# Patient Record
Sex: Female | Born: 1945 | Race: White | Hispanic: No | Marital: Married | Smoking: Never smoker
Health system: Southern US, Community
[De-identification: ages and names within clinical notes are randomized; demographics above are authoritative.]

## PROBLEM LIST (undated history)

## (undated) DIAGNOSIS — I482 Chronic atrial fibrillation, unspecified: Secondary | ICD-10-CM

## (undated) DIAGNOSIS — J9601 Acute respiratory failure with hypoxia: Secondary | ICD-10-CM

## (undated) DIAGNOSIS — M1990 Unspecified osteoarthritis, unspecified site: Secondary | ICD-10-CM

## (undated) DIAGNOSIS — Z8781 Personal history of (healed) traumatic fracture: Secondary | ICD-10-CM

## (undated) DIAGNOSIS — M75102 Unspecified rotator cuff tear or rupture of left shoulder, not specified as traumatic: Secondary | ICD-10-CM

## (undated) DIAGNOSIS — N200 Calculus of kidney: Secondary | ICD-10-CM

## (undated) DIAGNOSIS — M961 Postlaminectomy syndrome, not elsewhere classified: Secondary | ICD-10-CM

## (undated) DIAGNOSIS — N390 Urinary tract infection, site not specified: Secondary | ICD-10-CM

## (undated) DIAGNOSIS — R319 Hematuria, unspecified: Secondary | ICD-10-CM

## (undated) DIAGNOSIS — M51379 Other intervertebral disc degeneration, lumbosacral region without mention of lumbar back pain or lower extremity pain: Secondary | ICD-10-CM

## (undated) DIAGNOSIS — M810 Age-related osteoporosis without current pathological fracture: Secondary | ICD-10-CM

## (undated) DIAGNOSIS — E119 Type 2 diabetes mellitus without complications: Secondary | ICD-10-CM

## (undated) DIAGNOSIS — M75101 Unspecified rotator cuff tear or rupture of right shoulder, not specified as traumatic: Secondary | ICD-10-CM

## (undated) DIAGNOSIS — M48061 Spinal stenosis, lumbar region without neurogenic claudication: Secondary | ICD-10-CM

## (undated) DIAGNOSIS — I440 Atrioventricular block, first degree: Secondary | ICD-10-CM

## (undated) DIAGNOSIS — E669 Obesity, unspecified: Secondary | ICD-10-CM

## (undated) DIAGNOSIS — R0600 Dyspnea, unspecified: Secondary | ICD-10-CM

## (undated) DIAGNOSIS — M5137 Other intervertebral disc degeneration, lumbosacral region: Secondary | ICD-10-CM

## (undated) DIAGNOSIS — G5603 Carpal tunnel syndrome, bilateral upper limbs: Secondary | ICD-10-CM

## (undated) DIAGNOSIS — I5032 Chronic diastolic (congestive) heart failure: Secondary | ICD-10-CM

## (undated) DIAGNOSIS — Z87442 Personal history of urinary calculi: Secondary | ICD-10-CM

## (undated) DIAGNOSIS — N201 Calculus of ureter: Secondary | ICD-10-CM

## (undated) DIAGNOSIS — R7303 Prediabetes: Secondary | ICD-10-CM

## (undated) HISTORY — PX: FINGER ARTHROSCOPY WITH CARPOMETACARPEL (CMC) ARTHROPLASTY: SHX5629

## (undated) HISTORY — PX: KNEE ARTHROSCOPY: SUR90

## (undated) HISTORY — DX: Type 2 diabetes mellitus without complications: E11.9

## (undated) HISTORY — PX: VAGINAL HYSTERECTOMY: SUR661

## (undated) HISTORY — PX: CHOLECYSTECTOMY OPEN: SUR202

## (undated) HISTORY — PX: NASAL SEPTUM SURGERY: SHX37

## (undated) HISTORY — PX: BILATERAL CARPAL TUNNEL RELEASE: SHX6508

## (undated) HISTORY — PX: APPENDECTOMY: SHX54

## (undated) HISTORY — DX: Age-related osteoporosis without current pathological fracture: M81.0

## (undated) HISTORY — PX: SHOULDER OPEN ROTATOR CUFF REPAIR: SHX2407

## (undated) HISTORY — PX: LUMBAR LAMINECTOMY: SHX95

---

## 1999-01-23 ENCOUNTER — Other Ambulatory Visit: Admission: RE | Admit: 1999-01-23 | Discharge: 1999-01-23 | Payer: Self-pay | Admitting: Family Medicine

## 1999-06-02 ENCOUNTER — Encounter: Admission: RE | Admit: 1999-06-02 | Discharge: 1999-06-20 | Payer: Self-pay | Admitting: Orthopedic Surgery

## 2000-11-01 ENCOUNTER — Encounter: Payer: Self-pay | Admitting: Emergency Medicine

## 2000-11-01 ENCOUNTER — Emergency Department (HOSPITAL_COMMUNITY): Admission: EM | Admit: 2000-11-01 | Discharge: 2000-11-01 | Payer: Self-pay | Admitting: Emergency Medicine

## 2001-01-11 ENCOUNTER — Observation Stay (HOSPITAL_COMMUNITY): Admission: RE | Admit: 2001-01-11 | Discharge: 2001-01-13 | Payer: Self-pay | Admitting: Orthopedic Surgery

## 2001-02-04 ENCOUNTER — Encounter: Admission: RE | Admit: 2001-02-04 | Discharge: 2001-03-16 | Payer: Self-pay | Admitting: Orthopedic Surgery

## 2002-07-04 ENCOUNTER — Emergency Department (HOSPITAL_COMMUNITY): Admission: EM | Admit: 2002-07-04 | Discharge: 2002-07-04 | Payer: Self-pay | Admitting: Emergency Medicine

## 2002-07-04 ENCOUNTER — Encounter: Payer: Self-pay | Admitting: Emergency Medicine

## 2002-07-22 ENCOUNTER — Inpatient Hospital Stay (HOSPITAL_COMMUNITY): Admission: RE | Admit: 2002-07-22 | Discharge: 2002-07-23 | Payer: Self-pay | Admitting: Orthopedic Surgery

## 2004-01-22 ENCOUNTER — Ambulatory Visit (HOSPITAL_COMMUNITY): Admission: RE | Admit: 2004-01-22 | Discharge: 2004-01-22 | Payer: Self-pay | Admitting: Gastroenterology

## 2004-01-22 ENCOUNTER — Encounter (INDEPENDENT_AMBULATORY_CARE_PROVIDER_SITE_OTHER): Payer: Self-pay | Admitting: *Deleted

## 2007-10-09 ENCOUNTER — Emergency Department (HOSPITAL_COMMUNITY): Admission: EM | Admit: 2007-10-09 | Discharge: 2007-10-09 | Payer: Self-pay | Admitting: Emergency Medicine

## 2010-03-19 ENCOUNTER — Encounter
Admission: RE | Admit: 2010-03-19 | Discharge: 2010-03-19 | Payer: Self-pay | Admitting: Physical Medicine and Rehabilitation

## 2010-04-12 ENCOUNTER — Encounter
Admission: RE | Admit: 2010-04-12 | Discharge: 2010-04-12 | Payer: Self-pay | Admitting: Physical Medicine and Rehabilitation

## 2010-11-28 NOTE — Op Note (Signed)
NAME:  [name], [name] [facility]                   ACCOUNT NO.:  [id number]   MEDICAL RECORD NO.:  [id number]                   PATIENT TYPE:  AMB   LOCATION:  ENDO                                 FACILITY:  [facility]   PHYSICIAN:  Petra Kuba, M.D.                 DATE OF BIRTH:  [birth year]   DATE OF PROCEDURE:  01/22/2004  DATE OF DISCHARGE:                                 OPERATIVE REPORT   PROCEDURE:  Colonoscopy.   INDICATION:  Patient with abnormal CAT scan, postprandial diarrhea,  abdominal and back pain, currently doing better.  Due for colonic screening.  Consent was signed after risks, benefits, methods, options thoroughly  discussed in the office.   MEDICINES USED:  1. Demerol 120.  2. Versed 12.   PROCEDURE:  Rectal inspection is pertinent for external hemorrhoids.  Digital exam was negative.  Video colonoscope was inserted, fairly easily  advanced around the colon to the cecum.  This did require some abdominal  pressure but no position changes.  Other than some left-sided diverticula,  no abnormalities were seen.  The cecum was identified by the appendiceal  orifice and the ileocecal valve.  The rectoscope was inserted a short ways  into the terminal ileum which was normal.  Photo documentation was obtained.  A few random TI biopsies were obtained and put in the first container.  The  scope was slowly withdrawn.  The prep was adequate.  There was some liquid  stool that required washing and suctioning.  The scope was slowly withdrawn.  Random colon biopsies were obtained and put in the second container.  On  slow withdrawal through the colon other than the left-sided diverticular,  which were primarily in the sigmoid, no abnormalities were seen as we slowly  withdrew back to the rectum.  Once back in the rectum anorectal pull through  and retroflexion confirmed some small hemorrhoids.  The scope was  straightened and readvanced a short ways around the left side of the  colon,  air was suctioned and scope removed.  The patient tolerated the procedure  well.  There was no obvious immediate complication.   ENDOSCOPIC DIAGNOSES:  1. Internal and external hemorrhoids.  2. Left-sided diverticula.  3. Otherwise within normal limits to the terminal ileum, status post random     biopsies throughout.   PLAN:  1. Await pathology, but if okay would try some antispasmodics.  May give her     some Robinul or Pamine samples or try a generic like __________,     otherwise might consider Carafate or Questran for her post     cholecystectomy diarrhea.  2. Followup p.r.n. or in 6-8 weeks to recheck symptoms, see how above     medicines are working and decide any other workup compliance with     possibly a one-time upper GI small bowel follow through __________     antibodies, etc.  Petra Kuba, M.D.    MEM/MEDQ  D:  01/22/2004  T:  01/22/2004  Job:  [id number]   cc:   Veverly Fells. Vernie Ammons, M.D.  [address]  [place]  [place] [address]  Fax: [phone]

## 2010-11-28 NOTE — Op Note (Signed)
NAME:  JESSE, HIRST [facility]                   ACCOUNT NO.:  [id number]   MEDICAL RECORD NO.:  [id number]                   PATIENT TYPE:  OBV   LOCATION:  0479                                 FACILITY:  [facility]   PHYSICIAN:  Georges Lynch. Gioffre, M.D.             DATE OF BIRTH:  [birth year]   DATE OF PROCEDURE:  07/21/2002  DATE OF DISCHARGE:                                 OPERATIVE REPORT   PREOPERATIVE DIAGNOSES:  1. Plantar fasciitis with heel spur, right heel.  2. Complete tear of the right rotator cuff tendon, right shoulder.   POSTOPERATIVE DIAGNOSES:  1. Plantar fasciitis with heel spur, right heel.  2. Complete tear of the right rotator cuff tendon, right shoulder.   PROCEDURES:  1. Injection of the right heel with 1 cubic centimeters of Depo-Medrol, 5     cubic centimeters 0.5% Marcaine.  2. Repair of a complete tear of the right rotator cuff tendon utilizing a     graft jacket tendon graft and a partial acromionectomy on the right     shoulder.   SURGEON:  Georges Lynch. Darrelyn Hillock, M.D.   ASSISTANT:  Ebbie Ridge. Paitsel, P.A.   DESCRIPTION OF PROCEDURE:  Under general anesthesia, prior to the general  anesthesia the patient had an interscalene block done in the holding area by  anesthesia.  A routine orthopedic prep and draping of the right shoulder was  carried out.  At this time incision was made over the anterior aspect of the  right shoulder, bleeders identified and cauterized.  The incision was taken  down to the subcutaneous area, bleeders identified and cauterized.  I then  inserted the self-retaining retractors, and I detached the deltoid tendon by  sharp dissection from the acromion.  I then split the proximal portion of  the deltoid muscle in the usual fashion.  I advanced the retractors deeper  into the wound and then excised the subdeltoid bursa that was chronically  inflamed.  Immediately upon doing this, I noted she had a complete  disruption of her rotator cuff  tendon.  She had a severe impingement  syndrome secondary to downsloping of her acromion.  I protected the rotator  cuff, utilized the oscillating saw, and then did a partial acromionectomy.  I then excised the remaining part of the subdeltoid bursa.  Following this I  went down and utilized the bur to bur down the lateral articular surface of  the humerus.  I then utilized the graft jacket.  A double thickness of the  graft jacket was used to repair the large defect in the rotator cuff.  This  was anchored down with two Multi-Tak anchors.  We had a nice, solid repair.  Thoroughly irrigated out the area.  I then reapproximated the deltoid tendon  and the muscle in the usual fashion.  The skin was closed with metal  staples.  A sterile Neosporin dressing was applied.  She was placed in a  shoulder immobilizer.  She had 1 g of IV Ancef preop.                                               Ronald A. Darrelyn Hillock, M.D.    RAG/MEDQ  D:  07/21/2002  T:  07/21/2002  Job:  [id number]

## 2010-11-28 NOTE — Op Note (Signed)
[facility]  Patient:    [name], [name]                 MRN: [id number] Proc. Date: 01/11/01 Attending:  Georges Lynch. Darrelyn Hillock, M.D.                           Operative Report  SURGEON:  Georges Lynch. Darrelyn Hillock, M.D.  ASSISTANTJill Side Mahar, P.A.-C.  PREOPERATIVE DIAGNOSIS:  Complete tear of the rotator cuff tendon on the left.  POSTOPERATIVE DIAGNOSIS:  Complete tear of the rotator cuff tendon on the left.  OPERATIONS: 1. Partial acromionectomy and acromioplasty on the left. 2. Repair of a complete rotator cuff tendon tear on the left, utilizing four    multi-tack sutures.  DESCRIPTION OF PROCEDURE:  Under general anesthesia, routine orthopedic prepping and draping of the left shoulder was carried out.  The patient had  1 gram of IV Ancef.  At this time, an incision was made over the anterior aspect of the left shoulder, bleeders identified and cauterized.  I then inserted a self-retaining retractor.  I detached the deltoid tendon by sharp dissection from the acromion.  I then did a partial acromionectomy with the oscillating saw and then acromioplasty utilizing the bur, and I utilized the bone wax to wax the undersurface of the acromion.  At this time, I excised the subdeltoid bursa which was chronically inflamed.  I then noted that she had a complete tear like an orange-peel appearance of the rotator cuff from the humerus.  I thoroughly irrigated out the area utilizing the bur and made a nice trough for good bleeding bone over the lateral aspect of the humerus.  I then inserted four multi-tack sutures into the proximal humerus and sutured the tendon down in place.  I had to utilize the biceps tendon for reinforcement more medially for the tendon repair as well.  Following this, we irrigated the shoulder out. I injected 20 cc of 0.5% Marcaine and epinephrine into the shoulder.  I then reattached the deltoid tendon in the usual fashion with #1  Tycron.  The muscle was closed with 0 Vicryl, subcutaneous was closed with 0 Vicryl, skin was closed with metal staples, and sterile Neosporin dressing was applied.  She was placed in a shoulder immobilizer.  She was given 1 gram of IV Ancef preop. DD:  01/11/01 TD:  01/11/01 Job: [id number]

## 2011-02-11 ENCOUNTER — Other Ambulatory Visit: Payer: Self-pay | Admitting: Neurosurgery

## 2011-02-11 DIAGNOSIS — M549 Dorsalgia, unspecified: Secondary | ICD-10-CM

## 2011-02-11 DIAGNOSIS — M5410 Radiculopathy, site unspecified: Secondary | ICD-10-CM

## 2011-02-11 DIAGNOSIS — M542 Cervicalgia: Secondary | ICD-10-CM

## 2011-02-11 MED ORDER — DIAZEPAM 2 MG PO TABS: 10.0000 mg | ORAL_TABLET | Freq: Once | ORAL | Status: DC

## 2011-02-12 ENCOUNTER — Ambulatory Visit
Admission: RE | Admit: 2011-02-12 | Discharge: 2011-02-12 | Disposition: A | Discharge status: Home / Self Care | Payer: Medicare Other | Source: Ambulatory Visit | Attending: Neurosurgery | Admitting: Neurosurgery

## 2011-02-12 DIAGNOSIS — M542 Cervicalgia: Secondary | ICD-10-CM

## 2011-02-12 DIAGNOSIS — M5410 Radiculopathy, site unspecified: Secondary | ICD-10-CM

## 2011-02-12 DIAGNOSIS — M549 Dorsalgia, unspecified: Secondary | ICD-10-CM

## 2011-02-12 MED ORDER — SODIUM CHLORIDE 0.9 % IV SOLN: 4.0000 mg | Freq: Four times a day (QID) | INTRAVENOUS | Status: DC | PRN

## 2011-02-12 MED ORDER — IOHEXOL 300 MG/ML  SOLN
10.0000 mL | Freq: Once | INTRAMUSCULAR | Status: AC | PRN
  Administered 2011-02-12: 10 mL via INTRATHECAL

## 2011-02-12 MED ORDER — DIAZEPAM 2 MG PO TABS
5.0000 mg | ORAL_TABLET | Freq: Once | ORAL | Status: AC
  Administered 2011-02-12: 5 mg via ORAL

## 2011-02-12 MED ORDER — DIAZEPAM 2 MG PO TABS: 10.0000 mg | ORAL_TABLET | Freq: Once | ORAL | Status: DC

## 2011-02-12 MED ORDER — MEPERIDINE HCL 100 MG/ML IJ SOLN
75.0000 mg | Freq: Once | INTRAMUSCULAR | Status: AC
  Administered 2011-02-12: 75 mg via INTRAMUSCULAR

## 2011-02-12 MED ORDER — ONDANSETRON HCL 4 MG/2ML IJ SOLN
4.0000 mg | Freq: Once | INTRAMUSCULAR | Status: AC
  Administered 2011-02-12: 4 mg via INTRAMUSCULAR

## 2011-03-12 ENCOUNTER — Encounter (HOSPITAL_COMMUNITY)
Admission: RE | Admit: 2011-03-12 | Discharge: 2011-03-12 | Disposition: A | Discharge status: Home / Self Care | Payer: Medicare Other | Source: Ambulatory Visit | Attending: Neurosurgery | Admitting: Neurosurgery

## 2011-03-12 LAB — BASIC METABOLIC PANEL
CO2: 29 mEq/L (ref 19–32)
Calcium: 9.8 mg/dL (ref 8.4–10.5)
Glucose, Bld: 116 mg/dL — ABNORMAL HIGH (ref 70–99)
Sodium: 145 mEq/L (ref 135–145)

## 2011-03-12 LAB — SURGICAL PCR SCREEN: Staphylococcus aureus: NEGATIVE

## 2011-03-12 LAB — CBC
Hemoglobin: 12.5 g/dL (ref 12.0–15.0)
MCH: 29.3 pg (ref 26.0–34.0)
RBC: 4.26 MIL/uL (ref 3.87–5.11)

## 2011-03-14 DIAGNOSIS — Z8781 Personal history of (healed) traumatic fracture: Secondary | ICD-10-CM

## 2011-03-14 HISTORY — DX: Personal history of (healed) traumatic fracture: Z87.81

## 2011-03-18 ENCOUNTER — Inpatient Hospital Stay (HOSPITAL_COMMUNITY)
Admission: RE | Admit: 2011-03-18 | Discharge: 2011-03-31 | DRG: 472 | Disposition: A | Discharge status: Skilled Nursing Facility | Payer: Medicare Other | Source: Ambulatory Visit | Attending: Neurosurgery | Admitting: Neurosurgery

## 2011-03-18 ENCOUNTER — Inpatient Hospital Stay (HOSPITAL_COMMUNITY): Payer: Medicare Other

## 2011-03-18 DIAGNOSIS — S2249XA Multiple fractures of ribs, unspecified side, initial encounter for closed fracture: Secondary | ICD-10-CM | POA: Diagnosis not present

## 2011-03-18 DIAGNOSIS — M47812 Spondylosis without myelopathy or radiculopathy, cervical region: Principal | ICD-10-CM | POA: Diagnosis present

## 2011-03-18 DIAGNOSIS — K5900 Constipation, unspecified: Secondary | ICD-10-CM | POA: Diagnosis not present

## 2011-03-18 DIAGNOSIS — Z801 Family history of malignant neoplasm of trachea, bronchus and lung: Secondary | ICD-10-CM

## 2011-03-18 DIAGNOSIS — M51379 Other intervertebral disc degeneration, lumbosacral region without mention of lumbar back pain or lower extremity pain: Secondary | ICD-10-CM | POA: Diagnosis present

## 2011-03-18 DIAGNOSIS — J811 Chronic pulmonary edema: Secondary | ICD-10-CM | POA: Diagnosis not present

## 2011-03-18 DIAGNOSIS — Z01810 Encounter for preprocedural cardiovascular examination: Secondary | ICD-10-CM

## 2011-03-18 DIAGNOSIS — Z833 Family history of diabetes mellitus: Secondary | ICD-10-CM

## 2011-03-18 DIAGNOSIS — F411 Generalized anxiety disorder: Secondary | ICD-10-CM | POA: Diagnosis present

## 2011-03-18 DIAGNOSIS — Z8249 Family history of ischemic heart disease and other diseases of the circulatory system: Secondary | ICD-10-CM

## 2011-03-18 DIAGNOSIS — Y9210 Unspecified residential institution as the place of occurrence of the external cause: Secondary | ICD-10-CM | POA: Diagnosis not present

## 2011-03-18 DIAGNOSIS — E669 Obesity, unspecified: Secondary | ICD-10-CM | POA: Diagnosis present

## 2011-03-18 DIAGNOSIS — I440 Atrioventricular block, first degree: Secondary | ICD-10-CM | POA: Diagnosis present

## 2011-03-18 DIAGNOSIS — W1809XA Striking against other object with subsequent fall, initial encounter: Secondary | ICD-10-CM | POA: Diagnosis not present

## 2011-03-18 DIAGNOSIS — G8929 Other chronic pain: Secondary | ICD-10-CM | POA: Diagnosis present

## 2011-03-18 DIAGNOSIS — M5137 Other intervertebral disc degeneration, lumbosacral region: Secondary | ICD-10-CM | POA: Diagnosis present

## 2011-03-18 DIAGNOSIS — M5030 Other cervical disc degeneration, unspecified cervical region: Secondary | ICD-10-CM | POA: Diagnosis present

## 2011-03-18 DIAGNOSIS — R0789 Other chest pain: Secondary | ICD-10-CM | POA: Diagnosis not present

## 2011-03-18 DIAGNOSIS — Z01812 Encounter for preprocedural laboratory examination: Secondary | ICD-10-CM

## 2011-03-18 HISTORY — PX: ANTERIOR CERVICAL DECOMP/DISCECTOMY FUSION: SHX1161

## 2011-03-24 DIAGNOSIS — M5412 Radiculopathy, cervical region: Secondary | ICD-10-CM

## 2011-03-24 DIAGNOSIS — M47812 Spondylosis without myelopathy or radiculopathy, cervical region: Secondary | ICD-10-CM

## 2011-03-25 NOTE — Op Note (Signed)
NAME:  [name], [name] NO.:  [id number]  MEDICAL RECORD NO.:  [id number]  LOCATION:  3104                         FACILITY:  [facility]  PHYSICIAN:  Hilda Lias, M.D.   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  PROCEDURE:  Anterior C3-4, C4-5, C5-6, C6-7 diskectomy, decompression of the spinal cord, foraminotomy, interbody fusion with cages, plate, microscope.  SURGEON:  Hilda Lias, MD  ASSISTANT:  Danae Orleans. Venetia Maxon, MD  CLINICAL HISTORY:  [name] is a 65 years old female complaining of neck pain worsened to the both upper extremity associated with weakness of the deltoid and biceps.  Also, she has a back pain radiation to both lower extremities.  X-rays show severe spondylosis from C3-4 down to C6- 7.  Surgery was advised in view of improvement.  This problem had been going on for many years.  PROCEDURE IN DETAIL:  The patient was taken to the OR and after intubation, the left side of the neck was cleaned with DuraPrep.  Drapes were applied.  A longitudinal incision through the skin, subcutaneous tissue, platysma was carried out all the way down to the cervical spine. X-rays showed that the needle was at the level of C2-3 and the retractor at the level of C3-4.  From then on, we opened anterior ligament at C3- 4.  The ligament was calcified.  We brought the microscope into the area and using the drill as well as the microcurette, total diskectomy was done.  We opened the posterior ligament and decompressing the  spinal cord as well as the C4 nerve root.  The same finding was essentially at the level of C4-5 and C5-6 with quite a bit of degenerative disk disease and spondylosis.  Decompression of the spinal cord as well as the C5 and C6 nerve root was achieved.  At the level of C6-7 was the worse one. The patient had almost no space, and we had to do all the way posteriorly posterior ligament.  The ligament was calcified.  We debrided and using the 1 and 2 mm Kerrison punch, we decompressed the spinal cord.  We went laterally and we had to decompress the C7 nerve root, which was quite patent and flat.  At the end, we had good decompression at those four levels.  Cages of 7 mm with autograft and DBX, lordotic were inserted at the level of C3-4, C4-5, and C5-6, and at the level of C6-7 at 8 mm.  This was followed by plate using 10 screws.  Lateral cervical spine showed good position of the plate and graft at the level of C3-4, C4-5.  Because the shoulder, we were unable to see the lower 2 levels.  The area was irrigated and although we accomplished good hemostasis, we left a  drain.  Then, the wound was closed with Vicryl and Steri-Strips.          ______________________________ Hilda Lias, M.D.     EB/MEDQ  D:  03/18/2011  T:  03/18/2011  Job:  [id number]  Electronically Signed by Hilda Lias M.D. on 03/25/2011 07:10:14 PM

## 2011-03-25 NOTE — H&P (Signed)
[name], [name] NO.:  [id number]  MEDICAL RECORD NO.:  [id number]  LOCATION:  2899                         FACILITY:  [facility]  PHYSICIAN:  Hilda Lias, M.D.   DATE OF BIRTH:  1946-04-02  DATE OF ADMISSION:  03/18/2011 DATE OF DISCHARGE:                             HISTORY & PHYSICAL   CLINICAL HISTORY:  [name] is a lady who was seen by me initially about 2 months ago in my office with her husband complaining of neck pain radiating to both upper extremity associated with numbness and pain down to both legs, left worse than right one with a burning sensation of the left big toe.  Also, she noticed that when she stands the back pain gets worse.  When she walks, she had to sit after 2-3 blocks because the pain gets worse and it gets better with resting.  The patient has had cervical and lumbar myelogram and because of the findings, she is being admitted for decompression of the cervical spine first and later on with lumbar spine.  PAST MEDICAL HISTORY:  She had a lumbar surgery twice in the 60s.  She has surgery on both knees.  She has had shoulder surgery as well as cholecystectomy.  ALLERGIES:  She is not allergic to any medication.  SOCIAL HISTORY:  Negative.  She is 5 feet 2 inches and 240 pounds.  FAMILY HISTORY:  Positive for heart disease, cancer of the lung, and diabetes.  REVIEW OF SYSTEMS:  Positive for anxiety, kidney stone, back pain, leg pain, neck pain, and swelling of the feet.  PHYSICAL EXAMINATION:  GENERAL:  The patient came to my office and immediately when she tried to stand up, it was quite painful for her. HEAD, EYES, EARS, NOSE, AND THROAT:  Normal. NECK:  She has decrease of flexibility of the cervical spine. LUNGS:  There is some mild rhonchi bilaterally. CARDIOVASCULAR:  Normal. ABDOMEN:  Normal. EXTREMITIES:  Normal pulses. NEUROLOGIC:  She has weakness of the deltoid, biceps, and wrist extensors.  She has  weakness of dorsiflexion of both feet.  Sensation, she complains of numbness in both hands.  Reflexes are 1+.  No Babinski.  IMAGING:  The patient had a cervical myelogram which showed facet arthropathy at the level of C3-C4, C4-C5, C5-C6, and C6-C7 being C6-C7 the worse.  In the lumbar spine, she has a 3-level degenerative disk disease from L3 down to L5-S1.  CLINICAL IMPRESSION:  Cervical spondylosis with chronic radiculopathy. Lumbar degenerative disk disease at the level of L3-L4, L4-L5, and L5- L6.  RECOMMENDATIONS:  The patient is being admitted for surgery.  We are going to proceed with decompression at the level of C3-C4, C4-C5, C5-C6, and C6-C7 using either allograft or cages.  This will be followed by a plate. The risks were fully explained to her and her husband including possibility of infection, CSF leak, and paralysis.  She is fully aware that this problem will not resolve the problem of the lumbar spine which might require surgery at a different date.          ______________________________ Hilda Lias, M.D. EB/MEDQ  D:  03/18/2011  T:  03/18/2011  Job:  [id number]  Electronically Signed by Hilda Lias M.D. on 03/25/2011 07:10:07 PM

## 2011-03-28 ENCOUNTER — Inpatient Hospital Stay (HOSPITAL_COMMUNITY): Payer: Medicare Other

## 2011-03-28 ENCOUNTER — Encounter (HOSPITAL_COMMUNITY): Payer: Self-pay | Admitting: Radiology

## 2011-03-28 LAB — CBC
HCT: 34.4 % — ABNORMAL LOW (ref 36.0–46.0)
Hemoglobin: 11.2 g/dL — ABNORMAL LOW (ref 12.0–15.0)
WBC: 13.7 10*3/uL — ABNORMAL HIGH (ref 4.0–10.5)

## 2011-03-28 LAB — BASIC METABOLIC PANEL
CO2: 32 mEq/L (ref 19–32)
Glucose, Bld: 158 mg/dL — ABNORMAL HIGH (ref 70–99)
Potassium: 3.9 mEq/L (ref 3.5–5.1)
Sodium: 139 mEq/L (ref 135–145)

## 2011-03-28 LAB — D-DIMER, QUANTITATIVE: D-Dimer, Quant: 3.89 ug/mL-FEU — ABNORMAL HIGH (ref 0.00–0.48)

## 2011-03-28 LAB — HEPATIC FUNCTION PANEL
Albumin: 3.3 g/dL — ABNORMAL LOW (ref 3.5–5.2)
Total Protein: 7.2 g/dL (ref 6.0–8.3)

## 2011-03-28 LAB — CARDIAC PANEL(CRET KIN+CKTOT+MB+TROPI)
CK, MB: 1.6 ng/mL (ref 0.3–4.0)
Relative Index: INVALID (ref 0.0–2.5)
Troponin I: <0.3 ng/mL (ref <0.30)

## 2011-03-28 MED ORDER — IOHEXOL 300 MG/ML  SOLN
100.0000 mL | Freq: Once | INTRAMUSCULAR | Status: AC | PRN
  Administered 2011-03-28: 100 mL via INTRAVENOUS

## 2011-03-29 HISTORY — PX: TRANSTHORACIC ECHOCARDIOGRAM: SHX275

## 2011-03-29 LAB — CARDIAC PANEL(CRET KIN+CKTOT+MB+TROPI)
CK, MB: 1.7 ng/mL (ref 0.3–4.0)
Troponin I: <0.3 ng/mL (ref <0.30)
Troponin I: <0.3 ng/mL (ref <0.30)

## 2011-03-30 ENCOUNTER — Inpatient Hospital Stay (HOSPITAL_COMMUNITY): Payer: Medicare Other

## 2011-03-30 LAB — URINE MICROSCOPIC-ADD ON

## 2011-03-30 LAB — URINALYSIS, ROUTINE W REFLEX MICROSCOPIC
Bilirubin Urine: NEGATIVE
Nitrite: NEGATIVE
Specific Gravity, Urine: 1.021 (ref 1.005–1.030)
pH: 5 (ref 5.0–8.0)

## 2011-03-30 LAB — BASIC METABOLIC PANEL
BUN: 11 mg/dL (ref 6–23)
Chloride: 96 mEq/L (ref 96–112)
GFR calc Af Amer: >60 mL/min (ref >60)
Potassium: 3.7 mEq/L (ref 3.5–5.1)
Sodium: 137 mEq/L (ref 135–145)

## 2011-03-31 LAB — BASIC METABOLIC PANEL
Calcium: 9 mg/dL (ref 8.4–10.5)
Creatinine, Ser: 0.66 mg/dL (ref 0.50–1.10)
GFR calc non Af Amer: >60 mL/min (ref >60)
Sodium: 139 mEq/L (ref 135–145)

## 2011-03-31 LAB — CBC
MCH: 30.1 pg (ref 26.0–34.0)
MCHC: 33.6 g/dL (ref 30.0–36.0)
MCV: 89.9 fL (ref 78.0–100.0)
Platelets: 288 10*3/uL (ref 150–400)
RDW: 13.2 % (ref 11.5–15.5)

## 2011-04-06 LAB — DIFFERENTIAL
Basophils Absolute: 0
Basophils Relative: 0
Eosinophils Absolute: 0
Monocytes Relative: 1 — ABNORMAL LOW
Neutro Abs: 13.2 — ABNORMAL HIGH
Neutrophils Relative %: 96 — ABNORMAL HIGH

## 2011-04-06 LAB — COMPREHENSIVE METABOLIC PANEL
ALT: 35
Alkaline Phosphatase: 109
BUN: 18
CO2: 24
GFR calc non Af Amer: >60
Glucose, Bld: 139 — ABNORMAL HIGH
Potassium: 3.5
Sodium: 140
Total Protein: 6.9

## 2011-04-06 LAB — URINALYSIS, ROUTINE W REFLEX MICROSCOPIC
Ketones, ur: >80 — AB
Leukocytes, UA: NEGATIVE
Protein, ur: NEGATIVE
Urobilinogen, UA: 0.2

## 2011-04-06 LAB — CBC
HCT: 36.6
Hemoglobin: 12.9
MCHC: 35.4
RBC: 4.09
RDW: 12.4

## 2011-04-06 LAB — URINE MICROSCOPIC-ADD ON

## 2011-04-10 NOTE — Consult Note (Signed)
[name], [name]:  [id number]  MEDICAL RECORD NO.:  [id number]  LOCATION:  3025                         FACILITY:  [facility]  PHYSICIAN:  Jake Bathe, MD      DATE OF BIRTH:  [birth year]  DATE OF CONSULTATION:  03/31/2011 DATE OF DISCHARGE:                                CONSULTATION   REQUESTING PHYSICIAN:  Danae Orleans. Venetia Maxon, MD of Neurosurgery.  REASON FOR CONSULTATION:  Evaluation of abnormal EKG and palpitations.  HISTORY OF PRESENT ILLNESS:  [name] is a 65 year old female who has no prior cardiovascular history, recently underwent cervical spine surgery on March 19, 2011, who was in a normal postoperative course when she stated that she had some chest wall discomfort earlier this morning.  She does not remember stating that she had chest discomfort, but it is notated in the [facility] note.  The EKG was obtained, which showed T-wave inversion, most prominent in V1, V2, V3, and subtly in V4, V5.  She also had subtle T-wave inversion in 1 and aVL.  When compared to prior EKG from March 11, 2007, these T-wave inversions were present, but this EKG shows more prominence especially in 1 and aVL and V5.  She has not had any prior cardiovascular evaluation, no prior stress test,  no prior echocardiogram.  She does state that for over the past year though she has had palpitations or she feels her heart rates at times and she takes her pulse and then they seem to improve.  This can occur while sitting.  She has no prior history of syncope or dizziness.  Currently, she is chest painfree without any complaints.  She does have a family history of coronary artery disease with her brother having bypass surgery in his 36s and her mother also having bypass surgery in her 81s as well.  Her father died of lung cancer.  PAST MEDICAL HISTORY: 1. Lumbar spine surgery twice in the 60s. 2. Surgery on both knees. 3. Shoulder surgery. 4.  Cholecystectomy.  ALLERGIES:  No known drug allergies.  SOCIAL HISTORY:  Denies any smoking, alcohol, or drug use.  She is married for the past 30 years.  FAMILY HISTORY:  As described above.  Positive for CAD.  MEDICATIONS:  No prior cardiac medications noted.  No prior aspirin.  REVIEW OF SYSTEMS:  Positive for neck pain, right-sided rib pain according to the nursing chart, constipation, leg swelling minor. Unless specified above, all other 12 review of systems negative.  PHYSICAL EXAMINATION:  GENERAL:  She is soft spoken in no acute distress, obese, sitting comfortably in her chair. EYES:  Well-perfused conjunctivae.  EOMI.  No scleral icterus. NECK:  Cervical surgery scar noted left side of the neck.  No bruits appreciated. LUNGS:  Clear to auscultation bilaterally.  Normal respiratory effort. No wheezes.  No rales. CARDIOVASCULAR:  Regular rate and rhythm without any appreciable rubs or murmurs detected. ABDOMEN:  Obese.  Positive bowel sounds.  No bruits. EXTREMITIES:  Trace edema bilateral lower extremities, palpable distal pulses. GU:  Deferred. RECTAL:  Deferred. NEUROLOGIC:  Cranial nerves II through XII grossly intact.  DATA:  Echocardiogram from March 29, 2011,  demonstrates moderate left ventricular hypertrophy, ejection fraction of 60-65%, mild mitral regurgitation and a small pericardial effusion, which appears to be circumferential with no signs of tamponade physiology.  EKG as described above, chronic ST-segment changes slightly accentuated.  Cardiac markers from March 29, 2011, are negative.  CT angiogram of the chest on March 28, 2011, showed no pulmonary emboli.  I personally reviewed the CT scan and she does not have any gross coronary artery calcifications noted.  Potassium this morning 3.2, creatinine 0.6.  BNP was 129, mildly elevated on March 28, 2011.  ASSESSMENT AND PLAN:  A 65 year old female with no prior cardiovascular history  with abnormal EKG with T-wave inversion most notably in the precordial leads, which seems to be chronic from prior EKG in 2008, with normal cardiac markers, mild pericardial effusion with atypical chest pain earlier, which she does not recall having.  Abnormal EKG/chest pain - currently chest painfree.  EKG appears to be somewhat chronic with precordial T-wave inversions noted.  New changes, however, are subtle ST-segment changes in 1 and aVL.  Given these changes and her family history of coronary artery disease as described above, I will go ahead and proceed with outpatient nuclear stress test, Lexiscan for further illustration of possible ischemia.  I think this will be helpful for further risk stratification especially with upcoming back surgery, which she told me about.  We will go ahead and get this arranged for her.  Once again, this can be done as an outpatient and I do feel comfortable with allowing her to be discharged from the hospital based on these findings.     Jake Bathe, MD     MCS/MEDQ  D:  03/31/2011  T:  03/31/2011  Job:  [id number]  cc:   Danae Orleans. Venetia Maxon, M.D.  Electronically Signed by Donato Schultz MD on 04/10/2011 06:48:21 AM

## 2011-04-17 NOTE — Discharge Summary (Signed)
  [name], [name] NO.:  [id number]  MEDICAL RECORD NO.:  [id number]  LOCATION:  3025                         FACILITY:  [facility]  PHYSICIAN:  Hilda Lias, M.D.   DATE OF BIRTH:  [birth year]  DATE OF ADMISSION:  03/18/2011 DATE OF DISCHARGE:                              DISCHARGE SUMMARY   ADMISSION DIAGNOSES: 1. Cervical degenerative disk disease at cervical 3-4, cervical 4-5,     cervical 5-6, cervical 6-7. 2. Degenerative disk disease, lumbar spine with radiculopathy status     post bilateral knee surgery, bilateral shoulder surgery. 3. Obesity.  FINAL DIAGNOSES: 1. Cervical degenerative disk disease at cervical 3-4, cervical 4-5,     cervical 5-6, cervical 6-7. 2. Degenerative disk disease, lumbar spine with radiculopathy status     post bilateral knee surgery, bilateral shoulder surgery. 3. Obesity.  CLINICAL HISTORY:  Ms. Mance was admitted because of neck pain worsened to both upper extremities, also she has a back pain worsened in both legs.  X-ray showed severe degenerative disk disease in the cervical spine from cervical 3 to 7.  The lumbar spine showed degenerative disk disease from lumbar 3 to S1.  The patient is being admitted to decompression of the cervical area and later on we will approach the lumbar area.  Laboratory within normal limits.  COURSE IN THE HOSPITAL:  The patient was taken to Surgery on March 18, 2011, and decompression of the cervical 3 to 7 was done.  After surgery, the patient had a drain which was removed 48 hours later.  She had been complaining of neck pain, shoulder pain and her main handicaps in the postop period had been lower back pain.  The patient has anxiety attack and she had been taking Xanax.  Today, she is feeling much better.  She lives at home with her husband and he is quite busy working almost 6 days a week.  Because of that she is going to be transferred tomorrow for a short-term admission to  one of the local nursing facility.  The husband went to see the nursing home this morning and he agreed with the selection.  CONDITION ON DISCHARGE:  Improvement in neck wise.  MEDICATIONS:  She is going to be taking Percocet, Flexeril and Xanax. She will be taking the previous medication.  DIET:  Regular although she was advised to lose weight.  FOLLOWUP:  She is going to be seen by me in my office in 3 or 4 weeks or any time as needed.          ______________________________ Hilda Lias, M.D.     EB/MEDQ  D:  03/27/2011  T:  03/28/2011  Job:  [id number]  Electronically Signed by Hilda Lias M.D. on 04/17/2011 06:17:15 PM

## 2011-04-21 NOTE — Consult Note (Signed)
[name], [name] NO.:  [id number]  MEDICAL RECORD NO.:  [id number]  LOCATION:                                 FACILITY:  PHYSICIAN:  Mauro Kaufmann, MD         DATE OF BIRTH:  [birth year]  DATE OF CONSULTATION: DATE OF DISCHARGE:                                CONSULTATION   REQUESTING PHYSICIAN:  Hilda Lias, MD  CHIEF COMPLAINT:  Chest pain.  HISTORY OF PRESENT ILLNESS:  This is a 65 year old female who has no significant medical problems who came to the hospital on March 18, 2011, for the C3-C4, C4-C5, C5-C6, C6-7 diskectomy and decompression of spinal cord.  The patient had surgery done on March 19, 2011, and the patient was receiving physical therapy in the hospital.  The plan was to discharge the patient to SNF as of today, but the patient fell last night and hurting her right side of the chest under her breast and since then the patient has been having severe chest pain.  Also, the patient has developed some wheezing.  The Neurosurgery has called Hospitalist Service to see the patient to assist with the further medical management.  The patient at this time complains of severe chest pain which is worse on deep breathing which is located mainly in the right side under her breast and it is also very tender to palpation.  The patient says that she has developed mild shortness of breath and wheezing.  She denies any previous history of COPD or emphysema.  The patient is nonsmoker.  There is no history of alcohol abuse, no history of illicit drug abuse in the past.  She does not have any substernal chest pain.  There is no history of nausea or vomiting, but she is constipated.  No abdominal pain.  The patient was on multiple sedating medications which have been discontinued by Dr. Jeral Fruit including the Valium and gabapentin.  The patient also has been on opiates.  The dose has been reduced by Dr. Jeral Fruit.  PAST MEDICAL HISTORY:  Significant  for: 1. Lumbar surgery twice. 2. She has a history of rotator cuff injury.  For that, she had     surgery. 3. Cholecystectomy.  ALLERGIES:  No allergies to medications.  SOCIAL HISTORY:  The patient denies any tobacco abuse.  No history of alcohol abuse.  No history of illicit drug abuse.  FAMILY HISTORY:  Significant for heart disease in both the patient's father and brother.  REVIEW OF SYSTEMS:  HEENT:  There is no headache, no blurred vision. NECK:  No history of thyroid problems.  CHEST:  As in the HPI.  HEART: As in the HPI.  GI:  No nausea or vomiting, but positive constipation. NEUROLOGICAL:  No history of stroke or TIA in the past.  PHYSICAL EXAMINATION:  VITAL SIGNS:  The patient's blood pressure is 132/64, respirations 18, pulse 100, temperature is 98.4, and O2 sat 96% on room air. HEENT:  Head is atraumatic and normocephalic.  Eyes, extraocular muscles are intact .  Oral mucosa is pink and moist. NECK:  Supple.  Chest is bilateral wheezing auscultated. HEART:  S1 and S2,  is regular in rate and rhythm. CHEST:  Chest wall, there is positive tenderness under the right breast, but no visible injury noted. ABDOMEN:  Soft.  Mildly distended, but nontender to palpation. EXTREMITIES:  Trace edema noted bilaterally. NEUROLOGICAL:  Cranial nerves II-XII grossly intact.  The patient is moving all the 4 extremities.  No other focal deficit noted at this time.  IMAGING STUDIES DONE IN THE HOSPITAL:  The patient had a chest x-ray done on March 28, 2011, which showed lung volume with bibasilar atelectasis, no acute chest findings seen.  ASSESSMENT: 1. Right-sided chest pain, status post fall. 2. Mild pulmonary edema versus bronchitis. 3. Questionable pulmonary embolism.  PLAN:  The patient will be started on Flexeril for muscle pain and she can continue taking the Percocet p.r.n.  I am going to check the rib x- ray to rule out any underlying hip fracture.  The patient  also began on a trial of Lasix of 40 mg p.o. x1 as the chest x-ray does show increased interstitial markings.  I am also going to check a BNP and a 2-D echo as the patient received IV fluids in the hospital which could have contributed to the patient's development of the wheezing.  If she does not have pulmonary edema, then we will have to look for infectious cause like bronchitis.  The chest x-ray done so far shows no pneumonia at this time.  Also, I am going to check a D-dimer to rule out any pulmonary embolism and we will also check the cardiac enzymes and EKG to rule out any underlying cardiac issues.  The patient also has been started on MiraLax and Senokot to help with constipation.  Thank you for the consult.  We will be happy to follow the patient along with you.  Total time spent on this consultation is 45 minutes.     Mauro Kaufmann, MD     GL/MEDQ  D:  03/28/2011  T:  03/28/2011  Job:  [id number]  Electronically Signed by Mauro Kaufmann  on 04/21/2011 10:10:31 AM

## 2011-05-13 ENCOUNTER — Inpatient Hospital Stay (HOSPITAL_COMMUNITY): Admission: RE | Admit: 2011-05-13 | Payer: Medicare Other | Source: Ambulatory Visit | Admitting: Neurosurgery

## 2012-01-21 ENCOUNTER — Other Ambulatory Visit: Payer: Self-pay | Admitting: Orthopedic Surgery

## 2012-01-21 MED ORDER — DEXAMETHASONE SODIUM PHOSPHATE 10 MG/ML IJ SOLN: 10.0000 mg | Freq: Once | INTRAMUSCULAR | Status: DC

## 2012-01-21 MED ORDER — BUPIVACAINE 0.25 % ON-Q PUMP SINGLE CATH 300ML: 300.0000 mL | INJECTION | Status: DC

## 2012-01-21 NOTE — Progress Notes (Signed)
Preoperative surgical orders have been place into the Epic hospital system for [name] on 01/21/2012, 2:00 PM  by Patrica Duel for surgery on 04/13/2012.  Preop Total Knee orders including Bupivacaine On-Q pump, IV Tylenol, and IV Decadron as long as there are no contraindications to the above medications. [name], PA-C

## 2012-03-31 ENCOUNTER — Encounter (HOSPITAL_COMMUNITY): Payer: Self-pay | Admitting: Pharmacy Technician

## 2012-04-06 ENCOUNTER — Encounter (HOSPITAL_COMMUNITY): Payer: Self-pay

## 2012-04-06 ENCOUNTER — Encounter (HOSPITAL_COMMUNITY)
Admission: RE | Admit: 2012-04-06 | Discharge: 2012-04-06 | Disposition: A | Discharge status: Home / Self Care | Payer: Medicare Other | Source: Ambulatory Visit | Attending: Orthopedic Surgery | Admitting: Orthopedic Surgery

## 2012-04-06 HISTORY — DX: Unspecified osteoarthritis, unspecified site: M19.90

## 2012-04-06 LAB — COMPREHENSIVE METABOLIC PANEL
ALT: 20 U/L (ref 0–35)
Alkaline Phosphatase: 115 U/L (ref 39–117)
BUN: 18 mg/dL (ref 6–23)
CO2: 28 mEq/L (ref 19–32)
Calcium: 9.3 mg/dL (ref 8.4–10.5)
GFR calc Af Amer: >90 mL/min (ref >90)
GFR calc non Af Amer: 84 mL/min — ABNORMAL LOW (ref >90)
Glucose, Bld: 97 mg/dL (ref 70–99)
Potassium: 4 mEq/L (ref 3.5–5.1)
Sodium: 141 mEq/L (ref 135–145)
Total Protein: 7.4 g/dL (ref 6.0–8.3)

## 2012-04-06 LAB — URINE MICROSCOPIC-ADD ON

## 2012-04-06 LAB — URINALYSIS, ROUTINE W REFLEX MICROSCOPIC
Glucose, UA: NEGATIVE mg/dL
Specific Gravity, Urine: 1.01 (ref 1.005–1.030)
pH: 6.5 (ref 5.0–8.0)

## 2012-04-06 LAB — PROTIME-INR: Prothrombin Time: 13.4 seconds (ref 11.6–15.2)

## 2012-04-06 LAB — CBC
HCT: 37.5 % (ref 36.0–46.0)
Hemoglobin: 12.7 g/dL (ref 12.0–15.0)
MCH: 29.7 pg (ref 26.0–34.0)
MCHC: 33.9 g/dL (ref 30.0–36.0)
RBC: 4.28 MIL/uL (ref 3.87–5.11)

## 2012-04-06 NOTE — Progress Notes (Signed)
EKG 02/04/2012 and CXR from 02/04/2012 from Dr. Jeannetta Nap office

## 2012-04-06 NOTE — Progress Notes (Deleted)
Please enter orders for patient in EPIC! Thank you!

## 2012-04-06 NOTE — Patient Instructions (Addendum)
20      Your procedure is scheduled on:  Wednesday 04/13/2012 at 1230 pm  Report to [facility] at  1000 AM.  Call this number if you have problems the morning of surgery: [phone]   Remember:   Do not eat food or drink liquids after midnight!  Take these medicines the morning of surgery with A SIP OF WATER: Hydrocodone-acetaminophen   Do not bring valuables to the hospital.  .  Leave suitcase in the car. After surgery it may be brought to your room.  For patients admitted to the hospital, checkout time is 11:00 AM the day of              Discharge.    Special Instructions: See [facility] Preparing  For Surgery Instruction Sheet. Do not wear jewelry, lotions powders, perfumes. Women do not shave legs or underarms for 12 hours before showers. Contacts, partial plates, or dentures may not be worn into surgery.                          Patients discharged the day of surgery will not be allowed to drive home.  If going home the same day of surgery, must have someone stay with you  first 24 hrs.at home and arrange for someone to drive you home from the              Hospital.   Please read over the following fact sheets that you were given: MRSA              INFORMATION, Sleep Apnea Sheet, Incentive Spirometry Sheet, Blood Transfusion Sheet               Telford Nab.Berthold Glace,RN,BSN [phone]

## 2012-04-11 ENCOUNTER — Other Ambulatory Visit: Payer: Self-pay | Admitting: Orthopedic Surgery

## 2012-04-11 NOTE — H&P (Signed)
[name]  DOB: 21-Jun-1946 Married / Language: English / Race: White Female  Date of Admission: 04/13/2012  Chief Complaint:  Left Knee Pain  History of Present Illness The patient is a 66 year old female who comes in for a preoperative History and Physical. The patient is scheduled for a left total knee arthroplasty to be performed by Dr. Gus Rankin. Aluisio, MD at [facility] on 04/13/2012. The patient is a 66 year old female who presents with knee complaints. The patient is seen today in referral from [facility]. The patient reports left knee and right knee symptoms including: pain (posterior pain that radiates into her ankles) which began 5 year(s) ago without any known injury (Patient states that she has pain in both knees. ). Symptoms are exacerbated by walking. She states the knee is getting progressively worse over time. She also has significant lower back problems. She has spinal stenosis and Dr. Jeral Fruit said she may end up needing surgery for that. The main complaint is severe pain in the left worse than right knee. She cannot walk more than a block or so without it getting worse. She is not getting the classic stenosis symptoms but is getting more symptoms consistent with pain from her knees. The right knee hurts also but not as bad as the left. She does get swelling. The knee gives out on her at times. This is definitely limiting what she can and cannot do. She wants to be more active and get back in shape and cannot do so because of the knees. She recently also had a cervical decompression and fusion from Dr. Jeral Fruit. She said that she is doing well with that. They have been treated conservatively in the past for the above stated problem and despite conservative measures, they continue to have progressive pain and severe functional limitations and dysfunction. They have failed non-operative management. It is felt that they would benefit from undergoing total  joint replacement. Risks and benefits of the procedure have been discussed with the patient and they elect to proceed with surgery. There are no active contraindications to surgery such as ongoing infection or rapidly progressive neurological disease.   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 11/16/1996 Enthesopathy, hip (726.5). 10/22/1997 Epicondylitis, lateral (726.32). 10/17/1998 Disorder, shoulder region NEC (726.2). 09/10/2000 Spur, calcaneal (726.73). 07/21/2002 Sprain/strain, rotator cuff (840.4). 11/02/2002 Sprain/strain, ankle NOS (845.00). 08/10/2005 Osteoarthrosis NOS, lower leg (715.96). 08/18/2005 Tear, lateral meniscus, knee, current (836.1). 08/18/2005 Derangement, medial meniscus other & unspc (717.3). 06/27/2009 Lumbago (724.2). 03/10/2010 Osteoarthrosis NOS, other spec site (715.98). 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 03/10/2010   Allergies Ultram *ANALGESICS - OPIOID*   Family History Cancer. mother and father Diabetes Mellitus. mother Heart Disease. mother Hypertension. mother Osteoarthritis. mother and grandmother mothers side   Social History Tobacco use. Never smoker. never smoker No alcohol use Alcohol use. never consumed alcohol Children. 2 Current work status. retired Financial planner (Currently). no Exercise. Exercises never Illicit drug use. no Living situation. live with spouse Marital status. married Number of flights of stairs before winded. less than 1 Pain Contract. no Previously in rehab. no Tobacco / smoke exposure. no Post-Surgical Plans. Plan is to go home. Advance Directives. Living Will   Pregnancy / Birth History Pregnant. no   Past Surgical History Arthroscopy of Knee.  bilateral Gallbladder Surgery. open Hysterectomy. partial (non-cancerous) Neck Disc Surgery Rotator Cuff Repair. bilateral Spinal Decompression.  neck and lower back Spinal Surgery Straighten Nasal Septum   Medical History Chronic Pain Kidney Stone Osteoarthritis Tinnitus Measles Obesity   Review of Systems General:Present- Fatigue. Not Present- Chills, Fever, Night Sweats, Appetite Loss, Feeling sick, Weight Gain and Weight Loss. Skin:Not Present- Itching, Rash, Skin Color Changes, Ulcer, Psoriasis and Change in Hair or Nails. HEENT:Not Present- Sensitivity to light, Nose Bleed, Visual Loss, Decreased Hearing and Ringing in the Ears. Neck:Not Present- Swollen Glands and Neck Mass. Respiratory:Not Present- Shortness of breath, Snoring, Chronic Cough and Bloody sputum. Cardiovascular:Not Present- Shortness of Breath, Chest Pain, Swelling of Extremities, Leg Cramps and Palpitations. Gastrointestinal:Not Present- Bloody Stool, Heartburn, Abdominal Pain, Vomiting, Nausea and Incontinence of Stool. Female Genitourinary:Not Present- Blood in Urine, Irregular/missing periods, Frequency, Incontinence and Nocturia. Musculoskeletal:Present- Muscle Weakness, Muscle Pain, Joint Stiffness, Joint Pain and Back Pain. Not Present- Joint Swelling. Neurological:Not Present- Tingling, Numbness, Burning, Tremor, Headaches and Dizziness. Psychiatric:Not Present- Anxiety, Depression and Memory Loss. Endocrine:Not Present- Cold Intolerance, Heat Intolerance, Excessive hunger and Excessive Thirst. Hematology:Not Present- Abnormal Bleeding, Abnormal bruising, Anemia and Blood Clots.   Vitals Weight: 225 lb Height: 61 in Body Surface Area: 2.1 m Body Mass Index: 42.51 kg/m Pulse: 64 (Regular) Resp.: 14 (Unlabored) BP: 146/88 (Sitting, Right Arm, Standard)   Physical Exam The physical exam findings are as follows:   General Mental Status - Alert, cooperative  and good historian. General Appearance- pleasant. Not in acute distress. Orientation- Oriented X3. Build & Nutrition- Well nourished and Well developed.   Head and Neck Head- normocephalic, atraumatic . Neck Global Assessment- supple. no bruit auscultated on the right and no bruit auscultated on the left.   Eye Pupil- Bilateral- Regular and Round. Motion- Bilateral- EOMI.   Chest and Lung Exam Auscultation: Breath sounds:- clear at anterior chest wall and - clear at posterior chest wall. Adventitious sounds:- No Adventitious sounds.   Cardiovascular Auscultation:Rhythm- Regular rate and rhythm. Heart Sounds- S1 WNL and S2 WNL. Murmurs & Other Heart Sounds:Auscultation of the heart reveals - No Murmurs.   Abdomen Inspection:Contour- Generalized moderate distention. Palpation/Percussion:Tenderness- Abdomen is non-tender to palpation. Rigidity (guarding)- Abdomen is soft. Auscultation:Auscultation of the abdomen reveals - Bowel sounds normal.   Female Genitourinary Not done, not pertinent to present illness  Musculoskeletal On exam, she is a well developed female alert and oriented in no apparent distress. Both hips show normal range of motion with no discomfort. The left knee shows a slight valgus deformity. Range is 5-125. There is marked crepitus on range of motion. She is tender lateral greater than medial. There is no instability. Right knee no effusion. Range 5-130. Slight crepitus on range of motion. Tender medial greater than lateral. Slight varus. No instability. Pulse, sensation, and motor are intact both lower extremities.  RADIOGRAPHS: AP both knees and lateral show advanced end stage arthritis of both knees, left worse than right. Slight valgus on the left with bone on bone lateral and patellofemoral and tibial subluxation. Right knee shows bone on bone medial and patellofemoral but the arthritis overall is worse on the left  than right.  Assessment & Plan Osteoarthrosis NOS, lower leg (715.96) Impression: Left Knee  Patient is for a left total knee replacement by Dr. Lequita Halt.  Plan is to go home.  Patinet states during her H&P that she has had some anxiety prior to her surgery due to complications that occurred after her neck surgery postoperatively.  PCP - Dr. Jeannetta Nap - Patient has been seen preoperatively and felt to be stable for surgery.  Signed electronically by Roberts Gaudy, PA-C

## 2012-04-13 ENCOUNTER — Inpatient Hospital Stay (HOSPITAL_COMMUNITY)
Admission: RE | Admit: 2012-04-13 | Discharge: 2012-04-16 | DRG: 470 | Disposition: A | Discharge status: Home Health | Payer: Medicare Other | Source: Ambulatory Visit | Attending: Orthopedic Surgery | Admitting: Orthopedic Surgery

## 2012-04-13 ENCOUNTER — Other Ambulatory Visit: Payer: Self-pay | Admitting: Orthopedic Surgery

## 2012-04-13 ENCOUNTER — Inpatient Hospital Stay (HOSPITAL_COMMUNITY): Payer: Medicare Other | Admitting: Anesthesiology

## 2012-04-13 ENCOUNTER — Encounter (HOSPITAL_COMMUNITY): Payer: Self-pay | Admitting: *Deleted

## 2012-04-13 ENCOUNTER — Encounter (HOSPITAL_COMMUNITY): Payer: Self-pay | Admitting: Anesthesiology

## 2012-04-13 ENCOUNTER — Encounter (HOSPITAL_COMMUNITY)
Admission: RE | Disposition: A | Discharge status: Home Health | Payer: Self-pay | Source: Ambulatory Visit | Attending: Orthopedic Surgery

## 2012-04-13 DIAGNOSIS — R319 Hematuria, unspecified: Secondary | ICD-10-CM

## 2012-04-13 DIAGNOSIS — Z6841 Body Mass Index (BMI) 40.0 and over, adult: Secondary | ICD-10-CM

## 2012-04-13 DIAGNOSIS — Z01812 Encounter for preprocedural laboratory examination: Secondary | ICD-10-CM

## 2012-04-13 DIAGNOSIS — G8929 Other chronic pain: Secondary | ICD-10-CM | POA: Diagnosis present

## 2012-04-13 DIAGNOSIS — Z90710 Acquired absence of both cervix and uterus: Secondary | ICD-10-CM

## 2012-04-13 DIAGNOSIS — M1710 Unilateral primary osteoarthritis, unspecified knee: Principal | ICD-10-CM | POA: Diagnosis present

## 2012-04-13 DIAGNOSIS — Z886 Allergy status to analgesic agent status: Secondary | ICD-10-CM

## 2012-04-13 DIAGNOSIS — M19079 Primary osteoarthritis, unspecified ankle and foot: Secondary | ICD-10-CM | POA: Diagnosis present

## 2012-04-13 DIAGNOSIS — N189 Chronic kidney disease, unspecified: Secondary | ICD-10-CM | POA: Diagnosis present

## 2012-04-13 DIAGNOSIS — Z96659 Presence of unspecified artificial knee joint: Secondary | ICD-10-CM

## 2012-04-13 DIAGNOSIS — Z9289 Personal history of other medical treatment: Secondary | ICD-10-CM

## 2012-04-13 DIAGNOSIS — D62 Acute posthemorrhagic anemia: Secondary | ICD-10-CM | POA: Diagnosis not present

## 2012-04-13 DIAGNOSIS — Z87442 Personal history of urinary calculi: Secondary | ICD-10-CM

## 2012-04-13 DIAGNOSIS — E876 Hypokalemia: Secondary | ICD-10-CM | POA: Diagnosis not present

## 2012-04-13 HISTORY — PX: TOTAL KNEE ARTHROPLASTY: SHX125

## 2012-04-13 SURGERY — ARTHROPLASTY, KNEE, TOTAL
Anesthesia: General | Site: Knee | Laterality: Left | Wound class: Clean

## 2012-04-13 MED ORDER — FENTANYL CITRATE 0.05 MG/ML IJ SOLN
INTRAMUSCULAR | Status: DC | PRN
  Administered 2012-04-13 (×2): 50 ug via INTRAVENOUS

## 2012-04-13 MED ORDER — ROCURONIUM BROMIDE 100 MG/10ML IV SOLN
INTRAVENOUS | Status: DC | PRN
  Administered 2012-04-13: 5 mg via INTRAVENOUS

## 2012-04-13 MED ORDER — ACETAMINOPHEN 325 MG PO TABS
650.0000 mg | ORAL_TABLET | Freq: Four times a day (QID) | ORAL | Status: DC | PRN
  Administered 2012-04-14: 650 mg via ORAL
  Filled 2012-04-13: qty 2

## 2012-04-13 MED ORDER — LIDOCAINE HCL (CARDIAC) 20 MG/ML IV SOLN
INTRAVENOUS | Status: DC | PRN
  Administered 2012-04-13: 25 mg via INTRAVENOUS

## 2012-04-13 MED ORDER — FLEET ENEMA 7-19 GM/118ML RE ENEM: 1.0000 | ENEMA | Freq: Once | RECTAL | Status: AC | PRN

## 2012-04-13 MED ORDER — ACETAMINOPHEN 10 MG/ML IV SOLN
1000.0000 mg | Freq: Four times a day (QID) | INTRAVENOUS | Status: AC
  Administered 2012-04-13 – 2012-04-14 (×4): 1000 mg via INTRAVENOUS
  Filled 2012-04-13 (×4): qty 100

## 2012-04-13 MED ORDER — KETOROLAC TROMETHAMINE 15 MG/ML IJ SOLN
7.5000 mg | Freq: Four times a day (QID) | INTRAMUSCULAR | Status: AC
  Administered 2012-04-13 – 2012-04-14 (×2): 7.5 mg via INTRAVENOUS
  Filled 2012-04-13 (×2): qty 1

## 2012-04-13 MED ORDER — BUPIVACAINE ON-Q PAIN PUMP (FOR ORDER SET NO CHG)
INJECTION | Status: DC
  Filled 2012-04-13: qty 1

## 2012-04-13 MED ORDER — BUPIVACAINE 0.25 % ON-Q PUMP SINGLE CATH 100 ML
INJECTION | Status: DC | PRN
  Administered 2012-04-13: 100 mL

## 2012-04-13 MED ORDER — OXYCODONE HCL 5 MG PO TABS
5.0000 mg | ORAL_TABLET | ORAL | Status: DC | PRN
  Administered 2012-04-13: 5 mg via ORAL
  Administered 2012-04-14 (×5): 10 mg via ORAL
  Filled 2012-04-13: qty 1
  Filled 2012-04-13 (×5): qty 2

## 2012-04-13 MED ORDER — MORPHINE SULFATE 2 MG/ML IJ SOLN
1.0000 mg | INTRAMUSCULAR | Status: DC | PRN
  Administered 2012-04-13 (×2): 1 mg via INTRAVENOUS
  Administered 2012-04-13 – 2012-04-15 (×2): 2 mg via INTRAVENOUS
  Filled 2012-04-13 (×4): qty 1

## 2012-04-13 MED ORDER — METHOCARBAMOL 500 MG PO TABS
500.0000 mg | ORAL_TABLET | Freq: Four times a day (QID) | ORAL | Status: DC | PRN
  Administered 2012-04-14 – 2012-04-16 (×5): 500 mg via ORAL
  Filled 2012-04-13 (×5): qty 1

## 2012-04-13 MED ORDER — POLYETHYLENE GLYCOL 3350 17 G PO PACK: 17.0000 g | PACK | Freq: Every day | ORAL | Status: DC | PRN

## 2012-04-13 MED ORDER — SUFENTANIL CITRATE 50 MCG/ML IV SOLN
INTRAVENOUS | Status: DC | PRN
  Administered 2012-04-13: 10 ug via INTRAVENOUS
  Administered 2012-04-13 (×2): 5 ug via INTRAVENOUS
  Administered 2012-04-13 (×3): 10 ug via INTRAVENOUS
  Administered 2012-04-13 (×2): 5 ug via INTRAVENOUS

## 2012-04-13 MED ORDER — LIDOCAINE HCL 4 % MT SOLN
OROMUCOSAL | Status: DC | PRN
  Administered 2012-04-13: 4 mL via TOPICAL

## 2012-04-13 MED ORDER — RIVAROXABAN 10 MG PO TABS
10.0000 mg | ORAL_TABLET | Freq: Every day | ORAL | Status: DC
  Administered 2012-04-14 – 2012-04-16 (×3): 10 mg via ORAL
  Filled 2012-04-13 (×5): qty 1

## 2012-04-13 MED ORDER — BISACODYL 10 MG RE SUPP: 10.0000 mg | Freq: Every day | RECTAL | Status: DC | PRN

## 2012-04-13 MED ORDER — PHENOL 1.4 % MT LIQD: 1.0000 | OROMUCOSAL | Status: DC | PRN

## 2012-04-13 MED ORDER — DEXTROSE-NACL 5-0.9 % IV SOLN
INTRAVENOUS | Status: DC
  Administered 2012-04-14: 01:00:00 via INTRAVENOUS

## 2012-04-13 MED ORDER — ONDANSETRON HCL 4 MG PO TABS
4.0000 mg | ORAL_TABLET | Freq: Four times a day (QID) | ORAL | Status: DC | PRN
  Administered 2012-04-15: 4 mg via ORAL
  Filled 2012-04-13: qty 1

## 2012-04-13 MED ORDER — ACETAMINOPHEN 650 MG RE SUPP: 650.0000 mg | Freq: Four times a day (QID) | RECTAL | Status: DC | PRN

## 2012-04-13 MED ORDER — METHOCARBAMOL 100 MG/ML IJ SOLN
500.0000 mg | Freq: Four times a day (QID) | INTRAVENOUS | Status: DC | PRN
  Administered 2012-04-13 – 2012-04-14 (×2): 500 mg via INTRAVENOUS
  Filled 2012-04-13 (×2): qty 5

## 2012-04-13 MED ORDER — SODIUM CHLORIDE 0.9 % IV SOLN: INTRAVENOUS | Status: DC

## 2012-04-13 MED ORDER — DIPHENHYDRAMINE HCL 12.5 MG/5ML PO ELIX
12.5000 mg | ORAL_SOLUTION | ORAL | Status: DC | PRN
  Administered 2012-04-15: 25 mg via ORAL
  Filled 2012-04-13: qty 10

## 2012-04-13 MED ORDER — MENTHOL 3 MG MT LOZG: 1.0000 | LOZENGE | OROMUCOSAL | Status: DC | PRN

## 2012-04-13 MED ORDER — METOCLOPRAMIDE HCL 10 MG PO TABS: 5.0000 mg | ORAL_TABLET | Freq: Three times a day (TID) | ORAL | Status: DC | PRN

## 2012-04-13 MED ORDER — SODIUM CHLORIDE 0.9 % IR SOLN
Status: DC | PRN
  Administered 2012-04-13: 1000 mL

## 2012-04-13 MED ORDER — CEFAZOLIN SODIUM-DEXTROSE 2-3 GM-% IV SOLR
2.0000 g | Freq: Four times a day (QID) | INTRAVENOUS | Status: AC
  Administered 2012-04-13 – 2012-04-14 (×2): 2 g via INTRAVENOUS
  Filled 2012-04-13 (×2): qty 50

## 2012-04-13 MED ORDER — HYDRALAZINE HCL 20 MG/ML IJ SOLN
INTRAMUSCULAR | Status: DC | PRN
  Administered 2012-04-13 (×2): 5 mg via INTRAVENOUS

## 2012-04-13 MED ORDER — ACETAMINOPHEN 10 MG/ML IV SOLN: 1000.0000 mg | Freq: Once | INTRAVENOUS | Status: DC

## 2012-04-13 MED ORDER — ONDANSETRON HCL 4 MG/2ML IJ SOLN
INTRAMUSCULAR | Status: DC | PRN
  Administered 2012-04-13: 4 mg via INTRAVENOUS

## 2012-04-13 MED ORDER — MIDAZOLAM HCL 5 MG/5ML IJ SOLN
INTRAMUSCULAR | Status: DC | PRN
  Administered 2012-04-13: 2 mg via INTRAVENOUS
  Administered 2012-04-13: 1 mg via INTRAVENOUS

## 2012-04-13 MED ORDER — CEFAZOLIN SODIUM 10 G IJ SOLR
3.0000 g | INTRAMUSCULAR | Status: AC
  Administered 2012-04-13: 2 g via INTRAVENOUS
  Filled 2012-04-13: qty 3000

## 2012-04-13 MED ORDER — TRAMADOL HCL 50 MG PO TABS: 50.0000 mg | ORAL_TABLET | Freq: Four times a day (QID) | ORAL | Status: DC | PRN

## 2012-04-13 MED ORDER — LABETALOL HCL 5 MG/ML IV SOLN
INTRAVENOUS | Status: DC | PRN
  Administered 2012-04-13: 2.5 mg via INTRAVENOUS
  Administered 2012-04-13 (×4): 5 mg via INTRAVENOUS

## 2012-04-13 MED ORDER — ACETAMINOPHEN 10 MG/ML IV SOLN
INTRAVENOUS | Status: DC | PRN
  Administered 2012-04-13: 1000 mg via INTRAVENOUS

## 2012-04-13 MED ORDER — PROPOFOL 10 MG/ML IV BOLUS
INTRAVENOUS | Status: DC | PRN
  Administered 2012-04-13: 150 mg via INTRAVENOUS
  Administered 2012-04-13: 30 mg via INTRAVENOUS
  Administered 2012-04-13: 50 mg via INTRAVENOUS

## 2012-04-13 MED ORDER — HYDROMORPHONE HCL PF 1 MG/ML IJ SOLN
0.2500 mg | INTRAMUSCULAR | Status: DC | PRN
  Administered 2012-04-13 (×2): 0.5 mg via INTRAVENOUS

## 2012-04-13 MED ORDER — METOCLOPRAMIDE HCL 5 MG/ML IJ SOLN: 5.0000 mg | Freq: Three times a day (TID) | INTRAMUSCULAR | Status: DC | PRN

## 2012-04-13 MED ORDER — SUCCINYLCHOLINE CHLORIDE 20 MG/ML IJ SOLN
INTRAMUSCULAR | Status: DC | PRN
  Administered 2012-04-13: 100 mg via INTRAVENOUS

## 2012-04-13 MED ORDER — PROMETHAZINE HCL 25 MG/ML IJ SOLN: 6.2500 mg | INTRAMUSCULAR | Status: DC | PRN

## 2012-04-13 MED ORDER — DOCUSATE SODIUM 100 MG PO CAPS
100.0000 mg | ORAL_CAPSULE | Freq: Two times a day (BID) | ORAL | Status: DC
  Administered 2012-04-13 – 2012-04-16 (×6): 100 mg via ORAL

## 2012-04-13 MED ORDER — LACTATED RINGERS IV SOLN
INTRAVENOUS | Status: DC | PRN
  Administered 2012-04-13 (×3): via INTRAVENOUS

## 2012-04-13 MED ORDER — ONDANSETRON HCL 4 MG/2ML IJ SOLN: 4.0000 mg | Freq: Four times a day (QID) | INTRAMUSCULAR | Status: DC | PRN

## 2012-04-13 MED ORDER — HYDROMORPHONE HCL PF 1 MG/ML IJ SOLN
INTRAMUSCULAR | Status: DC | PRN
  Administered 2012-04-13 (×2): 1 mg via INTRAVENOUS

## 2012-04-13 MED ORDER — DEXAMETHASONE SODIUM PHOSPHATE 10 MG/ML IJ SOLN
INTRAMUSCULAR | Status: DC | PRN
  Administered 2012-04-13: 10 mg via INTRAVENOUS

## 2012-04-13 SURGICAL SUPPLY — 54 items
ANCHOR SUPER QUICK (Anchor) ×2 IMPLANT
BAG ZIPLOCK 12X15 (MISCELLANEOUS) ×2 IMPLANT
BANDAGE ELASTIC 6 VELCRO ST LF (GAUZE/BANDAGES/DRESSINGS) ×2 IMPLANT
BANDAGE ESMARK 6X9 LF (GAUZE/BANDAGES/DRESSINGS) ×1 IMPLANT
BLADE SAG 18X100X1.27 (BLADE) ×2 IMPLANT
BLADE SAW SGTL 11.0X1.19X90.0M (BLADE) ×2 IMPLANT
BNDG ESMARK 6X9 LF (GAUZE/BANDAGES/DRESSINGS) ×2
BOWL SMART MIX CTS (DISPOSABLE) ×2 IMPLANT
CATH KIT ON-Q SILVERSOAK 5IN (CATHETERS) ×2 IMPLANT
CEMENT HV SMART SET (Cement) ×4 IMPLANT
CLOTH BEACON ORANGE TIMEOUT ST (SAFETY) ×2 IMPLANT
CUFF TOURN SGL QUICK 34 (TOURNIQUET CUFF) ×1
CUFF TRNQT CYL 34X4X40X1 (TOURNIQUET CUFF) ×1 IMPLANT
DRAPE EXTREMITY T 121X128X90 (DRAPE) ×2 IMPLANT
DRAPE POUCH INSTRU U-SHP 10X18 (DRAPES) ×2 IMPLANT
DRAPE U-SHAPE 47X51 STRL (DRAPES) ×2 IMPLANT
DRSG ADAPTIC 3X8 NADH LF (GAUZE/BANDAGES/DRESSINGS) ×2 IMPLANT
DRSG EMULSION OIL 3X16 NADH (GAUZE/BANDAGES/DRESSINGS) ×2 IMPLANT
DURAPREP 26ML APPLICATOR (WOUND CARE) ×2 IMPLANT
ELECT REM PT RETURN 9FT ADLT (ELECTROSURGICAL) ×2
ELECTRODE REM PT RTRN 9FT ADLT (ELECTROSURGICAL) ×1 IMPLANT
EVACUATOR 1/8 PVC DRAIN (DRAIN) ×2 IMPLANT
FACESHIELD LNG OPTICON STERILE (SAFETY) ×10 IMPLANT
GLOVE BIO SURGEON STRL SZ8 (GLOVE) ×2 IMPLANT
GLOVE BIOGEL PI IND STRL 8 (GLOVE) ×2 IMPLANT
GLOVE BIOGEL PI INDICATOR 8 (GLOVE) ×2
GLOVE ECLIPSE 8.0 STRL XLNG CF (GLOVE) ×2 IMPLANT
GLOVE SURG SS PI 6.5 STRL IVOR (GLOVE) ×4 IMPLANT
GOWN STRL NON-REIN LRG LVL3 (GOWN DISPOSABLE) ×4 IMPLANT
GOWN STRL REIN XL XLG (GOWN DISPOSABLE) ×2 IMPLANT
HANDPIECE INTERPULSE COAX TIP (DISPOSABLE) ×1
IMMOBILIZER KNEE 20 (SOFTGOODS) ×2
IMMOBILIZER KNEE 20 THIGH 36 (SOFTGOODS) ×1 IMPLANT
KIT BASIN OR (CUSTOM PROCEDURE TRAY) ×2 IMPLANT
MANIFOLD NEPTUNE II (INSTRUMENTS) ×2 IMPLANT
NS IRRIG 1000ML POUR BTL (IV SOLUTION) ×2 IMPLANT
PACK TOTAL JOINT (CUSTOM PROCEDURE TRAY) ×2 IMPLANT
PAD ABD 7.5X8 STRL (GAUZE/BANDAGES/DRESSINGS) ×2 IMPLANT
PADDING CAST COTTON 6X4 STRL (CAST SUPPLIES) ×2 IMPLANT
POSITIONER SURGICAL ARM (MISCELLANEOUS) ×2 IMPLANT
SET HNDPC FAN SPRY TIP SCT (DISPOSABLE) ×1 IMPLANT
SPONGE GAUZE 4X4 12PLY (GAUZE/BANDAGES/DRESSINGS) ×2 IMPLANT
STRIP CLOSURE SKIN 1/2X4 (GAUZE/BANDAGES/DRESSINGS) ×4 IMPLANT
SUCTION FRAZIER 12FR DISP (SUCTIONS) ×2 IMPLANT
SUT MNCRL AB 4-0 PS2 18 (SUTURE) ×2 IMPLANT
SUT V-LOC BARB 180 2/0GR9 GS23 (SUTURE) ×2
SUT VIC AB 2-0 CT1 27 (SUTURE) ×3
SUT VIC AB 2-0 CT1 TAPERPNT 27 (SUTURE) ×3 IMPLANT
SUT VLOC 180 0 24IN GS25 (SUTURE) ×2 IMPLANT
SUTURE V-LC BRB 180 2/0GR9GS23 (SUTURE) ×1 IMPLANT
TOWEL OR 17X26 10 PK STRL BLUE (TOWEL DISPOSABLE) ×4 IMPLANT
TRAY FOLEY CATH 14FRSI W/METER (CATHETERS) ×2 IMPLANT
WATER STERILE IRR 1500ML POUR (IV SOLUTION) ×2 IMPLANT
WRAP KNEE MAXI GEL POST OP (GAUZE/BANDAGES/DRESSINGS) ×4 IMPLANT

## 2012-04-13 NOTE — H&P (View-Only) (Signed)
[name]  DOB: 21-Jun-1946 Married / Language: English / Race: White Female  Date of Admission: 04/13/2012  Chief Complaint:  Left Knee Pain  History of Present Illness The patient is a 66 year old female who comes in for a preoperative History and Physical. The patient is scheduled for a left total knee arthroplasty to be performed by Dr. Gus Rankin. Aluisio, MD at [facility] on 04/13/2012. The patient is a 66 year old female who presents with knee complaints. The patient is seen today in referral from [facility]. The patient reports left knee and right knee symptoms including: pain (posterior pain that radiates into her ankles) which began 5 year(s) ago without any known injury (Patient states that she has pain in both knees. ). Symptoms are exacerbated by walking. She states the knee is getting progressively worse over time. She also has significant lower back problems. She has spinal stenosis and Dr. Jeral Fruit said she may end up needing surgery for that. The main complaint is severe pain in the left worse than right knee. She cannot walk more than a block or so without it getting worse. She is not getting the classic stenosis symptoms but is getting more symptoms consistent with pain from her knees. The right knee hurts also but not as bad as the left. She does get swelling. The knee gives out on her at times. This is definitely limiting what she can and cannot do. She wants to be more active and get back in shape and cannot do so because of the knees. She recently also had a cervical decompression and fusion from Dr. Jeral Fruit. She said that she is doing well with that. They have been treated conservatively in the past for the above stated problem and despite conservative measures, they continue to have progressive pain and severe functional limitations and dysfunction. They have failed non-operative management. It is felt that they would benefit from undergoing total  joint replacement. Risks and benefits of the procedure have been discussed with the patient and they elect to proceed with surgery. There are no active contraindications to surgery such as ongoing infection or rapidly progressive neurological disease.   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 11/16/1996 Enthesopathy, hip (726.5). 10/22/1997 Epicondylitis, lateral (726.32). 10/17/1998 Disorder, shoulder region NEC (726.2). 09/10/2000 Spur, calcaneal (726.73). 07/21/2002 Sprain/strain, rotator cuff (840.4). 11/02/2002 Sprain/strain, ankle NOS (845.00). 08/10/2005 Osteoarthrosis NOS, lower leg (715.96). 08/18/2005 Tear, lateral meniscus, knee, current (836.1). 08/18/2005 Derangement, medial meniscus other & unspc (717.3). 06/27/2009 Lumbago (724.2). 03/10/2010 Osteoarthrosis NOS, other spec site (715.98). 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 03/10/2010   Allergies Ultram *ANALGESICS - OPIOID*   Family History Cancer. mother and father Diabetes Mellitus. mother Heart Disease. mother Hypertension. mother Osteoarthritis. mother and grandmother mothers side   Social History Tobacco use. Never smoker. never smoker No alcohol use Alcohol use. never consumed alcohol Children. 2 Current work status. retired Financial planner (Currently). no Exercise. Exercises never Illicit drug use. no Living situation. live with spouse Marital status. married Number of flights of stairs before winded. less than 1 Pain Contract. no Previously in rehab. no Tobacco / smoke exposure. no Post-Surgical Plans. Plan is to go home. Advance Directives. Living Will   Pregnancy / Birth History Pregnant. no   Past Surgical History Arthroscopy of Knee.  bilateral Gallbladder Surgery. open Hysterectomy. partial (non-cancerous) Neck Disc Surgery Rotator Cuff Repair. bilateral Spinal Decompression.  neck and lower back Spinal Surgery Straighten Nasal Septum   Medical History Chronic Pain Kidney Stone Osteoarthritis Tinnitus Measles Obesity   Review of Systems General:Present- Fatigue. Not Present- Chills, Fever, Night Sweats, Appetite Loss, Feeling sick, Weight Gain and Weight Loss. Skin:Not Present- Itching, Rash, Skin Color Changes, Ulcer, Psoriasis and Change in Hair or Nails. HEENT:Not Present- Sensitivity to light, Nose Bleed, Visual Loss, Decreased Hearing and Ringing in the Ears. Neck:Not Present- Swollen Glands and Neck Mass. Respiratory:Not Present- Shortness of breath, Snoring, Chronic Cough and Bloody sputum. Cardiovascular:Not Present- Shortness of Breath, Chest Pain, Swelling of Extremities, Leg Cramps and Palpitations. Gastrointestinal:Not Present- Bloody Stool, Heartburn, Abdominal Pain, Vomiting, Nausea and Incontinence of Stool. Female Genitourinary:Not Present- Blood in Urine, Irregular/missing periods, Frequency, Incontinence and Nocturia. Musculoskeletal:Present- Muscle Weakness, Muscle Pain, Joint Stiffness, Joint Pain and Back Pain. Not Present- Joint Swelling. Neurological:Not Present- Tingling, Numbness, Burning, Tremor, Headaches and Dizziness. Psychiatric:Not Present- Anxiety, Depression and Memory Loss. Endocrine:Not Present- Cold Intolerance, Heat Intolerance, Excessive hunger and Excessive Thirst. Hematology:Not Present- Abnormal Bleeding, Abnormal bruising, Anemia and Blood Clots.   Vitals Weight: 225 lb Height: 61 in Body Surface Area: 2.1 m Body Mass Index: 42.51 kg/m Pulse: 64 (Regular) Resp.: 14 (Unlabored) BP: 146/88 (Sitting, Right Arm, Standard)   Physical Exam The physical exam findings are as follows:   General Mental Status - Alert, cooperative  and good historian. General Appearance- pleasant. Not in acute distress. Orientation- Oriented X3. Build & Nutrition- Well nourished and Well developed.   Head and Neck Head- normocephalic, atraumatic . Neck Global Assessment- supple. no bruit auscultated on the right and no bruit auscultated on the left.   Eye Pupil- Bilateral- Regular and Round. Motion- Bilateral- EOMI.   Chest and Lung Exam Auscultation: Breath sounds:- clear at anterior chest wall and - clear at posterior chest wall. Adventitious sounds:- No Adventitious sounds.   Cardiovascular Auscultation:Rhythm- Regular rate and rhythm. Heart Sounds- S1 WNL and S2 WNL. Murmurs & Other Heart Sounds:Auscultation of the heart reveals - No Murmurs.   Abdomen Inspection:Contour- Generalized moderate distention. Palpation/Percussion:Tenderness- Abdomen is non-tender to palpation. Rigidity (guarding)- Abdomen is soft. Auscultation:Auscultation of the abdomen reveals - Bowel sounds normal.   Female Genitourinary Not done, not pertinent to present illness  Musculoskeletal On exam, she is a well developed female alert and oriented in no apparent distress. Both hips show normal range of motion with no discomfort. The left knee shows a slight valgus deformity. Range is 5-125. There is marked crepitus on range of motion. She is tender lateral greater than medial. There is no instability. Right knee no effusion. Range 5-130. Slight crepitus on range of motion. Tender medial greater than lateral. Slight varus. No instability. Pulse, sensation, and motor are intact both lower extremities.  RADIOGRAPHS: AP both knees and lateral show advanced end stage arthritis of both knees, left worse than right. Slight valgus on the left with bone on bone lateral and patellofemoral and tibial subluxation. Right knee shows bone on bone medial and patellofemoral but the arthritis overall is worse on the left  than right.  Assessment & Plan Osteoarthrosis NOS, lower leg (715.96) Impression: Left Knee  Patient is for a left total knee replacement by Dr. Lequita Halt.  Plan is to go home.  Patinet states during her H&P that she has had some anxiety prior to her surgery due to complications that occurred after her neck surgery postoperatively.  PCP - Dr. Jeannetta Nap - Patient has been seen preoperatively and felt to be stable for surgery.  Signed electronically by Roberts Gaudy, PA-C

## 2012-04-13 NOTE — Progress Notes (Signed)
Pt with elevated bp of 181 82  Dr rose/anesthiologist notified  No new orders

## 2012-04-13 NOTE — Op Note (Signed)
Pre-operative diagnosis- Osteoarthritis  Left knee(s)  Post-operative diagnosis- Osteoarthritis Left knee(s)  Procedure-  Left  Total Knee Arthroplasty  Surgeon- Gus Rankin. Shakeisha Horine, MD  Assistant- Leilani Able, PA-C   Anesthesia-  General EBL-* No blood loss amount entered *  Drains Hemovac  Tourniquet time-  Total Tourniquet Time Documented: Thigh (Left) - 4 minutes   Complications- None  Condition-PACU - hemodynamically stable.   Brief Clinical Note  [name] is a 66 y.o. year old female with end stage OA of her left knee with progressively worsening pain and dysfunction. She has constant pain, with activity and at rest and significant functional deficits with difficulties even with ADLs. She has had extensive non-op management including analgesics, injections of cortisone  and home exercise program, but remains in significant pain with significant dysfunction. Radiographs show bone on bone arthritis lateral and patellofemoral. She presents now for left Total Knee Arthroplasty.    Procedure in detail---   The patient is brought into the operating room and positioned supine on the operating table. After successful administration of  General,   a tourniquet is placed high on the  Left thigh(s) and the lower extremity is prepped and draped in the usual sterile fashion. Time out is performed by the operating team and then the  Left lower extremity is wrapped in Esmarch, knee flexed and the tourniquet inflated to 300 mmHg.       A midline incision is made with a ten blade through the subcutaneous tissue to the level of the extensor mechanism. A fresh blade is used to make a medial parapatellar arthrotomy. Soft tissue over the proximal medial tibia is subperiosteally elevated to the joint line with a knife and into the semimembranosus bursa with a Cobb elevator. It is apparent that the tourniquet is not functioning and thus it is deflated and the remainder of the procedure was performed  without tourniquet. Soft tissue over the proximal lateral tibia is elevated with attention being paid to avoiding the patellar tendon on the tibial tubercle. The patella is everted, knee flexed 90 degrees and the ACL and PCL are removed. Findings are bone on bone all 3 compartments with large global osteophytes.        The drill is used to create a starting hole in the distal femur and the canal is thoroughly irrigated with sterile saline to remove the fatty contents. The 5 degree Left  valgus alignment guide is placed into the femoral canal and the distal femoral cutting block is pinned to remove 11 mm off the distal femur. Resection is made with an oscillating saw.      The tibia is subluxed forward and the menisci are removed. The extramedullary alignment guide is placed referencing proximally at the medial aspect of the tibial tubercle and distally along the second metatarsal axis and tibial crest. The block is pinned to remove 2mm off the more deficient lateral  side. Resection is made with an oscillating saw. Size 3is the most appropriate size for the tibia and the proximal tibia is prepared with the modular drill and keel punch for that size.      The femoral sizing guide is placed and size 4 narrow is most appropriate. Rotation is marked off the epicondylar axis and confirmed by creating a rectangular flexion gap at 90 degrees. The size 4 cutting block is pinned in this rotation and the anterior, posterior and chamfer cuts are made with the oscillating saw. The intercondylar block is then placed and  that cut is made.      Trial size 3 tibial component, trial size 4 narrow posterior stabilized femur and a 10  mm posterior stabilized rotating platform insert trial is placed. Full extension is achieved with excellent varus/valgus and anterior/posterior balance throughout full range of motion. The patella is everted and thickness measured to be 22  mm. Free hand resection is taken to 12 mm, a 35 template is  placed, lug holes are drilled, trial patella is placed, and it tracks normally. Osteophytes are removed off the posterior femur with the trial in place. All trials are removed and the cut bone surfaces prepared with pulsatile lavage. Cement is mixed and once ready for implantation, the size 3 tibial implant, size  4 narrow posterior stabilized femoral component, and the size 35 patella are cemented in place and the patella is held with the clamp. The trial insert is placed and the knee held in full extension. All extruded cement is removed and once the cement is hard the permanent 10 mm posterior stabilized rotating platform insert is placed into the tibial tray.      The wound is copiously irrigated with saline solution and the extensor mechanism closed over a hemovac drain with #1 V-loc suture.. Flexion against gravity is 140 degrees and the patella tracks normally. Subcutaneous tissue is closed with 2.0 vicryl and subcuticular with running 4.0 Monocryl. The catheter for the Marcaine pain pump is placed and the pump is initiated. The incision is cleaned and dried and steri-strips and a bulky sterile dressing are applied. The limb is placed into a knee immobilizer and the patient is awakened and transported to recovery in stable condition.      Please note that a surgical assistant was a medical necessity for this procedure in order to perform it in a safe and expeditious manner. Surgical assistant was necessary to retract the ligaments and vital neurovascular structures to prevent injury to them and also necessary for proper positioning of the limb to allow for anatomic placement of the prosthesis.   Gus Rankin Elham Fini, MD    04/13/2012, 2:12 PM

## 2012-04-13 NOTE — Anesthesia Preprocedure Evaluation (Addendum)
Anesthesia Evaluation  Patient identified by MRN, date of birth, ID band Patient awake    Reviewed: Allergy & Precautions, H&P , NPO status , Patient's Chart, lab work & pertinent test results  Airway Mallampati: II TM Distance: >3 FB Neck ROM: Full    Dental No notable dental hx.    Pulmonary neg pulmonary ROS,  breath sounds clear to auscultation  Pulmonary exam normal       Cardiovascular negative cardio ROS  Rhythm:Regular Rate:Normal     Neuro/Psych negative neurological ROS  negative psych ROS   GI/Hepatic negative GI ROS, Neg liver ROS,   Endo/Other  Morbid obesity  Renal/GU Renal InsufficiencyRenal disease  negative genitourinary   Musculoskeletal negative musculoskeletal ROS (+)   Abdominal (+) + obese,   Peds negative pediatric ROS (+)  Hematology negative hematology ROS (+)   Anesthesia Other Findings   Reproductive/Obstetrics negative OB ROS                           Anesthesia Physical Anesthesia Plan  ASA: III  Anesthesia Plan: General   Post-op Pain Management:    Induction: Intravenous  Airway Management Planned: Oral ETT  Additional Equipment:   Intra-op Plan:   Post-operative Plan: Extubation in OR  Informed Consent: I have reviewed the patients History and Physical, chart, labs and discussed the procedure including the risks, benefits and alternatives for the proposed anesthesia with the patient or authorized representative who has indicated his/her understanding and acceptance.   Dental advisory given  Plan Discussed with: CRNA  Anesthesia Plan Comments: (Discussed r/b/a general versus spinal. Patient prefers general.)       Anesthesia Quick Evaluation

## 2012-04-13 NOTE — Anesthesia Postprocedure Evaluation (Signed)
  Anesthesia Post-op Note  Patient: [name]  Procedure(s) Performed: Procedure(s) (LRB): TOTAL KNEE ARTHROPLASTY (Left)  Patient Location: PACU  Anesthesia Type: General  Level of Consciousness: awake and alert   Airway and Oxygen Therapy: Patient Spontanous Breathing  Post-op Pain: mild  Post-op Assessment: Post-op Vital signs reviewed, Patient's Cardiovascular Status Stable, Respiratory Function Stable, Patent Airway and No signs of Nausea or vomiting  Post-op Vital Signs: stable  Complications: No apparent anesthesia complications

## 2012-04-13 NOTE — Interval H&P Note (Signed)
History and Physical Interval Note:  04/13/2012 12:07 PM  [name]  has presented today for surgery, with the diagnosis of osteoarthritis left knee  The various methods of treatment have been discussed with the patient and family. After consideration of risks, benefits and other options for treatment, the patient has consented to  Procedure(s) (LRB) with comments: TOTAL KNEE ARTHROPLASTY (Left) as a surgical intervention .  The patient's history has been reviewed, patient examined, no change in status, stable for surgery.  I have reviewed the patient's chart and labs.  Questions were answered to the patient's satisfaction.     Loanne Drilling

## 2012-04-14 ENCOUNTER — Encounter (HOSPITAL_COMMUNITY): Payer: Self-pay | Admitting: Orthopedic Surgery

## 2012-04-14 DIAGNOSIS — D62 Acute posthemorrhagic anemia: Secondary | ICD-10-CM | POA: Diagnosis not present

## 2012-04-14 DIAGNOSIS — E876 Hypokalemia: Secondary | ICD-10-CM | POA: Diagnosis not present

## 2012-04-14 LAB — CBC
Hemoglobin: 9 g/dL — ABNORMAL LOW (ref 12.0–15.0)
MCHC: 33.7 g/dL (ref 30.0–36.0)
Platelets: 277 10*3/uL (ref 150–400)

## 2012-04-14 LAB — BASIC METABOLIC PANEL
GFR calc Af Amer: >90 mL/min (ref >90)
GFR calc non Af Amer: 86 mL/min — ABNORMAL LOW (ref >90)
Glucose, Bld: 221 mg/dL — ABNORMAL HIGH (ref 70–99)
Potassium: 3.3 mEq/L — ABNORMAL LOW (ref 3.5–5.1)
Sodium: 137 mEq/L (ref 135–145)

## 2012-04-14 MED ORDER — HYDROCODONE-ACETAMINOPHEN 5-325 MG PO TABS
2.0000 | ORAL_TABLET | ORAL | Status: DC | PRN
  Administered 2012-04-14 – 2012-04-15 (×4): 2 via ORAL
  Filled 2012-04-14 (×4): qty 2

## 2012-04-14 MED ORDER — POLYSACCHARIDE IRON COMPLEX 150 MG PO CAPS
150.0000 mg | ORAL_CAPSULE | Freq: Every day | ORAL | Status: DC
  Administered 2012-04-14: 150 mg via ORAL
  Filled 2012-04-14 (×2): qty 1

## 2012-04-14 NOTE — Transfer of Care (Deleted)
Immediate Anesthesia Transfer of Care Note  Patient: [name]  Procedure(s) Performed: Procedure(s) (LRB) with comments: TOTAL KNEE ARTHROPLASTY (Left)  Patient Location: PACU  Anesthesia Type: Spinal  Level of Consciousness: awake, alert , oriented and patient cooperative  Airway & Oxygen Therapy: Patient Spontanous Breathing and Patient connected to face mask oxygen  Post-op Assessment: Report given to PACU RN and Post -op Vital signs reviewed and stable  Post vital signs: stable  Complications: No apparent anesthesia complications

## 2012-04-14 NOTE — Clinical Documentation Improvement (Signed)
BMI DOCUMENTATION CLARIFICATION QUERY  THIS DOCUMENT IS NOT A PERMANENT PART OF THE MEDICAL RECORD  TO RESPOND TO THE THIS QUERY, FOLLOW THE INSTRUCTIONS BELOW:  1. If needed, update documentation for the patient's encounter via the notes activity.  2. Access this query again and click edit on the In [employer].  3. After updating, or not, click F2 to complete all highlighted (required) fields concerning your review. Select "additional documentation in the medical record" OR "no additional documentation provided".  4. Click Sign note button.  5. The deficiency will fall out of your In Basket *Please let us know if you are not able to complete this workflow by phone or e-mail (listed below).         04/14/12  Dear Dr. Lequita Halt or [name], F/Associates  In an effort to better capture your patient's severity of illness, reflect appropriate length of stay and utilization of resources, a review of the patient medical record has revealed the following indicators.    Based on your clinical judgment, please clarify and document in a progress note and/or discharge summary the clinical condition associated with the following supporting information:  In responding to this query please exercise your independent judgment.  The fact that a query is asked, does not imply that any particular answer is desired or expected.  Based on your clinical judgment can you provide a diagnosis that represents the below listed clinical indicators?  In this pt admitted for L TKA a review of the medical records reveals the following:   BMI= 43.2  HT=5'1"  WT=229 lbs  H/O obesity  Please document the diagnosis responsible for the above clinical indicators (if known) in the medical records.    Possible Clinical conditions  Morbid Obesity W/ BMI=   Underweight w/BMI=  Other condition___________________  Cannot Clinically determine _____________  Risk Factors: Sign & Symptoms:  BMI=  43.2  HT=5'1"  WT=229 lbs  H/O obesity    Treatment Regular diet monitoring     Reviewed:  no additional documentation provided ljh  Thank You,  Enis Slipper RN, BSN, MSN/Inf, CCDS Clinical Documentation Specialist [facility] HIM Dept Pager: [phone] / E-mail: [email or web address]  Health Information Management [place]

## 2012-04-14 NOTE — Progress Notes (Signed)
CARE MANAGEMENT NOTE 04/14/2012  Patient:  FALICITY, SHEETS   Account Number:  [id number]  Date Initiated:  04/14/2012  Documentation initiated by:  Colleen Can  Subjective/Objective Assessment:   dx osteoarthritis left knee; total knee replacemnt     Action/Plan:   CM spoke with patient and sister. Plans are for patient to return to her home in [place] where spouse and sister will be caregivers. Already has DME. Wants [facility] for [facility] services   Anticipated DC Date:  04/16/2012   Anticipated DC Plan:  HOME W HOME HEALTH SERVICES  In-house referral  Clinical Social Worker      DC Planning Services  CM consult      [facility] Choice  HOME HEALTH   Choice offered to / List presented to:  C-1 Patient   DME arranged  NA      DME agency  NA     HH arranged  HH-2 PT      [facility] agency  [facility]   Status of service:  In process, will continue to follow  Comments:  04/14/2012 Raynelle Bring BSN CCM [phone] Spoke  with Maralyn Sago at [employer]; they can service patient with start date of 48hrs within d/c date. Faxed orders, face sheet, op note and H& p to [phone]. CM will follow

## 2012-04-14 NOTE — Progress Notes (Signed)
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)  Precautions  Precautions Knee  Required Braces or Orthoses Knee Immobilizer - Left  Knee Immobilizer - Left Discontinue once straight leg raise with < 10 degree lag  Restrictions  LLE Weight Bearing WBAT  Cognition  Overall Cognitive Status Appears within functional limits for tasks assessed/performed  Bed Mobility  Bed Mobility Sit to Supine  Sit to Supine HOB elevated;4: Min assist  Details for Bed Mobility Assistance assist for L LE, verbal cues for technique  Transfers  Transfers Stand to Sit;Sit to Stand  Sit to Stand 4: Min guard;With upper extremity assist;From chair/3-in-1  Stand to Sit 4: Min guard;With upper extremity assist;With armrests;To bed  Details for Transfer Assistance verbal cues for safe technique  Ambulation/Gait  Ambulation/Gait Assistance 4: Min guard  Ambulation Distance (Feet) 80 Feet  Assistive device Rolling walker  Ambulation/Gait Assistance Details verbal cues for sequence, step length, RW distance  Gait Pattern Step-to pattern;Decreased stance time - left;Antalgic;Trunk flexed  Gait velocity decreased  Exercises  Exercises Total Joint  Total Joint Exercises  Ankle Circles/Pumps AROM;Both;20 reps  Quad Sets AROM;Both;20 reps  Short Arc [place];Left;15 reps  Heel Slides AAROM;Left;15 reps  Hip ABduction/ADduction AROM;Strengthening;Left;15 reps  Straight Leg Raises AAROM;Strengthening;Left;10 reps  Goniometric ROM L knee AAROM -6-35*, limited by guarding and pain  PT - End of Session  Equipment Utilized During Treatment Left knee immobilizer  Activity Tolerance Patient limited by fatigue  Patient left in bed;with call bell/phone within  reach;with family/visitor present  Nurse Communication Patient requests pain meds  PT - Assessment/Plan  Comments on Treatment Session Pt ambulated in hallway and performed exercises.  Pt doing better with mobility and plans to d/c home with spouse tomorrow.  PT Plan Discharge plan remains appropriate;Frequency remains appropriate  Follow Up Recommendations Home health PT  Equipment Recommended None recommended by PT  Acute Rehab PT Goals  PT Goal: Supine/Side to Sit - Progress Progressing toward goal  PT Goal: Sit to Supine/Side - Progress Progressing toward goal  PT Goal: Sit to Stand - Progress Progressing toward goal  PT Goal: Stand to Sit - Progress Progressing toward goal  PT Goal: Ambulate - Progress Progressing toward goal  PT Goal: Perform Home Exercise Program - Progress Progressing toward goal  PT General Charges  $$ ACUTE PT VISIT 1 Procedure  PT Treatments  $Gait Training 8-22 mins  $Therapeutic Exercise 8-22 mins    Zenovia Jarred, PT Pager: [phone]

## 2012-04-14 NOTE — Progress Notes (Signed)
   Subjective: 1 Day Post-Op Procedure(s) (LRB): TOTAL KNEE ARTHROPLASTY (Left) Patient reports pain as mild and moderate.   Patient seen in rounds with Dr. Lequita Halt.  Patient had a rough night with pain but better this morning. Patient is having problems with pain in the knee, requiring pain medications We will start therapy today.  Plan is to go Home after hospital stay.  Objective: Vital signs in last 24 hours: Temp:  [97 F (36.1 C)-98.7 F (37.1 C)] 97 F (36.1 C) (10/03 0445) Pulse Rate:  [68-80] 68  (10/03 0445) Resp:  [12-20] 18  (10/03 0445) BP: (90-207)/(55-97) 145/80 mmHg (10/03 0445) SpO2:  [96 %-100 %] 100 % (10/03 0445) FiO2 (%):  [96 %] 96 % (10/02 1615) Weight:  [103.874 kg (229 lb)] 103.874 kg (229 lb) (10/02 1615)  Intake/Output from previous day:  Intake/Output Summary (Last 24 hours) at 04/14/12 0806 Last data filed at 04/14/12 0607  Gross per 24 hour  Intake   3895 ml  Output   1460 ml  Net   2435 ml     Labs:  Basename 04/14/12 0350  HGB 9.0*    Basename 04/14/12 0350  WBC 13.0*  RBC 3.03*  HCT 26.7*  PLT 277    Basename 04/14/12 0350  NA 137  K 3.3*  CL 100  CO2 23  BUN 19  CREATININE 0.74  GLUCOSE 221*  CALCIUM 8.2*   No results found for this basename: LABPT:2,INR:2 in the last 72 hours  EXAM General - Patient is Alert, Appropriate and Oriented Extremity - Neurovascular intact Sensation intact distally Dorsiflexion/Plantar flexion intact Dressing - dressing C/D/I Motor Function - intact, moving foot and toes well on exam.  Hemovac pulled without difficulty.  Past Medical History  Diagnosis Date  . Chronic kidney disease     KIDNEY STONE  . Arthritis     both knees    Assessment/Plan: 1 Day Post-Op Procedure(s) (LRB): TOTAL KNEE ARTHROPLASTY (Left) Principal Problem:  *OA (osteoarthritis) of knee Active Problems:  Postop Acute blood loss anemia  Postop Hypokalemia   Advance diet Up with therapy Plan for  discharge tomorrow Discharge home with home health  DVT Prophylaxis - Xarelto Weight-Bearing as tolerated to left leg No vaccines. D/C O2 and Pulse OX and try on Room Air  Anjulie Dipierro 04/14/2012, 8:06 AM

## 2012-04-14 NOTE — Progress Notes (Signed)
Utilization review completed.  

## 2012-04-14 NOTE — Evaluation (Signed)
Physical Therapy Evaluation Patient Details Name: [name] MRN: [id number] DOB: 27-May-1946 Today's Date: 04/14/2012 Time: 4401-0272 PT Time Calculation (min): 36 min  PT Assessment / Plan / Recommendation Clinical Impression  Pt s/p L TKR.  Pt would benefit from acute PT services in order to improve independence with transfers, ambulation and stairs by increasing L LE ROM and strength to prepare for d/c home with spouse.    PT Assessment  Patient needs continued PT services    Follow Up Recommendations  Home health PT    Barriers to Discharge        Equipment Recommendations  None recommended by PT    Recommendations for Other Services     Frequency 7X/week    Precautions / Restrictions Precautions Precautions: Knee Required Braces or Orthoses: Knee Immobilizer - Left Knee Immobilizer - Left: Discontinue once straight leg raise with < 10 degree lag Restrictions LLE Weight Bearing: Weight bearing as tolerated   Pertinent Vitals/Pain 2/10 L knee pain , repositioned, ice applied      Mobility  Bed Mobility Bed Mobility: Supine to Sit Supine to Sit: HOB elevated;4: Min assist Details for Bed Mobility Assistance: assist for L LE, verbal cues for technique Transfers Transfers: Stand to Sit;Sit to Stand Sit to Stand: With upper extremity assist;From bed;4: Min guard Stand to Sit: With upper extremity assist;To chair/3-in-1;4: Min guard Details for Transfer Assistance: verbal cues for safe technique Ambulation/Gait Ambulation/Gait Assistance: 4: Min guard Ambulation Distance (Feet): 40 Feet Assistive device: Rolling walker Ambulation/Gait Assistance Details: limited by UE fatigue, verbal cues for sequence, posture, RW distance Gait Pattern: Step-to pattern;Decreased stance time - left;Antalgic;Trunk flexed Gait velocity: decreased    Shoulder Instructions     Exercises     PT Diagnosis: Difficulty walking  PT Problem List: Decreased strength;Decreased range of  motion;Decreased activity tolerance;Decreased mobility;Decreased knowledge of precautions;Pain;Decreased knowledge of use of DME PT Treatment Interventions: DME instruction;Gait training;Functional mobility training;Stair training;Therapeutic activities;Therapeutic exercise;Patient/family education   PT Goals Acute Rehab PT Goals PT Goal Formulation: With patient Time For Goal Achievement: 04/21/12 Potential to Achieve Goals: Good Pt will go Supine/Side to Sit: with modified independence PT Goal: Supine/Side to Sit - Progress: Goal set today Pt will go Sit to Supine/Side: with modified independence PT Goal: Sit to Supine/Side - Progress: Goal set today Pt will go Sit to Stand: with modified independence PT Goal: Sit to Stand - Progress: Goal set today Pt will go Stand to Sit: with modified independence PT Goal: Stand to Sit - Progress: Goal set today Pt will Ambulate: >150 feet;with modified independence;with least restrictive assistive device PT Goal: Ambulate - Progress: Goal set today Pt will Go Up / Down Stairs: 1-2 stairs;with supervision;with least restrictive assistive device PT Goal: Up/Down Stairs - Progress: Goal set today Pt will Perform Home Exercise Program: with supervision, verbal cues required/provided PT Goal: Perform Home Exercise Program - Progress: Goal set today  Visit Information  Last PT Received On: 04/14/12 Assistance Needed: +1    Subjective Data  Subjective: I need to order my friends lunch real quick.   Prior Functioning  Home Living Lives With: Spouse Type of Home: House Home Access: Stairs to enter Entrance Stairs-Number of Steps: 2 Entrance Stairs-Rails: None Home Layout: One level Home Adaptive Equipment: Straight cane;Walker - rolling;Other (comment) (raised toilet no rails) Prior Function Level of Independence: Independent Communication Communication: No difficulties    Cognition  Overall Cognitive Status: Appears within functional limits  for tasks assessed/performed Arousal/Alertness: Awake/alert Orientation  Level: Appears intact for tasks assessed Behavior During Session: [facility] for tasks performed    Extremity/Trunk Assessment Right Upper Extremity Assessment RUE ROM/Strength/Tone: [facility] for tasks assessed Left Upper Extremity Assessment LUE ROM/Strength/Tone: Deficits LUE ROM/Strength/Tone Deficits: unable to actively move greater than 90* upward motions due to hx of rotator cuff repair Right Lower Extremity Assessment RLE ROM/Strength/Tone: [facility] for tasks assessed Left Lower Extremity Assessment LLE ROM/Strength/Tone: Deficits LLE ROM/Strength/Tone Deficits: good quad contraction, maintained KI, ROM TBA   Balance    End of Session PT - End of Session Equipment Utilized During Treatment: Gait belt;Left knee immobilizer Activity Tolerance: Patient limited by fatigue Patient left: in chair;with call bell/phone within reach;with family/visitor present  GP     [place] Schorr,KATHrine E 04/14/2012, 12:18 PM Pager: [phone]

## 2012-04-14 NOTE — Evaluation (Signed)
Occupational Therapy Evaluation Patient Details Name: [name] MRN: [id number] DOB: September 21, 1945 Today's Date: 04/14/2012 Time: 4098-1191 OT Time Calculation (min): 43 min  OT Assessment / Plan / Recommendation Clinical Impression  Pt doing well POD 1 LTKR. All education completed. Pt will have necessary level of A from family upon d/c. Pt has all DME.    OT Assessment  Patient does not need any further OT services    Follow Up Recommendations  No OT follow up    Barriers to Discharge      Equipment Recommendations  None recommended by OT    Recommendations for Other Services    Frequency       Precautions / Restrictions Precautions Precautions: Knee Required Braces or Orthoses: Knee Immobilizer - Left Knee Immobilizer - Left: Discontinue once straight leg raise with < 10 degree lag Restrictions Weight Bearing Restrictions: No LLE Weight Bearing: Weight bearing as tolerated   Pertinent Vitals/Pain C/o minimal knee pain. Repositioned for comfort.    ADL  Grooming: Simulated;Min guard Where Assessed - Grooming: Supported standing Upper Body Bathing: Simulated;Set up Where Assessed - Upper Body Bathing: Unsupported sitting Lower Body Bathing: Simulated;Minimal assistance Where Assessed - Lower Body Bathing: Supported sit to stand Upper Body Dressing: Simulated;Set up Where Assessed - Upper Body Dressing: Unsupported sitting Lower Body Dressing: Simulated;Minimal assistance Where Assessed - Lower Body Dressing: Supported sit to stand Toilet Transfer: Performed;Min guard Statistician Method: Sit to Barista: Comfort height toilet;Grab bars Toileting - Architect and Hygiene: Performed;Min guard Where Assessed - Engineer, mining and Hygiene: Sit to stand from 3-in-1 or toilet Tub/Shower Transfer: Performed;Min guard Tub/Shower Transfer Method: Science writer: Walk in Scientist, research (physical sciences)  Used: Rolling walker Transfers/Ambulation Related to ADLs: Pt ambulated to the bathroom with minguard A and RW. ADL Comments: Educated pt how to use reacher for LB ADLs with good return demo.    OT Diagnosis:    OT Problem List:   OT Treatment Interventions:     OT Goals    Visit Information  Assistance Needed: +1    Subjective Data      Prior Functioning     Home Living Lives With: Spouse Available Help at Discharge: Family Type of Home: House Home Access: Stairs to enter Secretary/administrator of Steps: 2 Entrance Stairs-Rails: None Home Layout: One level Bathroom Shower/Tub: Health visitor: Handicapped height Home Adaptive Equipment: Environmental consultant - rolling;Straight cane;Built-in shower seat Prior Function Level of Independence: Independent Able to Take Stairs?: Yes Driving: Yes Vocation: Part time employment Communication Communication: No difficulties Dominant Hand: Right         Vision/Perception     Cognition  Overall Cognitive Status: Appears within functional limits for tasks assessed/performed Arousal/Alertness: Awake/alert Orientation Level: Appears intact for tasks assessed Behavior During Session: [facility] for tasks performed    Extremity/Trunk Assessment Right Upper Extremity Assessment RUE ROM/Strength/Tone: [facility] for tasks assessed Left Upper Extremity Assessment LUE ROM/Strength/Tone: Deficits LUE ROM/Strength/Tone Deficits: unable to actively move greater than 90* upward motions due to hx of rotator cuff repair. Educated pt in self ROM exercises she could do at home.     Mobility Bed Mobility Bed Mobility: Supine to Sit Supine to Sit: HOB elevated;4: Min assist Details for Bed Mobility Assistance: assist for L LE, verbal cues for technique Transfers Sit to Stand: 4: Min guard;With upper extremity assist;From chair/3-in-1;From toilet Stand to Sit: 4: Min guard;With upper extremity assist;To chair/3-in-1;With armrests Details for  Transfer Assistance: verbal  cues for safe technique     Shoulder Instructions     Exercise     Balance     End of Session OT - End of Session Equipment Utilized During Treatment: Gait belt Activity Tolerance: Patient tolerated treatment well Patient left: in chair;with call bell/phone within reach;with family/visitor present  GO     Kaspar Albornoz A OTR/L [phone] 04/14/2012, 3:30 PM

## 2012-04-14 NOTE — Transfer of Care (Signed)
Immediate Anesthesia Transfer of Care Note  Patient: [name]  Procedure(s) Performed: Procedure(s) (LRB) with comments: TOTAL KNEE ARTHROPLASTY (Left)  Patient Location: PACU  Anesthesia Type: General  Level of Consciousness: awake, alert , patient cooperative and responds to stimulation  Airway & Oxygen Therapy: Patient Spontanous Breathing and Patient connected to face mask oxygen  Post-op Assessment: Report given to PACU RN, Post -op Vital signs reviewed and stable and Patient moving all extremities X 4  Post vital signs: stable  Complications: No apparent anesthesia complications

## 2012-04-15 DIAGNOSIS — Z9289 Personal history of other medical treatment: Secondary | ICD-10-CM

## 2012-04-15 LAB — BASIC METABOLIC PANEL
CO2: 26 mEq/L (ref 19–32)
Chloride: 103 mEq/L (ref 96–112)
Glucose, Bld: 157 mg/dL — ABNORMAL HIGH (ref 70–99)
Potassium: 3.3 mEq/L — ABNORMAL LOW (ref 3.5–5.1)
Sodium: 138 mEq/L (ref 135–145)

## 2012-04-15 LAB — URINE MICROSCOPIC-ADD ON

## 2012-04-15 LAB — URINALYSIS, ROUTINE W REFLEX MICROSCOPIC
Glucose, UA: NEGATIVE mg/dL
Specific Gravity, Urine: 1.015 (ref 1.005–1.030)
pH: 5.5 (ref 5.0–8.0)

## 2012-04-15 LAB — PREPARE RBC (CROSSMATCH)

## 2012-04-15 LAB — CBC
Hemoglobin: 7.7 g/dL — ABNORMAL LOW (ref 12.0–15.0)
MCH: 29.5 pg (ref 26.0–34.0)
Platelets: 239 10*3/uL (ref 150–400)
RBC: 2.61 MIL/uL — ABNORMAL LOW (ref 3.87–5.11)
WBC: 14 10*3/uL — ABNORMAL HIGH (ref 4.0–10.5)

## 2012-04-15 MED ORDER — RIVAROXABAN 10 MG PO TABS: 10.0000 mg | ORAL_TABLET | Freq: Every day | ORAL | Status: DC

## 2012-04-15 MED ORDER — POTASSIUM CHLORIDE CRYS ER 20 MEQ PO TBCR
40.0000 meq | EXTENDED_RELEASE_TABLET | Freq: Two times a day (BID) | ORAL | Status: AC
  Administered 2012-04-15 (×2): 40 meq via ORAL
  Filled 2012-04-15 (×2): qty 2

## 2012-04-15 MED ORDER — HYDROMORPHONE HCL 2 MG PO TABS
2.0000 mg | ORAL_TABLET | ORAL | Status: DC | PRN
  Administered 2012-04-15 (×3): 2 mg via ORAL
  Administered 2012-04-16 (×3): 4 mg via ORAL
  Filled 2012-04-15 (×2): qty 2
  Filled 2012-04-15 (×2): qty 1
  Filled 2012-04-15 (×2): qty 2

## 2012-04-15 MED ORDER — POLYSACCHARIDE IRON COMPLEX 150 MG PO CAPS
150.0000 mg | ORAL_CAPSULE | Freq: Two times a day (BID) | ORAL | Status: DC
  Administered 2012-04-15 – 2012-04-16 (×2): 150 mg via ORAL
  Filled 2012-04-15 (×3): qty 1

## 2012-04-15 MED ORDER — HYDROCODONE-ACETAMINOPHEN 5-325 MG PO TABS: 2.0000 | ORAL_TABLET | ORAL | Status: DC | PRN

## 2012-04-15 MED ORDER — POLYSACCHARIDE IRON COMPLEX 150 MG PO CAPS: 150.0000 mg | ORAL_CAPSULE | Freq: Two times a day (BID) | ORAL | Status: DC

## 2012-04-15 MED ORDER — SODIUM CHLORIDE 0.9 % IV BOLUS (SEPSIS)
250.0000 mL | Freq: Once | INTRAVENOUS | Status: AC
  Administered 2012-04-15: 250 mL via INTRAVENOUS

## 2012-04-15 MED ORDER — ACETAMINOPHEN 325 MG PO TABS
650.0000 mg | ORAL_TABLET | Freq: Once | ORAL | Status: AC
  Administered 2012-04-15: 650 mg via ORAL
  Filled 2012-04-15: qty 1

## 2012-04-15 MED ORDER — METHOCARBAMOL 500 MG PO TABS: 500.0000 mg | ORAL_TABLET | Freq: Four times a day (QID) | ORAL | Status: DC | PRN

## 2012-04-15 NOTE — Progress Notes (Signed)
Physical Therapy Treatment Patient Details Name: SONJA MANSEAU MRN: [id number] DOB: 1945/08/08 Today's Date: 04/15/2012 Time: 4098-1191 PT Time Calculation (min): 18 min  PT Assessment / Plan / Recommendation Comments on Treatment Session  Pt just started on PRBCs (to receive 2 units today) so only performed exercises in bed this morning.    Follow Up Recommendations  Home health PT    Barriers to Discharge        Equipment Recommendations  None recommended by PT    Recommendations for Other Services    Frequency     Plan Discharge plan remains appropriate;Frequency remains appropriate    Precautions / Restrictions Precautions Precautions: Knee Required Braces or Orthoses: Knee Immobilizer - Left Knee Immobilizer - Left: Discontinue once straight leg raise with < 10 degree lag Restrictions Weight Bearing Restrictions: No LLE Weight Bearing: Weight bearing as tolerated   Pertinent Vitals/Pain 8/10 L knee pain, medicated during tx, reports pain decreasing soon after exercises with rest    Mobility       Exercises Total Joint Exercises Ankle Circles/Pumps: AROM;Both;20 reps Quad Sets: AROM;Both;20 reps Gluteal Sets: AROM;Both;20 reps Towel Squeeze: AROM;Both;20 reps Short Arc QuadBarbaraann Boys;Left;15 reps Heel Slides: AAROM;Left;15 reps Hip ABduction/ADduction: AROM;Strengthening;Left;15 reps Straight Leg Raises: AAROM;Strengthening;Left;10 reps   PT Diagnosis:    PT Problem List:   PT Treatment Interventions:     PT Goals Acute Rehab PT Goals PT Goal: Perform Home Exercise Program - Progress: Progressing toward goal  Visit Information  Last PT Received On: 04/15/12 Assistance Needed: +1    Subjective Data  Subjective: They want me to keep this arm still.  (pt getting blood today)   Cognition  Overall Cognitive Status: Appears within functional limits for tasks assessed/performed    Balance     End of Session PT - End of Session Activity Tolerance: Patient  limited by fatigue Patient left: in bed;with call bell/phone within reach CPM Left Knee CPM Left Knee: Off   GP     Adeja Sarratt,KATHrine E 04/15/2012, 11:11 AM Pager: [phone]

## 2012-04-15 NOTE — Progress Notes (Signed)
04/15/12 0030 Nursing Brad Dixon PA called reg: patients' urine a dark rust color. Order received for a ua

## 2012-04-15 NOTE — Progress Notes (Signed)
   Subjective: 2 Days Post-Op Procedure(s) (LRB): TOTAL KNEE ARTHROPLASTY (Left) Patient reports pain as mild and moderate.   Patient seen in rounds for Dr. Lequita Halt. Patient was complaining of a headache and also being tired and weak.  Her HGB was down to 7.7 today.  We discussed blood and she was agreeable.  Transfusing two units.  Also noted to have dark urine.  Ordered a UA.  She does have a history of kidney stones and tells me on rounds that she currently has one right now but has been asymptomatic with it.  Her urologist is Dr. Ronal Fear.  Will check the urine to R/O UTI.  Could also have been a slightly traumatic cath and she is also on blood thinner Xarelto.  Will watch today.  Call Urology if needed. Plan is to go Home after hospital stay.  Objective: Vital signs in last 24 hours: Temp:  [98 F (36.7 C)-98.8 F (37.1 C)] 98.7 F (37.1 C) (10/04 0524) Pulse Rate:  [63-102] 63  (10/04 0524) Resp:  [16-18] 16  (10/04 0524) BP: (135-179)/(47-77) 135/47 mmHg (10/04 0524) SpO2:  [96 %-100 %] 96 % (10/04 0524)  Intake/Output from previous day:  Intake/Output Summary (Last 24 hours) at 04/15/12 0849 Last data filed at 04/15/12 1610  Gross per 24 hour  Intake    360 ml  Output   1125 ml  Net   -765 ml    Intake/Output this shift: Total I/O In: 240 [P.O.:240] Out: -   Labs:  Basename 04/15/12 0400 04/14/12 0350  HGB 7.7* 9.0*    Basename 04/15/12 0400 04/14/12 0350  WBC 14.0* 13.0*  RBC 2.61* 3.03*  HCT 22.9* 26.7*  PLT 239 277    Basename 04/15/12 0400 04/14/12 0350  NA 138 137  K 3.3* 3.3*  CL 103 100  CO2 26 23  BUN 19 19  CREATININE 0.84 0.74  GLUCOSE 157* 221*  CALCIUM 8.4 8.2*   No results found for this basename: LABPT:2,INR:2 in the last 72 hours  EXAM General - Patient is Alert, Appropriate and Oriented Extremity - Neurovascular intact Sensation intact distally Dorsiflexion/Plantar flexion intact No cellulitis present Dressing/Incision - clean,  dry, no drainage, healing Motor Function - intact, moving foot and toes well on exam.   Past Medical History  Diagnosis Date  . Chronic kidney disease     KIDNEY STONE  . Arthritis     both knees    Assessment/Plan: 2 Days Post-Op Procedure(s) (LRB): TOTAL KNEE ARTHROPLASTY (Left) Principal Problem:  *OA (osteoarthritis) of knee Active Problems:  Postop Acute blood loss anemia  Postop Hypokalemia  Postop Transfusion  Blood today. Recheck labs in AM. Check UA. Fluids this AM and then blood.  DVT Prophylaxis - Xarelto Weight-Bearing as tolerated to left leg  Romar Woodrick 04/15/2012, 8:49 AM

## 2012-04-16 LAB — BASIC METABOLIC PANEL
CO2: 29 mEq/L (ref 19–32)
Calcium: 8.5 mg/dL (ref 8.4–10.5)
Glucose, Bld: 135 mg/dL — ABNORMAL HIGH (ref 70–99)
Potassium: 3.7 mEq/L (ref 3.5–5.1)
Sodium: 137 mEq/L (ref 135–145)

## 2012-04-16 LAB — CBC
HCT: 29.5 % — ABNORMAL LOW (ref 36.0–46.0)
Hemoglobin: 10 g/dL — ABNORMAL LOW (ref 12.0–15.0)
MCH: 29.2 pg (ref 26.0–34.0)
RBC: 3.42 MIL/uL — ABNORMAL LOW (ref 3.87–5.11)

## 2012-04-16 NOTE — Progress Notes (Signed)
Physical Therapy Treatment Patient Details Name: [name] MRN: [id number] DOB: 07/16/1945 Today's Date: 04/16/2012 Time: 0820-     PT Assessment / Plan / Recommendation Comments on Treatment Session  Progressing well. Stair training completed. Ready to DC home from PT standpoint.    Follow Up Recommendations  Home health PT     Does the patient have the potential to tolerate intense rehabilitation     Barriers to Discharge        Equipment Recommendations  3 in 1 bedside comode    Recommendations for Other Services    Frequency 7X/week   Plan Discharge plan remains appropriate;Frequency remains appropriate    Precautions / Restrictions Precautions Precautions: Knee Required Braces or Orthoses: Knee Immobilizer - Left Knee Immobilizer - Left: Discontinue once straight leg raise with < 10 degree lag Restrictions LLE Weight Bearing: Weight bearing as tolerated   Pertinent Vitals/Pain *5/10 L knee with walking Ice applied, pt premedicated**    Mobility  Bed Mobility Bed Mobility: Supine to Sit Supine to Sit: 4: Min assist Details for Bed Mobility Assistance: assist for L LE,  Transfers Transfers: Stand to Sit;Sit to Stand Sit to Stand: With upper extremity assist;From chair/3-in-1;From bed;6: Modified independent (Device/Increase time) Stand to Sit: With upper extremity assist;With armrests;To chair/3-in-1;6: Modified independent (Device/Increase time) Details for Transfer Assistance: verbal cues for safe technique Ambulation/Gait Ambulation/Gait Assistance: 4: Min guard Ambulation Distance (Feet): 95 Feet Assistive device: Rolling walker Gait Pattern: Step-to pattern;Decreased stance time - left;Antalgic;Trunk flexed Gait velocity: decreased Stairs: Yes Stairs Assistance: 5: Supervision Stairs Assistance Details (indicate cue type and reason): pt stated she has 1 STE in front of home, with 2 rails Stair Management Technique: Forwards;Two rails Number of  Stairs: 2     Exercises Total Joint Exercises Ankle Circles/Pumps: AROM;Both;15 reps Quad Sets: AROM;Both;15 reps Towel Squeeze: AROM;Both;15 reps Short Arc QuadBarbaraann Wang;Left;15 reps Heel Slides: AAROM;Left;15 reps Hip ABduction/ADduction: Strengthening;Left;15 reps;AAROM Straight Leg Raises: AAROM;Strengthening;Left;10 reps   PT Diagnosis:    PT Problem List:   PT Treatment Interventions:     PT Goals Acute Rehab PT Goals PT Goal Formulation: With patient Time For Goal Achievement: 04/21/12 Potential to Achieve Goals: Good Pt will go Supine/Side to Sit: with modified independence PT Goal: Supine/Side to Sit - Progress: Progressing toward goal Pt will go Sit to Supine/Side: with modified independence Pt will go Sit to Stand: with modified independence PT Goal: Sit to Stand - Progress: Met Pt will go Stand to Sit: with modified independence PT Goal: Stand to Sit - Progress: Met Pt will Ambulate: >150 feet;with modified independence;with least restrictive assistive device PT Goal: Ambulate - Progress: Progressing toward goal Pt will Go Up / Down Stairs: 1-2 stairs;with supervision;with least restrictive assistive device PT Goal: Up/Down Stairs - Progress: Met Pt will Perform Home Exercise Program: with supervision, verbal cues required/provided PT Goal: Perform Home Exercise Program - Progress: Progressing toward goal  Visit Information  Last PT Received On: 04/16/12 Assistance Needed: +1    Subjective Data  Subjective: I think I do want a bedside commode for home.   Cognition  Overall Cognitive Status: Appears within functional limits for tasks assessed/performed Arousal/Alertness: Awake/alert Orientation Level: Appears intact for tasks assessed Behavior During Session: [facility] for tasks performed    Balance     End of Session PT - End of Session Equipment Utilized During Treatment: Left knee immobilizer Activity Tolerance: Patient limited by fatigue Patient left: with  call bell/phone within reach;in chair   GP  [name] 04/16/2012, 9:00 AM [phone]

## 2012-04-16 NOTE — Care Management Note (Addendum)
    Page 1 of 2   04/16/2012     9:30:13 AM   CARE MANAGEMENT NOTE 04/16/2012  Patient:  [name], [name]   Account Number:  [id number]  Date Initiated:  04/14/2012  Documentation initiated by:  Colleen Can  Subjective/Objective Assessment:   dx osteoarthritis left knee; total knee replacemnt     Action/Plan:   CM spoke with patient and sister. Plans are for patient to return to her home in [place] where spouse and sister will be caregivers. Already has DME. Wants [facility] for [facility] services   Anticipated DC Date:  04/16/2012   Anticipated DC Plan:  HOME W HOME HEALTH SERVICES  In-house referral  Clinical Social Worker      DC Planning Services  CM consult      [facility] Choice  HOME HEALTH   Choice offered to / List presented to:  C-1 Patient   DME arranged  [facility]      DME agency  [facility]     HH arranged  HH-2 PT      [facility] agency  [facility]   Status of service:  Completed, signed off Medicare Important Message given?   (If response is "NO", the following Medicare IM given date fields will be blank) Date Medicare IM given:   Date Additional Medicare IM given:    Discharge Disposition:  HOME W HOME HEALTH SERVICES  Per UR Regulation:  Reviewed for med. necessity/level of care/duration of stay  If discussed at Long Length of Stay Meetings, dates discussed:    Comments:  04/16/2012  9:15am  Konrad Felix RN, case manager   [phone] Patient is discharged to home today. I have contacted [facility] to confirm her discharge date. I have contacted [facility] for delivery of a bedside commode to the hospital room this morning.  04/14/2012 Raynelle Bring BSN CCM [phone] Spoke  with Sarah at [employer]; they can service patient with start date of 48hrs within d/c date. Faxed orders, face sheet, op note and H& p to [phone]. CM will follow

## 2012-04-16 NOTE — Care Management Note (Signed)
    Page 1 of 1   04/16/2012     9:30:28 AM   CARE MANAGEMENT NOTE 04/16/2012  Patient:  [name], [name]   Account Number:  [id number]  Date Initiated:  04/16/2012  Documentation initiated by:  Konrad Felix  Subjective/Objective Assessment:     Action/Plan:   Anticipated DC Date:  04/16/2012   Anticipated DC Plan:  HOME W HOME HEALTH SERVICES      DC Planning Services  CM consult      Choice offered to / List presented to:     DME arranged  [facility]      DME agency  [facility]     [facility] arranged  HH-1 RN  HH-2 PT      [facility] agency  [facility]   Status of service:  Completed, signed off Medicare Important Message given?   (If response is "NO", the following Medicare IM given date fields will be blank) Date Medicare IM given:   Date Additional Medicare IM given:    Discharge Disposition:  HOME W HOME HEALTH SERVICES  Per UR Regulation:  Reviewed for med. necessity/level of care/duration of stay  If discussed at Long Length of Stay Meetings, dates discussed:    Comments:  04/16/2012  9:15am  Konrad Felix RN, case manager   [phone] Patient is discharged to home today. I have contacted [facility] to confirm her discharge date. I have contacted [facility] for delivery of a bedside commode to the hospital room this morning.

## 2012-04-16 NOTE — Progress Notes (Signed)
   Subjective: 3 Days Post-Op Procedure(s) (LRB): TOTAL KNEE ARTHROPLASTY (Left) Patient reports pain as mild.   Patient is well, and has had no acute complaints or problems Plan is to go Home after hospital stay. Feels much better post-transfusion yesterday. Main complaint is low back pain from being in bed. Eagerly anticipating discharge home today  Objective: Vital signs in last 24 hours: Temp:  [97.4 F (36.3 C)-100 F (37.8 C)] 99 F (37.2 C) (10/05 0621) Pulse Rate:  [82-128] 96  (10/05 0621) Resp:  [15-16] 16  (10/05 0621) BP: (108-164)/(61-90) 150/76 mmHg (10/05 0621) SpO2:  [94 %-96 %] 96 % (10/05 0621)  Intake/Output from previous day:  Intake/Output Summary (Last 24 hours) at 04/16/12 0736 Last data filed at 04/16/12 1610  Gross per 24 hour  Intake 1628.34 ml  Output   3475 ml  Net -1846.66 ml    Intake/Output this shift:    Labs:  Basename 04/16/12 0438 04/15/12 0400 04/14/12 0350  HGB 10.0* 7.7* 9.0*    Basename 04/16/12 0438 04/15/12 0400  WBC 11.8* 14.0*  RBC 3.42* 2.61*  HCT 29.5* 22.9*  PLT 251 239    Basename 04/16/12 0438 04/15/12 0400  NA 137 138  K 3.7 3.3*  CL 101 103  CO2 29 26  BUN 10 19  CREATININE 0.70 0.84  GLUCOSE 135* 157*  CALCIUM 8.5 8.4   No results found for this basename: LABPT:2,INR:2 in the last 72 hours  EXAM General - Patient is Alert, Appropriate and Oriented Extremity - Neurologically intact Neurovascular intact Incision: no drainage No cellulitis present Compartment soft Dressing/Incision - clean, dry, no drainage Motor Function - intact, moving foot and toes well on exam.   Past Medical History  Diagnosis Date  . Chronic kidney disease     KIDNEY STONE  . Arthritis     both knees    Assessment/Plan: 3 Days Post-Op Procedure(s) (LRB): TOTAL KNEE ARTHROPLASTY (Left) Principal Problem:  *OA (osteoarthritis) of knee Active Problems:  Postop Acute blood loss anemia  Postop Hypokalemia  Postop  Transfusion   Up with therapy Discharge home with home health  DVT Prophylaxis - Xarelto Weight-Bearing as tolerated to left leg  Januel Doolan V 04/16/2012, 7:36 AM

## 2012-04-16 NOTE — Progress Notes (Addendum)
Discharged to family auto via wheelchair. Assessment unchanged from am. 

## 2012-04-17 LAB — TYPE AND SCREEN
Antibody Screen: NEGATIVE
Unit division: 0

## 2012-04-19 DIAGNOSIS — R319 Hematuria, unspecified: Secondary | ICD-10-CM

## 2012-04-19 NOTE — Progress Notes (Signed)
Discharge summary sent to payer through [facility]  

## 2012-04-19 NOTE — Discharge Summary (Signed)
Physician Discharge Summary   Patient ID: [name] MRN: [id number] DOB/AGE: 1946/04/16 66 y.o.  Admit date: 04/13/2012 Discharge date: 04/19/2012  Primary Diagnosis: Osteoarthritis Left knee   Admission Diagnoses:  Past Medical History  Diagnosis Date  . Chronic kidney disease     KIDNEY STONE  . Arthritis     both knees   Discharge Diagnoses:   Principal Problem:  *OA (osteoarthritis) of knee Active Problems:  Postop Acute blood loss anemia  Postop Hypokalemia  Postop Transfusion  Hematuria  Estimated Body mass index is 43.27 kg/(m^2) as calculated from the following:   Height as of this encounter: 5\' 1" (1.549 m).   Weight as of this encounter: 229 lb(103.874 kg).  Classification of overweight in adults according to BMI (WHO, 1998)   Procedure:  Procedure(s) (LRB): TOTAL KNEE ARTHROPLASTY (Left)   Consults: None  HPI: [name] is a 66 y.o. year old female with end stage OA of her left knee with progressively worsening pain and dysfunction. She has constant pain, with activity and at rest and significant functional deficits with difficulties even with ADLs. She has had extensive non-op management including analgesics, injections of cortisone and home exercise program, but remains in significant pain with significant dysfunction. Radiographs show bone on bone arthritis lateral and patellofemoral. She presents now for left Total Knee Arthroplasty.   Laboratory Data: Hospital Outpatient Visit on 04/06/2012  Component Date Value Range Status  . MRSA, PCR 04/06/2012 NEGATIVE  NEGATIVE Final  . Staphylococcus aureus 04/06/2012 NEGATIVE  NEGATIVE Final   Comment:                                 The Xpert SA Assay (FDA                          approved for NASAL specimens                          in patients over 64 years of age),                          is one component of                          a comprehensive surveillance   program.  Test performance has                          been validated by [employer] for patients greater                          than or equal to 3 year old.                          It is not intended                          to diagnose infection nor to  guide or monitor treatment.  Marland Kitchen aPTT 04/06/2012 29  24 - 37 seconds Final  . WBC 04/06/2012 7.5  4.0 - 10.5 K/uL Final  . RBC 04/06/2012 4.28  3.87 - 5.11 MIL/uL Final  . Hemoglobin 04/06/2012 12.7  12.0 - 15.0 g/dL Final  . HCT 40/98/1191 37.5  36.0 - 46.0 % Final  . MCV 04/06/2012 87.6  78.0 - 100.0 fL Final  . MCH 04/06/2012 29.7  26.0 - 34.0 pg Final  . MCHC 04/06/2012 33.9  30.0 - 36.0 g/dL Final  . RDW 47/82/9562 12.3  11.5 - 15.5 % Final  . Platelets 04/06/2012 324  150 - 400 K/uL Final  . Sodium 04/06/2012 141  135 - 145 mEq/L Final  . Potassium 04/06/2012 4.0  3.5 - 5.1 mEq/L Final  . Chloride 04/06/2012 104  96 - 112 mEq/L Final  . CO2 04/06/2012 28  19 - 32 mEq/L Final  . Glucose, Bld 04/06/2012 97  70 - 99 mg/dL Final  . BUN 13/02/6577 18  6 - 23 mg/dL Final  . Creatinine, Ser 04/06/2012 0.79  0.50 - 1.10 mg/dL Final  . Calcium 46/96/2952 9.3  8.4 - 10.5 mg/dL Final  . Total Protein 04/06/2012 7.4  6.0 - 8.3 g/dL Final  . Albumin 84/13/2440 3.9  3.5 - 5.2 g/dL Final  . AST 05/09/2535 19  0 - 37 U/L Final  . ALT 04/06/2012 20  0 - 35 U/L Final  . Alkaline Phosphatase 04/06/2012 115  39 - 117 U/L Final  . Total Bilirubin 04/06/2012 0.8  0.3 - 1.2 mg/dL Final  . GFR calc non Af Amer 04/06/2012 84* >90 mL/min Final  . GFR calc Af Amer 04/06/2012 >90  >90 mL/min Final   Comment:                                 The eGFR has been calculated                          using the CKD EPI equation.                          This calculation has not been                          validated in all clinical                          situations.                          eGFR's  persistently                          <90 mL/min signify                          possible Chronic Kidney Disease.  Marland Kitchen Prothrombin Time 04/06/2012 13.4  11.6 - 15.2 seconds Final  . INR 04/06/2012 1.03  0.00 - 1.49 Final  . ABO/RH(D) 04/06/2012 AB POS   Final  . Antibody Screen 04/06/2012 NEG   Final  . Sample Expiration 04/06/2012 04/16/2012   Final  . Unit Number 04/06/2012 [id number]   Final  . Blood Component Type  04/06/2012 RED CELLS,LR   Final  . Unit division 04/06/2012 00   Final  . Status of Unit 04/06/2012 ISSUED,FINAL   Final  . Transfusion Status 04/06/2012 OK TO TRANSFUSE   Final  . Crossmatch Result 04/06/2012 Compatible   Final  . Unit Number 04/06/2012 [id number]   Final  . Blood Component Type 04/06/2012 RED CELLS,LR   Final  . Unit division 04/06/2012 00   Final  . Status of Unit 04/06/2012 ISSUED,FINAL   Final  . Transfusion Status 04/06/2012 OK TO TRANSFUSE   Final  . Crossmatch Result 04/06/2012 Compatible   Final  . Color, Urine 04/06/2012 YELLOW  YELLOW Final  . APPearance 04/06/2012 CLEAR  CLEAR Final  . Specific Gravity, Urine 04/06/2012 1.010  1.005 - 1.030 Final  . pH 04/06/2012 6.5  5.0 - 8.0 Final  . Glucose, UA 04/06/2012 NEGATIVE  NEGATIVE mg/dL Final  . Hgb urine dipstick 04/06/2012 MODERATE* NEGATIVE Final  . Bilirubin Urine 04/06/2012 NEGATIVE  NEGATIVE Final  . Ketones, ur 04/06/2012 NEGATIVE  NEGATIVE mg/dL Final  . Protein, ur 95/28/4132 NEGATIVE  NEGATIVE mg/dL Final  . Urobilinogen, UA 04/06/2012 0.2  0.0 - 1.0 mg/dL Final  . Nitrite 44/07/270 NEGATIVE  NEGATIVE Final  . Leukocytes, UA 04/06/2012 SMALL* NEGATIVE Final  . Squamous Epithelial / LPF 04/06/2012 FEW* RARE Final  . WBC, UA 04/06/2012 7-10  <3 WBC/hpf Final  . RBC / HPF 04/06/2012 3-6  <3 RBC/hpf Final  . Bacteria, UA 04/06/2012 FEW* RARE Final  . Daryll Drown 04/06/2012 MUCOUS PRESENT   Final  . ABO/RH(D) 04/06/2012 AB POS   Final   No results found for this basename: HGB:5  in the last 72 hours No results found for this basename: WBC:2,RBC:2,HCT:2,PLT:2 in the last 72 hours No results found for this basename: NA:2,K:2,CL:2,CO2:2,BUN:2,CREATININE:2,GLUCOSE:2,CALCIUM:2 in the last 72 hours No results found for this basename: LABPT:2,INR:2 in the last 72 hours  X-Rays:No results found.  EKG: Orders placed during the hospital encounter of 03/18/11  . EKG  . EKG  . EKG     Hospital Course:  INSIYA RUDIGER is a 66 y.o. who was admitted to [facility]. They were brought to the operating room on 04/13/2012 and underwent Procedure(s): TOTAL KNEE ARTHROPLASTY.  Patient tolerated the procedure well and was later transferred to the recovery room and then to the orthopaedic floor for postoperative care.  They were given PO and IV analgesics for pain control following their surgery.  They were given 24 hours of postoperative antibiotics of  Anti-infectives     Start     Dose/Rate Route Frequency Ordered Stop   04/13/12 1930   ceFAZolin (ANCEF) IVPB 2 g/50 mL premix        2 g 100 mL/hr over 30 Minutes Intravenous Every 6 hours 04/13/12 1642 04/14/12 0155   04/13/12 1039   ceFAZolin (ANCEF) 3 g in dextrose 5 % 50 mL IVPB        3 g 160 mL/hr over 30 Minutes Intravenous 60 min pre-op 04/13/12 1039 04/13/12 1300         and started on DVT prophylaxis in the form of Xarelto.   PT and OT were ordered for total joint protocol.  Discharge planning consulted to help with postop disposition and equipment needs.  Patient had a rough night on the evening of surgery due to pain but started to get up OOB with therapy on day one.  Hemovac drain was pulled without difficulty.   On POD  2, the patient was complaining of a headache and also being tired and weak. Her HGB was down to 7.7 today. Discussed blood and she was agreeable. Transfused two units. Also noted to have dark urine. Ordered a UA. She does have a history of kidney stones and told us on rounds that she currently  had one but has been asymptomatic with it. Her urologist is Dr. Ronal Fear. Check the urine to R/O UTI. Could also have been a slightly traumatic cath and she is also on blood thinner Xarelto. Decided to monitor. Call Urology if needed.  Continued to work with therapy into day two.  Dressing was changed on day two and the incision was healing well.  By day three, main complaint was low back pain from being in bed. Eagerly anticipating discharge home.  The patient had progressed with therapy and meeting their goals.  Incision was healing well.  Patient was seen in rounds and was ready to go home.   Discharge Medications: Prior to Admission medications   Medication Sig Start Date End Date Taking? Authorizing Provider  HYDROcodone-acetaminophen (NORCO/VICODIN) 5-325 MG per tablet Take 2 tablets by mouth every 4 (four) hours as needed. 04/15/12   Alexzandrew Julien Girt, PA  iron polysaccharides (NIFEREX) 150 MG capsule Take 1 capsule (150 mg total) by mouth 2 (two) times daily. 04/15/12   Alexzandrew Julien Girt, PA  methocarbamol (ROBAXIN) 500 MG tablet Take 1 tablet (500 mg total) by mouth every 6 (six) hours as needed. 04/15/12   Alexzandrew Julien Girt, PA  rivaroxaban (XARELTO) 10 MG TABS tablet Take 1 tablet (10 mg total) by mouth daily with breakfast. Take Xarelto for two and a half more weeks, then discontinue Xarelto. 04/15/12   Alexzandrew Perkins, PA    Diet: Regular diet Activity:WBAT Follow-up:in 2 weeks Disposition - Home Discharged Condition: good   Discharge Orders    Future Orders Please Complete By Expires   Diet general      Call MD / Call 911      Comments:   If you experience chest pain or shortness of breath, CALL 911 and be transported to the hospital emergency room.  If you develope a fever above 101 F, pus (white drainage) or increased drainage or redness at the wound, or calf pain, call your surgeon's office.   Discharge instructions      Comments:   Pick up stool softner and laxative  for home. Do not submerge incision under water. May shower. Continue to use ice for pain and swelling from surgery.  Take Xarelto for two and a half more weeks, then discontinue Xarelto.   Constipation Prevention      Comments:   Drink plenty of fluids.  Prune juice may be helpful.  You may use a stool softener, such as Colace (over the counter) 100 mg twice a day.  Use MiraLax (over the counter) for constipation as needed.   Increase activity slowly as tolerated      Patient may shower      Comments:   You may shower without a dressing once there is no drainage.  Do not wash over the wound.  If drainage remains, do not shower until drainage stops.   Driving restrictions      Comments:   No driving until released by the physician.   Lifting restrictions      Comments:   No lifting until released by the physician.   TED hose      Comments:   Use stockings (TED hose)  for 3 weeks on both leg(s).  You may remove them at night for sleeping.   Change dressing      Comments:   Change dressing daily with sterile 4 x 4 inch gauze dressing and apply TED hose. Do not submerge the incision under water.   Do not put a pillow under the knee. Place it under the heel.      Do not sit on low chairs, stoools or toilet seats, as it may be difficult to get up from low surfaces          Medication List     As of 04/19/2012  6:58 PM    TAKE these medications         HYDROcodone-acetaminophen 5-325 MG per tablet   Commonly known as: NORCO/VICODIN   Take 2 tablets by mouth every 4 (four) hours as needed.      iron polysaccharides 150 MG capsule   Commonly known as: NIFEREX   Take 1 capsule (150 mg total) by mouth 2 (two) times daily.      methocarbamol 500 MG tablet   Commonly known as: ROBAXIN   Take 1 tablet (500 mg total) by mouth every 6 (six) hours as needed.      rivaroxaban 10 MG Tabs tablet   Commonly known as: XARELTO   Take 1 tablet (10 mg total) by mouth daily with breakfast. Take  Xarelto for two and a half more weeks, then discontinue Xarelto.           Follow-up Information    Follow up with Loanne Drilling, MD. Schedule an appointment as soon as possible for a visit on 04/26/2012. (Call Monday to make the appointment)    Contact information:   [facility] [address] [place] [address] [phone]          Signed: Patrica Duel 04/19/2012, 6:58 PM

## 2012-04-22 ENCOUNTER — Ambulatory Visit (HOSPITAL_COMMUNITY)
Admission: RE | Admit: 2012-04-22 | Discharge: 2012-04-22 | Disposition: A | Discharge status: Home / Self Care | Payer: Medicare Other | Source: Ambulatory Visit | Attending: Orthopedic Surgery | Admitting: Orthopedic Surgery

## 2012-04-22 DIAGNOSIS — R52 Pain, unspecified: Secondary | ICD-10-CM

## 2012-04-22 DIAGNOSIS — M79609 Pain in unspecified limb: Secondary | ICD-10-CM

## 2012-04-22 DIAGNOSIS — R609 Edema, unspecified: Secondary | ICD-10-CM

## 2012-04-22 DIAGNOSIS — M179 Osteoarthritis of knee, unspecified: Secondary | ICD-10-CM

## 2012-04-22 DIAGNOSIS — M1710 Unilateral primary osteoarthritis, unspecified knee: Secondary | ICD-10-CM

## 2012-04-22 NOTE — Progress Notes (Signed)
Left:  No evidence of DVT, superficial thrombosis, or Baker's cyst.  Right:  Negative DVT in the common femoral vein.

## 2012-05-02 ENCOUNTER — Ambulatory Visit: Payer: Medicare Other | Attending: Orthopedic Surgery | Admitting: Physical Therapy

## 2012-05-02 DIAGNOSIS — M545 Low back pain, unspecified: Secondary | ICD-10-CM | POA: Insufficient documentation

## 2012-05-02 DIAGNOSIS — M25569 Pain in unspecified knee: Secondary | ICD-10-CM | POA: Insufficient documentation

## 2012-05-02 DIAGNOSIS — R262 Difficulty in walking, not elsewhere classified: Secondary | ICD-10-CM | POA: Insufficient documentation

## 2012-05-02 DIAGNOSIS — IMO0001 Reserved for inherently not codable concepts without codable children: Secondary | ICD-10-CM | POA: Insufficient documentation

## 2012-05-05 ENCOUNTER — Ambulatory Visit: Payer: Medicare Other | Admitting: Physical Therapy

## 2012-05-09 ENCOUNTER — Ambulatory Visit: Payer: Medicare Other | Admitting: Physical Therapy

## 2012-05-10 ENCOUNTER — Ambulatory Visit: Payer: Medicare Other | Admitting: Physical Therapy

## 2012-05-11 ENCOUNTER — Encounter (HOSPITAL_COMMUNITY): Payer: Self-pay | Admitting: Pharmacy Technician

## 2012-05-11 ENCOUNTER — Other Ambulatory Visit: Payer: Self-pay | Admitting: Urology

## 2012-05-12 ENCOUNTER — Ambulatory Visit: Payer: Medicare Other | Admitting: Physical Therapy

## 2012-05-12 ENCOUNTER — Encounter (HOSPITAL_COMMUNITY): Payer: Self-pay | Admitting: *Deleted

## 2012-05-12 NOTE — Progress Notes (Signed)
Pt to arrive 0800 on 05/16/12 to Short Stay for [facility]. To bring blue folder from [facility], insurance card, and responsible driver. Reviewed need for laxative on Sunday. No aspirin or ibuprofen products. Pt able to teach back. She will take laxative on Sat as she states her bowels work funny. Still on walker from knee replacement on 04/13/12.

## 2012-05-13 NOTE — H&P (Signed)
History of Present Illness                   She has a history of medullary sponge kidney and kidney stones and has undergone lithotripsy twice in the past for stones on the right-hand side. She has also required ureteroscopy for residual fragments after her first lithotripsy. On a CT scan done in 1/10 she had no right renal calculi and a 2.4 x 1.2 cm stone in the upper portion of her right kidney without evidence of obstruction. Hounsfield units were ~800-900. A KUB in 9/12 revealed the stone had increased in size to 3 x 3 cm. 24 hour urine: She was found to have no significant abnormality. Stone analysis: calcium oxalate and calcium phosphate.  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  Interval history: She continues to have a lot of back pain and is hoping to have back surgery but her neurosurgeon wanted her to have her knee fixed and so she has been continuing to have a lot of pain in the back.   Past Medical History Problems  1. History of  Anxiety (Symptom) 300.00 2. History of  Arthritis V13.4  Surgical History Problems  1. History of  Back Surgery 2. History of  Cholecystectomy 3. History of  Knee Replacement Left 4. History of  Lithotripsy Right 5. History of  Lithotripsy Right 6. History of  Rotator Cuff Repair 7. History of  Spine Repair  Current Meds 1. Hydrocodone-Acetaminophen 10-325 MG Oral Tablet; Therapy: 18Apr2013 to 2. Xarelto 10 MG Oral Tablet; Therapy: 04Oct2013 to  Allergies Medication  1. Sulfa Drugs  Family History Problems  1. Paternal history of  Death In The Family Father 58 - lung cancer 2. Paternal history of  Death In The Family Mother 61 lung cancer 3. Maternal history of  Diabetes Mellitus V18.0 4. Maternal history of  Heart Disease V17.49 5. Maternal history of  Lung Cancer  V16.1 6. Paternal history of  Lung Cancer V16.1 Denied  7. Paternal history of  Family Health Status Number Of Children 2 daughters  Social History Problems  1. Caffeine Use x3 2. Marital History - Currently Married 3. 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 History of  Alcohol Use 6. History of  Tobacco Use   Vitals Vital Signs Blood Pressure Blood Pressure:  128/88 Temperature Temperature: 97.14F Pulse Heart Rate: 91  Temperature Temperature: 97.14F  Physical Exam Constitutional: Well nourished and well developed. No acute distress.  ENT: The ears and nose are normal in appearance.  Neck: The appearance of the neck is normal and no neck mass is present.  Pulmonary: No respiratory distress and normal respiratory rhythm and effort.  Cardiovascular: Heart rate and rhythm are normal. No peripheral edema.  Abdomen: The abdomen is soft and nontender. No masses are palpated. No CVA tenderness. No hernias are palpable. No hepatosplenomegaly noted.  Lymphatics: The femoral and inguinal nodes are not enlarged or tender.  Skin: Normal skin turgor, no visible rash and no visible skin lesions.  Neuro/Psych: Mood and affect are appropriate.   Assessment Assessed  1. Nephrolithiasis Of The Left Kidney 592.0 2. Rule out  Acute Cystitis 595.0 3. Hydronephrosis On The Right 591 4. Proximal Ureteral Stone On The Right 592.1    I went over the results of her CT scan which has revealed a  left renal calculus in the upper pole that appears unchanged. What she also has is a 1 cm stone in the proximal right ureter with moderate to marked hydronephrosis including some blunting of the calyces and what appears to be some possible loss of parenchyma. The stone had Hounsfield units of approximately 700. I therefore have discussed the treatment options with her. It is obvious that the stone has been present for some time. It certainly will require therapy and we discussed the treatment  options including ureteroscopy versus lithotripsy. I discussed the benefits and limitations of each of these procedures as well as the risks and complications.   Plan    1. She's going to need to stop her aspirin prior to her lithotripsy. 2. I will schedule her for her lithotripsy of her right  proximal ureteral stone. She continues to have a lot of back pain but she supposed to have back surgery.  3. Her urine will be cultured today.

## 2012-05-16 ENCOUNTER — Ambulatory Visit (HOSPITAL_COMMUNITY): Payer: Medicare Other

## 2012-05-16 ENCOUNTER — Encounter (HOSPITAL_COMMUNITY)
Admission: RE | Disposition: A | Discharge status: Home / Self Care | Payer: Self-pay | Source: Ambulatory Visit | Attending: Urology

## 2012-05-16 ENCOUNTER — Ambulatory Visit (HOSPITAL_COMMUNITY)
Admission: RE | Admit: 2012-05-16 | Discharge: 2012-05-16 | Disposition: A | Discharge status: Home / Self Care | Payer: Medicare Other | Source: Ambulatory Visit | Attending: Urology | Admitting: Urology

## 2012-05-16 ENCOUNTER — Encounter (HOSPITAL_COMMUNITY): Payer: Self-pay | Admitting: *Deleted

## 2012-05-16 DIAGNOSIS — N200 Calculus of kidney: Secondary | ICD-10-CM | POA: Diagnosis present

## 2012-05-16 DIAGNOSIS — N201 Calculus of ureter: Secondary | ICD-10-CM | POA: Diagnosis present

## 2012-05-16 DIAGNOSIS — M549 Dorsalgia, unspecified: Secondary | ICD-10-CM | POA: Insufficient documentation

## 2012-05-16 DIAGNOSIS — Z79899 Other long term (current) drug therapy: Secondary | ICD-10-CM | POA: Insufficient documentation

## 2012-05-16 DIAGNOSIS — N1330 Unspecified hydronephrosis: Secondary | ICD-10-CM | POA: Diagnosis present

## 2012-05-16 DIAGNOSIS — Q615 Medullary cystic kidney: Secondary | ICD-10-CM | POA: Insufficient documentation

## 2012-05-16 DIAGNOSIS — N3946 Mixed incontinence: Secondary | ICD-10-CM | POA: Insufficient documentation

## 2012-05-16 HISTORY — PX: EXTRACORPOREAL SHOCK WAVE LITHOTRIPSY: SHX1557

## 2012-05-16 SURGERY — LITHOTRIPSY, ESWL
Anesthesia: LOCAL | Laterality: Right

## 2012-05-16 MED ORDER — DIPHENHYDRAMINE HCL 25 MG PO CAPS
25.0000 mg | ORAL_CAPSULE | ORAL | Status: AC
  Administered 2012-05-16: 25 mg via ORAL
  Filled 2012-05-16: qty 1

## 2012-05-16 MED ORDER — TAMSULOSIN HCL 0.4 MG PO CAPS: 0.4000 mg | ORAL_CAPSULE | ORAL | Status: DC

## 2012-05-16 MED ORDER — SODIUM CHLORIDE 0.9 % IV SOLN
INTRAVENOUS | Status: DC
  Administered 2012-05-16: 09:00:00 via INTRAVENOUS

## 2012-05-16 MED ORDER — CIPROFLOXACIN HCL 500 MG PO TABS
500.0000 mg | ORAL_TABLET | ORAL | Status: DC
  Filled 2012-05-16: qty 1

## 2012-05-16 MED ORDER — DIAZEPAM 5 MG PO TABS
10.0000 mg | ORAL_TABLET | ORAL | Status: AC
  Administered 2012-05-16: 10 mg via ORAL
  Filled 2012-05-16: qty 2

## 2012-05-16 MED ORDER — CIPROFLOXACIN IN D5W 200 MG/100ML IV SOLN
200.0000 mg | INTRAVENOUS | Status: AC
  Administered 2012-05-16: 200 mg via INTRAVENOUS
  Filled 2012-05-16 (×2): qty 100

## 2012-05-16 NOTE — Interval H&P Note (Signed)
History and Physical Interval Note:  05/16/2012 10:12 AM  [name]  has presented today for surgery, with the diagnosis of right upper ureteral stone   The various methods of treatment have been discussed with the patient and family. After consideration of risks, benefits and other options for treatment, the patient has consented to  Procedure(s) (LRB) with comments: EXTRACORPOREAL SHOCK WAVE LITHOTRIPSY (ESWL) (Right) as a surgical intervention .  The patient's history has been reviewed, patient examined, no change in status, stable for surgery.  I have reviewed the patient's chart and labs.  Questions were answered to the patient's satisfaction.     [place]

## 2012-05-16 NOTE — Op Note (Signed)
See [facility] OP note scanned into chart. 

## 2012-05-18 ENCOUNTER — Ambulatory Visit: Payer: Medicare Other | Attending: Orthopedic Surgery | Admitting: Physical Therapy

## 2012-05-18 DIAGNOSIS — R262 Difficulty in walking, not elsewhere classified: Secondary | ICD-10-CM | POA: Insufficient documentation

## 2012-05-18 DIAGNOSIS — IMO0001 Reserved for inherently not codable concepts without codable children: Secondary | ICD-10-CM | POA: Insufficient documentation

## 2012-05-18 DIAGNOSIS — M545 Low back pain, unspecified: Secondary | ICD-10-CM | POA: Insufficient documentation

## 2012-05-18 DIAGNOSIS — M25569 Pain in unspecified knee: Secondary | ICD-10-CM | POA: Insufficient documentation

## 2012-05-24 ENCOUNTER — Ambulatory Visit: Payer: Medicare Other | Admitting: Physical Therapy

## 2012-05-26 ENCOUNTER — Ambulatory Visit: Payer: Medicare Other | Admitting: Physical Therapy

## 2012-05-31 ENCOUNTER — Ambulatory Visit: Payer: Medicare Other | Admitting: Physical Therapy

## 2012-06-02 ENCOUNTER — Ambulatory Visit: Payer: Medicare Other | Admitting: Physical Therapy

## 2012-07-15 ENCOUNTER — Other Ambulatory Visit: Payer: Self-pay | Admitting: Orthopedic Surgery

## 2012-07-15 MED ORDER — DEXAMETHASONE SODIUM PHOSPHATE 10 MG/ML IJ SOLN: 10.0000 mg | Freq: Once | INTRAMUSCULAR | Status: DC

## 2012-07-15 MED ORDER — BUPIVACAINE LIPOSOME 1.3 % IJ SUSP: 20.0000 mL | Freq: Once | INTRAMUSCULAR | Status: DC

## 2012-07-15 NOTE — Progress Notes (Signed)
Preoperative surgical orders have been place into the Epic hospital system for [name] on 07/15/2012, 7:41 AM  by Patrica Duel for surgery on 08/08/2012.  Preop Total Knee orders including Experal, IV Tylenol, and IV Decadron as long as there are no contraindications to the above medications. [name], PA-C

## 2012-07-26 ENCOUNTER — Encounter (HOSPITAL_COMMUNITY): Payer: Self-pay | Admitting: Pharmacy Technician

## 2012-07-29 ENCOUNTER — Encounter (HOSPITAL_COMMUNITY)
Admission: RE | Admit: 2012-07-29 | Discharge: 2012-07-29 | Disposition: A | Discharge status: Home / Self Care | Payer: Medicare Other | Source: Ambulatory Visit | Attending: Orthopedic Surgery | Admitting: Orthopedic Surgery

## 2012-07-29 ENCOUNTER — Encounter (HOSPITAL_COMMUNITY): Payer: Self-pay

## 2012-07-29 HISTORY — DX: Carpal tunnel syndrome, bilateral upper limbs: G56.03

## 2012-07-29 HISTORY — DX: Personal history of urinary calculi: Z87.442

## 2012-07-29 LAB — URINALYSIS, ROUTINE W REFLEX MICROSCOPIC
Glucose, UA: NEGATIVE mg/dL
pH: 5.5 (ref 5.0–8.0)

## 2012-07-29 LAB — COMPREHENSIVE METABOLIC PANEL
ALT: 15 U/L (ref 0–35)
AST: 19 U/L (ref 0–37)
Albumin: 4 g/dL (ref 3.5–5.2)
Calcium: 9.3 mg/dL (ref 8.4–10.5)
Sodium: 140 mEq/L (ref 135–145)
Total Protein: 7.4 g/dL (ref 6.0–8.3)

## 2012-07-29 LAB — CBC
MCH: 29.2 pg (ref 26.0–34.0)
MCHC: 33.2 g/dL (ref 30.0–36.0)
Platelets: 323 10*3/uL (ref 150–400)
RBC: 4.32 MIL/uL (ref 3.87–5.11)

## 2012-07-29 LAB — SURGICAL PCR SCREEN: MRSA, PCR: NEGATIVE

## 2012-07-29 LAB — URINE MICROSCOPIC-ADD ON

## 2012-07-29 NOTE — Patient Instructions (Addendum)
[name]  07/29/2012                           YOUR PROCEDURE IS SCHEDULED ON:  08/08/12               PLEASE REPORT TO SHORT STAY CENTER AT :  110:30AM               CALL THIS NUMBER IF ANY PROBLEMS THE DAY OF SURGERY :               [phone]                      REMEMBER:   Do not eat food  AFTER MIDNIGHT  May have clear liquids UNTIL 6 HOURS BEFORE SURGERY (8:00 AM)  Clear liquids include soda, tea, black coffee, apple or grape juice, broth.  Take these medicines the morning of surgery with A SIP OF WATER: HYDROCODONE IF NEEDED   Do not wear jewelry, make-up   Do not wear lotions, powders, or perfumes.   Do not shave legs or underarms 12 hrs. before surgery (men may shave face)  Do not bring valuables to the hospital.  Contacts, dentures or bridgework may not be worn into surgery.  Leave suitcase in the car. After surgery it may be brought to your room.  For patients admitted to the hospital more than one night, checkout time is 11:00                          The day of discharge.   Patients discharged the day of surgery will not be allowed to drive home                             If going home same day of surgery, must have someone stay with you first                           24 hrs at home and arrange for some one to drive you home from hospital.    Special Instructions:   Please read over the following fact sheets that you were given:               1. MRSA  INFORMATION                      2. [place] PREPARING FOR SURGERY SHEET               3. INCENTIVE SPIROMETER                                                X_____________________________________________________________________        Failure to follow these instructions may result in cancellation of your surgery

## 2012-07-30 LAB — URINE CULTURE

## 2012-08-05 ENCOUNTER — Encounter (HOSPITAL_COMMUNITY): Payer: Self-pay | Admitting: *Deleted

## 2012-08-05 NOTE — Progress Notes (Signed)
Pt aware surgery time changed to 1235 pt aware npo after midnight arrive 1000 am [facility] short stay 08-08-12

## 2012-08-07 ENCOUNTER — Other Ambulatory Visit: Payer: Self-pay | Admitting: Orthopedic Surgery

## 2012-08-07 NOTE — H&P (Signed)
[name]  DOB: 07/26/1945 Married / Language: English / Race: White Female  Date of Admission:  08/08/2012  Chief Complaint:  Right Knee Pain  History of Present Illness The patient is a 66 year old female who comes in for a preoperative History and Physical. The patient is scheduled for a right total knee arthroplasty to be performed by Dr. Frank V. Aluisio, MD at [facility] on 08/08/2012. The patient is a 66 year old female presenting for a post-operative visit. The patient comes in postop from left total knee arthroplasty. The patient states that she is getting better at this time. The pain is under fair control at this time and describe their pain as mild to moderate. They are currently on Hydrocodone (10/325) for their pain. The patient is currently taking 5 pill(s) per day. The patient is currently doing home exercise program. The patient feels that they are progressing well at this time. She states the right knee is what is giving her more trouble than the left now. She still has some burning laterally on the left but also has a lot of back problems and gets burning all the way down to her foot. Her right knee is painful all the time. She is ready now to get the right knee fixed. They have been treated conservatively in the past for the above stated problem and despite conservative measures, they continue to have progressive pain and severe functional limitations and dysfunction. They have failed non-operative management including home exercise, medications. It is felt that they would benefit from undergoing total joint replacement. Risks and benefits of the procedure have been discussed with the patient and they elect to proceed with surgery. There are no active contraindications to surgery such as ongoing infection or rapidly progressive neurological disease.   Problem List S/P Left total knee arthroplasty (V43.65) Primary osteoarthritis of one knee  (715.16)   Allergies Ultram *ANALGESICS - OPIOID*. sick   Family History Diabetes Mellitus. mother Heart Disease. mother Hypertension. mother Osteoarthritis. mother and grandmother mothers side Cancer. mother and father   Social History Current work status. retired Alcohol use. never consumed alcohol No alcohol use Children. 2 Illicit drug use. no Tobacco use. Never smoker. never smoker Exercise. Exercises never Number of flights of stairs before winded. less than 1 Living situation. live with spouse Previously in rehab. no Pain Contract. no Tobacco / smoke exposure. no Marital status. married Drug/Alcohol Rehab (Currently). no Post-Surgical Plans. Plan is to go home. Advance Directives. Living Will   Medication History Hydrocodone-Acetaminophen ( Oral) Specific dose unknown - Active.   Past Surgical History Hysterectomy. partial (non-cancerous) Arthroscopy of Knee. bilateral Straighten Nasal Septum Neck Disc Surgery Gallbladder Surgery. open Spinal Decompression. neck and lower back Rotator Cuff Repair. bilateral Spinal Surgery Total Knee Replacement - Left. Date: 04/13/2012.   Medical History Osteoarthritis Kidney Stone. Currently has a large left renal calculus. Chronic Pain Tinnitus Measles Obesity Degenerative Disc Disease Lumbago (724.2). 03/10/2010 Transfusion history. October 2013 with previous total knee   Review of Systems General:Present- Fatigue. Not Present- Chills, Fever, Night Sweats, Appetite Loss, Feeling sick, Weight Gain and Weight Loss. Skin:Not Present- Itching, Rash, Skin Color Changes, Ulcer, Psoriasis and Change in Hair or Nails. HEENT:Not Present- Sensitivity to light, Nose Bleed, Visual Loss, Decreased Hearing and Ringing in the Ears. Neck:Not Present- Swollen Glands and Neck Mass. Respiratory:Not Present- Shortness of breath, Snoring, Chronic Cough and Bloody  sputum. Cardiovascular:Not Present- Shortness of Breath, Chest Pain, Swelling of Extremities, Leg Cramps   and Palpitations. Gastrointestinal:Not Present- Bloody Stool, Heartburn, Abdominal Pain, Vomiting, Nausea and Incontinence of Stool. Female Genitourinary:Not Present- Blood in Urine, Irregular/missing periods, Frequency, Incontinence and Nocturia. Musculoskeletal:Present- Muscle Weakness, Muscle Pain, Joint Stiffness, Joint Pain and Back Pain. Not Present- Joint Swelling. Neurological:Not Present- Tingling, Numbness, Burning, Tremor, Headaches and Dizziness. Psychiatric:Not Present- Anxiety, Depression and Memory Loss. Endocrine:Not Present- Cold Intolerance, Heat Intolerance, Excessive hunger and Excessive Thirst. Hematology:Not Present- Abnormal Bleeding, Abnormal bruising, Anemia and Blood Clots.   Vitals Weight: 221.7 lb Height: 61 in Weight was reported by patient. Height was reported by patient. Body Surface Area: 2.08 m Body Mass Index: 41.89 kg/m Pulse: 84 (Regular) Resp.: 16 (Unlabored) BP: 148/92 (Sitting, Left Arm, Standard)    Physical Exam The physical exam findings are as follows:   General Mental Status - Alert, cooperative and good historian. General Appearance- pleasant. Not in acute distress. Orientation- Oriented X3. Build & Nutrition- Well nourished and Well developed.   Head and Neck Head- normocephalic, atraumatic . Neck Global Assessment- supple. no bruit auscultated on the right and no bruit auscultated on the left.   Eye Pupil- Bilateral- Regular and Round. Motion- Bilateral- EOMI.   Chest and Lung Exam Auscultation: Breath sounds:- clear at anterior chest wall and - clear at posterior chest wall. Adventitious sounds:- No Adventitious sounds.   Cardiovascular Auscultation:Rhythm- Regular rate and rhythm. Heart Sounds- S1 WNL and S2 WNL. Murmurs & Other Heart Sounds:Auscultation of the heart  reveals - No Murmurs.   Abdomen Inspection:Contour- Generalized moderate distention. Palpation/Percussion:Tenderness- Abdomen is non-tender to palpation. Rigidity (guarding)- Abdomen is soft. Auscultation:Auscultation of the abdomen reveals - Bowel sounds normal.   Female Genitourinary Not done, not pertinent to present illness  Musculoskeletal On exam she is alert and oriented in no apparent distress. Her left knee looks fine. Range of motion is 5 to 120 degrees. There is no swelling, tenderness or instability. Right knee range about 10 to 120. Marked crepitus on range of motion. Tender medial greater than lateral. No instability.  RADIOGRAPHS: AP and lateral left knee show her prosthesis to be in excellent position with no periprosthetic abnormalities. AP and lateral shows Right knee bone on bone medial and patellofemoral.  Assessment & Plan Primary osteoarthritis of one knee (715.16) Impression: Right Knee  Note: Plan is for a Right Total Knee Replacement by Dr. Aluisio.  Plan is to go home.  Urologist - Dr. Mark Ottlein Cardiolofy - Dr. Tilley  Signed electronically by DREW L Raynie Steinhaus, PA-C  

## 2012-08-08 ENCOUNTER — Encounter (HOSPITAL_COMMUNITY): Payer: Self-pay | Admitting: *Deleted

## 2012-08-08 ENCOUNTER — Encounter (HOSPITAL_COMMUNITY)
Admission: RE | Disposition: A | Discharge status: Home Health | Payer: Self-pay | Source: Ambulatory Visit | Attending: Orthopedic Surgery

## 2012-08-08 ENCOUNTER — Encounter (HOSPITAL_COMMUNITY): Payer: Self-pay | Admitting: Anesthesiology

## 2012-08-08 ENCOUNTER — Inpatient Hospital Stay (HOSPITAL_COMMUNITY)
Admission: RE | Admit: 2012-08-08 | Discharge: 2012-08-10 | DRG: 470 | Disposition: A | Discharge status: Home Health | Payer: Medicare Other | Source: Ambulatory Visit | Attending: Orthopedic Surgery | Admitting: Orthopedic Surgery

## 2012-08-08 ENCOUNTER — Inpatient Hospital Stay (HOSPITAL_COMMUNITY): Payer: Medicare Other | Admitting: Anesthesiology

## 2012-08-08 DIAGNOSIS — Z96659 Presence of unspecified artificial knee joint: Secondary | ICD-10-CM

## 2012-08-08 DIAGNOSIS — M1710 Unilateral primary osteoarthritis, unspecified knee: Secondary | ICD-10-CM

## 2012-08-08 DIAGNOSIS — Z6841 Body Mass Index (BMI) 40.0 and over, adult: Secondary | ICD-10-CM

## 2012-08-08 DIAGNOSIS — D62 Acute posthemorrhagic anemia: Secondary | ICD-10-CM | POA: Diagnosis present

## 2012-08-08 DIAGNOSIS — Z01812 Encounter for preprocedural laboratory examination: Secondary | ICD-10-CM

## 2012-08-08 HISTORY — PX: TOTAL KNEE ARTHROPLASTY: SHX125

## 2012-08-08 LAB — TYPE AND SCREEN: ABO/RH(D): AB POS

## 2012-08-08 SURGERY — ARTHROPLASTY, KNEE, TOTAL
Anesthesia: General | Site: Knee | Laterality: Right | Wound class: Clean

## 2012-08-08 MED ORDER — FENTANYL CITRATE 0.05 MG/ML IJ SOLN
INTRAMUSCULAR | Status: DC | PRN
  Administered 2012-08-08: 50 ug via INTRAVENOUS
  Administered 2012-08-08 (×3): 100 ug via INTRAVENOUS

## 2012-08-08 MED ORDER — BUPIVACAINE ON-Q PAIN PUMP (FOR ORDER SET NO CHG)
INJECTION | Status: DC
  Filled 2012-08-08: qty 1

## 2012-08-08 MED ORDER — GLYCOPYRROLATE 0.2 MG/ML IJ SOLN
INTRAMUSCULAR | Status: DC | PRN
  Administered 2012-08-08: .7 mg via INTRAVENOUS

## 2012-08-08 MED ORDER — LACTATED RINGERS IV SOLN
INTRAVENOUS | Status: DC | PRN
  Administered 2012-08-08 (×2): via INTRAVENOUS

## 2012-08-08 MED ORDER — RIVAROXABAN 10 MG PO TABS
10.0000 mg | ORAL_TABLET | Freq: Every day | ORAL | Status: DC
  Administered 2012-08-09 – 2012-08-10 (×2): 10 mg via ORAL
  Filled 2012-08-08 (×3): qty 1

## 2012-08-08 MED ORDER — SODIUM CHLORIDE 0.9 % IV SOLN: INTRAVENOUS | Status: DC

## 2012-08-08 MED ORDER — SODIUM CHLORIDE 0.9 % IJ SOLN
INTRAMUSCULAR | Status: DC | PRN
  Administered 2012-08-08: 50 mL via INTRAVENOUS

## 2012-08-08 MED ORDER — DOCUSATE SODIUM 100 MG PO CAPS
100.0000 mg | ORAL_CAPSULE | Freq: Two times a day (BID) | ORAL | Status: DC
  Administered 2012-08-08 – 2012-08-10 (×4): 100 mg via ORAL

## 2012-08-08 MED ORDER — ONDANSETRON HCL 4 MG/2ML IJ SOLN: 4.0000 mg | Freq: Four times a day (QID) | INTRAMUSCULAR | Status: DC | PRN

## 2012-08-08 MED ORDER — CEFAZOLIN SODIUM-DEXTROSE 2-3 GM-% IV SOLR
2.0000 g | Freq: Four times a day (QID) | INTRAVENOUS | Status: AC
  Administered 2012-08-08 (×2): 2 g via INTRAVENOUS
  Filled 2012-08-08 (×2): qty 50

## 2012-08-08 MED ORDER — DEXAMETHASONE SODIUM PHOSPHATE 10 MG/ML IJ SOLN
INTRAMUSCULAR | Status: DC | PRN
  Administered 2012-08-08: 10 mg via INTRAVENOUS

## 2012-08-08 MED ORDER — ROCURONIUM BROMIDE 100 MG/10ML IV SOLN
INTRAVENOUS | Status: DC | PRN
  Administered 2012-08-08: 30 mg via INTRAVENOUS

## 2012-08-08 MED ORDER — LACTATED RINGERS IV SOLN: INTRAVENOUS | Status: DC

## 2012-08-08 MED ORDER — PHENOL 1.4 % MT LIQD
1.0000 | OROMUCOSAL | Status: DC | PRN
  Filled 2012-08-08: qty 177

## 2012-08-08 MED ORDER — ONDANSETRON HCL 4 MG/2ML IJ SOLN
INTRAMUSCULAR | Status: DC | PRN
  Administered 2012-08-08: 4 mg via INTRAVENOUS

## 2012-08-08 MED ORDER — FENTANYL CITRATE 0.05 MG/ML IJ SOLN
50.0000 ug | INTRAMUSCULAR | Status: DC | PRN
  Administered 2012-08-08 (×2): 50 ug via INTRAVENOUS

## 2012-08-08 MED ORDER — SUCCINYLCHOLINE CHLORIDE 20 MG/ML IJ SOLN
INTRAMUSCULAR | Status: DC | PRN
  Administered 2012-08-08: 100 mg via INTRAVENOUS

## 2012-08-08 MED ORDER — LACTATED RINGERS IV SOLN
INTRAVENOUS | Status: DC
  Administered 2012-08-08: 1000 mL via INTRAVENOUS

## 2012-08-08 MED ORDER — OXYCODONE HCL 5 MG PO TABS
5.0000 mg | ORAL_TABLET | ORAL | Status: DC | PRN
  Administered 2012-08-08 (×3): 10 mg via ORAL
  Filled 2012-08-08 (×3): qty 2

## 2012-08-08 MED ORDER — ACETAMINOPHEN 650 MG RE SUPP: 650.0000 mg | Freq: Four times a day (QID) | RECTAL | Status: DC | PRN

## 2012-08-08 MED ORDER — METHOCARBAMOL 100 MG/ML IJ SOLN
500.0000 mg | Freq: Four times a day (QID) | INTRAVENOUS | Status: DC | PRN
  Administered 2012-08-08: 500 mg via INTRAVENOUS
  Filled 2012-08-08: qty 5

## 2012-08-08 MED ORDER — NEOSTIGMINE METHYLSULFATE 1 MG/ML IJ SOLN
INTRAMUSCULAR | Status: DC | PRN
  Administered 2012-08-08: 4 mg via INTRAVENOUS

## 2012-08-08 MED ORDER — HYDROMORPHONE HCL PF 1 MG/ML IJ SOLN
0.2500 mg | INTRAMUSCULAR | Status: DC | PRN
  Administered 2012-08-08 (×4): 0.5 mg via INTRAVENOUS

## 2012-08-08 MED ORDER — BISACODYL 10 MG RE SUPP: 10.0000 mg | Freq: Every day | RECTAL | Status: DC | PRN

## 2012-08-08 MED ORDER — MENTHOL 3 MG MT LOZG
1.0000 | LOZENGE | OROMUCOSAL | Status: DC | PRN
  Filled 2012-08-08 (×2): qty 9

## 2012-08-08 MED ORDER — DEXAMETHASONE SODIUM PHOSPHATE 10 MG/ML IJ SOLN: 10.0000 mg | Freq: Once | INTRAMUSCULAR | Status: AC

## 2012-08-08 MED ORDER — MIDAZOLAM HCL 5 MG/5ML IJ SOLN
INTRAMUSCULAR | Status: DC | PRN
  Administered 2012-08-08: 2 mg via INTRAVENOUS

## 2012-08-08 MED ORDER — LABETALOL HCL 5 MG/ML IV SOLN
INTRAVENOUS | Status: DC | PRN
  Administered 2012-08-08 (×3): 5 mg via INTRAVENOUS

## 2012-08-08 MED ORDER — MORPHINE SULFATE 2 MG/ML IJ SOLN
1.0000 mg | INTRAMUSCULAR | Status: DC | PRN
  Administered 2012-08-08: 2 mg via INTRAVENOUS
  Administered 2012-08-08: 1 mg via INTRAVENOUS
  Administered 2012-08-09 (×3): 2 mg via INTRAVENOUS
  Filled 2012-08-08 (×5): qty 1

## 2012-08-08 MED ORDER — ACETAMINOPHEN 10 MG/ML IV SOLN
1000.0000 mg | Freq: Once | INTRAVENOUS | Status: AC
  Administered 2012-08-08: 1000 mg via INTRAVENOUS

## 2012-08-08 MED ORDER — FENTANYL CITRATE 0.05 MG/ML IJ SOLN
INTRAMUSCULAR | Status: AC
  Filled 2012-08-08: qty 2

## 2012-08-08 MED ORDER — TRAMADOL HCL 50 MG PO TABS
50.0000 mg | ORAL_TABLET | Freq: Four times a day (QID) | ORAL | Status: DC | PRN
  Administered 2012-08-09 (×2): 50 mg via ORAL
  Filled 2012-08-08 (×2): qty 1

## 2012-08-08 MED ORDER — BUPIVACAINE LIPOSOME 1.3 % IJ SUSP
20.0000 mL | Freq: Once | INTRAMUSCULAR | Status: DC
  Filled 2012-08-08: qty 20

## 2012-08-08 MED ORDER — CEFAZOLIN SODIUM-DEXTROSE 2-3 GM-% IV SOLR
2.0000 g | INTRAVENOUS | Status: AC
  Administered 2012-08-08: 2 g via INTRAVENOUS

## 2012-08-08 MED ORDER — PROPOFOL 10 MG/ML IV BOLUS
INTRAVENOUS | Status: DC | PRN
  Administered 2012-08-08: 180 mg via INTRAVENOUS

## 2012-08-08 MED ORDER — METHOCARBAMOL 500 MG PO TABS
500.0000 mg | ORAL_TABLET | Freq: Four times a day (QID) | ORAL | Status: DC | PRN
  Administered 2012-08-08 – 2012-08-09 (×2): 500 mg via ORAL
  Filled 2012-08-08 (×2): qty 1

## 2012-08-08 MED ORDER — HYDRALAZINE HCL 20 MG/ML IJ SOLN
INTRAMUSCULAR | Status: DC | PRN
  Administered 2012-08-08: 5 mg via INTRAVENOUS

## 2012-08-08 MED ORDER — HYDROMORPHONE HCL PF 1 MG/ML IJ SOLN
INTRAMUSCULAR | Status: DC | PRN
  Administered 2012-08-08 (×2): 0.5 mg via INTRAVENOUS
  Administered 2012-08-08: 1 mg via INTRAVENOUS

## 2012-08-08 MED ORDER — BUPIVACAINE LIPOSOME 1.3 % IJ SUSP
INTRAMUSCULAR | Status: DC | PRN
  Administered 2012-08-08: 20 mL

## 2012-08-08 MED ORDER — DEXTROSE-NACL 5-0.9 % IV SOLN
INTRAVENOUS | Status: DC
  Administered 2012-08-08 – 2012-08-09 (×2): via INTRAVENOUS

## 2012-08-08 MED ORDER — LIDOCAINE HCL (CARDIAC) 20 MG/ML IV SOLN
INTRAVENOUS | Status: DC | PRN
  Administered 2012-08-08: 50 mg via INTRAVENOUS

## 2012-08-08 MED ORDER — DEXAMETHASONE 6 MG PO TABS
10.0000 mg | ORAL_TABLET | Freq: Once | ORAL | Status: AC
  Administered 2012-08-09: 10 mg via ORAL
  Filled 2012-08-08 (×2): qty 1

## 2012-08-08 MED ORDER — CHLORHEXIDINE GLUCONATE 4 % EX LIQD
60.0000 mL | Freq: Once | CUTANEOUS | Status: DC
  Filled 2012-08-08: qty 60

## 2012-08-08 MED ORDER — DIPHENHYDRAMINE HCL 12.5 MG/5ML PO ELIX
12.5000 mg | ORAL_SOLUTION | ORAL | Status: DC | PRN
  Administered 2012-08-08: 25 mg via ORAL
  Filled 2012-08-08: qty 10

## 2012-08-08 MED ORDER — FLEET ENEMA 7-19 GM/118ML RE ENEM: 1.0000 | ENEMA | Freq: Once | RECTAL | Status: AC | PRN

## 2012-08-08 MED ORDER — ACETAMINOPHEN 325 MG PO TABS
650.0000 mg | ORAL_TABLET | Freq: Four times a day (QID) | ORAL | Status: DC | PRN
  Administered 2012-08-09: 650 mg via ORAL
  Filled 2012-08-08: qty 2

## 2012-08-08 MED ORDER — POLYETHYLENE GLYCOL 3350 17 G PO PACK
17.0000 g | PACK | Freq: Every day | ORAL | Status: DC | PRN
  Administered 2012-08-09: 17 g via ORAL

## 2012-08-08 MED ORDER — METOCLOPRAMIDE HCL 10 MG PO TABS: 5.0000 mg | ORAL_TABLET | Freq: Three times a day (TID) | ORAL | Status: DC | PRN

## 2012-08-08 MED ORDER — ACETAMINOPHEN 10 MG/ML IV SOLN
1000.0000 mg | Freq: Four times a day (QID) | INTRAVENOUS | Status: DC
  Administered 2012-08-08 (×2): 1000 mg via INTRAVENOUS
  Filled 2012-08-08 (×4): qty 100

## 2012-08-08 MED ORDER — METOCLOPRAMIDE HCL 5 MG/ML IJ SOLN: 5.0000 mg | Freq: Three times a day (TID) | INTRAMUSCULAR | Status: DC | PRN

## 2012-08-08 MED ORDER — ONDANSETRON HCL 4 MG PO TABS: 4.0000 mg | ORAL_TABLET | Freq: Four times a day (QID) | ORAL | Status: DC | PRN

## 2012-08-08 SURGICAL SUPPLY — 53 items
BAG ZIPLOCK 12X15 (MISCELLANEOUS) ×2 IMPLANT
BANDAGE ELASTIC 6 VELCRO ST LF (GAUZE/BANDAGES/DRESSINGS) ×2 IMPLANT
BANDAGE ESMARK 6X9 LF (GAUZE/BANDAGES/DRESSINGS) ×1 IMPLANT
BLADE SAG 18X100X1.27 (BLADE) ×2 IMPLANT
BLADE SAW SGTL 11.0X1.19X90.0M (BLADE) ×2 IMPLANT
BNDG ESMARK 6X9 LF (GAUZE/BANDAGES/DRESSINGS) ×2
BOWL SMART MIX CTS (DISPOSABLE) ×2 IMPLANT
CATH KIT ON-Q SILVERSOAK 5IN (CATHETERS) ×2 IMPLANT
CEMENT HV SMART SET (Cement) ×4 IMPLANT
CLOTH BEACON ORANGE TIMEOUT ST (SAFETY) ×2 IMPLANT
CUFF TOURN SGL QUICK 34 (TOURNIQUET CUFF) ×1
CUFF TRNQT CYL 34X4X40X1 (TOURNIQUET CUFF) ×1 IMPLANT
DRAPE EXTREMITY T 121X128X90 (DRAPE) ×2 IMPLANT
DRAPE POUCH INSTRU U-SHP 10X18 (DRAPES) ×2 IMPLANT
DRAPE U-SHAPE 47X51 STRL (DRAPES) ×2 IMPLANT
DRSG ADAPTIC 3X8 NADH LF (GAUZE/BANDAGES/DRESSINGS) ×2 IMPLANT
DRSG PAD ABDOMINAL 8X10 ST (GAUZE/BANDAGES/DRESSINGS) ×2 IMPLANT
DURAPREP 26ML APPLICATOR (WOUND CARE) ×2 IMPLANT
ELECT REM PT RETURN 9FT ADLT (ELECTROSURGICAL) ×2
ELECTRODE REM PT RTRN 9FT ADLT (ELECTROSURGICAL) ×1 IMPLANT
EVACUATOR 1/8 PVC DRAIN (DRAIN) ×2 IMPLANT
FACESHIELD LNG OPTICON STERILE (SAFETY) ×10 IMPLANT
GLOVE BIO SURGEON STRL SZ8 (GLOVE) ×2 IMPLANT
GLOVE BIOGEL PI IND STRL 8 (GLOVE) ×2 IMPLANT
GLOVE BIOGEL PI INDICATOR 8 (GLOVE) ×2
GLOVE ECLIPSE 8.0 STRL XLNG CF (GLOVE) ×2 IMPLANT
GLOVE SURG SS PI 6.5 STRL IVOR (GLOVE) ×4 IMPLANT
GOWN STRL NON-REIN LRG LVL3 (GOWN DISPOSABLE) ×4 IMPLANT
GOWN STRL REIN XL XLG (GOWN DISPOSABLE) ×2 IMPLANT
HANDPIECE INTERPULSE COAX TIP (DISPOSABLE) ×1
IMMOBILIZER KNEE 20 (SOFTGOODS) ×2
IMMOBILIZER KNEE 20 THIGH 36 (SOFTGOODS) ×1 IMPLANT
KIT BASIN OR (CUSTOM PROCEDURE TRAY) ×2 IMPLANT
MANIFOLD NEPTUNE II (INSTRUMENTS) ×2 IMPLANT
NS IRRIG 1000ML POUR BTL (IV SOLUTION) ×2 IMPLANT
PACK TOTAL JOINT (CUSTOM PROCEDURE TRAY) ×2 IMPLANT
PAD ABD 7.5X8 STRL (GAUZE/BANDAGES/DRESSINGS) ×2 IMPLANT
PADDING CAST COTTON 6X4 STRL (CAST SUPPLIES) ×6 IMPLANT
PADDING CAST SYN 6 (CAST SUPPLIES) ×1
PADDING CAST SYNTHETIC 6X4 NS (CAST SUPPLIES) ×1 IMPLANT
POSITIONER SURGICAL ARM (MISCELLANEOUS) ×2 IMPLANT
SET HNDPC FAN SPRY TIP SCT (DISPOSABLE) ×1 IMPLANT
SPONGE GAUZE 4X4 12PLY (GAUZE/BANDAGES/DRESSINGS) ×2 IMPLANT
STRIP CLOSURE SKIN 1/2X4 (GAUZE/BANDAGES/DRESSINGS) ×4 IMPLANT
SUCTION FRAZIER 12FR DISP (SUCTIONS) ×2 IMPLANT
SUT MNCRL AB 4-0 PS2 18 (SUTURE) ×2 IMPLANT
SUT VIC AB 2-0 CT1 27 (SUTURE) ×3
SUT VIC AB 2-0 CT1 TAPERPNT 27 (SUTURE) ×3 IMPLANT
SUT VLOC 180 0 24IN GS25 (SUTURE) ×2 IMPLANT
TOWEL OR 17X26 10 PK STRL BLUE (TOWEL DISPOSABLE) ×4 IMPLANT
TRAY FOLEY CATH 14FRSI W/METER (CATHETERS) ×2 IMPLANT
WATER STERILE IRR 1500ML POUR (IV SOLUTION) ×2 IMPLANT
WRAP KNEE MAXI GEL POST OP (GAUZE/BANDAGES/DRESSINGS) ×4 IMPLANT

## 2012-08-08 NOTE — Anesthesia Preprocedure Evaluation (Addendum)
Anesthesia Evaluation  Patient identified by MRN, date of birth, ID band Patient awake    Reviewed: Allergy & Precautions, H&P , NPO status , Patient's Chart, lab work & pertinent test results  Airway Mallampati: II TM Distance: >3 FB Neck ROM: full    Dental No notable dental hx. (+) Teeth Intact and Dental Advisory Given   Pulmonary neg pulmonary ROS,  breath sounds clear to auscultation  Pulmonary exam normal       Cardiovascular Exercise Tolerance: Good negative cardio ROS  Rhythm:regular Rate:Normal     Neuro/Psych negative neurological ROS  negative psych ROS   GI/Hepatic negative GI ROS, Neg liver ROS,   Endo/Other  negative endocrine ROSMorbid obesity  Renal/GU negative Renal ROS  negative genitourinary   Musculoskeletal   Abdominal (+) + obese,   Peds  Hematology negative hematology ROS (+)   Anesthesia Other Findings   Reproductive/Obstetrics negative OB ROS                         Anesthesia Physical Anesthesia Plan  ASA: II  Anesthesia Plan: General   Post-op Pain Management:    Induction: Intravenous  Airway Management Planned: Oral ETT  Additional Equipment:   Intra-op Plan:   Post-operative Plan: Extubation in OR  Informed Consent: I have reviewed the patients History and Physical, chart, labs and discussed the procedure including the risks, benefits and alternatives for the proposed anesthesia with the patient or authorized representative who has indicated his/her understanding and acceptance.   Dental Advisory Given  Plan Discussed with: CRNA and Surgeon  Anesthesia Plan Comments:        Anesthesia Quick Evaluation

## 2012-08-08 NOTE — Op Note (Signed)
Pre-operative diagnosis- Osteoarthritis  Right knee(s)  Post-operative diagnosis- Osteoarthritis Right knee(s)  Procedure-  Right  Total Knee Arthroplasty  Surgeon- Gus Rankin. Kavari Parrillo, MD  Assistant- Dimitri Ped, PA-C   Anesthesia-  General EBL-* No blood loss amount entered *  Drains Hemovac  Tourniquet time-  Total Tourniquet Time Documented: Thigh (Right) - 42 minutes   Complications- None  Condition-PACU - hemodynamically stable.   Brief Clinical Note  [name] is a 67 y.o. year old female with end stage OA of her right knee with progressively worsening pain and dysfunction. She has constant pain, with activity and at rest and significant functional deficits with difficulties even with ADLs. She has had extensive non-op management including analgesics, injections of cortisone and viscosupplements, and home exercise program, but remains in significant pain with significant dysfunction.Radiographs show bone on bone arthritis medial and patellofemoral. She presents now for right Total Knee Arthroplasty.    Procedure in detail---   The patient is brought into the operating room and positioned supine on the operating table. After successful administration of  General,   a tourniquet is placed high on the  Right thigh(s) and the lower extremity is prepped and draped in the usual sterile fashion. Time out is performed by the operating team and then the  Right lower extremity is wrapped in Esmarch, knee flexed and the tourniquet inflated to 300 mmHg.       A midline incision is made with a ten blade through the subcutaneous tissue to the level of the extensor mechanism. A fresh blade is used to make a medial parapatellar arthrotomy. Soft tissue over the proximal medial tibia is subperiosteally elevated to the joint line with a knife and into the semimembranosus bursa with a Cobb elevator. Soft tissue over the proximal lateral tibia is elevated with attention being paid to  avoiding the patellar tendon on the tibial tubercle. The patella is everted, knee flexed 90 degrees and the ACL and PCL are removed. Findings are bone on bone medial and patellofemoral with massive global osteophytes.        The drill is used to create a starting hole in the distal femur and the canal is thoroughly irrigated with sterile saline to remove the fatty contents. The 5 degree Right  valgus alignment guide is placed into the femoral canal and the distal femoral cutting block is pinned to remove 10 mm off the distal femur. Resection is made with an oscillating saw.      The tibia is subluxed forward and the menisci are removed. The extramedullary alignment guide is placed referencing proximally at the medial aspect of the tibial tubercle and distally along the second metatarsal axis and tibial crest. The block is pinned to remove 2mm off the more deficient medial  side. Resection is made with an oscillating saw. Size 3is the most appropriate size for the tibia and the proximal tibia is prepared with the modular drill and keel punch for that size.      The femoral sizing guide is placed and size 4 narrow is most appropriate. Rotation is marked off the epicondylar axis and confirmed by creating a rectangular flexion gap at 90 degrees. The size 4 cutting block is pinned in this rotation and the anterior, posterior and chamfer cuts are made with the oscillating saw. The intercondylar block is then placed and that cut is made.      Trial size 3 tibial component, trial size 4 narrow posterior stabilized femur and a 10  mm posterior stabilized rotating platform insert trial is placed. Full extension is achieved with excellent varus/valgus and anterior/posterior balance throughout full range of motion. The patella is everted and thickness measured to be 22  mm. Free hand resection is taken to 12 mm, a 35 template is placed, lug holes are drilled, trial patella is placed, and it tracks normally. Osteophytes are  removed off the posterior femur with the trial in place. All trials are removed and the cut bone surfaces prepared with pulsatile lavage. Cement is mixed and once ready for implantation, the size 3 tibial implant, size  4 narrow posterior stabilized femoral component, and the size 35 patella are cemented in place and the patella is held with the clamp. The trial insert is placed and the knee held in full extension. The Exparel (20 ml mixed with 50 ml saline) is injected into the extensor mechanism, posterior capsule, medial and lateral gutters and subcutaneous tissues.  All extruded cement is removed and once the cement is hard the permanent 10 mm posterior stabilized rotating platform insert is placed into the tibial tray.      The wound is copiously irrigated with saline solution and the extensor mechanism closed over a hemovac drain with #1 PDS suture. The tourniquet is released for a total tourniquet time of 42  minutes. Flexion against gravity is 140 degrees and the patella tracks normally. Subcutaneous tissue is closed with 2.0 vicryl and subcuticular with running 4.0 Monocryl. The incision is cleaned and dried and steri-strips and a bulky sterile dressing are applied. The limb is placed into a knee immobilizer and the patient is awakened and transported to recovery in stable condition.      Please note that a surgical assistant was a medical necessity for this procedure in order to perform it in a safe and expeditious manner. Surgical assistant was necessary to retract the ligaments and vital neurovascular structures to prevent injury to them and also necessary for proper positioning of the limb to allow for anatomic placement of the prosthesis.   Gus Rankin Ademola Vert, MD    08/08/2012, 12:25 PM

## 2012-08-08 NOTE — Interval H&P Note (Signed)
History and Physical Interval Note:  08/08/2012 10:26 AM  [name]  has presented today for surgery, with the diagnosis of Osteoarthritis OF RIGHT KNEE  The various methods of treatment have been discussed with the patient and family. After consideration of risks, benefits and other options for treatment, the patient has consented to  Procedure(s) (LRB) with comments: TOTAL KNEE ARTHROPLASTY (Right) as a surgical intervention .  The patient's history has been reviewed, patient examined, no change in status, stable for surgery.  I have reviewed the patient's chart and labs.  Questions were answered to the patient's satisfaction.     Loanne Drilling

## 2012-08-08 NOTE — Transfer of Care (Signed)
Immediate Anesthesia Transfer of Care Note  Patient: [name]  Procedure(s) Performed: Procedure(s) (LRB): TOTAL KNEE ARTHROPLASTY (Right)  Patient Location: PACU  Anesthesia Type: General  Level of Consciousness: sedated, patient cooperative and responds to stimulaton  Airway & Oxygen Therapy: Patient Spontanous Breathing and Patient connected to face mask oxgen  Post-op Assessment: Report given to PACU RN and Post -op Vital signs reviewed and stable  Post vital signs: Reviewed and stable  Complications: No apparent anesthesia complications

## 2012-08-08 NOTE — H&P (View-Only) (Signed)
[name]  DOB: 19-Dec-1945 Married / Language: English / Race: White Female  Date of Admission:  08/08/2012  Chief Complaint:  Right Knee Pain  History of Present Illness The patient is a 67 year old female who comes in for a preoperative History and Physical. The patient is scheduled for a right total knee arthroplasty to be performed by Dr. Gus Rankin. Aluisio, MD at [facility] on 08/08/2012. The patient is a 67 year old female presenting for a post-operative visit. The patient comes in postop from left total knee arthroplasty. The patient states that she is getting better at this time. The pain is under fair control at this time and describe their pain as mild to moderate. They are currently on Hydrocodone (10/325) for their pain. The patient is currently taking 5 pill(s) per day. The patient is currently doing home exercise program. The patient feels that they are progressing well at this time. She states the right knee is what is giving her more trouble than the left now. She still has some burning laterally on the left but also has a lot of back problems and gets burning all the way down to her foot. Her right knee is painful all the time. She is ready now to get the right knee fixed. They have been treated conservatively in the past for the above stated problem and despite conservative measures, they continue to have progressive pain and severe functional limitations and dysfunction. They have failed non-operative management including home exercise, medications. It is felt that they would benefit from undergoing total joint replacement. Risks and benefits of the procedure have been discussed with the patient and they elect to proceed with surgery. There are no active contraindications to surgery such as ongoing infection or rapidly progressive neurological disease.   Problem List S/P Left total knee arthroplasty (V43.65) Primary osteoarthritis of one knee  (715.16)   Allergies Ultram *ANALGESICS - OPIOID*. sick   Family History Diabetes Mellitus. mother Heart Disease. mother Hypertension. mother Osteoarthritis. mother and grandmother mothers side Cancer. mother and father   Social History Current work status. retired Alcohol use. never consumed alcohol No alcohol use Children. 2 Illicit drug use. no Tobacco use. Never smoker. never smoker Exercise. Exercises never Number of flights of stairs before winded. less than 1 Living situation. live with spouse Previously in rehab. no Pain Contract. no Tobacco / smoke exposure. no Marital status. married Drug/Alcohol Rehab (Currently). no Post-Surgical Plans. Plan is to go home. Advance Directives. Living Will   Medication History Hydrocodone-Acetaminophen ( Oral) Specific dose unknown - Active.   Past Surgical History Hysterectomy. partial (non-cancerous) Arthroscopy of Knee. bilateral Straighten Nasal Septum Neck Disc Surgery Gallbladder Surgery. open Spinal Decompression. neck and lower back Rotator Cuff Repair. bilateral Spinal Surgery Total Knee Replacement - Left. Date: 04/13/2012.   Medical History Osteoarthritis Kidney Stone. Currently has a large left renal calculus. Chronic Pain Tinnitus Measles Obesity Degenerative Disc Disease Lumbago (724.2). 03/10/2010 Transfusion history. October 2013 with previous total knee   Review of Systems General:Present- Fatigue. Not Present- Chills, Fever, Night Sweats, Appetite Loss, Feeling sick, Weight Gain and Weight Loss. Skin:Not Present- Itching, Rash, Skin Color Changes, Ulcer, Psoriasis and Change in Hair or Nails. HEENT:Not Present- Sensitivity to light, Nose Bleed, Visual Loss, Decreased Hearing and Ringing in the Ears. Neck:Not Present- Swollen Glands and Neck Mass. Respiratory:Not Present- Shortness of breath, Snoring, Chronic Cough and Bloody  sputum. Cardiovascular:Not Present- Shortness of Breath, Chest Pain, Swelling of Extremities, Leg Cramps  and Palpitations. Gastrointestinal:Not Present- Bloody Stool, Heartburn, Abdominal Pain, Vomiting, Nausea and Incontinence of Stool. Female Genitourinary:Not Present- Blood in Urine, Irregular/missing periods, Frequency, Incontinence and Nocturia. Musculoskeletal:Present- Muscle Weakness, Muscle Pain, Joint Stiffness, Joint Pain and Back Pain. Not Present- Joint Swelling. Neurological:Not Present- Tingling, Numbness, Burning, Tremor, Headaches and Dizziness. Psychiatric:Not Present- Anxiety, Depression and Memory Loss. Endocrine:Not Present- Cold Intolerance, Heat Intolerance, Excessive hunger and Excessive Thirst. Hematology:Not Present- Abnormal Bleeding, Abnormal bruising, Anemia and Blood Clots.   Vitals Weight: 221.7 lb Height: 61 in Weight was reported by patient. Height was reported by patient. Body Surface Area: 2.08 m Body Mass Index: 41.89 kg/m Pulse: 84 (Regular) Resp.: 16 (Unlabored) BP: 148/92 (Sitting, Left Arm, Standard)    Physical Exam The physical exam findings are as follows:   General Mental Status - Alert, cooperative and good historian. General Appearance- pleasant. Not in acute distress. Orientation- Oriented X3. Build & Nutrition- Well nourished and Well developed.   Head and Neck Head- normocephalic, atraumatic . Neck Global Assessment- supple. no bruit auscultated on the right and no bruit auscultated on the left.   Eye Pupil- Bilateral- Regular and Round. Motion- Bilateral- EOMI.   Chest and Lung Exam Auscultation: Breath sounds:- clear at anterior chest wall and - clear at posterior chest wall. Adventitious sounds:- No Adventitious sounds.   Cardiovascular Auscultation:Rhythm- Regular rate and rhythm. Heart Sounds- S1 WNL and S2 WNL. Murmurs & Other Heart Sounds:Auscultation of the heart  reveals - No Murmurs.   Abdomen Inspection:Contour- Generalized moderate distention. Palpation/Percussion:Tenderness- Abdomen is non-tender to palpation. Rigidity (guarding)- Abdomen is soft. Auscultation:Auscultation of the abdomen reveals - Bowel sounds normal.   Female Genitourinary Not done, not pertinent to present illness  Musculoskeletal On exam she is alert and oriented in no apparent distress. Her left knee looks fine. Range of motion is 5 to 120 degrees. There is no swelling, tenderness or instability. Right knee range about 10 to 120. Marked crepitus on range of motion. Tender medial greater than lateral. No instability.  RADIOGRAPHS: AP and lateral left knee show her prosthesis to be in excellent position with no periprosthetic abnormalities. AP and lateral shows Right knee bone on bone medial and patellofemoral.  Assessment & Plan Primary osteoarthritis of one knee (715.16) Impression: Right Knee  Note: Plan is for a Right Total Knee Replacement by Dr. Lequita Halt.  Plan is to go home.  Urologist - Dr. Wylene Simmer Cardiolofy - Dr. Donnie Aho  Signed electronically by Roberts Gaudy, PA-C

## 2012-08-08 NOTE — Progress Notes (Signed)
UR COMPLETED  

## 2012-08-08 NOTE — Anesthesia Postprocedure Evaluation (Signed)
  Anesthesia Post-op Note  Patient: [name]  Procedure(s) Performed: Procedure(s) (LRB): TOTAL KNEE ARTHROPLASTY (Right)  Patient Location: PACU  Anesthesia Type: General  Level of Consciousness: awake and alert   Airway and Oxygen Therapy: Patient Spontanous Breathing  Post-op Pain: mild  Post-op Assessment: Post-op Vital signs reviewed, Patient's Cardiovascular Status Stable, Respiratory Function Stable, Patent Airway and No signs of Nausea or vomiting  Last Vitals:  Filed Vitals:   08/08/12 1330  BP: 177/94  Pulse: 83  Temp:   Resp: 12    Post-op Vital Signs: stable   Complications: No apparent anesthesia complications

## 2012-08-09 ENCOUNTER — Encounter (HOSPITAL_COMMUNITY): Payer: Self-pay | Admitting: Orthopedic Surgery

## 2012-08-09 LAB — BASIC METABOLIC PANEL
CO2: 24 mEq/L (ref 19–32)
Calcium: 8.5 mg/dL (ref 8.4–10.5)
Chloride: 102 mEq/L (ref 96–112)
Potassium: 3.8 mEq/L (ref 3.5–5.1)
Sodium: 135 mEq/L (ref 135–145)

## 2012-08-09 LAB — CBC
Hemoglobin: 10.2 g/dL — ABNORMAL LOW (ref 12.0–15.0)
Platelets: 274 10*3/uL (ref 150–400)
RBC: 3.51 MIL/uL — ABNORMAL LOW (ref 3.87–5.11)
WBC: 13.6 10*3/uL — ABNORMAL HIGH (ref 4.0–10.5)

## 2012-08-09 MED ORDER — HYDROCODONE-ACETAMINOPHEN 10-325 MG PO TABS: 1.0000 | ORAL_TABLET | ORAL | Status: DC | PRN

## 2012-08-09 MED ORDER — METHOCARBAMOL 500 MG PO TABS: 500.0000 mg | ORAL_TABLET | Freq: Four times a day (QID) | ORAL | Status: DC | PRN

## 2012-08-09 MED ORDER — RIVAROXABAN 10 MG PO TABS: 10.0000 mg | ORAL_TABLET | Freq: Every day | ORAL | Status: DC

## 2012-08-09 MED ORDER — HYDROCODONE-ACETAMINOPHEN 10-325 MG PO TABS
1.0000 | ORAL_TABLET | ORAL | Status: DC | PRN
  Administered 2012-08-09 (×2): 2 via ORAL
  Administered 2012-08-09: 1 via ORAL
  Administered 2012-08-09: 2 via ORAL
  Administered 2012-08-10 (×2): 1 via ORAL
  Filled 2012-08-09: qty 1
  Filled 2012-08-09 (×4): qty 2
  Filled 2012-08-09: qty 1

## 2012-08-09 NOTE — Evaluation (Signed)
Physical Therapy Evaluation Patient Details Name: [name] MRN: [id number] DOB: [birth year] Today's Date: 08/09/2012 Time: 1125-1140 PT Time Calculation (min): 15 min  PT Assessment / Plan / Recommendation Clinical Impression  67 yo female s/p R TKA. Mobilizing fairly well. Anticipate pt will progress well during stay. Recommend HHPT for continued rehab to improve, strength, ROM, gait and independence with functional mobility. Pt will need RW.     PT Assessment  Patient needs continued PT services    Follow Up Recommendations  Home health PT;Supervision - Intermittent    Does the patient have the potential to tolerate intense rehabilitation      Barriers to Discharge        Equipment Recommendations  Rolling walker with 5" wheels    Recommendations for Other Services  (pt declined OT)   Frequency 7X/week    Precautions / Restrictions Precautions Precautions: Knee Required Braces or Orthoses: Knee Immobilizer - Right Knee Immobilizer - Right: Discontinue once straight leg raise with < 10 degree lag Restrictions Weight Bearing Restrictions: No RLE Weight Bearing: Weight bearing as tolerated   Pertinent Vitals/Pain 7/10 R knee      Mobility  Bed Mobility Details for Bed Mobility Assistance: pt sitting in window seat. stated she did not use walker to get over there! Transfers Transfers: Sit to Stand;Stand to Sit Sit to Stand: 4: Min assist;From chair/3-in-1;With upper extremity assist Stand to Sit: 4: Min assist;To chair/3-in-1;With armrests Details for Transfer Assistance: VCs safety, technique, hand placement. Assist to rise, stabilize control descent Ambulation/Gait Ambulation/Gait Assistance: 4: Min guard Ambulation Distance (Feet): 50 Feet Assistive device: Rolling walker Ambulation/Gait Assistance Details: VCS safety, sequence, posture, distance from RW.  Gait Pattern: Step-to pattern;Step-through pattern;Antalgic;Trunk flexed;Decreased stance time -  right;Decreased stride length    Shoulder Instructions     Exercises     PT Diagnosis: Difficulty walking;Abnormality of gait;Acute pain  PT Problem List: Decreased strength;Decreased range of motion;Decreased mobility;Decreased knowledge of use of DME;Pain;Decreased safety awareness;Decreased knowledge of precautions PT Treatment Interventions: DME instruction;Gait training;Stair training;Functional mobility training;Therapeutic activities;Therapeutic exercise;Patient/family education   PT Goals Acute Rehab PT Goals PT Goal Formulation: With patient Time For Goal Achievement: 08/23/12 Potential to Achieve Goals: Good Pt will go Supine/Side to Sit: with supervision PT Goal: Supine/Side to Sit - Progress: Goal set today Pt will go Sit to Supine/Side: with supervision PT Goal: Sit to Supine/Side - Progress: Goal set today Pt will go Sit to Stand: with supervision PT Goal: Sit to Stand - Progress: Goal set today Pt will Ambulate: 51 - 150 feet;with supervision;with rolling walker PT Goal: Ambulate - Progress: Goal set today Pt will Go Up / Down Stairs: 1-2 stairs;with min assist;with rolling walker PT Goal: Up/Down Stairs - Progress: Goal set today Pt will Perform Home Exercise Program: with supervision, verbal cues required/provided PT Goal: Perform Home Exercise Program - Progress: Goal set today  Visit Information  Last PT Received On: 08/09/12 Assistance Needed: +1    Subjective Data  Subjective: "Can you believe this?" Patient Stated Goal: home   Prior Functioning  Home Living Lives With: Spouse Available Help at Discharge: Family Type of Home: House Home Access: Stairs to enter Secretary/administrator of Steps: 1+1 Home Layout: One level Bathroom Shower/Tub: Health visitor: Standard Home Adaptive Equipment: Bedside commode/3-in-1;Straight cane;Reacher Prior Function Level of Independence: Independent Able to Take Stairs?:  Yes Communication Communication: No difficulties    Cognition  Overall Cognitive Status: Appears within functional limits for tasks assessed/performed Arousal/Alertness: Awake/alert  Orientation Level: Appears intact for tasks assessed Behavior During Session: [facility] for tasks performed    Extremity/Trunk Assessment Right Lower Extremity Assessment RLE ROM/Strength/Tone: Deficits RLE ROM/Strength/Tone Deficits: hip flex 3/5, fair quad set, knee ext 3/5 RLE Sensation: WFL - Light Touch Left Lower Extremity Assessment LLE ROM/Strength/Tone: WFL for tasks assessed   Balance    End of Session PT - End of Session Activity Tolerance: Patient tolerated treatment well Patient left: in chair;with call bell/phone within reach;with family/visitor present  GP     Rebeca Alert [facility] 08/09/2012, 3:33 PM [phone]

## 2012-08-09 NOTE — Care Management Note (Addendum)
    Page 1 of 2   08/10/2012     12:44:22 PM   CARE MANAGEMENT NOTE 08/10/2012  Patient:  [name], [name]   Account Number:  [id number]  Date Initiated:  08/09/2012  Documentation initiated by:  Colleen Can  Subjective/Objective Assessment:   dx total knee reploacemnt-right     Action/Plan:   CM spoke with patient. Plans are for patient to return to her home in [place] where spouse will be caregiver. States she has commode seat but will need RW. She is requesting [facility] for [facility] services   Anticipated DC Date:  08/09/2012   Anticipated DC Plan:  HOME W HOME HEALTH SERVICES  In-house referral  NA      DC Planning Services  CM consult  Other      [facility] Choice  HOME HEALTH  DURABLE MEDICAL EQUIPMENT   Choice offered to / List presented to:  C-1 Patient   DME arranged  Levan Hurst      DME agency  [facility]     HH arranged  HH-2 PT      [facility] agency  [facility]   Status of service:  Completed, signed off Medicare Important Message given?  NA - LOS <3 / Initial given by admissions (If response is "NO", the following Medicare IM given date fields will be blank) Date Medicare IM given:   Date Additional Medicare IM given:    Discharge Disposition:  HOME W HOME HEALTH SERVICES  Per UR Regulation:    If discussed at Long Length of Stay Meetings, dates discussed:    Comments:  08/09/2012 Colleen Can BSN RN CCM [phone] [facility] can provide HHpt for patient upon discharge. Faxed face sheet, HH orders, op note, H&P, face to face to [phone] with confirmation. RW has been delivered to patient's room. [facility] will start [facility] services within 48hrs of discharge. Pt was given discount card for xarelto at request.

## 2012-08-09 NOTE — Progress Notes (Signed)
OT Cancellation Note  Patient Details Name: [name] MRN: [id number] DOB: [birth year]   Cancelled Treatment:    Reason Eval/Treat Not Completed: Other (comment) (screen)  Kelani Robart 08/09/2012, 10:48 Nestor Lewandowsky, OTR/L [phone] 08/09/2012

## 2012-08-09 NOTE — Clinical Documentation Improvement (Signed)
BMI DOCUMENTATION CLARIFICATION QUERY  THIS DOCUMENT IS NOT A PERMANENT PART OF THE MEDICAL RECORD  TO RESPOND TO THE THIS QUERY, FOLLOW THE INSTRUCTIONS BELOW:  1. If needed, update documentation for the patient's encounter via the notes activity.  2. Access this query again and click edit on the In [employer].  3. After updating, or not, click F2 to complete all highlighted (required) fields concerning your review. Select "additional documentation in the medical record" OR "no additional documentation provided".  4. Click Sign note button.  5. The deficiency will fall out of your In Basket *Please let us know if you are not able to complete this workflow by phone or e-mail (listed below).         08/09/12  Dear Patrica Duel, PA/Associates  In an effort to better capture your patient's severity of illness, reflect appropriate length of stay and utilization of resources, a review of the patient medical record has revealed the following indicators.    Based on your clinical judgment, please clarify and document in a progress note and/or discharge summary the clinical condition associated with the following supporting information:  In responding to this query please exercise your independent judgment.  The fact that a query is asked, does not imply that any particular answer is desired or expected.  Clarification Needed  Pt's BMI=   41.89                       Please clarify whether or not BMI can be linked to one of the diagnoses listed below and document in pn  and d/c. Thank You!  BEST PRACTICE: When linking BMI to a diagnosis please document both BMI and diagnosis together in pn for accuracy of severity of illness (SOI) and risk of mortality (ROM).      ossible Clinical conditions  Morbid Obesity W/ BMI=   Underweight w/BMI=  Other condition___________________  Cannot Clinically determine _____________  Risk Factors: Obesity, OA, H/O DDD, R TKA.  Sign &  Symptoms: Weight: 221.7 lb  Height: 61 in Weight was reported by patient. Height was reported by patient.  Body Surface Area: 2.08 m  Body Mass Index: 41.89 kg/m   Diagnostics: Lab:  Treatment Regular Diet   Reviewed: additional documentation in the medical record ljh  Thank You,  Enis Slipper  RN, BSN, MSN/Inf, CCDS Clinical Documentation Specialist [facility] Dept Pager: [phone] / E-mail: [email or web address]  Health Information Management [place]

## 2012-08-09 NOTE — Progress Notes (Signed)
Physical Therapy Treatment Patient Details Name: [name] MRN: [id number] DOB: 10-08-1945 Today's Date: 08/09/2012 Time: 4098-1191 PT Time Calculation (min): 34 min  PT Assessment / Plan / Recommendation Comments on Treatment Session  Progressing well with mobility. Will plan to practice steps on tomorrow. Recommend HHPT.     Follow Up Recommendations  Home health PT;Supervision - Intermittent     Does the patient have the potential to tolerate intense rehabilitation     Barriers to Discharge        Equipment Recommendations  Rolling walker with 5" wheels    Recommendations for Other Services  (pt declined OT)  Frequency 7X/week   Plan Discharge plan remains appropriate    Precautions / Restrictions Precautions Precautions: Knee Required Braces or Orthoses: Knee Immobilizer - Right (Dc'd KI 08/09/12-able to SLR) Knee Immobilizer - Right: Discontinue once straight leg raise with < 10 degree lag Restrictions Weight Bearing Restrictions: No RLE Weight Bearing: Weight bearing as tolerated   Pertinent Vitals/Pain 6/10 R knee    Mobility  Bed Mobility Bed Mobility: Supine to Sit;Sit to Supine Supine to Sit: 4: Min guard Sit to Supine: 4: Min assist Details for Bed Mobility Assistance: Pt able to get R LE off bed. Very minimal assist for LE back onto bed.  Transfers Transfers: Sit to Stand;Stand to Sit Sit to Stand: 4: Min guard;From chair/3-in-1;From toilet Stand to Sit: 4: Min guard;To bed;To toilet Details for Transfer Assistance: VCs safety, technique, hand placement Ambulation/Gait Ambulation/Gait Assistance: 4: Min guard Ambulation Distance (Feet): 90 Feet Assistive device: Rolling walker Ambulation/Gait Assistance Details: VCS safety, sequence, posture, distance from RW. Decreased awareness for safety at times during ambulation Gait Pattern: Step-through pattern;Trunk flexed;Decreased stance time - right;Decreased stride length    Exercises Total Joint  Exercises Ankle Circles/Pumps: AROM;Both;10 reps;Supine Quad Sets: AROM;Both;10 reps;Supine Short Arc Quad: AROM;Right;10 reps;Supine Heel Slides: AAROM;Right;10 reps;Supine Hip ABduction/ADduction: AROM;Right;10 reps;Supine Straight Leg Raises: AROM;Right;10 reps;Supine   PT Diagnosis: Difficulty walking;Abnormality of gait;Acute pain  PT Problem List: Decreased strength;Decreased range of motion;Decreased mobility;Decreased knowledge of use of DME;Pain;Decreased safety awareness;Decreased knowledge of precautions PT Treatment Interventions: DME instruction;Gait training;Stair training;Functional mobility training;Therapeutic activities;Therapeutic exercise;Patient/family education   PT Goals Acute Rehab PT Goals PT Goal Formulation: With patient Time For Goal Achievement: 08/23/12 Potential to Achieve Goals: Good Pt will go Supine/Side to Sit: with supervision PT Goal: Supine/Side to Sit - Progress: Progressing toward goal Pt will go Sit to Supine/Side: with supervision PT Goal: Sit to Supine/Side - Progress: Progressing toward goal Pt will go Sit to Stand: with supervision PT Goal: Sit to Stand - Progress: Progressing toward goal Pt will Ambulate: 51 - 150 feet;with supervision;with rolling walker PT Goal: Ambulate - Progress: Progressing toward goal Pt will Go Up / Down Stairs: 1-2 stairs;with min assist;with rolling walker PT Goal: Up/Down Stairs - Progress: Goal set today Pt will Perform Home Exercise Program: with supervision, verbal cues required/provided PT Goal: Perform Home Exercise Program - Progress: Progressing toward goal  Visit Information  Last PT Received On: 08/09/12 Assistance Needed: +1    Subjective Data  Subjective: "I still can't believe how I'm doing!" Patient Stated Goal: home   Cognition  Overall Cognitive Status: Appears within functional limits for tasks assessed/performed Arousal/Alertness: Awake/alert Orientation Level: Appears intact for tasks  assessed Behavior During Session: [facility] for tasks performed    Balance     End of Session PT - End of Session Equipment Utilized During Treatment: Gait belt Activity Tolerance: Patient tolerated treatment  well Patient left: in bed;with call bell/phone within reach   GP     Rebeca Alert [facility] 08/09/2012, 3:38 PM [phone]

## 2012-08-09 NOTE — Progress Notes (Signed)
   Subjective: 1 Day Post-Op Procedure(s) (LRB): TOTAL KNEE ARTHROPLASTY (Right) Patient reports pain as mild and moderate.   Patient seen in rounds with Dr. Lequita Halt. Patient is well, but has had some minor complaints of pain in the knee, requiring pain medications We will start therapy today.  Plan is to go Home after hospital stay.  Objective: Vital signs in last 24 hours: Temp:  [97.6 F (36.4 C)-98.8 F (37.1 C)] 98.6 F (37 C) (01/28 0531) Pulse Rate:  [66-98] 73  (01/28 0531) Resp:  [12-21] 16  (01/28 0531) BP: (103-181)/(63-94) 130/64 mmHg (01/28 0531) SpO2:  [96 %-100 %] 98 % (01/28 0531) Weight:  [101.152 kg (223 lb)] 101.152 kg (223 lb) (01/27 1411)  Intake/Output from previous day:  Intake/Output Summary (Last 24 hours) at 08/09/12 0730 Last data filed at 08/09/12 0531  Gross per 24 hour  Intake 4307.5 ml  Output   2085 ml  Net 2222.5 ml    Intake/Output this shift:    Labs:  Basename 08/09/12 0413  HGB 10.2*    Basename 08/09/12 0413  WBC 13.6*  RBC 3.51*  HCT 30.5*  PLT 274    Basename 08/09/12 0413  NA 135  K 3.8  CL 102  CO2 24  BUN 14  CREATININE 0.59  GLUCOSE 233*  CALCIUM 8.5   No results found for this basename: LABPT:2,INR:2 in the last 72 hours  EXAM General - Patient is Alert, Appropriate and Oriented Extremity - Neurovascular intact Sensation intact distally Dorsiflexion/Plantar flexion intact Dressing - dressing C/D/I Motor Function - intact, moving foot and toes well on exam.  Hemovac pulled without difficulty.  Past Medical History  Diagnosis Date  . Arthritis     both knees  . History of transfusion     POST AL TOTAL KNEE  . History of kidney stones   . Carpal tunnel syndrome, bilateral     both hands go to sleep at times from elbows down    Assessment/Plan: 1 Day Post-Op Procedure(s) (LRB): TOTAL KNEE ARTHROPLASTY (Right) Active Problems:  * No active hospital problems. *   Estimated Body mass index is  42.14 kg/(m^2) as calculated from the following:   Height as of this encounter: 5\' 1" (1.549 m).   Weight as of this encounter: 223 lb(101.152 kg). Advance diet Up with therapy Discharge home with home health when improved  DVT Prophylaxis - Xarelto Weight-Bearing as tolerated to right leg No vaccines. D/C O2 and Pulse OX and try on Room Air  PERKINS, ALEXZANDREW 08/09/2012, 7:30 AM

## 2012-08-10 LAB — CBC
MCHC: 33.7 g/dL (ref 30.0–36.0)
Platelets: 262 10*3/uL (ref 150–400)
RDW: 13.5 % (ref 11.5–15.5)
WBC: 17.5 10*3/uL — ABNORMAL HIGH (ref 4.0–10.5)

## 2012-08-10 LAB — BASIC METABOLIC PANEL
BUN: 17 mg/dL (ref 6–23)
Chloride: 102 mEq/L (ref 96–112)
GFR calc Af Amer: >90 mL/min (ref >90)
GFR calc non Af Amer: >90 mL/min (ref >90)
Potassium: 3.9 mEq/L (ref 3.5–5.1)
Sodium: 137 mEq/L (ref 135–145)

## 2012-08-10 MED ORDER — POLYSACCHARIDE IRON COMPLEX 150 MG PO CAPS: 150.0000 mg | ORAL_CAPSULE | Freq: Every day | ORAL | Status: DC

## 2012-08-10 MED ORDER — POLYSACCHARIDE IRON COMPLEX 150 MG PO CAPS
150.0000 mg | ORAL_CAPSULE | Freq: Every day | ORAL | Status: DC
  Administered 2012-08-10: 150 mg via ORAL
  Filled 2012-08-10: qty 1

## 2012-08-10 MED ORDER — METHOCARBAMOL 500 MG PO TABS: 500.0000 mg | ORAL_TABLET | Freq: Four times a day (QID) | ORAL | Status: DC | PRN

## 2012-08-10 MED ORDER — OXYCODONE HCL 5 MG PO TABS
10.0000 mg | ORAL_TABLET | ORAL | Status: DC | PRN
  Administered 2012-08-10 (×2): 10 mg via ORAL
  Filled 2012-08-10 (×2): qty 2

## 2012-08-10 MED ORDER — OXYCODONE HCL 10 MG PO TABS: 10.0000 mg | ORAL_TABLET | ORAL | Status: DC | PRN

## 2012-08-10 MED ORDER — RIVAROXABAN 10 MG PO TABS: 10.0000 mg | ORAL_TABLET | Freq: Every day | ORAL | Status: DC

## 2012-08-10 NOTE — Progress Notes (Signed)
Physical Therapy Treatment Patient Details Name: [name] MRN: [id number] DOB: 01-Jun-1946 Today's Date: 08/10/2012 Time: 4034-7425 PT Time Calculation (min): 29 min  PT Assessment / Plan / Recommendation Comments on Treatment Session  continuing to progress well. plan is for d/c home later today. will see for a 2nd session to practice negotiating steps. Recommend HHPT.     Follow Up Recommendations  Home health PT;Supervision - Intermittent     Does the patient have the potential to tolerate intense rehabilitation     Barriers to Discharge        Equipment Recommendations  Rolling walker with 5" wheels    Recommendations for Other Services    Frequency 7X/week   Plan Discharge plan remains appropriate    Precautions / Restrictions Precautions Precautions: Knee Restrictions Weight Bearing Restrictions: No RLE Weight Bearing: Weight bearing as tolerated   Pertinent Vitals/Pain 8/10 R knee    Mobility  Bed Mobility Bed Mobility: Supine to Sit Supine to Sit: 5: Supervision;HOB elevated Transfers Transfers: Sit to Stand;Stand to Sit Sit to Stand: 5: Supervision;From bed Stand to Sit: 5: Supervision;To chair/3-in-1 Details for Transfer Assistance: VCs safety, technique, hand placement  Ambulation/Gait Ambulation/Gait Assistance: 5: Supervision Ambulation Distance (Feet): 130 Feet Assistive device: Rolling walker Gait Pattern: Step-through pattern;Antalgic;Decreased stride length    Exercises Total Joint Exercises Ankle Circles/Pumps: AROM;Both;15 reps;Supine Quad Sets: AROM;Right;10 reps;Supine Short Arc Quad: AROM;Right;10 reps;Supine Heel Slides: AAROM;Right;10 reps;Supine Hip ABduction/ADduction: AROM;Right;10 reps;Supine Straight Leg Raises: AROM;Right;10 reps;Supine   PT Diagnosis:    PT Problem List:   PT Treatment Interventions:     PT Goals Acute Rehab PT Goals Pt will go Supine/Side to Sit: with supervision PT Goal: Supine/Side to Sit -  Progress: Met Pt will go Sit to Stand: with supervision PT Goal: Sit to Stand - Progress: Met Pt will Ambulate: 51 - 150 feet;with supervision;with rolling walker PT Goal: Ambulate - Progress: Met Pt will Perform Home Exercise Program: with supervision, verbal cues required/provided PT Goal: Perform Home Exercise Program - Progress: Progressing toward goal  Visit Information  Last PT Received On: 08/10/12 Assistance Needed: +1    Subjective Data  Subjective: "Its hurting more today" Patient Stated Goal: home   Cognition  Overall Cognitive Status: Appears within functional limits for tasks assessed/performed Arousal/Alertness: Awake/alert Orientation Level: Appears intact for tasks assessed Behavior During Session: [facility] for tasks performed    Balance     End of Session PT - End of Session Equipment Utilized During Treatment: Gait belt Activity Tolerance: Patient tolerated treatment well Patient left: in chair;with call bell/phone within reach CPM Right Knee CPM Right Knee: Off   GP     Rebeca Alert [facility] 08/10/2012, 11:09 AM [phone]

## 2012-08-10 NOTE — Progress Notes (Signed)
   Subjective: 2 Days Post-Op Procedure(s) (LRB): TOTAL KNEE ARTHROPLASTY (Right) Patient reports pain as mild.   Patient seen in rounds with Dr. Lequita Halt. Will increase the Oxy to 10 mg. Patient is well, and has had no acute complaints or problems Patient is ready to go home later after two sessions.  Objective: Vital signs in last 24 hours: Temp:  [97.4 F (36.3 C)-98.7 F (37.1 C)] 98.3 F (36.8 C) (01/29 0610) Pulse Rate:  [69-98] 69  (01/29 0610) Resp:  [16-18] 16  (01/29 0610) BP: (122-153)/(66-74) 138/66 mmHg (01/29 0610) SpO2:  [94 %-97 %] 96 % (01/29 0610)  Intake/Output from previous day:  Intake/Output Summary (Last 24 hours) at 08/10/12 0656 Last data filed at 08/10/12 1914  Gross per 24 hour  Intake   1080 ml  Output   2160 ml  Net  -1080 ml    Intake/Output this shift: Total I/O In: -  Out: 1200 [Urine:1200]  Labs:  [facility] 08/10/12 0415 08/09/12 0413  HGB 9.0* 10.2*    Basename 08/10/12 0415 08/09/12 0413  WBC 17.5* 13.6*  RBC 3.07* 3.51*  HCT 26.7* 30.5*  PLT 262 274    Basename 08/10/12 0415 08/09/12 0413  NA 137 135  K 3.9 3.8  CL 102 102  CO2 26 24  BUN 17 14  CREATININE 0.62 0.59  GLUCOSE 149* 233*  CALCIUM 8.7 8.5   No results found for this basename: LABPT:2,INR:2 in the last 72 hours  EXAM: General - Patient is Alert, Appropriate and Oriented Extremity - Neurovascular intact Sensation intact distally Dorsiflexion/Plantar flexion intact No cellulitis present Incision - clean, dry, no drainage, healing Motor Function - intact, moving foot and toes well on exam.   Assessment/Plan: 2 Days Post-Op Procedure(s) (LRB): TOTAL KNEE ARTHROPLASTY (Right) Procedure(s) (LRB): TOTAL KNEE ARTHROPLASTY (Right) Past Medical History  Diagnosis Date  . Arthritis     both knees  . History of transfusion     POST AL TOTAL KNEE  . History of kidney stones   . Carpal tunnel syndrome, bilateral     both hands go to sleep at times from  elbows down   Active Problems:  Postop Acute blood loss anemia  Estimated Body mass index is 42.14 kg/(m^2) as calculated from the following:   Height as of this encounter: 5\' 1" (1.549 m).   Weight as of this encounter: 223 lb(101.152 kg). Discharge home with home health Diet - Regular diet Follow up - in 2 weeks Activity - WBAT Disposition - Home Condition Upon Discharge - Good D/C Meds - See DC Summary DVT Prophylaxis - Xarelto  PERKINS, ALEXZANDREW 08/10/2012, 6:56 AM

## 2012-08-10 NOTE — Discharge Summary (Signed)
Physician Discharge Summary   Patient ID: [name] MRN: [id number] DOB/AGE: [birth year] 67 y.o.  Admit date: 08/08/2012 Discharge date: 08/10/2012  Primary Diagnosis:  Osteoarthritis Right knee  Admission Diagnoses:  Past Medical History  Diagnosis Date  . Arthritis     both knees  . History of transfusion     POST AL TOTAL KNEE  . History of kidney stones   . Carpal tunnel syndrome, bilateral     both hands go to sleep at times from elbows down   Discharge Diagnoses:   Active Problems:  Postop Acute blood loss anemia  Estimated Body mass index is 42.14 kg/(m^2) as calculated from the following:   Height as of this encounter: 5\' 1" (1.549 m).   Weight as of this encounter: 223 lb(101.152 kg).  Classification of overweight in adults according to BMI (WHO, 1998)   Procedure:  Procedure(s) (LRB): TOTAL KNEE ARTHROPLASTY (Right)   Consults: None  HPI: [name] is a 67 y.o. year old female with end stage OA of her right knee with progressively worsening pain and dysfunction. She has constant pain, with activity and at rest and significant functional deficits with difficulties even with ADLs. She has had extensive non-op management including analgesics, injections of cortisone and viscosupplements, and home exercise program, but remains in significant pain with significant dysfunction.Radiographs show bone on bone arthritis medial and patellofemoral. She presents now for right Total Knee Arthroplasty.   Laboratory Data: Admission on 08/08/2012  Component Date Value Range Status  . ABO/RH(D) 08/08/2012 AB POS   Final  . Antibody Screen 08/08/2012 NEG   Final  . Sample Expiration 08/08/2012 08/11/2012   Final  . WBC 08/09/2012 13.6* 4.0 - 10.5 K/uL Final  . RBC 08/09/2012 3.51* 3.87 - 5.11 MIL/uL Final  . Hemoglobin 08/09/2012 10.2* 12.0 - 15.0 g/dL Final  . HCT 14/78/2956 30.5* 36.0 - 46.0 % Final  . MCV 08/09/2012 86.9  78.0 - 100.0 fL Final  . MCH  08/09/2012 29.1  26.0 - 34.0 pg Final  . MCHC 08/09/2012 33.4  30.0 - 36.0 g/dL Final  . RDW 21/30/8657 13.1  11.5 - 15.5 % Final  . Platelets 08/09/2012 274  150 - 400 K/uL Final  . Sodium 08/09/2012 135  135 - 145 mEq/L Final  . Potassium 08/09/2012 3.8  3.5 - 5.1 mEq/L Final  . Chloride 08/09/2012 102  96 - 112 mEq/L Final  . CO2 08/09/2012 24  19 - 32 mEq/L Final  . Glucose, Bld 08/09/2012 233* 70 - 99 mg/dL Final  . BUN 84/69/6295 14  6 - 23 mg/dL Final  . Creatinine, Ser 08/09/2012 0.59  0.50 - 1.10 mg/dL Final  . Calcium 28/41/3244 8.5  8.4 - 10.5 mg/dL Final  . GFR calc non Af Amer 08/09/2012 >90  >90 mL/min Final  . GFR calc Af Amer 08/09/2012 >90  >90 mL/min Final   Comment:                                 The eGFR has been calculated                          using the CKD EPI equation.                          This calculation has not been  validated in all clinical                          situations.                          eGFR's persistently                          <90 mL/min signify                          possible Chronic Kidney Disease.  . WBC 08/10/2012 17.5* 4.0 - 10.5 K/uL Final  . RBC 08/10/2012 3.07* 3.87 - 5.11 MIL/uL Final  . Hemoglobin 08/10/2012 9.0* 12.0 - 15.0 g/dL Final  . HCT 16/04/9603 26.7* 36.0 - 46.0 % Final  . MCV 08/10/2012 87.0  78.0 - 100.0 fL Final  . MCH 08/10/2012 29.3  26.0 - 34.0 pg Final  . MCHC 08/10/2012 33.7  30.0 - 36.0 g/dL Final  . RDW 54/03/8118 13.5  11.5 - 15.5 % Final  . Platelets 08/10/2012 262  150 - 400 K/uL Final  . Sodium 08/10/2012 137  135 - 145 mEq/L Final  . Potassium 08/10/2012 3.9  3.5 - 5.1 mEq/L Final  . Chloride 08/10/2012 102  96 - 112 mEq/L Final  . CO2 08/10/2012 26  19 - 32 mEq/L Final  . Glucose, Bld 08/10/2012 149* 70 - 99 mg/dL Final  . BUN 14/78/2956 17  6 - 23 mg/dL Final  . Creatinine, Ser 08/10/2012 0.62  0.50 - 1.10 mg/dL Final  . Calcium 21/30/8657 8.7  8.4 - 10.5 mg/dL  Final  . GFR calc non Af Amer 08/10/2012 >90  >90 mL/min Final  . GFR calc Af Amer 08/10/2012 >90  >90 mL/min Final   Comment:                                 The eGFR has been calculated                          using the CKD EPI equation.                          This calculation has not been                          validated in all clinical                          situations.                          eGFR's persistently                          <90 mL/min signify                          possible Chronic Kidney Disease.  Hospital Outpatient Visit on 07/29/2012  Component Date Value Range Status  . aPTT 07/29/2012 25  24 - 37 seconds Final  . WBC 07/29/2012 7.3  4.0 - 10.5 K/uL Final  . RBC  07/29/2012 4.32  3.87 - 5.11 MIL/uL Final  . Hemoglobin 07/29/2012 12.6  12.0 - 15.0 g/dL Final  . HCT 16/04/9603 38.0  36.0 - 46.0 % Final  . MCV 07/29/2012 88.0  78.0 - 100.0 fL Final  . MCH 07/29/2012 29.2  26.0 - 34.0 pg Final  . MCHC 07/29/2012 33.2  30.0 - 36.0 g/dL Final  . RDW 54/03/8118 12.6  11.5 - 15.5 % Final  . Platelets 07/29/2012 323  150 - 400 K/uL Final  . Sodium 07/29/2012 140  135 - 145 mEq/L Final  . Potassium 07/29/2012 4.3  3.5 - 5.1 mEq/L Final  . Chloride 07/29/2012 105  96 - 112 mEq/L Final  . CO2 07/29/2012 26  19 - 32 mEq/L Final  . Glucose, Bld 07/29/2012 98  70 - 99 mg/dL Final  . BUN 14/78/2956 19  6 - 23 mg/dL Final  . Creatinine, Ser 07/29/2012 0.83  0.50 - 1.10 mg/dL Final  . Calcium 21/30/8657 9.3  8.4 - 10.5 mg/dL Final  . Total Protein 07/29/2012 7.4  6.0 - 8.3 g/dL Final  . Albumin 84/69/6295 4.0  3.5 - 5.2 g/dL Final  . AST 28/41/3244 19  0 - 37 U/L Final  . ALT 07/29/2012 15  0 - 35 U/L Final  . Alkaline Phosphatase 07/29/2012 100  39 - 117 U/L Final  . Total Bilirubin 07/29/2012 0.7  0.3 - 1.2 mg/dL Final  . GFR calc non Af Amer 07/29/2012 71* >90 mL/min Final  . GFR calc Af Amer 07/29/2012 83* >90 mL/min Final   Comment:                                  The eGFR has been calculated                          using the CKD EPI equation.                          This calculation has not been                          validated in all clinical                          situations.                          eGFR's persistently                          <90 mL/min signify                          possible Chronic Kidney Disease.  Marland Kitchen Prothrombin Time 07/29/2012 13.1  11.6 - 15.2 seconds Final  . INR 07/29/2012 1.00  0.00 - 1.49 Final  . Color, Urine 07/29/2012 YELLOW  YELLOW Final  . APPearance 07/29/2012 CLEAR  CLEAR Final  . Specific Gravity, Urine 07/29/2012 1.022  1.005 - 1.030 Final  . pH 07/29/2012 5.5  5.0 - 8.0 Final  . Glucose, UA 07/29/2012 NEGATIVE  NEGATIVE mg/dL Final  . Hgb urine dipstick 07/29/2012 SMALL* NEGATIVE Final  . Bilirubin Urine 07/29/2012 NEGATIVE  NEGATIVE Final  . Ketones, ur  07/29/2012 NEGATIVE  NEGATIVE mg/dL Final  . Protein, ur 16/04/9603 NEGATIVE  NEGATIVE mg/dL Final  . Urobilinogen, UA 07/29/2012 0.2  0.0 - 1.0 mg/dL Final  . Nitrite 54/03/8118 NEGATIVE  NEGATIVE Final  . Leukocytes, UA 07/29/2012 SMALL* NEGATIVE Final  . MRSA, PCR 07/29/2012 NEGATIVE  NEGATIVE Final  . Staphylococcus aureus 07/29/2012 NEGATIVE  NEGATIVE Final   Comment:                                 The Xpert SA Assay (FDA                          approved for NASAL specimens                          in patients over 20 years of age),                          is one component of                          a comprehensive surveillance                          program.  Test performance has                          been validated by [employer] for patients greater                          than or equal to 20 year old.                          It is not intended                          to diagnose infection nor to                          guide or monitor treatment.  Marland Kitchen Specimen Description 07/29/2012 URINE,  RANDOM   Final  . Special Requests 07/29/2012 NONE   Final  . Culture  Setup Time 07/29/2012 07/29/2012 19:29   Final  . Colony Count 07/29/2012 25,000 COLONIES/ML   Final  . Culture 07/29/2012 Multiple bacterial morphotypes present, none predominant. Suggest appropriate recollection if clinically indicated.   Final  . Report Status 07/29/2012 07/30/2012 FINAL   Final  . Squamous Epithelial / LPF 07/29/2012 RARE  RARE Final  . WBC, UA 07/29/2012 3-6  <3 WBC/hpf Final  . RBC / HPF 07/29/2012 0-2  <3 RBC/hpf Final  . Bacteria, UA 07/29/2012 FEW* RARE Final     X-Rays:No results found.  EKG: Orders placed during the hospital encounter of 03/18/11  . EKG  . EKG  . EKG     Hospital Course: Jaanvi Fizer is a 67 y.o. who was admitted to [facility]. They were brought to the operating room on 08/08/2012 and underwent Procedure(s): TOTAL KNEE ARTHROPLASTY.  Patient tolerated the procedure well and was later transferred to the recovery room and then to the orthopaedic floor for postoperative care.  They were given PO and IV analgesics for pain control following their surgery.  They were given 24 hours of postoperative antibiotics of  Anti-infectives     Start     Dose/Rate Route Frequency Ordered Stop   08/08/12 1700   ceFAZolin (ANCEF) IVPB 2 g/50 mL premix        2 g 100 mL/hr over 30 Minutes Intravenous Every 6 hours 08/08/12 1416 08/08/12 2322   08/08/12 0923   ceFAZolin (ANCEF) IVPB 2 g/50 mL premix        2 g 100 mL/hr over 30 Minutes Intravenous 60 min pre-op 08/08/12 4401 08/08/12 1122         and started on DVT prophylaxis in the form of Xarelto.   PT and OT were ordered for total joint protocol.  Discharge planning consulted to help with postop disposition and equipment needs.  Patient had a tough night on the evening of surgery due to pain but started to get up OOB with therapy on day one walking 50 and then 90 feet. Hemovac drain was pulled without difficulty.   Continued to work with therapy into day two walking about 130 feet.  She also had her oral pain medication increased.  Dressing was changed on day two and the incision was healing well.  Patient was seen in rounds and was ready to go home later on POD 2 following the therapy sessions.   Discharge Medications: Prior to Admission medications   Medication Sig Start Date End Date Taking? Authorizing Provider  iron polysaccharides (NIFEREX) 150 MG capsule Take 1 capsule (150 mg total) by mouth daily. 08/10/12   Kerrilyn Azbill Julien Girt, PA  methocarbamol (ROBAXIN) 500 MG tablet Take 1 tablet (500 mg total) by mouth every 6 (six) hours as needed. 08/10/12   Garnet Chatmon, PA  oxyCODONE 10 MG TABS Take 1-2 tablets (10-20 mg total) by mouth every 3 (three) hours as needed. 08/10/12   Jojo Pehl Julien Girt, PA  rivaroxaban (XARELTO) 10 MG TABS tablet Take 1 tablet (10 mg total) by mouth daily with breakfast. 08/10/12   Olia Hinderliter Julien Girt, PA    Diet: Regular diet Activity:WBAT Follow-up:in 2 weeks Disposition - Home Discharged Condition: good   Discharge Orders    Future Orders Please Complete By Expires   Diet general      Call MD / Call 911      Comments:   If you experience chest pain or shortness of breath, CALL 911 and be transported to the hospital emergency room.  If you develope a fever above 101 F, pus (white drainage) or increased drainage or redness at the wound, or calf pain, call your surgeon's office.   Discharge instructions      Comments:   Pick up stool softner and laxative for home. Do not submerge incision under water. May shower. Continue to use ice for pain and swelling from surgery.  Take Xarelto for two and a half more weeks, then discontinue Xarelto.   Constipation Prevention      Comments:   Drink plenty of fluids.  Prune juice may be helpful.  You may use a stool softener, such as Colace (over the counter) 100 mg twice a day.  Use MiraLax (over the counter) for  constipation as needed.   Increase activity slowly as tolerated      Patient may shower  Comments:   You may shower without a dressing once there is no drainage.  Do not wash over the wound.  If drainage remains, do not shower until drainage stops.   Weight bearing as tolerated      Driving restrictions      Comments:   No driving until released by the physician.   Lifting restrictions      Comments:   No lifting until released by the physician.   TED hose      Comments:   Use stockings (TED hose) for 3 weeks on both leg(s).  You may remove them at night for sleeping.   Change dressing      Comments:   Change dressing daily with sterile 4 x 4 inch gauze dressing and apply TED hose. Do not submerge the incision under water.   Do not put a pillow under the knee. Place it under the heel.      Do not sit on low chairs, stoools or toilet seats, as it may be difficult to get up from low surfaces          Medication List     As of 08/10/2012  7:00 AM    STOP taking these medications         HYDROcodone-acetaminophen 5-325 MG per tablet   Commonly known as: NORCO/VICODIN      TAKE these medications         iron polysaccharides 150 MG capsule   Commonly known as: NIFEREX   Take 1 capsule (150 mg total) by mouth daily.      methocarbamol 500 MG tablet   Commonly known as: ROBAXIN   Take 1 tablet (500 mg total) by mouth every 6 (six) hours as needed.      Oxycodone HCl 10 MG Tabs   Take 1-2 tablets (10-20 mg total) by mouth every 3 (three) hours as needed.      rivaroxaban 10 MG Tabs tablet   Commonly known as: XARELTO   Take 1 tablet (10 mg total) by mouth daily with breakfast.           Follow-up Information    Follow up with Loanne Drilling, MD. Schedule an appointment as soon as possible for a visit in 2 weeks.   Contact information:   [address], [address], [place] [address] [place] [address] [phone]          Signed: Patrica Duel 08/10/2012, 7:00 AM

## 2012-08-10 NOTE — Progress Notes (Signed)
Patient is concerned about her tylenol intake with the hydrocodone and additional tylenol as needed that she has received this admission and request a note be attached to ensure that physicians allow for her to take oxycodone for pain management at home. Patient will follow up with attending ortho doctor regarding pain management concerns  during AM rounds.

## 2012-08-10 NOTE — Progress Notes (Signed)
Physical Therapy Treatment Patient Details Name: [name] MRN: [id number] DOB: [birth year] Today's Date: 08/10/2012 Time: 4098-1191 PT Time Calculation (min): 10 min  PT Assessment / Plan / Recommendation Comments on Treatment Session  2nd session. Practiced 1 step. Completed all education. No further questions/concerns from pt/husband. Recommend HHPT. Ready for d/c.    Follow Up Recommendations  Home health PT;Supervision - Intermittent     Does the patient have the potential to tolerate intense rehabilitation     Barriers to Discharge        Equipment Recommendations  Rolling walker with 5" wheels    Recommendations for Other Services    Frequency 7X/week   Plan Discharge plan remains appropriate    Precautions / Restrictions Precautions Precautions: Knee Restrictions Weight Bearing Restrictions: No RLE Weight Bearing: Weight bearing as tolerated   Pertinent Vitals/Pain 7/10 R knee    Mobility  Transfers Transfers: Sit to Stand;Stand to Sit Sit to Stand: 5: Supervision;From bed Stand to Sit: 5: Supervision;To bed Details for Transfer Assistance: VCs safety Ambulation/Gait Ambulation/Gait Assistance: 5: Supervision Ambulation Distance (Feet): 75 Feet Assistive device: Rolling walker Gait Pattern: Step-through pattern;Antalgic;Decreased stride length Stairs: Yes Stairs Assistance: 4: Min guard Stairs Assistance Details (indicate cue type and reason): VCs safety, technique, sequence. Pt familiar with technique.  Stair Management Technique: With walker;Forwards;Step to pattern Number of Stairs: 1     Exercises    PT Diagnosis:    PT Problem List:   PT Treatment Interventions:     PT Goals Acute Rehab PT Goals Pt will go Supine/Side to Sit: with supervision PT Goal: Supine/Side to Sit - Progress: Met Pt will go Sit to Stand: with supervision PT Goal: Sit to Stand - Progress: Met Pt will Ambulate: 51 - 150 feet;with supervision;with rolling  walker PT Goal: Ambulate - Progress: Met Pt will Go Up / Down Stairs: 1-2 stairs;with min assist;with rolling walker PT Goal: Up/Down Stairs - Progress: Met Pt will Perform Home Exercise Program: with supervision, verbal cues required/provided PT Goal: Perform Home Exercise Program - Progress: Progressing toward goal  Visit Information  Last PT Received On: 08/10/12 Assistance Needed: +1    Subjective Data  Subjective: "Im ready to go after you're finished with me" Patient Stated Goal: home   Cognition  Overall Cognitive Status: Appears within functional limits for tasks assessed/performed Arousal/Alertness: Awake/alert Orientation Level: Appears intact for tasks assessed Behavior During Session: [facility] for tasks performed    Balance     End of Session PT - End of Session Equipment Utilized During Treatment: Gait belt Activity Tolerance: Patient tolerated treatment well Patient left: in bed;with call bell/phone within reach;with family/visitor present   GP     Rebeca Alert [facility] 08/10/2012, 3:03 PM [phone]

## 2012-08-29 ENCOUNTER — Ambulatory Visit: Payer: Medicare Other | Attending: Orthopedic Surgery | Admitting: Physical Therapy

## 2012-08-29 DIAGNOSIS — R262 Difficulty in walking, not elsewhere classified: Secondary | ICD-10-CM | POA: Insufficient documentation

## 2012-08-29 DIAGNOSIS — IMO0001 Reserved for inherently not codable concepts without codable children: Secondary | ICD-10-CM | POA: Insufficient documentation

## 2012-08-29 DIAGNOSIS — M545 Low back pain, unspecified: Secondary | ICD-10-CM | POA: Insufficient documentation

## 2012-08-29 DIAGNOSIS — M25569 Pain in unspecified knee: Secondary | ICD-10-CM | POA: Insufficient documentation

## 2012-09-01 ENCOUNTER — Ambulatory Visit: Payer: Medicare Other | Admitting: Physical Therapy

## 2012-09-02 ENCOUNTER — Ambulatory Visit: Payer: Medicare Other | Admitting: Physical Therapy

## 2012-09-05 ENCOUNTER — Ambulatory Visit: Payer: Medicare Other | Admitting: Physical Therapy

## 2012-09-08 ENCOUNTER — Ambulatory Visit: Payer: Medicare Other | Admitting: Physical Therapy

## 2012-09-09 ENCOUNTER — Ambulatory Visit: Payer: Medicare Other | Admitting: Physical Therapy

## 2012-09-12 ENCOUNTER — Ambulatory Visit: Payer: Medicare Other | Admitting: Physical Therapy

## 2012-09-15 ENCOUNTER — Ambulatory Visit: Payer: Medicare Other | Attending: Orthopedic Surgery | Admitting: Physical Therapy

## 2012-09-15 DIAGNOSIS — R262 Difficulty in walking, not elsewhere classified: Secondary | ICD-10-CM | POA: Insufficient documentation

## 2012-09-15 DIAGNOSIS — M25569 Pain in unspecified knee: Secondary | ICD-10-CM | POA: Insufficient documentation

## 2012-09-15 DIAGNOSIS — IMO0001 Reserved for inherently not codable concepts without codable children: Secondary | ICD-10-CM | POA: Insufficient documentation

## 2012-09-15 DIAGNOSIS — M545 Low back pain, unspecified: Secondary | ICD-10-CM | POA: Insufficient documentation

## 2012-09-20 ENCOUNTER — Ambulatory Visit: Payer: Medicare Other | Admitting: Physical Therapy

## 2012-09-22 ENCOUNTER — Ambulatory Visit: Payer: Medicare Other | Admitting: Physical Therapy

## 2012-10-05 ENCOUNTER — Ambulatory Visit: Payer: Medicare Other | Admitting: Physical Therapy

## 2012-10-11 ENCOUNTER — Ambulatory Visit: Payer: Medicare Other | Attending: Orthopedic Surgery | Admitting: Physical Therapy

## 2012-10-11 DIAGNOSIS — M25569 Pain in unspecified knee: Secondary | ICD-10-CM | POA: Insufficient documentation

## 2012-10-11 DIAGNOSIS — R262 Difficulty in walking, not elsewhere classified: Secondary | ICD-10-CM | POA: Insufficient documentation

## 2012-10-11 DIAGNOSIS — IMO0001 Reserved for inherently not codable concepts without codable children: Secondary | ICD-10-CM | POA: Insufficient documentation

## 2012-10-11 DIAGNOSIS — M545 Low back pain, unspecified: Secondary | ICD-10-CM | POA: Insufficient documentation

## 2012-10-13 ENCOUNTER — Ambulatory Visit: Payer: Medicare Other | Admitting: Physical Therapy

## 2012-10-17 ENCOUNTER — Ambulatory Visit: Payer: Medicare Other | Admitting: Physical Therapy

## 2012-10-20 ENCOUNTER — Ambulatory Visit: Payer: Medicare Other | Admitting: Physical Therapy

## 2012-10-24 ENCOUNTER — Ambulatory Visit: Payer: Medicare Other | Admitting: Physical Therapy

## 2012-11-02 ENCOUNTER — Ambulatory Visit: Payer: Medicare Other | Admitting: Physical Therapy

## 2013-02-09 ENCOUNTER — Other Ambulatory Visit: Payer: Self-pay | Admitting: Urology

## 2013-02-10 ENCOUNTER — Encounter (HOSPITAL_BASED_OUTPATIENT_CLINIC_OR_DEPARTMENT_OTHER): Payer: Self-pay | Admitting: *Deleted

## 2013-02-10 NOTE — H&P (Signed)
Reason For Visit       [name] is a 67 year old female with nephrolithiasis seen in followup for a right ureteral stone.   History of Present Illness         She has a history of medullary sponge kidney and kidney stones and has undergone lithotripsy twice in the past for stones on the right-hand side. She has also required ureteroscopy for residual fragments after her first lithotripsy. On a CT scan done in 1/10 she had no right renal calculi and a 2.4 x 1.2 cm stone in the upper portion of her right kidney without evidence of obstruction. Hounsfield units were ~800-900. A KUB in 9/12 revealed the stone had increased in size to 3 x 3 cm. 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 Right UPJ stone treated with ESL in 11/13 24 hour urine: She was found to have no significant abnormality. Stone analysis: calcium oxalate and calcium phosphate.  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  Interval history: She is doing well. She has intermittent discomfort but it's not severe. She has not seen her stone pass. She is beginning to have increased frequency and urge to urinate. She has not required any pain medication I gave her.   Past Medical History Problems  1. History of  Anxiety (Symptom) 300.00 2. History of  Arthritis V13.4 3. History of  Proximal Ureteral Stone On The Right 592.1  Surgical History Problems  1. History of  Back Surgery 2. History of  Cholecystectomy 3. History of  Knee Replacement Left 4. History of  Lithotripsy Right 5. History of  Lithotripsy Right 6. History of  Lithotripsy 7. History of  Rotator Cuff Repair 8. History of  Spine Repair  Current Meds 1. OxyCODONE HCl 5 MG Oral Capsule; 1-2 pills every  4-6 hours as needed for pain; Therapy:  15Jul2014 to (Last Rx:15Jul2014) 2. OxyCODONE HCl 5 MG Oral Tablet; Therapy: (Recorded:15Jul2014) to  Allergies Medication  1. Sulfa Drugs  Family History Problems  1. Paternal history of  Death In The Family Father 43 - lung cancer 2. Paternal history of  Death In The Family Mother 59 lung cancer 3. Maternal history of  Diabetes Mellitus V18.0 4. Maternal history of  Heart Disease V17.49 5. Maternal history of  Lung Cancer V16.1 6. Paternal history of  Lung Cancer V16.1 Denied  7. Paternal history of  Family Health Status Number Of Children 2 daughters  Social History Problems  1. Caffeine Use x3 2. Marital History - Currently Married 3. 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 History of  Alcohol Use 6. History of  Tobacco Use  Review of Systems Genitourinary, constitutional, skin, eye, otolaryngeal, hematologic/lymphatic, cardiovascular, pulmonary, endocrine, musculoskeletal, gastrointestinal, neurological and psychiatric system(s) were reviewed and pertinent findings if present are noted.  Genitourinary: urinary urgency, dysuria, nocturia and incontinence.  Gastrointestinal: nausea and vomiting.  Musculoskeletal: back pain and joint pain.  Neurological: dizziness and headache.    Vitals Vital Signs Blood Pressure:  128/88 Temperature Temperature: 97.26F Pulse Heart Rate: 91 11Jan2010 11:08AM  Temperature Temperature: 97.26F  Physical Exam Constitutional: Well nourished and well developed. No acute distress.  ENT: The ears and nose are normal in appearance.  Neck: The appearance of the neck  is normal and no neck mass is present.  Pulmonary: No respiratory distress and normal respiratory rhythm and effort.  Cardiovascular: Heart rate and rhythm are normal. No peripheral edema.  Abdomen: The abdomen is soft and nontender. No masses are palpated. No CVA tenderness. No hernias are palpable. No hepatosplenomegaly  noted.  Lymphatics: The femoral and inguinal nodes are not enlarged or tender.  Skin: Normal skin turgor, no visible rash and no visible skin lesions.  Neuro/Psych: Mood and affect are appropriate.   Assessment    I reviewed her KUB images with her today. The calcification in the pelvis on the right-hand side appears to be unchanged. It is not present on a previous CT scan nor on her initial KUB in 5/14 leading me to believe that it is very possibly a stone although it seems to be a little bit lateral than I would expect. Because she has a large stone in her left kidney I told her that we had 2 options the first would be to obtain a CT scan which would allow me to confirm whether this was a stone in her distal ureter or not and also rule out obstruction or proceed with retrograde pyelogram and management of the stone if it was determined to be in her distal ureter. We have decided to proceed with ureteroscopic evaluation and I discussed the procedure with her in detail including its risks and palpitations, alternatives, anticipated postoperative course as well as probability of success. She understands and has elected to proceed.   Plan    She will be scheduled for outpatient right retrograde pyelogram with possible right ureteroscopy and laser lithotripsy.

## 2013-02-10 NOTE — Progress Notes (Signed)
NPO AFTER MN. ARRIVES AT 0945. NEEDS KUB AND HG. MAY TAKE OXYCODONE AM OF SURG W/ SIP OF WATER.

## 2013-02-13 ENCOUNTER — Ambulatory Visit (HOSPITAL_COMMUNITY): Payer: Medicare Other

## 2013-02-13 ENCOUNTER — Encounter (HOSPITAL_BASED_OUTPATIENT_CLINIC_OR_DEPARTMENT_OTHER)
Admission: RE | Disposition: A | Discharge status: Home / Self Care | Payer: Self-pay | Source: Ambulatory Visit | Attending: Urology

## 2013-02-13 ENCOUNTER — Ambulatory Visit (HOSPITAL_BASED_OUTPATIENT_CLINIC_OR_DEPARTMENT_OTHER)
Admission: RE | Admit: 2013-02-13 | Discharge: 2013-02-13 | Disposition: A | Discharge status: Home / Self Care | Payer: Medicare Other | Source: Ambulatory Visit | Attending: Urology | Admitting: Urology

## 2013-02-13 ENCOUNTER — Encounter (HOSPITAL_BASED_OUTPATIENT_CLINIC_OR_DEPARTMENT_OTHER): Payer: Self-pay | Admitting: *Deleted

## 2013-02-13 ENCOUNTER — Encounter (HOSPITAL_BASED_OUTPATIENT_CLINIC_OR_DEPARTMENT_OTHER): Payer: Self-pay | Admitting: Anesthesiology

## 2013-02-13 ENCOUNTER — Ambulatory Visit (HOSPITAL_BASED_OUTPATIENT_CLINIC_OR_DEPARTMENT_OTHER): Payer: Medicare Other | Admitting: Anesthesiology

## 2013-02-13 DIAGNOSIS — Z6841 Body Mass Index (BMI) 40.0 and over, adult: Secondary | ICD-10-CM | POA: Insufficient documentation

## 2013-02-13 DIAGNOSIS — I499 Cardiac arrhythmia, unspecified: Secondary | ICD-10-CM | POA: Insufficient documentation

## 2013-02-13 DIAGNOSIS — N3946 Mixed incontinence: Secondary | ICD-10-CM | POA: Insufficient documentation

## 2013-02-13 DIAGNOSIS — Z882 Allergy status to sulfonamides status: Secondary | ICD-10-CM | POA: Insufficient documentation

## 2013-02-13 DIAGNOSIS — R351 Nocturia: Secondary | ICD-10-CM | POA: Insufficient documentation

## 2013-02-13 DIAGNOSIS — N200 Calculus of kidney: Secondary | ICD-10-CM | POA: Insufficient documentation

## 2013-02-13 DIAGNOSIS — N201 Calculus of ureter: Secondary | ICD-10-CM | POA: Insufficient documentation

## 2013-02-13 DIAGNOSIS — Q615 Medullary cystic kidney: Secondary | ICD-10-CM | POA: Insufficient documentation

## 2013-02-13 DIAGNOSIS — F411 Generalized anxiety disorder: Secondary | ICD-10-CM | POA: Insufficient documentation

## 2013-02-13 DIAGNOSIS — M129 Arthropathy, unspecified: Secondary | ICD-10-CM | POA: Insufficient documentation

## 2013-02-13 HISTORY — DX: Other intervertebral disc degeneration, lumbosacral region: M51.37

## 2013-02-13 HISTORY — PX: CYSTOSCOPY WITH RETROGRADE PYELOGRAM, URETEROSCOPY AND STENT PLACEMENT: SHX5789

## 2013-02-13 HISTORY — DX: Atrioventricular block, first degree: I44.0

## 2013-02-13 HISTORY — DX: Postlaminectomy syndrome, not elsewhere classified: M96.1

## 2013-02-13 HISTORY — PX: HOLMIUM LASER APPLICATION: SHX5852

## 2013-02-13 HISTORY — DX: Calculus of kidney: N20.0

## 2013-02-13 HISTORY — DX: Other intervertebral disc degeneration, lumbosacral region without mention of lumbar back pain or lower extremity pain: M51.379

## 2013-02-13 HISTORY — DX: Calculus of ureter: N20.1

## 2013-02-13 LAB — POCT HEMOGLOBIN-HEMACUE: Hemoglobin: 12.6 g/dL (ref 12.0–15.0)

## 2013-02-13 SURGERY — CYSTOURETEROSCOPY, WITH RETROGRADE PYELOGRAM AND STENT INSERTION
Anesthesia: General | Site: Ureter | Laterality: Right | Wound class: Clean Contaminated

## 2013-02-13 MED ORDER — LACTATED RINGERS IV SOLN
INTRAVENOUS | Status: DC | PRN
  Administered 2013-02-13 (×2): via INTRAVENOUS

## 2013-02-13 MED ORDER — IOHEXOL 350 MG/ML SOLN
INTRAVENOUS | Status: DC | PRN
  Administered 2013-02-13: 6 mL

## 2013-02-13 MED ORDER — MEPERIDINE HCL 25 MG/ML IJ SOLN
6.2500 mg | INTRAMUSCULAR | Status: DC | PRN
  Filled 2013-02-13: qty 1

## 2013-02-13 MED ORDER — DEXAMETHASONE SODIUM PHOSPHATE 4 MG/ML IJ SOLN
INTRAMUSCULAR | Status: DC | PRN
  Administered 2013-02-13: 10 mg via INTRAVENOUS

## 2013-02-13 MED ORDER — ONDANSETRON HCL 4 MG/2ML IJ SOLN
INTRAMUSCULAR | Status: DC | PRN
  Administered 2013-02-13: 4 mg via INTRAVENOUS

## 2013-02-13 MED ORDER — OXYCODONE HCL 5 MG PO TABS
5.0000 mg | ORAL_TABLET | Freq: Once | ORAL | Status: AC | PRN
  Administered 2013-02-13: 5 mg via ORAL
  Filled 2013-02-13: qty 1

## 2013-02-13 MED ORDER — MIDAZOLAM HCL 5 MG/5ML IJ SOLN
INTRAMUSCULAR | Status: DC | PRN
  Administered 2013-02-13 (×2): 1 mg via INTRAVENOUS

## 2013-02-13 MED ORDER — LACTATED RINGERS IV SOLN
INTRAVENOUS | Status: DC
  Administered 2013-02-13: 10:00:00 via INTRAVENOUS
  Filled 2013-02-13: qty 1000

## 2013-02-13 MED ORDER — PROMETHAZINE HCL 25 MG/ML IJ SOLN
6.2500 mg | INTRAMUSCULAR | Status: DC | PRN
  Filled 2013-02-13: qty 1

## 2013-02-13 MED ORDER — HYDROMORPHONE HCL PF 1 MG/ML IJ SOLN
0.2500 mg | INTRAMUSCULAR | Status: DC | PRN
  Filled 2013-02-13: qty 1

## 2013-02-13 MED ORDER — PROPOFOL 10 MG/ML IV BOLUS
INTRAVENOUS | Status: DC | PRN
  Administered 2013-02-13: 200 mg via INTRAVENOUS

## 2013-02-13 MED ORDER — SODIUM CHLORIDE 0.9 % IR SOLN
Status: DC | PRN
  Administered 2013-02-13: 6000 mL via INTRAVESICAL

## 2013-02-13 MED ORDER — CIPROFLOXACIN IN D5W 200 MG/100ML IV SOLN
200.0000 mg | INTRAVENOUS | Status: AC
  Administered 2013-02-13: 200 mg via INTRAVENOUS
  Filled 2013-02-13: qty 100

## 2013-02-13 MED ORDER — FENTANYL CITRATE 0.05 MG/ML IJ SOLN
INTRAMUSCULAR | Status: DC | PRN
  Administered 2013-02-13: 50 ug via INTRAVENOUS
  Administered 2013-02-13 (×2): 25 ug via INTRAVENOUS

## 2013-02-13 MED ORDER — PHENAZOPYRIDINE HCL 200 MG PO TABS
200.0000 mg | ORAL_TABLET | Freq: Once | ORAL | Status: AC
  Administered 2013-02-13: 200 mg via ORAL
  Filled 2013-02-13: qty 1

## 2013-02-13 MED ORDER — PHENAZOPYRIDINE HCL 200 MG PO TABS: 200.0000 mg | ORAL_TABLET | Freq: Three times a day (TID) | ORAL | Status: DC | PRN

## 2013-02-13 MED ORDER — OXYCODONE HCL 5 MG/5ML PO SOLN
5.0000 mg | Freq: Once | ORAL | Status: AC | PRN
  Filled 2013-02-13: qty 5

## 2013-02-13 MED ORDER — HYDROCODONE-ACETAMINOPHEN 10-325 MG PO TABS: 1.0000 | ORAL_TABLET | Freq: Four times a day (QID) | ORAL | Status: DC | PRN

## 2013-02-13 MED ORDER — KETOROLAC TROMETHAMINE 30 MG/ML IJ SOLN
INTRAMUSCULAR | Status: DC | PRN
  Administered 2013-02-13: 30 mg via INTRAVENOUS

## 2013-02-13 MED ORDER — LIDOCAINE HCL (CARDIAC) 20 MG/ML IV SOLN
INTRAVENOUS | Status: DC | PRN
  Administered 2013-02-13: 100 mg via INTRAVENOUS

## 2013-02-13 SURGICAL SUPPLY — 35 items
ADAPTER CATH URET PLST 4-6FR (CATHETERS) IMPLANT
BAG DRAIN URO-CYSTO SKYTR STRL (DRAIN) ×3 IMPLANT
BASKET LASER NITINOL 1.9FR (BASKET) IMPLANT
BASKET STNLS GEMINI 4WIRE 3FR (BASKET) IMPLANT
BASKET ZERO TIP NITINOL 2.4FR (BASKET) ×3 IMPLANT
BRUSH URET BIOPSY 3F (UROLOGICAL SUPPLIES) IMPLANT
CANISTER SUCT LVC 12 LTR MEDI- (MISCELLANEOUS) ×3 IMPLANT
CATH CLEAR GEL 3F BACKSTOP (CATHETERS) IMPLANT
CATH INTERMIT  6FR 70CM (CATHETERS) IMPLANT
CATH URET 5FR 28IN CONE TIP (BALLOONS)
CATH URET 5FR 70CM CONE TIP (BALLOONS) IMPLANT
CLOTH BEACON ORANGE TIMEOUT ST (SAFETY) ×3 IMPLANT
DRAPE CAMERA CLOSED 9X96 (DRAPES) ×3 IMPLANT
ELECT REM PT RETURN 9FT ADLT (ELECTROSURGICAL)
ELECTRODE REM PT RTRN 9FT ADLT (ELECTROSURGICAL) IMPLANT
GLOVE BIO SURGEON STRL SZ8 (GLOVE) ×3 IMPLANT
GLOVE ECLIPSE 7.0 STRL STRAW (GLOVE) ×3 IMPLANT
GLOVE INDICATOR 8.0 STRL GRN (GLOVE) ×3 IMPLANT
GOWN PREVENTION PLUS LG XLONG (DISPOSABLE) ×3 IMPLANT
GOWN STRL REIN XL XLG (GOWN DISPOSABLE) ×3 IMPLANT
GUIDEWIRE 0.038 PTFE COATED (WIRE) IMPLANT
GUIDEWIRE ANG ZIPWIRE 038X150 (WIRE) IMPLANT
GUIDEWIRE STR DUAL SENSOR (WIRE) ×3 IMPLANT
IV NS IRRIG 3000ML ARTHROMATIC (IV SOLUTION) ×6 IMPLANT
KIT BALLIN UROMAX 15FX10 (LABEL) IMPLANT
KIT BALLN UROMAX 15FX4 (MISCELLANEOUS) IMPLANT
KIT BALLN UROMAX 26 75X4 (MISCELLANEOUS)
LASER FIBER DISP (UROLOGICAL SUPPLIES) ×3 IMPLANT
PACK CYSTOSCOPY (CUSTOM PROCEDURE TRAY) ×3 IMPLANT
SET HIGH PRES BAL DIL (LABEL)
SHEATH ACCESS URETERAL 38CM (SHEATH) ×3 IMPLANT
SHEATH ACCESS URETERAL 54CM (SHEATH) IMPLANT
SHEATH URET ACCESS 12FR/35CM (UROLOGICAL SUPPLIES) IMPLANT
SHEATH URET ACCESS 12FR/55CM (UROLOGICAL SUPPLIES) IMPLANT
WATER STERILE IRR 3000ML UROMA (IV SOLUTION) IMPLANT

## 2013-02-13 NOTE — Anesthesia Postprocedure Evaluation (Signed)
Anesthesia Post Note  Patient: [name]  Procedure(s) Performed: Procedure(s) (LRB): CYSTOSCOPY WITH RETROGRADE PYELOGRAM, URETEROSCOPY AND LITHOTRIPSY  (N/A) HOLMIUM LASER APPLICATION (Right)  Anesthesia type: General  Patient location: PACU  Post pain: Pain level controlled  Post assessment: Post-op Vital signs reviewed  Last Vitals: BP 169/80  Pulse 60  Temp(Src) 36.1 C (Oral)  Resp 18  Ht 5\' 1"  (1.549 m)  Wt 225 lb (102.059 kg)  BMI 42.54 kg/m2  SpO2 99%  Post vital signs: Reviewed  Level of consciousness: sedated  Complications: No apparent anesthesia complications

## 2013-02-13 NOTE — Anesthesia Preprocedure Evaluation (Addendum)
Anesthesia Evaluation  Patient identified by MRN, date of birth, ID band Patient awake    Reviewed: Allergy & Precautions, H&P , NPO status , Patient's Chart, lab work & pertinent test results  History of Anesthesia Complications (+) PONV  Airway Mallampati: II TM Distance: >3 FB Neck ROM: full    Dental no notable dental hx. (+) Teeth Intact and Dental Advisory Given   Pulmonary neg pulmonary ROS,  breath sounds clear to auscultation  Pulmonary exam normal       Cardiovascular Exercise Tolerance: Good + dysrhythmias Rhythm:regular Rate:Normal     Neuro/Psych negative neurological ROS  negative psych ROS   GI/Hepatic negative GI ROS, Neg liver ROS,   Endo/Other  Morbid obesity  Renal/GU Renal disease     Musculoskeletal   Abdominal (+) + obese,   Peds  Hematology negative hematology ROS (+)   Anesthesia Other Findings   Reproductive/Obstetrics negative OB ROS                           Anesthesia Physical  Anesthesia Plan  ASA: III  Anesthesia Plan: General   Post-op Pain Management:    Induction: Intravenous  Airway Management Planned: Oral ETT and LMA  Additional Equipment:   Intra-op Plan:   Post-operative Plan: Extubation in OR  Informed Consent: I have reviewed the patients History and Physical, chart, labs and discussed the procedure including the risks, benefits and alternatives for the proposed anesthesia with the patient or authorized representative who has indicated his/her understanding and acceptance.   Dental Advisory Given  Plan Discussed with: CRNA  Anesthesia Plan Comments:       Anesthesia Quick Evaluation

## 2013-02-13 NOTE — Transfer of Care (Signed)
  Immediate Anesthesia Transfer of Care Note  Patient: [name]  Procedure(s) Performed: Procedure(s) (LRB): CYSTOSCOPY WITH RETROGRADE PYELOGRAM, URETEROSCOPY AND LITHOTRIPSY  (N/A) HOLMIUM LASER APPLICATION (Right)  Patient Location: PACU  Anesthesia Type: General  Level of Consciousness: awake, sedated, patient cooperative and responds to stimulation  Airway & Oxygen Therapy: Patient Spontanous Breathing and Patient connected to face mask oxygen  Post-op Assessment: Report given to PACU RN, Post -op Vital signs reviewed and stable and Patient moving all extremities  Post vital signs: Reviewed and stable  Complications: No apparent anesthesia complications

## 2013-02-13 NOTE — Anesthesia Procedure Notes (Signed)
Procedure Name: LMA Insertion Date/Time: 02/13/2013 11:30 AM Performed by: Jessica Priest Pre-anesthesia Checklist: Patient identified, Emergency Drugs available, Suction available and Patient being monitored Patient Re-evaluated:Patient Re-evaluated prior to inductionOxygen Delivery Method: Circle System Utilized Preoxygenation: Pre-oxygenation with 100% oxygen Intubation Type: IV induction Ventilation: Mask ventilation without difficulty LMA: LMA inserted LMA Size: 4.0 Number of attempts: 1 Airway Equipment and Method: bite block Placement Confirmation: positive ETCO2 Tube secured with: Tape Dental Injury: Teeth and Oropharynx as per pre-operative assessment

## 2013-02-13 NOTE — Op Note (Signed)
PATIENT:  [name]  PRE-OPERATIVE DIAGNOSIS:  right Ureteral calculus  POST-OPERATIVE DIAGNOSIS: Same  PROCEDURE:  1. Cystoscopy with right retrograde pyelogram including interpretation. 2. Right ureteroscopy and laser lithotripsy. 3. Right ureteral stone extraction.  SURGEON: Garnett Farm, MD  INDICATION: [name] is a 67 year old female patient with a history of calculus disease. She's had a right ureteral stone that has progressed down the ureter but appears to have held up in the distal ureter. Due to failure of progression we discussed treating this with ureteroscopy and laser lithotripsy if the calcification, seen on her x-rays, is in fact her distal ureteral stone.  ANESTHESIA:  General  EBL:  Minimal  DRAINS: None  SPECIMEN:  Stone given to patient  DESCRIPTION OF PROCEDURE: The patient was taken to the major OR and placed on the table. General anesthesia was administered and then the patient was moved to the dorsal lithotomy position. The genitalia was sterilely prepped and draped. An official timeout was performed.  Initially the 22 French cystoscope with 12 lens was passed under direct vision into the bladder. The bladder was then entered and fully inspected. It was noted be free of any tumors stones or inflammatory lesions. Ureteral orifices were of normal configuration and position. A 6 French open-ended ureteral catheter was then passed through the cystoscope into the ureteral orifice in order to perform a right retrograde pyelogram.  A retrograde pyelogram was performed by injecting full-strength contrast up the right ureter under direct fluoroscopic control. It revealed a filling defect in the distal lureter consistent with the stone seen on the preoperative KUB. The remainder of the ureter was noted to be normal as was the intrarenal collecting system. I then passed a 0.038 inch floppy-tipped guidewire through the open ended catheter and into the area of the  renal pelvis and this was left in place. The inner portion of a ureteral access sheath was then passed over the guidewire to gently dilate the intramural ureter. I then proceeded with ureteroscopy.  A 6 French rigid ureteroscope was then passed under direct into the bladder and into the right orifice and up the ureter. The stone was identified and I felt it was too large to extract and therefore elected to proceed with laser lithotripsy. The 200  holmium laser fiber was used to fragment the stone. I then used the nitinol basket to extract all of the stone fragments and reinspection of the ureter ureteroscopically revealed no further stone fragments and no injury to the ureter. Due to the ease of the stone extraction I elected to not leave a stent. The bladder was drained and the cystoscope was then removed. The patient tolerated the procedure well no intraoperative complications.  PLAN OF CARE: Discharge to home after PACU  PATIENT DISPOSITION:  PACU - hemodynamically stable.

## 2013-02-13 NOTE — Transfer of Care (Signed)
Immediate Anesthesia Transfer of Care Note  Patient: [name]  Procedure(s) Performed: Procedure(s) (LRB): CYSTOSCOPY WITH RETROGRADE PYELOGRAM, URETEROSCOPY AND LITHOTRIPSY  (N/A) HOLMIUM LASER APPLICATION (Right)  Patient Location: PACU  Anesthesia Type: General  Level of Consciousness: awake, sedated, patient cooperative and responds to stimulation  Airway & Oxygen Therapy: Patient Spontanous Breathing and Patient connected to face mask oxygen  Post-op Assessment: Report given to PACU RN, Post -op Vital signs reviewed and stable and Patient moving all extremities  Post vital signs: Reviewed and stable  Complications: No apparent anesthesia complications

## 2013-02-13 NOTE — Interval H&P Note (Signed)
History and Physical Interval Note:  02/13/2013 11:12 AM  [name]  has presented today for surgery, with the diagnosis of RIGHT URETERAL STONE  The various methods of treatment have been discussed with the patient and family. After consideration of risks, benefits and other options for treatment, the patient has consented to  Procedure(s): CYSTOSCOPY WITH RETROGRADE PYELOGRAM, URETEROSCOPY AND LITHOTRIPSY  (N/A) HOLMIUM LASER APPLICATION (Right) as a surgical intervention .  The patient's history has been reviewed, patient examined, no change in status, stable for surgery.  I have reviewed the patient's chart and labs.  Questions were answered to the patient's satisfaction.     [name]

## 2013-02-14 ENCOUNTER — Encounter (HOSPITAL_BASED_OUTPATIENT_CLINIC_OR_DEPARTMENT_OTHER): Payer: Self-pay | Admitting: Urology

## 2013-09-04 ENCOUNTER — Other Ambulatory Visit: Payer: Self-pay | Admitting: Family Medicine

## 2014-05-02 ENCOUNTER — Encounter (HOSPITAL_COMMUNITY): Payer: Self-pay | Admitting: Emergency Medicine

## 2014-05-02 ENCOUNTER — Inpatient Hospital Stay (HOSPITAL_COMMUNITY)
Admission: EM | Admit: 2014-05-02 | Discharge: 2014-05-04 | DRG: 152 | Disposition: A | Discharge status: Home / Self Care | Payer: Medicare HMO | Attending: Internal Medicine | Admitting: Internal Medicine

## 2014-05-02 ENCOUNTER — Emergency Department (HOSPITAL_COMMUNITY): Payer: Medicare HMO

## 2014-05-02 DIAGNOSIS — J9601 Acute respiratory failure with hypoxia: Secondary | ICD-10-CM | POA: Diagnosis present

## 2014-05-02 DIAGNOSIS — R0602 Shortness of breath: Secondary | ICD-10-CM | POA: Diagnosis not present

## 2014-05-02 DIAGNOSIS — I5189 Other ill-defined heart diseases: Secondary | ICD-10-CM | POA: Diagnosis present

## 2014-05-02 DIAGNOSIS — J9600 Acute respiratory failure, unspecified whether with hypoxia or hypercapnia: Secondary | ICD-10-CM | POA: Insufficient documentation

## 2014-05-02 DIAGNOSIS — Z96653 Presence of artificial knee joint, bilateral: Secondary | ICD-10-CM | POA: Diagnosis present

## 2014-05-02 DIAGNOSIS — I480 Paroxysmal atrial fibrillation: Secondary | ICD-10-CM | POA: Diagnosis present

## 2014-05-02 DIAGNOSIS — I445 Left posterior fascicular block: Secondary | ICD-10-CM | POA: Diagnosis present

## 2014-05-02 DIAGNOSIS — R6511 Systemic inflammatory response syndrome (SIRS) of non-infectious origin with acute organ dysfunction: Secondary | ICD-10-CM | POA: Diagnosis present

## 2014-05-02 DIAGNOSIS — J209 Acute bronchitis, unspecified: Secondary | ICD-10-CM | POA: Diagnosis present

## 2014-05-02 DIAGNOSIS — R000 Tachycardia, unspecified: Secondary | ICD-10-CM | POA: Diagnosis present

## 2014-05-02 DIAGNOSIS — E669 Obesity, unspecified: Secondary | ICD-10-CM | POA: Diagnosis present

## 2014-05-02 DIAGNOSIS — R7303 Prediabetes: Secondary | ICD-10-CM | POA: Diagnosis present

## 2014-05-02 DIAGNOSIS — J069 Acute upper respiratory infection, unspecified: Secondary | ICD-10-CM | POA: Diagnosis present

## 2014-05-02 DIAGNOSIS — R062 Wheezing: Secondary | ICD-10-CM

## 2014-05-02 DIAGNOSIS — I5032 Chronic diastolic (congestive) heart failure: Secondary | ICD-10-CM | POA: Diagnosis present

## 2014-05-02 DIAGNOSIS — I4891 Unspecified atrial fibrillation: Secondary | ICD-10-CM | POA: Diagnosis present

## 2014-05-02 DIAGNOSIS — R6510 Systemic inflammatory response syndrome (SIRS) of non-infectious origin without acute organ dysfunction: Secondary | ICD-10-CM | POA: Diagnosis present

## 2014-05-02 DIAGNOSIS — R0902 Hypoxemia: Secondary | ICD-10-CM

## 2014-05-02 LAB — CBC
HCT: 37.7 % (ref 36.0–46.0)
HEMATOCRIT: 40.5 % (ref 36.0–46.0)
Hemoglobin: 13.8 g/dL (ref 12.0–15.0)
Hemoglobin: 12.1 g/dL (ref 12.0–15.0)
MCH: 30 pg (ref 26.0–34.0)
MCH: 28.8 pg (ref 26.0–34.0)
MCHC: 34.1 g/dL (ref 30.0–36.0)
MCHC: 32.1 g/dL (ref 30.0–36.0)
MCV: 88 fL (ref 78.0–100.0)
MCV: 89.8 fL (ref 78.0–100.0)
Platelets: 423 10*3/uL — ABNORMAL HIGH (ref 150–400)
Platelets: 313 10*3/uL (ref 150–400)
RBC: 4.6 MIL/uL (ref 3.87–5.11)
RBC: 4.2 MIL/uL (ref 3.87–5.11)
RDW: 12.4 % (ref 11.5–15.5)
RDW: 12.6 % (ref 11.5–15.5)
WBC: 14.4 10*3/uL — ABNORMAL HIGH (ref 4.0–10.5)
WBC: 15.2 10*3/uL — ABNORMAL HIGH (ref 4.0–10.5)

## 2014-05-02 LAB — BASIC METABOLIC PANEL
Anion gap: 19 — ABNORMAL HIGH (ref 5–15)
BUN: 21 mg/dL (ref 6–23)
CHLORIDE: 102 meq/L (ref 96–112)
CO2: 21 mEq/L (ref 19–32)
CREATININE: 0.85 mg/dL (ref 0.50–1.10)
Calcium: 9.4 mg/dL (ref 8.4–10.5)
GFR, EST AFRICAN AMERICAN: 79 mL/min — AB (ref >90)
GFR, EST NON AFRICAN AMERICAN: 68 mL/min — AB (ref >90)
Glucose, Bld: 118 mg/dL — ABNORMAL HIGH (ref 70–99)
Potassium: 3.8 mEq/L (ref 3.7–5.3)
Sodium: 142 mEq/L (ref 137–147)

## 2014-05-02 LAB — BLOOD GAS, ARTERIAL
ACID-BASE DEFICIT: 3.4 mmol/L — AB (ref 0.0–2.0)
Bicarbonate: 20.3 mEq/L (ref 20.0–24.0)
DRAWN BY: 331471
O2 CONTENT: 2 L/min
O2 Saturation: 98.3 %
PCO2 ART: 34 mmHg — AB (ref 35.0–45.0)
PH ART: 7.394 (ref 7.350–7.450)
PO2 ART: 125 mmHg — AB (ref 80.0–100.0)
Patient temperature: 98.6
TCO2: 18.2 mmol/L (ref 0–100)

## 2014-05-02 LAB — TROPONIN I: Troponin I: <0.3 ng/mL (ref <0.30)

## 2014-05-02 LAB — CREATININE, SERUM
Creatinine, Ser: 0.86 mg/dL (ref 0.50–1.10)
GFR calc Af Amer: 78 mL/min — ABNORMAL LOW (ref >90)
GFR calc non Af Amer: 67 mL/min — ABNORMAL LOW (ref >90)

## 2014-05-02 LAB — I-STAT TROPONIN, ED: Troponin i, poc: 0 ng/mL (ref 0.00–0.08)

## 2014-05-02 MED ORDER — SENNA 8.6 MG PO TABS
1.0000 | ORAL_TABLET | Freq: Two times a day (BID) | ORAL | Status: DC
  Administered 2014-05-02 – 2014-05-04 (×4): 8.6 mg via ORAL
  Filled 2014-05-02 (×4): qty 1

## 2014-05-02 MED ORDER — METHYLPREDNISOLONE SODIUM SUCC 125 MG IJ SOLR
60.0000 mg | Freq: Three times a day (TID) | INTRAMUSCULAR | Status: DC
  Administered 2014-05-02 – 2014-05-04 (×5): 60 mg via INTRAVENOUS
  Filled 2014-05-02 (×2): qty 0.96
  Filled 2014-05-02: qty 2
  Filled 2014-05-02 (×4): qty 0.96
  Filled 2014-05-02: qty 2
  Filled 2014-05-02: qty 0.96

## 2014-05-02 MED ORDER — IPRATROPIUM-ALBUTEROL 0.5-2.5 (3) MG/3ML IN SOLN
3.0000 mL | Freq: Four times a day (QID) | RESPIRATORY_TRACT | Status: DC
  Administered 2014-05-02: 3 mL via RESPIRATORY_TRACT
  Filled 2014-05-02: qty 3

## 2014-05-02 MED ORDER — HEPARIN SODIUM (PORCINE) 5000 UNIT/ML IJ SOLN
5000.0000 [IU] | Freq: Three times a day (TID) | INTRAMUSCULAR | Status: DC
  Administered 2014-05-02 – 2014-05-04 (×5): 5000 [IU] via SUBCUTANEOUS
  Filled 2014-05-02 (×7): qty 1

## 2014-05-02 MED ORDER — IOHEXOL 350 MG/ML SOLN
100.0000 mL | Freq: Once | INTRAVENOUS | Status: AC | PRN
  Administered 2014-05-02: 100 mL via INTRAVENOUS

## 2014-05-02 MED ORDER — IPRATROPIUM BROMIDE 0.02 % IN SOLN
1.0000 mg | Freq: Once | RESPIRATORY_TRACT | Status: AC
  Administered 2014-05-02: 1 mg via RESPIRATORY_TRACT
  Filled 2014-05-02: qty 5

## 2014-05-02 MED ORDER — VITAMIN B-1 100 MG PO TABS
100.0000 mg | ORAL_TABLET | Freq: Every day | ORAL | Status: DC
  Administered 2014-05-02 – 2014-05-04 (×3): 100 mg via ORAL
  Filled 2014-05-02 (×3): qty 1

## 2014-05-02 MED ORDER — DEXTROSE 5 % IV SOLN
1.0000 g | INTRAVENOUS | Status: DC
  Administered 2014-05-02 – 2014-05-03 (×2): 1 g via INTRAVENOUS
  Filled 2014-05-02 (×3): qty 10

## 2014-05-02 MED ORDER — FAMOTIDINE IN NACL 20-0.9 MG/50ML-% IV SOLN
20.0000 mg | Freq: Once | INTRAVENOUS | Status: AC
  Administered 2014-05-02: 20 mg via INTRAVENOUS
  Filled 2014-05-02: qty 50

## 2014-05-02 MED ORDER — SODIUM CHLORIDE 0.9 % IJ SOLN
3.0000 mL | Freq: Two times a day (BID) | INTRAMUSCULAR | Status: DC
  Administered 2014-05-02 – 2014-05-03 (×3): 3 mL via INTRAVENOUS

## 2014-05-02 MED ORDER — SODIUM CHLORIDE 0.9 % IV BOLUS (SEPSIS)
1000.0000 mL | Freq: Once | INTRAVENOUS | Status: AC
  Administered 2014-05-02: 1000 mL via INTRAVENOUS

## 2014-05-02 MED ORDER — LORAZEPAM 2 MG/ML IJ SOLN
0.5000 mg | Freq: Once | INTRAMUSCULAR | Status: AC
  Administered 2014-05-02: 0.5 mg via INTRAVENOUS
  Filled 2014-05-02: qty 1

## 2014-05-02 MED ORDER — DIPHENHYDRAMINE HCL 50 MG/ML IJ SOLN
25.0000 mg | Freq: Once | INTRAMUSCULAR | Status: AC
  Administered 2014-05-02: 25 mg via INTRAVENOUS
  Filled 2014-05-02: qty 1

## 2014-05-02 MED ORDER — ALBUTEROL (5 MG/ML) CONTINUOUS INHALATION SOLN
10.0000 mg/h | INHALATION_SOLUTION | RESPIRATORY_TRACT | Status: DC
  Administered 2014-05-02: 10 mg/h via RESPIRATORY_TRACT
  Filled 2014-05-02: qty 20

## 2014-05-02 MED ORDER — ONDANSETRON HCL 4 MG PO TABS: 4.0000 mg | ORAL_TABLET | Freq: Four times a day (QID) | ORAL | Status: DC | PRN

## 2014-05-02 MED ORDER — ACETAMINOPHEN 325 MG PO TABS: 650.0000 mg | ORAL_TABLET | Freq: Four times a day (QID) | ORAL | Status: DC | PRN

## 2014-05-02 MED ORDER — FLUTICASONE FUROATE-VILANTEROL 100-25 MCG/INH IN AEPB: 1.0000 | INHALATION_SPRAY | Freq: Every day | RESPIRATORY_TRACT | Status: DC

## 2014-05-02 MED ORDER — ONDANSETRON HCL 4 MG/2ML IJ SOLN
4.0000 mg | Freq: Four times a day (QID) | INTRAMUSCULAR | Status: DC | PRN
  Administered 2014-05-03: 4 mg via INTRAVENOUS
  Filled 2014-05-02: qty 2

## 2014-05-02 MED ORDER — LORAZEPAM 2 MG/ML IJ SOLN: 1.0000 mg | Freq: Once | INTRAMUSCULAR | Status: DC

## 2014-05-02 MED ORDER — ASPIRIN EC 81 MG PO TBEC
81.0000 mg | DELAYED_RELEASE_TABLET | Freq: Every day | ORAL | Status: DC
  Administered 2014-05-02 – 2014-05-04 (×3): 81 mg via ORAL
  Filled 2014-05-02 (×3): qty 1

## 2014-05-02 MED ORDER — ACETAMINOPHEN 650 MG RE SUPP: 650.0000 mg | Freq: Four times a day (QID) | RECTAL | Status: DC | PRN

## 2014-05-02 MED ORDER — HYDROCOD POLST-CHLORPHEN POLST 10-8 MG/5ML PO LQCR
5.0000 mL | Freq: Two times a day (BID) | ORAL | Status: DC
  Administered 2014-05-02 – 2014-05-04 (×4): 5 mL via ORAL
  Filled 2014-05-02 (×4): qty 5

## 2014-05-02 MED ORDER — CETYLPYRIDINIUM CHLORIDE 0.05 % MT LIQD
7.0000 mL | Freq: Two times a day (BID) | OROMUCOSAL | Status: DC
  Administered 2014-05-03 – 2014-05-04 (×3): 7 mL via OROMUCOSAL

## 2014-05-02 MED ORDER — METHYLPREDNISOLONE SODIUM SUCC 125 MG IJ SOLR
125.0000 mg | Freq: Once | INTRAMUSCULAR | Status: AC
  Administered 2014-05-02: 125 mg via INTRAVENOUS
  Filled 2014-05-02: qty 2

## 2014-05-02 MED ORDER — DEXTROSE 5 % IV SOLN
500.0000 mg | INTRAVENOUS | Status: DC
  Administered 2014-05-03 (×2): 500 mg via INTRAVENOUS
  Filled 2014-05-02 (×3): qty 500

## 2014-05-02 MED ORDER — FOLIC ACID 1 MG PO TABS
1.0000 mg | ORAL_TABLET | Freq: Every day | ORAL | Status: DC
  Administered 2014-05-02 – 2014-05-04 (×3): 1 mg via ORAL
  Filled 2014-05-02 (×3): qty 1

## 2014-05-02 MED ORDER — IPRATROPIUM-ALBUTEROL 0.5-2.5 (3) MG/3ML IN SOLN
3.0000 mL | Freq: Four times a day (QID) | RESPIRATORY_TRACT | Status: DC
  Administered 2014-05-03 – 2014-05-04 (×6): 3 mL via RESPIRATORY_TRACT
  Filled 2014-05-02 (×6): qty 3

## 2014-05-02 MED ORDER — OXYCODONE HCL 5 MG PO TABS
5.0000 mg | ORAL_TABLET | ORAL | Status: DC | PRN
  Administered 2014-05-03 – 2014-05-04 (×2): 5 mg via ORAL
  Filled 2014-05-02 (×2): qty 1

## 2014-05-02 MED ORDER — ALBUTEROL SULFATE (2.5 MG/3ML) 0.083% IN NEBU: 2.5000 mg | INHALATION_SOLUTION | Freq: Four times a day (QID) | RESPIRATORY_TRACT | Status: DC | PRN

## 2014-05-02 MED ORDER — ADULT MULTIVITAMIN W/MINERALS CH
1.0000 | ORAL_TABLET | Freq: Every day | ORAL | Status: DC
  Administered 2014-05-02 – 2014-05-04 (×3): 1 via ORAL
  Filled 2014-05-02 (×3): qty 1

## 2014-05-02 MED ORDER — SORBITOL 70 % SOLN
30.0000 mL | Freq: Every day | Status: DC | PRN
  Filled 2014-05-02: qty 30

## 2014-05-02 MED ORDER — SODIUM CHLORIDE 0.9 % IV SOLN
INTRAVENOUS | Status: DC
  Administered 2014-05-02 – 2014-05-03 (×2): via INTRAVENOUS

## 2014-05-02 NOTE — ED Notes (Signed)
Pt went to doctor told she had inflamed bronchioles. Pt came to ED c/o increasing SOB, oxygen saturation of 85.

## 2014-05-02 NOTE — ED Provider Notes (Signed)
CSN: [id number]     Arrival date & time 05/02/14  1639 History   First MD Initiated Contact with Patient 05/02/14 1642     Chief Complaint  Patient presents with  . Shortness of Breath     (Consider location/radiation/quality/duration/timing/severity/associated sxs/prior Treatment) The history is provided by the patient and medical records. No language interpreter was used.    [name] is a 68 y.o. female  with a hx of arthritis, kidney stones, presents to the Emergency Department complaining of gradual, persistent, progressively worsening SOB onset 2 weeks with bad cough and acutely worsened today.  Pt was seen by her PCP today at 1pm, given albuterol treatment and IM rocephin and solumedrol 40mg .  Pt reports that she felt slightly better, but after getting home things got progressively worse.  Pt reports significant chest pressure and tightness, with audible wheezing.  Associated symptoms include tachycardia.  Nothing makes it better and nothing makes it worse.  Pt denies fever, chills, abd pain, N/V/D, weakness, dizziness, syncope, dysuria, hematuria.  Pt denies smoking, hx of COPD/Asthma or lung cancer.        Past Medical History  Diagnosis Date  . Arthritis     both knees  . History of kidney stones   . Carpal tunnel syndrome, bilateral     both hands go to sleep at times from elbows down  . Right ureteral stone   . PONV (postoperative nausea and vomiting)   . Postlaminectomy syndrome   . DDD (degenerative disc disease), lumbosacral   . First degree AV block   . Renal calculi     BILATERAL   Past Surgical History  Procedure Laterality Date  . Knee arthroscopy Bilateral   . Appendectomy    . Total knee arthroplasty  04/13/2012    Procedure: TOTAL KNEE ARTHROPLASTY;  Surgeon: Gearlean Alf, MD;  Location: [facility];  Service: Orthopedics;  Laterality: Left;  . Total knee arthroplasty  08/08/2012    Procedure: TOTAL KNEE ARTHROPLASTY;  Surgeon: Gearlean Alf, MD;   Location: [facility];  Service: Orthopedics;  Laterality: Right;  . Anterior cervical decomp/discectomy fusion  03-18-2011    C3 -- C7  . Lumbar laminectomy  X2  1960'S  . Extracorporeal shock wave lithotripsy Right 05-16-2012  . Shoulder open rotator cuff repair Bilateral LEFT 01-11-2001/   RIGHT 07-21-2002  . Transthoracic echocardiogram  03-29-2011    MODERATE LVH/ EF 60-65%/ MILD MR  . Nasal septum surgery  1970'S  . Vaginal hysterectomy  1960'S  . Cholecystectomy open  1970'S  . Cystoscopy with retrograde pyelogram, ureteroscopy and stent placement N/A 02/13/2013    Procedure: CYSTOSCOPY WITH RETROGRADE PYELOGRAM, URETEROSCOPY AND LITHOTRIPSY ;  Surgeon: Claybon Jabs, MD;  Location: [facility];  Service: Urology;  Laterality: N/A;  . Holmium laser application Right 07/18/9676    Procedure: HOLMIUM LASER APPLICATION;  Surgeon: Claybon Jabs, MD;  Location: [facility];  Service: Urology;  Laterality: Right;   Family History  Problem Relation Age of Onset  . Diabetes type II Mother    History  Substance Use Topics  . Smoking status: Never Smoker   . Smokeless tobacco: Never Used  . Alcohol Use: No   OB History   Grav Para Term Preterm Abortions TAB SAB Ect Mult Living                 Review of Systems  Constitutional: Negative for fever, diaphoresis, appetite change, fatigue and unexpected  weight change.  HENT: Negative for mouth sores.   Eyes: Negative for visual disturbance.  Respiratory: Positive for cough, chest tightness, shortness of breath and wheezing.   Cardiovascular: Negative for chest pain.  Gastrointestinal: Negative for nausea, vomiting, abdominal pain, diarrhea and constipation.  Endocrine: Negative for polydipsia, polyphagia and polyuria.  Genitourinary: Negative for dysuria, urgency, frequency and hematuria.  Musculoskeletal: Negative for back pain and neck stiffness.  Skin: Negative for rash.  Allergic/Immunologic: Negative for  immunocompromised state.  Neurological: Negative for syncope, light-headedness and headaches.  Hematological: Does not bruise/bleed easily.  Psychiatric/Behavioral: Negative for sleep disturbance. The patient is not nervous/anxious.       Allergies  Review of patient's allergies indicates no known allergies.  Home Medications   Prior to Admission medications   Medication Sig Start Date End Date Taking? Authorizing Provider  albuterol (PROVENTIL HFA;VENTOLIN HFA) 108 (90 BASE) MCG/ACT inhaler Inhale 1 puff into the lungs every 6 (six) hours as needed for wheezing or shortness of breath.   Yes Historical Provider, MD  chlorpheniramine-HYDROcodone (TUSSIONEX) 10-8 MG/5ML LQCR Take 5 mLs by mouth 2 (two) times daily.   Yes Historical Provider, MD  Fluticasone Furoate-Vilanterol (BREO ELLIPTA) 100-25 MCG/INH AEPB Inhale 1 puff into the lungs daily.   Yes Historical Provider, MD  sulfamethoxazole-trimethoprim (BACTRIM DS) 800-160 MG per tablet Take 1 tablet by mouth 2 (two) times daily. 14 days    Historical Provider, MD   BP 178/105  Pulse 115  Resp 35  SpO2 99% Physical Exam  Nursing note and vitals reviewed. Constitutional: She is oriented to person, place, and time. She appears well-developed and well-nourished. She appears distressed.  Awake, moderate respiratory distress  HENT:  Head: Normocephalic and atraumatic.  Right Ear: Tympanic membrane, external ear and ear canal normal.  Left Ear: Tympanic membrane, external ear and ear canal normal.  Nose: Nose normal. No epistaxis. Right sinus exhibits no maxillary sinus tenderness and no frontal sinus tenderness. Left sinus exhibits no maxillary sinus tenderness and no frontal sinus tenderness.  Mouth/Throat: Uvula is midline, oropharynx is clear and moist and mucous membranes are normal. Mucous membranes are not pale and not cyanotic. No oropharyngeal exudate, posterior oropharyngeal edema, posterior oropharyngeal erythema or tonsillar  abscesses.  Eyes: Conjunctivae are normal. Pupils are equal, round, and reactive to light. No scleral icterus.  Neck: Normal range of motion and full passive range of motion without pain. Neck supple.  Cardiovascular: Regular rhythm, normal heart sounds and intact distal pulses.   No murmur heard. Tachycardic  Pulmonary/Chest: Accessory muscle usage present. No stridor. Tachypnea noted. She is in respiratory distress ( moderate). She has decreased breath sounds. She has wheezes (throughout, worse on the right).  Equal chest expansion  Abdominal: Soft. Bowel sounds are normal. She exhibits no mass. There is no tenderness. There is no rebound and no guarding.  Musculoskeletal: Normal range of motion. She exhibits no edema.  Lymphadenopathy:    She has no cervical adenopathy.  Neurological: She is alert and oriented to person, place, and time.  Speech is clear and goal oriented Moves extremities without ataxia  Skin: Skin is warm and dry. No rash noted. She is not diaphoretic. No erythema.  No rash, hives or urticaria  Psychiatric: Her mood appears anxious.  Pt very anxious about her inability to breathe    ED Course  Procedures (including critical care time) Labs Review Labs Reviewed  CBC - Abnormal; Notable for the following:    WBC 14.4 (*)  Platelets 423 (*)    All other components within normal limits  BASIC METABOLIC PANEL - Abnormal; Notable for the following:    Glucose, Bld 118 (*)    GFR calc non Af Amer 68 (*)    GFR calc Af Amer 79 (*)    Anion gap 19 (*)    All other components within normal limits  PRO B NATRIURETIC PEPTIDE  BLOOD GAS, ARTERIAL  I-STAT TROPOININ, ED    Imaging Review Dg Chest Port 1 View  05/02/2014   CLINICAL DATA:  Increasing shortness of breath with hypoxia; recent visit to primary physician with diagnosis of bronchitis  EXAM: PORTABLE CHEST - 1 VIEW  COMPARISON:  Portable chest x-ray of March 28, 2011  FINDINGS: The lungs are adequately  inflated. There is no focal infiltrate. The interstitial markings are coarse but stable. The heart and pulmonary vascularity are normal. There is no pleural effusion or pneumothorax. The observed bony thorax exhibits no acute abnormalities.  IMPRESSION: There is no evidence of pneumonia nor CHF. One cannot exclude acute bronchitis in the appropriate clinical setting.   Electronically Signed   By: David  Martinique   On: 05/02/2014 17:17     EKG Interpretation   Date/Time:  Wednesday May 02 2014 16:51:37 EDT Ventricular Rate:  106 PR Interval:  188 QRS Duration: 107 QT Interval:  348 QTC Calculation: 462 R Axis:   91 Text Interpretation:  Sinus tachycardia Left posterior fascicular block  Borderline low voltage, extremity leads Minimal ST depression, lateral  leads Baseline wander in lead(s) I II aVR aVF Since last tracing rate  faster Confirmed by YAO  MD, DAVID ([id number]) on 05/02/2014 5:13:33 PM      MDM   Final diagnoses:  SOB (shortness of breath)  Hypoxia  Wheezing   KALEIA LONGHI presents with significant wheezing, tachypnea and tachycardia.  Pt found to be hypoxic to 84% on room air upon arrival.  Pt without Hx of asthma or COPD.  Will give hour long nebulizer and solumedrol.  Pt reports being given Rocephin IM at her PCP office approx 2 hours prior to arrival here in the ED.  Pt without known hx of medication allergies, no hives or swelling but will also give benadryl and Pepcid to treat for possible allergic reaction as her breathing worsened rapidly and significantly after the administration of the medication.     6:34 PM Pt becomes SOB and tachypenic when taken off the oxygen, dropping her oxygen saturations into the low 90s.  She remains tachycardic with persistent wheezing.    The patient was discussed with and seen by Dr. Darl Householder who agrees with the treatment plan.  6:55 PM Discussed with Dr. Chancy Milroy who will admit to tele.    BP 178/105  Pulse 115  Resp 35  SpO2  99%   Abigail Butts, PA-C 05/02/14 1855

## 2014-05-02 NOTE — ED Notes (Signed)
PA, Jarrett Soho made aware of patients elevated heart rate 141.RN,Megan at bedside.

## 2014-05-02 NOTE — H&P (Signed)
[facility] History and Physical  [name] UXL:[id number] DOB: [birth year] DOA: 05/02/2014  Referring physician: Theodoro Grist PA PCP: Leonard Downing, MD   Chief Complaint: Shortness of Breath  HPI: [name] is a 68 y.o. female presents with increased Shortness of breath cough and wheeze. 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 She states that she has been noting increased cough and congestion. She went to her PCP and was given a shot of steroids as well as given an antibiotic. She has not improved and so therefore came in today. She is visibly in distress and was noted to be tachycardic. She has no chest pain noted. She states that she has not travelled anywhere recently.   Review of Systems:  Constitutional:  No weight loss, night sweats, ++Fevers, chills, ++fatigue.  HEENT:  No headaches, Difficulty swallowing,Tooth/dental problems  Cardio-vascular:  No chest pain, Orthopnea, PND, swelling in lower extremities GI:  No heartburn, indigestion, abdominal pain, ++nausea, vomiting, no diarrhea  Resp:  ++shortness of breath at rest. ++cough, No coughing up of blood.++wheezing  Skin:  no rash or lesions.  GU:   No flank pain. ++stress incontinence Musculoskeletal:  No joint pain or swelling. No back pain.  Psych:  No change in mood or affect. No memory loss.   Past Medical History  Diagnosis Date  . Arthritis     both knees  . History of kidney stones   . Carpal tunnel syndrome, bilateral     both hands go to sleep at times from elbows down  . Right ureteral stone   . PONV (postoperative nausea and vomiting)   . Postlaminectomy syndrome   . DDD (degenerative disc disease), lumbosacral   . First degree AV block   . Renal calculi     BILATERAL   Past Surgical History  Procedure Laterality Date  . Knee arthroscopy Bilateral   . Appendectomy    . Total knee  arthroplasty  04/13/2012    Procedure: TOTAL KNEE ARTHROPLASTY;  Surgeon: Gearlean Alf, MD;  Location: [facility];  Service: Orthopedics;  Laterality: Left;  . Total knee arthroplasty  08/08/2012    Procedure: TOTAL KNEE ARTHROPLASTY;  Surgeon: Gearlean Alf, MD;  Location: [facility];  Service: Orthopedics;  Laterality: Right;  . Anterior cervical decomp/discectomy fusion  03-18-2011    C3 -- C7  . Lumbar laminectomy  X2  1960'S  . Extracorporeal shock wave lithotripsy Right 05-16-2012  . Shoulder open rotator cuff repair Bilateral LEFT 01-11-2001/   RIGHT 07-21-2002  . Transthoracic echocardiogram  03-29-2011    MODERATE LVH/ EF 60-65%/ MILD MR  . Nasal septum surgery  1970'S  . Vaginal hysterectomy  1960'S  . Cholecystectomy open  1970'S  . Cystoscopy with retrograde pyelogram, ureteroscopy and stent placement N/A 02/13/2013    Procedure: CYSTOSCOPY WITH RETROGRADE PYELOGRAM, URETEROSCOPY AND LITHOTRIPSY ;  Surgeon: Claybon Jabs, MD;  Location: [facility];  Service: Urology;  Laterality: N/A;  . Holmium laser application Right 11/13/6642    Procedure: HOLMIUM LASER APPLICATION;  Surgeon: Claybon Jabs, MD;  Location: [facility];  Service: Urology;  Laterality: Right;   Social History:  reports that she has never smoked. She has never used smokeless tobacco. She reports that she does not drink alcohol or use illicit drugs.  No Known Allergies  Family History  Problem Relation Age of Onset  . Diabetes type II  Mother      Prior to Admission medications   Medication Sig Start Date End Date Taking? Authorizing Provider  albuterol (PROVENTIL HFA;VENTOLIN HFA) 108 (90 BASE) MCG/ACT inhaler Inhale 1 puff into the lungs every 6 (six) hours as needed for wheezing or shortness of breath.   Yes Historical Provider, MD  chlorpheniramine-HYDROcodone (TUSSIONEX) 10-8 MG/5ML LQCR Take 5 mLs by mouth 2 (two) times daily.   Yes Historical Provider, MD  Fluticasone  Furoate-Vilanterol (BREO ELLIPTA) 100-25 MCG/INH AEPB Inhale 1 puff into the lungs daily.   Yes Historical Provider, MD  sulfamethoxazole-trimethoprim (BACTRIM DS) 800-160 MG per tablet Take 1 tablet by mouth 2 (two) times daily. 14 days    Historical Provider, MD   Physical Exam: Filed Vitals:   05/02/14 1642 05/02/14 1643 05/02/14 1700  BP: 178/105    Pulse: 115    TempSrc: Oral    Resp: 35    SpO2: 85% 98% 99%    Wt Readings from Last 3 Encounters:  02/13/13 102.059 kg (225 lb)  02/13/13 102.059 kg (225 lb)  08/08/12 101.152 kg (223 lb)    General:  Appears anxious and comfortable Eyes: PERRL, normal lids, irises & conjunctiva ENT: grossly normal hearing, lips & tongue Neck: no LAD, masses or thyromegaly Cardiovascular: RRR, no m/r/g. No LE edema. Telemetry: Sinus Tachycardia Respiratory: ++ronchi bilaterally but good air entry Abdomen: soft, ntnd Skin: no rash or induration seen on limited exam Musculoskeletal: grossly normal tone BUE/BLE Psychiatric: grossly normal mood and affect, speech fluent and appropriate Neurologic: grossly non-focal.          Labs on Admission:  Basic Metabolic Panel:  Recent Labs Lab 05/02/14 1657  NA 142  K 3.8  CL 102  CO2 21  GLUCOSE 118*  BUN 21  CREATININE 0.85  CALCIUM 9.4   Liver Function Tests: No results found for this basename: AST, ALT, ALKPHOS, BILITOT, PROT, ALBUMIN,  in the last 168 hours No results found for this basename: LIPASE, AMYLASE,  in the last 168 hours No results found for this basename: AMMONIA,  in the last 168 hours CBC:  Recent Labs Lab 05/02/14 1657  WBC 14.4*  HGB 13.8  HCT 40.5  MCV 88.0  PLT 423*   Cardiac Enzymes: No results found for this basename: CKTOTAL, CKMB, CKMBINDEX, TROPONINI,  in the last 168 hours  BNP (last 3 results) No results found for this basename: PROBNP,  in the last 8760 hours CBG: No results found for this basename: GLUCAP,  in the last 168 hours  Radiological  Exams on Admission: Dg Chest Port 1 View  05/02/2014   CLINICAL DATA:  Increasing shortness of breath with hypoxia; recent visit to primary physician with diagnosis of bronchitis  EXAM: PORTABLE CHEST - 1 VIEW  COMPARISON:  Portable chest x-ray of March 28, 2011  FINDINGS: The lungs are adequately inflated. There is no focal infiltrate. The interstitial markings are coarse but stable. The heart and pulmonary vascularity are normal. There is no pleural effusion or pneumothorax. The observed bony thorax exhibits no acute abnormalities.  IMPRESSION: There is no evidence of pneumonia nor CHF. One cannot exclude acute bronchitis in the appropriate clinical setting.   Electronically Signed   By: David  Martinique   On: 05/02/2014 17:17    EKG: Independently reviewed. Sinus Tachy  Assessment/Plan Principal Problem:   Acute respiratory failure Active Problems:   Shortness of breath   1. Acute respiratory failure/Shortness of Breath -she has no prior history of asthma  or COPD -she will be admitted to telemetry -will get an ABG now -will start on Rocephin and zithromax -will also get a CT scan of the chest due to tachycardia and SOB -Have started on Solumedrol Q8H -Aggressive bronchodilators    Code Status: Full Code (must indicate code status--if unknown or must be presumed, indicate so) DVT Prophylaxis:Heparin Family Communication: Husband (indicate person spoken with, if applicable, with phone number if by telephone) Disposition Plan: Home (indicate anticipated LOS)  Time spent: 42min  Jonus Coble A [facility] Pager [phone]

## 2014-05-03 DIAGNOSIS — J9601 Acute respiratory failure with hypoxia: Secondary | ICD-10-CM

## 2014-05-03 DIAGNOSIS — J209 Acute bronchitis, unspecified: Secondary | ICD-10-CM | POA: Diagnosis present

## 2014-05-03 DIAGNOSIS — I480 Paroxysmal atrial fibrillation: Secondary | ICD-10-CM | POA: Diagnosis present

## 2014-05-03 DIAGNOSIS — I5032 Chronic diastolic (congestive) heart failure: Secondary | ICD-10-CM | POA: Diagnosis present

## 2014-05-03 DIAGNOSIS — I4891 Unspecified atrial fibrillation: Secondary | ICD-10-CM

## 2014-05-03 LAB — MRSA PCR SCREENING: MRSA BY PCR: NEGATIVE

## 2014-05-03 LAB — CBC
HCT: 36.2 % (ref 36.0–46.0)
Hemoglobin: 11.4 g/dL — ABNORMAL LOW (ref 12.0–15.0)
MCH: 28 pg (ref 26.0–34.0)
MCHC: 31.5 g/dL (ref 30.0–36.0)
MCV: 88.9 fL (ref 78.0–100.0)
Platelets: 301 10*3/uL (ref 150–400)
RBC: 4.07 MIL/uL (ref 3.87–5.11)
RDW: 12.6 % (ref 11.5–15.5)
WBC: 12.9 10*3/uL — AB (ref 4.0–10.5)

## 2014-05-03 LAB — COMPREHENSIVE METABOLIC PANEL
ALBUMIN: 3.5 g/dL (ref 3.5–5.2)
ALT: 15 U/L (ref 0–35)
AST: 12 U/L (ref 0–37)
Alkaline Phosphatase: 81 U/L (ref 39–117)
Anion gap: 16 — ABNORMAL HIGH (ref 5–15)
BILIRUBIN TOTAL: 0.3 mg/dL (ref 0.3–1.2)
BUN: 18 mg/dL (ref 6–23)
CHLORIDE: 105 meq/L (ref 96–112)
CO2: 19 mEq/L (ref 19–32)
Calcium: 8.6 mg/dL (ref 8.4–10.5)
Creatinine, Ser: 0.79 mg/dL (ref 0.50–1.10)
GFR calc Af Amer: >90 mL/min (ref >90)
GFR calc non Af Amer: 83 mL/min — ABNORMAL LOW (ref >90)
Glucose, Bld: 312 mg/dL — ABNORMAL HIGH (ref 70–99)
POTASSIUM: 4.2 meq/L (ref 3.7–5.3)
Sodium: 140 mEq/L (ref 137–147)
TOTAL PROTEIN: 7 g/dL (ref 6.0–8.3)

## 2014-05-03 LAB — GLUCOSE, CAPILLARY
Glucose-Capillary: 217 mg/dL — ABNORMAL HIGH (ref 70–99)
Glucose-Capillary: 246 mg/dL — ABNORMAL HIGH (ref 70–99)
Glucose-Capillary: 231 mg/dL — ABNORMAL HIGH (ref 70–99)
Glucose-Capillary: 280 mg/dL — ABNORMAL HIGH (ref 70–99)

## 2014-05-03 LAB — LEGIONELLA ANTIGEN, URINE

## 2014-05-03 LAB — INFLUENZA PANEL BY PCR (TYPE A & B)
H1N1FLUPCR: NOT DETECTED
INFLAPCR: NEGATIVE
Influenza B By PCR: NEGATIVE

## 2014-05-03 LAB — HEMOGLOBIN A1C
Hgb A1c MFr Bld: 6.1 % — ABNORMAL HIGH (ref <5.7)
MEAN PLASMA GLUCOSE: 128 mg/dL — AB (ref <117)

## 2014-05-03 LAB — TROPONIN I: Troponin I: <0.3 ng/mL (ref <0.30)

## 2014-05-03 LAB — TSH: TSH: 0.71 u[IU]/mL (ref 0.350–4.500)

## 2014-05-03 LAB — HIV ANTIBODY (ROUTINE TESTING W REFLEX): HIV 1&2 Ab, 4th Generation: NONREACTIVE

## 2014-05-03 LAB — STREP PNEUMONIAE URINARY ANTIGEN: Strep Pneumo Urinary Antigen: NEGATIVE

## 2014-05-03 MED ORDER — DILTIAZEM HCL 100 MG IV SOLR
5.0000 mg/h | INTRAVENOUS | Status: DC
  Administered 2014-05-03: 5 mg/h via INTRAVENOUS
  Filled 2014-05-03: qty 100

## 2014-05-03 MED ORDER — DILTIAZEM HCL 30 MG PO TABS
30.0000 mg | ORAL_TABLET | Freq: Four times a day (QID) | ORAL | Status: DC
  Administered 2014-05-03 – 2014-05-04 (×5): 30 mg via ORAL
  Filled 2014-05-03 (×8): qty 1

## 2014-05-03 MED ORDER — INSULIN ASPART 100 UNIT/ML ~~LOC~~ SOLN
0.0000 [IU] | Freq: Three times a day (TID) | SUBCUTANEOUS | Status: DC
  Administered 2014-05-03: 5 [IU] via SUBCUTANEOUS
  Administered 2014-05-03: 18:00:00 via SUBCUTANEOUS
  Administered 2014-05-04: 2 [IU] via SUBCUTANEOUS

## 2014-05-03 MED ORDER — INFLUENZA VAC SPLIT QUAD 0.5 ML IM SUSY
0.5000 mL | PREFILLED_SYRINGE | INTRAMUSCULAR | Status: AC
  Administered 2014-05-04: 0.5 mL via INTRAMUSCULAR
  Filled 2014-05-03 (×2): qty 0.5

## 2014-05-03 MED ORDER — DILTIAZEM LOAD VIA INFUSION
15.0000 mg | Freq: Once | INTRAVENOUS | Status: DC
  Filled 2014-05-03: qty 15

## 2014-05-03 NOTE — Progress Notes (Signed)
Inpatient Diabetes Program Recommendations  AACE/ADA: New Consensus Statement on Inpatient Glycemic Control (2013)  Target Ranges:  Prepandial:   less than 140 mg/dL      Peak postprandial:   less than 180 mg/dL (1-2 hours)      Critically ill patients:  140 - 180 mg/dL     Results for UNIQUA, KIHN (MRN [id number]) as of 05/03/2014 08:14  Ref. Range 05/02/2014 16:57 05/03/2014 03:28  Glucose Latest Range: 70-99 mg/dL 118 (H) 312 (H)    Results for ZANARIA, MORELL (MRN [id number]) as of 05/03/2014 08:14  Ref. Range 05/02/2014 22:05  Hemoglobin A1C Latest Range: <5.7 % 6.1 (H)     Patient admitted with Acute Respiratory Failure.  No History of DM.  A1c that was drawn yesterday shows WNL.  Patient started on IV steroids yesterday around 5pm.  Note AM lab glucose 312 mg/dl this AM.     MD- Please place order for CBG checks and Start Novolog Moderate SSI tid ac + HS while patient is getting IV steroids     Will follow Wyn Quaker RN, MSN, CDE Diabetes Coordinator Inpatient Diabetes Program Team Pager: [phone] (8a-10p)

## 2014-05-03 NOTE — Progress Notes (Signed)
pts HR went up into 130s-140s, tele ringing out Afib. EKG performed results paged to Dr. Humphrey Rolls. Pt said "I feel like my heart is racing a little bit." Dr. Humphrey Rolls came to see pt and new orders given to transfer pt and start Cardizem drip. Orders acknowledged, paged Dr. Humphrey Rolls for clarification of orders. New orders placed and followed.  Pt VS taken, Cardizem drip started, report called to stepdown, VS checked again, and pt transferred to stepdown.

## 2014-05-03 NOTE — ED Provider Notes (Signed)
Medical screening examination/treatment/procedure(s) were conducted as a shared visit with non-physician practitioner(s) and myself.  I personally evaluated the patient during the encounter.   EKG Interpretation   Date/Time:  Wednesday May 02 2014 16:51:37 EDT Ventricular Rate:  106 PR Interval:  188 QRS Duration: 107 QT Interval:  348 QTC Calculation: 462 R Axis:   91 Text Interpretation:  Sinus tachycardia Left posterior fascicular block  Borderline low voltage, extremity leads Minimal ST depression, lateral  leads Baseline wander in lead(s) I II aVR aVF Since last tracing rate  faster Confirmed by Lucine Bilski  MD, Shiro Ellerman ([id number]) on 05/02/2014 5:13:33 PM      [name] is a 68 y.o. female here with wheezing, tachypnea. Seen in PMD office today and was given solumedrol 40 mg IM and ceftriaxone. D/c home with bactrim then had worsening shortness of breath. On arrival, tachy in 150s, hypoxic to 84% on RA. Appears in mod distress with diffuse wheezing. Abdomen soft. After continuous nebs, solumedrol, still tachypneic. Not requiring bipap. I doubt PE. Labs unremarkable. Will admit.    Wandra Arthurs, MD 05/03/14 [phone]

## 2014-05-03 NOTE — Progress Notes (Signed)
[facility] PROGRESS NOTE  [name] XLK:[id number] DOB: [birth year] DOA: 05/02/2014 PCP: Leonard Downing, MD  Brief narrative 68 year old female with no significant past medical history except for multiple surgeries who presented with acute respiratory failure. Patient was having URI symptoms 2 weeks back with few days of improvement but had worsening of symptoms or with progressive dyspnea with cough and wheeze for the past 2 days. Patient admitted to telemetry but went into rapid A. fib and was transferred to step down on Cardizem drip.  Assessment/Plan:  SIRS with acute hypoxic respiratory failure Possibly in the setting of viral URI versus acute bronchitis. Flu PCR negative. Chest x-ray negative for infiltrate. CT angio negative for PE. Continue IV Solu Medrol and albuterol neb. Empiric Rocephin and azithromycin. Patient maintaining sats on room air. Supportive care with Tylenol and antitussives   New onset A. fib with RVR Unclear what triggered her symptoms. Check TSH and 2-D echo. Patient has been off Cardizem drip this morning and causes him to oral Cardizem 30 mg every 6 hours. Transferred to to telemetry  Prediabetes A1c of 6.1. It was elevated with steroid. Maintain on sliding scale insulin  Diet: Regular   Code Status: Full code Family Communication: None at bedside Disposition Plan: Home once improved   Consultants:  None  Procedures:  None  Antibiotics:  IV Rocephin and azithromycin  HPI/Subjective: Patient seen and examined this morning. It admission H&P reviewed. Reports her breathing to be better today  Objective: Filed Vitals:   05/03/14 0800  BP:   Pulse:   Temp: 97.6 F (36.4 C)  Resp:     Intake/Output Summary (Last 24 hours) at 05/03/14 1105 Last data filed at 05/03/14 1000  Gross per 24 hour  Intake    948 ml  Output    400 ml  Net    548 ml   Filed Weights   05/03/14 0156  Weight: 112.3 kg (247 lb 9.2 oz)     Exam:   General:  Elderly female in no acute distress  HEENT: No pallor, moist oral mucosa  Chest: Scattered expiratory wheeze, no crackles  Cardiovascular: S1 and S2 irregular, no murmurs rubs or gallop  Abdomen: Soft, nondistended, nontender, bowel sounds present  Musculoskeletal: Warm, no edema  Data Reviewed: Basic Metabolic Panel:  Recent Labs Lab 05/02/14 1657 05/02/14 2205 05/03/14 0328  NA 142  --  140  K 3.8  --  4.2  CL 102  --  105  CO2 21  --  19  GLUCOSE 118*  --  312*  BUN 21  --  18  CREATININE 0.85 0.86 0.79  CALCIUM 9.4  --  8.6   Liver Function Tests:  Recent Labs Lab 05/03/14 0328  AST 12  ALT 15  ALKPHOS 81  BILITOT 0.3  PROT 7.0  ALBUMIN 3.5   No results found for this basename: LIPASE, AMYLASE,  in the last 168 hours No results found for this basename: AMMONIA,  in the last 168 hours CBC:  Recent Labs Lab 05/02/14 1657 05/02/14 2205 05/03/14 0328  WBC 14.4* 15.2* 12.9*  HGB 13.8 12.1 11.4*  HCT 40.5 37.7 36.2  MCV 88.0 89.8 88.9  PLT 423* 313 301   Cardiac Enzymes:  Recent Labs Lab 05/02/14 2205 05/03/14 0328 05/03/14 0925  TROPONINI <0.30 <0.30 <0.30   BNP (last 3 results) No results found for this basename: PROBNP,  in the last 8760 hours CBG: No results found for this basename: GLUCAP,  in  the last 168 hours  Recent Results (from the past 240 hour(s))  MRSA PCR SCREENING     Status: None   Collection Time    05/03/14  1:56 AM      Result Value Ref Range Status   MRSA by PCR NEGATIVE  NEGATIVE Final   Comment:            The GeneXpert MRSA Assay (FDA     approved for NASAL specimens     only), is one component of a     comprehensive MRSA colonization     surveillance program. It is not     intended to diagnose MRSA     infection nor to guide or     monitor treatment for     MRSA infections.     Performed at [facility]     Studies: Ct Angio Chest Pe W/cm &/or Wo Cm  05/02/2014    CLINICAL DATA:  68 year old female with shortness of breath for 2 weeks, increasing today. Chest tightness. Initial encounter.  EXAM: CT ANGIOGRAPHY CHEST WITH CONTRAST  TECHNIQUE: Multidetector CT imaging of the chest was performed using the standard protocol during bolus administration of intravenous contrast. Multiplanar CT image reconstructions and MIPs were obtained to evaluate the vascular anatomy.  CONTRAST:  1108mL OMNIPAQUE IOHEXOL 350 MG/ML SOLN  COMPARISON:  Chest CTA 03/28/2011.  FINDINGS: Adequate contrast bolus timing in the pulmonary arterial tree. Mild lower lobe respiratory motion artifact. No focal filling defect identified in the pulmonary arterial tree to suggest the presence of acute pulmonary embolism.  Improved lung volumes. Major airways are patent. Minimal dependent atelectasis. 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 Right lung otherwise clear. Left lung otherwise clear. No pleural effusion.  No pericardial effusion. Negative visualized aorta. No mediastinal or hilar lymphadenopathy.  Negative thoracic inlet. Lower cervical ACDF hardware. Negative visualized liver, spleen, adrenal glands, and bowel in the upper abdomen.  No acute osseous abnormality identified. T3 vertebral body and right posterior element benign hemangioma. Postoperative changes also to the left proximal humerus.  Review of the MIP images confirms the above findings.  IMPRESSION: 1.  No evidence of acute pulmonary embolus. 2. Mild pulmonary atelectasis.  No acute findings in the chest.   Electronically Signed   By: Lars Pinks M.D.   On: 05/02/2014 19:24   Dg Chest Port 1 View  05/02/2014   CLINICAL DATA:  Increasing shortness of breath with hypoxia; recent visit to primary physician with diagnosis of bronchitis  EXAM: PORTABLE CHEST - 1 VIEW  COMPARISON:  Portable chest x-ray of March 28, 2011  FINDINGS: The lungs are adequately inflated. There is no focal infiltrate. The interstitial markings are coarse  but stable. The heart and pulmonary vascularity are normal. There is no pleural effusion or pneumothorax. The observed bony thorax exhibits no acute abnormalities.  IMPRESSION: There is no evidence of pneumonia nor CHF. One cannot exclude acute bronchitis in the appropriate clinical setting.   Electronically Signed   By: David  Martinique   On: 05/02/2014 17:17    Scheduled Meds: . antiseptic oral rinse  7 mL Mouth Rinse BID  . aspirin EC  81 mg Oral Daily  . azithromycin  500 mg Intravenous Q24H  . cefTRIAXone (ROCEPHIN)  IV  1 g Intravenous Q24H  . chlorpheniramine-HYDROcodone  5 mL Oral BID  . Fluticasone Furoate-Vilanterol  1 puff Inhalation Daily  . folic acid  1 mg Oral Daily  . heparin  5,000 Units Subcutaneous  3 times per day  . [START ON 05/04/2014] Influenza vac split quadrivalent PF  0.5 mL Intramuscular Tomorrow-1000  . ipratropium-albuterol  3 mL Nebulization Q6H WA  . methylPREDNISolone (SOLU-MEDROL) injection  60 mg Intravenous 3 times per day  . multivitamin with minerals  1 tablet Oral Daily  . senna  1 tablet Oral BID  . sodium chloride  3 mL Intravenous Q12H  . thiamine  100 mg Oral Daily   Continuous Infusions: . sodium chloride 50 mL/hr at 05/02/14 2303  . diltiazem (CARDIZEM) infusion 5 mg/hr (05/03/14 0320)       Time spent: 25 minutes    Vermon Grays, [place]  [facility] Pager [phone]. If 7PM-7AM, please contact night-coverage at [email or web address], password [facility] 05/03/2014, 11:15 AM

## 2014-05-03 NOTE — Progress Notes (Signed)
  Echocardiogram 2D Echocardiogram has been performed.  [name] 05/03/2014, 1:00 PM

## 2014-05-03 NOTE — Progress Notes (Signed)
CARE MANAGEMENT NOTE 05/03/2014  Patient:  [name], [name]   Account Number:  [id number]  Date Initiated:  05/03/2014  Documentation initiated by:  DAVIS,RHONDA  Subjective/Objective Assessment:   recent visit with treatment for bronchitis to her pcp, increased sob and hypoxia and presented to the ed /patient in a.fib and transferred to sdu for iv cardizem drip. has hx of 1st degree avb     Action/Plan:   home when stable is normally ind. lives with spouse at home   Anticipated DC Date:  05/06/2014   Anticipated DC Plan:  HOME/SELF CARE  In-house referral  NA      DC Planning Services  CM consult      PAC Choice  NA   Choice offered to / List presented to:  NA   DME arranged  NA      DME agency  NA     [place] arranged  NA      [place] agency  NA   Status of service:  In process, will continue to follow Medicare Important Message given?   (If response is "NO", the following Medicare IM given date fields will be blank) Date Medicare IM given:   Medicare IM given by:   Date Additional Medicare IM given:   Additional Medicare IM given by:    Discharge Disposition:    Per UR Regulation:  Reviewed for med. necessity/level of care/duration of stay  If discussed at [place] of Stay Meetings, dates discussed:    Comments:  [id number]/Rhonda Rosana Hoes, RN, BSN, CCM Chart reviewed. Discharge needs and patient's stay to be reviewed and followed by case manager.

## 2014-05-04 DIAGNOSIS — R6510 Systemic inflammatory response syndrome (SIRS) of non-infectious origin without acute organ dysfunction: Secondary | ICD-10-CM | POA: Diagnosis present

## 2014-05-04 DIAGNOSIS — I5189 Other ill-defined heart diseases: Secondary | ICD-10-CM | POA: Diagnosis present

## 2014-05-04 DIAGNOSIS — R7303 Prediabetes: Secondary | ICD-10-CM | POA: Diagnosis present

## 2014-05-04 DIAGNOSIS — J069 Acute upper respiratory infection, unspecified: Secondary | ICD-10-CM | POA: Diagnosis not present

## 2014-05-04 DIAGNOSIS — A419 Sepsis, unspecified organism: Secondary | ICD-10-CM

## 2014-05-04 DIAGNOSIS — E669 Obesity, unspecified: Secondary | ICD-10-CM | POA: Diagnosis present

## 2014-05-04 LAB — GLUCOSE, CAPILLARY: Glucose-Capillary: 181 mg/dL — ABNORMAL HIGH (ref 70–99)

## 2014-05-04 MED ORDER — DILTIAZEM HCL ER COATED BEADS 120 MG PO CP24: 120.0000 mg | ORAL_CAPSULE | Freq: Every day | ORAL | Status: DC

## 2014-05-04 MED ORDER — HYDROCOD POLST-CHLORPHEN POLST 10-8 MG/5ML PO LQCR: 5.0000 mL | Freq: Two times a day (BID) | ORAL | Status: DC

## 2014-05-04 MED ORDER — ASPIRIN 81 MG PO TBEC: 81.0000 mg | DELAYED_RELEASE_TABLET | Freq: Every day | ORAL | Status: DC

## 2014-05-04 MED ORDER — LEVOFLOXACIN 750 MG PO TABS: 750.0000 mg | ORAL_TABLET | Freq: Every day | ORAL | Status: DC

## 2014-05-04 MED ORDER — PREDNISONE 50 MG PO TABS: ORAL_TABLET | ORAL | Status: DC

## 2014-05-04 NOTE — Discharge Instructions (Addendum)
Atrial Fibrillation Atrial fibrillation is a condition that causes your heart to beat irregularly. It may also cause your heart to beat faster than normal. Atrial fibrillation can prevent your heart from pumping blood normally. It increases your risk of stroke and heart problems. HOME CARE  Take medications as told by your doctor.  Only take medications that your doctor says are safe. Some medications can make the condition worse or happen again.  If blood thinners were prescribed by your doctor, take them exactly as told. Too much can cause bleeding. Too little and you will not have the needed protection against stroke and other problems.  Perform blood tests at home if told by your doctor.  Perform blood tests exactly as told by your doctor.  Do not drink alcohol.  Do not drink beverages with caffeine such as coffee, soda, and some teas.  Maintain a healthy weight.  Do not use diet pills unless your doctor says they are safe. They may make heart problems worse.  Follow diet instructions as told by your doctor.  Exercise regularly as told by your doctor.  Keep all follow-up appointments. GET HELP IF:  You notice a change in the speed, rhythm, or strength of your heartbeat.  You suddenly begin peeing (urinating) more often.  You get tired more easily when moving or exercising. GET HELP RIGHT AWAY IF:   You have chest or belly (abdominal) pain.  You feel sick to your stomach (nauseous).  You are short of breath.  You suddenly have swollen feet and ankles.  You feel dizzy.  You face, arms, or legs feel numb or weak.  There is a change in your vision or speech. MAKE SURE YOU:   Understand these instructions.  Will watch your condition.  Will get help right away if you are not doing well or get worse. Document Released: 04/07/2008 Document Revised: 11/13/2013 Document Reviewed: 08/09/2012 [facility] Patient Information 2015 [place], [place]. This information is not  intended to replace advice given to you by your health care provider. Make sure you discuss any questions you have with your health care provider.  Diabetes Mellitus and Food It is important for you to manage your blood sugar (glucose) level. Your blood glucose level can be greatly affected by what you eat. Eating healthier foods in the appropriate amounts throughout the day at about the same time each day will help you control your blood glucose level. It can also help slow or prevent worsening of your diabetes mellitus. Healthy eating may even help you improve the level of your blood pressure and reach or maintain a healthy weight.  HOW CAN FOOD AFFECT ME? Carbohydrates Carbohydrates affect your blood glucose level more than any other type of food. Your dietitian will help you determine how many carbohydrates to eat at each meal and teach you how to count carbohydrates. Counting carbohydrates is important to keep your blood glucose at a healthy level, especially if you are using insulin or taking certain medicines for diabetes mellitus. Alcohol Alcohol can cause sudden decreases in blood glucose (hypoglycemia), especially if you use insulin or take certain medicines for diabetes mellitus. Hypoglycemia can be a life-threatening condition. Symptoms of hypoglycemia (sleepiness, dizziness, and disorientation) are similar to symptoms of having too much alcohol.  If your health care provider has given you approval to drink alcohol, do so in moderation and use the following guidelines:  Women should not have more than one drink per day, and men should not have more than two  drinks per day. One drink is equal to:  12 oz of beer.  5 oz of wine.  1 oz of hard liquor.  Do not drink on an empty stomach.  Keep yourself hydrated. Have water, diet soda, or unsweetened iced tea.  Regular soda, juice, and other mixers might contain a lot of carbohydrates and should be counted. WHAT FOODS ARE NOT  RECOMMENDED? As you make food choices, it is important to remember that all foods are not the same. Some foods have fewer nutrients per serving than other foods, even though they might have the same number of calories or carbohydrates. It is difficult to get your body what it needs when you eat foods with fewer nutrients. Examples of foods that you should avoid that are high in calories and carbohydrates but low in nutrients include:  Trans fats (most processed foods list trans fats on the Nutrition Facts label).  Regular soda.  Juice.  Candy.  Sweets, such as cake, pie, doughnuts, and cookies.  Fried foods. WHAT FOODS CAN I EAT? Have nutrient-rich foods, which will nourish your body and keep you healthy. The food you should eat also will depend on several factors, including:  The calories you need.  The medicines you take.  Your weight.  Your blood glucose level.  Your blood pressure level.  Your cholesterol level. You also should eat a variety of foods, including:  Protein, such as meat, poultry, fish, tofu, nuts, and seeds (lean animal proteins are best).  Fruits.  Vegetables.  Dairy products, such as milk, cheese, and yogurt (low fat is best).  Breads, grains, pasta, cereal, rice, and beans.  Fats such as olive oil, trans fat-free margarine, canola oil, avocado, and olives. DOES EVERYONE WITH DIABETES MELLITUS HAVE THE SAME MEAL PLAN? Because every person with diabetes mellitus is different, there is not one meal plan that works for everyone. It is very important that you meet with a dietitian who will help you create a meal plan that is just right for you. Document Released: 03/26/2005 Document Revised: 07/04/2013 Document Reviewed: 05/26/2013 [facility] Patient Information 2015 [place], [place]. This information is not intended to replace advice given to you by your health care provider. Make sure you discuss any questions you have with your health care provider.

## 2014-05-04 NOTE — Clinical Documentation Improvement (Signed)
Presents with Acute Respiratory Failure; placed on 2L oxygen supplementation; SIRS is documented.  After study, please clarify the likely etiology of the patient's Acute Respiratory Failure and document your findings in progress note and discharge summary.   Respiratory Failure secondary to:  Acute Bronchitis            AF                       Other Condition  Thank You, Zoila Shutter ,RN Clinical Documentation Specialist:  [phone]  [facility]

## 2014-05-04 NOTE — Plan of Care (Signed)
Problem: Food- and Nutrition-Related Knowledge Deficit (NB-1.1) Goal: Nutrition education Formal process to instruct or train a patient/client in a skill or to impart knowledge to help patients/clients voluntarily manage or modify food choices and eating behavior to maintain or improve health. Outcome: Completed/Met Date Met:  05/04/14  RD consulted for nutrition education regarding prediabetes/heart healthy diet and weight management    Lab Results  Component Value Date    HGBA1C 6.1* 05/02/2014    RD provided "Carbohydrate Counting for People with Diabetes" handout from the Academy of Nutrition and Dietetics. Discussed different food groups and their effects on blood sugar, emphasizing carbohydrate-containing foods. Provided list of carbohydrates and recommended serving sizes of common foods.  Discussed importance of controlled and consistent carbohydrate intake throughout the day. Provided examples of ways to balance meals/snacks and encouraged intake of high-fiber, whole grain complex carbohydrates. Teach back method used. Provided pt with "MyPlate Method" Handout for assistance in portion control, and meal planning ideas  RD provided "Heart Healthy Nutrition Therapy" handout from the Academy of Nutrition and Dietetics. Reviewed patient's dietary recall. Provided examples on ways to decrease sodium and fat intake in diet. Discouraged intake of processed foods and use of salt shaker. Encouraged fresh fruits and vegetables as well as whole grain sources of carbohydrates to maximize fiber intake. Teach back method used.  RD provided "Weight Loss Tips" handout from the Academy of Nutrition and Dietetics. Emphasized the importance of serving sizes and provided examples of correct portions of common foods. Discussed importance of controlled and consistent intake throughout the day. Provided examples of ways to balance meals/snacks and encouraged intake of high-fiber, whole grain complex  carbohydrates. Emphasized the importance of hydration with calorie-free beverages and limiting sugar-sweetened beverages. Encouraged pt to discuss physical activity options with physician. Teach back method used.  Expect good compliance. Diet recall indicates pt consuming breakfast, and tends to eat out at restaurants for lunch and dinner. Typically consume fried foods, mashed potatoes and many bread products. Encouraged pt to minimize consumption of sweets and fried foods, and to consume them only sparingly. Promoted weekly goals to assist with changes. Pt's sister has DM2, and will act as good support system for assistance with diet compliance.   Body mass index is 45.27 kg/(m^2). Pt meets criteria for Morbid Obesity/Obesity III based on current BMI.  Current diet order is Carb Mod/HH, patient is consuming approximately 75% of meals at this time. Labs and medications reviewed.   No further nutrition interventions warranted at this time. RD contact information provided. If additional nutrition issues arise, please re-consult RD.  Atlee Abide MS RD LDN Clinical Dietitian [phone]

## 2014-05-04 NOTE — Discharge Summary (Addendum)
Physician Discharge Summary  Emily HAUGHN NAT:[id number] DOB: 12-20-1945 DOA: 05/02/2014  PCP: [name], MD  Admit date: 05/02/2014 Discharge date: 05/04/2014  Time spent: 35 minutes  Recommendations for Outpatient Follow-up:  1. Discharged home with outpatient PCP followup in one week  Discharge Diagnoses:  Principal Problem:   SIRS with Acute respiratory failure with hypoxia  Active Problems:   Atrial fibrillation with RVR   Shortness of breath   Acute bronchitis   Obesity   Prediabetes   Diastolic dysfunction   Discharge Condition: Fair  Diet recommendation: Part of the stress  Filed Weights   05/03/14 0156  Weight: 112.3 kg (247 lb 9.2 oz)    History of present illness:  68 year old female with no significant past medical history except for multiple surgeries who presented with acute respiratory failure. Patient was having URI symptoms 2 weeks back with few days of improvement but had worsening of symptoms or with progressive dyspnea with cough and wheeze for the past 2 days. Patient admitted to telemetry but went into rapid A. fib and was transferred to step down on Cardizem drip.   Hospital Course:  SIRS with acute hypoxic respiratory failure  Possibly in the setting of viral URI versus acute bronchitis. Flu PCR negative. Chest x-ray negative for infiltrate. CT angio negative for PE.  Given  IV Solu Medrol and albuterol neb. Empiric Rocephin and azithromycin. Blood cx negative Patient maintaining sats on room air and symptoms now resolved.  Will discharge on oral prednisone for 5 days, levaquin to complete  a 5 day course of abx and antitussives. Patient stable for discharge and will follow up with her PCP in 1 week  New onset A. fib with RVR  Possibly triggered by underlying respiratory illness . Check TSH normal . Patient placed on Cardizem drip and transferred to stepdown. Now off drip and on oral Cardizem . In sinus rhythm. EKG on admission  showed TWI in inferior leads, but  Given lack of chest pain symptoms and initial negative troponin i think she doesnot have any ischemic symptoms. 2 d echo showed normal EF with no wall motion abnormalities and grade 2 diastolic dysfn. No clinical signs of failure. Stable on telemetry  her CHADSvasc 2 score is 1 and i will place her on baby aspirin. Will discharge her on oral cardizem 120 mg daily.  Prediabetes  A1c of 6.1. fsg  elevated with steroid. Counseled on weight reduction and exercise.   Diet: diabetic  Code Status: Full code  Family Communication: friend  at bedside  Disposition Plan: Home   Consultants:  None   Procedures:  2D echo  Antibiotics:  IV Rocephin and azithromycin     Discharge Exam: Filed Vitals:   05/04/14 0556  BP: 122/56  Pulse: 59  Temp: 97.7 F (36.5 C)  Resp: 18    General: Elderly female in no acute distress  HEENT: No pallor, moist oral mucosa  Chest: clear b/l  Cardiovascular: S1 and S2 normal no murmurs rubs or gallop  Abdomen: Soft, nondistended, nontender, bowel sounds present  Musculoskeletal: Warm, no edema  Discharge Instructions You were cared for by a hospitalist during your hospital stay. If you have any questions about your discharge medications or the care you received while you were in the hospital after you are discharged, you can call the unit and asked to speak with the hospitalist on call if the hospitalist that took care of you is not available. Once you are discharged, your primary care  physician will handle any further medical issues. Please note that NO REFILLS for any discharge medications will be authorized once you are discharged, as it is imperative that you return to your primary care physician (or establish a relationship with a primary care physician if you do not have one) for your aftercare needs so that they can reassess your need for medications and monitor your lab values.   Current Discharge Medication  List    START taking these medications   Details  aspirin EC 81 MG EC tablet Take 1 tablet (81 mg total) by mouth daily. Qty: 30 tablet, Refills: 0    diltiazem (CARDIZEM CD) 120 MG 24 hr capsule Take 1 capsule (120 mg total) by mouth daily. Qty: 30 capsule, Refills: 0    levofloxacin (LEVAQUIN) 750 MG tablet Take 1 tablet (750 mg total) by mouth daily. Qty: 3 tablet, Refills: 0    predniSONE (DELTASONE) 50 MG tablet 1 TABLET DAILY FOR 5 DAYS Qty: 5 tablet, Refills: 0      CONTINUE these medications which have CHANGED   Details  chlorpheniramine-HYDROcodone (TUSSIONEX) 10-8 MG/5ML LQCR Take 5 mLs by mouth 2 (two) times daily. Qty: 120 mL, Refills: 0      CONTINUE these medications which have NOT CHANGED   Details  albuterol (PROVENTIL HFA;VENTOLIN HFA) 108 (90 BASE) MCG/ACT inhaler Inhale 1 puff into the lungs every 6 (six) hours as needed for wheezing or shortness of breath.    Fluticasone Furoate-Vilanterol (BREO ELLIPTA) 100-25 MCG/INH AEPB Inhale 1 puff into the lungs daily.      STOP taking these medications     sulfamethoxazole-trimethoprim (BACTRIM DS) 800-160 MG per tablet        No Known Allergies Follow-up Information   Follow up with [name], MD. Schedule an appointment as soon as possible for a visit in 1 week.   Specialty:  Family Medicine   Contact information:   [place] [address] [phone]        The results of significant diagnostics from this hospitalization (including imaging, microbiology, ancillary and laboratory) are listed below for reference.    Significant Diagnostic Studies: Ct Angio Chest Pe W/cm &/or Wo Cm  05/02/2014   CLINICAL DATA:  68 year old female with shortness of breath for 2 weeks, increasing today. Chest tightness. Initial encounter.  EXAM: CT ANGIOGRAPHY CHEST WITH CONTRAST  TECHNIQUE: Multidetector CT imaging of the chest was performed using the standard protocol during bolus administration of  intravenous contrast. Multiplanar CT image reconstructions and MIPs were obtained to evaluate the vascular anatomy.  CONTRAST:  185mL OMNIPAQUE IOHEXOL 350 MG/ML SOLN  COMPARISON:  Chest CTA 03/28/2011.  FINDINGS: Adequate contrast bolus timing in the pulmonary arterial tree. Mild lower lobe respiratory motion artifact. No focal filling defect identified in the pulmonary arterial tree to suggest the presence of acute pulmonary embolism.  Improved lung volumes. Major airways are patent. Minimal dependent atelectasis. 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 Right lung otherwise clear. Left lung otherwise clear. No pleural effusion.  No pericardial effusion. Negative visualized aorta. No mediastinal or hilar lymphadenopathy.  Negative thoracic inlet. Lower cervical ACDF hardware. Negative visualized liver, spleen, adrenal glands, and bowel in the upper abdomen.  No acute osseous abnormality identified. T3 vertebral body and right posterior element benign hemangioma. Postoperative changes also to the left proximal humerus.  Review of the MIP images confirms the above findings.  IMPRESSION: 1.  No evidence of acute pulmonary embolus. 2. Mild pulmonary atelectasis.  No acute findings in the chest.   Electronically Signed   By: Lars Pinks M.D.   On: 05/02/2014 19:24   Dg Chest Port 1 View  05/02/2014   CLINICAL DATA:  Increasing shortness of breath with hypoxia; recent visit to primary physician with diagnosis of bronchitis  EXAM: PORTABLE CHEST - 1 VIEW  COMPARISON:  Portable chest x-ray of March 28, 2011  FINDINGS: The lungs are adequately inflated. There is no focal infiltrate. The interstitial markings are coarse but stable. The heart and pulmonary vascularity are normal. There is no pleural effusion or pneumothorax. The observed bony thorax exhibits no acute abnormalities.  IMPRESSION: There is no evidence of pneumonia nor CHF. One cannot exclude acute bronchitis in the appropriate clinical setting.    Electronically Signed   By: David  Martinique   On: 05/02/2014 17:17    Microbiology: Recent Results (from the past 240 hour(s))  CULTURE, BLOOD (ROUTINE X 2)     Status: None   Collection Time    05/02/14  9:43 PM      Result Value Ref Range Status   Specimen Description BLOOD LEFT WRIST   Final   Special Requests BOTTLES DRAWN AEROBIC AND ANAEROBIC 5CC   Final   Culture  Setup Time     Final   Value: 05/03/2014 03:44     Performed at [facility]   Culture     Final   Value:        BLOOD CULTURE RECEIVED NO GROWTH TO DATE CULTURE WILL BE HELD FOR 5 DAYS BEFORE ISSUING A FINAL NEGATIVE REPORT     Performed at [facility]   Report Status PENDING   Incomplete  CULTURE, BLOOD (ROUTINE X 2)     Status: None   Collection Time    05/02/14 10:15 PM      Result Value Ref Range Status   Specimen Description BLOOD RIGHT HAND   Final   Special Requests BOTTLES DRAWN AEROBIC AND ANAEROBIC 5CC   Final   Culture  Setup Time     Final   Value: 05/03/2014 03:44     Performed at [facility]   Culture     Final   Value:        BLOOD CULTURE RECEIVED NO GROWTH TO DATE CULTURE WILL BE HELD FOR 5 DAYS BEFORE ISSUING A FINAL NEGATIVE REPORT     Performed at [facility]   Report Status PENDING   Incomplete  MRSA PCR SCREENING     Status: None   Collection Time    05/03/14  1:56 AM      Result Value Ref Range Status   MRSA by PCR NEGATIVE  NEGATIVE Final   Comment:            The GeneXpert MRSA Assay (FDA     approved for NASAL specimens     only), is one component of a     comprehensive MRSA colonization     surveillance program. It is not     intended to diagnose MRSA     infection nor to guide or     monitor treatment for     MRSA infections.     Performed at [place]: Basic Metabolic Panel:  Recent Labs Lab 05/02/14 1657 05/02/14 2205 05/03/14 0328  NA 142  --  140  K 3.8  --  4.2  CL 102  --  105  CO2  21  --  19  GLUCOSE  118*  --  312*  BUN 21  --  18  CREATININE 0.85 0.86 0.79  CALCIUM 9.4  --  8.6   Liver Function Tests:  Recent Labs Lab 05/03/14 0328  AST 12  ALT 15  ALKPHOS 81  BILITOT 0.3  PROT 7.0  ALBUMIN 3.5   No results found for this basename: LIPASE, AMYLASE,  in the last 168 hours No results found for this basename: AMMONIA,  in the last 168 hours CBC:  Recent Labs Lab 05/02/14 1657 05/02/14 2205 05/03/14 0328  WBC 14.4* 15.2* 12.9*  HGB 13.8 12.1 11.4*  HCT 40.5 37.7 36.2  MCV 88.0 89.8 88.9  PLT 423* 313 301   Cardiac Enzymes:  Recent Labs Lab 05/02/14 2205 05/03/14 0328 05/03/14 0925  TROPONINI <0.30 <0.30 <0.30   BNP: BNP (last 3 results) No results found for this basename: PROBNP,  in the last 8760 hours CBG:  Recent Labs Lab 05/03/14 0751 05/03/14 1222 05/03/14 1743 05/03/14 2056 05/04/14 0738  GLUCAP 217* 246* 231* 280* 181*       Signed:  Kaeden Depaz  [facility] 05/04/2014, 12:53 PM

## 2014-05-07 LAB — GLUCOSE, CAPILLARY: Glucose-Capillary: 277 mg/dL — ABNORMAL HIGH (ref 70–99)

## 2014-05-08 ENCOUNTER — Telehealth: Payer: Self-pay | Admitting: Interventional Cardiology

## 2014-05-08 NOTE — Telephone Encounter (Signed)
New problem   Pt's spouse stated when pt was discharged from hospital Dr Tamala Julian stated to call and get an appt with him. Please call pt b/c there are no new pt appt available.

## 2014-05-09 LAB — CULTURE, BLOOD (ROUTINE X 2)
CULTURE: NO GROWTH
Culture: NO GROWTH

## 2014-05-11 NOTE — Telephone Encounter (Signed)
Pt aware of appt with Dr.Smith on 11/6 @ 1:15pm

## 2014-05-11 NOTE — Telephone Encounter (Signed)
Pt aware appt scheduked wuled th

## 2014-05-21 ENCOUNTER — Ambulatory Visit (INDEPENDENT_AMBULATORY_CARE_PROVIDER_SITE_OTHER): Payer: Medicare HMO | Admitting: Interventional Cardiology

## 2014-05-21 ENCOUNTER — Encounter: Payer: Self-pay | Admitting: Interventional Cardiology

## 2014-05-21 ENCOUNTER — Encounter: Payer: Medicare Other | Admitting: Interventional Cardiology

## 2014-05-21 VITALS — BP 152/82 | HR 73 | Ht 62.0 in | Wt 243.0 lb

## 2014-05-21 DIAGNOSIS — I480 Paroxysmal atrial fibrillation: Secondary | ICD-10-CM

## 2014-05-21 DIAGNOSIS — R0683 Snoring: Secondary | ICD-10-CM

## 2014-05-21 DIAGNOSIS — R0600 Dyspnea, unspecified: Secondary | ICD-10-CM

## 2014-05-21 DIAGNOSIS — I4891 Unspecified atrial fibrillation: Secondary | ICD-10-CM

## 2014-05-21 NOTE — Progress Notes (Signed)
Patient ID: [name], female   DOB: 12/01/1945, 68 y.o.   MRN: [id number]   Date: 05/21/2014 ID: [name], DOB 11-Dec-1945, MRN [id number] PCP: [name], MD  Reason: Atrial fibrillation  ASSESSMENT;  1. Paroxysmal atrial fibrillation, acute while hospitalized, currently resolved. 2. Acute diastolic HF, resolved, resolved after resolution of atrial fib. 3. Dyspnea on exertion   PLAN:  1. BNP 2. 30 day continuous monitor 3. Sleep study 4. Clinic follow-up in 4-6 weeks 5. Discontinue diltiazem   SUBJECTIVE: [name] is a 68 y.o. female who is here in follow-up after recent hospitalization during which she was documented to have atrial fibrillation/atrial flutter. She was discharged home on diltiazem. The history in retrospect is at of tachycardia occurring intermittently even prior to her respiratory illness. They are also concerned that an echo performed in the hospital suggested diastolic heart failure. Dyspnea has continued to be an issue although is markedly improved compared to pre-hospitalization. Mild dyspnea on exertion continues. She does not know if she's having atrial fibrillation or not. In the hospital when there was concern about atrial fibrillation, she was not aware that anything out of the ordinary was going on. She has no history of neurological complaints. She denies coronary disease. No history of hypertension.   No Known Allergies  Current Outpatient Prescriptions on File Prior to Visit  Medication Sig Dispense Refill  . albuterol (PROVENTIL HFA;VENTOLIN HFA) 108 (90 BASE) MCG/ACT inhaler Inhale 1 puff into the lungs every 6 (six) hours as needed for wheezing or shortness of breath.    Marland Kitchen aspirin EC 81 MG EC tablet Take 1 tablet (81 mg total) by mouth daily. 30 tablet 0  . diltiazem (CARDIZEM CD) 120 MG 24 hr capsule Take 1 capsule (120 mg total) by mouth daily. 30 capsule 0   No current facility-administered medications on file prior to  visit.    Past Medical History  Diagnosis Date  . Arthritis     both knees  . History of kidney stones   . Carpal tunnel syndrome, bilateral     both hands go to sleep at times from elbows down  . Right ureteral stone   . PONV (postoperative nausea and vomiting)   . Postlaminectomy syndrome   . DDD (degenerative disc disease), lumbosacral   . First degree AV block   . Renal calculi     BILATERAL    Past Surgical History  Procedure Laterality Date  . Knee arthroscopy Bilateral   . Appendectomy    . Total knee arthroplasty  04/13/2012    Procedure: TOTAL KNEE ARTHROPLASTY;  Surgeon: Gearlean Alf, MD;  Location: [facility];  Service: Orthopedics;  Laterality: Left;  . Total knee arthroplasty  08/08/2012    Procedure: TOTAL KNEE ARTHROPLASTY;  Surgeon: Gearlean Alf, MD;  Location: [facility];  Service: Orthopedics;  Laterality: Right;  . Anterior cervical decomp/discectomy fusion  03-18-2011    C3 -- C7  . Lumbar laminectomy  X2  1960'S  . Extracorporeal shock wave lithotripsy Right 05-16-2012  . Shoulder open rotator cuff repair Bilateral LEFT 01-11-2001/   RIGHT 07-21-2002  . Transthoracic echocardiogram  03-29-2011    MODERATE LVH/ EF 60-65%/ MILD MR  . Nasal septum surgery  1970'S  . Vaginal hysterectomy  1960'S  . Cholecystectomy open  1970'S  . Cystoscopy with retrograde pyelogram, ureteroscopy and stent placement N/A 02/13/2013    Procedure: CYSTOSCOPY WITH RETROGRADE PYELOGRAM, URETEROSCOPY AND LITHOTRIPSY ;  Surgeon: Claybon Jabs,  MD;  Location: [facility];  Service: Urology;  Laterality: N/A;  . Holmium laser application Right 09/15/6292    Procedure: HOLMIUM LASER APPLICATION;  Surgeon: Claybon Jabs, MD;  Location: [facility];  Service: Urology;  Laterality: Right;    History   Social History  . Marital Status: Married    Spouse Name: N/A    Number of Children: N/A  . Years of Education: N/A   Occupational History  . Not on file.     Social History Main Topics  . Smoking status: Never Smoker   . Smokeless tobacco: Never Used  . Alcohol Use: No  . Drug Use: No  . Sexual Activity: Not on file   Other Topics Concern  . Not on file   Social History Narrative    Family History  Problem Relation Age of Onset  . Diabetes type II Mother     ROS: has not fainted. Denies high blood pressure, thyroid disease, alcohol, liver disease, asthma, over-the-counter sympathomimetic therapy, claudication, and other chronic illness.. Other systems negative for complaints.  OBJECTIVE: BP 152/82 mmHg  Pulse 73  Ht 5\' 2"  (1.575 m)  Wt 243 lb (110.224 kg)  BMI 44.43 kg/m2,  General: No acute distress, obese HEENT: normal without jaundice or pallor Neck: JVD flat. Carotids no bruits Chest: clear Cardiac: Murmur: absent. Gallop: S4. Rhythm: normal. Other: normal Abdomen: Bruit: absent. Pulsation: none Extremities: Edema: absent. Pulses: 2+ and symmetric Neuro: normal Psych: normal  ECG: normal sinus rhythm, first-degree AV block, and nonspecific T-wave flattening

## 2014-05-21 NOTE — Patient Instructions (Addendum)
Your physician has recommended you make the following change in your medication:  1)STOP Diltiazem Take all other medications as prescribed  Lab Today:Bnp  Your physician has recommended that you wear an event monitor. Event monitors are medical devices that record the heart's electrical activity. Doctors most often [place] these monitors to diagnose arrhythmias. Arrhythmias are problems with the speed or rhythm of the heartbeat. The monitor is a small, portable device. You can wear one while you do your normal daily activities. This is usually used to diagnose what is causing palpitations/syncope (passing out).  Your physician has recommended that you have a sleep study. This test records several body functions during sleep, including: brain activity, eye movement, oxygen and carbon dioxide blood levels, heart rate and rhythm, breathing rate and rhythm, the flow of air through your mouth and nose, snoring, body muscle movements, and chest and belly movement.   Your physician recommends that you schedule a follow-up appointment in: 6 weeks

## 2014-05-22 ENCOUNTER — Other Ambulatory Visit (INDEPENDENT_AMBULATORY_CARE_PROVIDER_SITE_OTHER): Payer: Medicare HMO

## 2014-05-22 ENCOUNTER — Encounter (INDEPENDENT_AMBULATORY_CARE_PROVIDER_SITE_OTHER): Payer: Medicare HMO

## 2014-05-22 ENCOUNTER — Encounter: Payer: Self-pay | Admitting: Radiology

## 2014-05-22 DIAGNOSIS — I480 Paroxysmal atrial fibrillation: Secondary | ICD-10-CM

## 2014-05-22 DIAGNOSIS — R0600 Dyspnea, unspecified: Secondary | ICD-10-CM

## 2014-05-22 DIAGNOSIS — I4891 Unspecified atrial fibrillation: Secondary | ICD-10-CM

## 2014-05-22 LAB — BRAIN NATRIURETIC PEPTIDE: PRO B NATRI PEPTIDE: 35 pg/mL (ref 0.0–100.0)

## 2014-05-22 NOTE — Progress Notes (Signed)
Patient ID: [name], female   DOB: Sep 20, 1945, 68 y.o.   MRN: [id number] [employer] 30 day monitor applied. EOS 06-21-14

## 2014-05-24 ENCOUNTER — Telehealth: Payer: Self-pay | Admitting: Interventional Cardiology

## 2014-05-24 ENCOUNTER — Telehealth: Payer: Self-pay

## 2014-05-24 NOTE — Telephone Encounter (Signed)
-----   Message from [name], MD sent at 05/23/2014  6:39 PM EST ----- Blood test is normal.

## 2014-05-24 NOTE — Telephone Encounter (Signed)
New Message  Pt called to cancel the sleep study for 12/6 please cancel

## 2014-05-24 NOTE — Telephone Encounter (Signed)
Pt aware of lab results.  Blood test is normal.  Pt verbalized understanding.

## 2014-06-17 ENCOUNTER — Encounter (HOSPITAL_BASED_OUTPATIENT_CLINIC_OR_DEPARTMENT_OTHER): Payer: Medicare HMO

## 2014-06-28 ENCOUNTER — Telehealth: Payer: Self-pay

## 2014-06-28 DIAGNOSIS — I480 Paroxysmal atrial fibrillation: Secondary | ICD-10-CM

## 2014-06-28 MED ORDER — APIXABAN 5 MG PO TABS: 5.0000 mg | ORAL_TABLET | Freq: Two times a day (BID) | ORAL | Status: DC

## 2014-06-28 NOTE — Telephone Encounter (Signed)
Pt aware of cardiac monitor results -Afib with heartrate up to 135bpm and as long as 4 hours  Dr.Smith recommends anti-coag therapy  Pt [place] score is 2 Pt will start Eliquis 5mg  bid. Rx sent to py pharmacy. Pt will keep f/u appt with Dr.Smith on 07/20/14 Pt is to call the office if symptoms develop/ or with concerns Pt agreeable with plan and verbalized understanding.

## 2014-06-29 ENCOUNTER — Other Ambulatory Visit: Payer: Self-pay

## 2014-06-29 ENCOUNTER — Telehealth: Payer: Self-pay

## 2014-06-29 DIAGNOSIS — I480 Paroxysmal atrial fibrillation: Secondary | ICD-10-CM

## 2014-06-29 MED ORDER — APIXABAN 5 MG PO TABS: 5.0000 mg | ORAL_TABLET | Freq: Two times a day (BID) | ORAL | Status: DC

## 2014-06-29 NOTE — Telephone Encounter (Signed)
Patient called requesting a free 30 card and I also gave her samples of eliquis. She went to get her refill and it was 400.00 dollars

## 2014-07-16 ENCOUNTER — Telehealth: Payer: Self-pay

## 2014-07-16 NOTE — Telephone Encounter (Signed)
Patient came to husband's appointment with Dr.Jordan this afternoon.While checking in her husband she was complaining of a dull headache,pain behind back of neck and back of head.Also blurred vision for the past 2 days.B/P was taken 198/100 pulse 64.Stated she has appointment with Dr.Smith 07/20/14.Spoke to [facility] Dr.Smith's CMA.She spoke to [facility], he advised patient needs to go to [facility] ER to be evaluated.  Rechecked B/P 160/96 pulse 66.Stated headache is better.Blurred vision better.Dr.Jordan advised to restart cardizem 120 mg daily.Advised to take cardizem 120 mg when gets back home this afternoon.Advised to monitor B/P.Take B/P readings to appointment with Dr.Smith 07/20/14.

## 2014-07-20 ENCOUNTER — Ambulatory Visit (INDEPENDENT_AMBULATORY_CARE_PROVIDER_SITE_OTHER): Payer: PPO | Admitting: Interventional Cardiology

## 2014-07-20 ENCOUNTER — Encounter: Payer: Self-pay | Admitting: Interventional Cardiology

## 2014-07-20 VITALS — BP 127/70 | HR 70 | Ht 62.0 in | Wt 243.0 lb

## 2014-07-20 DIAGNOSIS — I5031 Acute diastolic (congestive) heart failure: Secondary | ICD-10-CM

## 2014-07-20 DIAGNOSIS — I480 Paroxysmal atrial fibrillation: Secondary | ICD-10-CM

## 2014-07-20 DIAGNOSIS — I10 Essential (primary) hypertension: Secondary | ICD-10-CM

## 2014-07-20 MED ORDER — DILTIAZEM HCL ER COATED BEADS 240 MG PO CP24
240.0000 mg | ORAL_CAPSULE | Freq: Every day | ORAL | Status: DC
Start: 2014-07-20 — End: 2014-07-25

## 2014-07-20 NOTE — Progress Notes (Signed)
Patient ID: [name], female   DOB: [birth year], 69 y.o.   MRN: [id number]    [address], [address] [place],   [address] Phone: [phone] Fax:  [phone]  Date:  07/20/2014   ID:  [name], DOB May 24, 1946, MRN [id number]  PCP:  [name], MD   ASSESSMENT:  1. Paroxysmal atrial fibrillation 2. Chronic anticoagulation therapy, Eliquis 3. Essential hypertension, controlled   PLAN:  1. Continue diltiazem CD 240 mg daily 2. Clinical follow-up in 4 months 3. May switch to Pradaxa or Xarelto if more economical for the patient   SUBJECTIVE: [name] is a 69 y.o. female who feels well. No dyspnea. No bleeding on anticoagulation therapy. When a long discussion concerning the natural history of atrial fibrillation and the intended purpose for long-term anticoagulation and diltiazem therapy. All questions were answered.     Wt Readings from Last 3 Encounters:  07/20/14 243 lb (110.224 kg)  05/21/14 243 lb (110.224 kg)  05/03/14 247 lb 9.2 oz (112.3 kg)     Past Medical History  Diagnosis Date  . Arthritis     both knees  . History of kidney stones   . Carpal tunnel syndrome, bilateral     both hands go to sleep at times from elbows down  . Right ureteral stone   . PONV (postoperative nausea and vomiting)   . Postlaminectomy syndrome   . DDD (degenerative disc disease), lumbosacral   . First degree AV block   . Renal calculi     BILATERAL    Current Outpatient Prescriptions  Medication Sig Dispense Refill  . apixaban (ELIQUIS) 5 MG TABS tablet Take 1 tablet (5 mg total) by mouth 2 (two) times daily. 60 tablet 0  . diltiazem (CARDIZEM CD) 120 MG 24 hr capsule Take 1 capsule (120 mg total) by mouth daily. 90 capsule 3   No current facility-administered medications for this visit.    Allergies:   No Known Allergies  Social History:  The patient  reports that she has never smoked. She has never used smokeless tobacco. She  reports that she does not drink alcohol or use illicit drugs.   ROS:  Please see the history of present illness.   No bleeding. No headache or other complaints.   All other systems reviewed and negative.   OBJECTIVE: VS:  BP 127/70 mmHg  Pulse 70  Ht 5\' 2"  (1.575 m)  Wt 243 lb (110.224 kg)  BMI 44.43 kg/m2 Well nourished, well developed, in no acute distress, obese HEENT: normal Neck: JVD flat. Carotid bruit absent  Cardiac:  normal S1, S2; RRR; no murmur Lungs:  clear to auscultation bilaterally, no wheezing, rhonchi or rales Abd: soft, nontender, no hepatomegaly Ext: Edema absent. Pulses 2+ and symmetric Skin: warm and dry Neuro:  CNs 2-12 intact, no focal abnormalities noted  EKG:  Normal sinus rhythm with poor R-wave progression       Signed, Illene Labrador III, MD 07/20/2014 8:58 AM

## 2014-07-20 NOTE — Patient Instructions (Signed)
Your physician has recommended you make the following change in your medication:  1) INCREASE Diltiazem to 240mg  daily. An Rx has been sent to your pharmacy  The names of the other anti-coagulants on the market are : Pradaxa, Xarelto  Your physician wants you to follow-up in: 4 months with Dr.Smith You will receive a reminder letter in the mail two months in advance. If you don't receive a letter, please call our office to schedule the follow-up appointment.

## 2014-07-25 ENCOUNTER — Telehealth: Payer: Self-pay

## 2014-07-25 MED ORDER — DILTIAZEM HCL ER COATED BEADS 120 MG PO CP24: 120.0000 mg | ORAL_CAPSULE | Freq: Every day | ORAL | Status: DC

## 2014-07-25 NOTE — Telephone Encounter (Signed)
She should stay on 120 mg per day of diltiazem we will deal with this at the next office visit.

## 2014-07-25 NOTE — Telephone Encounter (Signed)
Pt aware Dr.Smith has spoken with pt directly

## 2014-07-25 NOTE — Telephone Encounter (Signed)
Pt Diltiazem was increase by Dr.Smith to 240mg  daily on 1/8.pt sts that she was not able to tolerate the higher dose, she was having a lot of dizzy spells. She has went back to her original dose of 120mg  qd and feels a lot better. Adv her I will fwd an update to Dr.Smith and call back with his recommendation. She verbalized understanding.

## 2014-08-10 ENCOUNTER — Telehealth: Payer: Self-pay | Admitting: *Deleted

## 2014-08-10 NOTE — Telephone Encounter (Signed)
Eliquis samples placed at the front desk for patient. 

## 2014-08-17 ENCOUNTER — Telehealth: Payer: Self-pay

## 2014-08-17 DIAGNOSIS — I480 Paroxysmal atrial fibrillation: Secondary | ICD-10-CM

## 2014-08-17 MED ORDER — APIXABAN 5 MG PO TABS: 5.0000 mg | ORAL_TABLET | Freq: Two times a day (BID) | ORAL | Status: DC

## 2014-08-17 NOTE — Telephone Encounter (Signed)
Pt aware assistance form for [employer] patient assistance for Eliquis has been completed and signed by Dr.Smith Pt rqst we mail it to the address listed on the form. Done  She reports that yesterday she had 2 episodes of sob. Pt denies any other symptoms. Each episode lasted about 1 hour and resolved on its own. She was not able to ck her bp or heartrate. She has an old albuterol inhaler that she used, but there was no relief. Adv her that if she is in and out of afib that albuterol could aggravate her sob. She is currently on anti-coag and taking diltiazem 120mg  qd. She was unable to tolerate the higher dose of 240mg  (complained of dizziness). Adv her I will fwd an update to Dr.Smith and call back with his recommendation. She agreed with plan

## 2014-09-07 ENCOUNTER — Other Ambulatory Visit: Payer: Self-pay

## 2014-09-07 ENCOUNTER — Telehealth: Payer: Self-pay

## 2014-09-07 NOTE — Telephone Encounter (Signed)
Patient called for samples of eliquis placed samples up front

## 2014-09-13 ENCOUNTER — Telehealth: Payer: Self-pay | Admitting: Interventional Cardiology

## 2014-09-13 DIAGNOSIS — I480 Paroxysmal atrial fibrillation: Secondary | ICD-10-CM

## 2014-09-13 MED ORDER — APIXABAN 5 MG PO TABS: 5.0000 mg | ORAL_TABLET | Freq: Two times a day (BID) | ORAL | Status: DC

## 2014-09-13 NOTE — Telephone Encounter (Signed)
Returned call to [employer]. They have received the form for pt assistance for Eliquis. They need a 90 day supply Rx faxed.

## 2014-09-13 NOTE — Telephone Encounter (Signed)
New Msg       Pt c/o medication issue:  1. Name of Medication: Eliqius   2. How are you currently taking this medication (dosage and times per day)? 5 mg  3. Are you having a reaction (difficulty breathing--STAT)? no  4. What is your medication issue? Charles from [facility] calling for 90 day supply of Eliquis.   Pt prescription is for 60 day supply.   Please call [phone] fax [phone]. Mon-Fri 8am-8pm EST.

## 2014-09-13 NOTE — Telephone Encounter (Signed)
Rx for 90 day supply faxed to [phone]

## 2014-10-03 ENCOUNTER — Telehealth: Payer: Self-pay | Admitting: Interventional Cardiology

## 2014-10-03 NOTE — Telephone Encounter (Signed)
New message        Pt needs receipts from last year to be approved through [facility] for medical assistance   please give pt a call   fax number [phone]

## 2014-10-03 NOTE — Telephone Encounter (Signed)
returned pt call.adv her that we do not have access to her billing info here in the office. provided the tel # to [facility].

## 2014-12-19 ENCOUNTER — Encounter: Payer: Self-pay | Admitting: *Deleted

## 2014-12-20 ENCOUNTER — Other Ambulatory Visit: Payer: Self-pay | Admitting: Neurosurgery

## 2014-12-20 DIAGNOSIS — M5136 Other intervertebral disc degeneration, lumbar region: Secondary | ICD-10-CM

## 2014-12-20 DIAGNOSIS — Z7901 Long term (current) use of anticoagulants: Secondary | ICD-10-CM | POA: Insufficient documentation

## 2014-12-20 DIAGNOSIS — I480 Paroxysmal atrial fibrillation: Secondary | ICD-10-CM | POA: Insufficient documentation

## 2014-12-21 ENCOUNTER — Encounter: Payer: Self-pay | Admitting: Interventional Cardiology

## 2014-12-21 ENCOUNTER — Ambulatory Visit (INDEPENDENT_AMBULATORY_CARE_PROVIDER_SITE_OTHER): Payer: PPO | Admitting: Interventional Cardiology

## 2014-12-21 VITALS — BP 142/76 | HR 76 | Ht 62.0 in | Wt 245.2 lb

## 2014-12-21 DIAGNOSIS — Z7901 Long term (current) use of anticoagulants: Secondary | ICD-10-CM | POA: Diagnosis not present

## 2014-12-21 DIAGNOSIS — I480 Paroxysmal atrial fibrillation: Secondary | ICD-10-CM | POA: Diagnosis not present

## 2014-12-21 DIAGNOSIS — R072 Precordial pain: Secondary | ICD-10-CM

## 2014-12-21 DIAGNOSIS — I10 Essential (primary) hypertension: Secondary | ICD-10-CM

## 2014-12-21 DIAGNOSIS — I5032 Chronic diastolic (congestive) heart failure: Secondary | ICD-10-CM | POA: Diagnosis not present

## 2014-12-21 DIAGNOSIS — R079 Chest pain, unspecified: Secondary | ICD-10-CM

## 2014-12-21 NOTE — Progress Notes (Signed)
Cardiology Office Note   Date:  12/21/2014   ID:  [name], DOB 08-24-1945, MRN [id number]  PCP:  Emily Barman, MD  Cardiologist:  Emily Grooms, MD   Chief Complaint  Patient presents with  . Chest Pain      History of Present Illness: [name] is a 69 y.o. female who presents for paroxysmal atrial fibrillation, hypertension, chronic anticoagulation therapy, and obesity.  Two day history of right chest and shoulder discomfort. Some sensation of burning substernally. Symptoms are nonexertional. She is concerned that this may be cardiac related. She denies arrhythmia.    Past Medical History  Diagnosis Date  . Arthritis     both knees  . History of kidney stones   . Carpal tunnel syndrome, bilateral     both hands go to sleep at times from elbows down  . Right ureteral stone   . PONV (postoperative nausea and vomiting)   . Postlaminectomy syndrome   . DDD (degenerative disc disease), lumbosacral   . First degree AV block   . Renal calculi     BILATERAL    Past Surgical History  Procedure Laterality Date  . Knee arthroscopy Bilateral   . Appendectomy    . Total knee arthroplasty  04/13/2012    Procedure: TOTAL KNEE ARTHROPLASTY;  Surgeon: Emily Alf, MD;  Location: [facility];  Service: Orthopedics;  Laterality: Left;  . Total knee arthroplasty  08/08/2012    Procedure: TOTAL KNEE ARTHROPLASTY;  Surgeon: Emily Alf, MD;  Location: [facility];  Service: Orthopedics;  Laterality: Right;  . Anterior cervical decomp/discectomy fusion  03-18-2011    C3 -- C7  . Lumbar laminectomy  X2  1960'S  . Extracorporeal shock wave lithotripsy Right 05-16-2012  . Shoulder open rotator cuff repair Bilateral LEFT 01-11-2001/   RIGHT 07-21-2002  . Transthoracic echocardiogram  03-29-2011    MODERATE LVH/ EF 60-65%/ MILD MR  . Nasal septum surgery  1970'S  . Vaginal hysterectomy  1960'S  . Cholecystectomy open  1970'S  . Cystoscopy with retrograde  pyelogram, ureteroscopy and stent placement N/A 02/13/2013    Procedure: CYSTOSCOPY WITH RETROGRADE PYELOGRAM, URETEROSCOPY AND LITHOTRIPSY ;  Surgeon: Emily Jabs, MD;  Location: [facility];  Service: Urology;  Laterality: N/A;  . Holmium laser application Right 0/02/6760    Procedure: HOLMIUM LASER APPLICATION;  Surgeon: Emily Jabs, MD;  Location: [facility];  Service: Urology;  Laterality: Right;     Current Outpatient Prescriptions  Medication Sig Dispense Refill  . apixaban (ELIQUIS) 5 MG TABS tablet Take 1 tablet (5 mg total) by mouth 2 (two) times daily. 180 tablet 3  . diltiazem (CARDIZEM CD) 120 MG 24 hr capsule Take 1 capsule (120 mg total) by mouth daily.     No current facility-administered medications for this visit.    Allergies:   Review of patient's allergies indicates no known allergies.    Social History:  The patient  reports that she has never smoked. She has never used smokeless tobacco. She reports that she does not drink alcohol or use illicit drugs.   Family History:  The patient's family history includes Diabetes in her brother, brother, and sister; Diabetes type II in her mother; Heart disease in her brother and brother; Hypertension in her brother, brother, and sister; Lung cancer in her father.    ROS:  Please see the history of present illness.   Otherwise, review of systems are  positive for none.   All other systems are reviewed and negative.    PHYSICAL EXAM: VS:  BP 142/76 mmHg  Pulse 76  Ht 5\' 2"  (1.575 m)  Wt 111.222 kg (245 lb 3.2 oz)  BMI 44.84 kg/m2  SpO2 96% , BMI Body mass index is 44.84 kg/(m^2). GEN: Well nourished, well developed, in no acute distress HEENT: normal Neck: no JVD, carotid bruits, or masses Cardiac: RRR; no murmurs, rubs, or gallops,no edema  Respiratory:  clear to auscultation bilaterally, normal work of breathing GI: soft, nontender, nondistended, + BS MS: no deformity or  atrophy Skin: warm and dry, no rash Neuro:  Strength and sensation are intact Psych: euthymic mood, full affect   EKG:  EKG is not ordered today.   Recent Labs: 05/02/2014: TSH 0.710 05/03/2014: ALT 15; BUN 18; Creatinine, Ser 0.79; Hemoglobin 11.4*; Platelets 301; Potassium 4.2; Sodium 140 05/22/2014: Pro B Natriuretic peptide (BNP) 35.0    Lipid Panel No results found for: CHOL, TRIG, HDL, CHOLHDL, VLDL, LDLCALC, LDLDIRECT    Wt Readings from Last 3 Encounters:  12/21/14 111.222 kg (245 lb 3.2 oz)  07/20/14 110.224 kg (243 lb)  05/21/14 110.224 kg (243 lb)      Other studies Reviewed: Additional studies/ records that were reviewed today include:   ASSESSMENT AND PLAN:  Essential hypertension- controlled  Chronic diastolic heart failure- no evidence of volume overload  Chronic anticoagulation- controlled without bleeding  Paroxysmal atrial fibrillation - clinically in sinus rhythm  Chest pain, unspecified chest pain type- possibly GI related. Rule out CAD     Current medicines are reviewed at length with the patient today.  The patient has concerns regarding medicines.  The following changes have been made:  no change.  To evaluate chest discomfort she needs a myocardial perfusion study to exclude coronary disease.  Labs/ tests ordered today include:  No orders of the defined types were placed in this encounter.     Disposition:   FU with HS in 1 year or earlier if abnormality is found on nuclear testing  Signed, Emily Grooms, MD  12/21/2014 10:25 AM    [name] [place], [place]  [address] Phone: [phone]; Fax: [phone]

## 2014-12-21 NOTE — Patient Instructions (Signed)
Medication Instructions:  Your physician recommends that you continue on your current medications as directed. Please refer to the Current Medication list given to you today.   Labwork: None   Testing/Procedures: Your physician has requested that you have a lexiscan myoview. For further information please visit [email or web address]. Please follow instruction sheet, as given.   Follow-Up: Your physician wants you to follow-up in: 1 year with Dr.Smith You will receive a reminder letter in the mail two months in advance. If you don't receive a letter, please call our office to schedule the follow-up appointment.   Any Other Special Instructions Will Be Listed Below (If Applicable).

## 2014-12-27 ENCOUNTER — Telehealth: Payer: Self-pay | Admitting: Interventional Cardiology

## 2014-12-27 NOTE — Telephone Encounter (Signed)
Okay to hold Eliquis for 5 days prior to spinal injection. Resume when safe.

## 2014-12-27 NOTE — Telephone Encounter (Signed)
Routed to Dr.Smith to advise 

## 2014-12-27 NOTE — Telephone Encounter (Signed)
Request for surgical clearance:  1. What type of surgery is being performed? Lumbar Facet Injection    2. When is this surgery scheduled? Not yet scheduled   3. Are there any medications that need to be held prior to surgery and how long?Requesing pt stop Eliquis 5 days prior   4. Name of physician performing surgery? Dr. Suella Broad   5. What is your office phone and fax number? [phone] (phone) [phone] (fax)

## 2014-12-28 NOTE — Telephone Encounter (Signed)
Follow UP   [name] with [facility] called to follow up on surgical clearance.   Please call back [phone] ext [phone].

## 2014-12-28 NOTE — Telephone Encounter (Signed)
Clearance faxed [facility] attn: Dianna. Dianna aware and appreciative of the assiatance

## 2015-01-03 ENCOUNTER — Telehealth (HOSPITAL_COMMUNITY): Payer: Self-pay | Admitting: *Deleted

## 2015-01-03 NOTE — Telephone Encounter (Signed)
Patient given detailed instructions per Myocardial Perfusion Study Information Sheet for test on 01/08/15 at 12;00. Patient Notified to arrive 15 minutes early, and that it is imperative to arrive on time for appointment to keep from having the test rescheduled. Patient verbalized understanding. Hubbard Robinson, RN

## 2015-01-04 ENCOUNTER — Inpatient Hospital Stay
Admission: RE | Admit: 2015-01-04 | Discharge: 2015-01-04 | Disposition: A | Discharge status: Home / Self Care | Payer: PPO | Source: Ambulatory Visit | Attending: Neurosurgery | Admitting: Neurosurgery

## 2015-01-04 ENCOUNTER — Other Ambulatory Visit: Payer: PPO

## 2015-01-04 NOTE — Discharge Instructions (Signed)

## 2015-01-08 ENCOUNTER — Ambulatory Visit (HOSPITAL_COMMUNITY): Payer: PPO | Attending: Interventional Cardiology

## 2015-01-08 DIAGNOSIS — R072 Precordial pain: Secondary | ICD-10-CM | POA: Insufficient documentation

## 2015-01-08 DIAGNOSIS — I10 Essential (primary) hypertension: Secondary | ICD-10-CM | POA: Insufficient documentation

## 2015-01-08 DIAGNOSIS — R9439 Abnormal result of other cardiovascular function study: Secondary | ICD-10-CM | POA: Insufficient documentation

## 2015-01-08 MED ORDER — TECHNETIUM TC 99M SESTAMIBI GENERIC - CARDIOLITE
32.7000 | Freq: Once | INTRAVENOUS | Status: AC | PRN
  Administered 2015-01-08: 32.7 via INTRAVENOUS

## 2015-01-09 ENCOUNTER — Ambulatory Visit (HOSPITAL_COMMUNITY): Payer: PPO | Attending: Interventional Cardiology

## 2015-01-09 DIAGNOSIS — R072 Precordial pain: Secondary | ICD-10-CM | POA: Insufficient documentation

## 2015-01-09 LAB — MYOCARDIAL PERFUSION IMAGING
CHL CUP NUCLEAR SDS: 11
CHL CUP NUCLEAR SSS: 12
LV dias vol: 101 mL
LV sys vol: 25 mL
NUC STRESS TID: 0.93
Peak HR: 88 {beats}/min
RATE: 0.37
Rest HR: 69 {beats}/min
SRS: 1

## 2015-01-09 MED ORDER — REGADENOSON 0.4 MG/5ML IV SOLN
0.4000 mg | Freq: Once | INTRAVENOUS | Status: AC
  Administered 2015-01-09: 0.4 mg via INTRAVENOUS

## 2015-01-09 MED ORDER — TECHNETIUM TC 99M SESTAMIBI GENERIC - CARDIOLITE
30.0000 | Freq: Once | INTRAVENOUS | Status: AC | PRN
  Administered 2015-01-09: 30 via INTRAVENOUS

## 2015-01-10 ENCOUNTER — Telehealth: Payer: Self-pay

## 2015-01-10 ENCOUNTER — Other Ambulatory Visit: Payer: Self-pay | Admitting: Interventional Cardiology

## 2015-01-10 ENCOUNTER — Other Ambulatory Visit (INDEPENDENT_AMBULATORY_CARE_PROVIDER_SITE_OTHER): Payer: PPO | Admitting: *Deleted

## 2015-01-10 DIAGNOSIS — Z01812 Encounter for preprocedural laboratory examination: Secondary | ICD-10-CM

## 2015-01-10 LAB — CBC WITH DIFFERENTIAL/PLATELET
BASOS PCT: 0.4 % (ref 0.0–3.0)
Basophils Absolute: 0 10*3/uL (ref 0.0–0.1)
EOS PCT: 3.4 % (ref 0.0–5.0)
Eosinophils Absolute: 0.3 10*3/uL (ref 0.0–0.7)
HCT: 37.6 % (ref 36.0–46.0)
Hemoglobin: 12.7 g/dL (ref 12.0–15.0)
LYMPHS PCT: 27.5 % (ref 12.0–46.0)
Lymphs Abs: 2.4 10*3/uL (ref 0.7–4.0)
MCHC: 33.8 g/dL (ref 30.0–36.0)
MCV: 87.3 fl (ref 78.0–100.0)
Monocytes Absolute: 0.6 10*3/uL (ref 0.1–1.0)
Monocytes Relative: 6.9 % (ref 3.0–12.0)
Neutro Abs: 5.4 10*3/uL (ref 1.4–7.7)
Neutrophils Relative %: 61.8 % (ref 43.0–77.0)
Platelets: 342 10*3/uL (ref 150.0–400.0)
RBC: 4.3 Mil/uL (ref 3.87–5.11)
RDW: 13 % (ref 11.5–15.5)
WBC: 8.7 10*3/uL (ref 4.0–10.5)

## 2015-01-10 LAB — BASIC METABOLIC PANEL
BUN: 19 mg/dL (ref 6–23)
CHLORIDE: 105 meq/L (ref 96–112)
CO2: 31 mEq/L (ref 19–32)
Calcium: 9 mg/dL (ref 8.4–10.5)
Creatinine, Ser: 0.8 mg/dL (ref 0.40–1.20)
GFR: 75.31 mL/min (ref >60.00)
GLUCOSE: 117 mg/dL — AB (ref 70–99)
Potassium: 3.5 mEq/L (ref 3.5–5.1)
SODIUM: 142 meq/L (ref 135–145)

## 2015-01-10 LAB — PROTIME-INR
INR: 1.2 ratio — AB (ref 0.8–1.0)
PROTHROMBIN TIME: 12.8 s (ref 9.6–13.1)

## 2015-01-10 NOTE — Telephone Encounter (Signed)
Pt aware of myoview results and Dr.Smith's recommendations. The myocardial perfusion study is characterized as high risk. She needs coronary angiography with Dr.Smith at the earliest possible opportunity, we will try for , July 1.  Pt agreeable to proceed. Adv pt I will call [facility] and call her back with date, time, and instructions. Pt verbalized understanding.   Pt instructed to note take any more Eliquis until after her procedure. Pt aware her cardiac cath is scheduled for 7/1 @ 3pm with Dr.Smith. Pt will come to the office today for pre-procedure labs. Instruction letter will be left at the front desk for pick up. Pt verbalized understanding.

## 2015-01-10 NOTE — Telephone Encounter (Signed)
Pt aware cardiac cath rescheduled with Dr.Smith for 01/16/15 @ 12:30pm. Pt is report [facility] hospital @ 10:30am. Pt aware preprocedure labs are normal. Pt instructed to hold Eliquis for 4 dose prior to cath. Adv pt all other preprocedure cath info still applies. Pt verbalized understanding.

## 2015-01-10 NOTE — Telephone Encounter (Signed)
Lmtcb. Per Dr.Smith pt cath scheduled for 7/1 should be rescheduled for 7/4 in the afternoon

## 2015-01-10 NOTE — Telephone Encounter (Signed)
-----   Message from Belva Crome, MD sent at 01/09/2015  6:33 PM EDT ----- The myocardial perfusion study is characterized as high risk. She needs coronary angiography with me at the earliest possible opportunity, I hope Friday, July 1.

## 2015-01-11 ENCOUNTER — Ambulatory Visit (HOSPITAL_COMMUNITY): Admission: RE | Admit: 2015-01-11 | Payer: PPO | Source: Ambulatory Visit | Admitting: Interventional Cardiology

## 2015-01-11 ENCOUNTER — Encounter (HOSPITAL_COMMUNITY): Admission: RE | Payer: Self-pay | Source: Ambulatory Visit

## 2015-01-11 SURGERY — LEFT HEART CATH AND CORONARY ANGIOGRAPHY

## 2015-01-16 ENCOUNTER — Ambulatory Visit (HOSPITAL_COMMUNITY)
Admission: RE | Admit: 2015-01-16 | Discharge: 2015-01-16 | Disposition: A | Discharge status: Home / Self Care | Payer: PPO | Source: Ambulatory Visit | Attending: Interventional Cardiology | Admitting: Interventional Cardiology

## 2015-01-16 ENCOUNTER — Other Ambulatory Visit: Payer: Self-pay

## 2015-01-16 ENCOUNTER — Encounter (HOSPITAL_COMMUNITY)
Admission: RE | Disposition: A | Discharge status: Home / Self Care | Payer: PPO | Source: Ambulatory Visit | Attending: Interventional Cardiology

## 2015-01-16 DIAGNOSIS — R079 Chest pain, unspecified: Secondary | ICD-10-CM | POA: Diagnosis not present

## 2015-01-16 DIAGNOSIS — Z87442 Personal history of urinary calculi: Secondary | ICD-10-CM | POA: Diagnosis not present

## 2015-01-16 DIAGNOSIS — E785 Hyperlipidemia, unspecified: Secondary | ICD-10-CM | POA: Diagnosis not present

## 2015-01-16 DIAGNOSIS — R9439 Abnormal result of other cardiovascular function study: Secondary | ICD-10-CM | POA: Diagnosis not present

## 2015-01-16 DIAGNOSIS — I480 Paroxysmal atrial fibrillation: Secondary | ICD-10-CM | POA: Diagnosis not present

## 2015-01-16 DIAGNOSIS — I10 Essential (primary) hypertension: Secondary | ICD-10-CM | POA: Insufficient documentation

## 2015-01-16 DIAGNOSIS — M961 Postlaminectomy syndrome, not elsewhere classified: Secondary | ICD-10-CM | POA: Insufficient documentation

## 2015-01-16 DIAGNOSIS — I440 Atrioventricular block, first degree: Secondary | ICD-10-CM | POA: Diagnosis not present

## 2015-01-16 DIAGNOSIS — R0789 Other chest pain: Secondary | ICD-10-CM | POA: Insufficient documentation

## 2015-01-16 DIAGNOSIS — I5032 Chronic diastolic (congestive) heart failure: Secondary | ICD-10-CM | POA: Diagnosis present

## 2015-01-16 DIAGNOSIS — Z7901 Long term (current) use of anticoagulants: Secondary | ICD-10-CM | POA: Insufficient documentation

## 2015-01-16 DIAGNOSIS — M5137 Other intervertebral disc degeneration, lumbosacral region: Secondary | ICD-10-CM | POA: Insufficient documentation

## 2015-01-16 HISTORY — PX: CARDIAC CATHETERIZATION: SHX172

## 2015-01-16 SURGERY — LEFT HEART CATH AND CORONARY ANGIOGRAPHY
Anesthesia: LOCAL

## 2015-01-16 MED ORDER — SODIUM CHLORIDE 0.9 % IJ SOLN: 3.0000 mL | INTRAMUSCULAR | Status: DC | PRN

## 2015-01-16 MED ORDER — ASPIRIN 81 MG PO CHEW
CHEWABLE_TABLET | ORAL | Status: AC
  Filled 2015-01-16: qty 1

## 2015-01-16 MED ORDER — SODIUM CHLORIDE 0.9 % IV SOLN: 250.0000 mL | INTRAVENOUS | Status: DC | PRN

## 2015-01-16 MED ORDER — FENTANYL CITRATE (PF) 100 MCG/2ML IJ SOLN
INTRAMUSCULAR | Status: AC
  Filled 2015-01-16: qty 2

## 2015-01-16 MED ORDER — ONDANSETRON HCL 4 MG/2ML IJ SOLN: 4.0000 mg | Freq: Four times a day (QID) | INTRAMUSCULAR | Status: DC | PRN

## 2015-01-16 MED ORDER — SODIUM CHLORIDE 0.9 % WEIGHT BASED INFUSION
3.0000 mL/kg/h | INTRAVENOUS | Status: DC
  Administered 2015-01-16: 3 mL/kg/h via INTRAVENOUS

## 2015-01-16 MED ORDER — SODIUM CHLORIDE 0.9 % IJ SOLN: 3.0000 mL | Freq: Two times a day (BID) | INTRAMUSCULAR | Status: DC

## 2015-01-16 MED ORDER — HEPARIN (PORCINE) IN NACL 2-0.9 UNIT/ML-% IJ SOLN
INTRAMUSCULAR | Status: AC
  Filled 2015-01-16: qty 1000

## 2015-01-16 MED ORDER — OXYCODONE-ACETAMINOPHEN 5-325 MG PO TABS: 1.0000 | ORAL_TABLET | ORAL | Status: DC | PRN

## 2015-01-16 MED ORDER — HEPARIN SODIUM (PORCINE) 1000 UNIT/ML IJ SOLN
INTRAMUSCULAR | Status: AC
  Filled 2015-01-16: qty 1

## 2015-01-16 MED ORDER — MIDAZOLAM HCL 2 MG/2ML IJ SOLN
INTRAMUSCULAR | Status: DC | PRN
  Administered 2015-01-16 (×3): 1 mg via INTRAVENOUS

## 2015-01-16 MED ORDER — SODIUM CHLORIDE 0.9 % WEIGHT BASED INFUSION: 1.0000 mL/kg/h | INTRAVENOUS | Status: DC

## 2015-01-16 MED ORDER — VERAPAMIL HCL 2.5 MG/ML IV SOLN
INTRAVENOUS | Status: AC
  Filled 2015-01-16: qty 2

## 2015-01-16 MED ORDER — IOHEXOL 350 MG/ML SOLN
INTRAVENOUS | Status: DC | PRN
  Administered 2015-01-16: 70 mL via INTRA_ARTERIAL

## 2015-01-16 MED ORDER — LIDOCAINE HCL (PF) 1 % IJ SOLN
INTRAMUSCULAR | Status: AC
  Filled 2015-01-16: qty 30

## 2015-01-16 MED ORDER — DIAZEPAM 5 MG PO TABS
5.0000 mg | ORAL_TABLET | Freq: Once | ORAL | Status: AC
Start: 2015-01-16 — End: 2015-01-16
  Administered 2015-01-16: 5 mg via ORAL
  Filled 2015-01-16: qty 1

## 2015-01-16 MED ORDER — ASPIRIN 81 MG PO CHEW
81.0000 mg | CHEWABLE_TABLET | ORAL | Status: AC
  Administered 2015-01-16: 81 mg via ORAL

## 2015-01-16 MED ORDER — DIAZEPAM 5 MG PO TABS
ORAL_TABLET | ORAL | Status: AC
  Administered 2015-01-16: 5 mg via ORAL
  Filled 2015-01-16: qty 1

## 2015-01-16 MED ORDER — MIDAZOLAM HCL 2 MG/2ML IJ SOLN
INTRAMUSCULAR | Status: AC
  Filled 2015-01-16: qty 2

## 2015-01-16 MED ORDER — VERAPAMIL HCL 2.5 MG/ML IV SOLN
INTRAVENOUS | Status: DC | PRN
  Administered 2015-01-16: 14:00:00 via INTRA_ARTERIAL

## 2015-01-16 MED ORDER — SODIUM CHLORIDE 0.9 % WEIGHT BASED INFUSION: 3.0000 mL/kg/h | INTRAVENOUS | Status: DC

## 2015-01-16 MED ORDER — HEPARIN SODIUM (PORCINE) 1000 UNIT/ML IJ SOLN
INTRAMUSCULAR | Status: DC | PRN
  Administered 2015-01-16: 5500 [IU] via INTRAVENOUS

## 2015-01-16 MED ORDER — FENTANYL CITRATE (PF) 100 MCG/2ML IJ SOLN
INTRAMUSCULAR | Status: DC | PRN
  Administered 2015-01-16: 50 ug via INTRAVENOUS

## 2015-01-16 MED ORDER — ACETAMINOPHEN 325 MG PO TABS: 650.0000 mg | ORAL_TABLET | ORAL | Status: DC | PRN

## 2015-01-16 SURGICAL SUPPLY — 11 items
CATH INFINITI 5 FR JL3.5 (CATHETERS) ×2 IMPLANT
CATH INFINITI JR4 5F (CATHETERS) ×2 IMPLANT
DEVICE RAD COMP TR BAND LRG (VASCULAR PRODUCTS) ×2 IMPLANT
GLIDESHEATH SLEND A-KIT 6F 22G (SHEATH) ×2 IMPLANT
HOVERMATT SINGLE USE (MISCELLANEOUS) ×2 IMPLANT
KIT HEART LEFT (KITS) ×2 IMPLANT
PACK CARDIAC CATHETERIZATION (CUSTOM PROCEDURE TRAY) ×2 IMPLANT
TRANSDUCER W/STOPCOCK (MISCELLANEOUS) ×2 IMPLANT
TUBING CIL FLEX 10 FLL-RA (TUBING) ×2 IMPLANT
WIRE HI TORQ VERSACORE-J 145CM (WIRE) ×2 IMPLANT
WIRE SAFE-T 1.5MM-J .035X260CM (WIRE) ×2 IMPLANT

## 2015-01-16 NOTE — H&P (Signed)
[name] is a 69 y.o. female  Admit Date: 01/16/2015 Referring Physician: Adline Mango, M.D. Primary Cardiologist:: [facility] Blenda Bridegroom, M.D. Chief complaint / reason for admission: Chest pain and high-risk myocardial perfusion study  HPI: The patient is 69 years of age. When seen due in a routine office follow-up for paroxysmal atrial fibrillation she complained of left arm and chest discomfort. The symptoms were nonexertional. She also described a sensation of burning in her chest. Because of her age and concern that she may have coronary disease, a myocardial perfusion study was performed on 01/09/2015. The test was interpreted by Dr. Meda Coffee as high risk.  In an room the patient's chest discomfort has improved. She has not had Eliquis for 48 hours.    PMH:    Past Medical History  Diagnosis Date  . Arthritis     both knees  . History of kidney stones   . Carpal tunnel syndrome, bilateral     both hands go to sleep at times from elbows down  . Right ureteral stone   . PONV (postoperative nausea and vomiting)   . Postlaminectomy syndrome   . DDD (degenerative disc disease), lumbosacral   . First degree AV block   . Renal calculi     BILATERAL    PSH:    Past Surgical History  Procedure Laterality Date  . Knee arthroscopy Bilateral   . Appendectomy    . Total knee arthroplasty  04/13/2012    Procedure: TOTAL KNEE ARTHROPLASTY;  Surgeon: Gearlean Alf, MD;  Location: [facility];  Service: Orthopedics;  Laterality: Left;  . Total knee arthroplasty  08/08/2012    Procedure: TOTAL KNEE ARTHROPLASTY;  Surgeon: Gearlean Alf, MD;  Location: [facility];  Service: Orthopedics;  Laterality: Right;  . Anterior cervical decomp/discectomy fusion  03-18-2011    C3 -- C7  . Lumbar laminectomy  X2  1960'S  . Extracorporeal shock wave lithotripsy Right 05-16-2012  . Shoulder open rotator cuff repair Bilateral LEFT 01-11-2001/   RIGHT 07-21-2002  . Transthoracic echocardiogram  03-29-2011   MODERATE LVH/ EF 60-65%/ MILD MR  . Nasal septum surgery  1970'S  . Vaginal hysterectomy  1960'S  . Cholecystectomy open  1970'S  . Cystoscopy with retrograde pyelogram, ureteroscopy and stent placement N/A 02/13/2013    Procedure: CYSTOSCOPY WITH RETROGRADE PYELOGRAM, URETEROSCOPY AND LITHOTRIPSY ;  Surgeon: Claybon Jabs, MD;  Location: [facility];  Service: Urology;  Laterality: N/A;  . Holmium laser application Right 0/07/6008    Procedure: HOLMIUM LASER APPLICATION;  Surgeon: Claybon Jabs, MD;  Location: [facility];  Service: Urology;  Laterality: Right;   ALLERGIES:   Review of patient's allergies indicates no known allergies. Prior to Admit Meds:   Prescriptions prior to admission  Medication Sig Dispense Refill Last Dose  . apixaban (ELIQUIS) 5 MG TABS tablet Take 1 tablet (5 mg total) by mouth 2 (two) times daily. 180 tablet 3 01/13/2015  . diltiazem (CARDIZEM CD) 120 MG 24 hr capsule Take 1 capsule (120 mg total) by mouth daily.   01/16/2015 at 0800  . oxyCODONE (OXY IR/ROXICODONE) 5 MG immediate release tablet take 1 tablet by mouth four times a day if needed  0 01/13/2015   Family HX:    Family History  Problem Relation Age of Onset  . Diabetes type II Mother   . Diabetes Sister   . Hypertension Sister   . Lung cancer Father   . Hypertension  Brother   . Diabetes Brother   . Heart disease Brother   . Diabetes Brother   . Hypertension Brother   . Heart disease Brother    Social HX:    History   Social History  . Marital Status: Married    Spouse Name: N/A  . Number of Children: N/A  . Years of Education: N/A   Occupational History  . Not on file.   Social History Main Topics  . Smoking status: Never Smoker   . Smokeless tobacco: Never Used  . Alcohol Use: No  . Drug Use: No  . Sexual Activity: Not on file   Other Topics Concern  . Not on file   Social History Narrative     ROS  Headaches, occasional nausea, palpitations,  dyspnea on exertion, and lower extremity swelling or additional complaints.  Physical Exam: Blood pressure 159/83, pulse 75, temperature 97.6 F (36.4 C), temperature source Oral, resp. rate 18, height 5\' 2"  (1.575 m), weight 110.224 kg (243 lb), SpO2 97 %.    No acute distress but H disappearing. HEENT exam is unremarkable without evidence of jaundice or pallor. Neck exam reveals no JVD or carotid bruits.  Chest discomfort auscultation and percussion. Abdomen is soft. No tenderness is present. No bruits. Extremities reveal no edema. Neurological exam is normal.   Labs: Lab Results  Component Value Date   WBC 8.7 01/10/2015   HGB 12.7 01/10/2015   HCT 37.6 01/10/2015   MCV 87.3 01/10/2015   PLT 342.0 01/10/2015    Recent Labs Lab 01/10/15 1425  NA 142  K 3.5  CL 105  CO2 31  BUN 19  CREATININE 0.80  CALCIUM 9.0  GLUCOSE 117*   Lab Results  Component Value Date   CKTOTAL 48 03/29/2011   CKMB 1.3 03/29/2011   TROPONINI <0.30 05/03/2014     Radiology:   Myocardial Perfusion study: 01/09/2015   Nuclear stress EF: 76%.  There was no ST segment deviation noted during stress.  Defect 1: There is a defect present in the basal anterior, basal anteroseptal, basal inferolateral, basal anterolateral, mid anterior, mid anteroseptal, mid inferoseptal, mid inferolateral, mid anterolateral, apical anterior, apical septal, apical lateral and apex location.  Findings consistent with ischemia.  This is a high risk study.  The left ventricular ejection fraction is hyperdynamic (>65%).  This is a high risk study. There is a large size, moderate severity reversible perfusion defect in the entire anterior, anteroseptal, anterolateral, inferolateral and mid inferoseptal walls suspicious for left main disease (SDS 12).  A cardiac cath is recommended.   EKG:  Normal sinus rhythm with nonspecific anterior T-wave abnormality.   ASSESSMENT: 1. Atypical chest pain, high-risk  myocardial perfusion study, and other risk factors for arterial sclerosis. 2. Paroxysmal atrial fibrillation 3. Hypertension 4. Hyperlipidemia 5. Chronic anticoagulation therapy with Eliquis and no dose in the past 48 hours.  Plan:  1. Coronary angiography to define anatomy and help guide therapy. The procedure and risks have been discussed with the patient in detail including possibility of stroke, death, myocardial infarction, bleeding, kidney injury, and limb ischemia.   [name] 01/16/2015 11:54 AM

## 2015-01-16 NOTE — Interval H&P Note (Signed)
Cath Lab Visit (complete for each Cath Lab visit)  Clinical Evaluation Leading to the Procedure:   ACS: No.  Non-ACS:    Anginal Classification: CCS III  Anti-ischemic medical therapy: Minimal Therapy (1 class of medications)  Non-Invasive Test Results: High-risk stress test findings: cardiac mortality >3%/year  Prior CABG: No previous CABG      History and Physical Interval Note:  01/16/2015 1:32 PM  [name]  has presented today for surgery, with the diagnosis of high risk nuclear study  The various methods of treatment have been discussed with the patient and family. After consideration of risks, benefits and other options for treatment, the patient has consented to  Procedure(s): Left Heart Cath and Coronary Angiography (N/A) as a surgical intervention .  The patient's history has been reviewed, patient examined, no change in status, stable for surgery.  I have reviewed the patient's chart and labs.  Questions were answered to the patient's satisfaction.     [name]

## 2015-01-16 NOTE — Discharge Instructions (Signed)
Radial Site Care °Refer to this sheet in the next few weeks. These instructions provide you with information on caring for yourself after your procedure. Your caregiver may also give you more specific instructions. Your treatment has been planned according to current medical practices, but problems sometimes occur. Call your caregiver if you have any problems or questions after your procedure. °HOME CARE INSTRUCTIONS °· You may shower the day after the procedure. Remove the bandage (dressing) and gently wash the site with plain soap and water. Gently pat the site dry. °· Do not apply powder or lotion to the site. °· Do not submerge the affected site in water for 3 to 5 days. °· Inspect the site at least twice daily. °· Do not flex or bend the affected arm for 24 hours. °· No lifting over 5 pounds (2.3 kg) for 5 days after your procedure. °· Do not drive home if you are discharged the same day of the procedure. Have someone else drive you. °· You may drive 24 hours after the procedure unless otherwise instructed by your caregiver. °· Do not operate machinery or power tools for 24 hours. °· A responsible adult should be with you for the first 24 hours after you arrive home. °What to expect: °· Any bruising will usually fade within 1 to 2 weeks. °· Blood that collects in the tissue (hematoma) may be painful to the touch. It should usually decrease in size and tenderness within 1 to 2 weeks. °SEEK IMMEDIATE MEDICAL CARE IF: °· You have unusual pain at the radial site. °· You have redness, warmth, swelling, or pain at the radial site. °· You have drainage (other than a small amount of blood on the dressing). °· You have chills. °· You have a fever or persistent symptoms for more than 72 hours. °· You have a fever and your symptoms suddenly get worse. °· Your arm becomes pale, cool, tingly, or numb. °· You have heavy bleeding from the site. Hold pressure on the site. CALL 911 °Document Released: 08/01/2010 Document  Revised: 09/21/2011 Document Reviewed: 08/01/2010 °ExitCare® Patient Information ©2015 [employer]. This information is not intended to replace advice given to you by your health care provider. Make sure you discuss any questions you have with your health care provider. ° °

## 2015-01-17 ENCOUNTER — Encounter (HOSPITAL_COMMUNITY): Payer: Self-pay | Admitting: Interventional Cardiology

## 2015-01-18 MED FILL — Lidocaine HCl Local Preservative Free (PF) Inj 1%: INTRAMUSCULAR | Qty: 30 | Status: AC

## 2015-01-18 MED FILL — Heparin Sodium (Porcine) 2 Unit/ML in Sodium Chloride 0.9%: INTRAMUSCULAR | Qty: 1000 | Status: AC

## 2015-02-05 ENCOUNTER — Other Ambulatory Visit: Payer: Self-pay | Admitting: Neurosurgery

## 2015-02-05 DIAGNOSIS — M5136 Other intervertebral disc degeneration, lumbar region: Secondary | ICD-10-CM

## 2015-02-18 ENCOUNTER — Telehealth: Payer: Self-pay | Admitting: Interventional Cardiology

## 2015-02-18 NOTE — Telephone Encounter (Signed)
Will forward to Dr. Tamala Julian and Elberta Leatherwood, [facility] for review and advisement on holding Eliquis.

## 2015-02-18 NOTE — Telephone Encounter (Signed)
CHADS VASC is 2 and okay to hold Eliquis 48 hrs before surgery and resume when safe.

## 2015-02-18 NOTE — Telephone Encounter (Signed)
Pt on Eliquis for Afib.  Has a CHADS Score~1.  Ok to hold Eliquis x 48 hours prior to procedure.  Will fax information to Dr. Nelva Bush' office.

## 2015-02-18 NOTE — Telephone Encounter (Signed)
New message     Request for surgical clearance:  What type of surgery is being performed?  Pain management injection 1. When is this surgery scheduled? 03-13-15  2. Are there any medications that need to be held prior to surgery and how long? Hold eliquis?   3. Name of physician performing surgery? Dr Nelva Bush  4. What is your office phone and fax number? Fax [phone]

## 2015-02-20 NOTE — Telephone Encounter (Signed)
Checking to be sure this has been delt with.

## 2015-02-21 NOTE — Telephone Encounter (Signed)
Spoke with Estill Bamberg at [employer] and she states that clearance was received.

## 2015-07-01 ENCOUNTER — Telehealth: Payer: Self-pay | Admitting: Interventional Cardiology

## 2015-07-01 NOTE — Telephone Encounter (Signed)
Returned pt call. Pt sts that for the last 3-5 days she has been having "indigestion" or a burning in her chest. Pt denies any associated symptoms, pt denies chest pain, sob, palpitations, or feeling like her heart is racing.  Pt has a normal cardiac cath in July 2016.  Pt aware of Dr.Smith's recommendation. Pt should f/u with her pcp, pt should call the office if her pcp thinks she needs f/u with cardiology. Pt agreeable and verbalized understanding.

## 2015-07-01 NOTE — Telephone Encounter (Signed)
New Message  Pt husband  Calling to speak w/ rN- stated that pt c/o of bad indigestion for the past week. Please call back and discuss.

## 2015-07-16 ENCOUNTER — Other Ambulatory Visit: Payer: Self-pay | Admitting: Interventional Cardiology

## 2015-07-22 DIAGNOSIS — M180 Bilateral primary osteoarthritis of first carpometacarpal joints: Secondary | ICD-10-CM | POA: Diagnosis not present

## 2015-07-22 DIAGNOSIS — R200 Anesthesia of skin: Secondary | ICD-10-CM | POA: Diagnosis not present

## 2015-07-22 DIAGNOSIS — M79642 Pain in left hand: Secondary | ICD-10-CM | POA: Diagnosis not present

## 2015-07-22 DIAGNOSIS — G5603 Carpal tunnel syndrome, bilateral upper limbs: Secondary | ICD-10-CM | POA: Diagnosis not present

## 2015-07-22 DIAGNOSIS — M79641 Pain in right hand: Secondary | ICD-10-CM | POA: Diagnosis not present

## 2015-07-26 DIAGNOSIS — Z1211 Encounter for screening for malignant neoplasm of colon: Secondary | ICD-10-CM | POA: Diagnosis not present

## 2015-07-26 DIAGNOSIS — R195 Other fecal abnormalities: Secondary | ICD-10-CM | POA: Diagnosis not present

## 2015-08-01 ENCOUNTER — Telehealth: Payer: Self-pay | Admitting: Interventional Cardiology

## 2015-08-01 DIAGNOSIS — D225 Melanocytic nevi of trunk: Secondary | ICD-10-CM | POA: Diagnosis not present

## 2015-08-01 DIAGNOSIS — L905 Scar conditions and fibrosis of skin: Secondary | ICD-10-CM | POA: Diagnosis not present

## 2015-08-01 DIAGNOSIS — D485 Neoplasm of uncertain behavior of skin: Secondary | ICD-10-CM | POA: Diagnosis not present

## 2015-08-01 NOTE — Telephone Encounter (Signed)
New message      Calling to check status on clearance from Dr Amedeo Plenty for hand surgery.  They faxed the clearance form last week.  You do not have to call pt back---unless you have not received the clearance.  Pt is aware Dr Tamala Julian is out of office this week

## 2015-08-05 NOTE — Telephone Encounter (Signed)
Clearance received pending Dr.Smith's approval

## 2015-08-08 ENCOUNTER — Telehealth: Payer: Self-pay

## 2015-08-08 DIAGNOSIS — D2230 Melanocytic nevi of unspecified part of face: Secondary | ICD-10-CM | POA: Diagnosis not present

## 2015-08-08 DIAGNOSIS — L820 Inflamed seborrheic keratosis: Secondary | ICD-10-CM | POA: Diagnosis not present

## 2015-08-08 DIAGNOSIS — D1801 Hemangioma of skin and subcutaneous tissue: Secondary | ICD-10-CM | POA: Diagnosis not present

## 2015-08-08 DIAGNOSIS — L304 Erythema intertrigo: Secondary | ICD-10-CM | POA: Diagnosis not present

## 2015-08-08 NOTE — Telephone Encounter (Signed)
Signed cardiac clearance place in MR nurse fax box to be faxed to [facility] fax# [phone]

## 2015-08-23 DIAGNOSIS — G894 Chronic pain syndrome: Secondary | ICD-10-CM | POA: Diagnosis not present

## 2015-08-23 DIAGNOSIS — M47816 Spondylosis without myelopathy or radiculopathy, lumbar region: Secondary | ICD-10-CM | POA: Diagnosis not present

## 2015-08-23 DIAGNOSIS — Z79891 Long term (current) use of opiate analgesic: Secondary | ICD-10-CM | POA: Diagnosis not present

## 2015-09-05 ENCOUNTER — Other Ambulatory Visit: Payer: Self-pay

## 2015-09-05 ENCOUNTER — Telehealth: Payer: Self-pay | Admitting: Interventional Cardiology

## 2015-09-05 DIAGNOSIS — I480 Paroxysmal atrial fibrillation: Secondary | ICD-10-CM

## 2015-09-05 MED ORDER — APIXABAN 5 MG PO TABS: 5.0000 mg | ORAL_TABLET | Freq: Two times a day (BID) | ORAL | Status: DC

## 2015-09-05 NOTE — Telephone Encounter (Signed)
Completed pt assistance form mailed to [employer] for assistance for Elquis. Copy of completed form also mailed to pt at pt rqst

## 2015-09-16 DIAGNOSIS — I10 Essential (primary) hypertension: Secondary | ICD-10-CM | POA: Diagnosis not present

## 2015-09-16 DIAGNOSIS — R739 Hyperglycemia, unspecified: Secondary | ICD-10-CM | POA: Diagnosis not present

## 2015-09-17 ENCOUNTER — Telehealth: Payer: Self-pay

## 2015-09-17 NOTE — Telephone Encounter (Signed)
Patient has been appoved for Eliquis through [employer].

## 2015-09-18 DIAGNOSIS — I4891 Unspecified atrial fibrillation: Secondary | ICD-10-CM | POA: Diagnosis not present

## 2015-09-18 DIAGNOSIS — I10 Essential (primary) hypertension: Secondary | ICD-10-CM | POA: Diagnosis not present

## 2015-09-18 DIAGNOSIS — R739 Hyperglycemia, unspecified: Secondary | ICD-10-CM | POA: Diagnosis not present

## 2015-09-19 DIAGNOSIS — G5601 Carpal tunnel syndrome, right upper limb: Secondary | ICD-10-CM | POA: Diagnosis not present

## 2015-09-19 DIAGNOSIS — M1812 Unilateral primary osteoarthritis of first carpometacarpal joint, left hand: Secondary | ICD-10-CM | POA: Diagnosis not present

## 2015-09-19 DIAGNOSIS — G5602 Carpal tunnel syndrome, left upper limb: Secondary | ICD-10-CM | POA: Diagnosis not present

## 2015-09-19 DIAGNOSIS — M189 Osteoarthritis of first carpometacarpal joint, unspecified: Secondary | ICD-10-CM | POA: Diagnosis not present

## 2015-09-19 DIAGNOSIS — G8918 Other acute postprocedural pain: Secondary | ICD-10-CM | POA: Diagnosis not present

## 2015-09-19 DIAGNOSIS — D481 Neoplasm of uncertain behavior of connective and other soft tissue: Secondary | ICD-10-CM | POA: Diagnosis not present

## 2015-09-20 DIAGNOSIS — G5602 Carpal tunnel syndrome, left upper limb: Secondary | ICD-10-CM | POA: Diagnosis not present

## 2015-09-20 DIAGNOSIS — M1812 Unilateral primary osteoarthritis of first carpometacarpal joint, left hand: Secondary | ICD-10-CM | POA: Diagnosis not present

## 2015-10-04 DIAGNOSIS — Z4789 Encounter for other orthopedic aftercare: Secondary | ICD-10-CM | POA: Diagnosis not present

## 2015-10-15 DIAGNOSIS — N200 Calculus of kidney: Secondary | ICD-10-CM | POA: Diagnosis not present

## 2015-10-15 DIAGNOSIS — Z0000 Encounter for general adult medical examination without abnormal findings: Secondary | ICD-10-CM | POA: Diagnosis not present

## 2015-10-17 DIAGNOSIS — Z4789 Encounter for other orthopedic aftercare: Secondary | ICD-10-CM | POA: Diagnosis not present

## 2015-10-31 DIAGNOSIS — M79641 Pain in right hand: Secondary | ICD-10-CM | POA: Diagnosis not present

## 2015-10-31 DIAGNOSIS — Z4789 Encounter for other orthopedic aftercare: Secondary | ICD-10-CM | POA: Diagnosis not present

## 2015-11-06 DIAGNOSIS — M79641 Pain in right hand: Secondary | ICD-10-CM | POA: Diagnosis not present

## 2015-11-15 DIAGNOSIS — M79642 Pain in left hand: Secondary | ICD-10-CM | POA: Diagnosis not present

## 2015-12-02 DIAGNOSIS — Z4789 Encounter for other orthopedic aftercare: Secondary | ICD-10-CM | POA: Diagnosis not present

## 2015-12-18 NOTE — Progress Notes (Signed)
Cardiology Office Note    Date:  12/19/2015   ID:  [name], DOB 1945/07/19, MRN [id number]  PCP:  Jani Gravel, MD  Cardiologist: [name], MD   Chief Complaint  Patient presents with  . Atrial Fibrillation    History of Present Illness:  [name] is a 70 y.o. female follow-up paroxysmal atrial fibrillation and chronic anticoagulation therapy. There is also history of hypertension.  The patient is accompanied by husband. She has no cardiopulmonary complaints. Her complaints are more diffuse and varied. She has had no transient neurological symptoms. She denies palpitations and has not had syncope.    Past Medical History  Diagnosis Date  . Arthritis     both knees  . History of kidney stones   . Carpal tunnel syndrome, bilateral     both hands go to sleep at times from elbows down  . Right ureteral stone   . PONV (postoperative nausea and vomiting)   . Postlaminectomy syndrome   . DDD (degenerative disc disease), lumbosacral   . First degree AV block   . Renal calculi     BILATERAL    Past Surgical History  Procedure Laterality Date  . Knee arthroscopy Bilateral   . Appendectomy    . Total knee arthroplasty  04/13/2012    Procedure: TOTAL KNEE ARTHROPLASTY;  Surgeon: Gearlean Alf, MD;  Location: [facility];  Service: Orthopedics;  Laterality: Left;  . Total knee arthroplasty  08/08/2012    Procedure: TOTAL KNEE ARTHROPLASTY;  Surgeon: Gearlean Alf, MD;  Location: [facility];  Service: Orthopedics;  Laterality: Right;  . Anterior cervical decomp/discectomy fusion  03-18-2011    C3 -- C7  . Lumbar laminectomy  X2  1960'S  . Extracorporeal shock wave lithotripsy Right 05-16-2012  . Shoulder open rotator cuff repair Bilateral LEFT 01-11-2001/   RIGHT 07-21-2002  . Transthoracic echocardiogram  03-29-2011    MODERATE LVH/ EF 60-65%/ MILD MR  . Nasal septum surgery  1970'S  . Vaginal hysterectomy  1960'S  . Cholecystectomy open  1970'S  . Cystoscopy  with retrograde pyelogram, ureteroscopy and stent placement N/A 02/13/2013    Procedure: CYSTOSCOPY WITH RETROGRADE PYELOGRAM, URETEROSCOPY AND LITHOTRIPSY ;  Surgeon: Claybon Jabs, MD;  Location: [facility];  Service: Urology;  Laterality: N/A;  . Holmium laser application Right [id number]    Procedure: HOLMIUM LASER APPLICATION;  Surgeon: Claybon Jabs, MD;  Location: [facility];  Service: Urology;  Laterality: Right;  . Cardiac catheterization N/A 01/16/2015    Procedure: Left Heart Cath and Coronary Angiography;  Surgeon: Belva Crome, MD;  Location: [facility];  Service: Cardiovascular;  Laterality: N/A;    Current Medications: Outpatient Prescriptions Prior to Visit  Medication Sig Dispense Refill  . apixaban (ELIQUIS) 5 MG TABS tablet Take 1 tablet (5 mg total) by mouth 2 (two) times daily. 180 tablet 3  . diltiazem (CARTIA XT) 120 MG 24 hr capsule Take 1 capsule (120 mg total) by mouth daily. 30 capsule 5  . oxyCODONE (OXY IR/ROXICODONE) 5 MG immediate release tablet Reported on 12/19/2015  0   No facility-administered medications prior to visit.     Allergies:   Review of patient's allergies indicates no known allergies.   Social History   Social History  . Marital Status: Married    Spouse Name: N/A  . Number of Children: N/A  . Years of Education: N/A   Social History Main  Topics  . Smoking status: Never Smoker   . Smokeless tobacco: Never Used  . Alcohol Use: No  . Drug Use: No  . Sexual Activity: Not Asked   Other Topics Concern  . None   Social History Narrative     Family History:  The patient's family history includes Diabetes in her brother, brother, and sister; Diabetes type II in her mother; Heart disease in her brother and brother; Hypertension in her brother, brother, and sister; Lung cancer in her father.   ROS:   Please see the history of present illness.    Concerned about whether she will have to continue her  current medical regimen long-term. Voices no particular side effects to the noted medications. Some shortness of breath with exertion. Not exercising.  All other systems reviewed and are negative.   PHYSICAL EXAM:   VS:  BP 144/82 mmHg  Pulse 66  Ht 5\' 2"  (1.575 m)  Wt 236 lb 6.4 oz (107.23 kg)  BMI 43.23 kg/m2   GEN: Well nourished, well developed, in no acute distress HEENT: normal Neck: no JVD, carotid bruits, or masses Cardiac: RRR; no murmurs, rubs, or gallops,no edema  Respiratory:  clear to auscultation bilaterally, normal work of breathing GI: soft, nontender, nondistended, + BS MS: no deformity or atrophy Skin: warm and dry, no rash Neuro:  Alert and Oriented x 3, Strength and sensation are intact Psych: euthymic mood, full affect  Wt Readings from Last 3 Encounters:  12/19/15 236 lb 6.4 oz (107.23 kg)  01/16/15 243 lb (110.224 kg)  01/08/15 245 lb (111.131 kg)      Studies/Labs Reviewed:   EKG:  EKG  Sinus rhythm at 66 bpm with nonspecific T-wave flattening. No acute ST-T wave changes are noted.  Recent Labs: 01/10/2015: BUN 19; Creatinine, Ser 0.80; Hemoglobin 12.7; Platelets 342.0; Potassium 3.5; Sodium 142   Lipid Panel No results found for: CHOL, TRIG, HDL, CHOLHDL, VLDL, LDLCALC, LDLDIRECT  Additional studies/ records that were reviewed today include:  No new data is available    ASSESSMENT:    1. Paroxysmal atrial fibrillation (HCC)   2. Chronic diastolic heart failure ([place])   3. Essential hypertension   4. Chronic anticoagulation      PLAN:  In order of problems listed above:  1. The patient is doing well with reference to paroxysmal atrial fibrillation. No prolonged or recurrent episodes. We discussed the need for chronic long-term anticoagulation therapy. 2. There is no evidence of volume overload. Low salt diet distress. 3. Aerobic exercise, low-salt diet, and weight reduction or stress. 4. We have recommended that she closely observe her  urine and stools for any evidence of bleeding. We also request that she notify us if any head trauma.     Medication Adjustments/Labs and Tests Ordered: Current medicines are reviewed at length with the patient today.  Concerns regarding medicines are outlined above.  Medication changes, Labs and Tests ordered today are listed in the Patient Instructions below. Patient Instructions  Medication Instructions:  Your physician recommends that you continue on your current medications as directed. Please refer to the Current Medication list given to you today.   Labwork: None ordered   Testing/Procedures: None ordered   Follow-Up: Your physician wants you to follow-up in: 12 months with Dr Gaspar Bidding will receive a reminder letter in the mail two months in advance. If you don't receive a letter, please call our office to schedule the follow-up appointment.   Any Other Special Instructions Will  Be Listed Below (If Applicable).    Be active try and walk daily    If you need a refill on your cardiac medications before your next appointment, please call your pharmacy.       Signed, [name], MD  12/19/2015 2:06 PM    [facility] [address], [place], [place]  [address] Phone: [phone]; Fax: [phone]

## 2015-12-19 ENCOUNTER — Encounter: Payer: Self-pay | Admitting: Interventional Cardiology

## 2015-12-19 ENCOUNTER — Ambulatory Visit (INDEPENDENT_AMBULATORY_CARE_PROVIDER_SITE_OTHER): Payer: PPO | Admitting: Interventional Cardiology

## 2015-12-19 VITALS — BP 144/82 | HR 66 | Ht 62.0 in | Wt 236.4 lb

## 2015-12-19 DIAGNOSIS — I10 Essential (primary) hypertension: Secondary | ICD-10-CM

## 2015-12-19 DIAGNOSIS — I5032 Chronic diastolic (congestive) heart failure: Secondary | ICD-10-CM

## 2015-12-19 DIAGNOSIS — I480 Paroxysmal atrial fibrillation: Secondary | ICD-10-CM | POA: Diagnosis not present

## 2015-12-19 DIAGNOSIS — Z7901 Long term (current) use of anticoagulants: Secondary | ICD-10-CM | POA: Diagnosis not present

## 2015-12-19 NOTE — Patient Instructions (Addendum)
Medication Instructions:  Your physician recommends that you continue on your current medications as directed. Please refer to the Current Medication list given to you today.   Labwork: None ordered   Testing/Procedures: None ordered   Follow-Up: Your physician wants you to follow-up in: 12 months with Dr Gaspar Bidding will receive a reminder letter in the mail two months in advance. If you don't receive a letter, please call our office to schedule the follow-up appointment.   Any Other Special Instructions Will Be Listed Below (If Applicable).    Be active try and walk daily    If you need a refill on your cardiac medications before your next appointment, please call your pharmacy.

## 2016-01-08 DIAGNOSIS — L821 Other seborrheic keratosis: Secondary | ICD-10-CM | POA: Diagnosis not present

## 2016-01-08 DIAGNOSIS — D1801 Hemangioma of skin and subcutaneous tissue: Secondary | ICD-10-CM | POA: Diagnosis not present

## 2016-01-08 DIAGNOSIS — Z872 Personal history of diseases of the skin and subcutaneous tissue: Secondary | ICD-10-CM | POA: Diagnosis not present

## 2016-01-08 DIAGNOSIS — D225 Melanocytic nevi of trunk: Secondary | ICD-10-CM | POA: Diagnosis not present

## 2016-01-08 DIAGNOSIS — D2239 Melanocytic nevi of other parts of face: Secondary | ICD-10-CM | POA: Diagnosis not present

## 2016-01-08 DIAGNOSIS — D485 Neoplasm of uncertain behavior of skin: Secondary | ICD-10-CM | POA: Diagnosis not present

## 2016-01-08 DIAGNOSIS — L304 Erythema intertrigo: Secondary | ICD-10-CM | POA: Diagnosis not present

## 2016-01-13 DIAGNOSIS — Z4789 Encounter for other orthopedic aftercare: Secondary | ICD-10-CM | POA: Diagnosis not present

## 2016-01-13 DIAGNOSIS — R739 Hyperglycemia, unspecified: Secondary | ICD-10-CM | POA: Diagnosis not present

## 2016-01-13 DIAGNOSIS — I10 Essential (primary) hypertension: Secondary | ICD-10-CM | POA: Diagnosis not present

## 2016-01-15 DIAGNOSIS — M47816 Spondylosis without myelopathy or radiculopathy, lumbar region: Secondary | ICD-10-CM | POA: Diagnosis not present

## 2016-01-17 DIAGNOSIS — I4891 Unspecified atrial fibrillation: Secondary | ICD-10-CM | POA: Diagnosis not present

## 2016-01-17 DIAGNOSIS — M1990 Unspecified osteoarthritis, unspecified site: Secondary | ICD-10-CM | POA: Diagnosis not present

## 2016-01-17 DIAGNOSIS — Z0000 Encounter for general adult medical examination without abnormal findings: Secondary | ICD-10-CM | POA: Diagnosis not present

## 2016-01-17 DIAGNOSIS — R739 Hyperglycemia, unspecified: Secondary | ICD-10-CM | POA: Diagnosis not present

## 2016-01-24 ENCOUNTER — Other Ambulatory Visit: Payer: Self-pay | Admitting: Gastroenterology

## 2016-01-24 DIAGNOSIS — D123 Benign neoplasm of transverse colon: Secondary | ICD-10-CM | POA: Diagnosis not present

## 2016-01-24 DIAGNOSIS — D124 Benign neoplasm of descending colon: Secondary | ICD-10-CM | POA: Diagnosis not present

## 2016-01-24 DIAGNOSIS — D120 Benign neoplasm of cecum: Secondary | ICD-10-CM | POA: Diagnosis not present

## 2016-01-24 DIAGNOSIS — D126 Benign neoplasm of colon, unspecified: Secondary | ICD-10-CM | POA: Diagnosis not present

## 2016-01-24 DIAGNOSIS — K5730 Diverticulosis of large intestine without perforation or abscess without bleeding: Secondary | ICD-10-CM | POA: Diagnosis not present

## 2016-01-24 DIAGNOSIS — D125 Benign neoplasm of sigmoid colon: Secondary | ICD-10-CM | POA: Diagnosis not present

## 2016-01-24 DIAGNOSIS — Z1211 Encounter for screening for malignant neoplasm of colon: Secondary | ICD-10-CM | POA: Diagnosis not present

## 2016-01-24 DIAGNOSIS — K635 Polyp of colon: Secondary | ICD-10-CM | POA: Diagnosis not present

## 2016-01-31 ENCOUNTER — Other Ambulatory Visit: Payer: Self-pay | Admitting: Interventional Cardiology

## 2016-03-04 DIAGNOSIS — M25551 Pain in right hip: Secondary | ICD-10-CM | POA: Diagnosis not present

## 2016-03-26 DIAGNOSIS — G894 Chronic pain syndrome: Secondary | ICD-10-CM | POA: Diagnosis not present

## 2016-03-26 DIAGNOSIS — Z79891 Long term (current) use of opiate analgesic: Secondary | ICD-10-CM | POA: Diagnosis not present

## 2016-03-26 DIAGNOSIS — M5136 Other intervertebral disc degeneration, lumbar region: Secondary | ICD-10-CM | POA: Diagnosis not present

## 2016-04-17 DIAGNOSIS — Z23 Encounter for immunization: Secondary | ICD-10-CM | POA: Diagnosis not present

## 2016-04-20 DIAGNOSIS — H524 Presbyopia: Secondary | ICD-10-CM | POA: Diagnosis not present

## 2016-06-02 DIAGNOSIS — E875 Hyperkalemia: Secondary | ICD-10-CM | POA: Diagnosis not present

## 2016-06-02 DIAGNOSIS — E119 Type 2 diabetes mellitus without complications: Secondary | ICD-10-CM | POA: Diagnosis not present

## 2016-06-02 DIAGNOSIS — Z23 Encounter for immunization: Secondary | ICD-10-CM | POA: Diagnosis not present

## 2016-06-02 DIAGNOSIS — I10 Essential (primary) hypertension: Secondary | ICD-10-CM | POA: Diagnosis not present

## 2016-06-02 DIAGNOSIS — E038 Other specified hypothyroidism: Secondary | ICD-10-CM | POA: Diagnosis not present

## 2016-06-08 DIAGNOSIS — M5136 Other intervertebral disc degeneration, lumbar region: Secondary | ICD-10-CM | POA: Diagnosis not present

## 2016-06-08 DIAGNOSIS — Z6841 Body Mass Index (BMI) 40.0 and over, adult: Secondary | ICD-10-CM | POA: Diagnosis not present

## 2016-06-08 DIAGNOSIS — Z0000 Encounter for general adult medical examination without abnormal findings: Secondary | ICD-10-CM | POA: Diagnosis not present

## 2016-06-08 DIAGNOSIS — N189 Chronic kidney disease, unspecified: Secondary | ICD-10-CM | POA: Diagnosis not present

## 2016-06-08 DIAGNOSIS — K219 Gastro-esophageal reflux disease without esophagitis: Secondary | ICD-10-CM | POA: Diagnosis not present

## 2016-06-08 DIAGNOSIS — E1165 Type 2 diabetes mellitus with hyperglycemia: Secondary | ICD-10-CM | POA: Diagnosis not present

## 2016-06-08 DIAGNOSIS — M810 Age-related osteoporosis without current pathological fracture: Secondary | ICD-10-CM | POA: Diagnosis not present

## 2016-06-15 DIAGNOSIS — M5021 Other cervical disc displacement,  high cervical region: Secondary | ICD-10-CM | POA: Diagnosis not present

## 2016-06-15 DIAGNOSIS — M542 Cervicalgia: Secondary | ICD-10-CM | POA: Diagnosis not present

## 2016-06-15 DIAGNOSIS — Z6841 Body Mass Index (BMI) 40.0 and over, adult: Secondary | ICD-10-CM | POA: Diagnosis not present

## 2016-06-15 DIAGNOSIS — R030 Elevated blood-pressure reading, without diagnosis of hypertension: Secondary | ICD-10-CM | POA: Diagnosis not present

## 2016-06-24 DIAGNOSIS — M47816 Spondylosis without myelopathy or radiculopathy, lumbar region: Secondary | ICD-10-CM | POA: Diagnosis not present

## 2016-06-29 DIAGNOSIS — N200 Calculus of kidney: Secondary | ICD-10-CM | POA: Diagnosis not present

## 2016-07-13 HISTORY — PX: COLONOSCOPY: SHX174

## 2016-07-15 DIAGNOSIS — N390 Urinary tract infection, site not specified: Secondary | ICD-10-CM | POA: Diagnosis not present

## 2016-07-15 DIAGNOSIS — R739 Hyperglycemia, unspecified: Secondary | ICD-10-CM | POA: Diagnosis not present

## 2016-07-15 DIAGNOSIS — Z0000 Encounter for general adult medical examination without abnormal findings: Secondary | ICD-10-CM | POA: Diagnosis not present

## 2016-07-15 DIAGNOSIS — I4891 Unspecified atrial fibrillation: Secondary | ICD-10-CM | POA: Diagnosis not present

## 2016-07-17 DIAGNOSIS — I4891 Unspecified atrial fibrillation: Secondary | ICD-10-CM | POA: Diagnosis not present

## 2016-07-17 DIAGNOSIS — E7439 Other disorders of intestinal carbohydrate absorption: Secondary | ICD-10-CM | POA: Diagnosis not present

## 2016-07-17 DIAGNOSIS — Z0000 Encounter for general adult medical examination without abnormal findings: Secondary | ICD-10-CM | POA: Diagnosis not present

## 2016-07-17 DIAGNOSIS — L989 Disorder of the skin and subcutaneous tissue, unspecified: Secondary | ICD-10-CM | POA: Diagnosis not present

## 2016-08-26 DIAGNOSIS — D3709 Neoplasm of uncertain behavior of other specified sites of the oral cavity: Secondary | ICD-10-CM | POA: Diagnosis not present

## 2016-09-02 ENCOUNTER — Telehealth: Payer: Self-pay | Admitting: *Deleted

## 2016-09-02 ENCOUNTER — Telehealth: Payer: Self-pay | Admitting: Interventional Cardiology

## 2016-09-02 NOTE — Telephone Encounter (Signed)
Pt requesting a refill through [facility] that requires authorization. Anticoagulation Clinic is unfamiliar with the Assistance Program and does not complete prior authorizations; therefore will have to to defer back to the Nurse/Refill Dept.  Also, the pt has not had labs done since 01/10/2015, therefore, the pt needs labs drawn to ensure dosing criteria accuracy.  Left a msg to inform pt that we need to obtain labs on her next  available date this week or early next week to ensure dosing accuracy. Left the Anticoagulation Clinic number to call back to schedule a lab appt.

## 2016-09-02 NOTE — Telephone Encounter (Signed)
PT CALLED BACK , WILL BRING IN PA APP AND PICK UP A MONTH OF SAMPLES (ELIQUIS 5MG )

## 2016-09-02 NOTE — Telephone Encounter (Signed)
called to LVM that pt has PA form and to please return it so we can get her help with her Eliquis and that we will provide samples until approved.

## 2016-09-02 NOTE — Telephone Encounter (Signed)
Pt returning call to Jennifer.

## 2016-09-02 NOTE — Telephone Encounter (Signed)
°*  STAT* If patient is at the pharmacy, call can be transferred to refill team.   1. Which medications need to be refilled? (please list name of each medication and dose if known) eliquis 5mg  2. Which pharmacy/location (including street and city if local pharmacy) is medication to be sent to? [facility]  3. Do they need a 30 day or 90 day supply? 90 for a free supply

## 2016-09-03 ENCOUNTER — Other Ambulatory Visit: Payer: Self-pay | Admitting: Interventional Cardiology

## 2016-09-03 ENCOUNTER — Other Ambulatory Visit: Payer: PPO | Admitting: *Deleted

## 2016-09-03 DIAGNOSIS — I4891 Unspecified atrial fibrillation: Secondary | ICD-10-CM

## 2016-09-03 DIAGNOSIS — Z7901 Long term (current) use of anticoagulants: Secondary | ICD-10-CM

## 2016-09-03 DIAGNOSIS — I480 Paroxysmal atrial fibrillation: Secondary | ICD-10-CM

## 2016-09-03 LAB — BASIC METABOLIC PANEL
BUN / CREAT RATIO: 22 (ref 12–28)
BUN: 17 mg/dL (ref 8–27)
CO2: 24 mmol/L (ref 18–29)
CREATININE: 0.77 mg/dL (ref 0.57–1.00)
Calcium: 9.2 mg/dL (ref 8.7–10.3)
Chloride: 100 mmol/L (ref 96–106)
GFR calc Af Amer: 90 (ref >59)
GFR calc non Af Amer: 78 (ref >59)
GLUCOSE: 152 mg/dL — AB (ref 65–99)
Potassium: 4.4 mmol/L (ref 3.5–5.2)
Sodium: 140 mmol/L (ref 134–144)

## 2016-09-03 LAB — CBC
Hematocrit: 37.6 % (ref 34.0–46.6)
Hemoglobin: 12.5 g/dL (ref 11.1–15.9)
MCH: 29.3 pg (ref 26.6–33.0)
MCHC: 33.2 g/dL (ref 31.5–35.7)
MCV: 88 fL (ref 79–97)
PLATELETS: 350 10*3/uL (ref 150–379)
RBC: 4.26 x10E6/uL (ref 3.77–5.28)
RDW: 13.5 % (ref 12.3–15.4)
WBC: 8.6 10*3/uL (ref 3.4–10.8)

## 2016-09-03 NOTE — Telephone Encounter (Signed)
Pt came in this morning & inquired about samples & stated someone had told her to pick up samples. After researching chart it was noted that [place] had called pt & left samples for her in the refill dept. Also, while pt was here I put labs in for the pt & she was taken to the lab after being registered. Also, she received samples from the refill dept at that time.  Will follow the labs once resulted to ensure correct doseage.

## 2016-09-04 DIAGNOSIS — Z79891 Long term (current) use of opiate analgesic: Secondary | ICD-10-CM | POA: Diagnosis not present

## 2016-09-04 DIAGNOSIS — M47816 Spondylosis without myelopathy or radiculopathy, lumbar region: Secondary | ICD-10-CM | POA: Diagnosis not present

## 2016-09-04 DIAGNOSIS — G894 Chronic pain syndrome: Secondary | ICD-10-CM | POA: Diagnosis not present

## 2016-09-04 DIAGNOSIS — M5136 Other intervertebral disc degeneration, lumbar region: Secondary | ICD-10-CM | POA: Diagnosis not present

## 2016-09-04 NOTE — Telephone Encounter (Signed)
Labs reviewed, Eliquis 5mg s BID is appropriate dose by criteria recommendations. Rx sent in by L. Good[place], CMA today per Epic.

## 2016-09-07 ENCOUNTER — Telehealth: Payer: Self-pay | Admitting: *Deleted

## 2016-09-07 NOTE — Telephone Encounter (Signed)
Lmtcb to go over results 

## 2016-09-09 ENCOUNTER — Telehealth: Payer: Self-pay | Admitting: Interventional Cardiology

## 2016-09-09 NOTE — Telephone Encounter (Signed)
Left message to call back  

## 2016-09-09 NOTE — Telephone Encounter (Signed)
New message ° ° ° °Pt is returning call about lab results. °

## 2016-09-10 ENCOUNTER — Telehealth: Payer: Self-pay

## 2016-09-10 NOTE — Telephone Encounter (Signed)
Received a fax from [employer], stating they have received her application and will respond within 2 business days.

## 2016-09-11 ENCOUNTER — Other Ambulatory Visit: Payer: Self-pay | Admitting: *Deleted

## 2016-09-11 ENCOUNTER — Encounter: Payer: Self-pay | Admitting: Interventional Cardiology

## 2016-09-11 DIAGNOSIS — I480 Paroxysmal atrial fibrillation: Secondary | ICD-10-CM

## 2016-09-11 NOTE — Telephone Encounter (Signed)
Message sent through [facility]. Initiated by pt.

## 2016-09-14 ENCOUNTER — Other Ambulatory Visit: Payer: Self-pay | Admitting: *Deleted

## 2016-09-14 DIAGNOSIS — R0602 Shortness of breath: Secondary | ICD-10-CM | POA: Diagnosis not present

## 2016-09-14 DIAGNOSIS — I480 Paroxysmal atrial fibrillation: Secondary | ICD-10-CM

## 2016-09-14 DIAGNOSIS — R05 Cough: Secondary | ICD-10-CM | POA: Diagnosis not present

## 2016-09-14 MED ORDER — APIXABAN 5 MG PO TABS: 5.0000 mg | ORAL_TABLET | Freq: Two times a day (BID) | ORAL | 3 refills | Status: DC

## 2016-09-21 DIAGNOSIS — R05 Cough: Secondary | ICD-10-CM | POA: Diagnosis not present

## 2016-09-24 NOTE — Telephone Encounter (Signed)
LMTCB. Received a fax from [facility] stating that the pt is not eligible for pt assistance. She will need to spend 3% of her yearly income in order to receive assistance through [facility] pt assistance program.

## 2016-09-26 ENCOUNTER — Emergency Department (HOSPITAL_COMMUNITY): Payer: PPO

## 2016-09-26 ENCOUNTER — Observation Stay (HOSPITAL_COMMUNITY): Payer: PPO

## 2016-09-26 ENCOUNTER — Inpatient Hospital Stay (HOSPITAL_COMMUNITY)
Admission: EM | Admit: 2016-09-26 | Discharge: 2016-09-29 | DRG: 189 | Disposition: A | Discharge status: Home Health | Payer: PPO | Attending: Internal Medicine | Admitting: Internal Medicine

## 2016-09-26 ENCOUNTER — Encounter (HOSPITAL_COMMUNITY): Payer: Self-pay

## 2016-09-26 DIAGNOSIS — F064 Anxiety disorder due to known physiological condition: Secondary | ICD-10-CM | POA: Diagnosis present

## 2016-09-26 DIAGNOSIS — R0603 Acute respiratory distress: Secondary | ICD-10-CM | POA: Diagnosis present

## 2016-09-26 DIAGNOSIS — J9601 Acute respiratory failure with hypoxia: Principal | ICD-10-CM | POA: Diagnosis present

## 2016-09-26 DIAGNOSIS — Z7951 Long term (current) use of inhaled steroids: Secondary | ICD-10-CM | POA: Diagnosis not present

## 2016-09-26 DIAGNOSIS — D72829 Elevated white blood cell count, unspecified: Secondary | ICD-10-CM | POA: Diagnosis present

## 2016-09-26 DIAGNOSIS — E872 Acidosis: Secondary | ICD-10-CM | POA: Diagnosis present

## 2016-09-26 DIAGNOSIS — R7989 Other specified abnormal findings of blood chemistry: Secondary | ICD-10-CM | POA: Diagnosis present

## 2016-09-26 DIAGNOSIS — Z79899 Other long term (current) drug therapy: Secondary | ICD-10-CM | POA: Diagnosis not present

## 2016-09-26 DIAGNOSIS — T380X5A Adverse effect of glucocorticoids and synthetic analogues, initial encounter: Secondary | ICD-10-CM | POA: Diagnosis not present

## 2016-09-26 DIAGNOSIS — R0602 Shortness of breath: Secondary | ICD-10-CM

## 2016-09-26 DIAGNOSIS — I110 Hypertensive heart disease with heart failure: Secondary | ICD-10-CM | POA: Diagnosis present

## 2016-09-26 DIAGNOSIS — R739 Hyperglycemia, unspecified: Secondary | ICD-10-CM | POA: Diagnosis present

## 2016-09-26 DIAGNOSIS — I480 Paroxysmal atrial fibrillation: Secondary | ICD-10-CM | POA: Diagnosis not present

## 2016-09-26 DIAGNOSIS — E669 Obesity, unspecified: Secondary | ICD-10-CM | POA: Diagnosis not present

## 2016-09-26 DIAGNOSIS — F419 Anxiety disorder, unspecified: Secondary | ICD-10-CM | POA: Diagnosis present

## 2016-09-26 DIAGNOSIS — J45901 Unspecified asthma with (acute) exacerbation: Secondary | ICD-10-CM | POA: Diagnosis not present

## 2016-09-26 DIAGNOSIS — I5032 Chronic diastolic (congestive) heart failure: Secondary | ICD-10-CM | POA: Diagnosis present

## 2016-09-26 DIAGNOSIS — I10 Essential (primary) hypertension: Secondary | ICD-10-CM | POA: Diagnosis present

## 2016-09-26 DIAGNOSIS — J209 Acute bronchitis, unspecified: Secondary | ICD-10-CM | POA: Diagnosis not present

## 2016-09-26 DIAGNOSIS — Z7901 Long term (current) use of anticoagulants: Secondary | ICD-10-CM | POA: Diagnosis not present

## 2016-09-26 DIAGNOSIS — I482 Chronic atrial fibrillation: Secondary | ICD-10-CM | POA: Diagnosis not present

## 2016-09-26 DIAGNOSIS — Z96653 Presence of artificial knee joint, bilateral: Secondary | ICD-10-CM | POA: Diagnosis not present

## 2016-09-26 DIAGNOSIS — Z6841 Body Mass Index (BMI) 40.0 and over, adult: Secondary | ICD-10-CM

## 2016-09-26 DIAGNOSIS — R05 Cough: Secondary | ICD-10-CM | POA: Diagnosis not present

## 2016-09-26 DIAGNOSIS — J189 Pneumonia, unspecified organism: Secondary | ICD-10-CM

## 2016-09-26 LAB — I-STAT ARTERIAL BLOOD GAS, ED
ACID-BASE DEFICIT: 1 mmol/L (ref 0.0–2.0)
BICARBONATE: 24.4 mmol/L (ref 20.0–28.0)
O2 SAT: 95 %
PO2 ART: 77 mmHg — AB (ref 83.0–108.0)
TCO2: 26 mmol/L (ref 0–100)
pCO2 arterial: 43.2 mmHg (ref 32.0–48.0)
pH, Arterial: 7.36 (ref 7.350–7.450)

## 2016-09-26 LAB — RESPIRATORY PANEL BY PCR
Adenovirus: NOT DETECTED
BORDETELLA PERTUSSIS-RVPCR: NOT DETECTED
CHLAMYDOPHILA PNEUMONIAE-RVPPCR: NOT DETECTED
Coronavirus 229E: NOT DETECTED
Coronavirus HKU1: NOT DETECTED
Coronavirus NL63: NOT DETECTED
Coronavirus OC43: NOT DETECTED
INFLUENZA A-RVPPCR: NOT DETECTED
INFLUENZA B-RVPPCR: NOT DETECTED
Metapneumovirus: NOT DETECTED
Mycoplasma pneumoniae: NOT DETECTED
PARAINFLUENZA VIRUS 3-RVPPCR: NOT DETECTED
PARAINFLUENZA VIRUS 4-RVPPCR: NOT DETECTED
Parainfluenza Virus 1: NOT DETECTED
Parainfluenza Virus 2: NOT DETECTED
RHINOVIRUS / ENTEROVIRUS - RVPPCR: NOT DETECTED
Respiratory Syncytial Virus: NOT DETECTED

## 2016-09-26 LAB — CBC WITH DIFFERENTIAL/PLATELET
BASOS PCT: 1 %
Basophils Absolute: 0.1 10*3/uL (ref 0.0–0.1)
Eosinophils Absolute: 0.5 10*3/uL (ref 0.0–0.7)
Eosinophils Relative: 5 %
HEMATOCRIT: 39.9 % (ref 36.0–46.0)
HEMOGLOBIN: 13.1 g/dL (ref 12.0–15.0)
Lymphocytes Relative: 26 %
Lymphs Abs: 2.8 10*3/uL (ref 0.7–4.0)
MCH: 29.4 pg (ref 26.0–34.0)
MCHC: 32.8 g/dL (ref 30.0–36.0)
MCV: 89.5 fL (ref 78.0–100.0)
MONOS PCT: 4 %
Monocytes Absolute: 0.5 10*3/uL (ref 0.1–1.0)
NEUTROS PCT: 64 %
Neutro Abs: 6.9 10*3/uL (ref 1.7–7.7)
Platelets: 303 10*3/uL (ref 150–400)
RBC: 4.46 MIL/uL (ref 3.87–5.11)
RDW: 13.2 % (ref 11.5–15.5)
WBC: 10.8 10*3/uL — AB (ref 4.0–10.5)

## 2016-09-26 LAB — LACTIC ACID, PLASMA
LACTIC ACID, VENOUS: 7.6 mmol/L — AB (ref 0.5–1.9)
Lactic Acid, Venous: 5.9 mmol/L (ref 0.5–1.9)

## 2016-09-26 LAB — COMPREHENSIVE METABOLIC PANEL
ALT: 18 U/L (ref 14–54)
AST: 22 U/L (ref 15–41)
Albumin: 4 g/dL (ref 3.5–5.0)
Alkaline Phosphatase: 86 U/L (ref 38–126)
Anion gap: 10 (ref 5–15)
BILIRUBIN TOTAL: 0.8 mg/dL (ref 0.3–1.2)
BUN: 13 mg/dL (ref 6–20)
CO2: 24 mmol/L (ref 22–32)
CREATININE: 0.86 mg/dL (ref 0.44–1.00)
Calcium: 9.1 mg/dL (ref 8.9–10.3)
Chloride: 105 mmol/L (ref 101–111)
Glucose, Bld: 140 mg/dL — ABNORMAL HIGH (ref 65–99)
POTASSIUM: 3.4 mmol/L — AB (ref 3.5–5.1)
Sodium: 139 mmol/L (ref 135–145)
TOTAL PROTEIN: 6.9 g/dL (ref 6.5–8.1)

## 2016-09-26 LAB — I-STAT CG4 LACTIC ACID, ED
LACTIC ACID, VENOUS: 3.29 mmol/L — AB (ref 0.5–1.9)
LACTIC ACID, VENOUS: 4.68 mmol/L — AB (ref 0.5–1.9)

## 2016-09-26 LAB — STREP PNEUMONIAE URINARY ANTIGEN: STREP PNEUMO URINARY ANTIGEN: NEGATIVE

## 2016-09-26 LAB — EXPECTORATED SPUTUM ASSESSMENT W GRAM STAIN, RFLX TO RESP C

## 2016-09-26 LAB — GLUCOSE, CAPILLARY: Glucose-Capillary: 259 mg/dL — ABNORMAL HIGH (ref 65–99)

## 2016-09-26 LAB — TROPONIN I

## 2016-09-26 LAB — I-STAT TROPONIN, ED: Troponin i, poc: 0 ng/mL (ref 0.00–0.08)

## 2016-09-26 LAB — MRSA PCR SCREENING: MRSA by PCR: NEGATIVE

## 2016-09-26 LAB — BRAIN NATRIURETIC PEPTIDE: B NATRIURETIC PEPTIDE 5: 47.4 pg/mL (ref 0.0–100.0)

## 2016-09-26 MED ORDER — IPRATROPIUM-ALBUTEROL 0.5-2.5 (3) MG/3ML IN SOLN
3.0000 mL | Freq: Once | RESPIRATORY_TRACT | Status: AC
  Administered 2016-09-26: 3 mL via RESPIRATORY_TRACT

## 2016-09-26 MED ORDER — SODIUM CHLORIDE 0.9 % IV BOLUS (SEPSIS)
500.0000 mL | Freq: Once | INTRAVENOUS | Status: AC
  Administered 2016-09-26: 500 mL via INTRAVENOUS

## 2016-09-26 MED ORDER — HYDROCODONE-ACETAMINOPHEN 5-325 MG PO TABS
1.0000 | ORAL_TABLET | ORAL | Status: DC | PRN
  Administered 2016-09-26 – 2016-09-28 (×6): 1 via ORAL
  Filled 2016-09-26 (×6): qty 1

## 2016-09-26 MED ORDER — SODIUM CHLORIDE 0.9 % IV SOLN
INTRAVENOUS | Status: AC
  Administered 2016-09-26: 17:00:00 via INTRAVENOUS

## 2016-09-26 MED ORDER — IPRATROPIUM BROMIDE 0.02 % IN SOLN
1.0000 mg | Freq: Once | RESPIRATORY_TRACT | Status: AC
  Administered 2016-09-26: 0.5 mg via RESPIRATORY_TRACT
  Filled 2016-09-26: qty 5

## 2016-09-26 MED ORDER — GUAIFENESIN-CODEINE 100-10 MG/5ML PO SOLN
10.0000 mL | Freq: Once | ORAL | Status: AC
  Administered 2016-09-26: 10 mL via ORAL
  Filled 2016-09-26: qty 10

## 2016-09-26 MED ORDER — ALBUTEROL SULFATE (2.5 MG/3ML) 0.083% IN NEBU
INHALATION_SOLUTION | RESPIRATORY_TRACT | Status: AC
  Filled 2016-09-26: qty 6

## 2016-09-26 MED ORDER — ALPRAZOLAM 0.25 MG PO TABS
0.2500 mg | ORAL_TABLET | Freq: Two times a day (BID) | ORAL | Status: DC | PRN
  Administered 2016-09-26 – 2016-09-29 (×6): 0.25 mg via ORAL
  Filled 2016-09-26 (×6): qty 1

## 2016-09-26 MED ORDER — AZITHROMYCIN 500 MG PO TABS
500.0000 mg | ORAL_TABLET | ORAL | Status: DC
  Administered 2016-09-27 – 2016-09-28 (×2): 500 mg via ORAL
  Filled 2016-09-26 (×3): qty 1

## 2016-09-26 MED ORDER — DILTIAZEM HCL ER COATED BEADS 120 MG PO CP24: 120.0000 mg | ORAL_CAPSULE | Freq: Every day | ORAL | Status: DC

## 2016-09-26 MED ORDER — SODIUM CHLORIDE 0.9 % IV BOLUS (SEPSIS)
1000.0000 mL | Freq: Once | INTRAVENOUS | Status: AC
  Administered 2016-09-26: 1000 mL via INTRAVENOUS

## 2016-09-26 MED ORDER — APIXABAN 5 MG PO TABS
5.0000 mg | ORAL_TABLET | Freq: Two times a day (BID) | ORAL | Status: DC
  Administered 2016-09-26 – 2016-09-29 (×6): 5 mg via ORAL
  Filled 2016-09-26 (×6): qty 1

## 2016-09-26 MED ORDER — ONDANSETRON HCL 4 MG/2ML IJ SOLN: 4.0000 mg | Freq: Four times a day (QID) | INTRAMUSCULAR | Status: DC | PRN

## 2016-09-26 MED ORDER — DILTIAZEM HCL ER COATED BEADS 120 MG PO CP24
120.0000 mg | ORAL_CAPSULE | Freq: Every day | ORAL | Status: DC
  Administered 2016-09-27 – 2016-09-28 (×2): 120 mg via ORAL
  Filled 2016-09-26 (×3): qty 1

## 2016-09-26 MED ORDER — CEFTRIAXONE SODIUM 1 G IJ SOLR
1.0000 g | Freq: Once | INTRAMUSCULAR | Status: AC
  Administered 2016-09-26: 1 g via INTRAVENOUS
  Filled 2016-09-26: qty 10

## 2016-09-26 MED ORDER — DEXTROSE 5 % IV SOLN
500.0000 mg | Freq: Once | INTRAVENOUS | Status: AC
  Administered 2016-09-26: 500 mg via INTRAVENOUS
  Filled 2016-09-26: qty 500

## 2016-09-26 MED ORDER — METHYLPREDNISOLONE SODIUM SUCC 125 MG IJ SOLR
125.0000 mg | Freq: Once | INTRAMUSCULAR | Status: AC
  Administered 2016-09-26: 125 mg via INTRAVENOUS
  Filled 2016-09-26: qty 2

## 2016-09-26 MED ORDER — TRAZODONE HCL 50 MG PO TABS
25.0000 mg | ORAL_TABLET | Freq: Every evening | ORAL | Status: DC | PRN
Start: 2016-09-26 — End: 2016-09-29

## 2016-09-26 MED ORDER — METHYLPREDNISOLONE SODIUM SUCC 125 MG IJ SOLR
60.0000 mg | Freq: Four times a day (QID) | INTRAMUSCULAR | Status: DC
  Administered 2016-09-26 – 2016-09-27 (×3): 60 mg via INTRAVENOUS
  Filled 2016-09-26 (×3): qty 2

## 2016-09-26 MED ORDER — LORAZEPAM 2 MG/ML IJ SOLN
0.5000 mg | Freq: Once | INTRAMUSCULAR | Status: AC
  Administered 2016-09-26: 0.5 mg via INTRAVENOUS
  Filled 2016-09-26: qty 1

## 2016-09-26 MED ORDER — ONDANSETRON HCL 4 MG PO TABS: 4.0000 mg | ORAL_TABLET | Freq: Four times a day (QID) | ORAL | Status: DC | PRN

## 2016-09-26 MED ORDER — ALBUTEROL SULFATE (2.5 MG/3ML) 0.083% IN NEBU
5.0000 mg | INHALATION_SOLUTION | Freq: Once | RESPIRATORY_TRACT | Status: AC
  Administered 2016-09-26: 5 mg via RESPIRATORY_TRACT

## 2016-09-26 MED ORDER — ACETAMINOPHEN 325 MG PO TABS: 650.0000 mg | ORAL_TABLET | Freq: Four times a day (QID) | ORAL | Status: DC | PRN

## 2016-09-26 MED ORDER — ACETAMINOPHEN 650 MG RE SUPP: 650.0000 mg | Freq: Four times a day (QID) | RECTAL | Status: DC | PRN

## 2016-09-26 MED ORDER — IPRATROPIUM-ALBUTEROL 0.5-2.5 (3) MG/3ML IN SOLN
3.0000 mL | RESPIRATORY_TRACT | Status: DC
  Administered 2016-09-26 – 2016-09-27 (×4): 3 mL via RESPIRATORY_TRACT
  Filled 2016-09-26 (×4): qty 3

## 2016-09-26 MED ORDER — IPRATROPIUM-ALBUTEROL 0.5-2.5 (3) MG/3ML IN SOLN
RESPIRATORY_TRACT | Status: AC
  Administered 2016-09-26: 3 mL via RESPIRATORY_TRACT
  Filled 2016-09-26: qty 3

## 2016-09-26 MED ORDER — SODIUM CHLORIDE 0.9 % IV SOLN
INTRAVENOUS | Status: DC
  Administered 2016-09-26: 23:00:00 via INTRAVENOUS

## 2016-09-26 MED ORDER — DEXTROSE 5 % IV SOLN
1.0000 g | INTRAVENOUS | Status: DC
  Administered 2016-09-27 – 2016-09-28 (×2): 1 g via INTRAVENOUS
  Filled 2016-09-26 (×3): qty 10

## 2016-09-26 MED ORDER — SODIUM CHLORIDE 0.9% FLUSH
3.0000 mL | Freq: Two times a day (BID) | INTRAVENOUS | Status: DC
  Administered 2016-09-27 – 2016-09-29 (×5): 3 mL via INTRAVENOUS

## 2016-09-26 MED ORDER — GUAIFENESIN-DM 100-10 MG/5ML PO SYRP
5.0000 mL | ORAL_SOLUTION | ORAL | Status: DC | PRN
  Administered 2016-09-26 – 2016-09-29 (×5): 5 mL via ORAL
  Filled 2016-09-26 (×5): qty 5

## 2016-09-26 MED ORDER — ALBUTEROL (5 MG/ML) CONTINUOUS INHALATION SOLN
10.0000 mg/h | INHALATION_SOLUTION | RESPIRATORY_TRACT | Status: AC
  Administered 2016-09-26: 10 mg/h via RESPIRATORY_TRACT
  Filled 2016-09-26: qty 20

## 2016-09-26 MED ORDER — METOPROLOL TARTRATE 5 MG/5ML IV SOLN
5.0000 mg | Freq: Four times a day (QID) | INTRAVENOUS | Status: DC
  Administered 2016-09-26 – 2016-09-27 (×3): 5 mg via INTRAVENOUS
  Filled 2016-09-26 (×3): qty 5

## 2016-09-26 NOTE — H&P (Signed)
History and Physical    [name] QJJ:[id number] DOB: [birth year] DOA: 09/26/2016  PCP: Jani Gravel, MD Patient coming from: home  Chief Complaint: sob/cough  HPI: [name] is a pleasant 71 y.o. female with medical history significant A. fib on chronic anticoagulation, chronic diastolic heart failure, hypertension, obesity, since emergency Department chief complaint worsening shortness of breath. Initial evaluation reveals acute respiratory distress with a chest x-ray concerning for pneumonia, elevated lactic acid mild leukocytosis  Information is obtained from the patient with as few words as possible and the chart. She reports 10 days ago she was diagnosed by primary care provider with influenza. She was given antibiotics but was not given Tamiflu. She states in the several days after that she began to feel better. She reports that during that visit he prescribed albuterol inhaler as well. She states over the last 2 days her cough and gradual worsening shortness of breath came back. Around 1:30 she reports developing severe shortness of breath with wheezing. She used her new inhalers with little relief. She denies chest pain palpitations lower extremity edema headache dizziness syncope or near-syncope. She denies any diaphoresis nausea vomiting abdominal pain dysuria hematuria frequency or urgency. She denies diarrhea constipation melena bright red blood per rectum. She also reports no history of asthma COPD or smoking. She has never had pulmonary function test.. She does report 2 years ago she had similar symptoms and was diagnosed with A. fib with RVR and heart failure   ED Course: In the emergency department she is afebrile hemodynamically stable with tachypnea oxygen saturation levels 91% on 3 L nasal cannula. He is provided with 3 L normal saline nebulizer 225 mg of Solu-Medrol, azithromycin and Rocephin  Review of Systems: As per HPI otherwise 10 point review of systems negative.     Ambulatory Status: Ambulates independently his independent with ADLs  Past Medical History:  Diagnosis Date  . Arthritis    both knees  . Carpal tunnel syndrome, bilateral    both hands go to sleep at times from elbows down  . DDD (degenerative disc disease), lumbosacral   . First degree AV block   . History of kidney stones   . PONV (postoperative nausea and vomiting)   . Postlaminectomy syndrome   . Renal calculi    BILATERAL  . Right ureteral stone     Past Surgical History:  Procedure Laterality Date  . ANTERIOR CERVICAL DECOMP/DISCECTOMY FUSION  03-18-2011   C3 -- C7  . APPENDECTOMY    . CARDIAC CATHETERIZATION N/A 01/16/2015   Procedure: Left Heart Cath and Coronary Angiography;  Surgeon: Belva Crome, MD;  Location: [facility];  Service: Cardiovascular;  Laterality: N/A;  . CHOLECYSTECTOMY OPEN  1970'S  . CYSTOSCOPY WITH RETROGRADE PYELOGRAM, URETEROSCOPY AND STENT PLACEMENT N/A 02/13/2013   Procedure: CYSTOSCOPY WITH RETROGRADE PYELOGRAM, URETEROSCOPY AND LITHOTRIPSY ;  Surgeon: Claybon Jabs, MD;  Location: [facility];  Service: Urology;  Laterality: N/A;  . EXTRACORPOREAL SHOCK WAVE LITHOTRIPSY Right 05-16-2012  . HOLMIUM LASER APPLICATION Right 10/19/1854   Procedure: HOLMIUM LASER APPLICATION;  Surgeon: Claybon Jabs, MD;  Location: [facility];  Service: Urology;  Laterality: Right;  . KNEE ARTHROSCOPY Bilateral   . LUMBAR LAMINECTOMY  X2  1960'S  . NASAL SEPTUM SURGERY  1970'S  . SHOULDER OPEN ROTATOR CUFF REPAIR Bilateral LEFT 01-11-2001/   RIGHT 07-21-2002  . TOTAL KNEE ARTHROPLASTY  04/13/2012   Procedure: TOTAL KNEE ARTHROPLASTY;  Surgeon:  Gearlean Alf, MD;  Location: [facility];  Service: Orthopedics;  Laterality: Left;  . TOTAL KNEE ARTHROPLASTY  08/08/2012   Procedure: TOTAL KNEE ARTHROPLASTY;  Surgeon: Gearlean Alf, MD;  Location: [facility];  Service: Orthopedics;  Laterality: Right;  . TRANSTHORACIC ECHOCARDIOGRAM   03-29-2011   MODERATE LVH/ EF 60-65%/ MILD MR  . VAGINAL HYSTERECTOMY  1960'S    Social History   Social History  . Marital status: Married    Spouse name: N/A  . Number of children: N/A  . Years of education: N/A   Occupational History  . Not on file.   Social History Main Topics  . Smoking status: Never Smoker  . Smokeless tobacco: Never Used  . Alcohol use No  . Drug use: No  . Sexual activity: Not on file   Other Topics Concern  . Not on file   Social History Narrative  . No narrative on file    No Known Allergies  Family History  Problem Relation Age of Onset  . Diabetes type II Mother   . Diabetes Sister   . Hypertension Sister   . Lung cancer Father   . Hypertension Brother   . Diabetes Brother   . Heart disease Brother   . Diabetes Brother   . Hypertension Brother   . Heart disease Brother     Prior to Admission medications   Medication Sig Start Date End Date Taking? Authorizing Provider  acetaminophen (TYLENOL) 325 MG tablet Take 650 mg by mouth every 6 (six) hours as needed for mild pain.   Yes Historical Provider, MD  albuterol (PROVENTIL HFA;VENTOLIN HFA) 108 (90 Base) MCG/ACT inhaler Inhale 1 puff into the lungs every 6 (six) hours as needed for wheezing or shortness of breath.   Yes Historical Provider, MD  apixaban (ELIQUIS) 5 MG TABS tablet Take 1 tablet (5 mg total) by mouth 2 (two) times daily. 09/14/16  Yes Belva Crome, MD  chlorpheniramine-HYDROcodone [facility] PENNKINETIC ER) 10-8 MG/5ML SUER Take 5 mLs by mouth every 12 (twelve) hours as needed for cough.   Yes Historical Provider, MD  diltiazem (CARTIA XT) 120 MG 24 hr capsule Take 1 capsule (120 mg total) by mouth daily. 01/31/16  Yes Belva Crome, MD    Physical Exam: Vitals:   09/26/16 1030 09/26/16 1045 09/26/16 1115 09/26/16 1222  BP: (!) 148/94 (!) 152/69 132/66   Pulse: (!) 57 91 87   Resp: (!) 24 (!) 22 (!) 23   Temp:      TempSrc:      SpO2: 95% 91% 90% 95%      General:  Appears Right anxious obese sitting up in bed moderate distress Eyes:  PERRL, EOMI, normal lids, iris ENT:  grossly normal hearing, lips & tongue, his membranes of her mouth are pink and slightly dry Neck:  no LAD, masses or thyromegaly Cardiovascular:  RRR, no m/r/g. No LE edema.  Respiratory:  Moderate to severe increased work of breathing. Using abdominal accessory muscles. Unable to complete sentences. Frequent nonproductive cough during exam. Breath sounds are quite diminished diffuse wheezes bilaterally hear no crackles Abdomen:  soft, ntnd, obese positive bowel sounds throughout no guarding or rebounding Skin:  no rash or induration seen on limited exam Musculoskeletal:  grossly normal tone BUE/BLE, good ROM, no bony abnormality Psychiatric:  grossly normal mood and affect, speech fluent and appropriate, AOx3 Neurologic:  CN 2-12 grossly intact, moves all extremities in coordinated fashion, sensation intact speech clear facial symmetry  Labs on Admission: I have personally reviewed following labs and imaging studies  CBC:  Recent Labs Lab 09/26/16 1003  WBC 10.8*  NEUTROABS 6.9  HGB 13.1  HCT 39.9  MCV 89.5  PLT 540   Basic Metabolic Panel:  Recent Labs Lab 09/26/16 1003  NA 139  K 3.4*  CL 105  CO2 24  GLUCOSE 140*  BUN 13  CREATININE 0.86  CALCIUM 9.1   GFR: CrCl cannot be calculated (Unknown ideal weight.). Liver Function Tests:  Recent Labs Lab 09/26/16 1003  AST 22  ALT 18  ALKPHOS 86  BILITOT 0.8  PROT 6.9  ALBUMIN 4.0   No results for input(s): LIPASE, AMYLASE in the last 168 hours. No results for input(s): AMMONIA in the last 168 hours. Coagulation Profile: No results for input(s): INR, PROTIME in the last 168 hours. Cardiac Enzymes: No results for input(s): CKTOTAL, CKMB, CKMBINDEX, TROPONINI in the last 168 hours. BNP (last 3 results) No results for input(s): PROBNP in the last 8760 hours. HbA1C: No results for  input(s): HGBA1C in the last 72 hours. CBG: No results for input(s): GLUCAP in the last 168 hours. Lipid Profile: No results for input(s): CHOL, HDL, LDLCALC, TRIG, CHOLHDL, LDLDIRECT in the last 72 hours. Thyroid Function Tests: No results for input(s): TSH, T4TOTAL, FREET4, T3FREE, THYROIDAB in the last 72 hours. Anemia Panel: No results for input(s): VITAMINB12, FOLATE, FERRITIN, TIBC, IRON, RETICCTPCT in the last 72 hours. Urine analysis:    Component Value Date/Time   COLORURINE YELLOW 07/29/2012 1128   APPEARANCEUR CLEAR 07/29/2012 1128   LABSPEC 1.022 07/29/2012 1128   PHURINE 5.5 07/29/2012 1128   GLUCOSEU NEGATIVE 07/29/2012 1128   HGBUR SMALL (A) 07/29/2012 1128   BILIRUBINUR NEGATIVE 07/29/2012 1128   KETONESUR NEGATIVE 07/29/2012 1128   PROTEINUR NEGATIVE 07/29/2012 1128   UROBILINOGEN 0.2 07/29/2012 1128   NITRITE NEGATIVE 07/29/2012 1128   LEUKOCYTESUR SMALL (A) 07/29/2012 1128    Creatinine Clearance: CrCl cannot be calculated (Unknown ideal weight.).  Sepsis Labs: @LABRCNTIP (procalcitonin:4,lacticidven:4) )No results found for this or any previous visit (from the past 240 hour(s)).   Radiological Exams on Admission: Dg Chest Portable 1 View  Result Date: 09/26/2016 CLINICAL DATA:  Shortness of breath and cough over the last 10 days. EXAM: PORTABLE CHEST 1 VIEW COMPARISON:  05/02/2014 FINDINGS: Heart size is normal. Mediastinal shadows are normal. There is central bronchial thickening but no infiltrate, collapse or effusion. No acute bone finding. Postoperative changes of the cervical spine and both shoulders. IMPRESSION: Possible bronchitis.  No infiltrate, collapse or effusion. Electronically Signed   By: Nelson Chimes M.D.   On: 09/26/2016 10:26    EKG: Independently reviewed. Sinus rhythm Nonspecific intraventricular conduction delay Borderline T abnormalities, anterior leads  Assessment/Plan Principal Problem:   Acute respiratory distress Active  Problems:   Chronic diastolic heart failure (HCC)   Obesity   Essential hypertension   Paroxysmal atrial fibrillation (HCC)   Chronic anticoagulation   CAP (community acquired pneumonia)   Elevated lactic acid level   Anxiety   #1. Acute respiratory distress likely related to acute bronchitis in the setting of possible pneumonia with reactive airway disease and anxiety. ABG is reassuring but patient's respiratory effort remains increased at point of admission. Chest x-ray with possible bronchitis no infiltrate collapse or effusion. She does have a mild leukocytosis and elevated lactic acid with tachypnea. Initial troponin negative. She is provided with vigorous IV fluids antibiotics for community-acquired pneumonia and nebulizers as well as Solu-Medrol in  the emergency department. She also received Ativan. Only slight improvement at the point of admission -Admit to step down -Scheduled nebulizers -Solu-Medrol -Flutter valve -anti-tussive -Oxygen supplementation -Continue antibiotics initiated in the emergency department -continue anti-anxiety agents as needed -cycle troponin -Would likely benefit from outpatient pulmonary function tests  #2. Community-acquired pneumonia with reactive airway disease. Chest x-ray as noted above. See #1 -Follow blood cultures -Obtain sputum culture as able -Strep pneumo urine antigen -Rocephin and azithromycin  #3. A. fib.Mali score 4. Home medications include eliquis. EKG as noted above. -Continue Cardizem -continue eliquis -Monitor  #4. Chronic diastolic heart failure. Appears compensated.  Cath in 2016 with an EF of 65%. Normal left ventricular systolic function -Daily weights -Intake and output  #5. Hypertension. Fair control in the emergency department. Meds include Cardizem -Monitor closely -Continue home meds  #6. Elevated lactic acid. Mild leukocytosis. Patient is afebrile hemodynamically stable. Non-toxic appearing. She received 3L NS  in ED. -Continue IV fluids -follow lactic acid -antibiotics as noted above  #7. Anxiety. No hx of same. Related to #1. Complicating #1.  -offer reassurance frequently -keep updated on condition -encourage slow breathing - low dose xanax prn -monitor    DVT prophylaxis: eliquis Code Status: full  Family Communication: husband at bedside  Disposition Plan: home when ready  Consults called: none  Admission status: obs   Radene Gunning MD [facility]  If 7PM-7AM, please contact night-coverage [email or web address] Password [email or web address]  09/26/2016, 1:18 PM

## 2016-09-26 NOTE — ED Notes (Signed)
Lanny Hurst NT states he was unable to get a BP due to pt continuing to cough and move around in the wheelchair.

## 2016-09-26 NOTE — ED Notes (Signed)
Care handoff to Phillis Haggis, RN

## 2016-09-26 NOTE — Progress Notes (Signed)
CRITICAL VALUE ALERT  Critical value received: Lactic acid 7.6  Date of notification: 09/26/2016  Time of notification:  1930  Critical value read back:yes  Nurse who received alert:  Stefanie Libel  MD notified (1st page):  Raliegh Ip Schorr  Time of first page:  1934  Responding MD:  Lamar Blinks  Time MD responded:  [phone]

## 2016-09-26 NOTE — ED Triage Notes (Signed)
Pt reports she has had the flu for 10 days and states SOB that worsened this morning at 1am. Audible wheezing at triage with a cough. Pt speaking short sentences. Denies CP

## 2016-09-26 NOTE — ED Notes (Signed)
Pt placed on 2L O2 via nasal cannula, pt reports SOB and sats were 90-91% rm air.

## 2016-09-26 NOTE — ED Provider Notes (Signed)
[place] DEPT Provider Note   CSN: [id number] Arrival date & time: 09/26/16  0932     History   Chief Complaint Chief Complaint  Patient presents with  . Shortness of Breath    HPI [name] is a 71 y.o. female.  HPI   71 year old female with a history of atrial fibrillation on eliquis, prior hospitalization with atrial fibrillation and CHF, hypertension presents with concern for cough and shortness of breath. Patient was seen by her doctor and diagnosed with the flu 10 days ago, with dyspnea increasing at 1:30 AM.  Reports that she saw her primary care physician and was prescribed an albuterol inhaler, as well as an antibiotic for the flu. Reports that over the last 10 days, her cough had initially began to improve, however significantly worsened in the last day. Last night around 1:30 AM, she developed severe dyspnea and wheezing. Reports that her home albuterol inhalers were not helping. Denies any leg pain or swelling. Takes eliquis, and denies missing any doses. Otherwise has had no recent surgeries, or recent long distance travel or immobilization.  Denies a history of asthma, COPD or smoking, however reports that her doctor has given her albuterol inhalers for bronchitis in the past.  Reports that she had similar symptoms a year or 2 ago, at which point she was in atrial fibrillation with RVR and congestive heart failure secondary to atrial fibrillation. Denies other episodes of congestive heart failure. Reports she was in the ICU for this prior episode. Denies any chest pain.   Past Medical History:  Diagnosis Date  . Arthritis    both knees  . Carpal tunnel syndrome, bilateral    both hands go to sleep at times from elbows down  . DDD (degenerative disc disease), lumbosacral   . First degree AV block   . History of kidney stones   . PONV (postoperative nausea and vomiting)   . Postlaminectomy syndrome   . Renal calculi    BILATERAL  . Right ureteral stone       Patient Active Problem List   Diagnosis Date Noted  . Chest pain 01/16/2015  . Abnormal myocardial perfusion study 01/16/2015  . Paroxysmal atrial fibrillation ([employer]) 12/20/2014  . Chronic anticoagulation 12/20/2014  . Essential hypertension 07/20/2014  . Obesity 05/04/2014  . Prediabetes 05/04/2014  . SIRS (systemic inflammatory response syndrome) ([place]) 05/04/2014  . Chronic diastolic heart failure ([place]) 05/03/2014  . Shortness of breath 05/02/2014  . Ureteral calculus, right 05/16/2012  . Hydronephrosis of right kidney 05/16/2012  . Renal calculus, left 05/16/2012  . Hematuria 04/19/2012  . Postop Transfusion 04/15/2012  . Postop Acute blood loss anemia 04/14/2012  . Postop Hypokalemia 04/14/2012  . OA (osteoarthritis) of knee 04/13/2012    Past Surgical History:  Procedure Laterality Date  . ANTERIOR CERVICAL DECOMP/DISCECTOMY FUSION  03-18-2011   C3 -- C7  . APPENDECTOMY    . CARDIAC CATHETERIZATION N/A 01/16/2015   Procedure: Left Heart Cath and Coronary Angiography;  Surgeon: Belva Crome, MD;  Location: [facility];  Service: Cardiovascular;  Laterality: N/A;  . CHOLECYSTECTOMY OPEN  1970'S  . CYSTOSCOPY WITH RETROGRADE PYELOGRAM, URETEROSCOPY AND STENT PLACEMENT N/A 02/13/2013   Procedure: CYSTOSCOPY WITH RETROGRADE PYELOGRAM, URETEROSCOPY AND LITHOTRIPSY ;  Surgeon: Claybon Jabs, MD;  [facility];  Service: Urology;  Laterality: N/A;  . EXTRACORPOREAL SHOCK WAVE LITHOTRIPSY Right 05-16-2012  . HOLMIUM LASER APPLICATION Right 09/14/3612   Procedure: HOLMIUM LASER APPLICATION;  Surgeon:  Claybon Jabs, MD;  Location: [facility];  Service: Urology;  Laterality: Right;  . KNEE ARTHROSCOPY Bilateral   . LUMBAR LAMINECTOMY  X2  1960'S  . NASAL SEPTUM SURGERY  1970'S  . SHOULDER OPEN ROTATOR CUFF REPAIR Bilateral LEFT 01-11-2001/   RIGHT 07-21-2002  . TOTAL KNEE ARTHROPLASTY  04/13/2012   Procedure: TOTAL KNEE ARTHROPLASTY;   Surgeon: Gearlean Alf, MD;  Location: [facility];  Service: Orthopedics;  Laterality: Left;  . TOTAL KNEE ARTHROPLASTY  08/08/2012   Procedure: TOTAL KNEE ARTHROPLASTY;  Surgeon: Gearlean Alf, MD;  Location: [facility];  Service: Orthopedics;  Laterality: Right;  . TRANSTHORACIC ECHOCARDIOGRAM  03-29-2011   MODERATE LVH/ EF 60-65%/ MILD MR  . VAGINAL HYSTERECTOMY  1960'S    OB History    No data available       Home Medications    Prior to Admission medications   Medication Sig Start Date End Date Taking? Authorizing Provider  acetaminophen (TYLENOL) 325 MG tablet Take 650 mg by mouth every 6 (six) hours as needed for mild pain.   Yes Historical Provider, MD  albuterol (PROVENTIL HFA;VENTOLIN HFA) 108 (90 Base) MCG/ACT inhaler Inhale 1 puff into the lungs every 6 (six) hours as needed for wheezing or shortness of breath.   Yes Historical Provider, MD  apixaban (ELIQUIS) 5 MG TABS tablet Take 1 tablet (5 mg total) by mouth 2 (two) times daily. 09/14/16  Yes Belva Crome, MD  chlorpheniramine-HYDROcodone [facility] PENNKINETIC ER) 10-8 MG/5ML SUER Take 5 mLs by mouth every 12 (twelve) hours as needed for cough.   Yes Historical Provider, MD  diltiazem (CARTIA XT) 120 MG 24 hr capsule Take 1 capsule (120 mg total) by mouth daily. 01/31/16  Yes Belva Crome, MD    Family History Family History  Problem Relation Age of Onset  . Diabetes type II Mother   . Diabetes Sister   . Hypertension Sister   . Lung cancer Father   . Hypertension Brother   . Diabetes Brother   . Heart disease Brother   . Diabetes Brother   . Hypertension Brother   . Heart disease Brother     Social History Social History  Substance Use Topics  . Smoking status: Never Smoker  . Smokeless tobacco: Never Used  . Alcohol use No     Allergies   Patient has no known allergies.   Review of Systems Review of Systems  Constitutional: Positive for fatigue. Negative for fever.  HENT: Negative for sore throat.    Eyes: Negative for visual disturbance.  Respiratory: Positive for cough and shortness of breath.   Cardiovascular: Negative for chest pain and leg swelling.  Gastrointestinal: Negative for abdominal pain, nausea and vomiting.  Genitourinary: Negative for difficulty urinating.  Musculoskeletal: Negative for back pain and neck pain.  Skin: Negative for rash.  Neurological: Negative for syncope and headaches.     Physical Exam Updated Vital Signs BP 132/66   Pulse 87   Temp 98.3 F (36.8 C) (Oral)   Resp (!) 23   SpO2 90%   Physical Exam  Constitutional: She is oriented to person, place, and time. She appears well-developed and well-nourished. No distress.  HENT:  Head: Normocephalic and atraumatic.  Eyes: Conjunctivae and EOM are normal.  Neck: Normal range of motion.  Cardiovascular: Normal rate, regular rhythm, normal heart sounds and intact distal pulses.  Exam reveals no gallop and no friction rub.   No murmur heard. Pulmonary/Chest: Tachypnea  noted. She has wheezes (diffuse). She has no rales.  Abdominal: Soft. She exhibits no distension. There is no tenderness. There is no guarding.  Musculoskeletal: She exhibits no edema or tenderness.  Neurological: She is alert and oriented to person, place, and time.  Skin: Skin is warm and dry. No rash noted. She is not diaphoretic. No erythema.  Nursing note and vitals reviewed.    ED Treatments / Results  Labs (all labs ordered are listed, but only abnormal results are displayed) Labs Reviewed  CBC WITH DIFFERENTIAL/PLATELET - Abnormal; Notable for the following:       Result Value   WBC 10.8 (*)    All other components within normal limits  COMPREHENSIVE METABOLIC PANEL - Abnormal; Notable for the following:    Potassium 3.4 (*)    Glucose, Bld 140 (*)    All other components within normal limits  I-STAT CG4 LACTIC ACID, ED - Abnormal; Notable for the following:    Lactic Acid, Venous 3.29 (*)    All other components  within normal limits  CULTURE, BLOOD (ROUTINE X 2)  CULTURE, BLOOD (ROUTINE X 2)  BRAIN NATRIURETIC PEPTIDE  I-STAT TROPOININ, ED    EKG  EKG Interpretation  Date/Time:  Saturday September 26 2016 09:39:31 EDT Ventricular Rate:  110 PR Interval:    QRS Duration: 78 QT Interval:  442 QTC Calculation: 598 R Axis:   90 Text Interpretation:  Interpretation limited secondary to artifact Suspect sinus tachycardia, however difficult to rule out atrial fibrillation given quality of ECG Abnormal ECG Confirmed by [facility] MD, Junie Panning ([id number]) on 09/26/2016 10:57:35 AM       Radiology Dg Chest Portable 1 View  Result Date: 09/26/2016 CLINICAL DATA:  Shortness of breath and cough over the last 10 days. EXAM: PORTABLE CHEST 1 VIEW COMPARISON:  05/02/2014 FINDINGS: Heart size is normal. Mediastinal shadows are normal. There is central bronchial thickening but no infiltrate, collapse or effusion. No acute bone finding. Postoperative changes of the cervical spine and both shoulders. IMPRESSION: Possible bronchitis.  No infiltrate, collapse or effusion. Electronically Signed   By: Nelson Chimes M.D.   On: 09/26/2016 10:26    Procedures Procedures (including critical care time)  Medications Ordered in ED Medications  azithromycin (ZITHROMAX) 500 mg in dextrose 5 % 250 mL IVPB (500 mg Intravenous New Bag/Given 09/26/16 1130)  albuterol (PROVENTIL,VENTOLIN) solution continuous neb (not administered)  ipratropium (ATROVENT) nebulizer solution 1 mg (not administered)  albuterol (PROVENTIL) (2.5 MG/3ML) 0.083% nebulizer solution 5 mg ( Nebulization Canceled Entry 09/26/16 0955)  sodium chloride 0.9 % bolus 1,000 mL (1,000 mLs Intravenous New Bag/Given 09/26/16 1151)  cefTRIAXone (ROCEPHIN) 1 g in dextrose 5 % 50 mL IVPB (0 g Intravenous Stopped 09/26/16 1127)  sodium chloride 0.9 % bolus 1,000 mL (0 mLs Intravenous Stopped 09/26/16 1127)  ipratropium-albuterol (DUONEB) 0.5-2.5 (3) MG/3ML nebulizer solution 3 mL  (3 mLs Nebulization Given 09/26/16 1040)  sodium chloride 0.9 % bolus 1,000 mL (1,000 mLs Intravenous New Bag/Given 09/26/16 1132)  methylPREDNISolone sodium succinate (SOLU-MEDROL) 125 mg/2 mL injection 125 mg (125 mg Intravenous Given 09/26/16 1131)  guaiFENesin-codeine 100-10 MG/5ML solution 10 mL (10 mLs Oral Given 09/26/16 1150)  LORazepam (ATIVAN) injection 0.5 mg (0.5 mg Intravenous Given 09/26/16 1211)   CRITICAL CARE: reactive airway disease requiring continuous albuterol Performed by: Alvino Chapel   Total critical care time: 30 minutes  Critical care time was exclusive of separately billable procedures and treating other patients.  Critical care was necessary to  treat or prevent imminent or life-threatening deterioration.  Critical care was time spent personally by me on the following activities: development of treatment plan with patient and/or surrogate as well as nursing, discussions with consultants, evaluation of patient's response to treatment, examination of patient, obtaining history from patient or surrogate, ordering and performing treatments and interventions, ordering and review of laboratory studies, ordering and review of radiographic studies, pulse oximetry and re-evaluation of patient's condition.   Initial Impression / Assessment and Plan / ED Course  I have reviewed the triage vital signs and the nursing notes.  Pertinent labs & imaging results that were available during my care of the patient were reviewed by me and considered in my medical decision making (see chart for details).    71 year old female with a history of atrial fibrillation on eliquis, prior hospitalization with atrial fibrillation and CHF, hypertension presents with concern for cough and shortness of breath. Patient was seen by her doctor and diagnosed with the flu 10 days ago, with dyspnea increasing at 1:30 AM.  Differential diagnosis includes CHF, pneumonia, reactive airway disease  and viral illness. Patient with wheezing on exam, and had been prescribed an inhaler by her PCP, however has no history of known COPD, asthma or smoking. However, for possible reactive airway disease/undiagnosed asthma, she is continued on albuterol nebulizer. Have low suspicion for pulmonary embolus given patient on eliquis, no other known risk factors.  Chest XR shows no clear sign of pneumonia, more consistent with bronchitis, however given haziness on right, no other signs of CHF on labs/XR will treat as pneumonia with reactive airway disease.  Lactic acid elevated, initially unclear if from CHF related hypoperfusion or from pneumonia. XR not clearly showing pneumonia and did not call code sepsis, but given no other signs of CHF at this time, will treat patient for sepsis/pneumonia with 3L NS, rocephin/azithromycin.   Given continued wheezing, concern for additional reactive airway disease and gave 125mg  solumedrol, placed on continuous albuterol, given additional atrovent.  Also given ativan for component of anxiety.  Will admit to hospitalist for continued care of RAD, possible pneumonia.      Final Clinical Impressions(s) / ED Diagnoses   Final diagnoses:  Reactive airway disease with acute exacerbation, unspecified asthma severity, unspecified whether persistent  Community acquired pneumonia, unspecified laterality    New Prescriptions New Prescriptions   No medications on file     Gareth Morgan, MD 09/26/16 1216

## 2016-09-26 NOTE — ED Notes (Signed)
Lactic acid result given to Dr. Billy Fischer

## 2016-09-27 DIAGNOSIS — J189 Pneumonia, unspecified organism: Secondary | ICD-10-CM

## 2016-09-27 DIAGNOSIS — I5032 Chronic diastolic (congestive) heart failure: Secondary | ICD-10-CM

## 2016-09-27 DIAGNOSIS — J9601 Acute respiratory failure with hypoxia: Secondary | ICD-10-CM | POA: Diagnosis not present

## 2016-09-27 DIAGNOSIS — R739 Hyperglycemia, unspecified: Secondary | ICD-10-CM | POA: Diagnosis not present

## 2016-09-27 DIAGNOSIS — Z7901 Long term (current) use of anticoagulants: Secondary | ICD-10-CM | POA: Diagnosis not present

## 2016-09-27 DIAGNOSIS — D72829 Elevated white blood cell count, unspecified: Secondary | ICD-10-CM | POA: Diagnosis not present

## 2016-09-27 DIAGNOSIS — F419 Anxiety disorder, unspecified: Secondary | ICD-10-CM

## 2016-09-27 DIAGNOSIS — I480 Paroxysmal atrial fibrillation: Secondary | ICD-10-CM | POA: Diagnosis not present

## 2016-09-27 DIAGNOSIS — Z6841 Body Mass Index (BMI) 40.0 and over, adult: Secondary | ICD-10-CM | POA: Diagnosis not present

## 2016-09-27 DIAGNOSIS — F064 Anxiety disorder due to known physiological condition: Secondary | ICD-10-CM | POA: Diagnosis not present

## 2016-09-27 DIAGNOSIS — J209 Acute bronchitis, unspecified: Secondary | ICD-10-CM | POA: Diagnosis not present

## 2016-09-27 DIAGNOSIS — Z79899 Other long term (current) drug therapy: Secondary | ICD-10-CM | POA: Diagnosis not present

## 2016-09-27 DIAGNOSIS — E669 Obesity, unspecified: Secondary | ICD-10-CM | POA: Diagnosis not present

## 2016-09-27 DIAGNOSIS — R7989 Other specified abnormal findings of blood chemistry: Secondary | ICD-10-CM

## 2016-09-27 DIAGNOSIS — T380X5A Adverse effect of glucocorticoids and synthetic analogues, initial encounter: Secondary | ICD-10-CM | POA: Diagnosis not present

## 2016-09-27 DIAGNOSIS — R0603 Acute respiratory distress: Secondary | ICD-10-CM | POA: Diagnosis not present

## 2016-09-27 DIAGNOSIS — Z96653 Presence of artificial knee joint, bilateral: Secondary | ICD-10-CM | POA: Diagnosis not present

## 2016-09-27 DIAGNOSIS — R0602 Shortness of breath: Secondary | ICD-10-CM | POA: Diagnosis not present

## 2016-09-27 DIAGNOSIS — E872 Acidosis: Secondary | ICD-10-CM | POA: Diagnosis not present

## 2016-09-27 DIAGNOSIS — I482 Chronic atrial fibrillation: Secondary | ICD-10-CM | POA: Diagnosis not present

## 2016-09-27 DIAGNOSIS — Z7951 Long term (current) use of inhaled steroids: Secondary | ICD-10-CM | POA: Diagnosis not present

## 2016-09-27 DIAGNOSIS — I110 Hypertensive heart disease with heart failure: Secondary | ICD-10-CM | POA: Diagnosis not present

## 2016-09-27 LAB — GLUCOSE, CAPILLARY
GLUCOSE-CAPILLARY: 260 mg/dL — AB (ref 65–99)
GLUCOSE-CAPILLARY: 233 mg/dL — AB (ref 65–99)
Glucose-Capillary: 205 mg/dL — ABNORMAL HIGH (ref 65–99)
Glucose-Capillary: 245 mg/dL — ABNORMAL HIGH (ref 65–99)

## 2016-09-27 LAB — BASIC METABOLIC PANEL
ANION GAP: 10 (ref 5–15)
BUN: 14 mg/dL (ref 6–20)
CHLORIDE: 111 mmol/L (ref 101–111)
CO2: 16 mmol/L — AB (ref 22–32)
Calcium: 8.7 mg/dL — ABNORMAL LOW (ref 8.9–10.3)
Creatinine, Ser: 0.71 mg/dL (ref 0.44–1.00)
GFR calc non Af Amer: >60 mL/min (ref >60)
GLUCOSE: 221 mg/dL — AB (ref 65–99)
Potassium: 3.7 mmol/L (ref 3.5–5.1)
Sodium: 137 mmol/L (ref 135–145)

## 2016-09-27 LAB — CBC
HEMATOCRIT: 34.9 % — AB (ref 36.0–46.0)
HEMOGLOBIN: 11.2 g/dL — AB (ref 12.0–15.0)
MCH: 28.9 pg (ref 26.0–34.0)
MCHC: 32.1 g/dL (ref 30.0–36.0)
MCV: 90.2 fL (ref 78.0–100.0)
Platelets: 247 10*3/uL (ref 150–400)
RBC: 3.87 MIL/uL (ref 3.87–5.11)
RDW: 13.6 % (ref 11.5–15.5)
WBC: 16.2 10*3/uL — ABNORMAL HIGH (ref 4.0–10.5)

## 2016-09-27 LAB — LACTIC ACID, PLASMA: Lactic Acid, Venous: 2.2 mmol/L (ref 0.5–1.9)

## 2016-09-27 MED ORDER — ALBUTEROL SULFATE (2.5 MG/3ML) 0.083% IN NEBU: 2.5000 mg | INHALATION_SOLUTION | RESPIRATORY_TRACT | Status: DC | PRN

## 2016-09-27 MED ORDER — METHYLPREDNISOLONE SODIUM SUCC 125 MG IJ SOLR
60.0000 mg | Freq: Two times a day (BID) | INTRAMUSCULAR | Status: AC
  Administered 2016-09-27 – 2016-09-28 (×3): 60 mg via INTRAVENOUS
  Filled 2016-09-27 (×3): qty 2

## 2016-09-27 MED ORDER — IPRATROPIUM-ALBUTEROL 0.5-2.5 (3) MG/3ML IN SOLN
3.0000 mL | Freq: Three times a day (TID) | RESPIRATORY_TRACT | Status: DC
  Administered 2016-09-27 – 2016-09-28 (×4): 3 mL via RESPIRATORY_TRACT
  Filled 2016-09-27 (×4): qty 3

## 2016-09-27 MED ORDER — ALBUTEROL SULFATE (2.5 MG/3ML) 0.083% IN NEBU
2.5000 mg | INHALATION_SOLUTION | RESPIRATORY_TRACT | Status: DC | PRN
Start: 2016-09-27 — End: 2016-09-27

## 2016-09-27 NOTE — Progress Notes (Signed)
[place] TEAM 1 - Stepdown/ICU TEAM  ZIERRA LAROQUE  QQI:[id number] DOB: September 23, 1945 DOA: 09/26/2016 PCP: Jani Gravel, MD    Brief Narrative:  71 y.o. female with a hx of A. Fib on chronic anticoagulation, HTN, obesity, and diastolic heart failure who presented with shortness breath felt to be due to acute bacterial bronchitis that failed outpatient treatment with Levaquin.  Subjective: The patient reports that she feels better overall but is not yet back to her baseline.  She denies chest pain nausea vomiting or abdominal pain.  She admits to significant anxiety and understands that this is worsening her respiratory symptoms as well as her A. fib.  Assessment & Plan:  Acute hypoxic respiratory failure - acute bronchitis v/s PNA  Respiratory virus panel negative - no focal infiltrate noted on initial CXR (will repeat after hydrated) - cont current care and follow   Lactic acidosis Lactate continues to trend down - keep hydrated - follow to normalization   Chronic Afib CHA2DS2 - VASc is 4 - cont eliquis and cardizem - rate controlled w/ volume expansion and control of anxiety   Chronic diastolic CHF Grade 2 per TTE Oct 2015 w/ normal EF on cardiac cath July 2016 - baseline weight approximately 110 kg - follow Is/Os and weights - no signif overload on exam today  Filed Weights   09/26/16 1614 09/27/16 0343  Weight: 104.3 kg (230 lb) 111 kg (244 lb 11.4 oz)    HTN BP trending up - adjust medical tx and follow  Obesity - Body mass index is 44.76 kg/m.   DVT prophylaxis: eliquis Code Status: FULL CODE Family Communication: Spoke with husband at bedside Disposition Plan: Anticipate transfer to telemetry bed 3/19  Consultants:  none  Procedures: none  Antimicrobials:  Ceftriaxone 3/17 > Azithromycin 3/17 >  Objective: Blood pressure (!) 134/56, pulse 81, temperature 98.3 F (36.8 C), temperature source Oral, resp. rate (!) 22, height 5\' 2"  (1.575 m), weight 111 kg (244 lb  11.4 oz), SpO2 100 %.  Intake/Output Summary (Last 24 hours) at 09/27/16 1151 Last data filed at 09/27/16 0925  Gross per 24 hour  Intake             2045 ml  Output             1200 ml  Net              845 ml   Filed Weights   09/26/16 1614 09/27/16 0343  Weight: 104.3 kg (230 lb) 111 kg (244 lb 11.4 oz)    Examination: General: No acute respiratory distress at rest  Lungs: Mild bibasilar crackles with expiratory wheezing Cardiovascular: Regular rate and rhythm without murmur  Abdomen: Nontender, nondistended, soft, bowel sounds positive, no rebound, no ascites, no appreciable mass Extremities: No significant cyanosis, clubbing, or edema bilateral lower extremities  CBC:  Recent Labs Lab 09/26/16 1003 09/27/16 0413  WBC 10.8* 16.2*  NEUTROABS 6.9  --   HGB 13.1 11.2*  HCT 39.9 34.9*  MCV 89.5 90.2  PLT 303 941   Basic Metabolic Panel:  Recent Labs Lab 09/26/16 1003 09/27/16 0413  NA 139 137  K 3.4* 3.7  CL 105 111  CO2 24 16*  GLUCOSE 140* 221*  BUN 13 14  CREATININE 0.86 0.71  CALCIUM 9.1 8.7*   GFR: Estimated Creatinine Clearance: 75.9 mL/min (by C-G formula based on SCr of 0.71 mg/dL).  Liver Function Tests:  Recent Labs Lab 09/26/16 1003  AST 22  ALT 18  ALKPHOS 86  BILITOT 0.8  PROT 6.9  ALBUMIN 4.0    Cardiac Enzymes:  Recent Labs Lab 09/26/16 1836  TROPONINI <0.03    HbA1C: Hgb A1c MFr Bld  Date/Time Value Ref Range Status  05/02/2014 10:05 PM 6.1 (H) <5.7 % Final    Comment:    (NOTE)                                                                       According to the ADA Clinical Practice Recommendations for 2011, when HbA1c is used as a screening test:  >=6.5%   Diagnostic of Diabetes Mellitus           (if abnormal result is confirmed) 5.7-6.4%   Increased risk of developing Diabetes Mellitus References:Diagnosis and Classification of Diabetes Mellitus,Diabetes PPIR,5188,41(YSAYT 1):S62-S69 and Standards of Medical  Care in         Diabetes - 2011,Diabetes KZSW,1093,23 (Suppl 1):S11-S61.    CBG:  Recent Labs Lab 09/26/16 1611 09/26/16 2334 09/27/16 0848  GLUCAP 259* 245* 205*    Recent Results (from the past 240 hour(s))  Culture, sputum-assessment     Status: None   Collection Time: 09/26/16  3:25 PM  Result Value Ref Range Status   Specimen Description EXPECTORATED SPUTUM  Final   Special Requests SPUEVA  Final   Sputum evaluation THIS SPECIMEN IS ACCEPTABLE FOR SPUTUM CULTURE  Final   Report Status 09/26/2016 FINAL  Final  Culture, respiratory (NON-Expectorated)     Status: None (Preliminary result)   Collection Time: 09/26/16  3:25 PM  Result Value Ref Range Status   Specimen Description EXPECTORATED SPUTUM  Final   Special Requests SPUEVA Reflexed from [id number]  Final   Gram Stain   Final    MODERATE WBC PRESENT, PREDOMINANTLY PMN RARE SQUAMOUS EPITHELIAL CELLS PRESENT RARE GRAM POSITIVE COCCI IN PAIRS    Culture TOO YOUNG TO READ  Final   Report Status PENDING  Incomplete  MRSA PCR Screening     Status: None   Collection Time: 09/26/16  3:58 PM  Result Value Ref Range Status   MRSA by PCR NEGATIVE NEGATIVE Final    Comment:        The GeneXpert MRSA Assay (FDA approved for NASAL specimens only), is one component of a comprehensive MRSA colonization surveillance program. It is not intended to diagnose MRSA infection nor to guide or monitor treatment for MRSA infections.   Respiratory Panel by PCR     Status: None   Collection Time: 09/26/16  4:10 PM  Result Value Ref Range Status   Adenovirus NOT DETECTED NOT DETECTED Final   Coronavirus 229E NOT DETECTED NOT DETECTED Final   Coronavirus HKU1 NOT DETECTED NOT DETECTED Final   Coronavirus NL63 NOT DETECTED NOT DETECTED Final   Coronavirus OC43 NOT DETECTED NOT DETECTED Final   Metapneumovirus NOT DETECTED NOT DETECTED Final   Rhinovirus / Enterovirus NOT DETECTED NOT DETECTED Final   Influenza A NOT DETECTED NOT  DETECTED Final   Influenza B NOT DETECTED NOT DETECTED Final   Parainfluenza Virus 1 NOT DETECTED NOT DETECTED Final   Parainfluenza Virus 2 NOT DETECTED NOT DETECTED Final   Parainfluenza Virus 3 NOT DETECTED NOT DETECTED Final  Parainfluenza Virus 4 NOT DETECTED NOT DETECTED Final   Respiratory Syncytial Virus NOT DETECTED NOT DETECTED Final   Bordetella pertussis NOT DETECTED NOT DETECTED Final   Chlamydophila pneumoniae NOT DETECTED NOT DETECTED Final   Mycoplasma pneumoniae NOT DETECTED NOT DETECTED Final     Scheduled Meds: . apixaban  5 mg Oral BID  . azithromycin  500 mg Oral Q24H  . cefTRIAXone (ROCEPHIN)  IV  1 g Intravenous Q24H  . diltiazem  120 mg Oral Daily  . ipratropium-albuterol  3 mL Nebulization TID  . methylPREDNISolone (SOLU-MEDROL) injection  60 mg Intravenous Q6H  . metoprolol  5 mg Intravenous Q6H  . sodium chloride flush  3 mL Intravenous Q12H     LOS: 0 days   Cherene Altes, MD [facility] Office  [phone] Pager - Text Page per Shea Evans as per below:  On-Call/Text Page:      [email or web address]      password [email or web address]  If 7PM-7AM, please contact night-coverage [email or web address] Password [facility] 09/27/2016, 11:51 AM

## 2016-09-28 ENCOUNTER — Inpatient Hospital Stay (HOSPITAL_COMMUNITY): Payer: PPO

## 2016-09-28 LAB — CBC
HCT: 33.2 % — ABNORMAL LOW (ref 36.0–46.0)
Hemoglobin: 10.8 g/dL — ABNORMAL LOW (ref 12.0–15.0)
MCH: 29.3 pg (ref 26.0–34.0)
MCHC: 32.5 g/dL (ref 30.0–36.0)
MCV: 90 fL (ref 78.0–100.0)
PLATELETS: 248 10*3/uL (ref 150–400)
RBC: 3.69 MIL/uL — AB (ref 3.87–5.11)
RDW: 14.1 % (ref 11.5–15.5)
WBC: 23 10*3/uL — AB (ref 4.0–10.5)

## 2016-09-28 LAB — COMPREHENSIVE METABOLIC PANEL
ALT: 17 U/L (ref 14–54)
AST: 19 U/L (ref 15–41)
Albumin: 3.5 g/dL (ref 3.5–5.0)
Alkaline Phosphatase: 69 U/L (ref 38–126)
Anion gap: 9 (ref 5–15)
BILIRUBIN TOTAL: 0.4 mg/dL (ref 0.3–1.2)
BUN: 18 mg/dL (ref 6–20)
CHLORIDE: 108 mmol/L (ref 101–111)
CO2: 22 mmol/L (ref 22–32)
CREATININE: 0.75 mg/dL (ref 0.44–1.00)
Calcium: 9.1 mg/dL (ref 8.9–10.3)
Glucose, Bld: 254 mg/dL — ABNORMAL HIGH (ref 65–99)
Potassium: 4 mmol/L (ref 3.5–5.1)
Sodium: 139 mmol/L (ref 135–145)
TOTAL PROTEIN: 6 g/dL — AB (ref 6.5–8.1)

## 2016-09-28 LAB — GLUCOSE, CAPILLARY
GLUCOSE-CAPILLARY: 183 mg/dL — AB (ref 65–99)
Glucose-Capillary: 253 mg/dL — ABNORMAL HIGH (ref 65–99)
Glucose-Capillary: 211 mg/dL — ABNORMAL HIGH (ref 65–99)

## 2016-09-28 LAB — LACTIC ACID, PLASMA
LACTIC ACID, VENOUS: 2.8 mmol/L — AB (ref 0.5–1.9)
LACTIC ACID, VENOUS: 3.7 mmol/L — AB (ref 0.5–1.9)
Lactic Acid, Venous: 3.1 mmol/L (ref 0.5–1.9)

## 2016-09-28 MED ORDER — HYDROCODONE-ACETAMINOPHEN 5-325 MG PO TABS
1.0000 | ORAL_TABLET | ORAL | Status: DC | PRN
  Administered 2016-09-28 (×2): 1 via ORAL
  Administered 2016-09-29: 2 via ORAL
  Administered 2016-09-29: 1 via ORAL
  Filled 2016-09-28: qty 2
  Filled 2016-09-28 (×3): qty 1

## 2016-09-28 MED ORDER — SODIUM CHLORIDE 0.9 % IV BOLUS (SEPSIS)
250.0000 mL | Freq: Once | INTRAVENOUS | Status: AC
  Administered 2016-09-28: 250 mL via INTRAVENOUS

## 2016-09-28 MED ORDER — DILTIAZEM HCL ER COATED BEADS 180 MG PO CP24
180.0000 mg | ORAL_CAPSULE | Freq: Every day | ORAL | Status: DC
  Administered 2016-09-29: 180 mg via ORAL
  Filled 2016-09-28: qty 1

## 2016-09-28 MED ORDER — INSULIN ASPART 100 UNIT/ML ~~LOC~~ SOLN
0.0000 [IU] | Freq: Every day | SUBCUTANEOUS | Status: DC
  Administered 2016-09-28: 2 [IU] via SUBCUTANEOUS

## 2016-09-28 MED ORDER — TRAMADOL HCL 50 MG PO TABS: 50.0000 mg | ORAL_TABLET | Freq: Four times a day (QID) | ORAL | Status: DC | PRN

## 2016-09-28 MED ORDER — IPRATROPIUM-ALBUTEROL 0.5-2.5 (3) MG/3ML IN SOLN
3.0000 mL | Freq: Two times a day (BID) | RESPIRATORY_TRACT | Status: DC
  Administered 2016-09-28 – 2016-09-29 (×2): 3 mL via RESPIRATORY_TRACT
  Filled 2016-09-28 (×2): qty 3

## 2016-09-28 MED ORDER — INSULIN ASPART 100 UNIT/ML ~~LOC~~ SOLN
0.0000 [IU] | Freq: Three times a day (TID) | SUBCUTANEOUS | Status: DC
  Administered 2016-09-28: 2 [IU] via SUBCUTANEOUS
  Administered 2016-09-28: 3 [IU] via SUBCUTANEOUS
  Administered 2016-09-29: 2 [IU] via SUBCUTANEOUS

## 2016-09-28 MED ORDER — SODIUM CHLORIDE 0.9 % IV BOLUS (SEPSIS)
500.0000 mL | Freq: Once | INTRAVENOUS | Status: AC
  Administered 2016-09-28: 500 mL via INTRAVENOUS

## 2016-09-28 MED ORDER — SODIUM CHLORIDE 0.9 % IV SOLN
INTRAVENOUS | Status: DC
  Administered 2016-09-28 – 2016-09-29 (×3): via INTRAVENOUS

## 2016-09-28 NOTE — Progress Notes (Signed)
CRITICAL VALUE ALERT  Critical value received:  Lactic Acid 3.1  Date of notification:  09/28/16  Time of notification:  8:30  Critical value read back:Yes.    Nurse who received alert:  Sandria Senter  MD notified (1st page):  Thereasa Solo, J  Time of first page:  [phone]  MD notified (2nd page):  Time of second page:  Responding MD:  NA  Time MD responded:  NA

## 2016-09-28 NOTE — Progress Notes (Addendum)
TEAM 1 - Stepdown/ICU TEAM  [name]  EGB:[id number] DOB: [birth year] DOA: 09/26/2016 PCP: Jani Gravel, MD    Brief Narrative:  71 y.o. female with a hx of A. Fib on chronic anticoagulation, HTN, obesity, and diastolic heart failure who presented with shortness breath felt to be due to acute bacterial bronchitis that failed outpatient treatment with Levaquin.  Subjective: The patient admits that she continues to feel quite anxious but overall is improving.  She feels less short of breath in general.  She does have coughing paroxysmal lungs that are productive of very thick dark green sputum and that exacerbate her anxiety.  She denies chest pain nausea vomiting or abdominal pain.  Assessment & Plan:  Acute hypoxic respiratory failure - acute bacterial bronchitis  Respiratory virus panel negative - no focal infiltrate noted on initial CXR at admit or after hydration - d/c abx after 7 full days of tx - wean steroids asap - mobilize pt - follow O2 sats  Lactic acidosis Lactate improved, then began upward trend again - return to hydration efforts and follow   Severe anxiety Appears to be better controlled but is certainly contributing to respiratory symptoms - ? component of vocal cord dysfunction - I have counseled the patient that Xanax is only for very short-term use in the hospital and that we will not plan to discharge her home on this medication - she agrees that she is not normally anxious but that respiratory difficulties seem to bring on her anxiety severely  Chronic Afib CHA2DS2-VASc is 4 - cont eliquis and cardizem - rate controlled   Chronic diastolic CHF Grade 2 per TTE Oct 2015 w/ normal EF on cardiac cath July 2016 - baseline weight approximately 110 kg - follow Is/Os and weights - no signif overload on exam today - care w/ volume expansion ongoing  Filed Weights   09/26/16 1614 09/27/16 0343 09/28/16 0421  Weight: 104.3 kg (230 lb) 111 kg (244 lb 11.4 oz) 111 kg  (244 lb 11.4 oz)    HTN BP trending up - adjust medical tx again today and follow  Obesity - Body mass index is 44.76 kg/m.   Hyperglycemia  No known hx of MD - steroid clearly playing a role - wean steroid asap - check A1c   DVT prophylaxis: eliquis Code Status: FULL CODE Family Communication: Spoke with husband at bedside Disposition Plan: Anticipate transfer to telemetry bed 3/19  Consultants:  none  Procedures: none  Antimicrobials:  Ceftriaxone 3/17 > Azithromycin 3/17 >  Objective: Blood pressure (!) 150/62, pulse 79, temperature 97.8 F (36.6 C), temperature source Oral, resp. rate (!) 29, height 5\' 2"  (1.575 m), weight 111 kg (244 lb 11.4 oz), SpO2 96 %.  Intake/Output Summary (Last 24 hours) at 09/28/16 1027 Last data filed at 09/27/16 1600  Gross per 24 hour  Intake               50 ml  Output              250 ml  Net             -200 ml   Filed Weights   09/26/16 1614 09/27/16 0343 09/28/16 0421  Weight: 104.3 kg (230 lb) 111 kg (244 lb 11.4 oz) 111 kg (244 lb 11.4 oz)    Examination: General: No acute respiratory distress at rest in bed Lungs: Clear to auscultation throughout with no wheezing or focal crackles Cardiovascular: Regular rate and rhythm without  murmur  Abdomen: Nontender, nondistended, soft, bowel sounds positive, no rebound, no appreciable mass Extremities: No significant edema bilateral lower extremities  CBC:  Recent Labs Lab 09/26/16 1003 09/27/16 0413 09/28/16 0315  WBC 10.8* 16.2* 23.0*  NEUTROABS 6.9  --   --   HGB 13.1 11.2* 10.8*  HCT 39.9 34.9* 33.2*  MCV 89.5 90.2 90.0  PLT 303 247 867   Basic Metabolic Panel:  Recent Labs Lab 09/26/16 1003 09/27/16 0413 09/28/16 0315  NA 139 137 139  K 3.4* 3.7 4.0  CL 105 111 108  CO2 24 16* 22  GLUCOSE 140* 221* 254*  BUN 13 14 18   CREATININE 0.86 0.71 0.75  CALCIUM 9.1 8.7* 9.1   GFR: Estimated Creatinine Clearance: 75.9 mL/min (by C-G formula based on SCr of  0.75 mg/dL).  Liver Function Tests:  Recent Labs Lab 09/26/16 1003 09/28/16 0315  AST 22 19  ALT 18 17  ALKPHOS 86 69  BILITOT 0.8 0.4  PROT 6.9 6.0*  ALBUMIN 4.0 3.5    Cardiac Enzymes:  Recent Labs Lab 09/26/16 1836  TROPONINI <0.03    HbA1C: Hgb A1c MFr Bld  Date/Time Value Ref Range Status  05/02/2014 10:05 PM 6.1 (H) <5.7 % Final    Comment:    (NOTE)                                                                       According to the ADA Clinical Practice Recommendations for 2011, when HbA1c is used as a screening test:  >=6.5%   Diagnostic of Diabetes Mellitus           (if abnormal result is confirmed) 5.7-6.4%   Increased risk of developing Diabetes Mellitus References:Diagnosis and Classification of Diabetes Mellitus,Diabetes [facility],9470,96(GEZMO 1):S62-S69 and Standards of Medical Care in         Diabetes - 2011,Diabetes QHUT,6546,50 (Suppl 1):S11-S61.    CBG:  Recent Labs Lab 09/26/16 1611 09/26/16 2334 09/27/16 0848 09/27/16 1307 09/27/16 1643  GLUCAP 259* 245* 205* 260* 233*    Recent Results (from the past 240 hour(s))  Blood culture (routine x 2)     Status: None (Preliminary result)   Collection Time: 09/26/16 10:03 AM  Result Value Ref Range Status   Specimen Description RIGHT ANTECUBITAL  Final   Special Requests AEROBIC BOTTLE ONLY 10CC  Final   Culture NO GROWTH 1 DAY  Final   Report Status PENDING  Incomplete  Blood culture (routine x 2)     Status: None (Preliminary result)   Collection Time: 09/26/16 10:40 AM  Result Value Ref Range Status   Specimen Description BLOOD LEFT HAND  Final   Special Requests AEROBIC BOTTLE ONLY 10CC  Final   Culture NO GROWTH 1 DAY  Final   Report Status PENDING  Incomplete  Culture, sputum-assessment     Status: None   Collection Time: 09/26/16  3:25 PM  Result Value Ref Range Status   Specimen Description EXPECTORATED SPUTUM  Final   Special Requests SPUEVA  Final   Sputum evaluation THIS  SPECIMEN IS ACCEPTABLE FOR SPUTUM CULTURE  Final   Report Status 09/26/2016 FINAL  Final  Culture, respiratory (NON-Expectorated)     Status: None (Preliminary result)  Collection Time: 09/26/16  3:25 PM  Result Value Ref Range Status   Specimen Description EXPECTORATED SPUTUM  Final   Special Requests SPUEVA Reflexed from [id number]  Final   Gram Stain   Final    MODERATE WBC PRESENT, PREDOMINANTLY PMN RARE SQUAMOUS EPITHELIAL CELLS PRESENT RARE GRAM POSITIVE COCCI IN PAIRS    Culture TOO YOUNG TO READ  Final   Report Status PENDING  Incomplete  MRSA PCR Screening     Status: None   Collection Time: 09/26/16  3:58 PM  Result Value Ref Range Status   MRSA by PCR NEGATIVE NEGATIVE Final    Comment:        The GeneXpert MRSA Assay (FDA approved for NASAL specimens only), is one component of a comprehensive MRSA colonization surveillance program. It is not intended to diagnose MRSA infection nor to guide or monitor treatment for MRSA infections.   Respiratory Panel by PCR     Status: None   Collection Time: 09/26/16  4:10 PM  Result Value Ref Range Status   Adenovirus NOT DETECTED NOT DETECTED Final   Coronavirus 229E NOT DETECTED NOT DETECTED Final   Coronavirus HKU1 NOT DETECTED NOT DETECTED Final   Coronavirus NL63 NOT DETECTED NOT DETECTED Final   Coronavirus OC43 NOT DETECTED NOT DETECTED Final   Metapneumovirus NOT DETECTED NOT DETECTED Final   Rhinovirus / Enterovirus NOT DETECTED NOT DETECTED Final   Influenza A NOT DETECTED NOT DETECTED Final   Influenza B NOT DETECTED NOT DETECTED Final   Parainfluenza Virus 1 NOT DETECTED NOT DETECTED Final   Parainfluenza Virus 2 NOT DETECTED NOT DETECTED Final   Parainfluenza Virus 3 NOT DETECTED NOT DETECTED Final   Parainfluenza Virus 4 NOT DETECTED NOT DETECTED Final   Respiratory Syncytial Virus NOT DETECTED NOT DETECTED Final   Bordetella pertussis NOT DETECTED NOT DETECTED Final   Chlamydophila pneumoniae NOT DETECTED NOT  DETECTED Final   Mycoplasma pneumoniae NOT DETECTED NOT DETECTED Final     Scheduled Meds: . apixaban  5 mg Oral BID  . azithromycin  500 mg Oral Q24H  . cefTRIAXone (ROCEPHIN)  IV  1 g Intravenous Q24H  . diltiazem  120 mg Oral Daily  . ipratropium-albuterol  3 mL Nebulization BID  . methylPREDNISolone (SOLU-MEDROL) injection  60 mg Intravenous Q12H  . sodium chloride  250 mL Intravenous Once  . sodium chloride flush  3 mL Intravenous Q12H     LOS: 1 day   Cherene Altes, MD [facility] Office  [phone] Pager - Text Page per Shea Evans as per below:  On-Call/Text Page:      [email or web address]      password [email or web address]  If 7PM-7AM, please contact night-coverage [email or web address] Password [email or web address] 09/28/2016, 10:27 AM

## 2016-09-28 NOTE — Progress Notes (Signed)
Inpatient Diabetes Program Recommendations  AACE/ADA: New Consensus Statement on Inpatient Glycemic Control (2015)  Target Ranges:  Prepandial:   less than 140 mg/dL      Peak postprandial:   less than 180 mg/dL (1-2 hours)      Critically ill patients:  140 - 180 mg/dL   Results for [name], [name] (MRN [id number]) as of 09/28/2016 10:03  Ref. Range 09/26/2016 10:03 09/27/2016 04:13 09/28/2016 03:15  Glucose Latest Ref Range: 65 - 99 mg/dL 140 (H) 221 (H) 254 (H)  Results for [name], [name] (MRN [id number]) as of 09/28/2016 10:03  Ref. Range 05/02/2014 22:05  Hemoglobin A1C Latest Ref Range: <5.7 % 6.1 (H)   Review of Glycemic Control  Diabetes history: No Outpatient Diabetes medications:NA Current orders for Inpatient glycemic control: None  Inpatient Diabetes Program Recommendations: Correction (SSI): While inpatient and ordered steroids, please consider ordering CBGs with Novolog correction scale ACHS. Insulin - Meal Coverage: If steroids are continued as ordered (Solumedrol 60 mg Q12H), please consider ordering Novolog 3 units TID with meals for meal coverage if patient eats at least 50% of meals. HgbA1C: Please order an A1C to evaluate glycemic control over the past 2-3 months.  Thanks, Barnie Alderman, RN, MSN, CDE Diabetes Coordinator Inpatient Diabetes Program [phone] (Team Pager from 8am to 5pm)

## 2016-09-29 LAB — CBC
HCT: 33.3 % — ABNORMAL LOW (ref 36.0–46.0)
Hemoglobin: 10.7 g/dL — ABNORMAL LOW (ref 12.0–15.0)
MCH: 28.9 pg (ref 26.0–34.0)
MCHC: 32.1 g/dL (ref 30.0–36.0)
MCV: 90 fL (ref 78.0–100.0)
PLATELETS: 263 10*3/uL (ref 150–400)
RBC: 3.7 MIL/uL — ABNORMAL LOW (ref 3.87–5.11)
RDW: 13.8 % (ref 11.5–15.5)
WBC: 17.5 10*3/uL — ABNORMAL HIGH (ref 4.0–10.5)

## 2016-09-29 LAB — CULTURE, RESPIRATORY W GRAM STAIN: Culture: NORMAL

## 2016-09-29 LAB — BASIC METABOLIC PANEL
ANION GAP: 12 (ref 5–15)
BUN: 24 mg/dL — AB (ref 6–20)
CALCIUM: 8.8 mg/dL — AB (ref 8.9–10.3)
CHLORIDE: 106 mmol/L (ref 101–111)
CO2: 21 mmol/L — ABNORMAL LOW (ref 22–32)
Creatinine, Ser: 0.8 mg/dL (ref 0.44–1.00)
GFR calc Af Amer: >60 mL/min (ref >60)
Glucose, Bld: 206 mg/dL — ABNORMAL HIGH (ref 65–99)
Potassium: 4.2 mmol/L (ref 3.5–5.1)
SODIUM: 139 mmol/L (ref 135–145)

## 2016-09-29 LAB — GLUCOSE, CAPILLARY
GLUCOSE-CAPILLARY: 160 mg/dL — AB (ref 65–99)
Glucose-Capillary: 194 mg/dL — ABNORMAL HIGH (ref 65–99)

## 2016-09-29 LAB — CULTURE, RESPIRATORY

## 2016-09-29 LAB — LACTIC ACID, PLASMA: LACTIC ACID, VENOUS: 2.2 mmol/L — AB (ref 0.5–1.9)

## 2016-09-29 LAB — HEMOGLOBIN A1C
Hgb A1c MFr Bld: 5.8 % — ABNORMAL HIGH (ref 4.8–5.6)
MEAN PLASMA GLUCOSE: 120 mg/dL

## 2016-09-29 MED ORDER — AZITHROMYCIN 500 MG PO TABS: 500.0000 mg | ORAL_TABLET | ORAL | 0 refills | Status: DC

## 2016-09-29 MED ORDER — ALBUTEROL SULFATE (2.5 MG/3ML) 0.083% IN NEBU: 2.5000 mg | INHALATION_SOLUTION | Freq: Four times a day (QID) | RESPIRATORY_TRACT | Status: DC | PRN

## 2016-09-29 MED ORDER — AMOXICILLIN-POT CLAVULANATE 875-125 MG PO TABS: 1.0000 | ORAL_TABLET | Freq: Two times a day (BID) | ORAL | 0 refills | Status: AC

## 2016-09-29 MED ORDER — PREDNISONE 10 MG PO TABS: ORAL_TABLET | ORAL | 0 refills | Status: DC

## 2016-09-29 NOTE — Discharge Instructions (Signed)

## 2016-09-29 NOTE — Care Management Note (Signed)
Case Management Note Marvetta Gibbons RN, BSN Unit 2W-Case Manager [phone]  Patient Details  Name: [name] MRN: [id number] Date of Birth: July 25, 1945  Subjective/Objective:  Pt admitted with acute resp. distress                   Action/Plan: PTA pt lived at home- independent- plan to return home- to f/u with PCP - no CM needs noted for discharge at this time    Expected Discharge Date:  09/29/16               Expected Discharge Plan:  [place]  In-House Referral:     Discharge planning Services  CM Consult  Post Acute Care Choice:  Home Health Choice offered to:  Patient  DME Arranged:    DME Agency:     HH Arranged:  PT [place]:  [place]  Status of Service:  Completed, signed off  If discussed at [place] of Stay Meetings, dates discussed:    Discharge Disposition: home with home health   Additional Comments:  09/29/16- 1114- Marvetta Gibbons RN, CM- per PT eval prior to discharge was informed that recommendation for HHPT was being made per PT- notified MD for HHPT order- spoke with pt at bedside who is agreeable to [facility] services "short term"- list of [place] agencies provided for choice- per pt she would like to use [facility] for HHPT- pt states that she has all needed DME- referral called to Santiago Glad with [facility] for [place], Romeo Rabon, RN 09/29/2016, 11:14 AM

## 2016-09-29 NOTE — Care Management Note (Signed)
Case Management Note Marvetta Gibbons RN, BSN Unit 2W-Case Manager [phone]  Patient Details  Name: [name] MRN: [id number] Date of Birth: March 11, 1946  Subjective/Objective:  Pt admitted with acute resp. distress                   Action/Plan: PTA pt lived at home- independent- plan to return home- to f/u with PCP - no CM needs noted for discharge.    Expected Discharge Date:  09/29/16               Expected Discharge Plan:  Home/Self Care  In-House Referral:     Discharge planning Services  CM Consult  Post Acute Care Choice:  NA Choice offered to:  NA  DME Arranged:    DME Agency:     HH Arranged:    HH Agency:     Status of Service:  Completed, signed off  If discussed at [employer] of Stay Meetings, dates discussed:    Additional Comments:  Dawayne Patricia, RN 09/29/2016, 10:35 AM

## 2016-09-29 NOTE — Progress Notes (Signed)
09/29/2016 1400 Discharge AVS meds taken today and those due this evening reviewed.  Follow-up appointments and when to call md reviewed.  D/C IV and TELE.  Questions and concerns addressed.   D/C home per orders. [place]

## 2016-09-29 NOTE — Discharge Summary (Addendum)
Physician Discharge Summary  [name] WUX:[id number] DOB: [birth year] DOA: 09/26/2016  PCP: Jani Gravel, MD  Admit date: 09/26/2016 Discharge date: 09/29/2016  Admitted From: Home Disposition:  Home  Recommendations for Outpatient Follow-up:  1. Follow up with PCP in 1 week 2. Please obtain BMP/CBC in 1 week  3. Please follow up on the following pending results: Final blood culture results, respiratory culture result 4. Consider outpatient PFT   Home Health: [name] Equipment/Devices: None   Discharge Condition: Stable CODE STATUS: Full code  Diet recommendation: The  Brief/Interim Summary: From H&P: [name] is a pleasant 71 y.o. female with medical history significant A. fib on chronic anticoagulation, chronic diastolic heart failure, hypertension, obesity, since emergency Department chief complaint worsening shortness of breath. Initial evaluation reveals acute respiratory distress with a chest x-ray concerning for pneumonia, elevated lactic acid mild leukocytosis  She reports 10 days ago she was diagnosed by primary care provider with influenza. She was given antibiotics but was not given Tamiflu. She states in the several days after that she began to feel better. She reports that during that visit he prescribed albuterol inhaler as well. She states over the last 2 days her cough and gradual worsening shortness of breath came back. Around 1:30 she reports developing severe shortness of breath with wheezing. She used her new inhalers with little relief. She denies chest pain palpitations lower extremity edema headache dizziness syncope or near-syncope. She denies any diaphoresis nausea vomiting abdominal pain dysuria hematuria frequency or urgency. She denies diarrhea constipation melena bright red blood per rectum. She also reports no history of asthma COPD or smoking. She has never had pulmonary function test.. She does report 2 years ago she had similar symptoms and was diagnosed  with A. fib with RVR and heart failure  ED Course: In the emergency department she is afebrile hemodynamically stable with tachypnea oxygen saturation levels 91% on 3 L nasal cannula. He is provided with 3 L normal saline nebulizer 225 mg of Solu-Medrol, azithromycin and Rocephin.   Interim: Respiratory viral panel was negative. There was no focal infiltrate noted on initial chest x-ray. She was treated for acute bronchitis with slow improvement in symptoms. Her anxiety due to dyspnea has been a complicating factor during her hospitalization.  Subjective on day of discharge: Feeling mildly better today. She states that when she gets short of breath, she becomes very anxious, hyperventilates, which worsens her breathing. We discussed importance of finishing her antibiotic course to completion as well as taking in deep breaths when she feels short of breath so that she does not exacerbate her dyspnea with hyperventilation.   Discharge Diagnoses:  Principal Problem:   Acute respiratory distress Active Problems:   Chronic diastolic heart failure (HCC)   Obesity   Essential hypertension   Paroxysmal atrial fibrillation (HCC)   Chronic anticoagulation   CAP (community acquired pneumonia)   Elevated lactic acid level   Anxiety  Acute hypoxic respiratory failure  -Secondary to acute bacterial bronchitis -Respiratory viral panel is negative -Continue antibiotics to completion, steroid taper   Leukocytosis -Due to above, steroid use -Monitor CBC as outpatient   Severe anxiety -Has been controlled with Xanax in the hospital. We will not discharge home on this medication, but she may follow up with her primary care physician. This may be contributing to her respiratory symptoms as well.  Chronic atrial fibrillation  -CHA2DS2-VASc is 4  -Continue eliquis, cardizem   Chronic diastolic CHF  -Grade 2 per TTE  Oct 2015 w/ normal EF on cardiac cath July 2016  -Not in exacerbation    Obesity -Body mass index is 44.76 kg/m  Discharge Instructions  Discharge Instructions    Diet - low sodium heart healthy    Complete by:  As directed    Increase activity slowly    Complete by:  As directed      Allergies as of 09/29/2016   No Known Allergies     Medication List    TAKE these medications   acetaminophen 325 MG tablet Commonly known as:  TYLENOL Take 650 mg by mouth every 6 (six) hours as needed for mild pain.   albuterol 108 (90 Base) MCG/ACT inhaler Commonly known as:  PROVENTIL HFA;VENTOLIN HFA Inhale 1 puff into the lungs every 6 (six) hours as needed for wheezing or shortness of breath.   amoxicillin-clavulanate 875-125 MG tablet Commonly known as:  AUGMENTIN Take 1 tablet by mouth 2 (two) times daily.   apixaban 5 MG Tabs tablet Commonly known as:  ELIQUIS Take 1 tablet (5 mg total) by mouth 2 (two) times daily.   azithromycin 500 MG tablet Commonly known as:  ZITHROMAX Take 1 tablet (500 mg total) by mouth daily.   diltiazem 120 MG 24 hr capsule Commonly known as:  CARTIA XT Take 1 capsule (120 mg total) by mouth daily.   predniSONE 10 MG tablet Commonly known as:  DELTASONE Take 4 tabs for 3 days, then 3 tabs for 3 days, then 2 tabs for 3 days, then 1 tab for 3 days, then 1/2 tab for 4 days.   TUSSIONEX PENNKINETIC ER 10-8 MG/5ML Suer Generic drug:  chlorpheniramine-HYDROcodone Take 5 mLs by mouth every 12 (twelve) hours as needed for cough.      Follow-up Information    Jani Gravel, MD. Schedule an appointment as soon as possible for a visit in 1 week(s).   Specialty:  Internal Medicine Contact information: [place] [id number] [phone]          No Known Allergies  Consultations:  None   Procedures/Studies: Dg Chest 2 View  Result Date: 09/28/2016 CLINICAL DATA:  Recent pneumonia EXAM: CHEST  2 VIEW COMPARISON:  September 26, 2016 chest radiograph and chest CT May 02, 2014  FINDINGS: There is no edema or consolidation. Heart is borderline enlarged with pulmonary vascularity within normal limits. No adenopathy. There is postoperative change in each shoulder and in the lower cervical spine. IMPRESSION: No edema or consolidation.  No evident adenopathy. Electronically Signed   By: Lowella Grip III M.D.   On: 09/28/2016 08:11   Dg Chest Port 1 View  Result Date: 09/26/2016 CLINICAL DATA:  Shortness of breath. EXAM: PORTABLE CHEST 1 VIEW COMPARISON:  09/26/2016 FINDINGS: Cardiomediastinal silhouette is normal. Mediastinal contours appear intact. Prominence of the left hilum. There is no evidence of focal airspace consolidation, pleural effusion or pneumothorax. Osseous structures are without acute abnormality. Cervical spine fusion seen. Soft tissues are grossly normal. IMPRESSION: Prominence of the left hilum which may represent overlapping vascular shadows or potentially central perihilar pulmonary mass. Repeated PA and lateral radiograph of the chest in full inspiration may be considered. Electronically Signed   By: Fidela Salisbury M.D.   On: 09/26/2016 20:11   Dg Chest Portable 1 View  Result Date: 09/26/2016 CLINICAL DATA:  Shortness of breath and cough over the last 10 days. EXAM: PORTABLE CHEST 1 VIEW COMPARISON:  05/02/2014 FINDINGS: Heart size is normal. Mediastinal shadows are  normal. There is central bronchial thickening but no infiltrate, collapse or effusion. No acute bone finding. Postoperative changes of the cervical spine and both shoulders. IMPRESSION: Possible bronchitis.  No infiltrate, collapse or effusion. Electronically Signed   By: Nelson Chimes M.D.   On: 09/26/2016 10:26        Discharge Exam: Vitals:   09/28/16 2056 09/29/16 0603  BP:  (!) 143/56  Pulse: 72 60  Resp: 18 20  Temp:  97.4 F (36.3 C)   Vitals:   09/28/16 2056 09/29/16 0300 09/29/16 0603 09/29/16 0921  BP:   (!) 143/56   Pulse: 72  60   Resp: 18  20   Temp:   97.4 F  (36.3 C)   TempSrc:   Oral   SpO2:   98% 98%  Weight:  113.7 kg (250 lb 11.2 oz)    Height:        General: [name] is alert, awake, not in acute distress Cardiovascular: RRR, S1/S2 +, no rubs, no gallops Respiratory: CTA bilaterally, no wheezing, no rhonchi Abdominal: Soft, NT, ND, bowel sounds + Extremities: no edema, no cyanosis    The results of significant diagnostics from this hospitalization (including imaging, microbiology, ancillary and laboratory) are listed below for reference.     Microbiology: Recent Results (from the past 240 hour(s))  Blood culture (routine x 2)     Status: None (Preliminary result)   Collection Time: 09/26/16 10:03 AM  Result Value Ref Range Status   Specimen Description RIGHT ANTECUBITAL  Final   Special Requests AEROBIC BOTTLE ONLY 10CC  Final   Culture NO GROWTH 2 DAYS  Final   Report Status PENDING  Incomplete  Blood culture (routine x 2)     Status: None (Preliminary result)   Collection Time: 09/26/16 10:40 AM  Result Value Ref Range Status   Specimen Description BLOOD LEFT HAND  Final   Special Requests AEROBIC BOTTLE ONLY 10CC  Final   Culture NO GROWTH 2 DAYS  Final   Report Status PENDING  Incomplete  Culture, sputum-assessment     Status: None   Collection Time: 09/26/16  3:25 PM  Result Value Ref Range Status   Specimen Description EXPECTORATED SPUTUM  Final   Special Requests SPUEVA  Final   Sputum evaluation THIS SPECIMEN IS ACCEPTABLE FOR SPUTUM CULTURE  Final   Report Status 09/26/2016 FINAL  Final  Culture, respiratory (NON-Expectorated)     Status: None (Preliminary result)   Collection Time: 09/26/16  3:25 PM  Result Value Ref Range Status   Specimen Description EXPECTORATED SPUTUM  Final   Special Requests SPUEVA Reflexed from [id number]  Final   Gram Stain   Final    MODERATE WBC PRESENT, PREDOMINANTLY PMN RARE SQUAMOUS EPITHELIAL CELLS PRESENT RARE GRAM POSITIVE COCCI IN PAIRS    Culture CULTURE REINCUBATED FOR BETTER  GROWTH  Final   Report Status PENDING  Incomplete  MRSA PCR Screening     Status: None   Collection Time: 09/26/16  3:58 PM  Result Value Ref Range Status   MRSA by PCR NEGATIVE NEGATIVE Final    Comment:        The GeneXpert MRSA Assay (FDA approved for NASAL specimens only), is one component of a comprehensive MRSA colonization surveillance program. It is not intended to diagnose MRSA infection nor to guide or monitor treatment for MRSA infections.   Respiratory Panel by PCR     Status: None   Collection Time: 09/26/16  4:10 PM  Result Value Ref Range Status   Adenovirus NOT DETECTED NOT DETECTED Final   Coronavirus 229E NOT DETECTED NOT DETECTED Final   Coronavirus HKU1 NOT DETECTED NOT DETECTED Final   Coronavirus NL63 NOT DETECTED NOT DETECTED Final   Coronavirus OC43 NOT DETECTED NOT DETECTED Final   Metapneumovirus NOT DETECTED NOT DETECTED Final   Rhinovirus / Enterovirus NOT DETECTED NOT DETECTED Final   Influenza A NOT DETECTED NOT DETECTED Final   Influenza B NOT DETECTED NOT DETECTED Final   Parainfluenza Virus 1 NOT DETECTED NOT DETECTED Final   Parainfluenza Virus 2 NOT DETECTED NOT DETECTED Final   Parainfluenza Virus 3 NOT DETECTED NOT DETECTED Final   Parainfluenza Virus 4 NOT DETECTED NOT DETECTED Final   Respiratory Syncytial Virus NOT DETECTED NOT DETECTED Final   Bordetella pertussis NOT DETECTED NOT DETECTED Final   Chlamydophila pneumoniae NOT DETECTED NOT DETECTED Final   Mycoplasma pneumoniae NOT DETECTED NOT DETECTED Final     Labs: BNP (last 3 results)  Recent Labs  09/26/16 1003  BNP 78.2   Basic Metabolic Panel:  Recent Labs Lab 09/26/16 1003 09/27/16 0413 09/28/16 0315 09/29/16 0316  NA 139 137 139 139  K 3.4* 3.7 4.0 4.2  CL 105 111 108 106  CO2 24 16* 22 21*  GLUCOSE 140* 221* 254* 206*  BUN 13 14 18  24*  CREATININE 0.86 0.71 0.75 0.80  CALCIUM 9.1 8.7* 9.1 8.8*   Liver Function Tests:  Recent Labs Lab 09/26/16 1003  09/28/16 0315  AST 22 19  ALT 18 17  ALKPHOS 86 69  BILITOT 0.8 0.4  PROT 6.9 6.0*  ALBUMIN 4.0 3.5   No results for input(s): LIPASE, AMYLASE in the last 168 hours. No results for input(s): AMMONIA in the last 168 hours. CBC:  Recent Labs Lab 09/26/16 1003 09/27/16 0413 09/28/16 0315 09/29/16 0316  WBC 10.8* 16.2* 23.0* 17.5*  NEUTROABS 6.9  --   --   --   HGB 13.1 11.2* 10.8* 10.7*  HCT 39.9 34.9* 33.2* 33.3*  MCV 89.5 90.2 90.0 90.0  PLT 303 247 248 263   Cardiac Enzymes:  Recent Labs Lab 09/26/16 1836  TROPONINI <0.03   BNP: Invalid input(s): POCBNP CBG:  Recent Labs Lab 09/27/16 1643 09/28/16 1131 09/28/16 1720 09/28/16 2135 09/29/16 0602  GLUCAP 233* 253* 183* 211* 194*   D-Dimer No results for input(s): DDIMER in the last 72 hours. Hgb A1c  Recent Labs  09/28/16 0315  HGBA1C 5.8*   Lipid Profile No results for input(s): CHOL, HDL, LDLCALC, TRIG, CHOLHDL, LDLDIRECT in the last 72 hours. Thyroid function studies No results for input(s): TSH, T4TOTAL, T3FREE, THYROIDAB in the last 72 hours.  Invalid input(s): FREET3 Anemia work up No results for input(s): VITAMINB12, FOLATE, FERRITIN, TIBC, IRON, RETICCTPCT in the last 72 hours. Urinalysis    Component Value Date/Time   COLORURINE YELLOW 07/29/2012 1128   APPEARANCEUR CLEAR 07/29/2012 1128   LABSPEC 1.022 07/29/2012 1128   PHURINE 5.5 07/29/2012 1128   GLUCOSEU NEGATIVE 07/29/2012 1128   HGBUR SMALL (A) 07/29/2012 1128   BILIRUBINUR NEGATIVE 07/29/2012 1128   KETONESUR NEGATIVE 07/29/2012 1128   PROTEINUR NEGATIVE 07/29/2012 1128   UROBILINOGEN 0.2 07/29/2012 1128   NITRITE NEGATIVE 07/29/2012 1128   LEUKOCYTESUR SMALL (A) 07/29/2012 1128   Sepsis Labs Invalid input(s): PROCALCITONIN,  WBC,  LACTICIDVEN Microbiology Recent Results (from the past 240 hour(s))  Blood culture (routine x 2)     Status: None (Preliminary result)   Collection  Time: 09/26/16 10:03 AM  Result Value Ref  Range Status   Specimen Description RIGHT ANTECUBITAL  Final   Special Requests AEROBIC BOTTLE ONLY 10CC  Final   Culture NO GROWTH 2 DAYS  Final   Report Status PENDING  Incomplete  Blood culture (routine x 2)     Status: None (Preliminary result)   Collection Time: 09/26/16 10:40 AM  Result Value Ref Range Status   Specimen Description BLOOD LEFT HAND  Final   Special Requests AEROBIC BOTTLE ONLY 10CC  Final   Culture NO GROWTH 2 DAYS  Final   Report Status PENDING  Incomplete  Culture, sputum-assessment     Status: None   Collection Time: 09/26/16  3:25 PM  Result Value Ref Range Status   Specimen Description EXPECTORATED SPUTUM  Final   Special Requests SPUEVA  Final   Sputum evaluation THIS SPECIMEN IS ACCEPTABLE FOR SPUTUM CULTURE  Final   Report Status 09/26/2016 FINAL  Final  Culture, respiratory (NON-Expectorated)     Status: None (Preliminary result)   Collection Time: 09/26/16  3:25 PM  Result Value Ref Range Status   Specimen Description EXPECTORATED SPUTUM  Final   Special Requests SPUEVA Reflexed from [id number]  Final   Gram Stain   Final    MODERATE WBC PRESENT, PREDOMINANTLY PMN RARE SQUAMOUS EPITHELIAL CELLS PRESENT RARE GRAM POSITIVE COCCI IN PAIRS    Culture CULTURE REINCUBATED FOR BETTER GROWTH  Final   Report Status PENDING  Incomplete  MRSA PCR Screening     Status: None   Collection Time: 09/26/16  3:58 PM  Result Value Ref Range Status   MRSA by PCR NEGATIVE NEGATIVE Final    Comment:        The GeneXpert MRSA Assay (FDA approved for NASAL specimens only), is one component of a comprehensive MRSA colonization surveillance program. It is not intended to diagnose MRSA infection nor to guide or monitor treatment for MRSA infections.   Respiratory Panel by PCR     Status: None   Collection Time: 09/26/16  4:10 PM  Result Value Ref Range Status   Adenovirus NOT DETECTED NOT DETECTED Final   Coronavirus 229E NOT DETECTED NOT DETECTED Final    Coronavirus HKU1 NOT DETECTED NOT DETECTED Final   Coronavirus NL63 NOT DETECTED NOT DETECTED Final   Coronavirus OC43 NOT DETECTED NOT DETECTED Final   Metapneumovirus NOT DETECTED NOT DETECTED Final   Rhinovirus / Enterovirus NOT DETECTED NOT DETECTED Final   Influenza A NOT DETECTED NOT DETECTED Final   Influenza B NOT DETECTED NOT DETECTED Final   Parainfluenza Virus 1 NOT DETECTED NOT DETECTED Final   Parainfluenza Virus 2 NOT DETECTED NOT DETECTED Final   Parainfluenza Virus 3 NOT DETECTED NOT DETECTED Final   Parainfluenza Virus 4 NOT DETECTED NOT DETECTED Final   Respiratory Syncytial Virus NOT DETECTED NOT DETECTED Final   Bordetella pertussis NOT DETECTED NOT DETECTED Final   Chlamydophila pneumoniae NOT DETECTED NOT DETECTED Final   Mycoplasma pneumoniae NOT DETECTED NOT DETECTED Final     Time coordinating discharge: 40 minutes  SIGNED:  Dessa Phi, DO [facility] Pager [phone]  If 7PM-7AM, please contact night-coverage [email or web address] Password [email or web address] 09/29/2016, 10:08 AM

## 2016-09-29 NOTE — Progress Notes (Signed)
CRITICAL VALUE ALERT  Critical value received:  Lactic acid 2.2  Date of notification:  09/29/2016  Time of notification:  03:55  Critical value read back: Yes  Nurse who received alert:  Jairo Ben, RN  MD notified (1st page):  Dr. Hilbert Bible  Time of first page:  04:08  MD notified (2nd page):  Time of second page:  Responding MD:  None  Time MD responded:  N/A

## 2016-09-29 NOTE — Evaluation (Signed)
Physical Therapy Evaluation Patient Details Name: [name] MRN: [id number] DOB: [birth year] Today's Date: 09/29/2016   History of Present Illness  71 yo admitted with respiratory distress due to bronchitis, pt with anxiety contributing to symptoms. PMHx: Afib, HTN, obesity, HF, bil TKA  Clinical Impression  Pt pleasant and very willing to walk since being limited in mobility since admission. Pt continues to have anxiety at times with cues for slow deep breathing with all activity as well as decreased gait speed at times. Pt with bil LE weakness with difficulty ascending stairs and fatigue after hall ambulation who will benefit from acute therapy to maximize mobility, function and gait. Encouraged daily ambulation acutely.   sats 98% on RA HR 81 with gait    Follow Up Recommendations Home health PT    Equipment Recommendations  None recommended by PT    Recommendations for Other Services OT consult     Precautions / Restrictions Precautions Precautions: Fall      Mobility  Bed Mobility               General bed mobility comments: EOB on arrival  Transfers Overall transfer level: Modified independent                  Ambulation/Gait Ambulation/Gait assistance: Min guard Ambulation Distance (Feet): 200 Feet Assistive device: None Gait Pattern/deviations: Step-through pattern;Decreased stride length;Wide base of support   Gait velocity interpretation: Below normal speed for age/gender General Gait Details: increased sway to her gait with 2 periods of stopping to hold rail for balance. Cues for breathing technique and safety to maintain sats  Stairs Stairs: Yes Stairs assistance: Min assist Stair Management: Forwards;No rails Number of Stairs: 2 General stair comments: Pt without rails at home with difficulty placing hand on wall and having one HHA to step up onto stair and significant assist to clear foot and maintain balance. Attempted backward but pt  unable. Pt educated for need to have spouse assist at home for entering/exiting as well as recommendation to have [place] installed. cues for sequence   Wheelchair Mobility    Modified Rankin (Stroke Patients Only)       Balance Overall balance assessment: Needs assistance   Sitting balance-Leahy Scale: Good       Standing balance-Leahy Scale: Good                               Pertinent Vitals/Pain Pain Assessment: No/denies pain    Home Living Family/patient expects to be discharged to:: Private residence   Available Help at Discharge: Family;Available PRN/intermittently Type of Home: House Home Access: Stairs to enter Entrance Stairs-Rails: None Entrance Stairs-Number of Steps: 3 Home Layout: One level Home Equipment: Walker - 2 wheels;Bedside commode;Cane - single point      Prior Function Level of Independence: Independent               Hand Dominance        Extremity/Trunk Assessment   Upper Extremity Assessment Upper Extremity Assessment: Overall WFL for tasks assessed    Lower Extremity Assessment Lower Extremity Assessment: Generalized weakness    Cervical / Trunk Assessment Cervical / Trunk Assessment: Lordotic (large sacral shelf)  Communication   Communication: No difficulties  Cognition Arousal/Alertness: Awake/alert Behavior During Therapy: Anxious Overall Cognitive Status: Within Functional Limits for tasks assessed  General Comments      Exercises     Assessment/Plan    PT Assessment Patient needs continued PT services  PT Problem List Decreased strength;Decreased mobility;Decreased safety awareness;Decreased activity tolerance;Decreased balance       PT Treatment Interventions Gait training;Therapeutic exercise;Patient/family education;DME instruction;Therapeutic activities;Stair training;Functional mobility training    PT Goals (Current goals can be found in the Care Plan  section)  Acute Rehab PT Goals Patient Stated Goal: return home and to work PT Goal Formulation: With patient Time For Goal Achievement: 10/06/16 Potential to Achieve Goals: Good    Frequency Min 3X/week   Barriers to discharge Decreased caregiver support spouse works and can pop in throughout the day    Co-evaluation               End of Session Equipment Utilized During Treatment: Gait belt Activity Tolerance: Patient tolerated treatment well Patient left: in bed;with call bell/phone within reach Nurse Communication: Mobility status PT Visit Diagnosis: Difficulty in walking, not elsewhere classified (R26.2);Muscle weakness (generalized) (M62.81)         Time: 0981-1914 PT Time Calculation (min) (ACUTE ONLY): 24 min   Charges:   PT Evaluation $PT Eval Moderate Complexity: 1 Procedure PT Treatments $Gait Training: 8-22 mins   PT G Codes:         Kelilah Hebard B Taneisha Fuson 12-Oct-2016, 11:31 AM Elwyn Reach, [place]

## 2016-09-29 NOTE — Telephone Encounter (Signed)
Received fax today stating that the pt has been approved for pt assistance through [employer] and will receive Eliquis free of charge from 09/24/16-07/12/17.

## 2016-09-30 LAB — HEMOGLOBIN A1C
HEMOGLOBIN A1C: 6 % — AB (ref 4.8–5.6)
MEAN PLASMA GLUCOSE: 126 mg/dL

## 2016-10-01 DIAGNOSIS — Z09 Encounter for follow-up examination after completed treatment for conditions other than malignant neoplasm: Secondary | ICD-10-CM | POA: Diagnosis not present

## 2016-10-01 LAB — CULTURE, BLOOD (ROUTINE X 2)
CULTURE: NO GROWTH
Culture: NO GROWTH

## 2016-10-06 DIAGNOSIS — I10 Essential (primary) hypertension: Secondary | ICD-10-CM | POA: Diagnosis not present

## 2016-10-06 DIAGNOSIS — Z09 Encounter for follow-up examination after completed treatment for conditions other than malignant neoplasm: Secondary | ICD-10-CM | POA: Diagnosis not present

## 2016-10-20 DIAGNOSIS — I4891 Unspecified atrial fibrillation: Secondary | ICD-10-CM | POA: Diagnosis not present

## 2016-10-20 DIAGNOSIS — E7439 Other disorders of intestinal carbohydrate absorption: Secondary | ICD-10-CM | POA: Diagnosis not present

## 2016-10-24 ENCOUNTER — Observation Stay (HOSPITAL_COMMUNITY): Payer: PPO

## 2016-10-24 ENCOUNTER — Inpatient Hospital Stay (HOSPITAL_COMMUNITY)
Admission: EM | Admit: 2016-10-24 | Discharge: 2016-10-27 | DRG: 291 | Disposition: A | Discharge status: Home Health | Payer: PPO | Attending: Internal Medicine | Admitting: Internal Medicine

## 2016-10-24 ENCOUNTER — Encounter (HOSPITAL_COMMUNITY): Payer: Self-pay | Admitting: Emergency Medicine

## 2016-10-24 ENCOUNTER — Emergency Department (HOSPITAL_COMMUNITY): Payer: PPO

## 2016-10-24 DIAGNOSIS — R7989 Other specified abnormal findings of blood chemistry: Secondary | ICD-10-CM | POA: Diagnosis present

## 2016-10-24 DIAGNOSIS — J9690 Respiratory failure, unspecified, unspecified whether with hypoxia or hypercapnia: Secondary | ICD-10-CM | POA: Diagnosis not present

## 2016-10-24 DIAGNOSIS — Z7901 Long term (current) use of anticoagulants: Secondary | ICD-10-CM

## 2016-10-24 DIAGNOSIS — E662 Morbid (severe) obesity with alveolar hypoventilation: Secondary | ICD-10-CM | POA: Diagnosis not present

## 2016-10-24 DIAGNOSIS — R109 Unspecified abdominal pain: Secondary | ICD-10-CM

## 2016-10-24 DIAGNOSIS — Z79899 Other long term (current) drug therapy: Secondary | ICD-10-CM

## 2016-10-24 DIAGNOSIS — M5137 Other intervertebral disc degeneration, lumbosacral region: Secondary | ICD-10-CM | POA: Diagnosis not present

## 2016-10-24 DIAGNOSIS — J9600 Acute respiratory failure, unspecified whether with hypoxia or hypercapnia: Secondary | ICD-10-CM

## 2016-10-24 DIAGNOSIS — I110 Hypertensive heart disease with heart failure: Secondary | ICD-10-CM | POA: Diagnosis not present

## 2016-10-24 DIAGNOSIS — Z6841 Body Mass Index (BMI) 40.0 and over, adult: Secondary | ICD-10-CM | POA: Diagnosis not present

## 2016-10-24 DIAGNOSIS — R062 Wheezing: Secondary | ICD-10-CM | POA: Diagnosis not present

## 2016-10-24 DIAGNOSIS — I5033 Acute on chronic diastolic (congestive) heart failure: Secondary | ICD-10-CM

## 2016-10-24 DIAGNOSIS — Z801 Family history of malignant neoplasm of trachea, bronchus and lung: Secondary | ICD-10-CM

## 2016-10-24 DIAGNOSIS — N200 Calculus of kidney: Secondary | ICD-10-CM | POA: Diagnosis present

## 2016-10-24 DIAGNOSIS — E669 Obesity, unspecified: Secondary | ICD-10-CM | POA: Diagnosis present

## 2016-10-24 DIAGNOSIS — R0602 Shortness of breath: Secondary | ICD-10-CM | POA: Diagnosis not present

## 2016-10-24 DIAGNOSIS — Z87442 Personal history of urinary calculi: Secondary | ICD-10-CM

## 2016-10-24 DIAGNOSIS — J9601 Acute respiratory failure with hypoxia: Secondary | ICD-10-CM | POA: Diagnosis not present

## 2016-10-24 DIAGNOSIS — I440 Atrioventricular block, first degree: Secondary | ICD-10-CM | POA: Diagnosis present

## 2016-10-24 DIAGNOSIS — Z8249 Family history of ischemic heart disease and other diseases of the circulatory system: Secondary | ICD-10-CM

## 2016-10-24 DIAGNOSIS — I5031 Acute diastolic (congestive) heart failure: Secondary | ICD-10-CM | POA: Diagnosis not present

## 2016-10-24 DIAGNOSIS — R0902 Hypoxemia: Secondary | ICD-10-CM | POA: Diagnosis not present

## 2016-10-24 DIAGNOSIS — Z90710 Acquired absence of both cervix and uterus: Secondary | ICD-10-CM

## 2016-10-24 DIAGNOSIS — I480 Paroxysmal atrial fibrillation: Secondary | ICD-10-CM | POA: Diagnosis not present

## 2016-10-24 DIAGNOSIS — I482 Chronic atrial fibrillation: Secondary | ICD-10-CM | POA: Diagnosis not present

## 2016-10-24 DIAGNOSIS — K5730 Diverticulosis of large intestine without perforation or abscess without bleeding: Secondary | ICD-10-CM | POA: Diagnosis not present

## 2016-10-24 DIAGNOSIS — I10 Essential (primary) hypertension: Secondary | ICD-10-CM | POA: Diagnosis not present

## 2016-10-24 DIAGNOSIS — Z96653 Presence of artificial knee joint, bilateral: Secondary | ICD-10-CM | POA: Diagnosis not present

## 2016-10-24 DIAGNOSIS — Z833 Family history of diabetes mellitus: Secondary | ICD-10-CM | POA: Diagnosis not present

## 2016-10-24 DIAGNOSIS — Z981 Arthrodesis status: Secondary | ICD-10-CM

## 2016-10-24 HISTORY — DX: Acute respiratory failure with hypoxia: J96.01

## 2016-10-24 LAB — CBC
HEMATOCRIT: 39.5 % (ref 36.0–46.0)
HEMOGLOBIN: 13 g/dL (ref 12.0–15.0)
MCH: 29.5 pg (ref 26.0–34.0)
MCHC: 32.9 g/dL (ref 30.0–36.0)
MCV: 89.8 fL (ref 78.0–100.0)
Platelets: 328 10*3/uL (ref 150–400)
RBC: 4.4 MIL/uL (ref 3.87–5.11)
RDW: 13 % (ref 11.5–15.5)
WBC: 9.7 10*3/uL (ref 4.0–10.5)

## 2016-10-24 LAB — BRAIN NATRIURETIC PEPTIDE: B NATRIURETIC PEPTIDE 5: 57.9 pg/mL (ref 0.0–100.0)

## 2016-10-24 LAB — BASIC METABOLIC PANEL
ANION GAP: 10 (ref 5–15)
BUN: 13 mg/dL (ref 6–20)
CALCIUM: 9.2 mg/dL (ref 8.9–10.3)
CO2: 26 mmol/L (ref 22–32)
Chloride: 104 mmol/L (ref 101–111)
Creatinine, Ser: 0.77 mg/dL (ref 0.44–1.00)
Glucose, Bld: 152 mg/dL — ABNORMAL HIGH (ref 65–99)
POTASSIUM: 3.5 mmol/L (ref 3.5–5.1)
SODIUM: 140 mmol/L (ref 135–145)

## 2016-10-24 LAB — I-STAT TROPONIN, ED: TROPONIN I, POC: 0 ng/mL (ref 0.00–0.08)

## 2016-10-24 LAB — GLUCOSE, CAPILLARY: GLUCOSE-CAPILLARY: 318 mg/dL — AB (ref 65–99)

## 2016-10-24 LAB — LACTIC ACID, PLASMA: Lactic Acid, Venous: 5.2 mmol/L (ref 0.5–1.9)

## 2016-10-24 LAB — MAGNESIUM: Magnesium: 2 mg/dL (ref 1.7–2.4)

## 2016-10-24 LAB — I-STAT CG4 LACTIC ACID, ED
Lactic Acid, Venous: 2.5 mmol/L (ref 0.5–1.9)
Lactic Acid, Venous: 3.84 mmol/L (ref 0.5–1.9)

## 2016-10-24 MED ORDER — DEXTROSE 5 % IV SOLN
1.0000 g | Freq: Three times a day (TID) | INTRAVENOUS | Status: DC
  Administered 2016-10-24 – 2016-10-26 (×5): 1 g via INTRAVENOUS
  Filled 2016-10-24 (×6): qty 1

## 2016-10-24 MED ORDER — ORAL CARE MOUTH RINSE
15.0000 mL | Freq: Two times a day (BID) | OROMUCOSAL | Status: DC
  Administered 2016-10-25 – 2016-10-27 (×5): 15 mL via OROMUCOSAL

## 2016-10-24 MED ORDER — SODIUM CHLORIDE 0.9 % IV SOLN: 250.0000 mL | INTRAVENOUS | Status: DC | PRN

## 2016-10-24 MED ORDER — SODIUM CHLORIDE 0.9% FLUSH
3.0000 mL | Freq: Two times a day (BID) | INTRAVENOUS | Status: DC
  Administered 2016-10-24 – 2016-10-27 (×6): 3 mL via INTRAVENOUS

## 2016-10-24 MED ORDER — FUROSEMIDE 10 MG/ML IJ SOLN
40.0000 mg | Freq: Once | INTRAMUSCULAR | Status: AC
  Administered 2016-10-24: 40 mg via INTRAVENOUS
  Filled 2016-10-24: qty 4

## 2016-10-24 MED ORDER — ALBUTEROL SULFATE (2.5 MG/3ML) 0.083% IN NEBU
2.5000 mg | INHALATION_SOLUTION | Freq: Four times a day (QID) | RESPIRATORY_TRACT | Status: DC | PRN
Start: 2016-10-24 — End: 2016-10-27

## 2016-10-24 MED ORDER — POTASSIUM CHLORIDE CRYS ER 20 MEQ PO TBCR
40.0000 meq | EXTENDED_RELEASE_TABLET | Freq: Two times a day (BID) | ORAL | Status: DC
Start: 2016-10-24 — End: 2016-10-25
  Administered 2016-10-24: 40 meq via ORAL
  Filled 2016-10-24 (×2): qty 2

## 2016-10-24 MED ORDER — LORAZEPAM 1 MG PO TABS
0.5000 mg | ORAL_TABLET | Freq: Once | ORAL | Status: AC
  Administered 2016-10-24: 0.5 mg via ORAL
  Filled 2016-10-24: qty 1

## 2016-10-24 MED ORDER — ACETAMINOPHEN 325 MG PO TABS
650.0000 mg | ORAL_TABLET | ORAL | Status: DC | PRN
  Administered 2016-10-25: 650 mg via ORAL
  Filled 2016-10-24: qty 2

## 2016-10-24 MED ORDER — DICLOFENAC SODIUM 1 % TD GEL
2.0000 g | Freq: Four times a day (QID) | TRANSDERMAL | Status: DC
  Administered 2016-10-24 – 2016-10-27 (×6): 2 g via TOPICAL
  Filled 2016-10-24 (×2): qty 100

## 2016-10-24 MED ORDER — NITROGLYCERIN 2 % TD OINT
1.0000 [in_us] | TOPICAL_OINTMENT | Freq: Four times a day (QID) | TRANSDERMAL | Status: DC
  Administered 2016-10-25 (×3): 1 [in_us] via TOPICAL
  Filled 2016-10-24: qty 30

## 2016-10-24 MED ORDER — METHYLPREDNISOLONE SODIUM SUCC 125 MG IJ SOLR
125.0000 mg | Freq: Once | INTRAMUSCULAR | Status: DC
  Filled 2016-10-24: qty 2

## 2016-10-24 MED ORDER — APIXABAN 5 MG PO TABS
5.0000 mg | ORAL_TABLET | Freq: Two times a day (BID) | ORAL | Status: DC
  Administered 2016-10-24 – 2016-10-27 (×6): 5 mg via ORAL
  Filled 2016-10-24 (×6): qty 1

## 2016-10-24 MED ORDER — DEXTROSE 5 % IV SOLN
1.0000 g | Freq: Once | INTRAVENOUS | Status: DC
  Filled 2016-10-24: qty 1

## 2016-10-24 MED ORDER — ALBUTEROL (5 MG/ML) CONTINUOUS INHALATION SOLN
10.0000 mg/h | INHALATION_SOLUTION | Freq: Once | RESPIRATORY_TRACT | Status: AC
  Administered 2016-10-24: 10 mg/h via RESPIRATORY_TRACT
  Filled 2016-10-24: qty 20

## 2016-10-24 MED ORDER — IPRATROPIUM-ALBUTEROL 0.5-2.5 (3) MG/3ML IN SOLN
3.0000 mL | Freq: Once | RESPIRATORY_TRACT | Status: AC
  Administered 2016-10-24: 3 mL via RESPIRATORY_TRACT
  Filled 2016-10-24: qty 3

## 2016-10-24 MED ORDER — ONDANSETRON HCL 4 MG/2ML IJ SOLN: 4.0000 mg | Freq: Four times a day (QID) | INTRAMUSCULAR | Status: DC | PRN

## 2016-10-24 MED ORDER — LORAZEPAM 1 MG PO TABS
1.0000 mg | ORAL_TABLET | Freq: Once | ORAL | Status: AC
  Administered 2016-10-24: 1 mg via ORAL
  Filled 2016-10-24: qty 1

## 2016-10-24 MED ORDER — FUROSEMIDE 10 MG/ML IJ SOLN
40.0000 mg | Freq: Two times a day (BID) | INTRAMUSCULAR | Status: AC
  Administered 2016-10-24 – 2016-10-25 (×3): 40 mg via INTRAVENOUS
  Filled 2016-10-24 (×3): qty 4

## 2016-10-24 MED ORDER — DILTIAZEM HCL ER COATED BEADS 120 MG PO CP24
120.0000 mg | ORAL_CAPSULE | Freq: Every day | ORAL | Status: DC
  Administered 2016-10-25 – 2016-10-27 (×3): 120 mg via ORAL
  Filled 2016-10-24 (×3): qty 1

## 2016-10-24 MED ORDER — SODIUM CHLORIDE 0.9% FLUSH: 3.0000 mL | INTRAVENOUS | Status: DC | PRN

## 2016-10-24 MED ORDER — IPRATROPIUM BROMIDE 0.02 % IN SOLN
0.5000 mg | Freq: Once | RESPIRATORY_TRACT | Status: AC
  Administered 2016-10-24: 0.5 mg via RESPIRATORY_TRACT
  Filled 2016-10-24: qty 2.5

## 2016-10-24 MED ORDER — PREDNISONE 20 MG PO TABS
60.0000 mg | ORAL_TABLET | Freq: Once | ORAL | Status: AC
  Administered 2016-10-24: 60 mg via ORAL
  Filled 2016-10-24: qty 3

## 2016-10-24 NOTE — ED Triage Notes (Signed)
Pt. Stated, I started having SOB with some wheezing and I might be in A-Fib.

## 2016-10-24 NOTE — ED Notes (Signed)
Lactic acid result given to Dr. Venora Maples

## 2016-10-24 NOTE — H&P (Signed)
History and Physical    [name] XKG:[id number] DOB: [birth year] DOA: 10/24/2016   PCP: Jani Gravel, MD   Patient coming from/Resides with: Private residence/husband  Admission status: Observation/telemetry -it may be medically necessary to stay a minimum 2 midnights to rule out impending and/or unexpected changes in physiologic status that may differ from initial evaluation performed in the ER and/or at time of admission therefore consider reevaluation of admission status in 24 hours.   Chief Complaint: Dyspnea on exertion and orthopnea  HPI: [name] is a 71 y.o. female with medical history significant for chronic atrial fibrillation on eliquis, chronic diastolic heart failure, obesity and hypertension. Patient was recently discharged on 3/20 after an admission for bronchitis. She was discharged on a prednisone taper. Since discharge patient has had progressive shortness of breath, increase in weight and subsequently has developed significant dyspnea on exertion and orthopnea that has worsened over the past 48 hours. In review of the weight graph, in mid-March her weight was 230 lbs and current weight is 249 lbs. She has had a nonproductive, dry cough. She has not had any fevers or chills.  ED Course:  Vital Signs: BP (!) 123/42   Pulse 91   Temp 98.5 F (36.9 C) (Oral)   Resp (!) 23   Ht 5\' 2"  (1.575 m)   Wt 112.9 kg (249 lb)   SpO2 97%   BMI 45.54 kg/m  2 view CXR: Neg  Lab data: Na 140, K 3.5, Cl 104, CO2 26, Gluc 152, BUN 13, Cr 0.77, BNP 58, poc TNI 0.00, Lactic acid 2.50, WBC 9700, Hgb 13, platelets 328,000  Medications and treatments: Albuterol tenuous neb 10 mg/hr, Atrovent neb 0.51, Ativan tablet 0.5 mg 1, prednisone 60 mg tablet 1, DuoNeb 1  Review of Systems:  In addition to the HPI above,  No Fever-chills, myalgias or other constitutional symptoms No Headache, changes with Vision or hearing, new weakness, tingling, numbness in any extremity, dizziness,  dysarthria or word finding difficulty, gait disturbance or imbalance, tremors or seizure activity No problems swallowing food or Liquids, indigestion/reflux, choking or coughing while eating, abdominal pain with or after eating No Chest pain, palpitations No Abdominal pain, N/V, melena,hematochezia, dark tarry stools, constipation No dysuria, malodorous urine, hematuria or flank pain No new skin rashes, lesions, masses or bruises, No new joint pains, aches, swelling or redness No recent unintentional weight gain or loss No polyuria, polydypsia or polyphagia   Past Medical History:  Diagnosis Date  . Arthritis    both knees  . Carpal tunnel syndrome, bilateral    both hands go to sleep at times from elbows down  . DDD (degenerative disc disease), lumbosacral   . First degree AV block   . History of kidney stones   . PONV (postoperative nausea and vomiting)   . Postlaminectomy syndrome   . Renal calculi    BILATERAL  . Right ureteral stone     Past Surgical History:  Procedure Laterality Date  . ANTERIOR CERVICAL DECOMP/DISCECTOMY FUSION  03-18-2011   C3 -- C7  . APPENDECTOMY    . CARDIAC CATHETERIZATION N/A 01/16/2015   Procedure: Left Heart Cath and Coronary Angiography;  Surgeon: Belva Crome, MD;  Location: [facility];  Service: Cardiovascular;  Laterality: N/A;  . CHOLECYSTECTOMY OPEN  1970'S  . CYSTOSCOPY WITH RETROGRADE PYELOGRAM, URETEROSCOPY AND STENT PLACEMENT N/A 02/13/2013   Procedure: CYSTOSCOPY WITH RETROGRADE PYELOGRAM, URETEROSCOPY AND LITHOTRIPSY ;  Surgeon: Claybon Jabs, MD;  Location: [facility];  Service: Urology;  Laterality: N/A;  . EXTRACORPOREAL SHOCK WAVE LITHOTRIPSY Right 05-16-2012  . HOLMIUM LASER APPLICATION Right 7/0/0174   Procedure: HOLMIUM LASER APPLICATION;  Surgeon: Claybon Jabs, MD;  Location: [facility];  Service: Urology;  Laterality: Right;  . KNEE ARTHROSCOPY Bilateral   . LUMBAR LAMINECTOMY  X2   1960'S  . NASAL SEPTUM SURGERY  1970'S  . SHOULDER OPEN ROTATOR CUFF REPAIR Bilateral LEFT 01-11-2001/   RIGHT 07-21-2002  . TOTAL KNEE ARTHROPLASTY  04/13/2012   Procedure: TOTAL KNEE ARTHROPLASTY;  Surgeon: Gearlean Alf, MD;  Location: [facility];  Service: Orthopedics;  Laterality: Left;  . TOTAL KNEE ARTHROPLASTY  08/08/2012   Procedure: TOTAL KNEE ARTHROPLASTY;  Surgeon: Gearlean Alf, MD;  Location: [facility];  Service: Orthopedics;  Laterality: Right;  . TRANSTHORACIC ECHOCARDIOGRAM  03-29-2011   MODERATE LVH/ EF 60-65%/ MILD MR  . VAGINAL HYSTERECTOMY  1960'S    Social History   Social History  . Marital status: Married    Spouse name: N/A  . Number of children: N/A  . Years of education: N/A   Occupational History  . Not on file.   Social History Main Topics  . Smoking status: Never Smoker  . Smokeless tobacco: Never Used  . Alcohol use No  . Drug use: No  . Sexual activity: Not on file   Other Topics Concern  . Not on file   Social History Narrative  . No narrative on file    Mobility: Mobilizes this independently without assistive devices Work history: Not obtained   No Known Allergies  Family History  Problem Relation Age of Onset  . Diabetes type II Mother   . Diabetes Sister   . Hypertension Sister   . Lung cancer Father   . Hypertension Brother   . Diabetes Brother   . Heart disease Brother   . Diabetes Brother   . Hypertension Brother   . Heart disease Brother      Prior to Admission medications   Medication Sig Start Date End Date Taking? Authorizing Provider  acetaminophen (TYLENOL) 325 MG tablet Take 650 mg by mouth every 6 (six) hours as needed for mild pain.   Yes Historical Provider, MD  albuterol (PROVENTIL HFA;VENTOLIN HFA) 108 (90 Base) MCG/ACT inhaler Inhale 1 puff into the lungs every 6 (six) hours as needed for wheezing or shortness of breath.   Yes Historical Provider, MD  apixaban (ELIQUIS) 5 MG TABS tablet Take 1 tablet (5 mg  total) by mouth 2 (two) times daily. 09/14/16  Yes Belva Crome, MD  chlorpheniramine-HYDROcodone [facility] PENNKINETIC ER) 10-8 MG/5ML SUER Take 5 mLs by mouth every 12 (twelve) hours as needed for cough.   Yes Historical Provider, MD  diclofenac sodium (VOLTAREN) 1 % GEL Apply 2 g topically daily as needed for pain. 10/14/16  Yes Historical Provider, MD  diltiazem (CARTIA XT) 120 MG 24 hr capsule Take 1 capsule (120 mg total) by mouth daily. 01/31/16  Yes Belva Crome, MD  furosemide (LASIX) 20 MG tablet Take 20 mg by mouth daily as needed for fluid. 10/19/16  Yes Historical Provider, MD  azithromycin (ZITHROMAX) 500 MG tablet Take 1 tablet (500 mg total) by mouth daily. Patient not taking: Reported on 10/24/2016 09/29/16   Shon Millet, DO  predniSONE (DELTASONE) 10 MG tablet Take 4 tabs for 3 days, then 3 tabs for 3 days, then 2 tabs for 3 days, then 1  tab for 3 days, then 1/2 tab for 4 days. Patient not taking: Reported on 10/24/2016 09/29/16   Shon Millet, DO    Physical Exam: Vitals:   10/24/16 1246 10/24/16 1250 10/24/16 1400 10/24/16 1530  BP: 137/78  (!) 147/92 (!) 123/42  Pulse: 82  81 91  Resp: 20  (!) 22 (!) 23  Temp: 98.5 F (36.9 C)     TempSrc: Oral     SpO2: 96%  100% 97%  Weight:  112.9 kg (249 lb)    Height:          Constitutional: NAD, calm, Mildly uncomfortable in setting of ongoing dyspnea Eyes: PERRL, lids and conjunctivae normal ENMT: Mucous membranes are moist. Posterior pharynx clear of any exudate or lesions.Normal dentition.  Neck: normal, supple, no masses, no thyromegaly Respiratory: Bilateral inspiratory and asked very wheezing with expiratory crackles, Normal respiratory effort but does have mild resting tachypnea without accessory muscle use. Room air saturations 89% with ambulation to bathroom improved and 64% after application 2 L oxygen Cardiovascular: Regular rate and rhythm, no murmurs / rubs / gallops. Puffy nonpitting bilateral  lower extremity edema most notable at ankles. 2+ pedal pulses. No carotid bruits.  Abdomen: no tenderness, no masses palpated. No hepatosplenomegaly. Bowel sounds positive.  Musculoskeletal: no clubbing / cyanosis. No joint deformity upper and lower extremities. Good ROM, no contractures. Normal muscle tone.  Skin: no rashes, lesions, ulcers. No induration Neurologic: CN 2-12 grossly intact. Sensation intact, DTR normal. Strength 5/5 x all 4 extremities.  Psychiatric: Normal judgment and insight. Alert and oriented x 3. Normal mood.    Labs on Admission: I have personally reviewed following labs and imaging studies  CBC:  Recent Labs Lab 10/24/16 1248  WBC 9.7  HGB 13.0  HCT 39.5  MCV 89.8  PLT 403   Basic Metabolic Panel:  Recent Labs Lab 10/24/16 1248  NA 140  K 3.5  CL 104  CO2 26  GLUCOSE 152*  BUN 13  CREATININE 0.77  CALCIUM 9.2   GFR: Estimated Creatinine Clearance: 75.5 mL/min (by C-G formula based on SCr of 0.77 mg/dL). Liver Function Tests: No results for input(s): AST, ALT, ALKPHOS, BILITOT, PROT, ALBUMIN in the last 168 hours. No results for input(s): LIPASE, AMYLASE in the last 168 hours. No results for input(s): AMMONIA in the last 168 hours. Coagulation Profile: No results for input(s): INR, PROTIME in the last 168 hours. Cardiac Enzymes: No results for input(s): CKTOTAL, CKMB, CKMBINDEX, TROPONINI in the last 168 hours. BNP (last 3 results) No results for input(s): PROBNP in the last 8760 hours. HbA1C: No results for input(s): HGBA1C in the last 72 hours. CBG: No results for input(s): GLUCAP in the last 168 hours. Lipid Profile: No results for input(s): CHOL, HDL, LDLCALC, TRIG, CHOLHDL, LDLDIRECT in the last 72 hours. Thyroid Function Tests: No results for input(s): TSH, T4TOTAL, FREET4, T3FREE, THYROIDAB in the last 72 hours. Anemia Panel: No results for input(s): VITAMINB12, FOLATE, FERRITIN, TIBC, IRON, RETICCTPCT in the last 72  hours. Urine analysis:    Component Value Date/Time   COLORURINE YELLOW 07/29/2012 1128   APPEARANCEUR CLEAR 07/29/2012 1128   LABSPEC 1.022 07/29/2012 1128   PHURINE 5.5 07/29/2012 1128   GLUCOSEU NEGATIVE 07/29/2012 1128   HGBUR SMALL (A) 07/29/2012 1128   BILIRUBINUR NEGATIVE 07/29/2012 1128   KETONESUR NEGATIVE 07/29/2012 1128   PROTEINUR NEGATIVE 07/29/2012 1128   UROBILINOGEN 0.2 07/29/2012 1128   NITRITE NEGATIVE 07/29/2012 1128   LEUKOCYTESUR SMALL (A)  07/29/2012 1128   Sepsis Labs: @LABRCNTIP (procalcitonin:4,lacticidven:4) )No results found for this or any previous visit (from the past 240 hour(s)).   Radiological Exams on Admission: Dg Chest 2 View  Result Date: 10/24/2016 CLINICAL DATA:  71 year old female with progressive shortness of breath and wheezing today. Initial encounter. EXAM: CHEST  2 VIEW COMPARISON:  09/28/2016 and earlier. FINDINGS: Upright AP and lateral views of the chest. Stable mild cardiomegaly and mediastinal contours. No pneumothorax, pulmonary edema, pleural effusion or confluent pulmonary opacity. Cervical ACDF hardware again noted. No acute osseous abnormality identified. Negative visible bowel gas pattern. IMPRESSION: No acute cardiopulmonary abnormality. Electronically Signed   By: Genevie Ann M.D.   On: 10/24/2016 14:10    EKG: (Independently reviewed) Sinus rhythm with first degree AV block ventricular rate 86 bpm, QTC 454 ms, no acute ischemic changes and unchanged from previous EKG  Assessment/Plan Principal Problem:   Acute respiratory failure with hypoxia  -Patient presents with progressive dyspnea on exertion and orthopnea with associated hypoxemia and weight gain consistent with likely heart failure exacerbation -Continue supportive care (O2)-wean oxygen as tolerated -Treat underlying causes  Active Problems:   Elevated lactic acid level -Likely secondary to hypoperfusion in setting of hypoxemic respiratory failure and heart  failure -Repeat in a.m. -No fever, leukocytosis or other signs of infection    Acute on chronic diastolic heart failure  -Patient presents with acute hypoxemic respiratory failure with cardiac wheezing and increased weight consistent with heart failure exacerbation -Weight mid March 2018 230 pounds with current weight 249 pounds -Patient reported yesterday took one of her 20 mg Lasix tablets and noted increased urinary frequency ie q 2 hrs UOP -Lasix 40 mg IV q 12 hrs x more doses supplemental potassium; magnesium -Last echocardiogram 2015: Preserved LV function w/ grade 2 diastolic dysfunction -Repeat echocardiogram this admission -Additional differential includes viral cardiomyopathy given recent admission for viral bronchitis -No ARB, ACE inhibitor or beta blocker at this juncture-of note patient has first-degree AV block -Daily weights and strict I/O    Essential hypertension -Continue preadmission CCB    Chronic atrial fibrillation  -Currently maintaining sinus rhythm  -Continue CCB for rate control-if echo demonstrates systolic dysfunction need to consider changing to BB -Continue eliquis -CHADVASc = 4    Obesity -Weight reduction strategies per primary care physician      DVT prophylaxis: Eliquis  Code Status: Full  Family Communication: Husband Disposition Plan: Home Consults called: None     Aalyiah Camberos L. ANP-BC [facility] Pager [phone]   If 7PM-7AM, please contact night-coverage [email or web address] Password [facility]  10/24/2016, 4:51 PM

## 2016-10-24 NOTE — ED Provider Notes (Signed)
[facility] DEPT Provider Note   CSN: [id number] Arrival date & time: 10/24/16  1236     History   Chief Complaint Chief Complaint  Patient presents with  . Shortness of Breath  . Bronchitis  . Atrial Fibrillation    HPI [name] is a 71 y.o. female.  HPI  [name] is a 71 y.o. female with history of CHF, paroxysmal A. fib on Eliquis and Cardizem, hypertension, presents to emergency department complaining of cough and shortness of breath. Patient recently admitted to the hospital for the same. Discharged home on 09/29/2016. Was diagnosed with acute hypoxic respiratory failure secondary to acute bacterial bronchitis. Discharged home with antibiotics and prednisone. States finished all of the medications and felt better. She reports cough came back several days ago and began starting to feel short of breath again last night. Today her symptoms worsened. Patient states she is unable to breathe, reports associated wheezing, nonproductive cough. Denies fever or chills. No chest pain. No other associated symptoms. She does admit to increased lower leg swelling for which she has had to take Lasix  Past Medical History:  Diagnosis Date  . Arthritis    both knees  . Carpal tunnel syndrome, bilateral    both hands go to sleep at times from elbows down  . DDD (degenerative disc disease), lumbosacral   . First degree AV block   . History of kidney stones   . PONV (postoperative nausea and vomiting)   . Postlaminectomy syndrome   . Renal calculi    BILATERAL  . Right ureteral stone     Patient Active Problem List   Diagnosis Date Noted  . Acute respiratory distress 09/26/2016  . CAP (community acquired pneumonia) 09/26/2016  . Elevated lactic acid level 09/26/2016  . Anxiety 09/26/2016  . Chest pain 01/16/2015  . Abnormal myocardial perfusion study 01/16/2015  . Paroxysmal atrial fibrillation ([place]) 12/20/2014  . Chronic anticoagulation 12/20/2014  . Essential  hypertension 07/20/2014  . Obesity 05/04/2014  . Prediabetes 05/04/2014  . SIRS (systemic inflammatory response syndrome) ([place]) 05/04/2014  . Chronic diastolic heart failure ([place]) 05/03/2014  . Shortness of breath 05/02/2014  . Ureteral calculus, right 05/16/2012  . Hydronephrosis of right kidney 05/16/2012  . Renal calculus, left 05/16/2012  . Hematuria 04/19/2012  . Postop Transfusion 04/15/2012  . Postop Acute blood loss anemia 04/14/2012  . Postop Hypokalemia 04/14/2012  . OA (osteoarthritis) of knee 04/13/2012    Past Surgical History:  Procedure Laterality Date  . ANTERIOR CERVICAL DECOMP/DISCECTOMY FUSION  03-18-2011   C3 -- C7  . APPENDECTOMY    . CARDIAC CATHETERIZATION N/A 01/16/2015   Procedure: Left Heart Cath and Coronary Angiography;  Surgeon: Belva Crome, MD;  Location: [place] [facility];  Service: Cardiovascular;  Laterality: N/A;  . CHOLECYSTECTOMY OPEN  1970'S  . CYSTOSCOPY WITH RETROGRADE PYELOGRAM, URETEROSCOPY AND STENT PLACEMENT N/A 02/13/2013   Procedure: CYSTOSCOPY WITH RETROGRADE PYELOGRAM, URETEROSCOPY AND LITHOTRIPSY ;  Surgeon: Claybon Jabs, MD;  Location: [facility];  Service: Urology;  Laterality: N/A;  . EXTRACORPOREAL SHOCK WAVE LITHOTRIPSY Right 05-16-2012  . HOLMIUM LASER APPLICATION Right 08/14/252   Procedure: HOLMIUM LASER APPLICATION;  Surgeon: Claybon Jabs, MD;  Location: [facility];  Service: Urology;  Laterality: Right;  . KNEE ARTHROSCOPY Bilateral   . LUMBAR LAMINECTOMY  X2  1960'S  . NASAL SEPTUM SURGERY  1970'S  . SHOULDER OPEN ROTATOR CUFF REPAIR Bilateral LEFT 01-11-2001/   RIGHT 07-21-2002  .  TOTAL KNEE ARTHROPLASTY  04/13/2012   Procedure: TOTAL KNEE ARTHROPLASTY;  Surgeon: Gearlean Alf, MD;  Location[facility];  Service: Orthopedics;  Laterality: Left;  . TOTAL KNEE ARTHROPLASTY  08/08/2012   Procedure: TOTAL KNEE ARTHROPLASTY;  Surgeon: Gearlean Alf, MD;  Location: [facility];  Service: Orthopedics;   Laterality: Right;  . TRANSTHORACIC ECHOCARDIOGRAM  03-29-2011   MODERATE LVH/ EF 60-65%/ MILD MR  . VAGINAL HYSTERECTOMY  1960'S    OB History    No data available       Home Medications    Prior to Admission medications   Medication Sig Start Date End Date Taking? Authorizing Provider  acetaminophen (TYLENOL) 325 MG tablet Take 650 mg by mouth every 6 (six) hours as needed for mild pain.    Historical Provider, MD  albuterol (PROVENTIL HFA;VENTOLIN HFA) 108 (90 Base) MCG/ACT inhaler Inhale 1 puff into the lungs every 6 (six) hours as needed for wheezing or shortness of breath.    Historical Provider, MD  apixaban (ELIQUIS) 5 MG TABS tablet Take 1 tablet (5 mg total) by mouth 2 (two) times daily. 09/14/16   Belva Crome, MD  azithromycin (ZITHROMAX) 500 MG tablet Take 1 tablet (500 mg total) by mouth daily. 09/29/16   Shon Millet, DO  chlorpheniramine-HYDROcodone [facility] ER) 10-8 MG/5ML SUER Take 5 mLs by mouth every 12 (twelve) hours as needed for cough.    Historical Provider, MD  diltiazem (CARTIA XT) 120 MG 24 hr capsule Take 1 capsule (120 mg total) by mouth daily. 01/31/16   Belva Crome, MD  predniSONE (DELTASONE) 10 MG tablet Take 4 tabs for 3 days, then 3 tabs for 3 days, then 2 tabs for 3 days, then 1 tab for 3 days, then 1/2 tab for 4 days. 09/29/16   Shon Millet, DO    Family History Family History  Problem Relation Age of Onset  . Diabetes type II Mother   . Diabetes Sister   . Hypertension Sister   . Lung cancer Father   . Hypertension Brother   . Diabetes Brother   . Heart disease Brother   . Diabetes Brother   . Hypertension Brother   . Heart disease Brother     Social History Social History  Substance Use Topics  . Smoking status: Never Smoker  . Smokeless tobacco: Never Used  . Alcohol use No     Allergies   Patient has no known allergies.   Review of Systems Review of Systems  Constitutional: Negative  for chills and fever.  Respiratory: Positive for cough, shortness of breath and wheezing. Negative for chest tightness.   Cardiovascular: Positive for leg swelling. Negative for chest pain and palpitations.  Gastrointestinal: Negative for abdominal pain, diarrhea, nausea and vomiting.  Genitourinary: Negative for dysuria, flank pain and pelvic pain.  Musculoskeletal: Negative for arthralgias, myalgias, neck pain and neck stiffness.  Skin: Negative for rash.  Neurological: Negative for dizziness, weakness and headaches.  All other systems reviewed and are negative.    Physical Exam Updated Vital Signs BP 137/78 (BP Location: Right Arm)   Pulse 82   Temp 98.5 F (36.9 C) (Oral)   Resp 20   Ht 5\' 2"  (1.575 m)   Wt 112.9 kg   SpO2 96%   BMI 45.54 kg/m   Physical Exam  Constitutional: She appears well-developed and well-nourished. No distress.  HENT:  Head: Normocephalic.  Eyes: Conjunctivae are normal.  Neck: Neck supple.  Cardiovascular:  Normal rate, regular rhythm and normal heart sounds.   Pulmonary/Chest: No respiratory distress. She has wheezes. She has no rales.  Speaking in 3-4 word sentences, audible inspiratory and expiratory wheezing in all lung fields  Abdominal: Soft. Bowel sounds are normal. She exhibits no distension. There is no tenderness. There is no rebound.  Musculoskeletal: She exhibits edema.  1+ peripheral edema bilaterally  Neurological: She is alert.  Skin: Skin is warm and dry.  Psychiatric: She has a normal mood and affect. Her behavior is normal.  Nursing note and vitals reviewed.    ED Treatments / Results  Labs (all labs ordered are listed, but only abnormal results are displayed) Labs Reviewed  BASIC METABOLIC PANEL - Abnormal; Notable for the following:       Result Value   Glucose, Bld 152 (*)    All other components within normal limits  I-STAT CG4 LACTIC ACID, ED - Abnormal; Notable for the following:    Lactic Acid, Venous 2.50 (*)      All other components within normal limits  CBC  BRAIN NATRIURETIC PEPTIDE  I-STAT TROPOININ, ED  I-STAT CG4 LACTIC ACID, ED    EKG  EKG Interpretation None       Radiology No results found.  Procedures Procedures (including critical care time)  Medications Ordered in ED Medications  albuterol (PROVENTIL,VENTOLIN) solution continuous neb (not administered)  ipratropium (ATROVENT) nebulizer solution 0.5 mg (not administered)  methylPREDNISolone sodium succinate (SOLU-MEDROL) 125 mg/2 mL injection 125 mg (not administered)  LORazepam (ATIVAN) tablet 0.5 mg (not administered)     Initial Impression / Assessment and Plan / ED Course  I have reviewed the triage vital signs and the nursing notes.  Pertinent labs & imaging results that were available during my care of the patient were reviewed by me and considered in my medical decision making (see chart for details).    Patient seen and examined. Patient with significant shortness of breath, vital signs are normal at this time. She does have audible inspiratory and expiratory wheezing bilaterally. We will try nebulizer treatments, steroids, lab work ordered. Will do repeat chest x-ray.  3:21 PM Pt finished her breathing treatment. She is feeling much better. Still some wheezing on exam. CXR negative. Lactic 2.5, improved from the admission lactic acid. Will monitor and ambulate  4:07 PM Pt still very short of breath, still wheezing. Will give another duoneb. Will admit  I ambulated pt myself, she dropped oxygen sat to high 80s.   Vitals:   10/24/16 1244 10/24/16 1246 10/24/16 1250  BP:  137/78   Pulse:  82   Resp:  20   Temp:  98.5 F (36.9 C)   TempSrc:  Oral   SpO2:  96%   Weight:   112.9 kg  Height: 5\' 2"  (1.575 m)       Final Clinical Impressions(s) / ED Diagnoses   Final diagnoses:  Wheezing  Hypoxia    New Prescriptions New Prescriptions   No medications on file     Jeannett Senior,  PA-C 10/24/16 [place], MD 10/25/16 1445

## 2016-10-24 NOTE — ED Notes (Signed)
Copy of lactic acid results given to Dr Venora Maples 3.84

## 2016-10-24 NOTE — ED Notes (Signed)
Pt to bathroom on RA. Upon return to bed, pt O2 sats = 89% on RA. Pt placed on 2L O2, sats improved to 97%.

## 2016-10-24 NOTE — ED Notes (Signed)
IV attempted x2. Spoke with PA and changing to PO steroids. Will reassess need for IV after breathing treatment.

## 2016-10-24 NOTE — Progress Notes (Signed)
Patient new admission to [facility] from Emergency Room.  Patient up to bathroom as soon as arrived on unit, patient noted to be dyspneic with exertion, O2 sats 98% on room air.  VSS, heart rate tachy up to 120 after walking but gradually came back down to 100 with rest.  Patient expressed that she is anxious and that the doctor was to order her something for anxiety.  Nothing ordered, text page sent to MD.  MD responded with order for Ativan.  Husband at bedside.  Did apply 2 liters O2 for comfort.

## 2016-10-24 NOTE — Progress Notes (Signed)
CRITICAL VALUE ALERT  Critical value received: lactic acid=5.2  Date of notification:  10/24/16  Time of notification:  2126  Critical value read back:Yes.    Nurse who received alert:  Eldridge Dace  MD notified (1st page):  Hal Hope  Time of first page:  2126  MD notified (2nd page):  Time of second page:  Responding MD:  Colin Rhein  Time MD responded:  2128

## 2016-10-24 NOTE — ED Notes (Signed)
ED Provider at bedside. 

## 2016-10-24 NOTE — Progress Notes (Addendum)
Pharmacy Antibiotic Note  [name] is a 71 y.o. female  with bronchitis and failed oupatient azithromycin. Pharmacy has been consulted for cefepime dosing. -WBC= 9.7, afeb, LA= 3.84  Plan: -Cefepime 1gm IV q8h -Will follow renal function, cultures and clinical progress  Height: 5\' 2"  (157.5 cm) Weight: 249 lb (112.9 kg) IBW/kg (Calculated) : 50.1  Temp (24hrs), Avg:98.5 F (36.9 C), Min:98.5 F (36.9 C), Max:98.5 F (36.9 C)   Recent Labs Lab 10/24/16 1248 10/24/16 1329 10/24/16 1714  WBC 9.7  --   --   CREATININE 0.77  --   --   LATICACIDVEN  --  2.50* 3.84*    Estimated Creatinine Clearance: 75.5 mL/min (by C-G formula based on SCr of 0.77 mg/dL).    No Known Allergies  Antimicrobials this admission: 4/14 cefepime  Dose adjustments this admission:  Microbiology results:  Thank you for allowing pharmacy to be a part of this patient's care.  Hildred Laser, Pharm D 10/24/2016 6:38 PM

## 2016-10-25 ENCOUNTER — Encounter (HOSPITAL_COMMUNITY): Payer: Self-pay | Admitting: Internal Medicine

## 2016-10-25 ENCOUNTER — Observation Stay (HOSPITAL_BASED_OUTPATIENT_CLINIC_OR_DEPARTMENT_OTHER): Payer: PPO

## 2016-10-25 DIAGNOSIS — R7989 Other specified abnormal findings of blood chemistry: Secondary | ICD-10-CM

## 2016-10-25 DIAGNOSIS — Z7901 Long term (current) use of anticoagulants: Secondary | ICD-10-CM | POA: Diagnosis not present

## 2016-10-25 DIAGNOSIS — R109 Unspecified abdominal pain: Secondary | ICD-10-CM | POA: Diagnosis not present

## 2016-10-25 DIAGNOSIS — Z79899 Other long term (current) drug therapy: Secondary | ICD-10-CM | POA: Diagnosis not present

## 2016-10-25 DIAGNOSIS — I5031 Acute diastolic (congestive) heart failure: Secondary | ICD-10-CM

## 2016-10-25 DIAGNOSIS — E662 Morbid (severe) obesity with alveolar hypoventilation: Secondary | ICD-10-CM | POA: Diagnosis not present

## 2016-10-25 DIAGNOSIS — Z96653 Presence of artificial knee joint, bilateral: Secondary | ICD-10-CM | POA: Diagnosis not present

## 2016-10-25 DIAGNOSIS — Z981 Arthrodesis status: Secondary | ICD-10-CM | POA: Diagnosis not present

## 2016-10-25 DIAGNOSIS — R0902 Hypoxemia: Secondary | ICD-10-CM

## 2016-10-25 DIAGNOSIS — I110 Hypertensive heart disease with heart failure: Secondary | ICD-10-CM | POA: Diagnosis not present

## 2016-10-25 DIAGNOSIS — I10 Essential (primary) hypertension: Secondary | ICD-10-CM | POA: Diagnosis not present

## 2016-10-25 DIAGNOSIS — Z801 Family history of malignant neoplasm of trachea, bronchus and lung: Secondary | ICD-10-CM | POA: Diagnosis not present

## 2016-10-25 DIAGNOSIS — M5137 Other intervertebral disc degeneration, lumbosacral region: Secondary | ICD-10-CM | POA: Diagnosis not present

## 2016-10-25 DIAGNOSIS — Z6841 Body Mass Index (BMI) 40.0 and over, adult: Secondary | ICD-10-CM | POA: Diagnosis not present

## 2016-10-25 DIAGNOSIS — I482 Chronic atrial fibrillation: Secondary | ICD-10-CM | POA: Diagnosis not present

## 2016-10-25 DIAGNOSIS — Z833 Family history of diabetes mellitus: Secondary | ICD-10-CM | POA: Diagnosis not present

## 2016-10-25 DIAGNOSIS — R062 Wheezing: Secondary | ICD-10-CM | POA: Diagnosis not present

## 2016-10-25 DIAGNOSIS — I480 Paroxysmal atrial fibrillation: Secondary | ICD-10-CM | POA: Diagnosis not present

## 2016-10-25 DIAGNOSIS — Z8249 Family history of ischemic heart disease and other diseases of the circulatory system: Secondary | ICD-10-CM | POA: Diagnosis not present

## 2016-10-25 DIAGNOSIS — Z90710 Acquired absence of both cervix and uterus: Secondary | ICD-10-CM | POA: Diagnosis not present

## 2016-10-25 DIAGNOSIS — Z87442 Personal history of urinary calculi: Secondary | ICD-10-CM | POA: Diagnosis not present

## 2016-10-25 DIAGNOSIS — I5033 Acute on chronic diastolic (congestive) heart failure: Secondary | ICD-10-CM | POA: Diagnosis not present

## 2016-10-25 DIAGNOSIS — I440 Atrioventricular block, first degree: Secondary | ICD-10-CM | POA: Diagnosis not present

## 2016-10-25 DIAGNOSIS — I509 Heart failure, unspecified: Secondary | ICD-10-CM

## 2016-10-25 DIAGNOSIS — N200 Calculus of kidney: Secondary | ICD-10-CM | POA: Diagnosis not present

## 2016-10-25 DIAGNOSIS — J9601 Acute respiratory failure with hypoxia: Secondary | ICD-10-CM | POA: Diagnosis not present

## 2016-10-25 LAB — ECHOCARDIOGRAM COMPLETE
AVLVOTPG: 6 mmHg
Area-P 1/2: 1.76 cm2
E/e' ratio: 7.06
EWDT: 462 ms
FS: 32 % (ref 28–44)
Height: 62 in
IVS/LV PW RATIO, ED: 1.09
LA ID, A-P, ES: 44 mm
LA diam index: 1.99 cm/m2
LA vol index: 16.8 mL/m2
LA vol: 37.1 mL
LAVOLA4C: 38.2 mL
LEFT ATRIUM END SYS DIAM: 44 mm
LV E/e' medial: 7.06
LV E/e'average: 7.06
LV PW d: 12.6 mm — AB (ref 0.6–1.1)
LV TDI E'LATERAL: 7.51
LV dias vol index: 23 mL/m2
LV e' LATERAL: 7.51 cm/s
LV sys vol index: 8 mL/m2
LV sys vol: 18 mL (ref 14–42)
LVDIAVOL: 50 mL (ref 46–106)
LVOT SV: 65 mL
LVOT VTI: 22.9 cm
LVOT area: 2.84 cm2
LVOT peak vel: 119 cm/s
LVOTD: 19 mm
MV Dec: 462
MV pk E vel: 53 m/s
MVPKAVEL: 85.9 m/s
MVSPHT: 136 ms
RV LATERAL S' VELOCITY: 14 cm/s
RV TAPSE: 13 mm
Simpson's disk: 64
Stroke v: 32 ml
TDI e' medial: 8.05
Weight: 3739.2 oz

## 2016-10-25 LAB — URINALYSIS, ROUTINE W REFLEX MICROSCOPIC
Bacteria, UA: NONE SEEN
Bilirubin Urine: NEGATIVE
Glucose, UA: NEGATIVE mg/dL
Ketones, ur: NEGATIVE mg/dL
Leukocytes, UA: NEGATIVE
Nitrite: NEGATIVE
Protein, ur: NEGATIVE mg/dL
Specific Gravity, Urine: 1.006 (ref 1.005–1.030)
pH: 5 (ref 5.0–8.0)

## 2016-10-25 LAB — BASIC METABOLIC PANEL
ANION GAP: 12 (ref 5–15)
BUN: 17 mg/dL (ref 6–20)
CHLORIDE: 102 mmol/L (ref 101–111)
CO2: 26 mmol/L (ref 22–32)
Calcium: 9.4 mg/dL (ref 8.9–10.3)
Creatinine, Ser: 0.72 mg/dL (ref 0.44–1.00)
GFR calc non Af Amer: >60 mL/min (ref >60)
Glucose, Bld: 221 mg/dL — ABNORMAL HIGH (ref 65–99)
POTASSIUM: 3.9 mmol/L (ref 3.5–5.1)
Sodium: 140 mmol/L (ref 135–145)

## 2016-10-25 LAB — LACTIC ACID, PLASMA
Lactic Acid, Venous: 1 mmol/L (ref 0.5–1.9)
Lactic Acid, Venous: 1.9 mmol/L (ref 0.5–1.9)

## 2016-10-25 MED ORDER — POTASSIUM CHLORIDE 20 MEQ PO PACK
40.0000 meq | PACK | Freq: Once | ORAL | Status: AC
  Administered 2016-10-25: 40 meq via ORAL
  Filled 2016-10-25: qty 2

## 2016-10-25 MED ORDER — GUAIFENESIN-DM 100-10 MG/5ML PO SYRP
5.0000 mL | ORAL_SOLUTION | ORAL | Status: DC | PRN
  Administered 2016-10-25: 5 mL via ORAL
  Filled 2016-10-25: qty 5

## 2016-10-25 NOTE — Progress Notes (Signed)
Lamar Blinks NP ordered urinalysis for this pt.

## 2016-10-25 NOTE — Progress Notes (Signed)
PROGRESS NOTE    [name]  QXI:[id number] DOB: 1946/02/24 DOA: 10/24/2016 PCP: Jani Gravel, MD   Chief Complaint  Patient presents with  . Shortness of Breath  . Bronchitis  . Atrial Fibrillation     Brief Narrative:  [name] is a 71 y.o. female with medical history significant for chronic atrial fibrillation on eliquis, chronic diastolic heart failure, obesity and hypertension. Patient was recently discharged on 3/20 after an admission for bronchitis. She was discharged on a prednisone taper. Since discharge patient has had progressive shortness of breath, increase in weight and subsequently has developed significant dyspnea on exertion and orthopnea that has worsened over the past 48 hours. In review of the weight graph, in mid-March her weight was 230 lbs and current weight is 249 lbs. She has had a nonproductive, dry cough. She has not had any fevers or chills. Assessment & Plan   Acute respiratory failure with hypoxia  -Patient presents with progressive dyspnea on exertion and orthopnea with associated hypoxemia and weight gain consistent with likely heart failure exacerbation -Continue supportive care (O2)-wean oxygen as tolerated -CT chest: No active cardiopulmonary disease.  Elevated lactic acid level -Likely secondary to hypoperfusion in setting of hypoxemic respiratory failure and heart failure -Lactic acid improved from 5.2 to 1.9 -No fever, leukocytosis or other signs of infection  Acute on chronic diastolic heart failure  -Presented with acute hypoxemic respiratory failure with wheezing and increased weight consistent with heart failure exacerbation -Weight mid March 2018 230 pounds with current weight 249 pounds -Patient reported yesterday took one of her 20 mg Lasix tablets and noted increased urinary frequency ie q 2 hrs UOP -Last echocardiogram 2015: Preserved LV function w/ grade 2 diastolic dysfunction -Pending echocardiogram -Additional differential  includes viral cardiomyopathy given recent admission for viral bronchitis -No ARB, ACE inhibitor or beta blocker at this juncture-of note patient has first-degree AV block -Daily weights and strict I/O -Continue IV lasix 40mg  BID  Essential hypertension -Continue preadmission CCB- cardizem -Continue lasix  Chronic atrial fibrillation  -Currently maintaining sinus rhythm  -Continue CCB for rate control-if echo demonstrates systolic dysfunction need to consider changing to BB -Continue eliquis -CHADVASc = 4  Obesity -Weight reduction strategies per primary care physician  Renal stone -CT renal study: 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 No obstructive uropathy  -Continue to monitor closely, likely needs follow up with urology at discharge  DVT Prophylaxis  Eliquis  Code Status: Full  Family Communication: Husband at bedside  Disposition Plan: Currently in observation. Pending improvement in respiratory status and echocardiogram. Continue to diurese.   Consultants None  Procedures  None  Antibiotics   Anti-infectives    Start     Dose/Rate Route Frequency Ordered Stop   10/24/16 1930  ceFEPIme (MAXIPIME) 1 g in dextrose 5 % 50 mL IVPB     1 g 100 mL/hr over 30 Minutes Intravenous Every 8 hours 10/24/16 1841     10/24/16 1915  ceFEPIme (MAXIPIME) 1 g in dextrose 5 % 50 mL IVPB  Status:  Discontinued     1 g 100 mL/hr over 30 Minutes Intravenous  Once 10/24/16 1817 10/24/16 1840      Subjective:   [name] seen and examined today. Patient feels breathing has improved, but not back to her baseline. Feels wheezing has improved. Not able to speak without breaking. Denies chest  pain, abdominal pain, N/V/D/C.   Objective:   Vitals:   10/24/16 1841 10/24/16 2053 10/25/16 0012 10/25/16 0610  BP: 139/76 (!) 146/99 (!) 149/78 (!) 163/88  Pulse:  (!) 110 100 91 (!) 105  Resp: (!) 24 (!) 24 20 20   Temp: 98.8 F (37.1 C) 98.5 F (36.9 C) 98 F (36.7 C) 98.3 F (36.8 C)  TempSrc:  Oral Oral Oral  SpO2: 98% 98% 96% 95%  Weight: 107.9 kg (237 lb 14.4 oz)   106 kg (233 lb 11.2 oz)  Height: 5\' 2"  (1.575 m)       Intake/Output Summary (Last 24 hours) at 10/25/16 1146 Last data filed at 10/25/16 0617  Gross per 24 hour  Intake              360 ml  Output             3000 ml  Net            -2640 ml   Filed Weights   10/24/16 1250 10/24/16 1841 10/25/16 0610  Weight: 112.9 kg (249 lb) 107.9 kg (237 lb 14.4 oz) 106 kg (233 lb 11.2 oz)    Exam  General: Well developed, well nourished, NAD, appears stated age  HEENT: NCAT, mucous membranes moist.   Neck: Supple  Cardiovascular: S1 S2 auscultated, RRR, no murmurs  Respiratory: Mild expiratory wheezing, mild increased WOB  Abdomen: Soft, nontender, nondistended, + bowel sounds  Extremities: warm dry without cyanosis clubbing, 1+LE edema.  Neuro: AAOx3, nonfocal  Psych: Normal affect and demeanor   Data Reviewed: I have personally reviewed following labs and imaging studies  CBC:  Recent Labs Lab 10/24/16 1248  WBC 9.7  HGB 13.0  HCT 39.5  MCV 89.8  PLT 716   Basic Metabolic Panel:  Recent Labs Lab 10/24/16 1248 10/25/16 0558  NA 140 140  K 3.5 3.9  CL 104 102  CO2 26 26  GLUCOSE 152* 221*  BUN 13 17  CREATININE 0.77 0.72  CALCIUM 9.2 9.4  MG 2.0  --    GFR: Estimated Creatinine Clearance: 72.8 mL/min (by C-G formula based on SCr of 0.72 mg/dL). Liver Function Tests: No results for input(s): AST, ALT, ALKPHOS, BILITOT, PROT, ALBUMIN in the last 168 hours. No results for input(s): LIPASE, AMYLASE in the last 168 hours. No results for input(s): AMMONIA in the last 168 hours. Coagulation Profile: No results for input(s): INR, PROTIME in the last 168 hours. Cardiac Enzymes: No results for input(s): CKTOTAL, CKMB, CKMBINDEX, TROPONINI in the last  168 hours. BNP (last 3 results) No results for input(s): PROBNP in the last 8760 hours. HbA1C: No results for input(s): HGBA1C in the last 72 hours. CBG:  Recent Labs Lab 10/24/16 2052  GLUCAP 318*   Lipid Profile: No results for input(s): CHOL, HDL, LDLCALC, TRIG, CHOLHDL, LDLDIRECT in the last 72 hours. Thyroid Function Tests: No results for input(s): TSH, T4TOTAL, FREET4, T3FREE, THYROIDAB in the last 72 hours. Anemia Panel: No results for input(s): VITAMINB12, FOLATE, FERRITIN, TIBC, IRON, RETICCTPCT in the last 72 hours. Urine analysis:    Component Value Date/Time   COLORURINE YELLOW 07/29/2012 1128   APPEARANCEUR CLEAR 07/29/2012 1128   LABSPEC 1.022 07/29/2012 1128   PHURINE 5.5 07/29/2012 1128   GLUCOSEU NEGATIVE 07/29/2012 1128   HGBUR SMALL (A) 07/29/2012 1128   BILIRUBINUR NEGATIVE 07/29/2012 1128   KETONESUR NEGATIVE 07/29/2012 1128   PROTEINUR NEGATIVE 07/29/2012 1128   UROBILINOGEN 0.2 07/29/2012 1128   NITRITE  NEGATIVE 07/29/2012 1128   LEUKOCYTESUR SMALL (A) 07/29/2012 1128   Sepsis Labs: @LABRCNTIP (procalcitonin:4,lacticidven:4)  )No results found for this or any previous visit (from the past 240 hour(s)).    Radiology Studies: Dg Chest 2 View  Result Date: 10/24/2016 CLINICAL DATA:  71 year old female with progressive shortness of breath and wheezing today. Initial encounter. EXAM: CHEST  2 VIEW COMPARISON:  09/28/2016 and earlier. FINDINGS: Upright AP and lateral views of the chest. Stable mild cardiomegaly and mediastinal contours. No pneumothorax, pulmonary edema, pleural effusion or confluent pulmonary opacity. Cervical ACDF hardware again noted. No acute osseous abnormality identified. Negative visible bowel gas pattern. IMPRESSION: No acute cardiopulmonary abnormality. Electronically Signed   By: Genevie Ann M.D.   On: 10/24/2016 14:10   Ct Chest Wo Contrast  Result Date: 10/24/2016 CLINICAL DATA:  Acute hypoxemic respiratory failure with cardiac  wheeze. Right-sided pain in the abdomen. EXAM: CT CHEST, ABDOMEN AND PELVIS WITHOUT CONTRAST TECHNIQUE: Multidetector CT imaging of the chest, abdomen and pelvis was performed following the standard protocol without IV contrast. COMPARISON:  None. FINDINGS: CT CHEST FINDINGS Cardiovascular: No significant vascular findings. Normal heart size. No pericardial effusion. Mediastinum/Nodes: No enlarged mediastinal, hilar, or axillary lymph nodes. Thyroid gland, trachea, and esophagus demonstrate no significant findings. Lungs/Pleura: Lungs are clear. Minimal subsegmental atelectasis in the lingula. No pleural effusion or pneumothorax. Musculoskeletal: ACDF of the visualized lower cervical spine. Striated appearance of the vertebral bodies from T1 through T3 may reflect vertebral body hemangiomas. No acute osseous abnormality. CT ABDOMEN PELVIS FINDINGS Hepatobiliary: Homogeneous appearance of the liver without biliary dilatation. The gallbladder is not well-visualized and could be surgically absent. Pancreas: Unremarkable. No pancreatic ductal dilatation or surrounding inflammatory changes. Spleen: No splenomegaly or mass. Adrenals/Urinary Tract: Large staghorn calculus within the left renal collecting system. A nonobstructing 11 x 8 mm calculus is seen in the mid to lower pole of the right kidney with smaller calculi also seen in the upper and lower pole. No hydroureter is noted. The bladder is unremarkable. No bladder calcifications are seen. Stomach/Bowel: Appendectomy. No bowel obstruction or inflammation. Scattered colonic diverticulosis along the sigmoid colon without acute diverticulitis. Normal small bowel rotation. Physiologic distention of the stomach. Vascular/Lymphatic: Aortic atherosclerosis. No enlarged abdominal or pelvic lymph nodes.Bold Reproductive: Status post hysterectomy. No adnexal masses. Other: No abdominal wall hernia or abnormality. No abdominopelvic ascites. Musculoskeletal: Degenerative disc  disease L1 through S1 with discogenic sclerosis of the endplates and mild associated facet arthropathy. No acute osseous abnormality. IMPRESSION: 1. No active cardiopulmonary disease. 2. 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 No obstructive uropathy is identified. 3. Status post hysterectomy and appendectomy. 4. Sigmoid diverticulosis without acute diverticulitis. 5. ACDF of the visualized lower cervical spine. T1 through T3 vertebral body hemangiomas are suggested given their striated appearance. Lumbar spondylosis. Electronically Signed   By: Ashley Royalty M.D.   On: 10/24/2016 19:02   Ct Renal Stone Study  Result Date: 10/24/2016 CLINICAL DATA:  Acute hypoxemic respiratory failure with cardiac wheeze. Right-sided pain in the abdomen. EXAM: CT CHEST, ABDOMEN AND PELVIS WITHOUT CONTRAST TECHNIQUE: Multidetector CT imaging of the chest, abdomen and pelvis was performed following the standard protocol without IV contrast. COMPARISON:  None. FINDINGS: CT CHEST FINDINGS Cardiovascular: No significant vascular findings. Normal heart size. No pericardial effusion. Mediastinum/Nodes: No enlarged mediastinal, hilar, or axillary lymph nodes. Thyroid gland, trachea, and esophagus demonstrate no significant findings.  Lungs/Pleura: Lungs are clear. Minimal subsegmental atelectasis in the lingula. No pleural effusion or pneumothorax. Musculoskeletal: ACDF of the visualized lower cervical spine. Striated appearance of the vertebral bodies from T1 through T3 may reflect vertebral body hemangiomas. No acute osseous abnormality. CT ABDOMEN PELVIS FINDINGS Hepatobiliary: Homogeneous appearance of the liver without biliary dilatation. The gallbladder is not well-visualized and could be surgically absent. Pancreas: Unremarkable. No pancreatic ductal dilatation or surrounding inflammatory changes. Spleen: No  splenomegaly or mass. Adrenals/Urinary Tract: Large staghorn calculus within the left renal collecting system. A nonobstructing 11 x 8 mm calculus is seen in the mid to lower pole of the right kidney with smaller calculi also seen in the upper and lower pole. No hydroureter is noted. The bladder is unremarkable. No bladder calcifications are seen. Stomach/Bowel: Appendectomy. No bowel obstruction or inflammation. Scattered colonic diverticulosis along the sigmoid colon without acute diverticulitis. Normal small bowel rotation. Physiologic distention of the stomach. Vascular/Lymphatic: Aortic atherosclerosis. No enlarged abdominal or pelvic lymph nodes.Bold Reproductive: Status post hysterectomy. No adnexal masses. Other: No abdominal wall hernia or abnormality. No abdominopelvic ascites. Musculoskeletal: Degenerative disc disease L1 through S1 with discogenic sclerosis of the endplates and mild associated facet arthropathy. No acute osseous abnormality. IMPRESSION: 1. No active cardiopulmonary disease. 2. 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 No obstructive uropathy is identified. 3. Status post hysterectomy and appendectomy. 4. Sigmoid diverticulosis without acute diverticulitis. 5. ACDF of the visualized lower cervical spine. T1 through T3 vertebral body hemangiomas are suggested given their striated appearance. Lumbar spondylosis. Electronically Signed   By: Ashley Royalty M.D.   On: 10/24/2016 19:02     Scheduled Meds: . apixaban  5 mg Oral BID  . ceFEPime (MAXIPIME) IV  1 g Intravenous Q8H  . diclofenac sodium  2 g Topical QID  . diltiazem  120 mg Oral Daily  . furosemide  40 mg Intravenous Q12H  . mouth rinse  15 mL Mouth Rinse BID  . nitroGLYCERIN  1 inch Topical Q6H  . sodium chloride flush  3 mL Intravenous Q12H   Continuous Infusions:   LOS: 0 days   Time Spent in minutes    30 minutes  Cason Dabney D.O. on 10/25/2016 at 11:46 AM  Between 7am to 7pm - Pager - [phone]  After 7pm go to [email or web address] - password [email or web address]  And look for the night coverage person covering for me after hours  [facility] Office  [phone]

## 2016-10-25 NOTE — Progress Notes (Signed)
*  PRELIMINARY RESULTS* Echocardiogram 2D Echocardiogram has been performed.  [name] 10/25/2016, 3:14 PM

## 2016-10-25 NOTE — Progress Notes (Signed)
Pt's urine is very dark with sediments, K. Schorr NP notified, awaiting call back at this time. Will continue to monitor.

## 2016-10-26 ENCOUNTER — Telehealth: Payer: Self-pay | Admitting: Interventional Cardiology

## 2016-10-26 ENCOUNTER — Inpatient Hospital Stay (HOSPITAL_COMMUNITY): Payer: PPO

## 2016-10-26 DIAGNOSIS — J9601 Acute respiratory failure with hypoxia: Secondary | ICD-10-CM

## 2016-10-26 LAB — CBC
HCT: 39.8 % (ref 36.0–46.0)
HEMOGLOBIN: 13 g/dL (ref 12.0–15.0)
MCH: 29.1 pg (ref 26.0–34.0)
MCHC: 32.7 g/dL (ref 30.0–36.0)
MCV: 89.2 fL (ref 78.0–100.0)
Platelets: 340 10*3/uL (ref 150–400)
RBC: 4.46 MIL/uL (ref 3.87–5.11)
RDW: 13.3 % (ref 11.5–15.5)
WBC: 13.2 10*3/uL — ABNORMAL HIGH (ref 4.0–10.5)

## 2016-10-26 LAB — BASIC METABOLIC PANEL
ANION GAP: 15 (ref 5–15)
BUN: 25 mg/dL — ABNORMAL HIGH (ref 6–20)
CALCIUM: 9.1 mg/dL (ref 8.9–10.3)
CO2: 26 mmol/L (ref 22–32)
Chloride: 98 mmol/L — ABNORMAL LOW (ref 101–111)
Creatinine, Ser: 0.94 mg/dL (ref 0.44–1.00)
GFR calc Af Amer: >60 mL/min (ref >60)
GFR calc non Af Amer: 59 mL/min — ABNORMAL LOW (ref >60)
GLUCOSE: 150 mg/dL — AB (ref 65–99)
POTASSIUM: 3.4 mmol/L — AB (ref 3.5–5.1)
Sodium: 139 mmol/L (ref 135–145)

## 2016-10-26 MED ORDER — TAMSULOSIN HCL 0.4 MG PO CAPS
0.4000 mg | ORAL_CAPSULE | Freq: Every day | ORAL | Status: DC
  Administered 2016-10-26 – 2016-10-27 (×2): 0.4 mg via ORAL
  Filled 2016-10-26 (×2): qty 1

## 2016-10-26 MED ORDER — FUROSEMIDE 20 MG PO TABS
20.0000 mg | ORAL_TABLET | Freq: Two times a day (BID) | ORAL | Status: DC
  Administered 2016-10-26 – 2016-10-27 (×3): 20 mg via ORAL
  Filled 2016-10-26 (×3): qty 1

## 2016-10-26 MED ORDER — POTASSIUM CHLORIDE CRYS ER 20 MEQ PO TBCR
40.0000 meq | EXTENDED_RELEASE_TABLET | Freq: Once | ORAL | Status: AC
  Administered 2016-10-26: 40 meq via ORAL
  Filled 2016-10-26: qty 2

## 2016-10-26 MED ORDER — HYDROCODONE-ACETAMINOPHEN 5-325 MG PO TABS
1.0000 | ORAL_TABLET | Freq: Four times a day (QID) | ORAL | Status: DC | PRN
  Administered 2016-10-26 – 2016-10-27 (×3): 1 via ORAL
  Filled 2016-10-26 (×3): qty 1

## 2016-10-26 NOTE — Consult Note (Signed)
   [facility] Inpatient Consult   10/26/2016  [name] July 06, 1946 [id number]   Patient is in the [place].  Patient states she was having some difficulty with breathing and they are trying to rule out Heart Failure.  She states she does not feel she needs home visits or follow up at this time.  A brochure with contact information was given and encouraged to call.  Patient declines needs at this time.  Of note, [facility] Care Management services does not replace or interfere with any services that are needed or arranged by inpatient case management or social work.  For additional questions or referrals please contact:  Natividad Brood, RN BSN [facility] Liaison  [phone] business mobile phone Toll free office [phone]

## 2016-10-26 NOTE — Progress Notes (Signed)
CM following for DCP; Patient is active with [place] as prior to admission for HHPT; Aneta Mins [phone]

## 2016-10-26 NOTE — Telephone Encounter (Signed)
I have reviewed and believe they are working on figuring things out.

## 2016-10-26 NOTE — Progress Notes (Signed)
PROGRESS NOTE    [name]  WVP:[id number] DOB: 1946-01-07 DOA: 10/24/2016 PCP: Jani Gravel, MD   Chief Complaint  Patient presents with  . Shortness of Breath  . Bronchitis  . Atrial Fibrillation     Brief Narrative:  [name] is a 71 y.o. female with medical history significant for chronic atrial fibrillation on eliquis, chronic diastolic heart failure, obesity and hypertension. Patient was recently discharged on 3/20 after an admission for bronchitis. She was discharged on a prednisone taper. Since discharge patient has had progressive shortness of breath, increase in weight and subsequently has developed significant dyspnea on exertion and orthopnea that has worsened over the past 48 hours. In review of the weight graph, in mid-March her weight was 230 lbs and current weight is 249 lbs. She has had a nonproductive, dry cough. She has not had any fevers or chills. Assessment & Plan   Acute respiratory failure with hypoxia  -Patient presents with progressive dyspnea on exertion and orthopnea with associated hypoxemia and weight gain consistent with likely heart failure exacerbation -Continue supportive care (O2)-wean oxygen as tolerated -CT chest: No active cardiopulmonary disease.Changing Lasix to by mouth.  Elevated lactic acid level -Likely secondary to hypoperfusion in setting of hypoxemic respiratory failure and heart failure -Lactic acid improved from 5.2 to 1.9 -No fever, leukocytosis or other signs of infection  Acute on chronic diastolic heart failure  -Presented with acute hypoxemic respiratory failure with wheezing and increased weight consistent with heart failure exacerbation -Weight mid March 2018 230 pounds with current weight 249 pounds -Patient reported yesterday took one of her 20 mg Lasix tablets and noted increased urinary frequency ie q 2 hrs UOP -Last echocardiogram 2015: Preserved LV function w/ grade 2 diastolic dysfunction -echocardiogram shows  preserved EF with diastolic dysfunction -Daily weights and strict I/O Changing Lasix to by mouth.  Essential hypertension -Continue preadmission CCB- cardizem -Continue lasix  Chronic atrial fibrillation  -Currently maintaining sinus rhythm  -Continue CCB for rate control-if echo demonstrates systolic dysfunction need to consider changing to BB -Continue eliquis -CHADVASc = 4  Obesity -Weight reduction strategies per primary care physician  Renal stone Right flank pain. 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 No obstructive uropathy  Started having right flank pain today. Will add Norco and Flomax to the regimen. Discussed with Dr. Jeffie Pollock with urology, recommended KUB, which is unremarkable for any ureteric or bladder stone. Also ultrasound renal which is currently pending. If any evidence of obstruction patient will need to be seen by urology in the hospital otherwise patient has outpatient follow-up on 10/29/2016.  DVT Prophylaxis  Eliquis  Code Status: Full  Family Communication: Husband at bedside  Disposition Plan: Follow-up on results of the ultrasound renal as well as further workup.   Consultants Phone consultation with urology  Procedures  None  Antibiotics   Anti-infectives    Start     Dose/Rate Route Frequency Ordered Stop   10/24/16 1930  ceFEPIme (MAXIPIME) 1 g in dextrose 5 % 50 mL IVPB  Status:  Discontinued     1 g 100 mL/hr over 30 Minutes Intravenous Every 8 hours 10/24/16 1841 10/26/16 0801   10/24/16 1915  ceFEPIme (MAXIPIME) 1 g in dextrose 5 % 50 mL IVPB  Status:  Discontinued     1 g 100 mL/hr over 30 Minutes Intravenous  Once 10/24/16  1817 10/24/16 1840      Subjective:   [name] seen and examined today. Breathing is better, complains about severe right-sided pain. No nausea no vomiting. No diarrhea.     Objective:   Vitals:   10/25/16 1955 10/26/16 0019 10/26/16 0537 10/26/16 1314  BP: (!) 126/54 128/64 122/66 130/75  Pulse: 87 82 83 94  Resp: 18 18 18 18   Temp: 97.6 F (36.4 C) 97.9 F (36.6 C) 98 F (36.7 C) 97.7 F (36.5 C)  TempSrc: Oral Oral Oral Oral  SpO2: 96% 96% 96% 97%  Weight:   105.9 kg (233 lb 7.5 oz)   Height:        Intake/Output Summary (Last 24 hours) at 10/26/16 1602 Last data filed at 10/26/16 1314  Gross per 24 hour  Intake              890 ml  Output             2100 ml  Net            -1210 ml   Filed Weights   10/24/16 1841 10/25/16 0610 10/26/16 0537  Weight: 107.9 kg (237 lb 14.4 oz) 106 kg (233 lb 11.2 oz) 105.9 kg (233 lb 7.5 oz)    Exam  General: Well developed, well nourished, NAD, appears stated age  HEENT: NCAT, mucous membranes moist.   Neck: Supple  Cardiovascular: S1 S2 auscultated, RRR, no murmurs  Respiratory: Mild expiratory wheezing, mild increased WOB  Abdomen: Soft, nontender, nondistended, + bowel sounds  Extremities: warm dry without cyanosis clubbing, 1+LE edema.  Neuro: AAOx3, nonfocal  Psych: Normal affect and demeanor   Data Reviewed: I have personally reviewed following labs and imaging studies  CBC:  Recent Labs Lab 10/24/16 1248 10/26/16 0258  WBC 9.7 13.2*  HGB 13.0 13.0  HCT 39.5 39.8  MCV 89.8 89.2  PLT 328 295   Basic Metabolic Panel:  Recent Labs Lab 10/24/16 1248 10/25/16 0558 10/26/16 0258  NA 140 140 139  K 3.5 3.9 3.4*  CL 104 102 98*  CO2 26 26 26   GLUCOSE 152* 221* 150*  BUN 13 17 25*  CREATININE 0.77 0.72 0.94  CALCIUM 9.2 9.4 9.1  MG 2.0  --   --    GFR: Estimated Creatinine Clearance: 61.8 mL/min (by C-G formula based on SCr of 0.94 mg/dL). Liver Function Tests: No results for input(s): AST, ALT, ALKPHOS, BILITOT, PROT, ALBUMIN in the last 168 hours. No results for input(s): LIPASE, AMYLASE in the last 168 hours. No results for input(s): AMMONIA in the last 168  hours. Coagulation Profile: No results for input(s): INR, PROTIME in the last 168 hours. Cardiac Enzymes: No results for input(s): CKTOTAL, CKMB, CKMBINDEX, TROPONINI in the last 168 hours. BNP (last 3 results) No results for input(s): PROBNP in the last 8760 hours. HbA1C: No results for input(s): HGBA1C in the last 72 hours. CBG:  Recent Labs Lab 10/24/16 2052  GLUCAP 318*   Lipid Profile: No results for input(s): CHOL, HDL, LDLCALC, TRIG, CHOLHDL, LDLDIRECT in the last 72 hours. Thyroid Function Tests: No results for input(s): TSH, T4TOTAL, FREET4, T3FREE, THYROIDAB in the last 72 hours. Anemia Panel: No results for input(s): VITAMINB12, FOLATE, FERRITIN, TIBC, IRON, RETICCTPCT in the last 72 hours. Urine analysis:    Component Value Date/Time   COLORURINE STRAW (A) 10/25/2016 2248   APPEARANCEUR CLEAR 10/25/2016 2248   LABSPEC 1.006 10/25/2016 2248   PHURINE 5.0 10/25/2016 2248   GLUCOSEU  NEGATIVE 10/25/2016 2248   HGBUR LARGE (A) 10/25/2016 2248   BILIRUBINUR NEGATIVE 10/25/2016 2248   KETONESUR NEGATIVE 10/25/2016 2248   PROTEINUR NEGATIVE 10/25/2016 2248   UROBILINOGEN 0.2 07/29/2012 1128   NITRITE NEGATIVE 10/25/2016 2248   LEUKOCYTESUR NEGATIVE 10/25/2016 2248   Sepsis Labs: @LABRCNTIP (procalcitonin:4,lacticidven:4)  )No results found for this or any previous visit (from the past 240 hour(s)).    Radiology Studies: Ct Chest Wo Contrast  Result Date: 10/24/2016 CLINICAL DATA:  Acute hypoxemic respiratory failure with cardiac wheeze. Right-sided pain in the abdomen. EXAM: CT CHEST, ABDOMEN AND PELVIS WITHOUT CONTRAST TECHNIQUE: Multidetector CT imaging of the chest, abdomen and pelvis was performed following the standard protocol without IV contrast. COMPARISON:  None. FINDINGS: CT CHEST FINDINGS Cardiovascular: No significant vascular findings. Normal heart size. No pericardial effusion. Mediastinum/Nodes: No enlarged mediastinal, hilar, or axillary lymph nodes.  Thyroid gland, trachea, and esophagus demonstrate no significant findings. Lungs/Pleura: Lungs are clear. Minimal subsegmental atelectasis in the lingula. No pleural effusion or pneumothorax. Musculoskeletal: ACDF of the visualized lower cervical spine. Striated appearance of the vertebral bodies from T1 through T3 may reflect vertebral body hemangiomas. No acute osseous abnormality. CT ABDOMEN PELVIS FINDINGS Hepatobiliary: Homogeneous appearance of the liver without biliary dilatation. The gallbladder is not well-visualized and could be surgically absent. Pancreas: Unremarkable. No pancreatic ductal dilatation or surrounding inflammatory changes. Spleen: No splenomegaly or mass. Adrenals/Urinary Tract: Large staghorn calculus within the left renal collecting system. A nonobstructing 11 x 8 mm calculus is seen in the mid to lower pole of the right kidney with smaller calculi also seen in the upper and lower pole. No hydroureter is noted. The bladder is unremarkable. No bladder calcifications are seen. Stomach/Bowel: Appendectomy. No bowel obstruction or inflammation. Scattered colonic diverticulosis along the sigmoid colon without acute diverticulitis. Normal small bowel rotation. Physiologic distention of the stomach. Vascular/Lymphatic: Aortic atherosclerosis. No enlarged abdominal or pelvic lymph nodes.Bold Reproductive: Status post hysterectomy. No adnexal masses. Other: No abdominal wall hernia or abnormality. No abdominopelvic ascites. Musculoskeletal: Degenerative disc disease L1 through S1 with discogenic sclerosis of the endplates and mild associated facet arthropathy. No acute osseous abnormality. IMPRESSION: 1. No active cardiopulmonary disease. 2. 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 No obstructive uropathy is identified. 3. Status post hysterectomy and appendectomy. 4. Sigmoid  diverticulosis without acute diverticulitis. 5. ACDF of the visualized lower cervical spine. T1 through T3 vertebral body hemangiomas are suggested given their striated appearance. Lumbar spondylosis. Electronically Signed   By: Ashley Royalty M.D.   On: 10/24/2016 19:02   Dg Abd 2 Views  Result Date: 10/26/2016 CLINICAL DATA:  History of kidney stones. Right flank pain since Saturday. EXAM: ABDOMEN - 2 VIEW COMPARISON:  Bilateral kidney stones unchanged since the CT scan of 2 days ago. No definite ureteral stone. FINDINGS: There is a coarse staghorn calculus print affecting over the midpole of the left kidney. On the light at an approximately 12 x 15 mm stone projects over a mid pole calyx with 2 smaller stones projecting over lower pole calices. No definite ureteral or bladder stones. There tiny phleboliths within the pelvis. The bowel gas pattern is unremarkable. There is moderate multilevel degenerative disc disease of the mid and lower lumbar spine. IMPRESSION: Stable bilateral kidney stones including a left-sided staghorn calculus. No ureteral or bladder stones are observed. Electronically Signed   By: David  Martinique M.D.  On: 10/26/2016 11:06   Ct Renal Stone Study  Result Date: 10/24/2016 CLINICAL DATA:  Acute hypoxemic respiratory failure with cardiac wheeze. Right-sided pain in the abdomen. EXAM: CT CHEST, ABDOMEN AND PELVIS WITHOUT CONTRAST TECHNIQUE: Multidetector CT imaging of the chest, abdomen and pelvis was performed following the standard protocol without IV contrast. COMPARISON:  None. FINDINGS: CT CHEST FINDINGS Cardiovascular: No significant vascular findings. Normal heart size. No pericardial effusion. Mediastinum/Nodes: No enlarged mediastinal, hilar, or axillary lymph nodes. Thyroid gland, trachea, and esophagus demonstrate no significant findings. Lungs/Pleura: Lungs are clear. Minimal subsegmental atelectasis in the lingula. No pleural effusion or pneumothorax. Musculoskeletal: ACDF of  the visualized lower cervical spine. Striated appearance of the vertebral bodies from T1 through T3 may reflect vertebral body hemangiomas. No acute osseous abnormality. CT ABDOMEN PELVIS FINDINGS Hepatobiliary: Homogeneous appearance of the liver without biliary dilatation. The gallbladder is not well-visualized and could be surgically absent. Pancreas: Unremarkable. No pancreatic ductal dilatation or surrounding inflammatory changes. Spleen: No splenomegaly or mass. Adrenals/Urinary Tract: Large staghorn calculus within the left renal collecting system. A nonobstructing 11 x 8 mm calculus is seen in the mid to lower pole of the right kidney with smaller calculi also seen in the upper and lower pole. No hydroureter is noted. The bladder is unremarkable. No bladder calcifications are seen. Stomach/Bowel: Appendectomy. No bowel obstruction or inflammation. Scattered colonic diverticulosis along the sigmoid colon without acute diverticulitis. Normal small bowel rotation. Physiologic distention of the stomach. Vascular/Lymphatic: Aortic atherosclerosis. No enlarged abdominal or pelvic lymph nodes.Bold Reproductive: Status post hysterectomy. No adnexal masses. Other: No abdominal wall hernia or abnormality. No abdominopelvic ascites. Musculoskeletal: Degenerative disc disease L1 through S1 with discogenic sclerosis of the endplates and mild associated facet arthropathy. No acute osseous abnormality. IMPRESSION: 1. No active cardiopulmonary disease. 2. 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 No obstructive uropathy is identified. 3. Status post hysterectomy and appendectomy. 4. Sigmoid diverticulosis without acute diverticulitis. 5. ACDF of the visualized lower cervical spine. T1 through T3 vertebral body hemangiomas are suggested given their striated appearance. Lumbar spondylosis. Electronically Signed    By: Ashley Royalty M.D.   On: 10/24/2016 19:02     Scheduled Meds: . apixaban  5 mg Oral BID  . diclofenac sodium  2 g Topical QID  . diltiazem  120 mg Oral Daily  . furosemide  20 mg Oral BID  . mouth rinse  15 mL Mouth Rinse BID  . sodium chloride flush  3 mL Intravenous Q12H  . tamsulosin  0.4 mg Oral QPC breakfast   Continuous Infusions:   LOS: 1 day   Time Spent in minutes   30 minutes  Author:  Berle Mull, MD [facility] Pager: [phone] 10/26/2016 4:06 PM     After 7pm go to [email or web address] - password [email or web address]  And look for the night coverage person covering for me after hours  [facility] Office  [phone]

## 2016-10-26 NOTE — Discharge Instructions (Signed)

## 2016-10-26 NOTE — Telephone Encounter (Signed)
Spoke with husband, DPR on file.  He just wanted to make Dr. Tamala Julian aware that pt is currently in the hospital.  He states they said she was in HF.  Husband wanted Dr. Tamala Julian to just look over everything.  Advised I would send message to Dr. Tamala Julian and make him aware pt currently admitted.  Husband appreciative for call.

## 2016-10-26 NOTE — Progress Notes (Signed)
Nutrition Education Note  RD consulted for nutrition education per pt rewuest regarding CHF.  RD provided "Low Sodium Nutrition Therapy" handout from the Academy of [employer]. Reviewed patient's dietary recall. She reports eating out a lot- fried fish, french fries, soup, and steak. Provided examples on ways to decrease sodium intake in diet. Encouraged cooking at home rather than eating out. Referred pt to [employer] for recipes ideas. Discouraged intake of processed foods and use of salt shaker. Encouraged fresh fruits and vegetables as well as whole grain sources of carbohydrates to maximize fiber intake.   RD discussed why it is important for patient to adhere to diet recommendations, and emphasized the role of fluids, foods to avoid, and importance of weighing self daily. Teach back method used.  Expect good compliance.  Body mass index is 42.7 kg/m. Pt meets criteria for Morbid Obesity based on current BMI.  Current diet order is Heart healthy, patient is consuming approximately 100% of meals at this time. Labs and medications reviewed. No further nutrition interventions warranted at this time.  If additional nutrition issues arise, please re-consult RD.   Scarlette Ar RD, LDN, CSP Inpatient Clinical Dietitian Pager: [phone] After Hours Pager: [phone]

## 2016-10-26 NOTE — Telephone Encounter (Signed)
Mr. Kronberg calling on behalf of wife (patient). He states that she was admitted into [facility] on Saturday and that "they think it's heart problems and I would like Dr. Tamala Julian to check her out." Thanks.

## 2016-10-27 LAB — CBC
HCT: 38.1 % (ref 36.0–46.0)
Hemoglobin: 12.3 g/dL (ref 12.0–15.0)
MCH: 28.6 pg (ref 26.0–34.0)
MCHC: 32.3 g/dL (ref 30.0–36.0)
MCV: 88.6 fL (ref 78.0–100.0)
Platelets: 326 10*3/uL (ref 150–400)
RBC: 4.3 MIL/uL (ref 3.87–5.11)
RDW: 13.1 % (ref 11.5–15.5)
WBC: 10.8 10*3/uL — ABNORMAL HIGH (ref 4.0–10.5)

## 2016-10-27 LAB — BASIC METABOLIC PANEL
ANION GAP: 11 (ref 5–15)
BUN: 30 mg/dL — AB (ref 6–20)
CALCIUM: 9.1 mg/dL (ref 8.9–10.3)
CO2: 26 mmol/L (ref 22–32)
Chloride: 101 mmol/L (ref 101–111)
Creatinine, Ser: 0.76 mg/dL (ref 0.44–1.00)
GFR calc Af Amer: >60 mL/min (ref >60)
GLUCOSE: 148 mg/dL — AB (ref 65–99)
Potassium: 3.6 mmol/L (ref 3.5–5.1)
SODIUM: 138 mmol/L (ref 135–145)

## 2016-10-27 MED ORDER — TAMSULOSIN HCL 0.4 MG PO CAPS: 0.4000 mg | ORAL_CAPSULE | Freq: Every day | ORAL | 0 refills | Status: DC

## 2016-10-27 MED ORDER — FUROSEMIDE 20 MG PO TABS: 20.0000 mg | ORAL_TABLET | Freq: Two times a day (BID) | ORAL | 0 refills | Status: DC

## 2016-10-27 MED ORDER — METHOCARBAMOL 500 MG PO TABS
500.0000 mg | ORAL_TABLET | Freq: Three times a day (TID) | ORAL | Status: DC | PRN
Start: 2016-10-27 — End: 2016-10-27

## 2016-10-27 MED ORDER — HYDROCODONE-ACETAMINOPHEN 5-325 MG PO TABS: 1.0000 | ORAL_TABLET | Freq: Four times a day (QID) | ORAL | 0 refills | Status: DC | PRN

## 2016-10-27 MED ORDER — METHOCARBAMOL 500 MG PO TABS: 500.0000 mg | ORAL_TABLET | Freq: Three times a day (TID) | ORAL | 0 refills | Status: DC | PRN

## 2016-10-27 NOTE — Progress Notes (Signed)
Patient given discharge instructions and all questions answered.  Patient discharged via wheelchair with all belongings.   

## 2016-10-27 NOTE — Progress Notes (Signed)
CM talked to patient about HHPT, patient refused and stated that she does not want any HHC at this time; [name] [phone]

## 2016-10-28 NOTE — Discharge Summary (Signed)
[facility] Discharge Summary   Patient: [name]:[id number]   PCP: Jani Gravel, MD DOB: Jul 30, 1945   Date of admission: 10/24/2016   Date of discharge: 10/27/2016    Discharge Diagnoses:  Principal Problem:   Acute respiratory failure with hypoxia [facility]) Active Problems:   Obesity   Essential hypertension   Chronic atrial fibrillation (HCC)   Elevated lactic acid level   Acute on chronic diastolic heart failure (HCC)   Acute diastolic heart failure ([place])   Admitted From: home Disposition:  Home with home health  Recommendations for Outpatient Follow-up:  1. Please follow up with PCP and urology   Follow-up Information    Jani Gravel, MD. Go on 11/24/2016.   Specialty:  Internal Medicine Why:  @16 :30pm Contact information: [place] [address] [phone]        Claybon Jabs, MD. Go on 10/29/2016.   Specialty:  Urology Contact information: [place] [address] [phone]          Diet recommendation: cardiac diet  Activity: The patient is advised to gradually reintroduce usual activities.  Discharge Condition: good  Code Status: full code  History of present illness: As per the H and P dictated on admission, "SHARIE AMORIN is a 71 y.o. female with medical history significant for chronic atrial fibrillation on eliquis, chronic diastolic heart failure, obesity and hypertension. Patient was recently discharged on 3/20 after an admission for bronchitis. She was discharged on a prednisone taper. Since discharge patient has had progressive shortness of breath, increase in weight and subsequently has developed significant dyspnea on exertion and orthopnea that has worsened over the past 48 hours. In review of the weight graph, in mid-March her weight was 230 lbs and current weight is 249 lbs. She has had a nonproductive, dry cough. She has not had any fevers or chills."  Hospital Course:  Summary of her  active problems in the hospital is as following. Acute respiratory failure with hypoxia  -Patient presents with progressive dyspnea on exertion and orthopnea with associated hypoxemia and weight gain consistent with likely heart failure exacerbation -Continue supportive care (O2)-wean oxygen as tolerated -CT chest: No active cardiopulmonary disease.Changing Lasix to by mouth.  Elevated lactic acid level -Likely secondary to hypoperfusion in setting of hypoxemic respiratory failure and heart failure -Lactic acid improved from 5.2 to 1.9 -No fever, leukocytosis or other signs of infection  Acute on chronic diastolic heart failure  -Presented with acute hypoxemic respiratory failure with wheezing and increased weight consistent with heart failure exacerbation -Weight mid March 2018 230 pounds with current weight 249 pounds -Last echocardiogram 2015: Preserved LV function w/grade 2 diastolic dysfunction -echocardiogram shows preserved EF with diastolic dysfunction -Daily weights and strict I/O Changing Lasix to by mouth.  Essential hypertension -Continue preadmission CCB- cardizem -Continue lasix  Chronic atrial fibrillation  -Currently maintaining sinus rhythm  -Continue CCBfor rate control-if echo demonstrates systolic dysfunction need to consider changing to BB -Continue eliquis -CHADVASc = 4  Morbid Obesity -Weight reduction strategies per primary care physician  Renal stone Right flank pain. 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 No obstructive uropathy  Started having right flank pain today. Will add Norco and Flomax to the regimen. Discussed with Dr. Jeffie Pollock with urology, recommended KUB, which is unremarkable for any ureteric or bladder stone. Also ultrasound renal  which is not showing hydronephrosis. patient has outpatient follow-up on  10/29/2016.  All other chronic medical condition were stable during the hospitalization.  Patient was seen by physical therapy, who recommended home health, which was arranged by Education officer, museum and case Freight forwarder. On the day of the discharge the patient's vitals were stable, and no other acute medical condition were reported by patient. the patient was felt safe to be discharge at home with home health.  Procedures and Results:  none   Consultations:  none  DISCHARGE MEDICATION: Discharge Medication List as of 10/27/2016 12:59 PM    START taking these medications   Details  HYDROcodone-acetaminophen (NORCO/VICODIN) 5-325 MG tablet Take 1 tablet by mouth every 6 (six) hours as needed for severe pain., Starting Tue 10/27/2016, Print    methocarbamol (ROBAXIN) 500 MG tablet Take 1 tablet (500 mg total) by mouth every 8 (eight) hours as needed for muscle spasms., Starting Tue 10/27/2016, Normal    tamsulosin (FLOMAX) 0.4 MG CAPS capsule Take 1 capsule (0.4 mg total) by mouth daily after breakfast., Starting Tue 10/27/2016, Normal      CONTINUE these medications which have CHANGED   Details  furosemide (LASIX) 20 MG tablet Take 1 tablet (20 mg total) by mouth 2 (two) times daily., Starting Tue 10/27/2016, Normal      CONTINUE these medications which have NOT CHANGED   Details  acetaminophen (TYLENOL) 325 MG tablet Take 650 mg by mouth every 6 (six) hours as needed for mild pain., Historical Med    albuterol (PROVENTIL HFA;VENTOLIN HFA) 108 (90 Base) MCG/ACT inhaler Inhale 1 puff into the lungs every 6 (six) hours as needed for wheezing or shortness of breath., Historical Med    apixaban (ELIQUIS) 5 MG TABS tablet Take 1 tablet (5 mg total) by mouth 2 (two) times daily., Starting Mon 09/14/2016, Print    chlorpheniramine-HYDROcodone (TUSSIONEX PENNKINETIC ER) 10-8 MG/5ML SUER Take 5 mLs by mouth every 12 (twelve) hours as needed for cough., Historical Med    diclofenac sodium (VOLTAREN) 1 %  GEL Apply 2 g topically daily as needed for pain., Starting Wed 10/14/2016, Historical Med    diltiazem (CARTIA XT) 120 MG 24 hr capsule Take 1 capsule (120 mg total) by mouth daily., Starting Fri 01/31/2016, Normal      STOP taking these medications     azithromycin (ZITHROMAX) 500 MG tablet      predniSONE (DELTASONE) 10 MG tablet        No Known Allergies Discharge Instructions    Diet - low sodium heart healthy    Complete by:  As directed    Discharge instructions    Complete by:  As directed    It is important that you read following instructions as well as go over your medication list with RN to help you understand your care after this hospitalization.  Discharge Instructions: Please follow-up with PCP in one week  Please request your primary care physician to go over all Hospital Tests and Procedure/Radiological results at the follow up,  Please get all Hospital records sent to your PCP by signing hospital release before you go home.   Do not drive, operating heavy machinery, perform activities at heights, swimming or participation in water activities or provide baby sitting services while you are on Pain, Sleep and Anxiety Medications; until you have been seen by Primary Care Physician or a Neurologist and advised to do so again. Do not take more than prescribed Pain, Sleep and Anxiety Medications. You were  cared for by a hospitalist during your hospital stay. If you have any questions about your discharge medications or the care you received while you were in the hospital after you are discharged, you can call the unit and ask to speak with the hospitalist on call if the hospitalist that took care of you is not available.  Once you are discharged, your primary care physician will handle any further medical issues. Please note that NO REFILLS for any discharge medications will be authorized once you are discharged, as it is imperative that you return to your primary care  physician (or establish a relationship with a primary care physician if you do not have one) for your aftercare needs so that they can reassess your need for medications and monitor your lab values. You Must read complete instructions/literature along with all the possible adverse reactions/side effects for all the Medicines you take and that have been prescribed to you. Take any new Medicines after you have completely understood and accept all the possible adverse reactions/side effects. Wear Seat belts while driving. If you have smoked or chewed Tobacco in the last 2 yrs please stop smoking and/or stop any Recreational drug use.   Increase activity slowly    Complete by:  As directed      Discharge Exam: Filed Weights   10/25/16 0610 10/26/16 0537 10/27/16 0500  Weight: 106 kg (233 lb 11.2 oz) 105.9 kg (233 lb 7.5 oz) 105.3 kg (232 lb 3.2 oz)   Vitals:   10/27/16 0500 10/27/16 1222  BP: 127/89 121/72  Pulse: 80 (!) 105  Resp: 18 18  Temp: 97.9 F (36.6 C) 98 F (36.7 C)   General: Appear in mild distress, no Rash; Oral Mucosa moist. Cardiovascular: S1 and S2 Present, no Murmur, no JVD Respiratory: Bilateral Air entry present and Clear to Auscultation, no Crackles, no wheezes Abdomen: Bowel Sound present, Soft and no tenderness Extremities: no Pedal edema, no calf tenderness Neurology: Grossly no focal neuro deficit.  The results of significant diagnostics from this hospitalization (including imaging, microbiology, ancillary and laboratory) are listed below for reference.    Significant Diagnostic Studies: Dg Chest 2 View  Result Date: 10/24/2016 CLINICAL DATA:  71 year old female with progressive shortness of breath and wheezing today. Initial encounter. EXAM: CHEST  2 VIEW COMPARISON:  09/28/2016 and earlier. FINDINGS: Upright AP and lateral views of the chest. Stable mild cardiomegaly and mediastinal contours. No pneumothorax, pulmonary edema, pleural effusion or confluent  pulmonary opacity. Cervical ACDF hardware again noted. No acute osseous abnormality identified. Negative visible bowel gas pattern. IMPRESSION: No acute cardiopulmonary abnormality. Electronically Signed   By: Genevie Ann M.D.   On: 10/24/2016 14:10   Ct Chest Wo Contrast  Result Date: 10/24/2016 CLINICAL DATA:  Acute hypoxemic respiratory failure with cardiac wheeze. Right-sided pain in the abdomen. EXAM: CT CHEST, ABDOMEN AND PELVIS WITHOUT CONTRAST TECHNIQUE: Multidetector CT imaging of the chest, abdomen and pelvis was performed following the standard protocol without IV contrast. COMPARISON:  None. FINDINGS: CT CHEST FINDINGS Cardiovascular: No significant vascular findings. Normal heart size. No pericardial effusion. Mediastinum/Nodes: No enlarged mediastinal, hilar, or axillary lymph nodes. Thyroid gland, trachea, and esophagus demonstrate no significant findings. Lungs/Pleura: Lungs are clear. Minimal subsegmental atelectasis in the lingula. No pleural effusion or pneumothorax. Musculoskeletal: ACDF of the visualized lower cervical spine. Striated appearance of the vertebral bodies from T1 through T3 may reflect vertebral body hemangiomas. No acute osseous abnormality. CT ABDOMEN PELVIS FINDINGS Hepatobiliary: Homogeneous appearance of the liver  without biliary dilatation. The gallbladder is not well-visualized and could be surgically absent. Pancreas: Unremarkable. No pancreatic ductal dilatation or surrounding inflammatory changes. Spleen: No splenomegaly or mass. Adrenals/Urinary Tract: Large staghorn calculus within the left renal collecting system. A nonobstructing 11 x 8 mm calculus is seen in the mid to lower pole of the right kidney with smaller calculi also seen in the upper and lower pole. No hydroureter is noted. The bladder is unremarkable. No bladder calcifications are seen. Stomach/Bowel: Appendectomy. No bowel obstruction or inflammation. Scattered colonic diverticulosis along the sigmoid  colon without acute diverticulitis. Normal small bowel rotation. Physiologic distention of the stomach. Vascular/Lymphatic: Aortic atherosclerosis. No enlarged abdominal or pelvic lymph nodes.Bold Reproductive: Status post hysterectomy. No adnexal masses. Other: No abdominal wall hernia or abnormality. No abdominopelvic ascites. Musculoskeletal: Degenerative disc disease L1 through S1 with discogenic sclerosis of the endplates and mild associated facet arthropathy. No acute osseous abnormality. IMPRESSION: 1. No active cardiopulmonary disease. 2. 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 No obstructive uropathy is identified. 3. Status post hysterectomy and appendectomy. 4. Sigmoid diverticulosis without acute diverticulitis. 5. ACDF of the visualized lower cervical spine. T1 through T3 vertebral body hemangiomas are suggested given their striated appearance. Lumbar spondylosis. Electronically Signed   By: Ashley Royalty M.D.   On: 10/24/2016 19:02   US Renal  Result Date: 10/26/2016 CLINICAL DATA:  Right flank pain EXAM: RENAL / URINARY TRACT ULTRASOUND COMPLETE COMPARISON:  CT abdomen pelvis 10/24/2016 FINDINGS: Right Kidney: Length: 12.1 cm. 24 mm nonobstructing stone right kidney unchanged from prior CT. Negative for hydronephrosis Left Kidney: Length: 12.7 cm. Partial staghorn calculus in the left renal pelvis with shadowing. This measures up to 5 cm in diameter. Negative for hydronephrosis. Bladder: Urinary bladder empty. IMPRESSION: Bilateral renal calculi with a partial staghorn calculus on the left. Negative for hydronephrosis. Electronically Signed   By: Franchot Gallo M.D.   On: 10/26/2016 16:29   Dg Abd 2 Views  Result Date: 10/26/2016 CLINICAL DATA:  History of kidney stones. Right flank pain since Saturday. EXAM: ABDOMEN - 2 VIEW COMPARISON:  Bilateral kidney stones unchanged since  the CT scan of 2 days ago. No definite ureteral stone. FINDINGS: There is a coarse staghorn calculus print affecting over the midpole of the left kidney. On the light at an approximately 12 x 15 mm stone projects over a mid pole calyx with 2 smaller stones projecting over lower pole calices. No definite ureteral or bladder stones. There tiny phleboliths within the pelvis. The bowel gas pattern is unremarkable. There is moderate multilevel degenerative disc disease of the mid and lower lumbar spine. IMPRESSION: Stable bilateral kidney stones including a left-sided staghorn calculus. No ureteral or bladder stones are observed. Electronically Signed   By: David  Martinique M.D.   On: 10/26/2016 11:06   Ct Renal Stone Study  Result Date: 10/24/2016 CLINICAL DATA:  Acute hypoxemic respiratory failure with cardiac wheeze. Right-sided pain in the abdomen. EXAM: CT CHEST, ABDOMEN AND PELVIS WITHOUT CONTRAST TECHNIQUE: Multidetector CT imaging of the chest, abdomen and pelvis was performed following the standard protocol without IV contrast. COMPARISON:  None. FINDINGS: CT CHEST FINDINGS Cardiovascular: No significant vascular findings. Normal heart size. No pericardial effusion. Mediastinum/Nodes: No enlarged mediastinal, hilar, or axillary lymph nodes. Thyroid gland, trachea, and esophagus demonstrate no significant findings. Lungs/Pleura: Lungs are clear. Minimal subsegmental atelectasis in the lingula. No pleural  effusion or pneumothorax. Musculoskeletal: ACDF of the visualized lower cervical spine. Striated appearance of the vertebral bodies from T1 through T3 may reflect vertebral body hemangiomas. No acute osseous abnormality. CT ABDOMEN PELVIS FINDINGS Hepatobiliary: Homogeneous appearance of the liver without biliary dilatation. The gallbladder is not well-visualized and could be surgically absent. Pancreas: Unremarkable. No pancreatic ductal dilatation or surrounding inflammatory changes. Spleen: No splenomegaly or  mass. Adrenals/Urinary Tract: Large staghorn calculus within the left renal collecting system. A nonobstructing 11 x 8 mm calculus is seen in the mid to lower pole of the right kidney with smaller calculi also seen in the upper and lower pole. No hydroureter is noted. The bladder is unremarkable. No bladder calcifications are seen. Stomach/Bowel: Appendectomy. No bowel obstruction or inflammation. Scattered colonic diverticulosis along the sigmoid colon without acute diverticulitis. Normal small bowel rotation. Physiologic distention of the stomach. Vascular/Lymphatic: Aortic atherosclerosis. No enlarged abdominal or pelvic lymph nodes.Bold Reproductive: Status post hysterectomy. No adnexal masses. Other: No abdominal wall hernia or abnormality. No abdominopelvic ascites. Musculoskeletal: Degenerative disc disease L1 through S1 with discogenic sclerosis of the endplates and mild associated facet arthropathy. No acute osseous abnormality. IMPRESSION: 1. No active cardiopulmonary disease. 2. 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 No obstructive uropathy is identified. 3. Status post hysterectomy and appendectomy. 4. Sigmoid diverticulosis without acute diverticulitis. 5. ACDF of the visualized lower cervical spine. T1 through T3 vertebral body hemangiomas are suggested given their striated appearance. Lumbar spondylosis. Electronically Signed   By: Ashley Royalty M.D.   On: 10/24/2016 19:02    Microbiology: No results found for this or any previous visit (from the past 240 hour(s)).   Labs: CBC:  Recent Labs Lab 10/24/16 1248 10/26/16 0258 10/27/16 0318  WBC 9.7 13.2* 10.8*  HGB 13.0 13.0 12.3  HCT 39.5 39.8 38.1  MCV 89.8 89.2 88.6  PLT 328 340 859   Basic Metabolic Panel:  Recent Labs Lab 10/24/16 1248 10/25/16 0558 10/26/16 0258 10/27/16 0318  NA 140 140 139 138  K 3.5 3.9  3.4* 3.6  CL 104 102 98* 101  CO2 26 26 26 26   GLUCOSE 152* 221* 150* 148*  BUN 13 17 25* 30*  CREATININE 0.77 0.72 0.94 0.76  CALCIUM 9.2 9.4 9.1 9.1  MG 2.0  --   --   --    Liver Function Tests: No results for input(s): AST, ALT, ALKPHOS, BILITOT, PROT, ALBUMIN in the last 168 hours. No results for input(s): LIPASE, AMYLASE in the last 168 hours. No results for input(s): AMMONIA in the last 168 hours. Cardiac Enzymes: No results for input(s): CKTOTAL, CKMB, CKMBINDEX, TROPONINI in the last 168 hours. BNP (last 3 results)  Recent Labs  09/26/16 1003 10/24/16 1248  BNP 47.4 57.9   CBG:  Recent Labs Lab 10/24/16 2052  GLUCAP 318*   Time spent: 35 minutes  Signed:  Emonii Wienke  [facility] 10/27/2016, 11:19 PM

## 2016-10-29 DIAGNOSIS — N200 Calculus of kidney: Secondary | ICD-10-CM | POA: Diagnosis not present

## 2016-11-13 ENCOUNTER — Telehealth: Payer: Self-pay | Admitting: Interventional Cardiology

## 2016-11-13 NOTE — Telephone Encounter (Signed)
Patient is not feeling well this morning.  She states she had some shortness of breath yesterday and last night.  She states she feels it may be the pollen.  Please call.Marland Kitchen

## 2016-11-13 NOTE — Telephone Encounter (Signed)
Spoke with pt and she states she has been SOB off and on for about a week.  Mainly occurs at night.  Feels it maybe allergy related.  Pt recently started using Claritin and Flonase to see if this helps improve sx.  Denies CP, dizziness or swelling,  Has used ProAir inhaler with improvement.  Pt has had 2 recent hospitalizations that showed sx are lung related.  Pt was seen by Nephrologist a few days ago and he told her she was in Afib with a controlled rate. No vitals available.   Pt has called PCP to see about getting a sooner appt but is awaiting a return call.  Advised pt I would send message to Dr. Tamala Julian for review and advisement.  Pt has appt with Dr. Tamala Julian on 11/27/16.

## 2016-11-13 NOTE — Telephone Encounter (Signed)
I will know what is going on. If she is in atrial fibrillation it could be causing shortness of breath. We have made the diagnoses of atrial fibrillation, should've done an EKG to be absolutely sure that the diagnosis is correct. The only way to figure this out's have a repeat EKG performed. This could be done at primary care or we can arrange for it did be done sometime next week Heart care

## 2016-11-13 NOTE — Telephone Encounter (Signed)
Spoke with pt and informed her of information provided by Dr. Tamala Julian.  Pt agreeable to come in and see an APP for eval next week.  Scheduled pt to see Cecilie Kicks, NP on 11/16/16.  Advised pt when appropriate to go to urgent care or ER.  Pt appreciative for assistance.

## 2016-11-16 ENCOUNTER — Ambulatory Visit: Payer: PPO | Admitting: Cardiology

## 2016-11-16 DIAGNOSIS — R0600 Dyspnea, unspecified: Secondary | ICD-10-CM | POA: Diagnosis not present

## 2016-11-16 DIAGNOSIS — J301 Allergic rhinitis due to pollen: Secondary | ICD-10-CM | POA: Diagnosis not present

## 2016-11-16 DIAGNOSIS — I4891 Unspecified atrial fibrillation: Secondary | ICD-10-CM | POA: Diagnosis not present

## 2016-11-24 DIAGNOSIS — R109 Unspecified abdominal pain: Secondary | ICD-10-CM | POA: Diagnosis not present

## 2016-11-24 DIAGNOSIS — R0600 Dyspnea, unspecified: Secondary | ICD-10-CM | POA: Diagnosis not present

## 2016-11-24 DIAGNOSIS — J45909 Unspecified asthma, uncomplicated: Secondary | ICD-10-CM | POA: Diagnosis not present

## 2016-11-26 NOTE — Progress Notes (Signed)
Cardiology Office Note    Date:  11/27/2016   ID:  [name], DOB Apr 19, 1946, MRN [id number]  PCP:  Emily Gravel, MD  Cardiologist: Emily Grooms, MD   Chief Complaint  Patient presents with  . Shortness of Breath    History of Present Illness:  [name] is a 71 y.o. female follow-up paroxysmal atrial fibrillation, chronic diastolic HF, and chronic anticoagulation therapy.   2 recent emergency room visits for dyspnea and wheezing. She continues to have difficulty with breathing. She has had 2 hospitalizations this spring, once in March and the second time in April. In March she had lactic acidosis. There is no evidence of fluid overload on chest x-ray. She did not have episodes of atrial fibrillation. BNP on each occasion was less than 100. Pulmonary CT angioma was not performed because she is on chronic anticoagulation and the assumption was that PE was not likely. A repeat echo demonstrated normal LV function and did not mention the right ventricle.  Past Medical History:  Diagnosis Date  . Arthritis    both knees  . Carpal tunnel syndrome, bilateral    both hands go to sleep at times from elbows down  . DDD (degenerative disc disease), lumbosacral   . First degree AV block   . History of kidney stones   . PONV (postoperative nausea and vomiting)   . Postlaminectomy syndrome   . Renal calculi    BILATERAL  . Right ureteral stone     Past Surgical History:  Procedure Laterality Date  . ANTERIOR CERVICAL DECOMP/DISCECTOMY FUSION  03-18-2011   C3 -- C7  . APPENDECTOMY    . CARDIAC CATHETERIZATION N/A 01/16/2015   Procedure: Left Heart Cath and Coronary Angiography;  Surgeon: Emily Crome, MD;  Location: [facility];  Service: Cardiovascular;  Laterality: N/A;  . CHOLECYSTECTOMY OPEN  1970'S  . CYSTOSCOPY WITH RETROGRADE PYELOGRAM, URETEROSCOPY AND STENT PLACEMENT N/A 02/13/2013   Procedure: CYSTOSCOPY WITH RETROGRADE PYELOGRAM, URETEROSCOPY AND  LITHOTRIPSY ;  Surgeon: Emily Jabs, MD;  Location: [facility];  Service: Urology;  Laterality: N/A;  . EXTRACORPOREAL SHOCK WAVE LITHOTRIPSY Right 05-16-2012  . HOLMIUM LASER APPLICATION Right 01/12/7105   Procedure: HOLMIUM LASER APPLICATION;  Surgeon: Emily Jabs, MD;  Location[facility];  Service: Urology;  Laterality: Right;  . KNEE ARTHROSCOPY Bilateral   . LUMBAR LAMINECTOMY  X2  1960'S  . NASAL SEPTUM SURGERY  1970'S  . SHOULDER OPEN ROTATOR CUFF REPAIR Bilateral LEFT 01-11-2001/   RIGHT 07-21-2002  . TOTAL KNEE ARTHROPLASTY  04/13/2012   Procedure: TOTAL KNEE ARTHROPLASTY;  Surgeon: Emily Alf, MD;  Location: [facility];  Service: Orthopedics;  Laterality: Left;  . TOTAL KNEE ARTHROPLASTY  08/08/2012   Procedure: TOTAL KNEE ARTHROPLASTY;  Surgeon: Emily Alf, MD;  Location: [facility];  Service: Orthopedics;  Laterality: Right;  . TRANSTHORACIC ECHOCARDIOGRAM  03-29-2011   MODERATE LVH/ EF 60-65%/ MILD MR  . VAGINAL HYSTERECTOMY  1960'S    Current Medications: Outpatient Medications Prior to Visit  Medication Sig Dispense Refill  . acetaminophen (TYLENOL) 325 MG tablet Take 650 mg by mouth every 6 (six) hours as needed for mild pain.    Marland Kitchen albuterol (PROVENTIL HFA;VENTOLIN HFA) 108 (90 Base) MCG/ACT inhaler Inhale 1 puff into the lungs every 6 (six) hours as needed for wheezing or shortness of breath.    Marland Kitchen apixaban (ELIQUIS) 5 MG TABS tablet Take 1 tablet (5  mg total) by mouth 2 (two) times daily. 180 tablet 3  . chlorpheniramine-HYDROcodone (TUSSIONEX PENNKINETIC ER) 10-8 MG/5ML SUER Take 5 mLs by mouth every 12 (twelve) hours as needed for cough.    . diclofenac sodium (VOLTAREN) 1 % GEL Apply 2 g topically daily as needed for pain.    Marland Kitchen diltiazem (CARTIA XT) 120 MG 24 hr capsule Take 1 capsule (120 mg total) by mouth daily. 30 capsule 11  . HYDROcodone-acetaminophen (NORCO/VICODIN) 5-325 MG tablet Take 1 tablet by mouth every 6 (six) hours as  needed for severe pain. 20 tablet 0  . methocarbamol (ROBAXIN) 500 MG tablet Take 1 tablet (500 mg total) by mouth every 8 (eight) hours as needed for muscle spasms. 10 tablet 0  . tamsulosin (FLOMAX) 0.4 MG CAPS capsule Take 1 capsule (0.4 mg total) by mouth daily after breakfast. 30 capsule 0  . furosemide (LASIX) 20 MG tablet Take 1 tablet (20 mg total) by mouth 2 (two) times daily. 30 tablet 0   No facility-administered medications prior to visit.      Allergies:   Patient has no known allergies.   Social History   Social History  . Marital status: Married    Spouse name: N/A  . Number of children: N/A  . Years of education: N/A   Social History Main Topics  . Smoking status: Never Smoker  . Smokeless tobacco: Never Used  . Alcohol use No  . Drug use: No  . Sexual activity: Not Asked   Other Topics Concern  . None   Social History Narrative  . None     Family History:  The patient's family history includes Diabetes in her brother, brother, and sister; Diabetes type II in her mother; Heart disease in her brother and brother; Hypertension in her brother, brother, and sister; Lung cancer in her father.   ROS:   Please see the history of present illness.    Wheezing, cough, orthopnea, PND, excessive fatigue, and ankle swelling.  All other systems reviewed and are negative.   PHYSICAL EXAM:   VS:  BP 140/70   Pulse 76   Ht 5\' 2"  (1.575 m)   Wt 240 lb 6.4 oz (109 kg)   SpO2 97%   BMI 43.97 kg/m    GEN: Well nourished, well developed, in no acute distress  HEENT: normal  Neck: no JVD, carotid bruits, or masses Cardiac: RRR; no murmurs, rubs, or gallops,no edema  Respiratory:  clear to auscultation bilaterally, normal work of breathing GI: soft, nontender, nondistended, + BS MS: no deformity or atrophy  Skin: warm and dry, no rash Neuro:  Alert and Oriented x 3, Strength and sensation are intact Psych: euthymic mood, full affect  Wt Readings from Last 3  Encounters:  11/27/16 240 lb 6.4 oz (109 kg)  10/27/16 232 lb 3.2 oz (105.3 kg)  09/29/16 250 lb 11.2 oz (113.7 kg)      Studies/Labs Reviewed:   EKG:  EKG  Sinus rhythm with nonspecific T-wave abnormality. First-degree AV block at 256 ms. No change compared to prior.  Recent Labs: 09/28/2016: ALT 17 10/24/2016: B Natriuretic Peptide 57.9; Magnesium 2.0 10/27/2016: BUN 30; Creatinine, Ser 0.76; Hemoglobin 12.3; Platelets 326; Potassium 3.6; Sodium 138   Lipid Panel No results found for: CHOL, TRIG, HDL, CHOLHDL, VLDL, LDLCALC, LDLDIRECT  Additional studies/ records that were reviewed today include:  Cardiac catheterization to July 2016: Conclusion    Normal coronary arteries  Normal left ventricular systolic function  False  positive myocardial perfusion study   RECOMMENDATIONS:  No further ischemic evaluation is necessary    ECHOCARDIOGRAM 10/25/16: ------------------------------------------------------------------- Study Conclusions  - Left ventricle: The cavity size was normal. There was mild   concentric hypertrophy. Systolic function was normal. The   estimated ejection fraction was in the range of 55% to 60%. Wall   motion was normal; there were no regional wall motion   abnormalities. Doppler parameters are consistent with abnormal   left ventricular relaxation (grade 1 diastolic dysfunction). - Mitral valve: Valve area by pressure half-time: 1.76 cm^2. - Left atrium: The atrium was mildly to moderately dilated.    ASSESSMENT:    1. Chronic diastolic heart failure (HCC)   2. Paroxysmal atrial fibrillation ([place])   3. Essential hypertension   4. Chronic anticoagulation      PLAN:  In order of problems listed above:  1. Dyspnea wheezing seem unlikely related to diastolic heart failure but for the next 4 doses of furosemide we will double the intensity to remove volume and see if this improves breathing. Furthermore we will change her daily dose to 40  mg once per day rather than 20 mg twice a day. She should have a basic metabolic panel at Dr. Julianne Rice office on Monday. She should notify us early next week if fluid removal helps breathing. 2. Sinus rhythm documented by EKG today. Reviewed each tracing from the 2 recent hospitalizations and no documented atrial fib. Therefore, it does not appear that atrial fibrillation is involved in her dyspnea complaints. 3. Adequate control and more aggressive diuresis will help to optimize control. Basic metabolic panel in 5 days. 4. Continue chronic therapy for stroke prophylaxis.  Overall, the respiratory complaints seem to be more reactive airways related. She has not grossly volume overloaded. To be aggressive and exclude any cardiac component to the dyspnea, we will slightly increase fluid removal. She is up 8 pounds since discharge from the hospital in April. A basic metabolic panel should be done Monday with Dr. Maudie Mercury. Plan three-month follow-up with APP in 6 months with me    Medication Adjustments/Labs and Tests Ordered: Current medicines are reviewed at length with the patient today.  Concerns regarding medicines are outlined above.  Medication changes, Labs and Tests ordered today are listed in the Patient Instructions below. Patient Instructions  Medication Instructions:  1) INCREASE Furosemide to 40mg  twice daily today and tomorrow.  On Sunday DECREASE to 40mg  once daily.  Labwork: BMET when you see your Primary Care Physician.  Please give them prescription provided and have them fax results to [phone].  Testing/Procedures: None  Follow-Up: Your physician recommends that you schedule a follow-up appointment in: 3 months with a PA or NP.  Your physician wants you to follow-up in: 6 months with Dr. Tamala Julian.  You will receive a reminder letter in the mail two months in advance. If you don't receive a letter, please call our office to schedule the follow-up appointment.    Any Other Special  Instructions Will Be Listed Below (If Applicable).  Please contact our office on Monday if your breathing has not improved.    If you need a refill on your cardiac medications before your next appointment, please call your pharmacy.      Signed, Emily Grooms, MD  11/27/2016 8:55 AM    [facility] [place], [place], [place]  [address] Phone: [phone]; Fax: [phone]

## 2016-11-27 ENCOUNTER — Ambulatory Visit (INDEPENDENT_AMBULATORY_CARE_PROVIDER_SITE_OTHER): Payer: PPO | Admitting: Interventional Cardiology

## 2016-11-27 ENCOUNTER — Encounter: Payer: Self-pay | Admitting: Interventional Cardiology

## 2016-11-27 VITALS — BP 140/70 | HR 76 | Ht 62.0 in | Wt 240.4 lb

## 2016-11-27 DIAGNOSIS — I5032 Chronic diastolic (congestive) heart failure: Secondary | ICD-10-CM | POA: Diagnosis not present

## 2016-11-27 DIAGNOSIS — Z7901 Long term (current) use of anticoagulants: Secondary | ICD-10-CM

## 2016-11-27 DIAGNOSIS — I480 Paroxysmal atrial fibrillation: Secondary | ICD-10-CM | POA: Diagnosis not present

## 2016-11-27 DIAGNOSIS — I10 Essential (primary) hypertension: Secondary | ICD-10-CM | POA: Diagnosis not present

## 2016-11-27 MED ORDER — POTASSIUM CHLORIDE CRYS ER 20 MEQ PO TBCR: 20.0000 meq | EXTENDED_RELEASE_TABLET | Freq: Every day | ORAL | 3 refills | Status: DC

## 2016-11-27 MED ORDER — FUROSEMIDE 40 MG PO TABS: 40.0000 mg | ORAL_TABLET | Freq: Every day | ORAL | 3 refills | Status: DC

## 2016-11-27 NOTE — Patient Instructions (Addendum)
Medication Instructions:  1) INCREASE Furosemide to 40mg  twice daily today and tomorrow.  On Sunday DECREASE to 40mg  once daily.  Labwork: BMET when you see your Primary Care Physician.  Please give them prescription provided and have them fax results to [phone]. 2) START Potassium 28mEq once daily.  Testing/Procedures: None  Follow-Up: Your physician recommends that you schedule a follow-up appointment in: 3 months with a PA or NP.  Your physician wants you to follow-up in: 6 months with Dr. Tamala Julian.  You will receive a reminder letter in the mail two months in advance. If you don't receive a letter, please call our office to schedule the follow-up appointment.    Any Other Special Instructions Will Be Listed Below (If Applicable).  Please contact our office on Monday if your breathing has not improved.    If you need a refill on your cardiac medications before your next appointment, please call your pharmacy.

## 2016-11-30 ENCOUNTER — Other Ambulatory Visit (HOSPITAL_COMMUNITY): Payer: Self-pay | Admitting: Respiratory Therapy

## 2016-11-30 ENCOUNTER — Telehealth: Payer: Self-pay | Admitting: Interventional Cardiology

## 2016-11-30 DIAGNOSIS — R609 Edema, unspecified: Secondary | ICD-10-CM | POA: Diagnosis not present

## 2016-11-30 DIAGNOSIS — R062 Wheezing: Secondary | ICD-10-CM | POA: Diagnosis not present

## 2016-11-30 DIAGNOSIS — I4891 Unspecified atrial fibrillation: Secondary | ICD-10-CM | POA: Diagnosis not present

## 2016-11-30 DIAGNOSIS — R0600 Dyspnea, unspecified: Secondary | ICD-10-CM

## 2016-11-30 NOTE — Addendum Note (Signed)
Addended by: Briant Cedar on: 11/30/2016 12:47 PM   Modules accepted: Orders

## 2016-11-30 NOTE — Telephone Encounter (Signed)
Follow Up:   Pt said Dr Tamala Julian told her to call today  and give his nurse an update on how she is doing.

## 2016-11-30 NOTE — Telephone Encounter (Signed)
Left message to call back  

## 2016-12-02 NOTE — Telephone Encounter (Addendum)
Spoke with pt and she states that her breathing is much better and she just generally feels better.  States weight was down to 238lbs when she seen Dr. Maudie Mercury on Monday.  Had labs drawn at Dr. Julianne Rice office on Monday.  Pt appreciative for call.  Left a message for medical records at Dr. Julianne Rice office to send over labs.  Made Dr. Tamala Julian aware that pt is feeling better and labs have been requested.

## 2016-12-03 ENCOUNTER — Encounter (HOSPITAL_COMMUNITY): Payer: PPO

## 2016-12-15 ENCOUNTER — Ambulatory Visit (HOSPITAL_COMMUNITY)
Admission: RE | Admit: 2016-12-15 | Discharge: 2016-12-15 | Disposition: A | Discharge status: Home / Self Care | Payer: PPO | Source: Ambulatory Visit | Attending: Internal Medicine | Admitting: Internal Medicine

## 2016-12-15 ENCOUNTER — Encounter (HOSPITAL_COMMUNITY): Payer: PPO

## 2016-12-15 DIAGNOSIS — R0600 Dyspnea, unspecified: Secondary | ICD-10-CM | POA: Insufficient documentation

## 2016-12-15 LAB — PULMONARY FUNCTION TEST
DL/VA % PRED: 88 %
DL/VA: 4.01 ml/min/mmHg/L
DLCO unc % pred: 72 %
DLCO unc: 15.65 ml/min/mmHg
FEF 25-75 PRE: 1.8 L/s
FEF2575-%PRED-PRE: 106 %
FEV1-%PRED-PRE: 96 %
FEV1-Pre: 1.95 L
FEV1FVC-%Pred-Pre: 105 %
FEV6-%Pred-Pre: 96 %
FEV6-PRE: 2.46 L
FEV6FVC-%PRED-PRE: 105 %
FVC-%PRED-PRE: 91 %
FVC-PRE: 2.46 L
Pre FEV1/FVC ratio: 79 %
Pre FEV6/FVC Ratio: 100 %
RV % pred: 115 %
RV: 2.47 L
TLC % pred: 106 %
TLC: 5.04 L

## 2016-12-24 DIAGNOSIS — R0602 Shortness of breath: Secondary | ICD-10-CM | POA: Diagnosis not present

## 2017-01-05 DIAGNOSIS — D485 Neoplasm of uncertain behavior of skin: Secondary | ICD-10-CM | POA: Diagnosis not present

## 2017-01-05 DIAGNOSIS — D225 Melanocytic nevi of trunk: Secondary | ICD-10-CM | POA: Diagnosis not present

## 2017-01-05 DIAGNOSIS — L720 Epidermal cyst: Secondary | ICD-10-CM | POA: Diagnosis not present

## 2017-01-05 DIAGNOSIS — Z872 Personal history of diseases of the skin and subcutaneous tissue: Secondary | ICD-10-CM | POA: Diagnosis not present

## 2017-01-05 DIAGNOSIS — D1801 Hemangioma of skin and subcutaneous tissue: Secondary | ICD-10-CM | POA: Diagnosis not present

## 2017-01-05 DIAGNOSIS — L814 Other melanin hyperpigmentation: Secondary | ICD-10-CM | POA: Diagnosis not present

## 2017-01-05 DIAGNOSIS — L821 Other seborrheic keratosis: Secondary | ICD-10-CM | POA: Diagnosis not present

## 2017-01-15 DIAGNOSIS — I4891 Unspecified atrial fibrillation: Secondary | ICD-10-CM | POA: Diagnosis not present

## 2017-01-15 DIAGNOSIS — E7439 Other disorders of intestinal carbohydrate absorption: Secondary | ICD-10-CM | POA: Diagnosis not present

## 2017-01-22 ENCOUNTER — Other Ambulatory Visit: Payer: Self-pay | Admitting: Interventional Cardiology

## 2017-01-22 DIAGNOSIS — I10 Essential (primary) hypertension: Secondary | ICD-10-CM | POA: Diagnosis not present

## 2017-01-22 DIAGNOSIS — I4891 Unspecified atrial fibrillation: Secondary | ICD-10-CM | POA: Diagnosis not present

## 2017-01-22 DIAGNOSIS — E118 Type 2 diabetes mellitus with unspecified complications: Secondary | ICD-10-CM | POA: Diagnosis not present

## 2017-03-02 ENCOUNTER — Ambulatory Visit: Payer: PPO | Admitting: Nurse Practitioner

## 2017-03-03 DIAGNOSIS — E118 Type 2 diabetes mellitus with unspecified complications: Secondary | ICD-10-CM | POA: Diagnosis not present

## 2017-03-12 DIAGNOSIS — I4891 Unspecified atrial fibrillation: Secondary | ICD-10-CM | POA: Diagnosis not present

## 2017-03-12 DIAGNOSIS — E1142 Type 2 diabetes mellitus with diabetic polyneuropathy: Secondary | ICD-10-CM | POA: Diagnosis not present

## 2017-03-12 DIAGNOSIS — E118 Type 2 diabetes mellitus with unspecified complications: Secondary | ICD-10-CM | POA: Diagnosis not present

## 2017-03-12 DIAGNOSIS — J301 Allergic rhinitis due to pollen: Secondary | ICD-10-CM | POA: Diagnosis not present

## 2017-04-09 DIAGNOSIS — Z1231 Encounter for screening mammogram for malignant neoplasm of breast: Secondary | ICD-10-CM | POA: Diagnosis not present

## 2017-04-16 DIAGNOSIS — Z23 Encounter for immunization: Secondary | ICD-10-CM | POA: Diagnosis not present

## 2017-04-28 ENCOUNTER — Other Ambulatory Visit: Payer: Self-pay | Admitting: Interventional Cardiology

## 2017-05-06 DIAGNOSIS — M47816 Spondylosis without myelopathy or radiculopathy, lumbar region: Secondary | ICD-10-CM | POA: Diagnosis not present

## 2017-05-31 ENCOUNTER — Other Ambulatory Visit: Payer: Self-pay | Admitting: Interventional Cardiology

## 2017-05-31 NOTE — Telephone Encounter (Signed)
Medication Detail    Disp Refills Start End   CARTIA XT 120 MG 24 hr capsule 90 capsule 1 04/28/2017    Sig: TAKE 1 CAPSULE(120 MG) BY MOUTH DAILY   Sent to pharmacy as: CARTIA XT 120 MG 24 hr capsule   Notes to Pharmacy: **Patient requests 90 days supply**   E-Prescribing Status: Receipt confirmed by pharmacy (04/28/2017 11:22 AM EDT)   Pharmacy   [id number] - [place], [address] [place]

## 2017-06-01 NOTE — Telephone Encounter (Signed)
Medication Detail    Disp Refills Start End   CARTIA XT 120 MG 24 hr capsule 90 capsule 1 04/28/2017    Sig: TAKE 1 CAPSULE(120 MG) BY MOUTH DAILY   Sent to pharmacy as: CARTIA XT 120 MG 24 hr capsule   Notes to Pharmacy: **Patient requests 90 days supply**   E-Prescribing Status: Receipt confirmed by pharmacy (04/28/2017 11:22 AM EDT)   Pharmacy   [id number] - [place], [address] [place]

## 2017-06-02 ENCOUNTER — Other Ambulatory Visit: Payer: Self-pay | Admitting: Interventional Cardiology

## 2017-06-02 NOTE — Telephone Encounter (Signed)
Call patient back to let her know verbal Rx was given and she call pick her medication up when she is ready. Patient appreciative of call.

## 2017-06-02 NOTE — Telephone Encounter (Signed)
Medication Detail    Disp Refills Start End   CARTIA XT 120 MG 24 hr capsule 90 capsule 1 04/28/2017    Sig: TAKE 1 CAPSULE(120 MG) BY MOUTH DAILY   Sent to pharmacy as: CARTIA XT 120 MG 24 hr capsule   Notes to Pharmacy: **Patient requests 90 days supply**   E-Prescribing Status: Receipt confirmed by pharmacy (04/28/2017 11:22 AM EDT)   Pharmacy   [id number] - [place], [address]

## 2017-06-11 DIAGNOSIS — E118 Type 2 diabetes mellitus with unspecified complications: Secondary | ICD-10-CM | POA: Diagnosis not present

## 2017-06-17 DIAGNOSIS — R1012 Left upper quadrant pain: Secondary | ICD-10-CM | POA: Diagnosis not present

## 2017-06-17 DIAGNOSIS — R1084 Generalized abdominal pain: Secondary | ICD-10-CM | POA: Diagnosis not present

## 2017-06-18 DIAGNOSIS — I4891 Unspecified atrial fibrillation: Secondary | ICD-10-CM | POA: Diagnosis not present

## 2017-06-18 DIAGNOSIS — E118 Type 2 diabetes mellitus with unspecified complications: Secondary | ICD-10-CM | POA: Diagnosis not present

## 2017-08-11 DIAGNOSIS — M1811 Unilateral primary osteoarthritis of first carpometacarpal joint, right hand: Secondary | ICD-10-CM | POA: Diagnosis not present

## 2017-08-11 DIAGNOSIS — G5601 Carpal tunnel syndrome, right upper limb: Secondary | ICD-10-CM | POA: Diagnosis not present

## 2017-08-11 DIAGNOSIS — M79641 Pain in right hand: Secondary | ICD-10-CM | POA: Diagnosis not present

## 2017-08-12 DIAGNOSIS — M47816 Spondylosis without myelopathy or radiculopathy, lumbar region: Secondary | ICD-10-CM | POA: Diagnosis not present

## 2017-08-13 DIAGNOSIS — R310 Gross hematuria: Secondary | ICD-10-CM | POA: Diagnosis not present

## 2017-08-13 DIAGNOSIS — N200 Calculus of kidney: Secondary | ICD-10-CM | POA: Diagnosis not present

## 2017-08-17 ENCOUNTER — Telehealth: Payer: Self-pay

## 2017-08-17 NOTE — Telephone Encounter (Signed)
   [facility] Pre-operative Risk Assessment    Request for surgical clearance:  1. What type of surgery is being performed? Right index finger:right thumb, index and middle finger DIP fushions, Rt CTR   2. When is this surgery scheduled? 09/07/17   3. Are there any medications that need to be held prior to surgery and how long?None listed   4. Practice name and name of physician performing surgery? [facility]  Dr. Amedeo Plenty   5. What is your office phone and fax number? Ph [phone] FAX: [place]   6. Anesthesia type (None, local, MAC, general) ? Not stated (have left message for [facility] requesting anesthesia type)   Tod Persia 08/17/2017, 12:08 PM  _________________________________________________________________   (provider comments below)

## 2017-08-17 NOTE — Telephone Encounter (Signed)
Spoke with [name] at [facility].   They need guidance regarding patients [name]  Patient will be under general anesthesia

## 2017-08-18 NOTE — Telephone Encounter (Signed)
Patient with diagnosis of Afib on Eliquis for anticoagulation.    Procedure: finger surgery Date of procedure: 09/07/17  CHADS2-VASc score of  4 (CHF, HTN, AGE, DM2, stroke/tia x 2, CAD, AGE, female)  CrCl 185ml/min  Per office protocol, patient can hold Eliquis for 24 hours prior to procedure.

## 2017-08-18 NOTE — Telephone Encounter (Signed)
   Primary Cardiologist: No primary care provider on file.  Chart reviewed as part of pre-operative protocol coverage. Patient was contacted 08/18/2017 in reference to pre-operative risk assessment for pending surgery as outlined below.  [name] was last seen on 11/27/16 by Dr. Tamala Julian.  Since that day, [name] has done well. She can perform at least 4.0 METS and does not have a history of CAD.  Therefore, based on ACC/AHA guidelines, the patient would be at acceptable risk for the planned procedure without further cardiovascular testing.   Surgeon's office has requested guidance for eliquis. I will forward this to pharmacy.  Please call with questions.  Tami Lin Duke, PA 08/18/2017, 1:21 PM

## 2017-08-29 IMAGING — DX DG CHEST 2V
2 series · 2 of 2 positions shown · non-contrast
Comparison: 09/28/2016 and earlier.

CLINICAL DATA: 71-year-old female with progressive shortness of
breath and wheezing today. Initial encounter.

EXAM:
CHEST  2 VIEW

[chest lat]
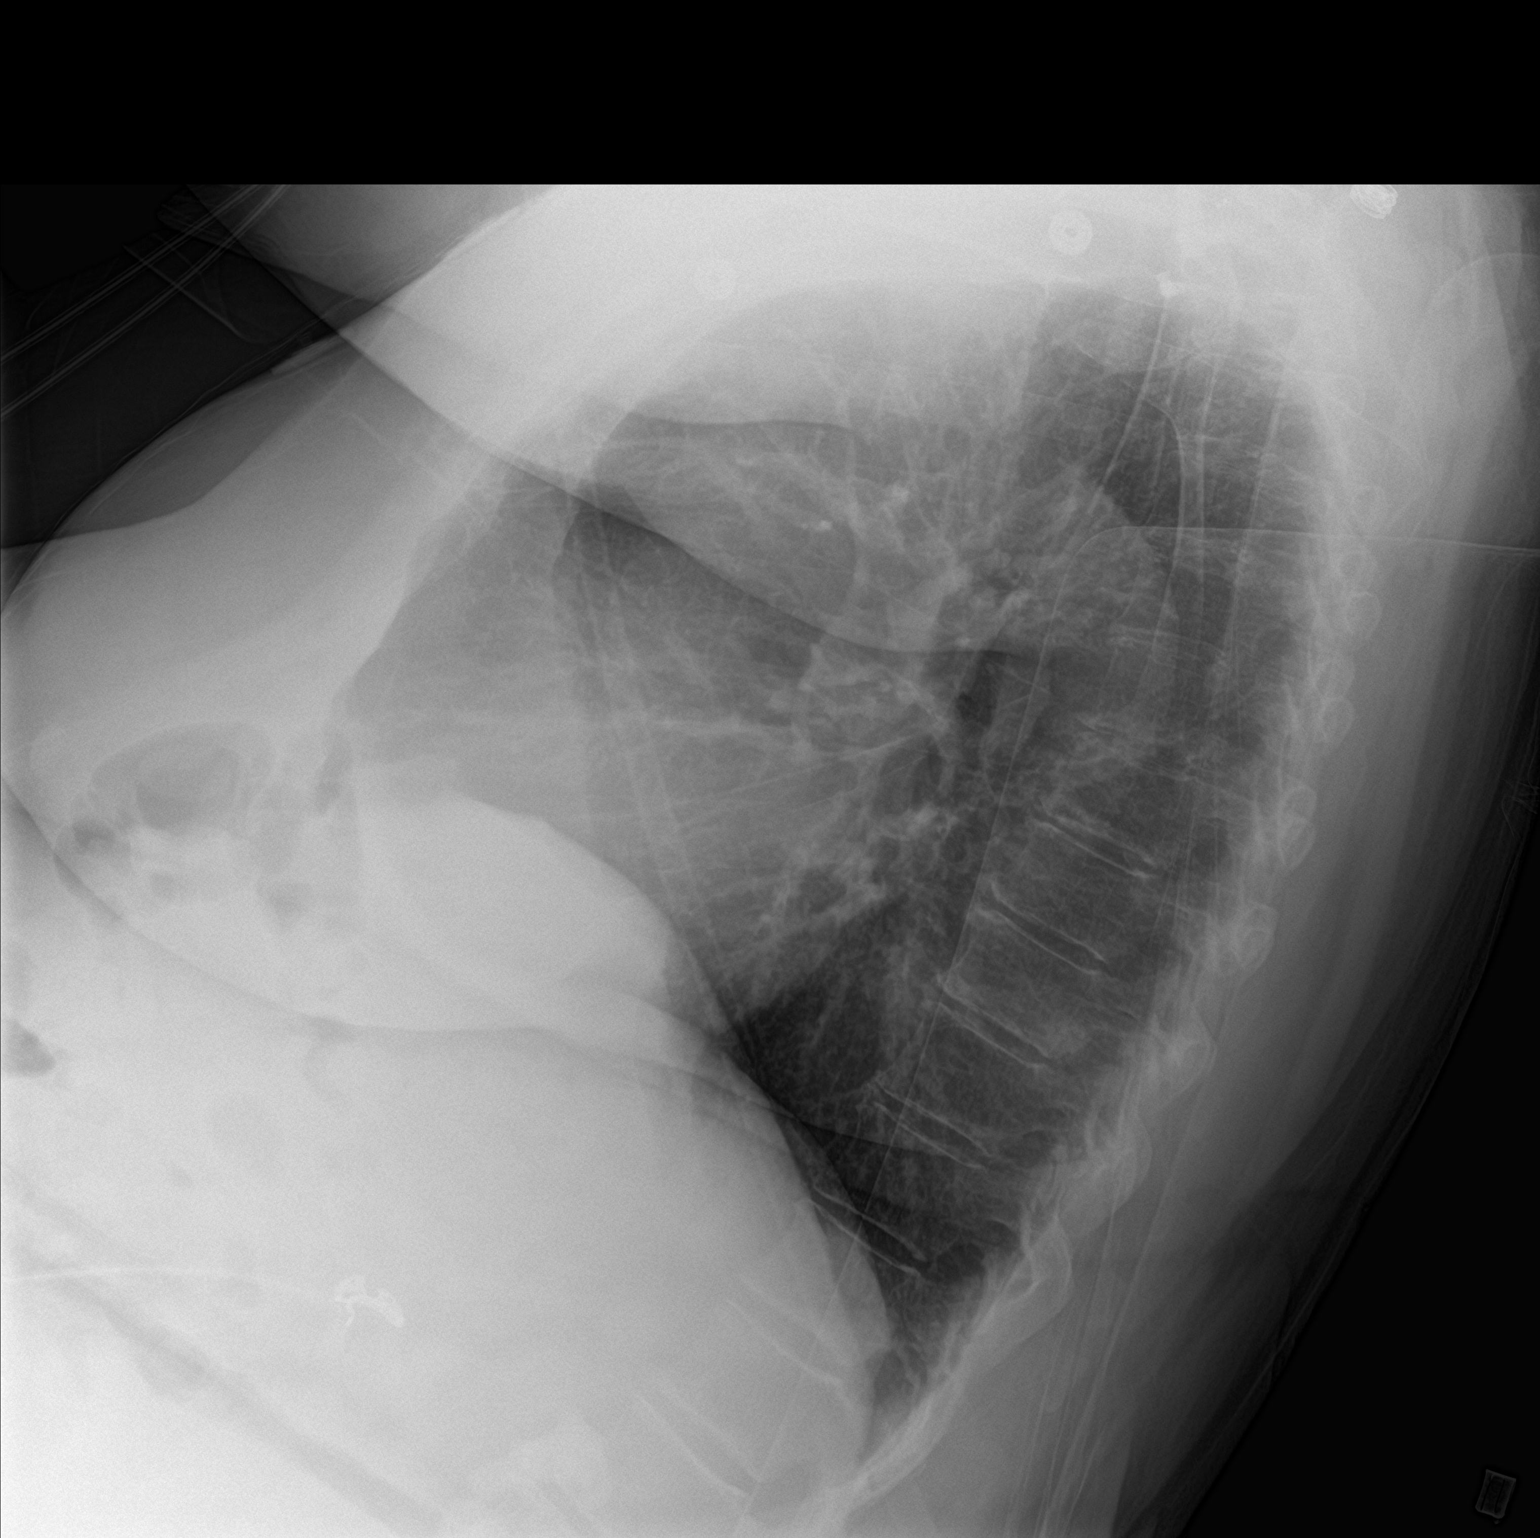

[chest ap]
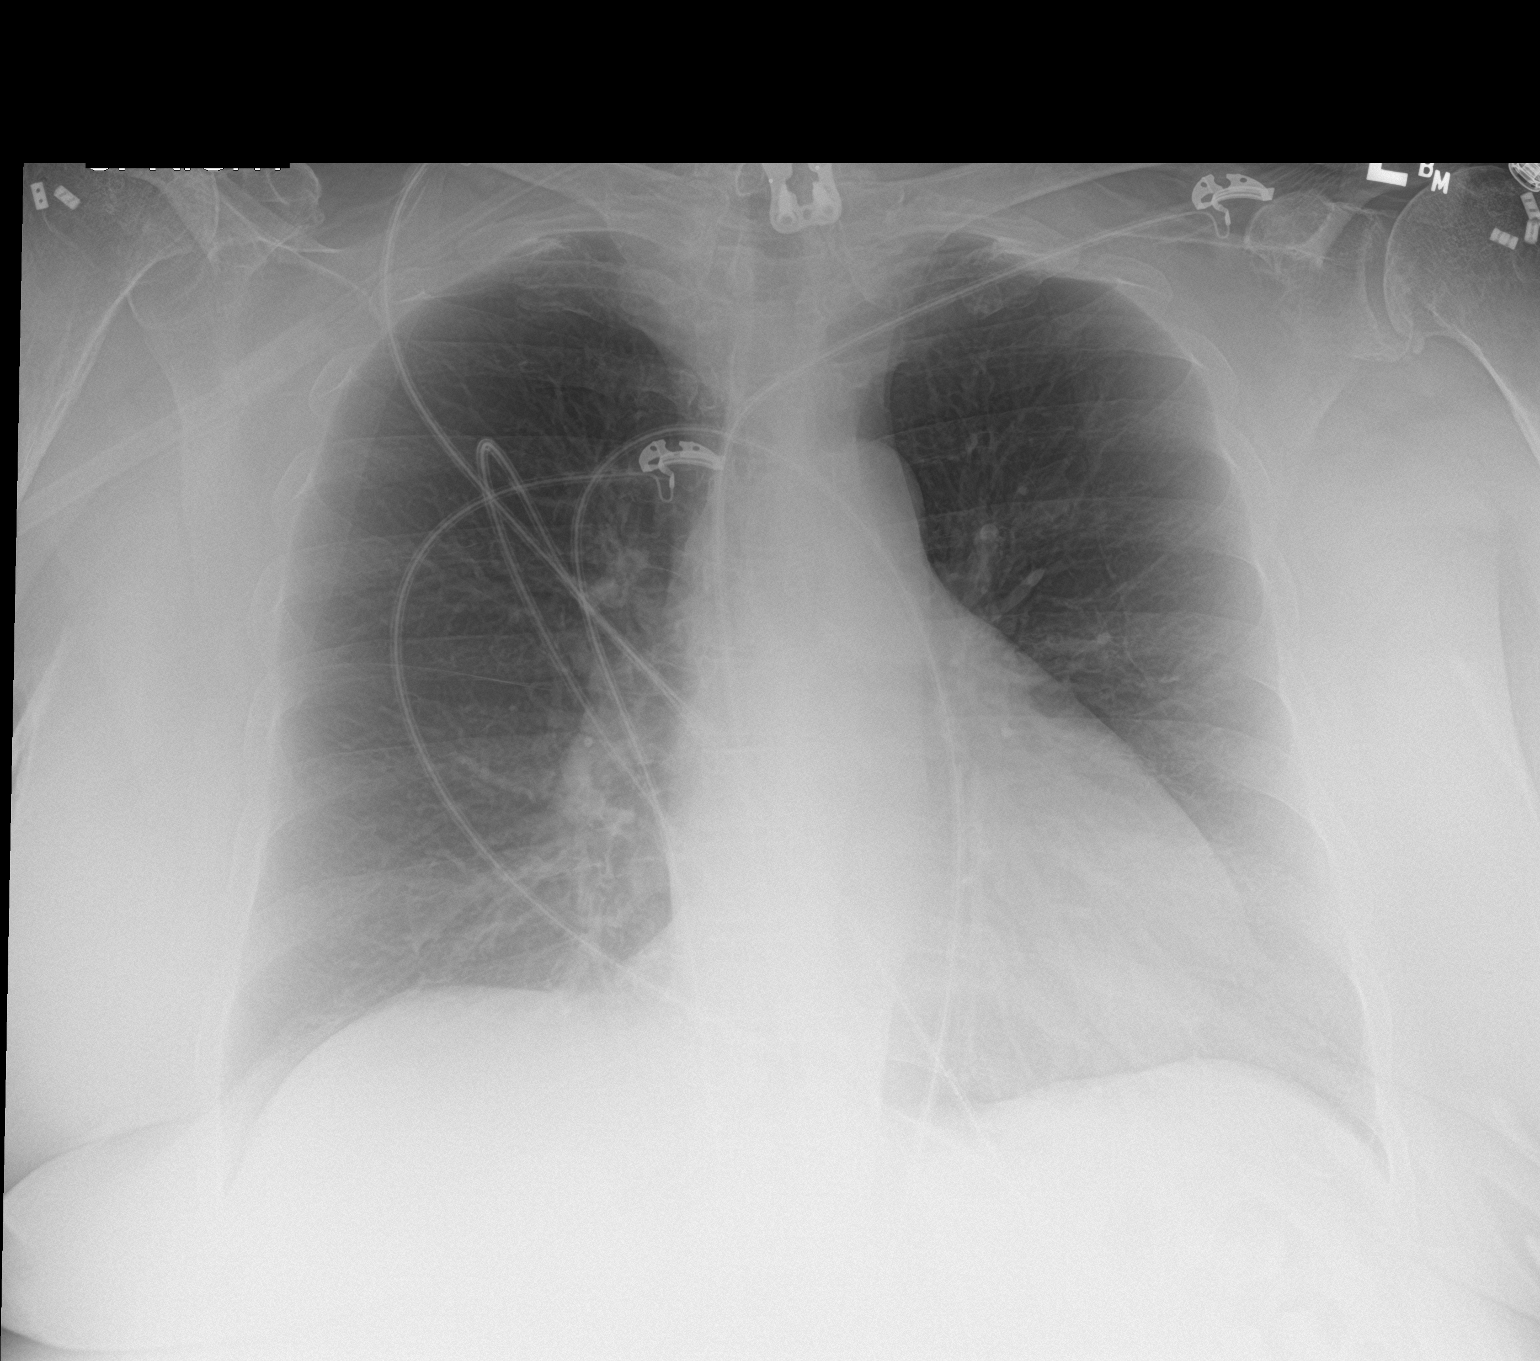

[2 of 2 positions shown; findings below may reference images not displayed]

FINDINGS: Upright AP and lateral views of the chest. Stable mild cardiomegaly
and mediastinal contours. No pneumothorax, pulmonary edema, pleural
effusion or confluent pulmonary opacity. Cervical ACDF hardware
again noted. No acute osseous abnormality identified. Negative
visible bowel gas pattern.
IMPRESSION: No acute cardiopulmonary abnormality.

## 2017-08-31 IMAGING — DX DG ABDOMEN 2V
2 series · 2 of 2 positions shown · non-contrast
Comparison: Bilateral kidney stones unchanged since the CT scan of
2 days ago. No definite ureteral stone.

CLINICAL DATA: History of kidney stones. Right flank pain since
[REDACTED].

EXAM:
ABDOMEN - 2 VIEW

[w abdomen upright]
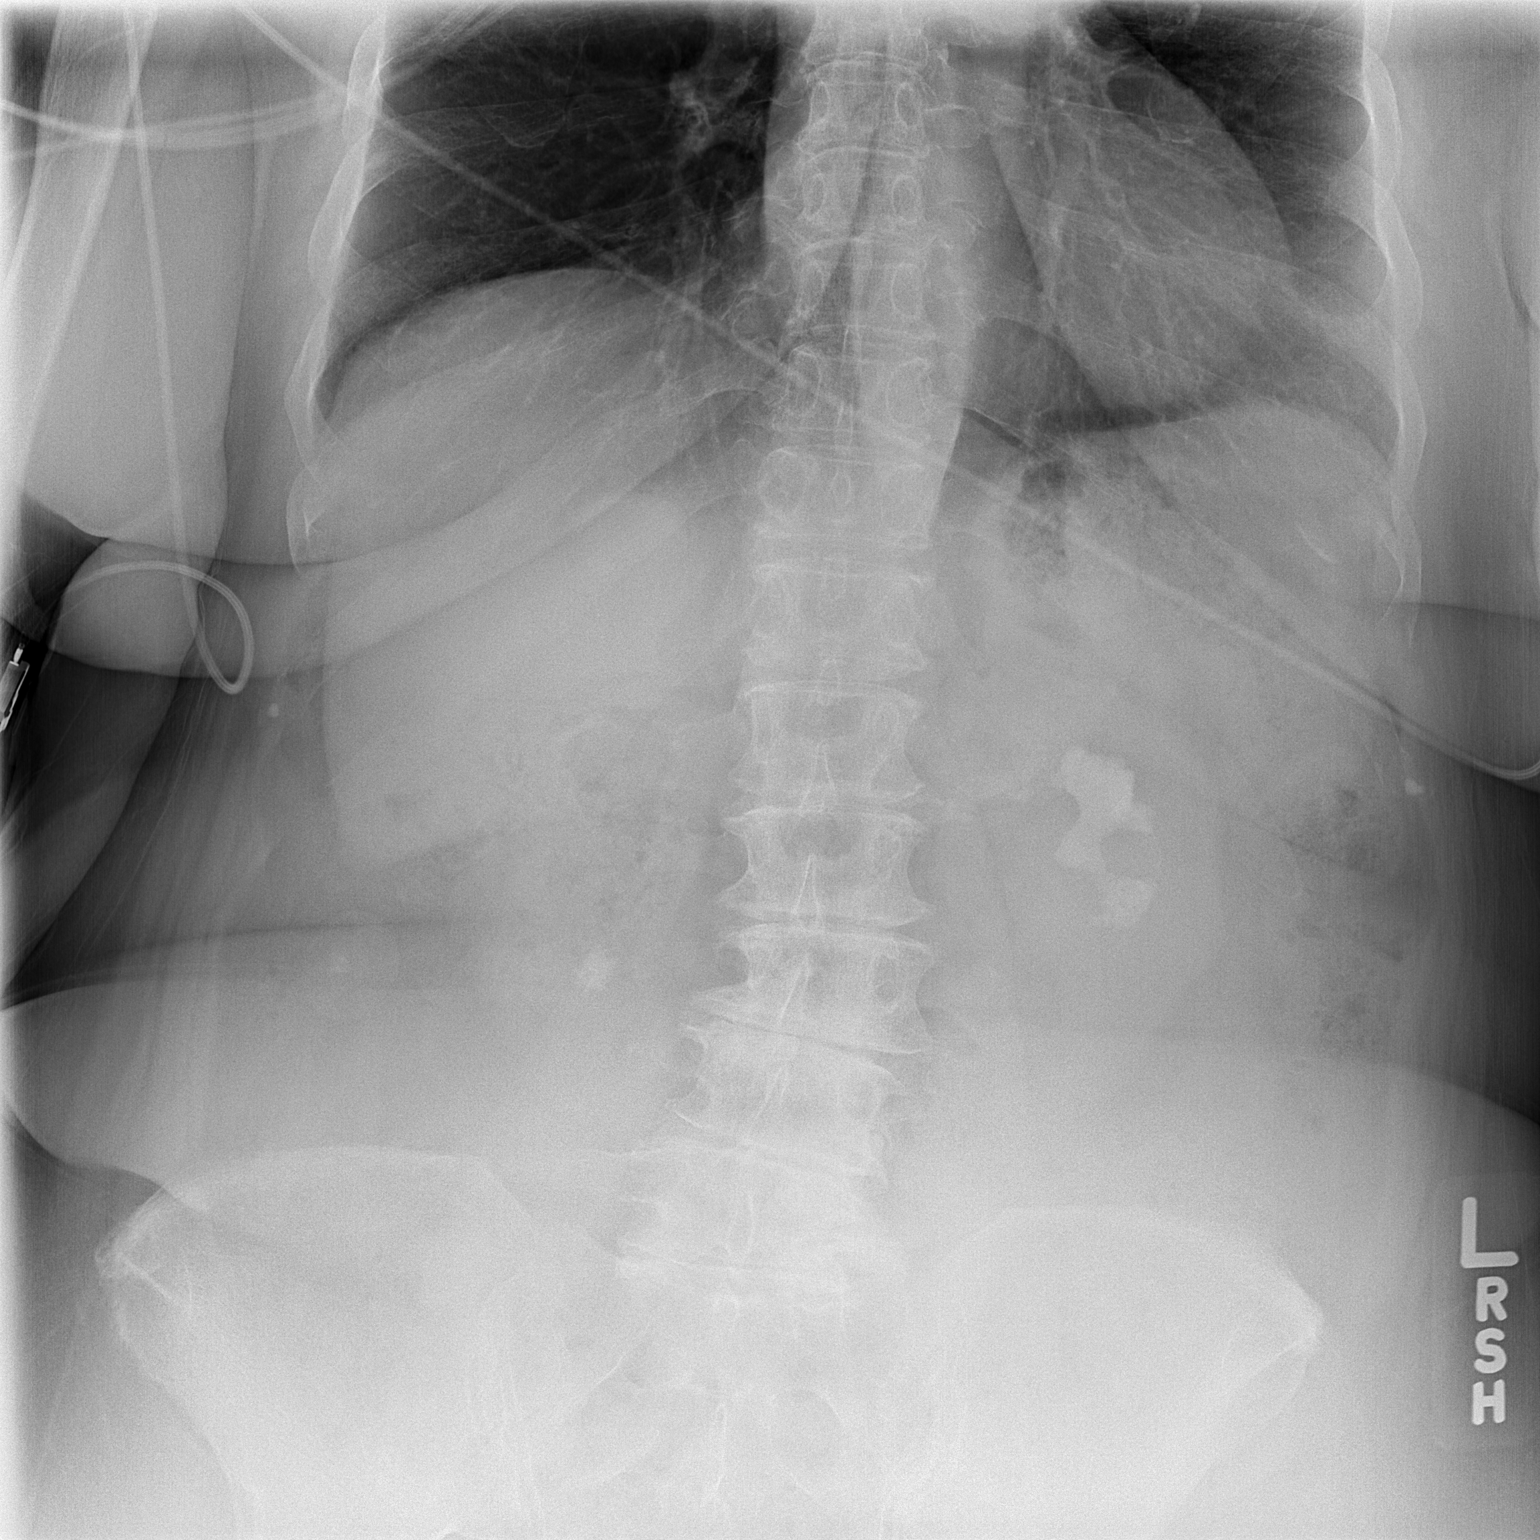

[t abdomen supine]
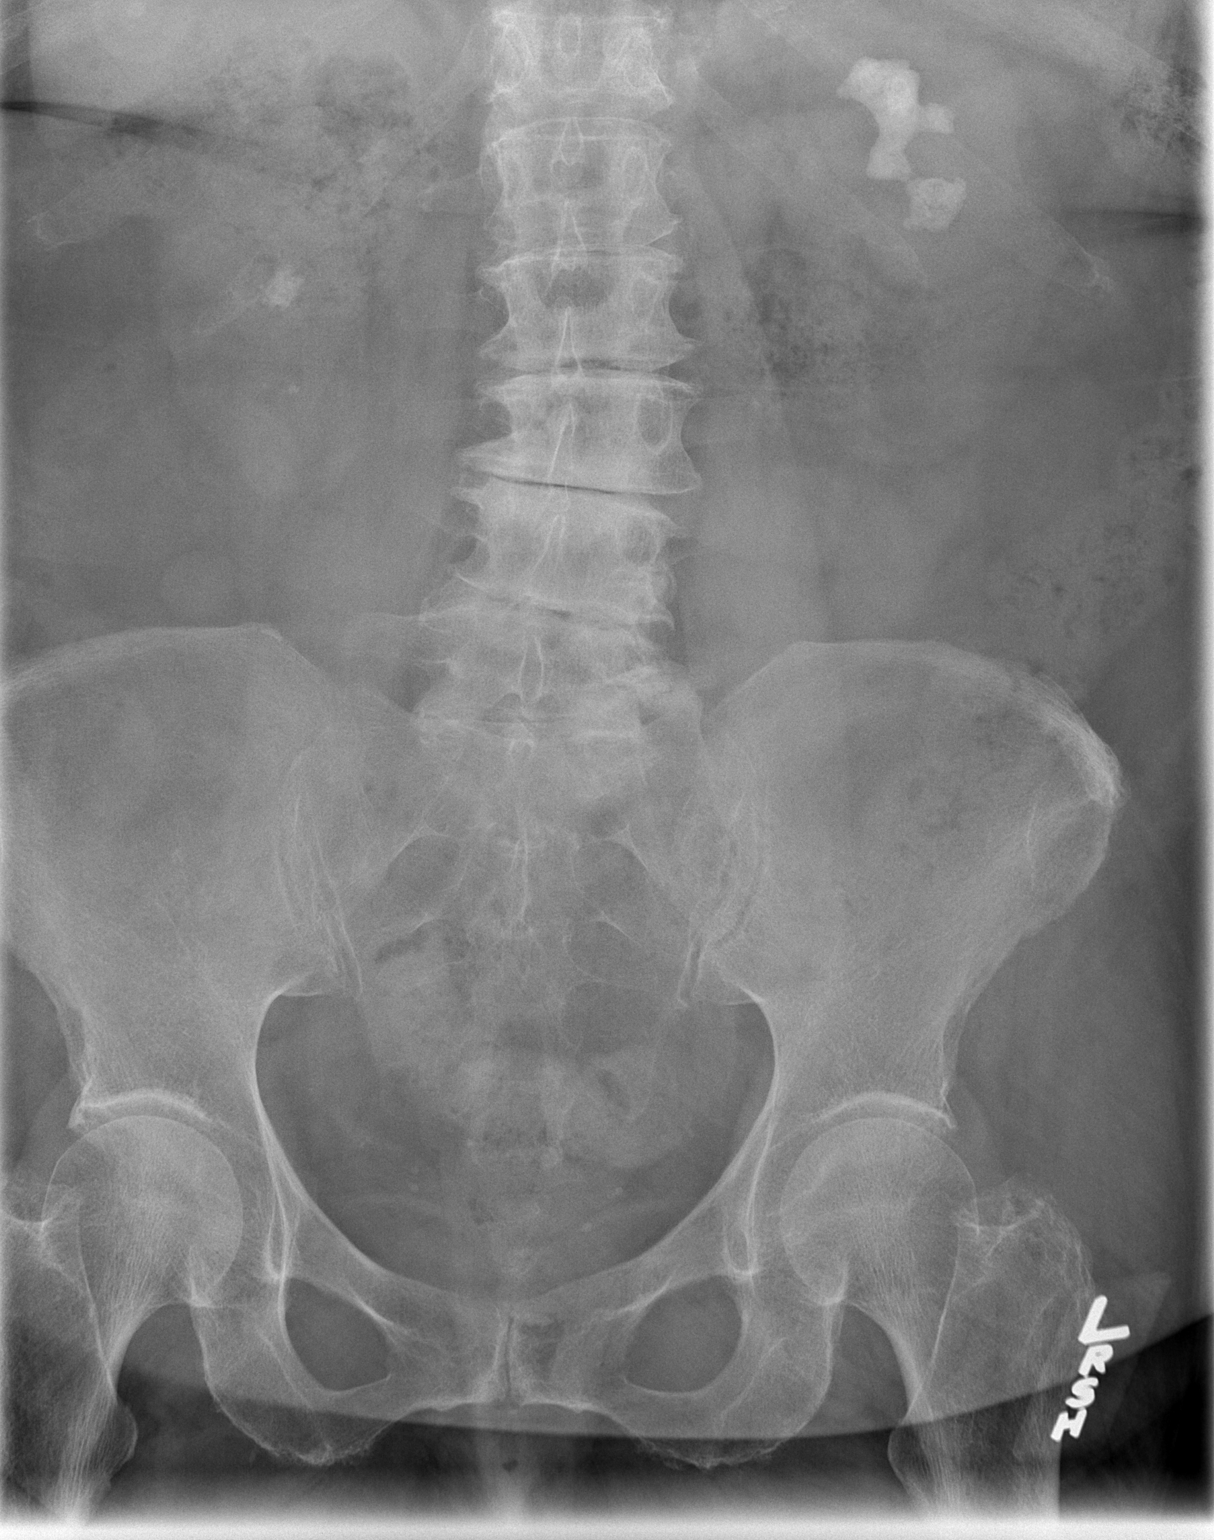

[2 of 2 positions shown; findings below may reference images not displayed]

FINDINGS: There is a coarse staghorn calculus print affecting over the midpole
of the left kidney. On the light at an approximately 12 x 15 mm
stone projects over a mid pole calyx with 2 smaller stones
projecting over lower pole calices. No definite ureteral or bladder
stones. There tiny phleboliths within the pelvis. The bowel gas
pattern is unremarkable. There is moderate multilevel degenerative
disc disease of the mid and lower lumbar spine.
IMPRESSION: Stable bilateral kidney stones including a left-sided staghorn
calculus. No ureteral or bladder stones are observed.

## 2017-09-02 ENCOUNTER — Telehealth: Payer: Self-pay | Admitting: Interventional Cardiology

## 2017-09-02 NOTE — Telephone Encounter (Signed)
New message  Pt verbalized that she is calling for the RN  For information on her surgical clearance  She said that the surgeons office have not heard anything from our office

## 2017-09-02 NOTE — Telephone Encounter (Signed)
Spoke with and let her know that I walked over her cardiac clearance letter next door to [facility]. She voiced understanding.

## 2017-09-07 DIAGNOSIS — M13141 Monoarthritis, not elsewhere classified, right hand: Secondary | ICD-10-CM | POA: Diagnosis not present

## 2017-09-07 DIAGNOSIS — M19041 Primary osteoarthritis, right hand: Secondary | ICD-10-CM | POA: Diagnosis not present

## 2017-09-07 DIAGNOSIS — G5601 Carpal tunnel syndrome, right upper limb: Secondary | ICD-10-CM | POA: Diagnosis not present

## 2017-09-07 DIAGNOSIS — G8918 Other acute postprocedural pain: Secondary | ICD-10-CM | POA: Diagnosis not present

## 2017-09-14 DIAGNOSIS — M79641 Pain in right hand: Secondary | ICD-10-CM | POA: Diagnosis not present

## 2017-09-14 DIAGNOSIS — G5601 Carpal tunnel syndrome, right upper limb: Secondary | ICD-10-CM | POA: Diagnosis not present

## 2017-09-14 DIAGNOSIS — Z4889 Encounter for other specified surgical aftercare: Secondary | ICD-10-CM | POA: Diagnosis not present

## 2017-09-15 DIAGNOSIS — Z6841 Body Mass Index (BMI) 40.0 and over, adult: Secondary | ICD-10-CM | POA: Diagnosis not present

## 2017-09-15 DIAGNOSIS — R030 Elevated blood-pressure reading, without diagnosis of hypertension: Secondary | ICD-10-CM | POA: Diagnosis not present

## 2017-09-15 DIAGNOSIS — M47816 Spondylosis without myelopathy or radiculopathy, lumbar region: Secondary | ICD-10-CM | POA: Diagnosis not present

## 2017-09-15 DIAGNOSIS — M419 Scoliosis, unspecified: Secondary | ICD-10-CM | POA: Diagnosis not present

## 2017-09-16 ENCOUNTER — Telehealth: Payer: Self-pay | Admitting: *Deleted

## 2017-09-16 NOTE — Telephone Encounter (Signed)
Received provider form from patient to complete for patient assistance through [employer] for assistance with [employer]. Dr Tamala Julian is in office today will get him to sign and await for patient to complete her portion.   Signed provider form placed in yellow folder in office.

## 2017-09-17 DIAGNOSIS — E118 Type 2 diabetes mellitus with unspecified complications: Secondary | ICD-10-CM | POA: Diagnosis not present

## 2017-09-17 DIAGNOSIS — I4891 Unspecified atrial fibrillation: Secondary | ICD-10-CM | POA: Diagnosis not present

## 2017-09-20 ENCOUNTER — Other Ambulatory Visit: Payer: Self-pay | Admitting: Neurological Surgery

## 2017-09-20 DIAGNOSIS — M4150 Other secondary scoliosis, site unspecified: Principal | ICD-10-CM

## 2017-09-22 DIAGNOSIS — M1811 Unilateral primary osteoarthritis of first carpometacarpal joint, right hand: Secondary | ICD-10-CM | POA: Diagnosis not present

## 2017-09-22 DIAGNOSIS — G5601 Carpal tunnel syndrome, right upper limb: Secondary | ICD-10-CM | POA: Diagnosis not present

## 2017-09-22 DIAGNOSIS — Z4789 Encounter for other orthopedic aftercare: Secondary | ICD-10-CM | POA: Diagnosis not present

## 2017-09-24 DIAGNOSIS — I4891 Unspecified atrial fibrillation: Secondary | ICD-10-CM | POA: Diagnosis not present

## 2017-09-24 DIAGNOSIS — I10 Essential (primary) hypertension: Secondary | ICD-10-CM | POA: Diagnosis not present

## 2017-09-24 DIAGNOSIS — R739 Hyperglycemia, unspecified: Secondary | ICD-10-CM | POA: Diagnosis not present

## 2017-09-24 DIAGNOSIS — E7800 Pure hypercholesterolemia, unspecified: Secondary | ICD-10-CM | POA: Diagnosis not present

## 2017-09-27 DIAGNOSIS — M79641 Pain in right hand: Secondary | ICD-10-CM | POA: Diagnosis not present

## 2017-10-01 ENCOUNTER — Ambulatory Visit
Admission: RE | Admit: 2017-10-01 | Discharge: 2017-10-01 | Disposition: A | Discharge status: Home / Self Care | Payer: PPO | Source: Ambulatory Visit | Attending: Neurological Surgery | Admitting: Neurological Surgery

## 2017-10-01 DIAGNOSIS — M5126 Other intervertebral disc displacement, lumbar region: Secondary | ICD-10-CM | POA: Diagnosis not present

## 2017-10-01 DIAGNOSIS — M4150 Other secondary scoliosis, site unspecified: Principal | ICD-10-CM

## 2017-10-01 MED ORDER — GADOBENATE DIMEGLUMINE 529 MG/ML IV SOLN
20.0000 mL | Freq: Once | INTRAVENOUS | Status: AC | PRN
  Administered 2017-10-01: 20 mL via INTRAVENOUS

## 2017-10-08 DIAGNOSIS — Z4789 Encounter for other orthopedic aftercare: Secondary | ICD-10-CM | POA: Diagnosis not present

## 2017-10-08 DIAGNOSIS — M1811 Unilateral primary osteoarthritis of first carpometacarpal joint, right hand: Secondary | ICD-10-CM | POA: Diagnosis not present

## 2017-10-08 DIAGNOSIS — G5601 Carpal tunnel syndrome, right upper limb: Secondary | ICD-10-CM | POA: Diagnosis not present

## 2017-10-11 DIAGNOSIS — M25511 Pain in right shoulder: Secondary | ICD-10-CM | POA: Diagnosis not present

## 2017-10-11 DIAGNOSIS — M19111 Post-traumatic osteoarthritis, right shoulder: Secondary | ICD-10-CM | POA: Diagnosis not present

## 2017-10-12 DIAGNOSIS — H5213 Myopia, bilateral: Secondary | ICD-10-CM | POA: Diagnosis not present

## 2017-10-13 DIAGNOSIS — R030 Elevated blood-pressure reading, without diagnosis of hypertension: Secondary | ICD-10-CM | POA: Diagnosis not present

## 2017-10-13 DIAGNOSIS — Z6841 Body Mass Index (BMI) 40.0 and over, adult: Secondary | ICD-10-CM | POA: Diagnosis not present

## 2017-10-13 DIAGNOSIS — Z780 Asymptomatic menopausal state: Secondary | ICD-10-CM | POA: Diagnosis not present

## 2017-10-13 NOTE — Telephone Encounter (Signed)
**Note De-Identified Masa Lubin Obfuscation** Fax received from [employer] stating that they have received the pts Pt assistance application that the pt may have mailed to them as there is no notes in the pts chart indicating that she returned it to the office.  I have faxed Dr Darliss Ridgel part to [facility] with a note on the cover letter asking them to add to the pts application.

## 2017-10-14 ENCOUNTER — Other Ambulatory Visit (HOSPITAL_COMMUNITY): Payer: Self-pay | Admitting: Neurological Surgery

## 2017-10-14 ENCOUNTER — Other Ambulatory Visit: Payer: Self-pay | Admitting: Neurological Surgery

## 2017-10-14 DIAGNOSIS — M4306 Spondylolysis, lumbar region: Secondary | ICD-10-CM

## 2017-10-14 DIAGNOSIS — Z780 Asymptomatic menopausal state: Secondary | ICD-10-CM

## 2017-10-19 DIAGNOSIS — N200 Calculus of kidney: Secondary | ICD-10-CM | POA: Diagnosis not present

## 2017-10-19 DIAGNOSIS — R310 Gross hematuria: Secondary | ICD-10-CM | POA: Diagnosis not present

## 2017-10-25 ENCOUNTER — Ambulatory Visit (HOSPITAL_COMMUNITY)
Admission: RE | Admit: 2017-10-25 | Discharge: 2017-10-25 | Disposition: A | Discharge status: Home / Self Care | Payer: PPO | Source: Ambulatory Visit | Attending: Neurological Surgery | Admitting: Neurological Surgery

## 2017-10-25 ENCOUNTER — Encounter (HOSPITAL_COMMUNITY): Payer: Self-pay

## 2017-10-25 DIAGNOSIS — R29890 Loss of height: Secondary | ICD-10-CM | POA: Insufficient documentation

## 2017-10-25 DIAGNOSIS — M8448XA Pathological fracture, other site, initial encounter for fracture: Secondary | ICD-10-CM | POA: Diagnosis not present

## 2017-10-25 DIAGNOSIS — M4306 Spondylolysis, lumbar region: Secondary | ICD-10-CM

## 2017-10-25 DIAGNOSIS — M47817 Spondylosis without myelopathy or radiculopathy, lumbosacral region: Secondary | ICD-10-CM | POA: Diagnosis not present

## 2017-10-25 DIAGNOSIS — M48061 Spinal stenosis, lumbar region without neurogenic claudication: Secondary | ICD-10-CM | POA: Diagnosis not present

## 2017-10-25 DIAGNOSIS — M5136 Other intervertebral disc degeneration, lumbar region: Secondary | ICD-10-CM | POA: Insufficient documentation

## 2017-10-25 DIAGNOSIS — M47816 Spondylosis without myelopathy or radiculopathy, lumbar region: Secondary | ICD-10-CM | POA: Diagnosis not present

## 2017-10-27 ENCOUNTER — Telehealth: Payer: Self-pay | Admitting: Interventional Cardiology

## 2017-10-27 ENCOUNTER — Other Ambulatory Visit: Payer: Self-pay | Admitting: Urology

## 2017-10-27 NOTE — Telephone Encounter (Signed)
New message       [facility] Pre-operative Risk Assessment    Request for surgical clearance:  1. What type of surgery is being performed? Laser lithotripsy, Stent placement, Urethrostomy  2. When is this surgery scheduled? 11/22/2017  3. What type of clearance is required (medical clearance vs. Pharmacy clearance to hold med vs. Both)? BOTH  4. Are there any medications that need to be held prior to surgery and how long? ELIQUIS  5. Practice name and name of physician performing surgery? DR MARK OTTELIN, [facility]  6. What is your office phone number [phone] EXT [phone]   7.   What is your office fax number [phone] ATTN: CONNIE  8.   Anesthesia type (None, local, MAC, general) ? [place] 10/27/2017, 3:21 PM  _________________________________________________________________   (provider comments below)

## 2017-10-27 NOTE — Telephone Encounter (Signed)
   Primary Cardiologist: Tamala Julian Chart reviewed as part of pre-operative protocol coverage. Because of Selia Wareing Jowett's past medical history and time since last visit, he/she will require a follow-up visit in order to better assess preoperative cardiovascular risk.  Patient has an appointment in May with Dr. Tamala Julian - after the date of her procedure. Last OV note with him having difficulty with shortness of breath/wheezing and weight gain - not seen back as recommended.   Pre-op covering staff: - Please schedule appointment and call patient to inform them. - Please contact requesting surgeon's office via preferred method (i.e, phone, fax) to inform them of need for appointment prior to surgery.  Will also route to pharmacy.   Truitt Merle, NP  10/27/2017, 3:29 PM

## 2017-10-27 NOTE — Telephone Encounter (Signed)
S/w pt stated did not think pt needed clearance stated Dr.Smith will clear her due to clearing pt for February surgery, "just having a little procedure done".  Pt does have appt till after procedure with Dr. Tamala Julian and pt stated to just ask Dr. Tamala Julian if this was ok.  Will send to Dr. Tamala Julian to advise.

## 2017-10-28 NOTE — Telephone Encounter (Signed)
Patient with diagnosis of atrial fibrillation on Eliquis for anticoagulation.    Procedure: laser lithotripsy, stent placement, urethrostomy Date of procedure: 11/22/17  CHADS2-VASc score of  4 (CHF, HTN, AGE,, female)  CrCl 128.7 Platelet count 271  Per office protocol, patient can hold Eliquis for 2 days prior to procedure.    Patient should restart Eliquis at discretion of procedure MD

## 2017-11-02 ENCOUNTER — Telehealth: Payer: Self-pay | Admitting: Interventional Cardiology

## 2017-11-02 NOTE — Telephone Encounter (Signed)
This thread was efaxed to [facility]

## 2017-11-02 NOTE — Telephone Encounter (Signed)
New Message:      Marlowe Kays calling from [facility] to  Inquire about pt's clearance for 11/22/17 to hold Eliquis 3 days prior to procedure.

## 2017-11-02 NOTE — Telephone Encounter (Signed)
This patient has not been seen in our office in over a year. She is now scheduled to see Dr. Tamala Julian on 11/26/2017. Pre-procedure clearance will be addressed at that time. She is currently not cleared.      In the mean time per our pharmacy protocol:   Patient with diagnosis of atrial fibrillation on Eliquis for anticoagulation.    Procedure: laser lithotripsy, stent placement, urethrostomy Date of procedure: 11/22/17  CHADS2-VASc score of  4 (CHF, HTN, AGE,, female)  CrCl 128.7 Platelet count 271  Per office protocol, patient can hold Eliquis for 2 days prior to procedure.    Patient should restart Eliquis at discretion of procedure MD

## 2017-11-03 NOTE — Telephone Encounter (Signed)
New Message:      [name] calling to get clarification from the fax we sent over on yesterday. States pt is cleared but then states pt has an appt on 5/17 for clearance. Pls specify if the pt can have surgery on the 13th and if pt is not cleared [name] would like to know if we can get the pt in for a sooner date so they can still keep their date of surgery for 5/13.

## 2017-11-03 NOTE — Telephone Encounter (Signed)
   Primary Cardiologist:Dr Tamala Julian  Chart reviewed as part of pre-operative protocol coverage. Because of [name]'s past medical history and time since last visit, he/she will require a follow-up visit in order to better assess preoperative cardiovascular risk. This is scheduled for 11/26/17. Her surgery should be postponed till she is seen and cleared by Dr Tamala Julian. This has been routed to [facility] at dr [facility] office   Kerin Ransom, PA-C  11/03/2017, 2:15 PM

## 2017-11-04 NOTE — Telephone Encounter (Signed)
Sent to requesting office

## 2017-11-04 NOTE — Telephone Encounter (Signed)
It is okay for the patient to hold Eliquis and proceed with lithotripsy.  She should hold Eliquis for 48 hours prior to the procedure.  Resume therapy as soon as possible after the procedure based upon instructions from the urologist

## 2017-11-04 NOTE — Telephone Encounter (Signed)
The patient may proceed with lithotripsy.  Hold Eliquis for 48 hours prior to the surgery.  Resume Eliquis as soon as possible after surgery at the instruction of her urologist.

## 2017-11-09 ENCOUNTER — Encounter: Payer: Self-pay | Admitting: Interventional Cardiology

## 2017-11-10 DIAGNOSIS — M79641 Pain in right hand: Secondary | ICD-10-CM | POA: Diagnosis not present

## 2017-11-10 DIAGNOSIS — M1811 Unilateral primary osteoarthritis of first carpometacarpal joint, right hand: Secondary | ICD-10-CM | POA: Diagnosis not present

## 2017-11-11 ENCOUNTER — Other Ambulatory Visit: Payer: Self-pay | Admitting: Neurological Surgery

## 2017-11-11 DIAGNOSIS — M8580 Other specified disorders of bone density and structure, unspecified site: Secondary | ICD-10-CM

## 2017-11-12 ENCOUNTER — Ambulatory Visit
Admission: RE | Admit: 2017-11-12 | Discharge: 2017-11-12 | Disposition: A | Discharge status: Home / Self Care | Payer: PPO | Source: Ambulatory Visit | Attending: Neurological Surgery | Admitting: Neurological Surgery

## 2017-11-12 DIAGNOSIS — Z780 Asymptomatic menopausal state: Secondary | ICD-10-CM | POA: Diagnosis not present

## 2017-11-12 DIAGNOSIS — M810 Age-related osteoporosis without current pathological fracture: Secondary | ICD-10-CM | POA: Diagnosis not present

## 2017-11-12 DIAGNOSIS — M8580 Other specified disorders of bone density and structure, unspecified site: Secondary | ICD-10-CM

## 2017-11-12 DIAGNOSIS — M85832 Other specified disorders of bone density and structure, left forearm: Secondary | ICD-10-CM | POA: Diagnosis not present

## 2017-11-15 ENCOUNTER — Other Ambulatory Visit: Payer: Self-pay

## 2017-11-15 ENCOUNTER — Encounter (HOSPITAL_BASED_OUTPATIENT_CLINIC_OR_DEPARTMENT_OTHER): Payer: Self-pay

## 2017-11-15 DIAGNOSIS — R319 Hematuria, unspecified: Secondary | ICD-10-CM

## 2017-11-15 HISTORY — DX: Hematuria, unspecified: R31.9

## 2017-11-15 NOTE — Progress Notes (Signed)
Spoke with:  "[name]" NPO:  After Midnight, no gum, candy, or mints   Arrival time:  0530AM Labs: (Difficult IV stick)  Istat4, EKG AM medications:  Cartia Pre op orders: Yes Ride home:  [name] (husband) [phone]

## 2017-11-17 DIAGNOSIS — M48061 Spinal stenosis, lumbar region without neurogenic claudication: Secondary | ICD-10-CM | POA: Diagnosis not present

## 2017-11-17 DIAGNOSIS — M47816 Spondylosis without myelopathy or radiculopathy, lumbar region: Secondary | ICD-10-CM | POA: Diagnosis not present

## 2017-11-17 DIAGNOSIS — Z6841 Body Mass Index (BMI) 40.0 and over, adult: Secondary | ICD-10-CM | POA: Diagnosis not present

## 2017-11-17 DIAGNOSIS — M542 Cervicalgia: Secondary | ICD-10-CM | POA: Diagnosis not present

## 2017-11-21 NOTE — Anesthesia Preprocedure Evaluation (Signed)
Anesthesia Evaluation  Patient identified by MRN, date of birth, ID band Patient awake    Reviewed: Allergy & Precautions, H&P , NPO status , Patient's Chart, lab work & pertinent test results  History of Anesthesia Complications (+) PONV and history of anesthetic complications  Airway Mallampati: II  TM Distance: >3 FB Neck ROM: full    Dental no notable dental hx. (+) Teeth Intact, Dental Advisory Given   Pulmonary neg pulmonary ROS, shortness of breath,    Pulmonary exam normal breath sounds clear to auscultation       Cardiovascular Exercise Tolerance: Good hypertension, Normal cardiovascular exam+ dysrhythmias Atrial Fibrillation  Rhythm:regular Rate:Normal     Neuro/Psych PSYCHIATRIC DISORDERS Anxiety negative neurological ROS     GI/Hepatic negative GI ROS, Neg liver ROS,   Endo/Other  Morbid obesity  Renal/GU Renal diseasenegative Renal ROS  negative genitourinary   Musculoskeletal  (+) Arthritis ,   Abdominal (+) + obese,   Peds  Hematology negative hematology ROS (+)   Anesthesia Other Findings   Reproductive/Obstetrics negative OB ROS                             Anesthesia Physical  Anesthesia Plan  ASA: III  Anesthesia Plan: General   Post-op Pain Management:    Induction: Intravenous  PONV Risk Score and Plan: 4 or greater and Dexamethasone, Ondansetron and Treatment may vary due to age or medical condition  Airway Management Planned: LMA  Additional Equipment:   Intra-op Plan:   Post-operative Plan: Extubation in OR  Informed Consent: I have reviewed the patients History and Physical, chart, labs and discussed the procedure including the risks, benefits and alternatives for the proposed anesthesia with the patient or authorized representative who has indicated his/her understanding and acceptance.   Dental Advisory Given  Plan Discussed with:  CRNA  Anesthesia Plan Comments:         Anesthesia Quick Evaluation

## 2017-11-22 ENCOUNTER — Other Ambulatory Visit: Payer: Self-pay

## 2017-11-22 ENCOUNTER — Ambulatory Visit (HOSPITAL_BASED_OUTPATIENT_CLINIC_OR_DEPARTMENT_OTHER): Payer: PPO | Admitting: Anesthesiology

## 2017-11-22 ENCOUNTER — Encounter (HOSPITAL_BASED_OUTPATIENT_CLINIC_OR_DEPARTMENT_OTHER): Payer: Self-pay | Admitting: *Deleted

## 2017-11-22 ENCOUNTER — Ambulatory Visit (HOSPITAL_BASED_OUTPATIENT_CLINIC_OR_DEPARTMENT_OTHER)
Admission: RE | Admit: 2017-11-22 | Discharge: 2017-11-22 | Disposition: A | Discharge status: Home / Self Care | Payer: PPO | Source: Ambulatory Visit | Attending: Urology | Admitting: Urology

## 2017-11-22 ENCOUNTER — Encounter (HOSPITAL_BASED_OUTPATIENT_CLINIC_OR_DEPARTMENT_OTHER)
Admission: RE | Disposition: A | Discharge status: Home / Self Care | Payer: Self-pay | Source: Ambulatory Visit | Attending: Urology

## 2017-11-22 DIAGNOSIS — I482 Chronic atrial fibrillation: Secondary | ICD-10-CM | POA: Diagnosis not present

## 2017-11-22 DIAGNOSIS — Z6841 Body Mass Index (BMI) 40.0 and over, adult: Secondary | ICD-10-CM | POA: Diagnosis not present

## 2017-11-22 DIAGNOSIS — I110 Hypertensive heart disease with heart failure: Secondary | ICD-10-CM | POA: Diagnosis not present

## 2017-11-22 DIAGNOSIS — N200 Calculus of kidney: Secondary | ICD-10-CM

## 2017-11-22 DIAGNOSIS — Z7901 Long term (current) use of anticoagulants: Secondary | ICD-10-CM | POA: Insufficient documentation

## 2017-11-22 DIAGNOSIS — Z79899 Other long term (current) drug therapy: Secondary | ICD-10-CM | POA: Diagnosis not present

## 2017-11-22 DIAGNOSIS — I5033 Acute on chronic diastolic (congestive) heart failure: Secondary | ICD-10-CM | POA: Diagnosis not present

## 2017-11-22 DIAGNOSIS — Z791 Long term (current) use of non-steroidal anti-inflammatories (NSAID): Secondary | ICD-10-CM | POA: Diagnosis not present

## 2017-11-22 DIAGNOSIS — I4891 Unspecified atrial fibrillation: Secondary | ICD-10-CM | POA: Diagnosis not present

## 2017-11-22 DIAGNOSIS — I10 Essential (primary) hypertension: Secondary | ICD-10-CM | POA: Diagnosis not present

## 2017-11-22 HISTORY — DX: Prediabetes: R73.03

## 2017-11-22 HISTORY — DX: Personal history of (healed) traumatic fracture: Z87.81

## 2017-11-22 HISTORY — DX: Unspecified rotator cuff tear or rupture of left shoulder, not specified as traumatic: M75.102

## 2017-11-22 HISTORY — DX: Chronic atrial fibrillation, unspecified: I48.20

## 2017-11-22 HISTORY — DX: Chronic diastolic (congestive) heart failure: I50.32

## 2017-11-22 HISTORY — DX: Obesity, unspecified: E66.9

## 2017-11-22 HISTORY — DX: Dyspnea, unspecified: R06.00

## 2017-11-22 HISTORY — DX: Unspecified rotator cuff tear or rupture of right shoulder, not specified as traumatic: M75.101

## 2017-11-22 HISTORY — DX: Acute respiratory failure with hypoxia: J96.01

## 2017-11-22 HISTORY — DX: Hematuria, unspecified: R31.9

## 2017-11-22 HISTORY — PX: CYSTOSCOPY/URETEROSCOPY/HOLMIUM LASER/STENT PLACEMENT: SHX6546

## 2017-11-22 LAB — POCT I-STAT 4, (NA,K, GLUC, HGB,HCT)
Glucose, Bld: 129 mg/dL — ABNORMAL HIGH (ref 65–99)
HCT: 33 % — ABNORMAL LOW (ref 36.0–46.0)
Hemoglobin: 11.2 g/dL — ABNORMAL LOW (ref 12.0–15.0)
Potassium: 3.7 mmol/L (ref 3.5–5.1)
Sodium: 143 mmol/L (ref 135–145)

## 2017-11-22 SURGERY — CYSTOSCOPY/URETEROSCOPY/HOLMIUM LASER/STENT PLACEMENT
Anesthesia: General | Laterality: Right

## 2017-11-22 MED ORDER — DEXAMETHASONE SODIUM PHOSPHATE 10 MG/ML IJ SOLN
INTRAMUSCULAR | Status: AC
Start: 2017-11-22 — End: ?
  Filled 2017-11-22: qty 1

## 2017-11-22 MED ORDER — ONDANSETRON HCL 4 MG/2ML IJ SOLN
INTRAMUSCULAR | Status: DC | PRN
  Administered 2017-11-22: 4 mg via INTRAVENOUS

## 2017-11-22 MED ORDER — IOHEXOL 300 MG/ML  SOLN
INTRAMUSCULAR | Status: DC | PRN
  Administered 2017-11-22: 21 mL via URETHRAL

## 2017-11-22 MED ORDER — FENTANYL CITRATE (PF) 100 MCG/2ML IJ SOLN
INTRAMUSCULAR | Status: DC | PRN
  Administered 2017-11-22: 12.5 ug via INTRAVENOUS
  Administered 2017-11-22: 25 ug via INTRAVENOUS
  Administered 2017-11-22: 12.5 ug via INTRAVENOUS
  Administered 2017-11-22: 25 ug via INTRAVENOUS
  Administered 2017-11-22: 12.5 ug via INTRAVENOUS
  Administered 2017-11-22: 25 ug via INTRAVENOUS
  Administered 2017-11-22: 12.5 ug via INTRAVENOUS
  Administered 2017-11-22 (×3): 25 ug via INTRAVENOUS

## 2017-11-22 MED ORDER — PROPOFOL 10 MG/ML IV BOLUS
INTRAVENOUS | Status: AC
  Filled 2017-11-22: qty 40

## 2017-11-22 MED ORDER — DEXAMETHASONE SODIUM PHOSPHATE 4 MG/ML IJ SOLN
INTRAMUSCULAR | Status: DC | PRN
  Administered 2017-11-22: 10 mg via INTRAVENOUS

## 2017-11-22 MED ORDER — PROPOFOL 10 MG/ML IV BOLUS
INTRAVENOUS | Status: DC | PRN
  Administered 2017-11-22: 150 mg via INTRAVENOUS

## 2017-11-22 MED ORDER — KETOROLAC TROMETHAMINE 30 MG/ML IJ SOLN
INTRAMUSCULAR | Status: DC | PRN
  Administered 2017-11-22: 30 mg via INTRAVENOUS

## 2017-11-22 MED ORDER — PHENAZOPYRIDINE HCL 100 MG PO TABS
ORAL_TABLET | ORAL | Status: AC
  Filled 2017-11-22: qty 2

## 2017-11-22 MED ORDER — SODIUM CHLORIDE 0.9 % IR SOLN
Status: DC | PRN
  Administered 2017-11-22: 6000 mL via INTRAVESICAL

## 2017-11-22 MED ORDER — FENTANYL CITRATE (PF) 100 MCG/2ML IJ SOLN
INTRAMUSCULAR | Status: AC
  Filled 2017-11-22: qty 2

## 2017-11-22 MED ORDER — CIPROFLOXACIN IN D5W 400 MG/200ML IV SOLN
400.0000 mg | Freq: Once | INTRAVENOUS | Status: AC
  Administered 2017-11-22: 400 mg via INTRAVENOUS
  Filled 2017-11-22: qty 200

## 2017-11-22 MED ORDER — LACTATED RINGERS IV SOLN
INTRAVENOUS | Status: DC
  Administered 2017-11-22: 06:00:00 via INTRAVENOUS
  Filled 2017-11-22: qty 1000

## 2017-11-22 MED ORDER — FENTANYL CITRATE (PF) 100 MCG/2ML IJ SOLN
25.0000 ug | INTRAMUSCULAR | Status: DC | PRN
  Filled 2017-11-22: qty 1

## 2017-11-22 MED ORDER — LIDOCAINE HCL (CARDIAC) PF 100 MG/5ML IV SOSY
PREFILLED_SYRINGE | INTRAVENOUS | Status: DC | PRN
  Administered 2017-11-22: 40 mg via INTRAVENOUS

## 2017-11-22 MED ORDER — PROMETHAZINE HCL 25 MG/ML IJ SOLN
6.2500 mg | INTRAMUSCULAR | Status: DC | PRN
  Filled 2017-11-22: qty 1

## 2017-11-22 MED ORDER — ONDANSETRON HCL 4 MG/2ML IJ SOLN
INTRAMUSCULAR | Status: AC
  Filled 2017-11-22: qty 2

## 2017-11-22 MED ORDER — PHENAZOPYRIDINE HCL 200 MG PO TABS: 200.0000 mg | ORAL_TABLET | Freq: Three times a day (TID) | ORAL | 0 refills | Status: DC | PRN

## 2017-11-22 MED ORDER — CIPROFLOXACIN IN D5W 400 MG/200ML IV SOLN
INTRAVENOUS | Status: AC
  Filled 2017-11-22: qty 200

## 2017-11-22 MED ORDER — HYDROCODONE-ACETAMINOPHEN 10-325 MG PO TABS
1.0000 | ORAL_TABLET | ORAL | 0 refills | Status: DC | PRN
Start: 2017-11-22 — End: 2018-04-21

## 2017-11-22 MED ORDER — MEPERIDINE HCL 25 MG/ML IJ SOLN
6.2500 mg | INTRAMUSCULAR | Status: DC | PRN
  Filled 2017-11-22: qty 1

## 2017-11-22 MED ORDER — LIDOCAINE 2% (20 MG/ML) 5 ML SYRINGE
INTRAMUSCULAR | Status: AC
  Filled 2017-11-22: qty 5

## 2017-11-22 MED ORDER — PHENAZOPYRIDINE HCL 200 MG PO TABS
200.0000 mg | ORAL_TABLET | Freq: Three times a day (TID) | ORAL | Status: DC
  Administered 2017-11-22: 200 mg via ORAL
  Filled 2017-11-22: qty 1

## 2017-11-22 MED ORDER — 0.9 % SODIUM CHLORIDE (POUR BTL) OPTIME
TOPICAL | Status: DC | PRN
  Administered 2017-11-22: 1000 mL

## 2017-11-22 SURGICAL SUPPLY — 26 items
BAG DRAIN URO-CYSTO SKYTR STRL (DRAIN) ×2 IMPLANT
BASKET ZERO TIP NITINOL 2.4FR (BASKET) ×2 IMPLANT
BLADE 10 SAFETY STRL DISP (BLADE) ×2 IMPLANT
CATH INTERMIT  6FR 70CM (CATHETERS) IMPLANT
CATH URET 5FR 28IN CONE TIP (BALLOONS)
CATH URET 5FR 70CM CONE TIP (BALLOONS) IMPLANT
CLOTH BEACON ORANGE TIMEOUT ST (SAFETY) ×2 IMPLANT
EXTRACTOR STONE 1.7FRX115CM (UROLOGICAL SUPPLIES) IMPLANT
FIBER LASER FLEXIVA 365 (UROLOGICAL SUPPLIES) IMPLANT
FIBER LASER TRAC TIP (UROLOGICAL SUPPLIES) ×2 IMPLANT
GLOVE BIO SURGEON STRL SZ8 (GLOVE) ×2 IMPLANT
GOWN STRL REUS W/TWL XL LVL3 (GOWN DISPOSABLE) ×2 IMPLANT
GUIDEWIRE 0.038 PTFE COATED (WIRE) IMPLANT
GUIDEWIRE ANG ZIPWIRE 038X150 (WIRE) IMPLANT
GUIDEWIRE STR DUAL SENSOR (WIRE) ×2 IMPLANT
INFUSOR MANOMETER BAG 3000ML (MISCELLANEOUS) IMPLANT
IV NS IRRIG 3000ML ARTHROMATIC (IV SOLUTION) IMPLANT
KIT TURNOVER CYSTO (KITS) ×2 IMPLANT
MANIFOLD NEPTUNE II (INSTRUMENTS) ×2 IMPLANT
NS IRRIG 500ML POUR BTL (IV SOLUTION) IMPLANT
PACK CYSTO (CUSTOM PROCEDURE TRAY) ×2 IMPLANT
SHEATH URETERAL 12FRX35CM (MISCELLANEOUS) ×2 IMPLANT
STENT URET 6FRX24 CONTOUR (STENTS) ×2 IMPLANT
TUBE CONNECTING 12X1/4 (SUCTIONS) ×2 IMPLANT
TUBING UROLOGY SET (TUBING) ×2 IMPLANT
WATER STERILE IRR 3000ML UROMA (IV SOLUTION) IMPLANT

## 2017-11-22 NOTE — H&P (Signed)
HPI: [name] is a 72 year-old female with right renal calculi.  The problem is on both sides. This is not her first kidney stone. She is currently having back pain and nausea. She denies having flank pain, groin pain, vomiting, fever, and chills. She has not caught a stone in her urine strainer since her symptoms began.   She has had eswl and ureteroscopy for treatment of her stones in the past.   08/13/17: Patient with prior history of bilateral renal calculi. She presents today with complaints of gross hematuria and lower back pain. She states that hematuria started on Sunday, but seems to have improved somewhat today. She denies passing any clots. She is on Elquis for A. Fib. She states that she has had right lower back pain, but she did have some radiate to her RLQ on Thursday evening. She does have some increased frequency. She denies dysuria or fevers. She did have some intermittent nausea, as well. No vomiting.   10/19/17: She has returned today. She continues to have intermittent gross hematuria that last for about 2 or 3 days at a time. It is associated at times with some mild right flank discomfort. She is currently not having any symptoms. She reports she is scheduled to possibly undergo a 10 hr surgery by her neurosurgeon sometime in the future.     ALLERGIES: Band Aid - Other Reaction, Skin blisters Sulfa Drugs - Other Reaction, UNKNOWN    MEDICATIONS: Cardizem  Eliquis  Montelukast Sodium  Oxycodone-Acetaminophen 5 mg-325 mg tablet 1 tablet PO Q 6 H PRN  Voltaren     GU PSH: Cysto Uretero Lithotripsy - 2014 ESWL - 2013, 2010, 2010    NON-GU PSH: Back Surgery (Unspecified) Cholecystectomy (open) - 2010 Hand/finger Surgery Revise Knee Joint - 2013 Rotator cuff surgery    GU PMH: Gross hematuria - 08/13/2017 Renal calculus - 08/13/2017, (Stable), I discussed with her the fact that she has known renal calculi that are not causing obstruction or pain. Her multiple imaging  studies in the hospital revealed no loss of renal parenchyma, no increase in size of the stones when compared with previous imaging studies, no evidence of obstruction and in addition she has normal renal function. We have been following these and will continue to do so as she would not be a good surgical candidate as we have discussed in the past., - 10/29/2016, Staghorn calculus, - 2017, Renal calculus, bilateral, - 2017 Flank Pain (Worsening, Chronic), Right, Culture urine. No ABX required at this time unless culture proven UTi. Ketorolac 30 mg IM today. No acute stone noted along right ureter. Stable bilateral renal stones. Will refill Oxycodone at this time and she understands if pain persists or worsens will need CT urogram. - 06/17/2017 Mixed incontinence, Urge and stress incontinence - 2014 Ureteral calculus, Proximal Ureteral Stone On The Right - 2014      PMH Notes: She has a history of medullary sponge kidney and kidney stones and has undergone lithotripsy twice in the past for stones on the right-hand side. She has also required ureteroscopy for residual fragments after her first lithotripsy. On a CT scan done in 1/10 she had no right renal calculi and a 2.4 x 1.2 cm stone in the upper portion of her right kidney without evidence of obstruction. Hounsfield units were ~800-900. A KUB in 9/12 revealed the stone had increased in size to 3 x 3 cm.  She has a partial staghorn calculus in the upper pole of the  left kidney that has remained unchanged and does not appear to be struvite.  Right UPJ stone treated with ESL in 11/13  24 hour urine: She was found to have no significant abnormality.  Stone analysis: calcium oxalate and calcium phosphate.    Mixed urinary incontinence: She has mild incontinence with coughing and sneezing but occasionally will also have significant urgency and what sounds like urge incontinence with associated nocturia 2-3 times     NON-GU PMH: Encounter for general adult  medical examination without abnormal findings, Encounter for preventive health examination - 2017 Anxiety, Anxiety - 2014 Medullary cystic kidney, Congenital medullary sponge kidney - 2014 Atrial Fibrillation    FAMILY HISTORY: Death In The Family Father - Father Death In The Family Mother - Father Diabetes - Mother Family Health Status Number - Father Heart Attack - Brother Heart Disease - Mother Lung Cancer - Mother, Father   SOCIAL HISTORY: Marital Status: Married Preferred Language: English; Ethnicity: Not Hispanic Or Latino; Race: White Current Smoking Status: Patient has never smoked.   Tobacco Use Assessment Completed: Used Tobacco in last 30 days? Does not use smokeless tobacco. Has never drank.  Does not use drugs. Drinks 2 caffeinated drinks per day.    REVIEW OF SYSTEMS:    GU Review Female:   Patient denies frequent urination, hard to postpone urination, burning /pain with urination, get up at night to urinate, leakage of urine, stream starts and stops, trouble starting your stream, have to strain to urinate, and being pregnant.  Gastrointestinal (Upper):   Patient denies nausea, vomiting, and indigestion/ heartburn.  Gastrointestinal (Lower):   Patient denies diarrhea and constipation.  Constitutional:   Patient denies fever, night sweats, weight loss, and fatigue.  Skin:   Patient denies skin rash/ lesion and itching.  Eyes:   Patient denies blurred vision and double vision.  Ears/ Nose/ Throat:   Patient denies sore throat and sinus problems.  Hematologic/Lymphatic:   Patient denies swollen glands and easy bruising.  Cardiovascular:   Patient denies leg swelling and chest pains.  Respiratory:   Patient denies cough and shortness of breath.  Endocrine:   Patient denies excessive thirst.  Musculoskeletal:   Patient denies back pain and joint pain.  Neurological:   Patient denies headaches and dizziness.  Psychologic:   Patient denies depression and anxiety.    VITAL SIGNS:    Weight 235 lb / 106.59 kg  Height 62 in / 157.48 cm  BP 140/68 mmHg  Pulse 72 /min  BMI 43.0 kg/m   MULTI-SYSTEM PHYSICAL EXAMINATION:    Constitutional: Well-nourished. No physical deformities. Normally developed. Good grooming.  Neck: Neck symmetrical, not swollen. Normal tracheal position.  Respiratory: No labored breathing, no use of accessory muscles. Normal breath sounds.  Cardiovascular: Normal temperature, normal extremity pulses, no swelling, no varicosities.   Lymphatic: No enlargement of neck, axillae, groin.  Skin: No paleness, no jaundice, no cyanosis. No lesion, no ulcer, no rash.  Neurologic / Psychiatric: Oriented to time, oriented to place, oriented to person. No depression, no anxiety, no agitation.  Gastrointestinal: No mass, no tenderness, no rigidity, non obese abdomen.  Eyes: Normal conjunctivae. Normal eyelids.  Ears, Nose, Mouth, and Throat: Left ear no scars, no lesions, no masses. Right ear no scars, no lesions, no masses. Nose no scars, no lesions, no masses. Normal hearing. Normal lips.  Musculoskeletal: Normal gait and station of head and neck.     PAST DATA REVIEWED:  Source Of History:  Patient  Lab Test  Review:   BUN/Creatinine  Records Review:   Previous Patient Records, POC Tool  Urine Test Review:   Urine Culture  X-Ray Review: KUB: Reviewed Films. Previous KUB images were reviewed and compared with today's study. MRI Lumbar Spine: Reviewed Films. Reviewed Report. She had an MRI scan of the lumbar spine on 10/01/17 which revealed no right or left hydronephrosis   Notes:                     Urine cultures done in 12/18 and 2/19 were negative.  She has excellent renal function with a creatinine of 0.67 on 09/17/17.   PROCEDURES:         KUB - [id number]  A single view of the abdomen is obtained.      Her left staghorn calculus remains unchanged. The right renal calculus in the mid to lower pole of her right kidney is unchanged and the  previously noted stone at the UPJ is now located in the lower pole. She has not developed any new calculi.         Urinalysis Dipstick Dipstick Cont'd Micro  Color: Amber Bilirubin: Neg WBC/hpf: 6 - 10/hpf  Appearance: Cloudy Ketones: Neg RBC/hpf: >60/hpf  Specific Gravity: 1.025 Blood: 3+ Bacteria: Rare (0-9/hpf)  pH: 6.0 Protein: 1+ Cystals: NS (Not Seen)  Glucose: Neg Urobilinogen: 0.2 Casts: NS (Not Seen)    Nitrites: Neg Trichomonas: Not Present    Leukocyte Esterase: Trace Mucous: Not Present      Epithelial Cells: 0 - 5/hpf      Yeast: NS (Not Seen)      Sperm: Not Present    ASSESSMENT/PLAN:     ICD-10 Details  1 GU:   Renal calculus - N20.0 Stable - She will be scheduled for right ureteroscopy and laser lithotripsy of her right renal calculi.  2   Gross hematuria - R31.0 Stable - Her stones are undoubtedly causing her intermittent gross hematuria so I am going to have her stop her Eliquis 3 days prior to the procedure and address the stones.          Notes:   She has a stable right mid pole calculus and a lower pole stone that has shown movement and is most likely causing her he intermittent hematuria. We have discussed the options for management. Because of the stones being in different locations I did not recommend lithotripsy. She has had this in the past. She also has undergone ureteroscopy which I have recommended and will address both of the stones within her right kidney.

## 2017-11-22 NOTE — Transfer of Care (Signed)
Immediate Anesthesia Transfer of Care Note  Patient: [name]  Procedure(s) Performed: Procedure(s) (LRB): CYSTOSCOPY/URETEROSCOPY/RETROGRADE PYELOGRAM/HOLMIUM LASER/STENT PLACEMENT (Right)  Patient Location: PACU  Anesthesia Type: General  Level of Consciousness: awake, sedated, patient cooperative and responds to stimulation  Airway & Oxygen Therapy: Patient Spontanous Breathing and Patient connected to NCO2  Post-op Assessment: Report given to PACU RN, Post -op Vital signs reviewed and stable and Patient moving all extremities  Post vital signs: Reviewed and stable  Complications: No apparent anesthesia complications

## 2017-11-22 NOTE — Op Note (Signed)
PATIENT:  [name]  PRE-OPERATIVE DIAGNOSIS: 1.  Right renal calculi 2.  Left flank pain  POST-OPERATIVE DIAGNOSIS: Same  PROCEDURE:  1. Cystoscopy with bilateral retrograde pyelograms including interpretation. 2. Right ureteroscopy, laser lithotripsy, stone extraction and stent placement. 3. Fluoroscopy time greater than 1 hour and less than 2 hours  SURGEON: Claybon Jabs, MD  INDICATION: Mrs. Boghosian is a 72 year old female who has had intermittent right flank pain and gross hematuria.  Imaging studies have revealed a stable stone in the middle pole of her right kidney as well as a smaller stone that has been seen in multiple different locations indicating possible intermittent obstruction and pain as well as the cause of her hematuria.  She is scheduled for ureteroscopic management of her right renal calculi.  When she was seen in the holding area preoperatively she reported last night experiencing severe left flank pain.  It was not associated with any hematuria.  She has not seen any stones pass.  We therefore discussed proceeding with evaluation of the left side as well under anesthesia.  ANESTHESIA:  General  EBL:  Minimal  DRAINS: 6 French, 24 cm double-J stent in the right ureter (with string, placed in vagina)  SPECIMEN: Stone given the patient  DESCRIPTION OF PROCEDURE: The patient was taken to the major OR and placed on the table. General anesthesia was administered and then the patient was moved to the dorsal lithotomy position. The genitalia was sterilely prepped and draped. An official timeout was performed.  Initially the 6 French cystoscope with 30 lens was passed under direct vision into the bladder. The bladder was then fully inspected. It was noted be free of any tumors, stones or inflammatory lesions. Ureteral orifices were of normal configuration and position. A 6 French open-ended ureteral catheter was then passed through the cystoscope into the ureteral  orifice in order to perform a left retrograde pyelogram.  Full-strength Omnipaque contrast was injected through the open-ended stent and up the left ureter under direct fluoroscopic control and this revealed an entirely normal ureter throughout its course with no hydronephrosis, mass-effect or filling defects.  The intrarenal collecting system filled completely and was noted to be completely normal as well.  I then observed the contrast flowing down the ureter unimpeded into the bladder indicating no obstruction.   I then performed a right retrograde pyelogram.  This was performed by injecting full-strength contrast up the right ureter under direct fluoroscopic control. It revealed a filling defect in the middle pole consistent with the I then stone seen on the preoperative imaging. The remainder of the ureter was noted to be normal as was the intrarenal collecting system. I then passed a 0.038 inch floppy-tipped guidewire through the open ended catheter and into the area of the renal pelvis and this was left in place. The inner portion of a ureteral access sheath was then passed over the guidewire to gently dilate the ureter up to the level of the renal pelvis.  I then assembled the access sheath and passed this over the guidewire without resistance up to the level of the renal pelvis after which I removed the inner portion of the access sheath as well as guidewire. I then proceeded with ureteroscopy.  A 6 French dual-lumen, digital, flexible ureteroscope was then passed through the access sheath and into the area of the renal pelvis.  I began by inspecting each calyx.  I started in the lower pole and found a stone that was small.  I used a 0 tip nitinol basket to engage the stone and remove this easily.  The stone in the middle pole was identified and I felt it was too large to extract and therefore elected to proceed with laser lithotripsy. The 200  holmium laser fiber was used to fragment the stone. I  then used the nitinol basket to extract the larger stone fragments and then "dusted" the smaller fragments.  Once the stone had been fully treated I continued inspecting the calyceal system and in the upper pole I noted another stone that was treated in an identical fashion.  I then removed the basket in order to achieve maximum flow and reinspected each calyx noting no significant stone fragments and multiple minute fragments all less than 1 mm in size.  I then passed the guidewire through the ureteroscope into the renal pelvis and left this in place and removed the ureteroscope.  I placed the inner portion of the access sheath over the guidewire, remove the guidewire and injected contrast again noting no evidence of extravasation.  The guidewire was then reinserted and left in place in the renal pelvis and the access sheath was completely removed.  I then backloaded the cystoscope over the guidewire and passed the stent over the guidewire into the area of the renal pelvis. As the guidewire was removed good curl was noted in the renal pelvis. The bladder was drained and the cystoscope was then removed. The patient tolerated the procedure well no intraoperative complications.  PLAN OF CARE: Discharge to home after PACU  PATIENT DISPOSITION:  PACU - hemodynamically stable.

## 2017-11-22 NOTE — Anesthesia Postprocedure Evaluation (Signed)
Anesthesia Post Note  Patient: [name]  Procedure(s) Performed: CYSTOSCOPY/URETEROSCOPY/RETROGRADE PYELOGRAM/HOLMIUM LASER/STENT PLACEMENT (Right )     Patient location during evaluation: PACU Anesthesia Type: General Level of consciousness: sedated and patient cooperative Pain management: pain level controlled Vital Signs Assessment: post-procedure vital signs reviewed and stable Respiratory status: spontaneous breathing Cardiovascular status: stable Anesthetic complications: no    Last Vitals:  Vitals:   11/22/17 1000 11/22/17 1055  BP: (!) 168/74 (!) 147/77  Pulse: 65 61  Resp: 18 16  Temp:  36.8 C  SpO2: 95% 97%    Last Pain:  Vitals:   11/22/17 1055  TempSrc:   PainSc: 0-No pain                 Nolon Nations

## 2017-11-22 NOTE — Anesthesia Procedure Notes (Signed)
Procedure Name: LMA Insertion Date/Time: 11/22/2017 7:35 AM Performed by: Justice Rocher, CRNA Pre-anesthesia Checklist: Patient identified, Emergency Drugs available, Suction available and Patient being monitored Patient Re-evaluated:Patient Re-evaluated prior to induction Oxygen Delivery Method: Circle system utilized Preoxygenation: Pre-oxygenation with 100% oxygen Induction Type: IV induction Ventilation: Mask ventilation without difficulty LMA: LMA inserted LMA Size: 4.0 Number of attempts: 1 Airway Equipment and Method: Bite block Placement Confirmation: positive ETCO2 and breath sounds checked- equal and bilateral Tube secured with: Tape Dental Injury: Teeth and Oropharynx as per pre-operative assessment

## 2017-11-22 NOTE — Discharge Instructions (Addendum)
Post stone removal/stent placement surgery instructions   Definitions:  Ureter: The duct that transports urine from the kidney to the bladder. Stent: A plastic hollow tube that is placed into the ureter, from the kidney to the bladder to prevent the ureter from swelling shut.  General instructions:  Despite the fact that no skin incisions were used, the area around the ureter and bladder is raw and irritated. The stent is a foreign body which will further irritate the bladder wall. This irritation is manifested by increased frequency of urination, both day and night, and by an increase in the urge to urinate. In some, the urge to urinate is present almost always. Sometimes the urge is strong enough that you may not be able to stop your self from urinating. The only real cure is to remove the stent and then give time for the bladder wall to heal which can't be done until the danger of the ureter swelling shut has passed. (This varies from 2-21 days).  You may see some blood in your urine while the stent is in place and a few days afterward. Do not be alarmed, even if the urine is clear for a while. Get off your feet and drink lots of fluids until clearing occurs. If you start to pass clots or don't improve, call us.  If you have a string coming from your urethra:  The stent string is attached to your ureteral stent.  Do not pull on thisIf you have a string coming from your urethra:  The stent string is attached to your ureteral stent.  Do not pull on this.  Diet:  You may return to your normal diet immediately. Because of the raw surface of your bladder, alcohol, spicy foods, foods high in acid and drinks with caffeine may cause irritation or frequency and should be used in moderation. To keep your urine flowing freely and avoid constipation, drink plenty of fluids during the day (8-10 glasses). Tip: Avoid cranberry juice because it is very acidic.  Activity:  Your physical activity doesn't need  to be restricted. However, if you are very active, you may see some blood in the urine. We suggest that you reduce your activity under the circumstances until the bleeding has stopped.  Bowels:  It is important to keep your bowels regular during the postoperative period. Straining with bowel movements can cause bleeding. A bowel movement every other day is reasonable. Use a mild laxative if needed, such as milk of magnesia 2-3 tablespoons, or 2 Dulcolax tablets. Call if you continue to have problems. If you had been taking narcotics for pain, before, during or after your surgery, you may be constipated. Take a laxative if necessary.   Medication:  You should resume your pre-surgery medications unless told not to. DO NOT RESUME YOUR ASPIRIN, or any other medicines like ibuprofen, motrin, excedrin, advil, aleve, vitamin E, fish oil as these can all cause bleeding x 7 days. In addition you may be given an antibiotic to prevent or treat infection. Antibiotics are not always necessary. All medication should be taken as prescribed until the bottles are finished unless you are having an unusual reaction to one of the drugs.  You may restart Eliquis in 48 hours if your urine is clear.  If it remains bloody please contact my office for instructions.  Problems you should report to [place]:  a. Fever greater than 101F. b. Heavy bleeding, or clots (see notes above about blood in urine). c. Inability to urinate.  d. Drug reactions (hives, rash, nausea, vomiting, diarrhea). e. Severe burning or pain with urination that is not improving.  Followup:  You will need a followup appointment to monitor your progress in most cases. Please call the office for this appointment when you get home if your appointment has not already been scheduled. Usually the first appointment will be about 5-14 days after your surgery and if you have a stent in place it will likely be removed at that time.   Post Anesthesia Home Care  Instructions  Activity: Get plenty of rest for the remainder of the day. A responsible individual must stay with you for 24 hours following the procedure.  For the next 24 hours, DO NOT: -Drive a car -Paediatric nurse -Drink alcoholic beverages -Take any medication unless instructed by your physician -Make any legal decisions or sign important papers.  Meals: Start with liquid foods such as gelatin or soup. Progress to regular foods as tolerated. Avoid greasy, spicy, heavy foods. If nausea and/or vomiting occur, drink only clear liquids until the nausea and/or vomiting subsides. Call your physician if vomiting continues.  Special Instructions/Symptoms: Your throat may feel dry or sore from the anesthesia or the breathing tube placed in your throat during surgery. If this causes discomfort, gargle with warm salt water. The discomfort should disappear within 24 hours.  If you had a scopolamine patch placed behind your ear for the management of post- operative nausea and/or vomiting:  1. The medication in the patch is effective for 72 hours, after which it should be removed.  Wrap patch in a tissue and discard in the trash. Wash hands thoroughly with soap and water. 2. You may remove the patch earlier than 72 hours if you experience unpleasant side effects which may include dry mouth, dizziness or visual disturbances. 3. Avoid touching the patch. Wash your hands with soap and water after contact with the patch.

## 2017-11-23 ENCOUNTER — Encounter (HOSPITAL_BASED_OUTPATIENT_CLINIC_OR_DEPARTMENT_OTHER): Payer: Self-pay | Admitting: Urology

## 2017-11-25 NOTE — Progress Notes (Signed)
Cardiology Office Note    Date:  11/26/2017   ID:  IYLAH DWORKIN, DOB Aug 20, 1945, MRN [id number]  PCP:  Jani Gravel, MD  Cardiologist: [name], MD   Chief Complaint  Patient presents with  . Atrial Fibrillation    History of Present Illness:  [name] is a 72 y.o. female follow-up paroxysmal atrial fibrillation, chronic diastolic HF, and chronic anticoagulation therapy.   [name] is doing okay.  She denies angina.  She has not had recurrent atrial fibrillation.  She has had lithotripsy and urinary stone retrieval by Dr. Karsten Ro.  She has stones with her as a conversational piece.  No cardiac issues during the procedure.  She is having terrible back discomfort.  Dr. Ellene Route is planning extensive lumbar surgical therapy that can last up to 10 hours.  She requests my opinion concerning surgical clearance from cardiac standpoint.   Past Medical History:  Diagnosis Date  . Acute hypoxemic respiratory failure ([place]) 10/24/2016  . Arthritis    both knees  . Atrial fibrillation, chronic ([place])   . Carpal tunnel syndrome, bilateral    both hands go to sleep at times from elbows down  . Chronic diastolic heart failure ([place])   . DDD (degenerative disc disease), lumbosacral   . Dyspnea   . First degree AV block   . Hematuria 11/15/2017  . History of kidney stones   . History of rib fracture 03/2011  . Obesity   . Postlaminectomy syndrome   . Pre-diabetes   . Renal calculi    BILATERAL  . Right ureteral stone   . Rotator cuff tear, left   . Rotator cuff tear, right     Past Surgical History:  Procedure Laterality Date  . ANTERIOR CERVICAL DECOMP/DISCECTOMY FUSION  03-18-2011   C3 -- C7  . APPENDECTOMY    . BILATERAL CARPAL TUNNEL RELEASE     3 screws in right hand, thumb, middle finger, and index finger,   . CARDIAC CATHETERIZATION N/A 01/16/2015   Procedure: Left Heart Cath and Coronary Angiography;  Surgeon: Belva Crome, MD;  Location: [facility];   Service: Cardiovascular;  Laterality: N/A;  . CHOLECYSTECTOMY OPEN  1970'S  . COLONOSCOPY  2018  . CYSTOSCOPY WITH RETROGRADE PYELOGRAM, URETEROSCOPY AND STENT PLACEMENT N/A 02/13/2013   Procedure: CYSTOSCOPY WITH RETROGRADE PYELOGRAM, URETEROSCOPY AND LITHOTRIPSY ;  Surgeon: Claybon Jabs, MD;  Location: [facility];  Service: Urology;  Laterality: N/A;  . CYSTOSCOPY/URETEROSCOPY/HOLMIUM LASER/STENT PLACEMENT Right 11/22/2017   Procedure: CYSTOSCOPY/URETEROSCOPY/RETROGRADE PYELOGRAM/HOLMIUM LASER/STENT PLACEMENT;  Surgeon: Kathie Rhodes, MD;  Location: [facility];  Service: Urology;  Laterality: Right;  . EXTRACORPOREAL SHOCK WAVE LITHOTRIPSY Right 05-16-2012  . FINGER ARTHROSCOPY WITH CARPOMETACARPEL (CMC) ARTHROPLASTY    . HOLMIUM LASER APPLICATION Right 10/14/100   Procedure: HOLMIUM LASER APPLICATION;  Surgeon: Claybon Jabs, MD;  Location: [facility];  Service: Urology;  Laterality: Right;  . KNEE ARTHROSCOPY Bilateral   . LUMBAR LAMINECTOMY  X2  1960'S  . NASAL SEPTUM SURGERY  1970'S  . SHOULDER OPEN ROTATOR CUFF REPAIR Bilateral LEFT 01-11-2001/   RIGHT 07-21-2002  . TOTAL KNEE ARTHROPLASTY  04/13/2012   Procedure: TOTAL KNEE ARTHROPLASTY;  Surgeon: Gearlean Alf, MD;  [facility];  Service: Orthopedics;  Laterality: Left;  . TOTAL KNEE ARTHROPLASTY  08/08/2012   Procedure: TOTAL KNEE ARTHROPLASTY;  Surgeon: Gearlean Alf, MD;  Location: [facility];  Service: Orthopedics;  Laterality: Right;  .  TRANSTHORACIC ECHOCARDIOGRAM  03-29-2011   MODERATE LVH/ EF 60-65%/ MILD MR  . VAGINAL HYSTERECTOMY  1960'S    Current Medications: Outpatient Medications Prior to Visit  Medication Sig Dispense Refill  . acetaminophen (TYLENOL) 325 MG tablet Take 650 mg by mouth every 6 (six) hours as needed for mild pain.    Marland Kitchen apixaban (ELIQUIS) 5 MG TABS tablet Take 1 tablet (5 mg total) by mouth 2 (two) times daily. 180 tablet 3  . CARTIA XT 120 MG 24 hr  capsule TAKE 1 CAPSULE(120 MG) BY MOUTH DAILY 90 capsule 1  . diclofenac sodium (VOLTAREN) 1 % GEL Apply 2 g topically daily as needed for pain.    Marland Kitchen HYDROcodone-acetaminophen (NORCO) 10-325 MG tablet Take 1-2 tablets by mouth every 4 (four) hours as needed for moderate pain. Maximum dose per 24 hours - 8 pills 20 tablet 0  . montelukast (SINGULAIR) 10 MG tablet Take 10 mg by mouth at bedtime.    . phenazopyridine (PYRIDIUM) 200 MG tablet Take 1 tablet (200 mg total) by mouth 3 (three) times daily as needed for pain. 20 tablet 0  . albuterol (PROVENTIL HFA;VENTOLIN HFA) 108 (90 Base) MCG/ACT inhaler Inhale 1 puff into the lungs every 6 (six) hours as needed for wheezing or shortness of breath.     No facility-administered medications prior to visit.      Allergies:   Tape   Social History   Socioeconomic History  . Marital status: Married    Spouse name: Not on file  . Number of children: Not on file  . Years of education: Not on file  . Highest education level: Not on file  Occupational History  . Not on file  Social Needs  . Financial resource strain: Not on file  . Food insecurity:    Worry: Not on file    Inability: Not on file  . Transportation needs:    Medical: Not on file    Non-medical: Not on file  Tobacco Use  . Smoking status: Never Smoker  . Smokeless tobacco: Never Used  Substance and Sexual Activity  . Alcohol use: No  . Drug use: No  . Sexual activity: Not on file  Lifestyle  . Physical activity:    Days per week: Not on file    Minutes per session: Not on file  . Stress: Not on file  Relationships  . Social connections:    Talks on phone: Not on file    Gets together: Not on file    Attends religious service: Not on file    Active member of club or organization: Not on file    Attends meetings of clubs or organizations: Not on file    Relationship status: Not on file  Other Topics Concern  . Not on file  Social History Narrative  . Not on file       Family History:  The patient's family history includes Diabetes in her brother, brother, and sister; Diabetes type II in her mother; Heart disease in her brother and brother; Hypertension in her brother, brother, and sister; Lung cancer in her father.   ROS:   Please see the history of present illness.    Npne  All other systems reviewed and are negative.   PHYSICAL EXAM:   VS:  BP (!) 160/78   Pulse 76   Ht 5\' 2"  (1.575 m)   Wt 227 lb (103 kg)   BMI 41.52 kg/m    GEN: Well nourished, well  developed, in no acute distress  HEENT: normal  Neck: no JVD, carotid bruits, or masses Cardiac: RRR; no murmurs, rubs, or gallops,no edema  Respiratory:  clear to auscultation bilaterally, normal work of breathing GI: soft, nontender, nondistended, + BS MS: no deformity or atrophy  Skin: warm and dry, no rash Neuro:  Alert and Oriented x 3, Strength and sensation are intact Psych: euthymic mood, full affect  Wt Readings from Last 3 Encounters:  11/26/17 227 lb (103 kg)  11/22/17 225 lb 12.8 oz (102.4 kg)  11/27/16 240 lb 6.4 oz (109 kg)      Studies/Labs Reviewed:   EKG:  EKG performed 11/22/2017 reveals sinus rhythm, poor R wave progression, precordial nonspecific ST abnormality.  No change from prior tracings.  Recent Labs: 11/22/2017: Hemoglobin 11.2; Potassium 3.7; Sodium 143   Lipid Panel No results found for: CHOL, TRIG, HDL, CHOLHDL, VLDL, LDLCALC, LDLDIRECT  Additional studies/ records that were reviewed today include:   Cardiac catheterization May 2019: Conclusion    Normal coronary arteries  Normal left ventricular systolic function  False positive myocardial perfusion study     RECOMMENDATIONS:  No further ischemic evaluation is necessary      ASSESSMENT:    1. Paroxysmal atrial fibrillation (HCC)   2. Essential hypertension   3. Preoperative cardiovascular examination   4. Chronic diastolic heart failure ([place])   5. Chronic anticoagulation       PLAN:  In order of problems listed above:  1. No recent clinical recurrences.  She is protected with chronic anticoagulation therapy using Eliquis.  2. Blood pressure once reevaluated after resting is 135/80 mmHg.  Change in therapy 3. She is cleared for upcoming prolonged neurosurgical procedure.  She will be at a slightly elevated risk for cardiac complications because of her history of paroxysmal atrial fibrillation which could occur.  This could be easily managed.  No further evaluation is necessary.  No evidence of heart failure.  No history of coronary disease.  Normal cath 2016. 4. No evidence of volume overload 5. Eliquis is currently on hold after a urologic procedure for stone removal within the past 24 hours.  Still having some mild hematuria.  Overall doing well.  Clinical follow-up in 1 year.  Resume Eliquis when okayed by urology.  Prior to upcoming neurosurgical procedure, the medication should be held at least 48 and preferably 72 hours before.  Resume the medication when safe per Dr. Ellene Route.  Medication Adjustments/Labs and Tests Ordered: Current medicines are reviewed at length with the patient today.  Concerns regarding medicines are outlined above.  Medication changes, Labs and Tests ordered today are listed in the Patient Instructions below. Patient Instructions  Medication Instructions:  Your physician recommends that you continue on your current medications as directed. Please refer to the Current Medication list given to you today.  Labwork: None  Testing/Procedures: None  Follow-Up: Your physician wants you to follow-up in: 1 year with Dr. Tamala Julian.  You will receive a reminder letter in the mail two months in advance. If you don't receive a letter, please call our office to schedule the follow-up appointment.   Any Other Special Instructions Will Be Listed Below (If Applicable).     If you need a refill on your cardiac medications before your next  appointment, please call your pharmacy.      Signed, [name], MD  11/26/2017 9:59 AM    [facility] [address], [place], [place]  [address]  Phone: [phone]; Fax: [phone]

## 2017-11-26 ENCOUNTER — Ambulatory Visit: Payer: PPO | Admitting: Interventional Cardiology

## 2017-11-26 ENCOUNTER — Encounter: Payer: Self-pay | Admitting: Interventional Cardiology

## 2017-11-26 VITALS — BP 160/78 | HR 76 | Ht 62.0 in | Wt 227.0 lb

## 2017-11-26 DIAGNOSIS — Z01810 Encounter for preprocedural cardiovascular examination: Secondary | ICD-10-CM

## 2017-11-26 DIAGNOSIS — I5032 Chronic diastolic (congestive) heart failure: Secondary | ICD-10-CM

## 2017-11-26 DIAGNOSIS — I480 Paroxysmal atrial fibrillation: Secondary | ICD-10-CM

## 2017-11-26 DIAGNOSIS — I10 Essential (primary) hypertension: Secondary | ICD-10-CM

## 2017-11-26 DIAGNOSIS — Z7901 Long term (current) use of anticoagulants: Secondary | ICD-10-CM | POA: Diagnosis not present

## 2017-11-26 NOTE — Patient Instructions (Signed)

## 2017-11-27 ENCOUNTER — Other Ambulatory Visit: Payer: Self-pay | Admitting: Interventional Cardiology

## 2017-11-29 ENCOUNTER — Other Ambulatory Visit: Payer: Self-pay | Admitting: Neurological Surgery

## 2017-11-30 DIAGNOSIS — Z87442 Personal history of urinary calculi: Secondary | ICD-10-CM | POA: Diagnosis not present

## 2017-11-30 DIAGNOSIS — R8279 Other abnormal findings on microbiological examination of urine: Secondary | ICD-10-CM | POA: Diagnosis not present

## 2017-12-08 DIAGNOSIS — M79641 Pain in right hand: Secondary | ICD-10-CM | POA: Diagnosis not present

## 2017-12-08 DIAGNOSIS — G5601 Carpal tunnel syndrome, right upper limb: Secondary | ICD-10-CM | POA: Diagnosis not present

## 2017-12-08 DIAGNOSIS — M1811 Unilateral primary osteoarthritis of first carpometacarpal joint, right hand: Secondary | ICD-10-CM | POA: Diagnosis not present

## 2017-12-16 NOTE — Pre-Procedure Instructions (Signed)
Symphony Demuro Counsell  12/16/2017      [facility] [place], [place] AT [facility] [address] [place] [address] Phone: [phone] Fax: [phone]  [facility] [phone] [place], [place] - [address] AT [address] [place] [address] Phone: [phone] Fax: [phone]  [facility] [place], [place] AT [address] [place] [address] Phone: [phone] Fax: [phone]    Your procedure is scheduled on Fri., December 24, 2017 from 7:30AM-5:30PM  Report to [facility] Admitting Entrance "A" at 5:30AM  Call this number if you have problems the morning of surgery:  [phone]   Remember:  No food or drinks after midnight on June 13th   Take these medicines the morning of surgery with A SIP OF WATER By 4:30AM: Diltiazem (CARTIA XT). If needed Acetaminophen (TYLENOL).  Follow your doctors instructions regarding your Eliquis.  If no instructions were given by your doctor, then you will need to call the prescribing office office to get instructions.  Last dose 12/20/17, per Dr. Tamala Julian  As of today, stop taking all Other Aspirin Products, Vitamins, Fish oils, and Herbal medications. Also stop all NSAIDS i.e. Advil, Ibuprofen, Motrin, Aleve, Anaprox, Naproxen, BC, Goody Powders, and all Supplements.    Do not wear jewelry, make-up or nail polish (fingers).  Do not wear lotions, powders, or perfumes, or deodorant.  Do not shave 48 hours prior to surgery.    Do not bring valuables to the hospital.  [facility] is not responsible for any belongings or valuables.  Contacts, dentures or bridgework may not be worn into surgery.  Leave your suitcase in the car.  After surgery it may be brought to your room.  For patients admitted to the hospital, discharge time will be determined by your treatment  team.  Patients discharged the day of surgery will not be allowed to drive home.   Special instructions:   [facility]- Preparing For Surgery  Before surgery, you can play an important role. Because skin is not sterile, your skin needs to be as free of germs as possible. You can reduce the number of germs on your skin by washing with CHG (chlorahexidine gluconate) Soap before surgery.  CHG is an antiseptic cleaner which kills germs and bonds with the skin to continue killing germs even after washing.    Oral Hygiene is also important to reduce your risk of infection.  Remember - BRUSH YOUR TEETH THE MORNING OF SURGERY WITH YOUR REGULAR TOOTHPASTE  Please do not use if you have an allergy to CHG or antibacterial soaps. If your skin becomes reddened/irritated stop using the CHG.  Do not shave (including legs and underarms) for at least 48 hours prior to first CHG shower. It is OK to shave your face.  Please follow these instructions carefully.   1. Shower the NIGHT BEFORE SURGERY and the MORNING OF SURGERY with CHG.   2. If you chose to wash your hair, wash your hair first as usual with your normal shampoo.  3. After you shampoo, rinse your hair and body thoroughly to remove the shampoo.  4. Use CHG as you would any other liquid soap. You can apply CHG directly to the skin and wash gently with a scrungie or a clean washcloth.   5. Apply the  CHG Soap to your body ONLY FROM THE NECK DOWN.  Do not use on open wounds or open sores. Avoid contact with your eyes, ears, mouth and genitals (private parts). Wash Face and genitals (private parts)  with your normal soap.  6. Wash thoroughly, paying special attention to the area where your surgery will be performed.  7. Thoroughly rinse your body with warm water from the neck down.  8. DO NOT shower/wash with your normal soap after using and rinsing off the CHG Soap.  9. Pat yourself dry with a CLEAN TOWEL.  10. Wear CLEAN PAJAMAS to bed the  night before surgery, wear comfortable clothes the morning of surgery  11. Place CLEAN SHEETS on your bed the night of your first shower and DO NOT SLEEP WITH PETS.  Day of Surgery:  Do not apply any deodorants/lotions.  Please wear clean clothes to the hospital/surgery center.   Remember to brush your teeth WITH YOUR REGULAR TOOTHPASTE.  Please read over the following fact sheets that you were given. Pain Booklet, Coughing and Deep Breathing, MRSA Information and Surgical Site Infection Prevention

## 2017-12-17 ENCOUNTER — Encounter (HOSPITAL_COMMUNITY): Payer: Self-pay

## 2017-12-17 ENCOUNTER — Other Ambulatory Visit: Payer: Self-pay

## 2017-12-17 ENCOUNTER — Encounter (HOSPITAL_COMMUNITY)
Admission: RE | Admit: 2017-12-17 | Discharge: 2017-12-17 | Disposition: A | Discharge status: Home / Self Care | Payer: PPO | Source: Ambulatory Visit | Attending: Neurological Surgery | Admitting: Neurological Surgery

## 2017-12-17 DIAGNOSIS — Z01812 Encounter for preprocedural laboratory examination: Secondary | ICD-10-CM | POA: Diagnosis not present

## 2017-12-17 HISTORY — DX: Spinal stenosis, lumbar region without neurogenic claudication: M48.061

## 2017-12-17 LAB — BASIC METABOLIC PANEL
Anion gap: 11 (ref 5–15)
BUN: 15 mg/dL (ref 6–20)
CALCIUM: 9.3 mg/dL (ref 8.9–10.3)
CO2: 26 mmol/L (ref 22–32)
CREATININE: 0.76 mg/dL (ref 0.44–1.00)
Chloride: 106 mmol/L (ref 101–111)
GFR calc Af Amer: >60 mL/min (ref >60)
GFR calc non Af Amer: >60 mL/min (ref >60)
Glucose, Bld: 126 mg/dL — ABNORMAL HIGH (ref 65–99)
Potassium: 3.8 mmol/L (ref 3.5–5.1)
Sodium: 143 mmol/L (ref 135–145)

## 2017-12-17 LAB — CBC
HCT: 38.1 % (ref 36.0–46.0)
Hemoglobin: 12.2 g/dL (ref 12.0–15.0)
MCH: 28.4 pg (ref 26.0–34.0)
MCHC: 32 g/dL (ref 30.0–36.0)
MCV: 88.6 fL (ref 78.0–100.0)
PLATELETS: 400 10*3/uL (ref 150–400)
RBC: 4.3 MIL/uL (ref 3.87–5.11)
RDW: 12.7 % (ref 11.5–15.5)
WBC: 8.4 10*3/uL (ref 4.0–10.5)

## 2017-12-17 LAB — SURGICAL PCR SCREEN
MRSA, PCR: NEGATIVE
Staphylococcus aureus: NEGATIVE

## 2017-12-17 NOTE — Progress Notes (Signed)
PCP - Dr. Jani Gravel  Cardiologist - Dr. Daneen Schick  Chest x-ray - Denies  EKG - 11/22/17 (E)  Stress Test - 01/09/15 (E)  ECHO - 10/25/16 (E)  Cardiac Cath - 01/16/15 (E)  Sleep Study - Denies CPAP - None  LABS- 12/17/17: CBC, BMP PT: 12/24/17  ASA- Denies   Anesthesia- Yes- Cardiac history  Pt denies having chest pain, sob, or fever at this time. All instructions explained to the pt, with a verbal understanding of the material. Pt agrees to go over the instructions while at home for a better understanding. The opportunity to ask questions was provided.

## 2017-12-20 ENCOUNTER — Other Ambulatory Visit: Payer: Self-pay

## 2017-12-20 ENCOUNTER — Encounter (HOSPITAL_COMMUNITY): Payer: Self-pay | Admitting: Emergency Medicine

## 2017-12-20 ENCOUNTER — Ambulatory Visit: Payer: PPO | Attending: Orthopedic Surgery | Admitting: Physical Therapy

## 2017-12-20 DIAGNOSIS — G8929 Other chronic pain: Secondary | ICD-10-CM | POA: Diagnosis not present

## 2017-12-20 DIAGNOSIS — M5441 Lumbago with sciatica, right side: Secondary | ICD-10-CM | POA: Insufficient documentation

## 2017-12-20 DIAGNOSIS — M5442 Lumbago with sciatica, left side: Secondary | ICD-10-CM | POA: Diagnosis not present

## 2017-12-20 DIAGNOSIS — M6281 Muscle weakness (generalized): Secondary | ICD-10-CM | POA: Diagnosis not present

## 2017-12-20 NOTE — Progress Notes (Signed)
Anesthesia Chart Review:   Case:  [id number] Date/Time:  12/24/17 0715   Procedures:      Anterolateral decompression  - L1-L2 - L2-L3 - L3-L4 - L4-L5 (N/A )     Posterior bilat laminectomies L4-5 L5-S1 (Bilateral Back)     LUMBAR PERCUTANEOUS PEDICLE SCREW 4 LEVEL (N/A )     APPLICATION OF ROBOTIC ASSISTANCE FOR SPINAL PROCEDURE (N/A )   Anesthesia type:  General   Pre-op diagnosis:  Stenosis   Location:  [facility] OR ROOM 21 / [facility]   Surgeon:  Kristeen Miss, MD      DISCUSSION: - Pt is a 72 year old female with hx PAF, chronic diastolic HF, HTN  - Last dose eliquis 12/20/17  - Pt has cardiac clearance for surgery   VS: BP (!) 146/65   Pulse 76   Temp 36.6 C (Oral)   Resp 20   Ht 5' 1.5" (1.562 m)   Wt 225 lb 4.8 oz (102.2 kg)   SpO2 96%   BMI 41.88 kg/m    PROVIDERS: - PCP is Jani Gravel, MD - Cardiologist is Daneen Schick, MD. Pt cleared for surgery at last office visit 11/26/17   LABS: Labs reviewed: Acceptable for surgery.  - PT/INR will be obtained day of surgery  (all labs ordered are listed, but only abnormal results are displayed)  Labs Reviewed  BASIC METABOLIC PANEL - Abnormal; Notable for the following components:      Result Value   Glucose, Bld 126 (*)    All other components within normal limits  SURGICAL PCR SCREEN  CBC    EKG 11/22/17: Sinus rhythm with 1st degree A-V block. ST & T wave abnormality, consider anterior ischemia   CV:  Echo 10/25/16:  - Left ventricle: The cavity size was normal. There was mild concentric hypertrophy. Systolic function was normal. The estimated ejection fraction was in the range of 55% to 60%. Wall motion was normal; there were no regional wall motion abnormalities. Doppler parameters are consistent with abnormal left ventricular relaxation (grade 1 diastolic dysfunction). - Mitral valve: Valve area by pressure half-time: 1.76 cm^2. - Left atrium: The atrium was mildly to moderately dilated.  Cardiac cath 01/16/15:    Normal coronary arteries  Normal left ventricular systolic function  False positive myocardial perfusion study   Past Medical History:  Diagnosis Date  . Acute hypoxemic respiratory failure ([place]) 10/24/2016  . Arthritis    both knees  . Atrial fibrillation, chronic ([place])   . Carpal tunnel syndrome, bilateral    both hands go to sleep at times from elbows down  . Chronic diastolic heart failure ([place])   . DDD (degenerative disc disease), lumbosacral   . Dyspnea   . First degree AV block   . Hematuria 11/15/2017  . History of kidney stones   . History of rib fracture 03/2011  . Lumbar stenosis   . Obesity   . Postlaminectomy syndrome   . Pre-diabetes   . Renal calculi    BILATERAL  . Right ureteral stone   . Rotator cuff tear, left   . Rotator cuff tear, right     Past Surgical History:  Procedure Laterality Date  . ANTERIOR CERVICAL DECOMP/DISCECTOMY FUSION  03-18-2011   C3 -- C7  . APPENDECTOMY    . BILATERAL CARPAL TUNNEL RELEASE     3 screws in right hand, thumb, middle finger, and index finger,   . CARDIAC CATHETERIZATION N/A 01/16/2015   Procedure: Left Heart Cath  and Coronary Angiography;  Surgeon: Belva Crome, MD;  Location: [facility];  Service: Cardiovascular;  Laterality: N/A;  . CHOLECYSTECTOMY OPEN  1970'S  . COLONOSCOPY  2018  . CYSTOSCOPY WITH RETROGRADE PYELOGRAM, URETEROSCOPY AND STENT PLACEMENT N/A 02/13/2013   Procedure: CYSTOSCOPY WITH RETROGRADE PYELOGRAM, URETEROSCOPY AND LITHOTRIPSY ;  Surgeon: Claybon Jabs, MD;  Location: [facility];  Service: Urology;  Laterality: N/A;  . CYSTOSCOPY/URETEROSCOPY/HOLMIUM LASER/STENT PLACEMENT Right 11/22/2017   Procedure: CYSTOSCOPY/URETEROSCOPY/RETROGRADE PYELOGRAM/HOLMIUM LASER/STENT PLACEMENT;  Surgeon: Kathie Rhodes, MD;  Location: [facility];  Service: Urology;  Laterality: Right;  . EXTRACORPOREAL SHOCK WAVE LITHOTRIPSY Right 05-16-2012  . FINGER ARTHROSCOPY WITH  CARPOMETACARPEL (CMC) ARTHROPLASTY    . HOLMIUM LASER APPLICATION Right 03/19/5882   Procedure: HOLMIUM LASER APPLICATION;  Surgeon: Claybon Jabs, MD;  Location: [facility];  Service: Urology;  Laterality: Right;  . KNEE ARTHROSCOPY Bilateral   . LUMBAR LAMINECTOMY  X2  1960'S  . NASAL SEPTUM SURGERY  1970'S  . SHOULDER OPEN ROTATOR CUFF REPAIR Bilateral LEFT 01-11-2001/   RIGHT 07-21-2002  . TOTAL KNEE ARTHROPLASTY  04/13/2012   Procedure: TOTAL KNEE ARTHROPLASTY;  Surgeon: Gearlean Alf, MD;  [facility];  Service: Orthopedics;  Laterality: Left;  . TOTAL KNEE ARTHROPLASTY  08/08/2012   Procedure: TOTAL KNEE ARTHROPLASTY;  Surgeon: Gearlean Alf, MD;  [facility];  Service: Orthopedics;  Laterality: Right;  . TRANSTHORACIC ECHOCARDIOGRAM  03-29-2011   MODERATE LVH/ EF 60-65%/ MILD MR  . VAGINAL HYSTERECTOMY  1960'S    MEDICATIONS: . oxyCODONE (OXY IR/ROXICODONE) 5 MG immediate release tablet  . pravastatin (PRAVACHOL) 10 MG tablet  . acetaminophen (TYLENOL) 325 MG tablet  . apixaban (ELIQUIS) 5 MG TABS tablet  . diclofenac sodium (VOLTAREN) 1 % GEL  . diltiazem (CARTIA XT) 120 MG 24 hr capsule  . HYDROcodone-acetaminophen (NORCO) 10-325 MG tablet  . montelukast (SINGULAIR) 10 MG tablet  . phenazopyridine (PYRIDIUM) 200 MG tablet   No current facility-administered medications for this encounter.    - Last dose eliquis 12/20/17   If labs acceptable day of surgery, I anticipate pt can proceed with surgery as scheduled.    Willeen Cass, FNP-BC [facility] Short Stay [facility]/Anesthesiology Phone: [phone] 12/20/2017 11:11 AM

## 2017-12-20 NOTE — Therapy (Signed)
[place], [place], [address] Phone: [phone]   Fax:  [phone]  Physical Therapy Evaluation  Patient Details  Name: [name] MRN: [id number] Date of Birth: [birth year] Referring Provider: Dr. Kristeen Miss   Encounter Date: 12/20/2017  PT End of Session - 12/20/17 1105    Visit Number  1    Date for PT Re-Evaluation  03/14/18    PT Start Time  1105    PT Stop Time  1150    PT Time Calculation (min)  45 min    Activity Tolerance  Patient tolerated treatment well    Behavior During Therapy  [facility] for tasks assessed/performed       Past Medical History:  Diagnosis Date  . Acute hypoxemic respiratory failure ([place]) 10/24/2016  . Arthritis    both knees  . Atrial fibrillation, chronic ([place])   . Carpal tunnel syndrome, bilateral    both hands go to sleep at times from elbows down  . Chronic diastolic heart failure ([place])   . DDD (degenerative disc disease), lumbosacral   . Dyspnea   . First degree AV block   . Hematuria 11/15/2017  . History of kidney stones   . History of rib fracture 03/2011  . Lumbar stenosis   . Obesity   . Postlaminectomy syndrome   . Pre-diabetes   . Renal calculi    BILATERAL  . Right ureteral stone   . Rotator cuff tear, left   . Rotator cuff tear, right     Past Surgical History:  Procedure Laterality Date  . ANTERIOR CERVICAL DECOMP/DISCECTOMY FUSION  03-18-2011   C3 -- C7  . APPENDECTOMY    . BILATERAL CARPAL TUNNEL RELEASE     3 screws in right hand, thumb, middle finger, and index finger,   . CARDIAC CATHETERIZATION N/A 01/16/2015   Procedure: Left Heart Cath and Coronary Angiography;  Surgeon: Belva Crome, MD;  Location: [facility];  Service: Cardiovascular;  Laterality: N/A;  . CHOLECYSTECTOMY OPEN  1970'S  . COLONOSCOPY  2018  . CYSTOSCOPY WITH RETROGRADE PYELOGRAM, URETEROSCOPY AND STENT PLACEMENT N/A 02/13/2013   Procedure:  CYSTOSCOPY WITH RETROGRADE PYELOGRAM, URETEROSCOPY AND LITHOTRIPSY ;  Surgeon: Claybon Jabs, MD;  Location: [facility];  Service: Urology;  Laterality: N/A;  . CYSTOSCOPY/URETEROSCOPY/HOLMIUM LASER/STENT PLACEMENT Right 11/22/2017   Procedure: CYSTOSCOPY/URETEROSCOPY/RETROGRADE PYELOGRAM/HOLMIUM LASER/STENT PLACEMENT;  Surgeon: Kathie Rhodes, MD;  Location: [facility];  Service: Urology;  Laterality: Right;  . EXTRACORPOREAL SHOCK WAVE LITHOTRIPSY Right 05-16-2012  . FINGER ARTHROSCOPY WITH CARPOMETACARPEL (CMC) ARTHROPLASTY    . HOLMIUM LASER APPLICATION Right 11/13/2990   Procedure: HOLMIUM LASER APPLICATION;  Surgeon: Claybon Jabs, MD;  Location: [facility];  Service: Urology;  Laterality: Right;  . KNEE ARTHROSCOPY Bilateral   . LUMBAR LAMINECTOMY  X2  1960'S  . NASAL SEPTUM SURGERY  1970'S  . SHOULDER OPEN ROTATOR CUFF REPAIR Bilateral LEFT 01-11-2001/   RIGHT 07-21-2002  . TOTAL KNEE ARTHROPLASTY  04/13/2012   Procedure: TOTAL KNEE ARTHROPLASTY;  Surgeon: Gearlean Alf, MD;  Location: [facility];  Service: Orthopedics;  Laterality: Left;  . TOTAL KNEE ARTHROPLASTY  08/08/2012   Procedure: TOTAL KNEE ARTHROPLASTY;  Surgeon: Gearlean Alf, MD;  Location: [facility];  Service: Orthopedics;  Laterality: Right;  . TRANSTHORACIC ECHOCARDIOGRAM  03-29-2011   MODERATE LVH/ EF 60-65%/ MILD MR  . VAGINAL HYSTERECTOMY  1960'S  There were no vitals filed for this visit.   Subjective Assessment - 12/20/17 1106    Subjective  Patient has long h/o back pain with 2 surgeries in her 20's. Her pain has progressively worsened over the years. Patient has been having injections for 3-4 years in her back. She has had previous therapy on her back at the time she had shoulder therapy with temporary relief.  She is now limited to standing and walking to less than 5 min which affects ADLs including chores.  She must take pain medications 3x/day to tolerate activity.  Patient scheduled to have multilevel fusion in her lumbar spine 12/24/17.    Patient is accompained by:  Family member    Pertinent History  degenerative scoliosis, previous lumbar surgeries, cervcal fusion, Bil TKR, a-fib    Limitations  Walking;Standing    Diagnostic tests  xray; mri; CT    Patient Stated Goals  to lessen pain    Currently in Pain?  Yes    Pain Score  8     Pain Location  Back    Pain Orientation  Lower    Pain Descriptors / Indicators  Burning    Pain Type  Chronic pain    Pain Radiating Towards  RLE to knee    Pain Onset  More than a month ago    Pain Frequency  Constant    Aggravating Factors   standing and walking    Pain Relieving Factors  sitting    Effect of Pain on Daily Activities  limited, see above         [facility] PT Assessment - 12/20/17 0001      Assessment   Medical Diagnosis  Degenerative Scoliosis    Referring Provider  Dr. Kristeen Miss    Onset Date/Surgical Date  07/13/12    Next MD Visit  12/24/17    Prior Therapy  yes      Precautions   Precautions  None      Balance Screen   Has the patient fallen in the past 6 months  No    Has the patient had a decrease in activity level because of a fear of falling?   No    Is the patient reluctant to leave their home because of a fear of falling?   No      Home Environment   Living Environment  Private residence    Living Arrangements  Spouse/significant other    Type of [place] to enter    Entrance Stairs-Number of Steps  2    Entrance Stairs-Rails  None    Home Layout  One level    [place] - single point;Walker - 2 wheels      Prior Function   Level of Independence  Needs assistance with ADLs;Independent with household mobility with device    Dressing  Minimal    Vocation  Retired      Observation/Other Assessments   Focus on Therapeutic Outcomes (FOTO)   78% LIMITED      Posture/Postural Control   Posture Comments  depressed left shoulder; R  lumbar scoliosis      ROM / Strength   AROM / PROM / Strength  AROM;Strength      AROM   AROM Assessment Site  Lumbar    Lumbar Flexion  10  deg    Lumbar Extension  WFL    Lumbar - Right Side Bend  15 deg    Lumbar - Left Side Bend  7 deg    Lumbar - Right Rotation  25%    Lumbar - Left Rotation  50%      Strength   Strength Assessment Site  Hip;Knee    Right/Left Hip  Right;Left    Right Hip Flexion  3+/5    Right Hip Extension  2+/5    Right Hip External Rotation   5/5    Right Hip ABduction  3+/5    Right Hip ADduction  3+/5    Left Hip Flexion  3/5    Left Hip Extension  2+/5    Left Hip External Rotation  5/5    Left Hip ABduction  3+/5    Left Hip ADduction  3+/5    Right/Left Knee  Right;Left    Right Knee Flexion  3+/5    Right Knee Extension  3+/5    Left Knee Flexion  4-/5    Left Knee Extension  5/5 Cogwheel      Flexibility   Soft Tissue Assessment /Muscle Length  yes    Hamstrings  limited by SLR pain    Piriformis  WNL    Quadratus Lumborum  left tight due to scoliosis      Palpation   Palpation comment  Bil gluteals L>R      Special Tests   Other special tests  Positive SLR and slump tests Bil      Bed Mobility   Bed Mobility  -- unable to bridge and scoot in bed without assist      Ambulation/Gait   Ambulation/Gait  Yes    Ambulation/Gait Assistance  6: Modified independent (Device/Increase time)    Ambulation Distance (Feet)  20 Feet    Assistive device  Straight cane    Gait Pattern  Decreased step length - right;Decreased step length - left;Decreased stance time - right;Decreased stance time - left;Decreased stride length;Trendelenburg    Ambulation Surface  Level    Gait Comments  patient unsteady                Objective measurements completed on examination: See above findings.              PT Education - 12/20/17 1211    Education Details  HEP    Person(s) Educated  Patient    Methods   Explanation;Demonstration;Handout    Comprehension  Verbalized understanding;Returned demonstration       PT Short Term Goals - 12/20/17 1224      PT SHORT TERM GOAL #1   Title  I with HEP    Time  2    Period  Weeks    Status  New        PT Long Term Goals - 12/20/17 1224      PT LONG TERM GOAL #1   Title  I with advanced HEP    Time  8    Period  Weeks    Status  New      PT LONG TERM GOAL #2   Title  Patient able to bridge in bed to improve bed mobility.    Time  8    Period  Weeks    Status  New      PT LONG TERM GOAL #3   Title  Patient to demo improved BLE strength to 4/5 or better to normalize gait and ADLS    Time  8    Period  Weeks  Status  New      PT LONG TERM GOAL #4   Title  Patient to report decreased pain with ADLs by 50%.    Time  8    Period  Weeks    Status  New      PT LONG TERM GOAL #5   Title  Patient able to stand and walk for 15 min or more without needing to sit due to pain.    Time  8    Period  Weeks    Status  New    Target Date  --             Plan - 12/20/17 1221    Clinical Impression Statement  Patient presents today for a moderate complexity evaluation for complaints of low back pain which radiates into BLE. She has marked limitations in ROM and strength affecting ADLS including gait, dressing and bed mobility. She has decreased patellar tendon reflexes bil as well. Patient has had temporary relief from PT in the past.     History and Personal Factors relevant to plan of care:  2 previous lumbar surgeries, degeneratived scoliosis, cervical fusion, Bil TKR, A-fib    Clinical Presentation  Evolving    Clinical Presentation due to:  worsening symptoms    Clinical Decision Making  Moderate    Rehab Potential  Fair    Clinical Impairments Affecting Rehab Potential  degenerative scoliosis    PT Frequency  2x / week    PT Duration  8 weeks    PT Treatment/Interventions  ADLs/Self Care Home Management;Cryotherapy;Electrical  Stimulation;Moist Heat;Traction;Ultrasound;Gait training;Therapeutic exercise;Balance training;Neuromuscular re-education;Patient/family education;Manual techniques;Passive range of motion;Dry needling    PT Next Visit Plan  ROM, strengthening, pain control    PT Home Exercise Plan  SKTC, bridge, ab set, LTR    Consulted and Agree with Plan of Care  Patient       Patient will benefit from skilled therapeutic intervention in order to improve the following deficits and impairments:  Abnormal gait, Decreased range of motion, Difficulty walking, Decreased activity tolerance, Pain, Decreased balance, Decreased strength, Postural dysfunction, Impaired flexibility  Visit Diagnosis: Chronic bilateral low back pain with bilateral sciatica - Plan: PT plan of care cert/re-cert  Muscle weakness (generalized) - Plan: PT plan of care cert/re-cert     Problem List Patient Active Problem List   Diagnosis Date Noted  . Acute respiratory failure with hypoxia ([place]) 10/24/2016  . Anxiety 09/26/2016  . Paroxysmal atrial fibrillation ([place]) 12/20/2014  . Chronic anticoagulation 12/20/2014  . Essential hypertension 07/20/2014  . Obesity 05/04/2014  . Chronic diastolic heart failure ([place]) 05/03/2014  . Ureteral calculus, right 05/16/2012  . Hydronephrosis of right kidney 05/16/2012  . Renal calculus, left 05/16/2012  . OA (osteoarthritis) of knee 04/13/2012    Luma Clopper PT 12/20/2017, 12:40 PM  [place], [place], [address] Phone: [phone]   Fax:  [phone]  Name: HAISLEE CORSO MRN: [id number] Date of Birth: Jun 06, 1946

## 2017-12-20 NOTE — Patient Instructions (Signed)
Bridge   Lie back, legs bent. Inhale, pressing hips up. Keeping ribs in, lengthen lower back. Exhale, rolling down along spine from top. Repeat 10-30 times. Do __2__ sessions per day.  Copyright  VHI. All rights reserved.  Abdominal bracing   Flatten back by tightening stomach muscles. Hold 5-10 seconds. Repeat _10___ times per set. Do _1-3___ sets per session. Do __2__ sessions per day.  http://orth.exer.us/134   Copyright  VHI. All rights reserved. Knee to Chest (Flexion)   Pull knee toward chest. Feel stretch in lower back or buttock area. You may need to put the other leg straight to feel a better stretch. Breathing deeply, Hold __30__ seconds. Repeat with other knee. Repeat _3___ times each leg. Do _2___ sessions per day.  http://gt2.exer.us/225     Lower Trunk Rotation Stretch   Keeping back flat and feet together, rotate knees to left side. Hold __10__ seconds. Repeat __5__ times per set. Do ____ sets per session. Do _2_ sessions per day.  http://orth.exer.us/122    [name], PT 12/20/17 11:49 AM

## 2017-12-23 DIAGNOSIS — M5136 Other intervertebral disc degeneration, lumbar region: Secondary | ICD-10-CM | POA: Diagnosis not present

## 2017-12-23 DIAGNOSIS — M9903 Segmental and somatic dysfunction of lumbar region: Secondary | ICD-10-CM | POA: Diagnosis not present

## 2017-12-23 DIAGNOSIS — M48061 Spinal stenosis, lumbar region without neurogenic claudication: Secondary | ICD-10-CM | POA: Diagnosis not present

## 2017-12-24 ENCOUNTER — Encounter (HOSPITAL_COMMUNITY): Admission: RE | Payer: Self-pay | Source: Ambulatory Visit

## 2017-12-24 ENCOUNTER — Inpatient Hospital Stay (HOSPITAL_COMMUNITY): Admission: RE | Admit: 2017-12-24 | Payer: PPO | Source: Ambulatory Visit | Admitting: Neurological Surgery

## 2017-12-24 SURGERY — ANTERIOR LATERAL LUMBAR FUSION 4 LEVELS
Anesthesia: General | Site: Back

## 2018-01-05 ENCOUNTER — Ambulatory Visit: Payer: PPO | Admitting: Physical Therapy

## 2018-01-05 ENCOUNTER — Encounter: Payer: Self-pay | Admitting: Physical Therapy

## 2018-01-05 DIAGNOSIS — G8929 Other chronic pain: Secondary | ICD-10-CM

## 2018-01-05 DIAGNOSIS — M5442 Lumbago with sciatica, left side: Principal | ICD-10-CM

## 2018-01-05 DIAGNOSIS — M5441 Lumbago with sciatica, right side: Principal | ICD-10-CM

## 2018-01-05 DIAGNOSIS — M6281 Muscle weakness (generalized): Secondary | ICD-10-CM

## 2018-01-05 NOTE — Therapy (Signed)
[place], [place], [address] Phone: [phone]   Fax:  [phone]  Physical Therapy Treatment  Patient Details  Name: [name] MRN: [id number] Date of Birth: 1945-11-28 Referring Provider: Dr. Kristeen Miss   Encounter Date: 01/05/2018  PT End of Session - 01/05/18 1055    Visit Number  2    Date for PT Re-Evaluation  03/14/18    PT Start Time  1005    PT Stop Time  1110    PT Time Calculation (min)  65 min    Activity Tolerance  Patient tolerated treatment well    Behavior During Therapy  [facility] for tasks assessed/performed       Past Medical History:  Diagnosis Date  . Acute hypoxemic respiratory failure ([place]) 10/24/2016  . Arthritis    both knees  . Atrial fibrillation, chronic ([place])   . Carpal tunnel syndrome, bilateral    both hands go to sleep at times from elbows down  . Chronic diastolic heart failure ([place])   . DDD (degenerative disc disease), lumbosacral   . Dyspnea   . First degree AV block   . Hematuria 11/15/2017  . History of kidney stones   . History of rib fracture 03/2011  . Lumbar stenosis   . Obesity   . Postlaminectomy syndrome   . Pre-diabetes   . Renal calculi    BILATERAL  . Right ureteral stone   . Rotator cuff tear, left   . Rotator cuff tear, right     Past Surgical History:  Procedure Laterality Date  . ANTERIOR CERVICAL DECOMP/DISCECTOMY FUSION  03-18-2011   C3 -- C7  . APPENDECTOMY    . BILATERAL CARPAL TUNNEL RELEASE     3 screws in right hand, thumb, middle finger, and index finger,   . CARDIAC CATHETERIZATION N/A 01/16/2015   Procedure: Left Heart Cath and Coronary Angiography;  Surgeon: Belva Crome, MD;  Location: [facility];  Service: Cardiovascular;  Laterality: N/A;  . CHOLECYSTECTOMY OPEN  1970'S  . COLONOSCOPY  2018  . CYSTOSCOPY WITH RETROGRADE PYELOGRAM, URETEROSCOPY AND STENT PLACEMENT N/A 02/13/2013   Procedure:  CYSTOSCOPY WITH RETROGRADE PYELOGRAM, URETEROSCOPY AND LITHOTRIPSY ;  Surgeon: Claybon Jabs, MD;  Location: [facility];  Service: Urology;  Laterality: N/A;  . CYSTOSCOPY/URETEROSCOPY/HOLMIUM LASER/STENT PLACEMENT Right 11/22/2017   Procedure: CYSTOSCOPY/URETEROSCOPY/RETROGRADE PYELOGRAM/HOLMIUM LASER/STENT PLACEMENT;  Surgeon: Kathie Rhodes, MD;  Location: [facility];  Service: Urology;  Laterality: Right;  . EXTRACORPOREAL SHOCK WAVE LITHOTRIPSY Right 05-16-2012  . FINGER ARTHROSCOPY WITH CARPOMETACARPEL (CMC) ARTHROPLASTY    . HOLMIUM LASER APPLICATION Right 01/12/2201   Procedure: HOLMIUM LASER APPLICATION;  Surgeon: Claybon Jabs, MD;  Location: [facility];  Service: Urology;  Laterality: Right;  . KNEE ARTHROSCOPY Bilateral   . LUMBAR LAMINECTOMY  X2  1960'S  . NASAL SEPTUM SURGERY  1970'S  . SHOULDER OPEN ROTATOR CUFF REPAIR Bilateral LEFT 01-11-2001/   RIGHT 07-21-2002  . TOTAL KNEE ARTHROPLASTY  04/13/2012   Procedure: TOTAL KNEE ARTHROPLASTY;  Surgeon: Gearlean Alf, MD;  Location[facility];  Service: Orthopedics;  Laterality: Left;  . TOTAL KNEE ARTHROPLASTY  08/08/2012   Procedure: TOTAL KNEE ARTHROPLASTY;  Surgeon: Gearlean Alf, MD;  Location: [facility];  Service: Orthopedics;  Laterality: Right;  . TRANSTHORACIC ECHOCARDIOGRAM  03-29-2011   MODERATE LVH/ EF 60-65%/ MILD MR  . VAGINAL HYSTERECTOMY  1960'S  There were no vitals filed for this visit.  Subjective Assessment - 01/05/18 1010    Subjective  Patient reports that she had to cancel the back surgery due to insurance requiring her to attend PT and or chiropractic care before surgery was approved.    Currently in Pain?  Yes    Pain Score  8     Pain Location  Back    Pain Orientation  Lower    Aggravating Factors   activity    Pain Relieving Factors  pain meds oxycodone                       [facility] Adult PT Treatment/Exercise - 01/05/18 0001       Exercises   Exercises  Lumbar      Lumbar Exercises: Aerobic   Nustep  level 3 x 6 minutes      Lumbar Exercises: Standing   Other Standing Lumbar Exercises  2.5# hip abduction      Lumbar Exercises: Seated   Long Arc Quad on Chair  2 sets;10 reps;Weights    LAQ on Chair Weights (lbs)  2.5#    Other Seated Lumbar Exercises  2.5# marching 2x10, pelvic clock     Other Seated Lumbar Exercises  ball squeeze, isometric abdominal ball squeeze, t-band scapular stabilization, education on condition and how PT may help with flexibility and core strength      Modalities   Modalities  Electrical Stimulation;Moist Heat      Moist Heat Therapy   Number Minutes Moist Heat  15 Minutes    Moist Heat Location  Lumbar Spine      Electrical Stimulation   Electrical Stimulation Location  lumbarspine    Electrical Stimulation Action  IFC    Electrical Stimulation Parameters  sitting    Electrical Stimulation Goals  Pain               PT Short Term Goals - 01/05/18 1057      PT SHORT TERM GOAL #1   Title  I with HEP    Status  On-going        PT Long Term Goals - 12/20/17 1224      PT LONG TERM GOAL #1   Title  I with advanced HEP    Time  8    Period  Weeks    Status  New      PT LONG TERM GOAL #2   Title  Patient able to bridge in bed to improve bed mobility.    Time  8    Period  Weeks    Status  New      PT LONG TERM GOAL #3   Title  Patient to demo improved BLE strength to 4/5 or better to normalize gait and ADLS    Time  8    Period  Weeks    Status  New      PT LONG TERM GOAL #4   Title  Patient to report decreased pain with ADLs by 50%.    Time  8    Period  Weeks    Status  New      PT LONG TERM GOAL #5   Title  Patient able to stand and walk for 15 min or more without needing to sit due to pain.    Time  8    Period  Weeks    Status  New    Target Date  --  Plan - 01/05/18 1055    Clinical Impression Statement  Patient able to  tolerate the exercises without much c/o pain, she does have severely limited ROM of the right shoulder and appears to have decreased deltoid activiation and the mid upper lateral arm seems to be bigger and she is sore here, she reports two past RC repairs years ago.    PT Next Visit Plan  ROM, strengthening, pain control    Consulted and Agree with Plan of Care  Patient       Patient will benefit from skilled therapeutic intervention in order to improve the following deficits and impairments:  Abnormal gait, Decreased range of motion, Difficulty walking, Decreased activity tolerance, Pain, Decreased balance, Decreased strength, Postural dysfunction, Impaired flexibility  Visit Diagnosis: Chronic bilateral low back pain with bilateral sciatica  Muscle weakness (generalized)     Problem List Patient Active Problem List   Diagnosis Date Noted  . Acute respiratory failure with hypoxia ([place]) 10/24/2016  . Anxiety 09/26/2016  . Paroxysmal atrial fibrillation ([place]) 12/20/2014  . Chronic anticoagulation 12/20/2014  . Essential hypertension 07/20/2014  . Obesity 05/04/2014  . Chronic diastolic heart failure ([place]) 05/03/2014  . Ureteral calculus, right 05/16/2012  . Hydronephrosis of right kidney 05/16/2012  . Renal calculus, left 05/16/2012  . OA (osteoarthritis) of knee 04/13/2012    Sumner Boast., PT 01/05/2018, 10:58 AM  [place] [address] [place], [place], [address] Phone: [phone]   Fax:  [phone]  Name: [name] MRN: [id number] Date of Birth: [birth year]

## 2018-01-19 ENCOUNTER — Encounter: Payer: Self-pay | Admitting: Physical Therapy

## 2018-01-19 ENCOUNTER — Ambulatory Visit: Payer: PPO | Attending: Neurological Surgery | Admitting: Physical Therapy

## 2018-01-19 DIAGNOSIS — M5441 Lumbago with sciatica, right side: Secondary | ICD-10-CM | POA: Insufficient documentation

## 2018-01-19 DIAGNOSIS — M5442 Lumbago with sciatica, left side: Secondary | ICD-10-CM | POA: Insufficient documentation

## 2018-01-19 DIAGNOSIS — G8929 Other chronic pain: Secondary | ICD-10-CM | POA: Insufficient documentation

## 2018-01-19 DIAGNOSIS — M6281 Muscle weakness (generalized): Secondary | ICD-10-CM | POA: Insufficient documentation

## 2018-01-19 NOTE — Therapy (Signed)
[place], [place], [address] Phone: [phone]   Fax:  [phone]  Physical Therapy Treatment  Patient Details  Name: [name] MRN: [id number] Date of Birth: 24-Jun-1946 Referring Provider: Dr. Kristeen Miss   Encounter Date: 01/19/2018  PT End of Session - 01/19/18 0925    Visit Number  3    Date for PT Re-Evaluation  03/14/18    PT Start Time  0844    PT Stop Time  0942    PT Time Calculation (min)  58 min    Activity Tolerance  Patient tolerated treatment well    Behavior During Therapy  [facility] for tasks assessed/performed       Past Medical History:  Diagnosis Date  . Acute hypoxemic respiratory failure ([place]) 10/24/2016  . Arthritis    both knees  . Atrial fibrillation, chronic ([place])   . Carpal tunnel syndrome, bilateral    both hands go to sleep at times from elbows down  . Chronic diastolic heart failure ([place])   . DDD (degenerative disc disease), lumbosacral   . Dyspnea   . First degree AV block   . Hematuria 11/15/2017  . History of kidney stones   . History of rib fracture 03/2011  . Lumbar stenosis   . Obesity   . Postlaminectomy syndrome   . Pre-diabetes   . Renal calculi    BILATERAL  . Right ureteral stone   . Rotator cuff tear, left   . Rotator cuff tear, right     Past Surgical History:  Procedure Laterality Date  . ANTERIOR CERVICAL DECOMP/DISCECTOMY FUSION  03-18-2011   C3 -- C7  . APPENDECTOMY    . BILATERAL CARPAL TUNNEL RELEASE     3 screws in right hand, thumb, middle finger, and index finger,   . CARDIAC CATHETERIZATION N/A 01/16/2015   Procedure: Left Heart Cath and Coronary Angiography;  Surgeon: Belva Crome, MD;  Location: [facility];  Service: Cardiovascular;  Laterality: N/A;  . CHOLECYSTECTOMY OPEN  1970'S  . COLONOSCOPY  2018  . CYSTOSCOPY WITH RETROGRADE PYELOGRAM, URETEROSCOPY AND STENT PLACEMENT N/A 02/13/2013   Procedure:  CYSTOSCOPY WITH RETROGRADE PYELOGRAM, URETEROSCOPY AND LITHOTRIPSY ;  Surgeon: Claybon Jabs, MD;  Location: [facility];  Service: Urology;  Laterality: N/A;  . CYSTOSCOPY/URETEROSCOPY/HOLMIUM LASER/STENT PLACEMENT Right 11/22/2017   Procedure: CYSTOSCOPY/URETEROSCOPY/RETROGRADE PYELOGRAM/HOLMIUM LASER/STENT PLACEMENT;  Surgeon: Kathie Rhodes, MD;  Location[facility]  Service: Urology;  Laterality: Right;  . EXTRACORPOREAL SHOCK WAVE LITHOTRIPSY Right 05-16-2012  . FINGER ARTHROSCOPY WITH CARPOMETACARPEL (CMC) ARTHROPLASTY    . HOLMIUM LASER APPLICATION Right 02/11/4234   Procedure: HOLMIUM LASER APPLICATION;  Surgeon: Claybon Jabs, MD;  Location: [facility];  Service: Urology;  Laterality: Right;  . KNEE ARTHROSCOPY Bilateral   . LUMBAR LAMINECTOMY  X2  1960'S  . NASAL SEPTUM SURGERY  1970'S  . SHOULDER OPEN ROTATOR CUFF REPAIR Bilateral LEFT 01-11-2001/   RIGHT 07-21-2002  . TOTAL KNEE ARTHROPLASTY  04/13/2012   Procedure: TOTAL KNEE ARTHROPLASTY;  Surgeon: Gearlean Alf, MD;  Location: [facility];  Service: Orthopedics;  Laterality: Left;  . TOTAL KNEE ARTHROPLASTY  08/08/2012   Procedure: TOTAL KNEE ARTHROPLASTY;  Surgeon: Gearlean Alf, MD;  Location: [facility];  Service: Orthopedics;  Laterality: Right;  . TRANSTHORACIC ECHOCARDIOGRAM  03-29-2011   MODERATE LVH/ EF 60-65%/ MILD MR  . VAGINAL HYSTERECTOMY  1960'S  There were no vitals filed for this visit.  Subjective Assessment - 01/19/18 0849    Subjective  Patient reports that she did okay after the initiation of exercises     Currently in Pain?  Yes    Pain Score  6     Pain Location  Back    Pain Orientation  Lower    Aggravating Factors   standing, activity                       [facility] Adult PT Treatment/Exercise - 01/19/18 0001      Lumbar Exercises: Stretches   Passive Hamstring Stretch  3 reps;10 seconds    Piriformis Stretch  3 reps;10 seconds       Lumbar Exercises: Aerobic   Nustep  level 3 x 6 minutes      Lumbar Exercises: Standing   Other Standing Lumbar Exercises  2.5# hip abduction and extension      Lumbar Exercises: Seated   Long Arc Quad on Chair  2 sets;10 reps;Weights    Other Seated Lumbar Exercises  2.5# marching 2x10, pelvic clock     Other Seated Lumbar Exercises  ball squeeze, isometric abdominal ball squeeze, t-band scapular stabilization, education on condition and how PT may help with flexibility and core strength, red tband HS curls      Modalities   Modalities  Electrical Stimulation;Moist Heat      Moist Heat Therapy   Number Minutes Moist Heat  15 Minutes    Moist Heat Location  Lumbar Spine      Electrical Stimulation   Electrical Stimulation Location  lumbarspine    Electrical Stimulation Action  IFC    Electrical Stimulation Parameters  sitting    Electrical Stimulation Goals  Pain               PT Short Term Goals - 01/19/18 0926      PT SHORT TERM GOAL #1   Title  I with HEP    Status  Achieved        PT Long Term Goals - 01/19/18 0926      PT LONG TERM GOAL #2   Title  Patient able to bridge in bed to improve bed mobility.    Status  On-going            Plan - 01/19/18 0925    Clinical Impression Statement  Some pain with hip extension, needs cues to keep posture correct, she does fatigue easily and the LE's are weak    PT Next Visit Plan  ROM, strengthening, pain control    Consulted and Agree with Plan of Care  Patient       Patient will benefit from skilled therapeutic intervention in order to improve the following deficits and impairments:  Abnormal gait, Decreased range of motion, Difficulty walking, Decreased activity tolerance, Pain, Decreased balance, Decreased strength, Postural dysfunction, Impaired flexibility  Visit Diagnosis: Chronic bilateral low back pain with bilateral sciatica  Muscle weakness (generalized)     Problem List Patient Active  Problem List   Diagnosis Date Noted  . Acute respiratory failure with hypoxia ([place]) 10/24/2016  . Anxiety 09/26/2016  . Paroxysmal atrial fibrillation ([place]) 12/20/2014  . Chronic anticoagulation 12/20/2014  . Essential hypertension 07/20/2014  . Obesity 05/04/2014  . Chronic diastolic heart failure ([place]) 05/03/2014  . Ureteral calculus, right 05/16/2012  . Hydronephrosis of right kidney 05/16/2012  . Renal calculus, left 05/16/2012  . OA (osteoarthritis)  of knee 04/13/2012    Sumner Boast., PT 01/19/2018, 9:27 AM  [place], [place], [address] Phone: [phone]   Fax:  [phone]  Name: [name] MRN: [id number] Date of Birth: 05/28/1946

## 2018-01-21 DIAGNOSIS — H2511 Age-related nuclear cataract, right eye: Secondary | ICD-10-CM | POA: Diagnosis not present

## 2018-01-24 ENCOUNTER — Ambulatory Visit: Payer: PPO | Admitting: Physical Therapy

## 2018-01-24 ENCOUNTER — Encounter: Payer: Self-pay | Admitting: Physical Therapy

## 2018-01-24 DIAGNOSIS — G8929 Other chronic pain: Secondary | ICD-10-CM

## 2018-01-24 DIAGNOSIS — M6281 Muscle weakness (generalized): Secondary | ICD-10-CM

## 2018-01-24 DIAGNOSIS — M5442 Lumbago with sciatica, left side: Secondary | ICD-10-CM | POA: Diagnosis not present

## 2018-01-24 DIAGNOSIS — M5441 Lumbago with sciatica, right side: Principal | ICD-10-CM

## 2018-01-24 NOTE — Therapy (Signed)
[facility] [address] [place], [place], [address] Phone: [phone]   Fax:  [phone]  Physical Therapy Treatment  Patient Details  Name: [name] MRN: [id number] Date of Birth: 10/02/1945 Referring Provider: Dr. Henry Elsner   Encounter Date: 01/24/2018  PT End of Session - 01/24/18 1044    Visit Number  4    Date for PT Re-Evaluation  03/14/18    PT Start Time  1014    PT Stop Time  1112    PT Time Calculation (min)  58 min    Activity Tolerance  Patient tolerated treatment well    Behavior During Therapy  [facility] for tasks assessed/performed       Past Medical History:  Diagnosis Date  . Acute hypoxemic respiratory failure ([facility]) 10/24/2016  . Arthritis    both knees  . Atrial fibrillation, chronic ([facility])   . Carpal tunnel syndrome, bilateral    both hands go to sleep at times from elbows down  . Chronic diastolic heart failure ([facility])   . DDD (degenerative disc disease), lumbosacral   . Dyspnea   . First degree AV block   . Hematuria 11/15/2017  . History of kidney stones   . History of rib fracture 03/2011  . Lumbar stenosis   . Obesity   . Postlaminectomy syndrome   . Pre-diabetes   . Renal calculi    BILATERAL  . Right ureteral stone   . Rotator cuff tear, left   . Rotator cuff tear, right     Past Surgical History:  Procedure Laterality Date  . ANTERIOR CERVICAL DECOMP/DISCECTOMY FUSION  03-18-2011   C3 -- C7  . APPENDECTOMY    . BILATERAL CARPAL TUNNEL RELEASE     3 screws in right hand, thumb, middle finger, and index finger,   . CARDIAC CATHETERIZATION N/A 01/16/2015   Procedure: Left Heart Cath and Coronary Angiography;  Surgeon: Henry W Smith, MD;  [facility];  Service: Cardiovascular;  Laterality: N/A;  . CHOLECYSTECTOMY OPEN  1970'S  . COLONOSCOPY  2018  . CYSTOSCOPY WITH RETROGRADE PYELOGRAM, URETEROSCOPY AND STENT PLACEMENT N/A 02/13/2013   Procedure:  CYSTOSCOPY WITH RETROGRADE PYELOGRAM, URETEROSCOPY AND LITHOTRIPSY ;  Surgeon: Mark C Ottelin, MD;  Location: [facility];  Service: Urology;  Laterality: N/A;  . CYSTOSCOPY/URETEROSCOPY/HOLMIUM LASER/STENT PLACEMENT Right 11/22/2017   Procedure: CYSTOSCOPY/URETEROSCOPY/RETROGRADE PYELOGRAM/HOLMIUM LASER/STENT PLACEMENT;  Surgeon: Ottelin, Mark, MD;  Location: [facility];  Service: Urology;  Laterality: Right;  . EXTRACORPOREAL SHOCK WAVE LITHOTRIPSY Right 05-16-2012  . FINGER ARTHROSCOPY WITH CARPOMETACARPEL (CMC) ARTHROPLASTY    . HOLMIUM LASER APPLICATION Right 02/13/2013   Procedure: HOLMIUM LASER APPLICATION;  Surgeon: Mark C Ottelin, MD;  Location: [facility];  Service: Urology;  Laterality: Right;  . KNEE ARTHROSCOPY Bilateral   . LUMBAR LAMINECTOMY  X2  1960'S  . NASAL SEPTUM SURGERY  1970'S  . SHOULDER OPEN ROTATOR CUFF REPAIR Bilateral LEFT 01-11-2001/   RIGHT 07-21-2002  . TOTAL KNEE ARTHROPLASTY  04/13/2012   Procedure: TOTAL KNEE ARTHROPLASTY;  Surgeon: Frank V Aluisio, MD;  [facility];  Service: Orthopedics;  Laterality: Left;  . TOTAL KNEE ARTHROPLASTY  08/08/2012   Procedure: TOTAL KNEE ARTHROPLASTY;  Surgeon: Frank V Aluisio, MD;  [facility];  Service: Orthopedics;  Laterality: Right;  . TRANSTHORACIC ECHOCARDIOGRAM  03-29-2011   MODERATE LVH/ EF 60-65%/ MILD MR  . VAGINAL HYSTERECTOMY  1960'S      There were no vitals filed for this visit.  Subjective Assessment - 01/24/18 1012    Subjective  Patient reports that she feels better when she leaves here.  Reports that the pain is always there    Currently in Pain?  Yes    Pain Score  5     Pain Location  Back    Pain Orientation  Lower;Right    Aggravating Factors   worse with walking and standing                       [facility] Adult PT Treatment/Exercise - 01/24/18 0001      Ambulation/Gait   Gait Comments  gait 240 feet no device, has a significant  right trendelenberg      Lumbar Exercises: Stretches   Passive Hamstring Stretch  3 reps;10 seconds    Piriformis Stretch  3 reps;10 seconds      Lumbar Exercises: Aerobic   UBE (Upper Arm Bike)  level 4 x 5 minutes    Nustep  level 4 x 6 minutes      Lumbar Exercises: Seated   Long Arc Quad on Chair  2 sets;10 reps;Weights    Other Seated Lumbar Exercises  2.5# marching 2x10, pelvic clock     Other Seated Lumbar Exercises  ball squeeze, isometric abdominal ball squeeze, t-band scapular stabilization, education on condition and how PT may help with flexibility and core strength, red tband HS curls, core rotaiton with red tband      Lumbar Exercises: Supine   Other Supine Lumbar Exercises  feet on ball K2C, trunk rotation and small bridges      Modalities   Modalities  Electrical Stimulation;Moist Heat      Moist Heat Therapy   Number Minutes Moist Heat  15 Minutes    Moist Heat Location  Lumbar Spine      Electrical Stimulation   Electrical Stimulation Location  lumbarspine    Electrical Stimulation Action  IFC    Electrical Stimulation Parameters  sitting    Electrical Stimulation Goals  Pain               PT Short Term Goals - 01/19/18 0926      PT SHORT TERM GOAL #1   Title  I with HEP    Status  Achieved        PT Long Term Goals - 01/24/18 1047      PT LONG TERM GOAL #1   Title  I with advanced HEP    Status  On-going      PT LONG TERM GOAL #2   Title  Patient able to bridge in bed to improve bed mobility.    Status  Partially Met      PT LONG TERM GOAL #3   Title  Patient to demo improved BLE strength to 4/5 or better to normalize gait and ADLS    Status  On-going      PT LONG TERM GOAL #4   Title  Patient to report decreased pain with ADLs by 50%.    Status  On-going      PT LONG TERM GOAL #5   Title  Patient able to stand and walk for 15 min or more without needing to sit due to pain.    Status  On-going            Plan - 01/24/18  1044    Clinical Impression Statement  Patient reported "you  turned it up today"  reports that she did not have increased pain but reports that she "feels like the mms worked".  She reports that she had the most difficulty with the bridges    PT Next Visit Plan  continue to work on core strength    Consulted and Agree with Plan of Care  Patient       Patient will benefit from skilled therapeutic intervention in order to improve the following deficits and impairments:  Abnormal gait, Decreased range of motion, Difficulty walking, Decreased activity tolerance, Pain, Decreased balance, Decreased strength, Postural dysfunction, Impaired flexibility  Visit Diagnosis: Chronic bilateral low back pain with bilateral sciatica  Muscle weakness (generalized)     Problem List Patient Active Problem List   Diagnosis Date Noted  . Acute respiratory failure with hypoxia ([facility]) 10/24/2016  . Anxiety 09/26/2016  . Paroxysmal atrial fibrillation ([facility]) 12/20/2014  . Chronic anticoagulation 12/20/2014  . Essential hypertension 07/20/2014  . Obesity 05/04/2014  . Chronic diastolic heart failure ([facility]) 05/03/2014  . Ureteral calculus, right 05/16/2012  . Hydronephrosis of right kidney 05/16/2012  . Renal calculus, left 05/16/2012  . OA (osteoarthritis) of knee 04/13/2012    ALBRIGHT,MICHAEL W., PT 01/24/2018, 10:48 AM  [facility] [address] [place], [place], [address] Phone: [phone]   Fax:  [phone]  Name: [name] MRN: [id number] Date of Birth: 12/30/1945   

## 2018-01-28 DIAGNOSIS — H2511 Age-related nuclear cataract, right eye: Secondary | ICD-10-CM | POA: Diagnosis not present

## 2018-01-28 DIAGNOSIS — H25811 Combined forms of age-related cataract, right eye: Secondary | ICD-10-CM | POA: Diagnosis not present

## 2018-01-31 ENCOUNTER — Encounter: Payer: Self-pay | Admitting: Physical Therapy

## 2018-01-31 ENCOUNTER — Ambulatory Visit: Payer: PPO | Admitting: Physical Therapy

## 2018-01-31 DIAGNOSIS — M5441 Lumbago with sciatica, right side: Principal | ICD-10-CM

## 2018-01-31 DIAGNOSIS — M6281 Muscle weakness (generalized): Secondary | ICD-10-CM

## 2018-01-31 DIAGNOSIS — M5442 Lumbago with sciatica, left side: Secondary | ICD-10-CM | POA: Diagnosis not present

## 2018-01-31 DIAGNOSIS — G8929 Other chronic pain: Secondary | ICD-10-CM

## 2018-01-31 NOTE — Therapy (Signed)
[place], [place], [address] Phone: [phone]   Fax:  [phone]  Physical Therapy Treatment  Patient Details  Name: [name] MRN: [id number] Date of Birth: 1945-11-20 Referring Provider: Dr. Kristeen Miss   Encounter Date: 01/31/2018  PT End of Session - 01/31/18 1053    Visit Number  5    Date for PT Re-Evaluation  03/14/18    PT Start Time  1008    PT Stop Time  1108    PT Time Calculation (min)  60 min    Activity Tolerance  Patient tolerated treatment well    Behavior During Therapy  [facility] for tasks assessed/performed       Past Medical History:  Diagnosis Date  . Acute hypoxemic respiratory failure ([place]) 10/24/2016  . Arthritis    both knees  . Atrial fibrillation, chronic ([place])   . Carpal tunnel syndrome, bilateral    both hands go to sleep at times from elbows down  . Chronic diastolic heart failure ([place])   . DDD (degenerative disc disease), lumbosacral   . Dyspnea   . First degree AV block   . Hematuria 11/15/2017  . History of kidney stones   . History of rib fracture 03/2011  . Lumbar stenosis   . Obesity   . Postlaminectomy syndrome   . Pre-diabetes   . Renal calculi    BILATERAL  . Right ureteral stone   . Rotator cuff tear, left   . Rotator cuff tear, right     Past Surgical History:  Procedure Laterality Date  . ANTERIOR CERVICAL DECOMP/DISCECTOMY FUSION  03-18-2011   C3 -- C7  . APPENDECTOMY    . BILATERAL CARPAL TUNNEL RELEASE     3 screws in right hand, thumb, middle finger, and index finger,   . CARDIAC CATHETERIZATION N/A 01/16/2015   Procedure: Left Heart Cath and Coronary Angiography;  Surgeon: Belva Crome, MD;  Location: [facility];  Service: Cardiovascular;  Laterality: N/A;  . CHOLECYSTECTOMY OPEN  1970'S  . COLONOSCOPY  2018  . CYSTOSCOPY WITH RETROGRADE PYELOGRAM, URETEROSCOPY AND STENT PLACEMENT N/A 02/13/2013   Procedure:  CYSTOSCOPY WITH RETROGRADE PYELOGRAM, URETEROSCOPY AND LITHOTRIPSY ;  Surgeon: Claybon Jabs, MD;  Location: [facility];  Service: Urology;  Laterality: N/A;  . CYSTOSCOPY/URETEROSCOPY/HOLMIUM LASER/STENT PLACEMENT Right 11/22/2017   Procedure: CYSTOSCOPY/URETEROSCOPY/RETROGRADE PYELOGRAM/HOLMIUM LASER/STENT PLACEMENT;  Surgeon: Kathie Rhodes, MD;  Location: [facility];  Service: Urology;  Laterality: Right;  . EXTRACORPOREAL SHOCK WAVE LITHOTRIPSY Right 05-16-2012  . FINGER ARTHROSCOPY WITH CARPOMETACARPEL (CMC) ARTHROPLASTY    . HOLMIUM LASER APPLICATION Right 12/19/6293   Procedure: HOLMIUM LASER APPLICATION;  Surgeon: Claybon Jabs, MD;  [facility]: [facility];  Service: Urology;  Laterality: Right;  . KNEE ARTHROSCOPY Bilateral   . LUMBAR LAMINECTOMY  X2  1960'S  . NASAL SEPTUM SURGERY  1970'S  . SHOULDER OPEN ROTATOR CUFF REPAIR Bilateral LEFT 01-11-2001/   RIGHT 07-21-2002  . TOTAL KNEE ARTHROPLASTY  04/13/2012   Procedure: TOTAL KNEE ARTHROPLASTY;  Surgeon: Gearlean Alf, MD;  [facility];  Service: Orthopedics;  Laterality: Left;  . TOTAL KNEE ARTHROPLASTY  08/08/2012   Procedure: TOTAL KNEE ARTHROPLASTY;  Surgeon: Gearlean Alf, MD;  [facility];  Service: Orthopedics;  Laterality: Right;  . TRANSTHORACIC ECHOCARDIOGRAM  03-29-2011   MODERATE LVH/ EF 60-65%/ MILD MR  . VAGINAL HYSTERECTOMY  1960'S  There were no vitals filed for this visit.  Subjective Assessment - 01/31/18 1009    Subjective  reports "about the same"    Currently in Pain?  Yes    Pain Score  4     Pain Location  Back    Pain Orientation  Right;Lower    Aggravating Factors   standing                       [facility] Adult PT Treatment/Exercise - 01/31/18 0001      Ambulation/Gait   Gait Comments  gait 240 feet no device, has a significant right trendelenberg, able to get to the front door without much wobble but on the way back much more  difficult      Lumbar Exercises: Stretches   Passive Hamstring Stretch  10 seconds;4 reps    Piriformis Stretch  3 reps;20 seconds      Lumbar Exercises: Aerobic   UBE (Upper Arm Bike)  level 4 x 5 minutes    Nustep  level 5 x 6 minutes      Lumbar Exercises: Machines for Strengthening   Cybex Knee Extension  5# 2x10    Cybex Knee Flexion  20# 2x10      Lumbar Exercises: Standing   Other Standing Lumbar Exercises  2.5# hip abduction and extension and flexion 2x10 each      Lumbar Exercises: Seated   Other Seated Lumbar Exercises  2.5# marching 2x10, pelvic clock       Lumbar Exercises: Supine   Other Supine Lumbar Exercises  feet on ball K2C, trunk rotation and small bridges      Modalities   Modalities  Electrical Stimulation;Moist Heat      Moist Heat Therapy   Number Minutes Moist Heat  15 Minutes    Moist Heat Location  Lumbar Spine      Electrical Stimulation   Electrical Stimulation Location  lumbarspine    Electrical Stimulation Action  IFC    Electrical Stimulation Parameters  sitting    Electrical Stimulation Goals  Pain             PT Education - 01/31/18 1053    Education Details  asked her to do some bridging and hip extension and abduction at home to help with her gait    Person(s) Educated  Patient    Methods  Explanation    Comprehension  Verbalized understanding       PT Short Term Goals - 01/19/18 0926      PT SHORT TERM GOAL #1   Title  I with HEP    Status  Achieved        PT Long Term Goals - 01/24/18 1047      PT LONG TERM GOAL #1   Title  I with advanced HEP    Status  On-going      PT LONG TERM GOAL #2   Title  Patient able to bridge in bed to improve bed mobility.    Status  Partially Met      PT LONG TERM GOAL #3   Title  Patient to demo improved BLE strength to 4/5 or better to normalize gait and ADLS    Status  On-going      PT LONG TERM GOAL #4   Title  Patient to report decreased pain with ADLs by 50%.    Status   On-going      PT LONG TERM GOAL #5  Title  Patient able to stand and walk for 15 min or more without needing to sit due to pain.    Status  On-going            Plan - 01/31/18 1054    Clinical Impression Statement  Patient did well walking to the front door ~ 120' without deviation, on the way back much more wobble and c/o fatigue int he back and the legs, tight left piriformis with radicular symptoms during stretches    PT Next Visit Plan  continue to work on core strength    Consulted and Agree with Plan of Care  Patient       Patient will benefit from skilled therapeutic intervention in order to improve the following deficits and impairments:  Abnormal gait, Decreased range of motion, Difficulty walking, Decreased activity tolerance, Pain, Decreased balance, Decreased strength, Postural dysfunction, Impaired flexibility  Visit Diagnosis: Chronic bilateral low back pain with bilateral sciatica  Muscle weakness (generalized)     Problem List Patient Active Problem List   Diagnosis Date Noted  . Acute respiratory failure with hypoxia ([place]) 10/24/2016  . Anxiety 09/26/2016  . Paroxysmal atrial fibrillation ([place]) 12/20/2014  . Chronic anticoagulation 12/20/2014  . Essential hypertension 07/20/2014  . Obesity 05/04/2014  . Chronic diastolic heart failure ([place]) 05/03/2014  . Ureteral calculus, right 05/16/2012  . Hydronephrosis of right kidney 05/16/2012  . Renal calculus, left 05/16/2012  . OA (osteoarthritis) of knee 04/13/2012    Sumner Boast., PT 01/31/2018, 10:55 AM  [place], [place], [address] Phone: [phone]   Fax:  [phone]  Name: LANYAH SPENGLER MRN: [id number] Date of Birth: [birth year]

## 2018-02-08 ENCOUNTER — Encounter: Payer: Self-pay | Admitting: Physical Therapy

## 2018-02-08 ENCOUNTER — Ambulatory Visit: Payer: PPO | Admitting: Physical Therapy

## 2018-02-08 DIAGNOSIS — M5442 Lumbago with sciatica, left side: Principal | ICD-10-CM

## 2018-02-08 DIAGNOSIS — M6281 Muscle weakness (generalized): Secondary | ICD-10-CM

## 2018-02-08 DIAGNOSIS — G8929 Other chronic pain: Secondary | ICD-10-CM

## 2018-02-08 DIAGNOSIS — M5441 Lumbago with sciatica, right side: Principal | ICD-10-CM

## 2018-02-08 NOTE — Therapy (Signed)
[place], [place], [address] Phone: [phone]   Fax:  [phone]  Physical Therapy Treatment  Patient Details  Name: [name] MRN: [id number] Date of Birth: 1945/12/27 Referring Provider: Dr. Kristeen Miss   Encounter Date: 02/08/2018  PT End of Session - 02/08/18 1041    Visit Number  6    Date for PT Re-Evaluation  03/14/18    PT Start Time  1006    PT Stop Time  1105    PT Time Calculation (min)  59 min    Activity Tolerance  Patient tolerated treatment well    Behavior During Therapy  [facility] for tasks assessed/performed       Past Medical History:  Diagnosis Date  . Acute hypoxemic respiratory failure ([place]) 10/24/2016  . Arthritis    both knees  . Atrial fibrillation, chronic ([place])   . Carpal tunnel syndrome, bilateral    both hands go to sleep at times from elbows down  . Chronic diastolic heart failure ([place])   . DDD (degenerative disc disease), lumbosacral   . Dyspnea   . First degree AV block   . Hematuria 11/15/2017  . History of kidney stones   . History of rib fracture 03/2011  . Lumbar stenosis   . Obesity   . Postlaminectomy syndrome   . Pre-diabetes   . Renal calculi    BILATERAL  . Right ureteral stone   . Rotator cuff tear, left   . Rotator cuff tear, right     Past Surgical History:  Procedure Laterality Date  . ANTERIOR CERVICAL DECOMP/DISCECTOMY FUSION  03-18-2011   C3 -- C7  . APPENDECTOMY    . BILATERAL CARPAL TUNNEL RELEASE     3 screws in right hand, thumb, middle finger, and index finger,   . CARDIAC CATHETERIZATION N/A 01/16/2015   Procedure: Left Heart Cath and Coronary Angiography;  Surgeon: Belva Crome, MD;  Location: [place] [facility];  Service: Cardiovascular;  Laterality: N/A;  . CHOLECYSTECTOMY OPEN  1970'S  . COLONOSCOPY  2018  . CYSTOSCOPY WITH RETROGRADE PYELOGRAM, URETEROSCOPY AND STENT PLACEMENT N/A 02/13/2013   Procedure:  CYSTOSCOPY WITH RETROGRADE PYELOGRAM, URETEROSCOPY AND LITHOTRIPSY ;  Surgeon: Claybon Jabs, MD;  Location: [facility];  Service: Urology;  Laterality: N/A;  . CYSTOSCOPY/URETEROSCOPY/HOLMIUM LASER/STENT PLACEMENT Right 11/22/2017   Procedure: CYSTOSCOPY/URETEROSCOPY/RETROGRADE PYELOGRAM/HOLMIUM LASER/STENT PLACEMENT;  Surgeon: Kathie Rhodes, MD;  Location: [facility];  Service: Urology;  Laterality: Right;  . EXTRACORPOREAL SHOCK WAVE LITHOTRIPSY Right 05-16-2012  . FINGER ARTHROSCOPY WITH CARPOMETACARPEL (CMC) ARTHROPLASTY    . HOLMIUM LASER APPLICATION Right 07/15/2438   Procedure: HOLMIUM LASER APPLICATION;  Surgeon: Claybon Jabs, MD;  Location: [facility];  Service: Urology;  Laterality: Right;  . KNEE ARTHROSCOPY Bilateral   . LUMBAR LAMINECTOMY  X2  1960'S  . NASAL SEPTUM SURGERY  1970'S  . SHOULDER OPEN ROTATOR CUFF REPAIR Bilateral LEFT 01-11-2001/   RIGHT 07-21-2002  . TOTAL KNEE ARTHROPLASTY  04/13/2012   Procedure: TOTAL KNEE ARTHROPLASTY;  Surgeon: Gearlean Alf, MD;  [facility];  Service: Orthopedics;  Laterality: Left;  . TOTAL KNEE ARTHROPLASTY  08/08/2012   Procedure: TOTAL KNEE ARTHROPLASTY;  Surgeon: Gearlean Alf, MD;  [facility]: [facility];  Service: Orthopedics;  Laterality: Right;  . TRANSTHORACIC ECHOCARDIOGRAM  03-29-2011   MODERATE LVH/ EF 60-65%/ MILD MR  . VAGINAL HYSTERECTOMY  1960'S  There were no vitals filed for this visit.  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  REports that she is still having difficulty moving today    Currently in Pain?  Yes    Pain Score  6     Pain Location  Back    Pain Orientation  Lower    Pain Descriptors /  Indicators  Aching;Sore    Aggravating Factors   standing                       [facility] Adult PT Treatment/Exercise - 02/08/18 0001      Lumbar Exercises: Stretches   Passive Hamstring Stretch  10 seconds;4 reps      Lumbar Exercises: Aerobic   UBE (Upper Arm Bike)  level 4 x 5 minutes    Nustep  level 5 x 6 minutes      Lumbar Exercises: Machines for Strengthening   Cybex Knee Extension  5# 2x10    Cybex Knee Flexion  20# 2x10      Lumbar Exercises: Standing   Other Standing Lumbar Exercises  2.5# hip abduction and extension and flexion 2x10 each      Lumbar Exercises: Seated   Other Seated Lumbar Exercises  2.5# marching 2x10,     Other Seated Lumbar Exercises  ball squeeze, isometric abdominal ball squeeze, t-band scapular stabilization, education on condition and how PT may help with flexibility and core strength, red tband HS curls,      Lumbar Exercises: Supine   Other Supine Lumbar Exercises  feet on ball K2C, trunk rotation and small bridges      Modalities   Modalities  Electrical Stimulation;Moist Heat      Moist Heat Therapy   Number Minutes Moist Heat  15 Minutes    Moist Heat Location  Lumbar Spine      Electrical Stimulation   Electrical Stimulation Location  lumbarspine    Electrical Stimulation Action  IFC    Electrical Stimulation Parameters  sitting    Electrical Stimulation Goals  Pain               PT Short Term Goals - 01/19/18 0926      PT SHORT TERM GOAL #1   Title  I with HEP    Status  Achieved        PT Long Term Goals - 02/08/18 1042      PT LONG TERM GOAL #1   Title  I with advanced HEP    Status  Partially Met      PT LONG TERM GOAL #3   Title  Patient to demo improved BLE strength to 4/5 or better to normalize gait and ADLS    Status  Partially Met      PT LONG TERM GOAL #4   Title  Patient to report decreased pain with ADLs by 50%.    Status  Partially Met            Plan - 02/08/18 1041     Clinical Impression Statement  Patient having more pain today after being on her feet all day yesterday, she also c/o fatigue in the low  back    PT Next Visit Plan  continue to work on core strength    Consulted and Agree with Plan of Care  Patient       Patient will benefit from skilled therapeutic intervention in order to improve the following deficits and impairments:  Abnormal gait, Decreased range of motion, Difficulty walking, Decreased activity tolerance, Pain, Decreased balance, Decreased strength, Postural dysfunction, Impaired flexibility  Visit Diagnosis: Chronic bilateral low back pain with bilateral sciatica  Muscle weakness (generalized)     Problem List Patient Active Problem List   Diagnosis Date Noted  . Acute respiratory failure with hypoxia ([place]) 10/24/2016  . Anxiety 09/26/2016  . Paroxysmal atrial fibrillation ([place]) 12/20/2014  . Chronic anticoagulation 12/20/2014  . Essential hypertension 07/20/2014  . Obesity 05/04/2014  . Chronic diastolic heart failure ([place]) 05/03/2014  . Ureteral calculus, right 05/16/2012  . Hydronephrosis of right kidney 05/16/2012  . Renal calculus, left 05/16/2012  . OA (osteoarthritis) of knee 04/13/2012    Sumner Boast., PT 02/08/2018, 10:46 AM  [facility] [place] [address] [place], [place], [address] Phone: [phone]   Fax:  [phone]  Name: MARQUIA COSTELLO MRN: [id number] Date of Birth: 25-Oct-1945

## 2018-02-11 DIAGNOSIS — H25812 Combined forms of age-related cataract, left eye: Secondary | ICD-10-CM | POA: Diagnosis not present

## 2018-02-11 DIAGNOSIS — H2512 Age-related nuclear cataract, left eye: Secondary | ICD-10-CM | POA: Diagnosis not present

## 2018-02-14 ENCOUNTER — Ambulatory Visit: Payer: PPO | Attending: Neurological Surgery | Admitting: Physical Therapy

## 2018-02-14 ENCOUNTER — Encounter: Payer: Self-pay | Admitting: Physical Therapy

## 2018-02-14 DIAGNOSIS — M5441 Lumbago with sciatica, right side: Secondary | ICD-10-CM | POA: Diagnosis not present

## 2018-02-14 DIAGNOSIS — G8929 Other chronic pain: Secondary | ICD-10-CM | POA: Diagnosis not present

## 2018-02-14 DIAGNOSIS — M6281 Muscle weakness (generalized): Secondary | ICD-10-CM | POA: Diagnosis not present

## 2018-02-14 DIAGNOSIS — M5442 Lumbago with sciatica, left side: Secondary | ICD-10-CM | POA: Diagnosis not present

## 2018-02-14 NOTE — Therapy (Signed)
[place], [place], [address] Phone: [phone]   Fax:  [phone]  Physical Therapy Treatment  Patient Details  Name: [name] MRN: [id number] Date of Birth: 06-27-1946 Referring Provider: Dr. Kristeen Miss   Encounter Date: 02/14/2018  PT End of Session - 02/14/18 1056    Visit Number  7    Date for PT Re-Evaluation  03/14/18    PT Start Time  1009    PT Stop Time  1112    PT Time Calculation (min)  63 min    Activity Tolerance  Patient tolerated treatment well    Behavior During Therapy  [facility] for tasks assessed/performed       Past Medical History:  Diagnosis Date  . Acute hypoxemic respiratory failure ([place]) 10/24/2016  . Arthritis    both knees  . Atrial fibrillation, chronic ([place])   . Carpal tunnel syndrome, bilateral    both hands go to sleep at times from elbows down  . Chronic diastolic heart failure ([place])   . DDD (degenerative disc disease), lumbosacral   . Dyspnea   . First degree AV block   . Hematuria 11/15/2017  . History of kidney stones   . History of rib fracture 03/2011  . Lumbar stenosis   . Obesity   . Postlaminectomy syndrome   . Pre-diabetes   . Renal calculi    BILATERAL  . Right ureteral stone   . Rotator cuff tear, left   . Rotator cuff tear, right     Past Surgical History:  Procedure Laterality Date  . ANTERIOR CERVICAL DECOMP/DISCECTOMY FUSION  03-18-2011   C3 -- C7  . APPENDECTOMY    . BILATERAL CARPAL TUNNEL RELEASE     3 screws in right hand, thumb, middle finger, and index finger,   . CARDIAC CATHETERIZATION N/A 01/16/2015   Procedure: Left Heart Cath and Coronary Angiography;  Surgeon: Belva Crome, MD;  Location: [place] [facility];  Service: Cardiovascular;  Laterality: N/A;  . CHOLECYSTECTOMY OPEN  1970'S  . COLONOSCOPY  2018  . CYSTOSCOPY WITH RETROGRADE PYELOGRAM, URETEROSCOPY AND STENT PLACEMENT N/A 02/13/2013   Procedure:  CYSTOSCOPY WITH RETROGRADE PYELOGRAM, URETEROSCOPY AND LITHOTRIPSY ;  Surgeon: Claybon Jabs, MD;  Location: [facility];  Service: Urology;  Laterality: N/A;  . CYSTOSCOPY/URETEROSCOPY/HOLMIUM LASER/STENT PLACEMENT Right 11/22/2017   Procedure: CYSTOSCOPY/URETEROSCOPY/RETROGRADE PYELOGRAM/HOLMIUM LASER/STENT PLACEMENT;  Surgeon: Kathie Rhodes, MD;  Location: [facility];  Service: Urology;  Laterality: Right;  . EXTRACORPOREAL SHOCK WAVE LITHOTRIPSY Right 05-16-2012  . FINGER ARTHROSCOPY WITH CARPOMETACARPEL (CMC) ARTHROPLASTY    . HOLMIUM LASER APPLICATION Right 11/13/3604   Procedure: HOLMIUM LASER APPLICATION;  Surgeon: Claybon Jabs, MD;  [facility];  Service: Urology;  Laterality: Right;  . KNEE ARTHROSCOPY Bilateral   . LUMBAR LAMINECTOMY  X2  1960'S  . NASAL SEPTUM SURGERY  1970'S  . SHOULDER OPEN ROTATOR CUFF REPAIR Bilateral LEFT 01-11-2001/   RIGHT 07-21-2002  . TOTAL KNEE ARTHROPLASTY  04/13/2012   Procedure: TOTAL KNEE ARTHROPLASTY;  Surgeon: Gearlean Alf, MD;  [facility];  Service: Orthopedics;  Laterality: Left;  . TOTAL KNEE ARTHROPLASTY  08/08/2012   Procedure: TOTAL KNEE ARTHROPLASTY;  Surgeon: Gearlean Alf, MD;  [facility];  Service: Orthopedics;  Laterality: Right;  . TRANSTHORACIC ECHOCARDIOGRAM  03-29-2011   MODERATE LVH/ EF 60-65%/ MILD MR  . VAGINAL HYSTERECTOMY  1960'S  There were no vitals filed for this visit.  Subjective Assessment - 02/14/18 1022    Subjective  Patient reports that she feels like she is feeling stronger and able to do more, reports that she dreads the exercises but in the long run she is feeling better, still with pain    Currently in Pain?  Yes    Pain Score  3     Pain Location  Back    Pain Orientation  Lower    Pain Descriptors / Indicators  Aching;Sore    Aggravating Factors   activity                       [facility] Adult PT Treatment/Exercise -  02/14/18 0001      Ambulation/Gait   Gait Comments  gait 240 feet, faster pace no stopping, much less wobble, but fatigued      Lumbar Exercises: Aerobic   UBE (Upper Arm Bike)  level 4 x 5 minutes    Nustep  level 5 x 6 minutes      Lumbar Exercises: Machines for Strengthening   Cybex Knee Extension  5# 2x10    Cybex Knee Flexion  20# 2x10      Lumbar Exercises: Standing   Other Standing Lumbar Exercises  2.5# hip abduction and extension and flexion 2x10 each      Lumbar Exercises: Seated   Other Seated Lumbar Exercises  yellow tband ER for shoulders, weighted ball side to side for abs    Other Seated Lumbar Exercises  seated isometric abdominal ball squeeze, green tband scapular stabilization, cues for core      Modalities   Modalities  Electrical Stimulation;Moist Heat      Moist Heat Therapy   Number Minutes Moist Heat  15 Minutes    Moist Heat Location  Lumbar Spine      Electrical Stimulation   Electrical Stimulation Location  lumbarspine    Electrical Stimulation Action  IFC    Electrical Stimulation Parameters  sitting    Electrical Stimulation Goals  Pain               PT Short Term Goals - 01/19/18 0926      PT SHORT TERM GOAL #1   Title  I with HEP    Status  Achieved        PT Long Term Goals - 02/14/18 1058      PT LONG TERM GOAL #4   Title  Patient to report decreased pain with ADLs by 50%.    Status  Partially Met      PT LONG TERM GOAL #5   Title  Patient able to stand and walk for 15 min or more without needing to sit due to pain.    Status  On-going            Plan - 02/14/18 1056    Clinical Impression Statement  Patient doing better, less wobble with walking, when she fatigues it comes back, she seems to be getting stronger and moving easier, reports that she used to take a shower and dry off and have to sit, then put on underwear and have to sit again, now able to shower, dry off and put on underwear before sitting to rest     PT Next Visit Plan  continue to work on core strength and functional gait and endurance    Consulted and Agree with Plan of Care  Patient  Patient will benefit from skilled therapeutic intervention in order to improve the following deficits and impairments:  Abnormal gait, Decreased range of motion, Difficulty walking, Decreased activity tolerance, Pain, Decreased balance, Decreased strength, Postural dysfunction, Impaired flexibility  Visit Diagnosis: Chronic bilateral low back pain with bilateral sciatica  Muscle weakness (generalized)     Problem List Patient Active Problem List   Diagnosis Date Noted  . Acute respiratory failure with hypoxia ([place]) 10/24/2016  . Anxiety 09/26/2016  . Paroxysmal atrial fibrillation ([place]) 12/20/2014  . Chronic anticoagulation 12/20/2014  . Essential hypertension 07/20/2014  . Obesity 05/04/2014  . Chronic diastolic heart failure ([place]) 05/03/2014  . Ureteral calculus, right 05/16/2012  . Hydronephrosis of right kidney 05/16/2012  . Renal calculus, left 05/16/2012  . OA (osteoarthritis) of knee 04/13/2012    Sumner Boast., PT 02/14/2018, 10:58 AM  [place] [address] [place], [place], [address] Phone: [phone]   Fax:  [phone]  Name: [name] MRN: [id number] Date of Birth: Jul 19, 1945

## 2018-02-28 ENCOUNTER — Encounter: Payer: PPO | Admitting: Physical Therapy

## 2018-03-07 ENCOUNTER — Encounter: Payer: Self-pay | Admitting: Physical Therapy

## 2018-03-07 ENCOUNTER — Ambulatory Visit: Payer: PPO | Admitting: Physical Therapy

## 2018-03-07 DIAGNOSIS — G8929 Other chronic pain: Secondary | ICD-10-CM

## 2018-03-07 DIAGNOSIS — M5442 Lumbago with sciatica, left side: Principal | ICD-10-CM

## 2018-03-07 DIAGNOSIS — M5441 Lumbago with sciatica, right side: Principal | ICD-10-CM

## 2018-03-07 DIAGNOSIS — M6281 Muscle weakness (generalized): Secondary | ICD-10-CM

## 2018-03-07 NOTE — Therapy (Signed)
[place], [place], [address] Phone: [phone]   Fax:  [phone]  Physical Therapy Treatment  Patient Details  Name: [name] MRN: [id number] Date of Birth: 05/15/1946 Referring Provider: Dr. Kristeen Miss   Encounter Date: 03/07/2018  PT End of Session - 03/07/18 1055    Visit Number  8    Date for PT Re-Evaluation  03/14/18    PT Start Time  7001    PT Stop Time  1100    PT Time Calculation (min)  45 min    Activity Tolerance  Patient tolerated treatment well    Behavior During Therapy  [facility] for tasks assessed/performed       Past Medical History:  Diagnosis Date  . Acute hypoxemic respiratory failure ([place]) 10/24/2016  . Arthritis    both knees  . Atrial fibrillation, chronic ([place])   . Carpal tunnel syndrome, bilateral    both hands go to sleep at times from elbows down  . Chronic diastolic heart failure ([place])   . DDD (degenerative disc disease), lumbosacral   . Dyspnea   . First degree AV block   . Hematuria 11/15/2017  . History of kidney stones   . History of rib fracture 03/2011  . Lumbar stenosis   . Obesity   . Postlaminectomy syndrome   . Pre-diabetes   . Renal calculi    BILATERAL  . Right ureteral stone   . Rotator cuff tear, left   . Rotator cuff tear, right     Past Surgical History:  Procedure Laterality Date  . ANTERIOR CERVICAL DECOMP/DISCECTOMY FUSION  03-18-2011   C3 -- C7  . APPENDECTOMY    . BILATERAL CARPAL TUNNEL RELEASE     3 screws in right hand, thumb, middle finger, and index finger,   . CARDIAC CATHETERIZATION N/A 01/16/2015   Procedure: Left Heart Cath and Coronary Angiography;  Surgeon: Belva Crome, MD;  Location: [facility];  Service: Cardiovascular;  Laterality: N/A;  . CHOLECYSTECTOMY OPEN  1970'S  . COLONOSCOPY  2018  . CYSTOSCOPY WITH RETROGRADE PYELOGRAM, URETEROSCOPY AND STENT PLACEMENT N/A 02/13/2013   Procedure:  CYSTOSCOPY WITH RETROGRADE PYELOGRAM, URETEROSCOPY AND LITHOTRIPSY ;  Surgeon: Claybon Jabs, MD;  Location: [facility];  Service: Urology;  Laterality: N/A;  . CYSTOSCOPY/URETEROSCOPY/HOLMIUM LASER/STENT PLACEMENT Right 11/22/2017   Procedure: CYSTOSCOPY/URETEROSCOPY/RETROGRADE PYELOGRAM/HOLMIUM LASER/STENT PLACEMENT;  Surgeon: Kathie Rhodes, MD;  Location: [facility];  Service: Urology;  Laterality: Right;  . EXTRACORPOREAL SHOCK WAVE LITHOTRIPSY Right 05-16-2012  . FINGER ARTHROSCOPY WITH CARPOMETACARPEL (CMC) ARTHROPLASTY    . HOLMIUM LASER APPLICATION Right 01/14/9448   Procedure: HOLMIUM LASER APPLICATION;  Surgeon: Claybon Jabs, MD;  Location: [facility];  Service: Urology;  Laterality: Right;  . KNEE ARTHROSCOPY Bilateral   . LUMBAR LAMINECTOMY  X2  1960'S  . NASAL SEPTUM SURGERY  1970'S  . SHOULDER OPEN ROTATOR CUFF REPAIR Bilateral LEFT 01-11-2001/   RIGHT 07-21-2002  . TOTAL KNEE ARTHROPLASTY  04/13/2012   Procedure: TOTAL KNEE ARTHROPLASTY;  Surgeon: Gearlean Alf, MD;  [facility];  Service: Orthopedics;  Laterality: Left;  . TOTAL KNEE ARTHROPLASTY  08/08/2012   Procedure: TOTAL KNEE ARTHROPLASTY;  Surgeon: Gearlean Alf, MD;  [facility];  Service: Orthopedics;  Laterality: Right;  . TRANSTHORACIC ECHOCARDIOGRAM  03-29-2011   MODERATE LVH/ EF 60-65%/ MILD MR  . VAGINAL HYSTERECTOMY  1960'S  There were no vitals filed for this visit.  Subjective Assessment - 03/07/18 1020    Subjective  Patient reports that she had to miss last week her MD appointments "ran over"    Currently in Pain?  Yes    Pain Score  8     Pain Location  Buttocks    Pain Orientation  Left    Pain Descriptors / Indicators  Aching;Burning    Aggravating Factors   bending over                       [facility] Adult PT Treatment/Exercise - 03/07/18 0001      Exercises   Exercises  Lumbar      Lumbar Exercises: Stretches    Passive Hamstring Stretch  Left;20 seconds;4 reps    Piriformis Stretch  4 reps;20 seconds      Lumbar Exercises: Aerobic   UBE (Upper Arm Bike)  level 4 x 5 minutes    Nustep  level 5 x 6 minutes      Lumbar Exercises: Seated   Other Seated Lumbar Exercises  seated isometric abdominal ball squeeze, green tband scapular stabilization, cues for core      Lumbar Exercises: Supine   Other Supine Lumbar Exercises  feet on ball K2C, trunk rotation and small bridges      Modalities   Modalities  Electrical Stimulation;Moist Heat      Moist Heat Therapy   Number Minutes Moist Heat  15 Minutes    Moist Heat Location  Lumbar Spine      Electrical Stimulation   Electrical Stimulation Location  lumbarspine    Electrical Stimulation Action  IFC    Electrical Stimulation Parameters  sitting    Electrical Stimulation Goals  Pain      Manual Therapy   Manual Therapy  Soft tissue mobilization    Soft tissue mobilization  slow MFR type STM to the left HS origin to mid               PT Short Term Goals - 01/19/18 0926      PT SHORT TERM GOAL #1   Title  I with HEP    Status  Achieved        PT Long Term Goals - 03/07/18 1057      PT LONG TERM GOAL #1   Title  I with advanced HEP    Status  Partially Met      PT LONG TERM GOAL #2   Title  Patient able to bridge in bed to improve bed mobility.    Status  Achieved            Plan - 03/07/18 1055    Clinical Impression Statement  Patient comes in today with increased left buttock pain, she reports bending over and having significant pain in the left HS origin, she is very tender here, she is very tight in the HS anyway and this tightness has probably led to her straining the left HS, SLR on the left is 40 degrees with pain in the HS origin    PT Next Visit Plan  continue to work on core strength and functional gait and endurance    Consulted and Agree with Plan of Care  Patient       Patient will benefit from  skilled therapeutic intervention in order to improve the following deficits and impairments:  Abnormal gait, Decreased range of motion, Difficulty walking, Decreased activity tolerance,  Pain, Decreased balance, Decreased strength, Postural dysfunction, Impaired flexibility  Visit Diagnosis: Chronic bilateral low back pain with bilateral sciatica  Muscle weakness (generalized)     Problem List Patient Active Problem List   Diagnosis Date Noted  . Acute respiratory failure with hypoxia ([place]) 10/24/2016  . Anxiety 09/26/2016  . Paroxysmal atrial fibrillation ([place]) 12/20/2014  . Chronic anticoagulation 12/20/2014  . Essential hypertension 07/20/2014  . Obesity 05/04/2014  . Chronic diastolic heart failure ([place]) 05/03/2014  . Ureteral calculus, right 05/16/2012  . Hydronephrosis of right kidney 05/16/2012  . Renal calculus, left 05/16/2012  . OA (osteoarthritis) of knee 04/13/2012    Sumner Boast., PT 03/07/2018, 10:57 AM  [place] [address] [place], [place], [address] Phone: [phone]   Fax:  [phone]  Name: [name] MRN: [id number] Date of Birth: 17-Jan-1946

## 2018-03-11 DIAGNOSIS — M79672 Pain in left foot: Secondary | ICD-10-CM | POA: Diagnosis not present

## 2018-03-11 DIAGNOSIS — M13872 Other specified arthritis, left ankle and foot: Secondary | ICD-10-CM | POA: Diagnosis not present

## 2018-03-11 DIAGNOSIS — M2022 Hallux rigidus, left foot: Secondary | ICD-10-CM | POA: Diagnosis not present

## 2018-03-15 DIAGNOSIS — M47816 Spondylosis without myelopathy or radiculopathy, lumbar region: Secondary | ICD-10-CM | POA: Diagnosis not present

## 2018-03-21 ENCOUNTER — Ambulatory Visit: Payer: PPO | Admitting: Physical Therapy

## 2018-03-28 ENCOUNTER — Encounter: Payer: PPO | Admitting: Physical Therapy

## 2018-04-01 DIAGNOSIS — R739 Hyperglycemia, unspecified: Secondary | ICD-10-CM | POA: Diagnosis not present

## 2018-04-01 DIAGNOSIS — I10 Essential (primary) hypertension: Secondary | ICD-10-CM | POA: Diagnosis not present

## 2018-04-08 DIAGNOSIS — E118 Type 2 diabetes mellitus with unspecified complications: Secondary | ICD-10-CM | POA: Diagnosis not present

## 2018-04-08 DIAGNOSIS — Z6841 Body Mass Index (BMI) 40.0 and over, adult: Secondary | ICD-10-CM | POA: Diagnosis not present

## 2018-04-08 DIAGNOSIS — I480 Paroxysmal atrial fibrillation: Secondary | ICD-10-CM | POA: Diagnosis not present

## 2018-04-08 DIAGNOSIS — M5417 Radiculopathy, lumbosacral region: Secondary | ICD-10-CM | POA: Diagnosis not present

## 2018-04-08 DIAGNOSIS — Z23 Encounter for immunization: Secondary | ICD-10-CM | POA: Diagnosis not present

## 2018-04-14 ENCOUNTER — Other Ambulatory Visit: Payer: Self-pay | Admitting: Neurological Surgery

## 2018-04-28 NOTE — Pre-Procedure Instructions (Signed)
Raimi Guillermo Flott  04/28/2018      [id number] [address], [place] AT [address] [place] [address] Phone: [phone] Fax: [place], [place] AT [address] [place] [address] Phone: [phone] Fax: [phone]    Your procedure is scheduled on May 06, 2018.  Report to [facility] Admitting at 530 AM.  Call this number if you have problems the morning of surgery:  [phone]   Remember:  Do not eat or drink after midnight.   Take these medicines the morning of surgery with A SIP OF WATER  Diltiazem (cartia XT)  Follow your surgeon's instructions on when to hold/resume Eliquis.  If no instructions were given call the office to determine how they would like to you take Eliquis   7 days prior to surgery STOP taking any diclofenac (voltaren) gel, Aspirin (unless otherwise instructed by your surgeon), Aleve, Naproxen, Ibuprofen, Motrin, Advil, Goody's, BC's, all herbal medications, fish oil, and all vitamins   Do not wear jewelry, make-up or nail polish.  Do not wear lotions, powders, or perfumes, or deodorant.  Do not shave 48 hours prior to surgery.    Do not bring valuables to the hospital.  [facility] is not responsible for any belongings or valuables.  Contacts, dentures or bridgework may not be worn into surgery.  Leave your suitcase in the car.  After surgery it may be brought to your room.  For patients admitted to the hospital, discharge time will be determined by your treatment team.  Patients discharged the day of surgery will not be allowed to drive home.    [place]- Preparing For Surgery  Before surgery, you can play an important role. Because skin is not sterile, your skin needs to be as free of germs as possible. You can reduce the number of germs on your skin by washing  with CHG (chlorahexidine gluconate) Soap before surgery.  CHG is an antiseptic cleaner which kills germs and bonds with the skin to continue killing germs even after washing.    Oral Hygiene is also important to reduce your risk of infection.  Remember - BRUSH YOUR TEETH THE MORNING OF SURGERY WITH YOUR REGULAR TOOTHPASTE  Please do not use if you have an allergy to CHG or antibacterial soaps. If your skin becomes reddened/irritated stop using the CHG.  Do not shave (including legs and underarms) for at least 48 hours prior to first CHG shower. It is OK to shave your face.  Please follow these instructions carefully.   1. Shower the NIGHT BEFORE SURGERY and the MORNING OF SURGERY with CHG.   2. If you chose to wash your hair, wash your hair first as usual with your normal shampoo.  3. After you shampoo, rinse your hair and body thoroughly to remove the shampoo.  4. Use CHG as you would any other liquid soap. You can apply CHG directly to the skin and wash gently with a scrungie or a clean washcloth.   5. Apply the CHG Soap to your body ONLY FROM THE NECK DOWN.  Do not use on open wounds or open sores. Avoid contact with your eyes, ears, mouth and genitals (private parts). Wash Face and genitals (private parts)  with your normal soap.  6. Wash thoroughly, paying special attention to the area where  your surgery will be performed.  7. Thoroughly rinse your body with warm water from the neck down.  8. DO NOT shower/wash with your normal soap after using and rinsing off the CHG Soap.  9. Pat yourself dry with a CLEAN TOWEL.  10. Wear CLEAN PAJAMAS to bed the night before surgery, wear comfortable clothes the morning of surgery  11. Place CLEAN SHEETS on your bed the night of your first shower and DO NOT SLEEP WITH PETS.  Day of Surgery:  Do not apply any deodorants/lotions.  Please wear clean clothes to the hospital/surgery center.   Remember to brush your teeth WITH YOUR REGULAR  TOOTHPASTE.  Please read over the following fact sheets that you were given. Pain Booklet, Coughing and Deep Breathing, MRSA Information and Surgical Site Infection Prevention

## 2018-04-29 ENCOUNTER — Encounter (HOSPITAL_COMMUNITY): Payer: Self-pay

## 2018-04-29 ENCOUNTER — Other Ambulatory Visit: Payer: Self-pay

## 2018-04-29 ENCOUNTER — Encounter (HOSPITAL_COMMUNITY)
Admission: RE | Admit: 2018-04-29 | Discharge: 2018-04-29 | Disposition: A | Discharge status: Home / Self Care | Payer: PPO | Source: Ambulatory Visit | Attending: Neurological Surgery | Admitting: Neurological Surgery

## 2018-04-29 DIAGNOSIS — Z01812 Encounter for preprocedural laboratory examination: Secondary | ICD-10-CM | POA: Diagnosis not present

## 2018-04-29 LAB — BASIC METABOLIC PANEL
Anion gap: 8 (ref 5–15)
BUN: 18 mg/dL (ref 8–23)
CALCIUM: 9.1 mg/dL (ref 8.9–10.3)
CHLORIDE: 109 mmol/L (ref 98–111)
CO2: 25 mmol/L (ref 22–32)
CREATININE: 0.98 mg/dL (ref 0.44–1.00)
GFR calc non Af Amer: 56 mL/min — ABNORMAL LOW (ref >60)
Glucose, Bld: 144 mg/dL — ABNORMAL HIGH (ref 70–99)
Potassium: 3.6 mmol/L (ref 3.5–5.1)
SODIUM: 142 mmol/L (ref 135–145)

## 2018-04-29 LAB — ABO/RH: ABO/RH(D): AB POS

## 2018-04-29 LAB — SURGICAL PCR SCREEN
MRSA, PCR: NEGATIVE
STAPHYLOCOCCUS AUREUS: NEGATIVE

## 2018-04-29 LAB — CBC
HEMATOCRIT: 41.8 % (ref 36.0–46.0)
HEMOGLOBIN: 13.1 g/dL (ref 12.0–15.0)
MCH: 28.4 pg (ref 26.0–34.0)
MCHC: 31.3 g/dL (ref 30.0–36.0)
MCV: 90.7 fL (ref 80.0–100.0)
Platelets: 352 10*3/uL (ref 150–400)
RBC: 4.61 MIL/uL (ref 3.87–5.11)
RDW: 13.2 % (ref 11.5–15.5)
WBC: 8.3 10*3/uL (ref 4.0–10.5)
nRBC: 0 % (ref 0.0–0.2)

## 2018-04-29 LAB — PROTIME-INR
INR: 1.26
Prothrombin Time: 15.6 seconds — ABNORMAL HIGH (ref 11.4–15.2)

## 2018-04-29 NOTE — Progress Notes (Signed)
PCP - Dr. Jani Gravel Cardiologist - Dr Daneen Schick  EKG - 11/22/17 Stress Test - 2016 ECHO - 2018 Cardiac Cath - 2016  Blood Thinner Instructions: Last dose of Eliquis to be taken on Monday Aspirin Instructions: N/A  Anesthesia review: Yes, Cardiac hx and ekg review  Patient denies shortness of breath, fever, cough and chest pain at PAT appointment   Patient verbalized understanding of instructions that were given to them at the PAT appointment. Patient was also instructed that they will need to review over the PAT instructions again at home before surgery.

## 2018-05-02 NOTE — Progress Notes (Addendum)
Anesthesia Chart Review:  Case:  [id number] Date/Time:  05/06/18 0715   Procedures:      Lumbar 1-2 Lumbar 2-3 Lumbar 3-4 Lumbar 4-5 Anterolateral decompression/fusion/posterior bilateral laminectomy Lumbar 4-5 Lumbar 5 Sacral 1/posterior percutaneous fixation/mazor (N/A ) - Lumbar 1-2 Lumbar 2-3 Lumbar 3-4 Lumbar 4-5 Anterolateral decompression/fusion/posterior bilateral laminectomy Lumbar 4-5 Lumbar 5 Sacral 1/posterior percutaneous fixation/mazor     LUMBAR PERCUTANEOUS PEDICLE SCREW 4 LEVEL (N/A )     APPLICATION OF ROBOTIC ASSISTANCE FOR SPINAL PROCEDURE (N/A )   Anesthesia type:  General   Pre-op diagnosis:  Degenerative lumbar spinal stenosis   Location:  [facility] OR ROOM 21 / [place] OR   Surgeon:  Kristeen Miss, MD      DISCUSSION: 72 yo female never smoker. Pertinent hx includes PAfib, chronic diastolic HF, HTN.  Pt was previously scheduled for this procedure 12/24/2017 but it was cancelled and she completed a course of PT.  She originally had cardiac clearance from Dr. Tamala Julian 11/26/2017. Per his note: "She is cleared for upcoming prolonged neurosurgical procedure.  She will be at a slightly elevated risk for cardiac complications because of her history of paroxysmal atrial fibrillation which could occur.  This could be easily managed.  No further evaluation is necessary.  No evidence of heart failure.  No history of coronary disease.  Normal cath 2016."  Anticipate she can proceed as planned barring acute status change.  VS: BP (!) 170/76   Pulse 84   Temp 36.6 C   Resp 20   Ht 5\' 2"  (1.575 m)   Wt 100.1 kg   SpO2 95%   BMI 40.35 kg/m   PROVIDERS: Jani Gravel, MD is PCP  Daneen Schick, MD is Cardiologist  LABS: Labs reviewed: Acceptable for surgery. (all labs ordered are listed, but only abnormal results are displayed)  Labs Reviewed  BASIC METABOLIC PANEL - Abnormal; Notable for the following components:      Result Value   Glucose, Bld 144 (*)    GFR calc non Af Amer 56 (*)    All other components within normal limits  PROTIME-INR - Abnormal; Notable for the following components:   Prothrombin Time 15.6 (*)    All other components within normal limits  SURGICAL PCR SCREEN  CBC  TYPE AND SCREEN  ABO/RH  A1c 6.0 on 04/01/2018   IMAGES: CHEST  2 VIEW 10/14/2016:  COMPARISON:  09/28/2016 and earlier.  FINDINGS: Upright AP and lateral views of the chest. Stable mild cardiomegaly and mediastinal contours. No pneumothorax, pulmonary edema, pleural effusion or confluent pulmonary opacity. Cervical ACDF hardware again noted. No acute osseous abnormality identified. Negative visible bowel gas pattern.  IMPRESSION: No acute cardiopulmonary abnormality.  EKG: 11/22/2017: Sinus rhythm with 1st degree A-V block. Rate 66. ST & T wave abnormality, consider anterior ischemia. No significant change.  CV: Echo 10/25/16:  - Left ventricle: The cavity size was normal. There was mildconcentric hypertrophy. Systolic function was normal. Theestimated ejection fraction was in the range of 55% to 60%. Wallmotion was normal; there were no regional wall motionabnormalities. Doppler parameters are consistent with abnormalleft ventricular relaxation (grade 1 diastolic dysfunction). - Mitral valve: Valve area by pressure half-time: 1.76 cm^2. - Left atrium: The atrium was mildly to moderately dilated.  Cardiac cath 01/16/15:   Normal coronary arteries  Normal left ventricular systolic function  False positive myocardial perfusion study  Past Medical History:  Diagnosis Date  . Acute hypoxemic respiratory failure ([place]) 10/24/2016  . Arthritis    both  knees  . Atrial fibrillation, chronic   . Carpal tunnel syndrome, bilateral    both hands go to sleep at times from elbows down  . Chronic diastolic heart failure ([place])   . DDD (degenerative disc disease), lumbosacral   . Dyspnea   . First degree AV block   . Hematuria 11/15/2017  . History of kidney stones   .  History of rib fracture 03/2011  . Lumbar stenosis   . Obesity   . Postlaminectomy syndrome   . Pre-diabetes   . Renal calculi    BILATERAL  . Right ureteral stone   . Rotator cuff tear, left   . Rotator cuff tear, right     Past Surgical History:  Procedure Laterality Date  . ANTERIOR CERVICAL DECOMP/DISCECTOMY FUSION  03-18-2011   C3 -- C7  . APPENDECTOMY    . BILATERAL CARPAL TUNNEL RELEASE     3 screws in right hand, thumb, middle finger, and index finger,   . CARDIAC CATHETERIZATION N/A 01/16/2015   Procedure: Left Heart Cath and Coronary Angiography;  Surgeon: Belva Crome, MD;  Location: [facility];  Service: Cardiovascular;  Laterality: N/A;  . CHOLECYSTECTOMY OPEN  1970'S  . COLONOSCOPY  2018  . CYSTOSCOPY WITH RETROGRADE PYELOGRAM, URETEROSCOPY AND STENT PLACEMENT N/A 02/13/2013   Procedure: CYSTOSCOPY WITH RETROGRADE PYELOGRAM, URETEROSCOPY AND LITHOTRIPSY ;  Surgeon: Claybon Jabs, MD;  Location: [facility];  Service: Urology;  Laterality: N/A;  . CYSTOSCOPY/URETEROSCOPY/HOLMIUM LASER/STENT PLACEMENT Right 11/22/2017   Procedure: CYSTOSCOPY/URETEROSCOPY/RETROGRADE PYELOGRAM/HOLMIUM LASER/STENT PLACEMENT;  Surgeon: Kathie Rhodes, MD;  Location[facility];  Service: Urology;  Laterality: Right;  . EXTRACORPOREAL SHOCK WAVE LITHOTRIPSY Right 05-16-2012  . FINGER ARTHROSCOPY WITH CARPOMETACARPEL ([facility]) ARTHROPLASTY    . HOLMIUM LASER APPLICATION Right 12/20/6787   Procedure: HOLMIUM LASER APPLICATION;  Surgeon: Claybon Jabs, MD;  Location: [facility];  Service: Urology;  Laterality: Right;  . KNEE ARTHROSCOPY Bilateral   . LUMBAR LAMINECTOMY  X2  1960'S  . NASAL SEPTUM SURGERY  1970'S  . SHOULDER OPEN ROTATOR CUFF REPAIR Bilateral LEFT 01-11-2001/   RIGHT 07-21-2002  . TOTAL KNEE ARTHROPLASTY  04/13/2012   Procedure: TOTAL KNEE ARTHROPLASTY;  Surgeon: Gearlean Alf, MD;  Location: [facility];  Service: Orthopedics;   Laterality: Left;  . TOTAL KNEE ARTHROPLASTY  08/08/2012   Procedure: TOTAL KNEE ARTHROPLASTY;  Surgeon: Gearlean Alf, MD;  Location: [facility];  Service: Orthopedics;  Laterality: Right;  . TRANSTHORACIC ECHOCARDIOGRAM  03-29-2011   MODERATE LVH/ EF 60-65%/ MILD MR  . VAGINAL HYSTERECTOMY  1960'S    MEDICATIONS: . acetaminophen (TYLENOL) 325 MG tablet  . apixaban (ELIQUIS) 5 MG TABS tablet  . diclofenac sodium (VOLTAREN) 1 % GEL  . diltiazem (CARTIA XT) 120 MG 24 hr capsule  . montelukast (SINGULAIR) 10 MG tablet   No current facility-administered medications for this encounter.     Wynonia Musty [facility] Short Stay Center/Anesthesiology Phone [phone] 05/02/2018 10:45 AM

## 2018-05-06 ENCOUNTER — Inpatient Hospital Stay (HOSPITAL_COMMUNITY)
Admission: RE | Admit: 2018-05-06 | Discharge: 2018-05-12 | DRG: 456 | Disposition: A | Discharge status: Rehabilitation Facility | Payer: PPO | Attending: Neurological Surgery | Admitting: Neurological Surgery

## 2018-05-06 ENCOUNTER — Inpatient Hospital Stay (HOSPITAL_COMMUNITY): Payer: PPO | Admitting: Certified Registered Nurse Anesthetist

## 2018-05-06 ENCOUNTER — Other Ambulatory Visit: Payer: Self-pay

## 2018-05-06 ENCOUNTER — Encounter (HOSPITAL_COMMUNITY): Payer: Self-pay | Admitting: Surgery

## 2018-05-06 ENCOUNTER — Inpatient Hospital Stay (HOSPITAL_COMMUNITY): Payer: PPO

## 2018-05-06 ENCOUNTER — Inpatient Hospital Stay (HOSPITAL_COMMUNITY)
Admission: RE | Disposition: A | Discharge status: Rehabilitation Facility | Payer: Self-pay | Source: Home / Self Care | Attending: Neurological Surgery

## 2018-05-06 ENCOUNTER — Inpatient Hospital Stay (HOSPITAL_COMMUNITY): Payer: PPO | Admitting: Physician Assistant

## 2018-05-06 DIAGNOSIS — Z833 Family history of diabetes mellitus: Secondary | ICD-10-CM

## 2018-05-06 DIAGNOSIS — Z4789 Encounter for other orthopedic aftercare: Secondary | ICD-10-CM | POA: Diagnosis not present

## 2018-05-06 DIAGNOSIS — M5416 Radiculopathy, lumbar region: Secondary | ICD-10-CM | POA: Diagnosis not present

## 2018-05-06 DIAGNOSIS — A499 Bacterial infection, unspecified: Secondary | ICD-10-CM | POA: Diagnosis not present

## 2018-05-06 DIAGNOSIS — E872 Acidosis: Secondary | ICD-10-CM | POA: Diagnosis not present

## 2018-05-06 DIAGNOSIS — M48062 Spinal stenosis, lumbar region with neurogenic claudication: Principal | ICD-10-CM | POA: Diagnosis present

## 2018-05-06 DIAGNOSIS — M48061 Spinal stenosis, lumbar region without neurogenic claudication: Secondary | ICD-10-CM | POA: Diagnosis not present

## 2018-05-06 DIAGNOSIS — G8918 Other acute postprocedural pain: Secondary | ICD-10-CM | POA: Diagnosis not present

## 2018-05-06 DIAGNOSIS — Z79899 Other long term (current) drug therapy: Secondary | ICD-10-CM

## 2018-05-06 DIAGNOSIS — Z96653 Presence of artificial knee joint, bilateral: Secondary | ICD-10-CM | POA: Diagnosis not present

## 2018-05-06 DIAGNOSIS — R269 Unspecified abnormalities of gait and mobility: Secondary | ICD-10-CM | POA: Diagnosis not present

## 2018-05-06 DIAGNOSIS — I5032 Chronic diastolic (congestive) heart failure: Secondary | ICD-10-CM

## 2018-05-06 DIAGNOSIS — G9340 Encephalopathy, unspecified: Secondary | ICD-10-CM | POA: Diagnosis not present

## 2018-05-06 DIAGNOSIS — R578 Other shock: Secondary | ICD-10-CM | POA: Diagnosis present

## 2018-05-06 DIAGNOSIS — M4156 Other secondary scoliosis, lumbar region: Secondary | ICD-10-CM | POA: Diagnosis present

## 2018-05-06 DIAGNOSIS — J9600 Acute respiratory failure, unspecified whether with hypoxia or hypercapnia: Secondary | ICD-10-CM

## 2018-05-06 DIAGNOSIS — R7303 Prediabetes: Secondary | ICD-10-CM

## 2018-05-06 DIAGNOSIS — D72829 Elevated white blood cell count, unspecified: Secondary | ICD-10-CM

## 2018-05-06 DIAGNOSIS — Z7901 Long term (current) use of anticoagulants: Secondary | ICD-10-CM

## 2018-05-06 DIAGNOSIS — D62 Acute posthemorrhagic anemia: Secondary | ICD-10-CM | POA: Diagnosis not present

## 2018-05-06 DIAGNOSIS — M4726 Other spondylosis with radiculopathy, lumbar region: Secondary | ICD-10-CM | POA: Diagnosis not present

## 2018-05-06 DIAGNOSIS — Z419 Encounter for procedure for purposes other than remedying health state, unspecified: Secondary | ICD-10-CM | POA: Diagnosis not present

## 2018-05-06 DIAGNOSIS — I4819 Other persistent atrial fibrillation: Secondary | ICD-10-CM | POA: Diagnosis not present

## 2018-05-06 DIAGNOSIS — J9601 Acute respiratory failure with hypoxia: Secondary | ICD-10-CM | POA: Diagnosis not present

## 2018-05-06 DIAGNOSIS — I110 Hypertensive heart disease with heart failure: Secondary | ICD-10-CM | POA: Diagnosis not present

## 2018-05-06 DIAGNOSIS — I517 Cardiomegaly: Secondary | ICD-10-CM | POA: Diagnosis not present

## 2018-05-06 DIAGNOSIS — M419 Scoliosis, unspecified: Secondary | ICD-10-CM | POA: Diagnosis present

## 2018-05-06 DIAGNOSIS — R739 Hyperglycemia, unspecified: Secondary | ICD-10-CM | POA: Diagnosis not present

## 2018-05-06 DIAGNOSIS — M4150 Other secondary scoliosis, site unspecified: Secondary | ICD-10-CM | POA: Diagnosis present

## 2018-05-06 DIAGNOSIS — T380X5A Adverse effect of glucocorticoids and synthetic analogues, initial encounter: Secondary | ICD-10-CM | POA: Diagnosis not present

## 2018-05-06 DIAGNOSIS — Z4682 Encounter for fitting and adjustment of non-vascular catheter: Secondary | ICD-10-CM | POA: Diagnosis not present

## 2018-05-06 DIAGNOSIS — N390 Urinary tract infection, site not specified: Secondary | ICD-10-CM | POA: Diagnosis not present

## 2018-05-06 DIAGNOSIS — M47816 Spondylosis without myelopathy or radiculopathy, lumbar region: Secondary | ICD-10-CM | POA: Diagnosis not present

## 2018-05-06 DIAGNOSIS — Z6841 Body Mass Index (BMI) 40.0 and over, adult: Secondary | ICD-10-CM | POA: Diagnosis not present

## 2018-05-06 DIAGNOSIS — K5900 Constipation, unspecified: Secondary | ICD-10-CM | POA: Diagnosis not present

## 2018-05-06 DIAGNOSIS — R52 Pain, unspecified: Secondary | ICD-10-CM | POA: Diagnosis not present

## 2018-05-06 DIAGNOSIS — J9690 Respiratory failure, unspecified, unspecified whether with hypoxia or hypercapnia: Secondary | ICD-10-CM

## 2018-05-06 DIAGNOSIS — Z90710 Acquired absence of both cervix and uterus: Secondary | ICD-10-CM

## 2018-05-06 DIAGNOSIS — M5116 Intervertebral disc disorders with radiculopathy, lumbar region: Secondary | ICD-10-CM | POA: Diagnosis not present

## 2018-05-06 DIAGNOSIS — I10 Essential (primary) hypertension: Secondary | ICD-10-CM | POA: Diagnosis not present

## 2018-05-06 DIAGNOSIS — I482 Chronic atrial fibrillation, unspecified: Secondary | ICD-10-CM | POA: Diagnosis not present

## 2018-05-06 DIAGNOSIS — M545 Low back pain: Secondary | ICD-10-CM | POA: Diagnosis present

## 2018-05-06 DIAGNOSIS — Z981 Arthrodesis status: Secondary | ICD-10-CM | POA: Diagnosis not present

## 2018-05-06 DIAGNOSIS — Z978 Presence of other specified devices: Secondary | ICD-10-CM

## 2018-05-06 DIAGNOSIS — M4104 Infantile idiopathic scoliosis, thoracic region: Secondary | ICD-10-CM | POA: Diagnosis not present

## 2018-05-06 DIAGNOSIS — I959 Hypotension, unspecified: Secondary | ICD-10-CM | POA: Diagnosis not present

## 2018-05-06 DIAGNOSIS — B9620 Unspecified Escherichia coli [E. coli] as the cause of diseases classified elsewhere: Secondary | ICD-10-CM | POA: Diagnosis not present

## 2018-05-06 DIAGNOSIS — Z9049 Acquired absence of other specified parts of digestive tract: Secondary | ICD-10-CM

## 2018-05-06 DIAGNOSIS — G8220 Paraplegia, unspecified: Secondary | ICD-10-CM | POA: Diagnosis not present

## 2018-05-06 HISTORY — PX: ANTERIOR LATERAL LUMBAR FUSION 4 LEVELS: SHX5552

## 2018-05-06 HISTORY — PX: APPLICATION OF ROBOTIC ASSISTANCE FOR SPINAL PROCEDURE: SHX6753

## 2018-05-06 HISTORY — PX: LUMBAR PERCUTANEOUS PEDICLE SCREW 4 LEVEL: SHX6318

## 2018-05-06 LAB — LACTIC ACID, PLASMA: Lactic Acid, Venous: 4.8 mmol/L (ref 0.5–1.9)

## 2018-05-06 LAB — POCT I-STAT 3, ART BLOOD GAS (G3+)
ACID-BASE DEFICIT: 4 mmol/L — AB (ref 0.0–2.0)
BICARBONATE: 21.7 mmol/L (ref 20.0–28.0)
O2 SAT: 99 %
Patient temperature: 97.5
TCO2: 23 mmol/L (ref 22–32)
pCO2 arterial: 41.8 mmHg (ref 32.0–48.0)
pH, Arterial: 7.32 — ABNORMAL LOW (ref 7.350–7.450)
pO2, Arterial: 146 mmHg — ABNORMAL HIGH (ref 83.0–108.0)

## 2018-05-06 LAB — COMPREHENSIVE METABOLIC PANEL
ALK PHOS: 79 U/L (ref 38–126)
ALT: 54 U/L — ABNORMAL HIGH (ref 0–44)
ANION GAP: 11 (ref 5–15)
AST: 71 U/L — ABNORMAL HIGH (ref 15–41)
Albumin: 3.6 g/dL (ref 3.5–5.0)
BILIRUBIN TOTAL: 1 mg/dL (ref 0.3–1.2)
BUN: 15 mg/dL (ref 8–23)
CALCIUM: 8.2 mg/dL — AB (ref 8.9–10.3)
CO2: 20 mmol/L — ABNORMAL LOW (ref 22–32)
Chloride: 106 mmol/L (ref 98–111)
Creatinine, Ser: 0.89 mg/dL (ref 0.44–1.00)
GLUCOSE: 290 mg/dL — AB (ref 70–99)
Potassium: 4 mmol/L (ref 3.5–5.1)
Sodium: 137 mmol/L (ref 135–145)
TOTAL PROTEIN: 5.6 g/dL — AB (ref 6.5–8.1)

## 2018-05-06 LAB — CBC
HEMATOCRIT: 31.2 % — AB (ref 36.0–46.0)
Hemoglobin: 9.7 g/dL — ABNORMAL LOW (ref 12.0–15.0)
MCH: 28.2 pg (ref 26.0–34.0)
MCHC: 31.1 g/dL (ref 30.0–36.0)
MCV: 90.7 fL (ref 80.0–100.0)
Platelets: 274 10*3/uL (ref 150–400)
RBC: 3.44 MIL/uL — ABNORMAL LOW (ref 3.87–5.11)
RDW: 13.2 % (ref 11.5–15.5)
WBC: 19.3 10*3/uL — ABNORMAL HIGH (ref 4.0–10.5)
nRBC: 0.2 % (ref 0.0–0.2)

## 2018-05-06 LAB — GLUCOSE, CAPILLARY: Glucose-Capillary: 274 mg/dL — ABNORMAL HIGH (ref 70–99)

## 2018-05-06 LAB — MAGNESIUM: Magnesium: 1.6 mg/dL — ABNORMAL LOW (ref 1.7–2.4)

## 2018-05-06 LAB — CK TOTAL AND CKMB (NOT AT ARMC)
CK, MB: 17.5 ng/mL — ABNORMAL HIGH (ref 0.5–5.0)
RELATIVE INDEX: 2.1 (ref 0.0–2.5)
Total CK: 827 U/L — ABNORMAL HIGH (ref 38–234)

## 2018-05-06 LAB — PHOSPHORUS: Phosphorus: 4 mg/dL (ref 2.5–4.6)

## 2018-05-06 SURGERY — ANTERIOR LATERAL LUMBAR FUSION 4 LEVELS
Anesthesia: General

## 2018-05-06 MED ORDER — FENTANYL CITRATE (PF) 250 MCG/5ML IJ SOLN
INTRAMUSCULAR | Status: AC
  Filled 2018-05-06: qty 5

## 2018-05-06 MED ORDER — SODIUM CHLORIDE 0.9 % IV SOLN
INTRAVENOUS | Status: DC | PRN
  Administered 2018-05-06: 07:00:00

## 2018-05-06 MED ORDER — 0.9 % SODIUM CHLORIDE (POUR BTL) OPTIME
TOPICAL | Status: DC | PRN
  Administered 2018-05-06 (×2): 1000 mL

## 2018-05-06 MED ORDER — MIDAZOLAM HCL 2 MG/2ML IJ SOLN
1.0000 mg | INTRAMUSCULAR | Status: DC | PRN
  Administered 2018-05-06 (×2): 1 mg via INTRAVENOUS
  Filled 2018-05-06: qty 2

## 2018-05-06 MED ORDER — EPHEDRINE SULFATE-NACL 50-0.9 MG/10ML-% IV SOSY
PREFILLED_SYRINGE | INTRAVENOUS | Status: DC | PRN
  Administered 2018-05-06 (×2): 10 mg via INTRAVENOUS

## 2018-05-06 MED ORDER — PHENYLEPHRINE HCL 10 MG/ML IJ SOLN
INTRAMUSCULAR | Status: AC
  Filled 2018-05-06: qty 1

## 2018-05-06 MED ORDER — MEPERIDINE HCL 50 MG/ML IJ SOLN: 6.2500 mg | INTRAMUSCULAR | Status: DC | PRN

## 2018-05-06 MED ORDER — FENTANYL 2500MCG IN NS 250ML (10MCG/ML) PREMIX INFUSION
0.0000 ug/h | INTRAVENOUS | Status: DC
  Administered 2018-05-06: 25 ug/h via INTRAVENOUS
  Filled 2018-05-06: qty 250

## 2018-05-06 MED ORDER — SODIUM CHLORIDE 0.9 % IV SOLN
INTRAVENOUS | Status: DC
  Administered 2018-05-06: 1000 mL via INTRAVENOUS
  Administered 2018-05-07: 13:00:00 via INTRAVENOUS

## 2018-05-06 MED ORDER — BUPIVACAINE HCL (PF) 0.5 % IJ SOLN
INTRAMUSCULAR | Status: DC | PRN
  Administered 2018-05-06: 10 mL
  Administered 2018-05-06: 15 mL
  Administered 2018-05-06: 6 mL

## 2018-05-06 MED ORDER — MIDAZOLAM HCL 5 MG/5ML IJ SOLN
INTRAMUSCULAR | Status: DC | PRN
  Administered 2018-05-06: 0.5 mg via INTRAVENOUS
  Administered 2018-05-06: 2 mg via INTRAVENOUS
  Administered 2018-05-06: 0.5 mg via INTRAVENOUS
  Administered 2018-05-06: 1 mg via INTRAVENOUS

## 2018-05-06 MED ORDER — ONDANSETRON HCL 4 MG PO TABS: 4.0000 mg | ORAL_TABLET | Freq: Four times a day (QID) | ORAL | Status: DC | PRN

## 2018-05-06 MED ORDER — LIDOCAINE 2% (20 MG/ML) 5 ML SYRINGE
INTRAMUSCULAR | Status: AC
  Filled 2018-05-06: qty 5

## 2018-05-06 MED ORDER — HYDROMORPHONE HCL 1 MG/ML IJ SOLN
INTRAMUSCULAR | Status: AC
  Filled 2018-05-06: qty 1

## 2018-05-06 MED ORDER — PROPOFOL 10 MG/ML IV BOLUS
INTRAVENOUS | Status: AC
  Filled 2018-05-06: qty 40

## 2018-05-06 MED ORDER — FENTANYL CITRATE (PF) 100 MCG/2ML IJ SOLN: 50.0000 ug | INTRAMUSCULAR | Status: DC | PRN

## 2018-05-06 MED ORDER — FAMOTIDINE IN NACL 20-0.9 MG/50ML-% IV SOLN
20.0000 mg | Freq: Two times a day (BID) | INTRAVENOUS | Status: AC
  Administered 2018-05-06 – 2018-05-08 (×4): 20 mg via INTRAVENOUS
  Filled 2018-05-06 (×4): qty 50

## 2018-05-06 MED ORDER — SENNA 8.6 MG PO TABS
1.0000 | ORAL_TABLET | Freq: Two times a day (BID) | ORAL | Status: DC
  Administered 2018-05-07 – 2018-05-12 (×11): 8.6 mg via ORAL
  Filled 2018-05-06 (×11): qty 1

## 2018-05-06 MED ORDER — SODIUM CHLORIDE 0.9% FLUSH: 3.0000 mL | INTRAVENOUS | Status: DC | PRN

## 2018-05-06 MED ORDER — CHLORHEXIDINE GLUCONATE CLOTH 2 % EX PADS: 6.0000 | MEDICATED_PAD | Freq: Once | CUTANEOUS | Status: DC

## 2018-05-06 MED ORDER — DEXAMETHASONE SODIUM PHOSPHATE 10 MG/ML IJ SOLN
INTRAMUSCULAR | Status: AC
  Filled 2018-05-06: qty 1

## 2018-05-06 MED ORDER — MORPHINE SULFATE (PF) 2 MG/ML IV SOLN
2.0000 mg | INTRAVENOUS | Status: DC | PRN
  Administered 2018-05-07 – 2018-05-10 (×9): 2 mg via INTRAVENOUS
  Administered 2018-05-11: 1 mg via INTRAVENOUS
  Filled 2018-05-06 (×11): qty 1

## 2018-05-06 MED ORDER — LIDOCAINE-EPINEPHRINE 1 %-1:100000 IJ SOLN
INTRAMUSCULAR | Status: AC
  Filled 2018-05-06: qty 1

## 2018-05-06 MED ORDER — FENTANYL CITRATE (PF) 100 MCG/2ML IJ SOLN
50.0000 ug | INTRAMUSCULAR | Status: AC | PRN
  Administered 2018-05-06 (×3): 50 ug via INTRAVENOUS
  Filled 2018-05-06: qty 2

## 2018-05-06 MED ORDER — POLYETHYLENE GLYCOL 3350 17 G PO PACK: 17.0000 g | PACK | Freq: Every day | ORAL | Status: DC | PRN

## 2018-05-06 MED ORDER — SUCCINYLCHOLINE CHLORIDE 200 MG/10ML IV SOSY
PREFILLED_SYRINGE | INTRAVENOUS | Status: DC | PRN
  Administered 2018-05-06: 120 mg via INTRAVENOUS

## 2018-05-06 MED ORDER — PROMETHAZINE HCL 25 MG/ML IJ SOLN: 6.2500 mg | INTRAMUSCULAR | Status: DC | PRN

## 2018-05-06 MED ORDER — PROPOFOL 10 MG/ML IV BOLUS
INTRAVENOUS | Status: DC | PRN
  Administered 2018-05-06: 150 mg via INTRAVENOUS
  Administered 2018-05-06: 50 mg via INTRAVENOUS

## 2018-05-06 MED ORDER — LACTATED RINGERS IV SOLN: INTRAVENOUS | Status: DC

## 2018-05-06 MED ORDER — DEXAMETHASONE SODIUM PHOSPHATE 10 MG/ML IJ SOLN
4.0000 mg | INTRAMUSCULAR | Status: DC
  Administered 2018-05-06 – 2018-05-11 (×6): 4 mg via INTRAVENOUS
  Filled 2018-05-06 (×6): qty 1

## 2018-05-06 MED ORDER — MIDAZOLAM HCL 2 MG/2ML IJ SOLN
INTRAMUSCULAR | Status: AC
  Filled 2018-05-06: qty 2

## 2018-05-06 MED ORDER — EPHEDRINE 5 MG/ML INJ
INTRAVENOUS | Status: AC
  Filled 2018-05-06: qty 10

## 2018-05-06 MED ORDER — LIDOCAINE 2% (20 MG/ML) 5 ML SYRINGE
INTRAMUSCULAR | Status: DC | PRN
  Administered 2018-05-06: 60 mg via INTRAVENOUS

## 2018-05-06 MED ORDER — SODIUM CHLORIDE 0.9 % IV SOLN
250.0000 mL | INTRAVENOUS | Status: DC
  Administered 2018-05-07: 250 mL via INTRAVENOUS

## 2018-05-06 MED ORDER — BUPIVACAINE HCL (PF) 0.5 % IJ SOLN
INTRAMUSCULAR | Status: AC
  Filled 2018-05-06: qty 30

## 2018-05-06 MED ORDER — THROMBIN 5000 UNITS EX SOLR
CUTANEOUS | Status: AC
  Filled 2018-05-06: qty 5000

## 2018-05-06 MED ORDER — MONTELUKAST SODIUM 10 MG PO TABS
10.0000 mg | ORAL_TABLET | Freq: Every day | ORAL | Status: DC
  Administered 2018-05-07 – 2018-05-11 (×5): 10 mg via ORAL
  Filled 2018-05-06 (×6): qty 1

## 2018-05-06 MED ORDER — CEFAZOLIN SODIUM-DEXTROSE 2-4 GM/100ML-% IV SOLN
2.0000 g | INTRAVENOUS | Status: AC
  Administered 2018-05-06 (×2): 2 g via INTRAVENOUS
  Filled 2018-05-06: qty 100

## 2018-05-06 MED ORDER — SODIUM CHLORIDE 0.9 % IV SOLN: 250.0000 mL | INTRAVENOUS | Status: DC

## 2018-05-06 MED ORDER — THROMBIN 5000 UNITS EX SOLR
OROMUCOSAL | Status: DC | PRN
  Administered 2018-05-06 (×3): via TOPICAL

## 2018-05-06 MED ORDER — PHENYLEPHRINE 40 MCG/ML (10ML) SYRINGE FOR IV PUSH (FOR BLOOD PRESSURE SUPPORT)
PREFILLED_SYRINGE | INTRAVENOUS | Status: DC | PRN
  Administered 2018-05-06 (×2): 160 ug via INTRAVENOUS
  Administered 2018-05-06: 80 ug via INTRAVENOUS

## 2018-05-06 MED ORDER — SODIUM CHLORIDE 0.9% FLUSH
3.0000 mL | Freq: Two times a day (BID) | INTRAVENOUS | Status: DC
  Administered 2018-05-06 – 2018-05-11 (×11): 3 mL via INTRAVENOUS

## 2018-05-06 MED ORDER — ALUM & MAG HYDROXIDE-SIMETH 200-200-20 MG/5ML PO SUSP: 30.0000 mL | Freq: Four times a day (QID) | ORAL | Status: DC | PRN

## 2018-05-06 MED ORDER — BISACODYL 10 MG RE SUPP
10.0000 mg | Freq: Every day | RECTAL | Status: DC | PRN
  Administered 2018-05-08: 10 mg via RECTAL
  Filled 2018-05-06: qty 1

## 2018-05-06 MED ORDER — LACTATED RINGERS IV SOLN
INTRAVENOUS | Status: DC | PRN
  Administered 2018-05-06 (×4): via INTRAVENOUS

## 2018-05-06 MED ORDER — MIDAZOLAM HCL 2 MG/2ML IJ SOLN
1.0000 mg | INTRAMUSCULAR | Status: DC | PRN
  Administered 2018-05-06 – 2018-05-07 (×2): 1 mg via INTRAVENOUS
  Filled 2018-05-06: qty 2

## 2018-05-06 MED ORDER — LIDOCAINE-EPINEPHRINE 1 %-1:100000 IJ SOLN
INTRAMUSCULAR | Status: DC | PRN
  Administered 2018-05-06: 15 mL
  Administered 2018-05-06: 10 mL

## 2018-05-06 MED ORDER — SUGAMMADEX SODIUM 200 MG/2ML IV SOLN
INTRAVENOUS | Status: DC | PRN
  Administered 2018-05-06: 200 mg via INTRAVENOUS

## 2018-05-06 MED ORDER — METHOCARBAMOL 1000 MG/10ML IJ SOLN
500.0000 mg | Freq: Four times a day (QID) | INTRAVENOUS | Status: DC | PRN
  Administered 2018-05-07 (×2): 500 mg via INTRAVENOUS
  Filled 2018-05-06 (×4): qty 5

## 2018-05-06 MED ORDER — PHENOL 1.4 % MT LIQD
1.0000 | OROMUCOSAL | Status: DC | PRN
  Administered 2018-05-07 (×2): 1 via OROMUCOSAL
  Filled 2018-05-06: qty 177

## 2018-05-06 MED ORDER — ONDANSETRON HCL 4 MG/2ML IJ SOLN
INTRAMUSCULAR | Status: DC | PRN
  Administered 2018-05-06: 4 mg via INTRAVENOUS

## 2018-05-06 MED ORDER — VASOPRESSIN 20 UNIT/ML IV SOLN
INTRAVENOUS | Status: AC
  Filled 2018-05-06: qty 1

## 2018-05-06 MED ORDER — CEFAZOLIN SODIUM-DEXTROSE 2-4 GM/100ML-% IV SOLN
2.0000 g | Freq: Three times a day (TID) | INTRAVENOUS | Status: AC
  Administered 2018-05-06 – 2018-05-07 (×2): 2 g via INTRAVENOUS
  Filled 2018-05-06 (×2): qty 100

## 2018-05-06 MED ORDER — DEXAMETHASONE SODIUM PHOSPHATE 10 MG/ML IJ SOLN
INTRAMUSCULAR | Status: DC | PRN
  Administered 2018-05-06: 10 mg via INTRAVENOUS

## 2018-05-06 MED ORDER — SODIUM CHLORIDE 0.9 % IV SOLN
INTRAVENOUS | Status: DC | PRN
  Administered 2018-05-06: 10:00:00 via INTRAVENOUS
  Administered 2018-05-06: 30 ug/min via INTRAVENOUS

## 2018-05-06 MED ORDER — FENTANYL CITRATE (PF) 100 MCG/2ML IJ SOLN
INTRAMUSCULAR | Status: AC
  Filled 2018-05-06: qty 2

## 2018-05-06 MED ORDER — DILTIAZEM HCL ER COATED BEADS 120 MG PO CP24
120.0000 mg | ORAL_CAPSULE | Freq: Every day | ORAL | Status: DC
  Administered 2018-05-07 – 2018-05-11 (×5): 120 mg via ORAL
  Filled 2018-05-06 (×6): qty 1

## 2018-05-06 MED ORDER — ALBUMIN HUMAN 5 % IV SOLN
INTRAVENOUS | Status: DC | PRN
  Administered 2018-05-06 (×3): via INTRAVENOUS

## 2018-05-06 MED ORDER — HYDROMORPHONE HCL 1 MG/ML IJ SOLN
0.2500 mg | INTRAMUSCULAR | Status: DC | PRN
  Administered 2018-05-06 (×4): 0.5 mg via INTRAVENOUS

## 2018-05-06 MED ORDER — VASOPRESSIN 20 UNIT/ML IV SOLN
INTRAVENOUS | Status: DC | PRN
  Administered 2018-05-06 (×2): 2 [IU] via INTRAVENOUS

## 2018-05-06 MED ORDER — FENTANYL CITRATE (PF) 100 MCG/2ML IJ SOLN
INTRAMUSCULAR | Status: DC | PRN
  Administered 2018-05-06: 50 ug via INTRAVENOUS
  Administered 2018-05-06: 25 ug via INTRAVENOUS
  Administered 2018-05-06 (×8): 50 ug via INTRAVENOUS
  Administered 2018-05-06: 25 ug via INTRAVENOUS
  Administered 2018-05-06 (×4): 50 ug via INTRAVENOUS

## 2018-05-06 MED ORDER — NOREPINEPHRINE 4 MG/250ML-% IV SOLN
0.0000 ug/min | INTRAVENOUS | Status: DC
  Administered 2018-05-06: 4 ug/min via INTRAVENOUS
  Filled 2018-05-06: qty 250

## 2018-05-06 MED ORDER — KETOROLAC TROMETHAMINE 15 MG/ML IJ SOLN
INTRAMUSCULAR | Status: AC
  Filled 2018-05-06: qty 1

## 2018-05-06 MED ORDER — KETOROLAC TROMETHAMINE 15 MG/ML IJ SOLN
7.5000 mg | Freq: Four times a day (QID) | INTRAMUSCULAR | Status: AC
  Administered 2018-05-06 – 2018-05-07 (×4): 7.5 mg via INTRAVENOUS
  Filled 2018-05-06 (×3): qty 1

## 2018-05-06 MED ORDER — METHOCARBAMOL 500 MG PO TABS
500.0000 mg | ORAL_TABLET | Freq: Four times a day (QID) | ORAL | Status: DC | PRN
  Administered 2018-05-08 – 2018-05-12 (×11): 500 mg via ORAL
  Filled 2018-05-06 (×11): qty 1

## 2018-05-06 MED ORDER — PHENYLEPHRINE 40 MCG/ML (10ML) SYRINGE FOR IV PUSH (FOR BLOOD PRESSURE SUPPORT)
PREFILLED_SYRINGE | INTRAVENOUS | Status: AC
  Filled 2018-05-06: qty 10

## 2018-05-06 MED ORDER — ONDANSETRON HCL 4 MG/2ML IJ SOLN
INTRAMUSCULAR | Status: AC
  Filled 2018-05-06: qty 2

## 2018-05-06 MED ORDER — ACETAMINOPHEN 650 MG RE SUPP: 650.0000 mg | RECTAL | Status: DC | PRN

## 2018-05-06 MED ORDER — PROPOFOL 500 MG/50ML IV EMUL
INTRAVENOUS | Status: DC | PRN
  Administered 2018-05-06: 50 ug/kg/min via INTRAVENOUS
  Administered 2018-05-06 (×2): via INTRAVENOUS

## 2018-05-06 MED ORDER — ACETAMINOPHEN 325 MG PO TABS: 325.0000 mg | ORAL_TABLET | Freq: Four times a day (QID) | ORAL | Status: DC | PRN

## 2018-05-06 MED ORDER — DOCUSATE SODIUM 100 MG PO CAPS
100.0000 mg | ORAL_CAPSULE | Freq: Two times a day (BID) | ORAL | Status: DC
  Administered 2018-05-07 – 2018-05-12 (×11): 100 mg via ORAL
  Filled 2018-05-06 (×11): qty 1

## 2018-05-06 MED ORDER — SUCCINYLCHOLINE CHLORIDE 200 MG/10ML IV SOSY
PREFILLED_SYRINGE | INTRAVENOUS | Status: AC
  Filled 2018-05-06: qty 10

## 2018-05-06 MED ORDER — MENTHOL 3 MG MT LOZG: 1.0000 | LOZENGE | OROMUCOSAL | Status: DC | PRN

## 2018-05-06 MED ORDER — ACETAMINOPHEN 325 MG PO TABS
650.0000 mg | ORAL_TABLET | ORAL | Status: DC | PRN
  Administered 2018-05-11 – 2018-05-12 (×2): 650 mg via ORAL
  Filled 2018-05-06 (×2): qty 2

## 2018-05-06 MED ORDER — ONDANSETRON HCL 4 MG/2ML IJ SOLN: 4.0000 mg | Freq: Four times a day (QID) | INTRAMUSCULAR | Status: DC | PRN

## 2018-05-06 MED ORDER — OXYCODONE-ACETAMINOPHEN 5-325 MG PO TABS: 1.0000 | ORAL_TABLET | ORAL | Status: DC | PRN

## 2018-05-06 MED ORDER — FLEET ENEMA 7-19 GM/118ML RE ENEM: 1.0000 | ENEMA | Freq: Once | RECTAL | Status: DC | PRN

## 2018-05-06 SURGICAL SUPPLY — 100 items
ADAPTER DRIVER T25 (MISCELLANEOUS) ×10 IMPLANT
BAG DECANTER FOR FLEXI CONT (MISCELLANEOUS) ×3 IMPLANT
BIT DRILL LONG 3.0X30 (BIT) ×2 IMPLANT
BIT DRILL LONG 3.0X30MM (BIT) ×1
BIT DRILL LONG 3X80 (BIT) IMPLANT
BIT DRILL LONG 3X80MM (BIT)
BIT DRILL LONG 4X80 (BIT) IMPLANT
BIT DRILL LONG 4X80MM (BIT)
BIT DRILL SHORT 3.0X30 (BIT) IMPLANT
BIT DRILL SHORT 3.0X30MM (BIT)
BIT DRILL SHORT 3X80 (BIT) IMPLANT
BIT DRILL SHORT 3X80MM (BIT)
BLADE CLIPPER SURG (BLADE) IMPLANT
BLADE SURG 11 STRL SS (BLADE) IMPLANT
BUR MATCHSTICK NEURO 3.0 LAGG (BURR) ×3 IMPLANT
CARTRIDGE OIL MAESTRO DRILL (MISCELLANEOUS) IMPLANT
CEMENT BONE KYPHX HV R (Orthopedic Implant) ×3 IMPLANT
CEMENT KYPHON C01A KIT/MIXER (Cement) ×3 IMPLANT
CLIP NEUROVISION LG (CLIP) ×3 IMPLANT
CONT SPEC 4OZ CLIKSEAL STRL BL (MISCELLANEOUS) ×3 IMPLANT
COVER BACK TABLE 24X17X13 BIG (DRAPES) IMPLANT
COVER BACK TABLE 60X90IN (DRAPES) ×3 IMPLANT
COVER WAND RF STERILE (DRAPES) ×6 IMPLANT
DERMABOND ADVANCED (GAUZE/BANDAGES/DRESSINGS) ×6
DERMABOND ADVANCED .7 DNX12 (GAUZE/BANDAGES/DRESSINGS) ×3 IMPLANT
DEVICE BONE FILLER KYPHON SZ3 (MISCELLANEOUS) ×4 IMPLANT
DIFFUSER DRILL AIR PNEUMATIC (MISCELLANEOUS) IMPLANT
DRAPE C-ARM 42X72 X-RAY (DRAPES) ×6 IMPLANT
DRAPE C-ARMOR (DRAPES) ×6 IMPLANT
DRAPE LAPAROTOMY 100X72X124 (DRAPES) ×6 IMPLANT
DRAPE POUCH INSTRU U-SHP 10X18 (DRAPES) IMPLANT
DRAPE SHEET LG 3/4 BI-LAMINATE (DRAPES) ×3 IMPLANT
DRAPE WARM FLUID 44X44 (DRAPE) ×3 IMPLANT
DRIVER ADAPTER T25 (MISCELLANEOUS) ×30
DURAPREP 26ML APPLICATOR (WOUND CARE) ×6 IMPLANT
ELECT BLADE 4.0 EZ CLEAN MEGAD (MISCELLANEOUS) ×3
ELECT REM PT RETURN 9FT ADLT (ELECTROSURGICAL) ×6
ELECTRODE BLDE 4.0 EZ CLN MEGD (MISCELLANEOUS) ×1 IMPLANT
ELECTRODE REM PT RTRN 9FT ADLT (ELECTROSURGICAL) ×2 IMPLANT
GAUZE 4X4 16PLY RFD (DISPOSABLE) ×6 IMPLANT
GLOVE BIO SURGEON STRL SZ7.5 (GLOVE) ×3 IMPLANT
GLOVE BIO SURGEON STRL SZ8.5 (GLOVE) ×3 IMPLANT
GLOVE BIOGEL PI IND STRL 7.0 (GLOVE) ×3 IMPLANT
GLOVE BIOGEL PI IND STRL 7.5 (GLOVE) ×6 IMPLANT
GLOVE BIOGEL PI IND STRL 8.5 (GLOVE) ×3 IMPLANT
GLOVE BIOGEL PI INDICATOR 7.0 (GLOVE) ×6
GLOVE BIOGEL PI INDICATOR 7.5 (GLOVE) ×12
GLOVE BIOGEL PI INDICATOR 8.5 (GLOVE) ×6
GLOVE ECLIPSE 8.5 STRL (GLOVE) ×6 IMPLANT
GLOVE EXAM NITRILE LRG STRL (GLOVE) IMPLANT
GLOVE EXAM NITRILE XL STR (GLOVE) IMPLANT
GLOVE EXAM NITRILE XS STR PU (GLOVE) IMPLANT
GLOVE INDICATOR 8.5 STRL (GLOVE) ×3 IMPLANT
GOWN STRL REUS W/ TWL LRG LVL3 (GOWN DISPOSABLE) ×1 IMPLANT
GOWN STRL REUS W/ TWL XL LVL3 (GOWN DISPOSABLE) ×3 IMPLANT
GOWN STRL REUS W/TWL 2XL LVL3 (GOWN DISPOSABLE) ×9 IMPLANT
GOWN STRL REUS W/TWL LRG LVL3 (GOWN DISPOSABLE) ×2
GOWN STRL REUS W/TWL XL LVL3 (GOWN DISPOSABLE) ×6
GUIDEWIRE 18IN BLUNT CD HORIZ (WIRE) ×36 IMPLANT
HEMOSTAT POWDER KIT SURGIFOAM (HEMOSTASIS) ×9 IMPLANT
KIT BASIN OR (CUSTOM PROCEDURE TRAY) ×6 IMPLANT
KIT DILATOR XLIF 5 (KITS) ×2 IMPLANT
KIT INFUSE SMALL (Orthopedic Implant) ×3 IMPLANT
KIT SPINE MAZOR X ROBO DISP (MISCELLANEOUS) ×3 IMPLANT
KIT SURGICAL ACCESS MAXCESS 4 (KITS) ×3 IMPLANT
KIT TURNOVER KIT B (KITS) ×3 IMPLANT
KIT XLIF (KITS) ×1
KYPHON BFD (MISCELLANEOUS) ×12
MARKER SKIN DUAL TIP RULER LAB (MISCELLANEOUS) ×6 IMPLANT
MODULE NVM5 NEXT GEN EMG (NEEDLE) ×3 IMPLANT
MODULUS XLW 10X22X45MM 10 DEG (Cage) ×3 IMPLANT
MODULUS XLW 10X22X50MM 10DEG (Spine Construct) ×3 IMPLANT
MODULUS XLW 10X22X55MM 10 (Spine Construct) ×3 IMPLANT
MODULUS XLW 12X22X55MM 10 (Spine Construct) ×3 IMPLANT
NEEDLE HYPO 25X1 1.5 SAFETY (NEEDLE) ×6 IMPLANT
NS IRRIG 1000ML POUR BTL (IV SOLUTION) ×3 IMPLANT
OIL CARTRIDGE MAESTRO DRILL (MISCELLANEOUS)
PACK LAMINECTOMY NEURO (CUSTOM PROCEDURE TRAY) ×6 IMPLANT
PAD ARMBOARD 7.5X6 YLW CONV (MISCELLANEOUS) ×12 IMPLANT
PIN HEAD 2.5X60MM (PIN) IMPLANT
PUTTY BONE ATTRAX 5CC STRIP (Putty) ×9 IMPLANT
ROD PERC 5.5X160 (Rod) ×6 IMPLANT
SCREW SCHANZ SA 4.0MM (MISCELLANEOUS) ×3 IMPLANT
SCREW SOLERA 4.5X45 (Screw) ×12 IMPLANT
SCREW SOLERA 45X5.5XMA (Screw) ×2 IMPLANT
SCREW SOLERA 45X6.5XMA (Screw) ×6 IMPLANT
SCREW SOLERA 5.5X45MM (Screw) ×4 IMPLANT
SCREW SOLERA 6.5X45MM (Screw) ×12 IMPLANT
SET SCREW SPINAL 5.5X6 TI (Screw) ×36 IMPLANT
SPONGE LAP 4X18 RFD (DISPOSABLE) IMPLANT
SPONGE SURGIFOAM ABS GEL SZ50 (HEMOSTASIS) IMPLANT
SUT VIC AB 1 CT1 18XBRD ANBCTR (SUTURE) ×3 IMPLANT
SUT VIC AB 1 CT1 8-18 (SUTURE) ×6
SUT VIC AB 2-0 CP2 18 (SUTURE) ×12 IMPLANT
SUT VIC AB 3-0 SH 8-18 (SUTURE) ×18 IMPLANT
TOWEL GREEN STERILE (TOWEL DISPOSABLE) ×3 IMPLANT
TOWEL GREEN STERILE FF (TOWEL DISPOSABLE) ×3 IMPLANT
TRAY FOLEY MTR SLVR 16FR STAT (SET/KITS/TRAYS/PACK) ×3 IMPLANT
TUBE MAZOR SA REDUCTION (TUBING) ×3 IMPLANT
WATER STERILE IRR 1000ML POUR (IV SOLUTION) ×3 IMPLANT

## 2018-05-06 NOTE — Op Note (Signed)
Date of surgery: May 06, 2018 Preoperative diagnosis: Degenerative scoliosis lumbar spine with stenosis, lumbar radiculopathy.  Back pain. Postoperative diagnosis: Same Procedure: Anterolateral decompression L1-L2 3 L3-4 L4-5 interbody arthrodesis with X-LIF device and allograft mixed with infuse.  Posterior percutaneous pedicular screw fixation from L1-S1 using robotic assistance for screw placement neuro monitoring throughout the case. Surgeon: Kristeen Miss First assistant: Kary Kos MD Anesthesia: Oral endotracheal Indications: [name] is a 72 year old individual whose had significant back and leg pain for a number of years.  She has a degenerative scoliosis that appears to be worsening and having failed efforts at conservative management I advised decompression and stabilization from L1 to the sacrum.  She is now taken to the operating room for the procedure.  Procedure: The patient was brought to the operating room supine on the stretcher.  After the smooth induction of general endotracheal anesthesia she was placed into the left lateral decubitus position and neuro monitoring was applied to the major groups of muscles in the lower extremities bilaterally in addition to the glutei bilaterally.  Then fluoroscopic visualization was used to check orthogonal allergy of the patient's positioning on the operating table in the areas of L4 53423 and 1 2 were identified on the lateral aspect.  Then starting at L4-5 back was prepped with alcohol DuraPrep and draped in sterile fashion and a vertical incision was made over the interspace.  A posterior incision was made on the right side and this was used to enter the retroperitoneal space and then guided to into the midportion of the disc space at the L4-L5 level.  A K wire was placed there to secure the tube a series of dilators were placed there is noted to be substantial irritability of the nerve roots of the lumbar plexus and several attempts to  place the tube were made ultimately we were able to place a series of dilators and then a retractor with 160 mm length arms into the space and the disc space at L4-L5 was exposed.  The area was opened with a 15 blade and then a combination of curettes and rongeurs were used to evacuate a substantial quantity of significantly degenerated disc material the space itself was noted to be quite narrowed and collapsed series of dilators were used to expand the interspace and ultimately was felt that a 12 mm tall 55 mm wide 22 mm long 10 degree lordotic spacer could be placed into this interspace.  This was filled with a combination of allograft and fused.  Attention was then turned to L3-4 were similar process was carried out again the disc space at this level was severely collapsed in ultimately a 10 x 22 x 55 mm spacer was placed.  Tension was then turned to L2-3 or similar process was carried out a 10 x 22 x 50 mm spacer with 10 degrees of lordosis was placed at L1-L2 after adequate decompression was obtained a 10 mm x 22 x 45 mm spacer with 10 degrees of lordosis was placed.  Once radiographic confirmation of all the spaces was obtained then the retractors were all removed final radiographs were obtained and the lateral incisions were closed with 2-0 Vicryl in the subcutaneous tissues and 3-0 Vicryl subarticular.  Patient was then turned to the prone position on the [place] table again with neural monitoring in place the robotic arm was attached to via a Schanz pin in the right portion of the pelvis.  A series of registration radiographs were obtained in  AP and oblique projections and the uppers vertebrae could easily be correlated with the CAT scan.  At this point K wires were passed into the pedicles of L1-L2 and L3 six-point MSIR 4.5 x 45 mm screws were placed in L1 and L2 5.5 x 45 mm screws were placed in L3.  Obtaining registration of L4-L5 and the sacrum proved to be more difficult but ultimately were able to  place K wires in L4 and L5 and S1 these were then replaced with 6.5 x 45 mm screws in L4 and L5 and 6.5 x 45 minutes millimeters screws in S1.  Neuro monitoring was performed during placement of the screws and no cut out was noted no aggravation of any of the neural elements was noted during this portion of the procedure.  With this the rod length was chosen it was felt that 160 mm precontoured rod would fit best to restore lordosis in the lumbar spine.  The rod was then passed from the screw head to screw head via the tower insertion technique and screw caps were then placed after reduction was performed from L1 to the sacrum.  Spot checks were obtained prior to placement of the screws vertebral augmentation with cement was performed using 1/2 cc of cement per screw from L1-L5.  A neutral construct was chosen when tightening the rods and final radiographs yielded good reduction with straighter spine and good lordosis.  With this all the towers were removed there was inspected carefully blood loss for this entire portion the procedure was estimated at 500 cc.  Patient had the wounds closed with #1 Vicryl in the deep tissues 2-0 Vicryl subtenons tissues and 3-0 Vicryl subcuticularly.  Return to recovery room in stable condition.

## 2018-05-06 NOTE — Consult Note (Signed)
NAME:  [name], MRN:  [id number], DOB:  1946/07/13, LOS: 0 ADMISSION DATE:  05/06/2018, CONSULTATION DATE:  05/06/18 REFERRING MD:  Ellene Route CHIEF COMPLAINT:  Post op    Brief History   [name] is a 72 y.o. female who had back pain and progressive scoliosis; therefore, admitted 10/25 for surgery.  Post op remained intubated so PCCM called for assistance.  Past Medical History  Back pain, scoliosis, A.fib, 1st degree AV block, dCHF.  Significant Hospital Events   10/25 > admit, anterolateral decompression at L1-L5 with posterior percutaneous pedicular screw fixation from L1-S1.  Consults: date of consult/date signed off & final recs:  PCCM 10/25 >   Procedures (surgical and bedside):  ETT 10/25 >   Significant Diagnostic Tests:  None.  Micro Data:  None.  Antimicrobials:  None.   Subjective:  Comfortable.  Withdraws to tactile stimuli.  Objective:  Blood pressure 133/67, pulse 82, temperature (!) 97.5 F (36.4 C), resp. rate (!) 6, height 5\' 2"  (1.575 m), weight 98.4 kg, SpO2 100 %.        Intake/Output Summary (Last 24 hours) at 05/06/2018 1810 Last data filed at 05/06/2018 1704 Gross per 24 hour  Intake 3550 ml  Output 1165 ml  Net 2385 ml   Filed Weights   05/06/18 0542  Weight: 98.4 kg    Examination: General: Adult female, in NAD. Neuro: Comfortable.  OR sedation still wearing off.  Withdraws to tactile stimulation. HEENT: [place]/AT. Sclerae anicteric.  ETT in place. Cardiovascular: RRR, no M/R/G.  Lungs: Respirations even and unlabored.  CTA bilaterally, No W/R/R.  Abdomen: BS x 4, soft, NT/ND.  Musculoskeletal: No gross deformities, no edema.  Skin: Intact, warm, no rashes.  Assessment & Plan:   Back pain with degenerative scoliosis - s/p anterolateral decompression at L1-L5 with posterior percutaneous pedicular screw fixation from L1-S1. - Post op care per neurosurgery.  Hypotension intra and post op - ? Sedation related.  Now  resolved in PACU. - Continue fluids. - Assess labs now post op.  Respiratory insufficiency - unable to extubate in PACU due to facial / neck edema after being prone x several hours for surgery. - Full vent support overnight. - SBT in AM for hopeful extubation.  Hx A.fib (on eliquis). - Defer timing of resuming anticoagulation to neurosurgery.  Hx 1st degree AV block, dCHF (echo from April 2018 with EF 55-60%, G1DD). - Assess EKG. - Hold preadmission diltiazem.   Disposition / Summary of Today's Plan 05/06/18   ICU under neurosurgery.  Continue fluids.  SBT in AM for hopeful extubation.    Diet: NPO. Pain/Anxiety/Delirium protocol (if indicated): Fentanyl PRN / Midazolam PRN. VAP protocol (if indicated): Famotidine. DVT prophylaxis: SCD's. GI prophylaxis: N/A. Hyperglycemia protocol: N/A. Mobility: Bedrest. Code Status: Full. Family Communication: None available.  Labs   CBC: No results for input(s): WBC, NEUTROABS, HGB, HCT, MCV, PLT in the last 168 hours. Basic Metabolic Panel: No results for input(s): NA, K, CL, CO2, GLUCOSE, BUN, CREATININE, CALCIUM, MG, PHOS in the last 168 hours. GFR: Estimated Creatinine Clearance: 56 mL/min (by C-G formula based on SCr of 0.98 mg/dL). No results for input(s): PROCALCITON, WBC, LATICACIDVEN in the last 168 hours. Liver Function Tests: No results for input(s): AST, ALT, ALKPHOS, BILITOT, PROT, ALBUMIN in the last 168 hours. No results for input(s): LIPASE, AMYLASE in the last 168 hours. No results for input(s): AMMONIA in the last 168 hours. ABG    Component Value Date/Time  PHART 7.360 09/26/2016 1239   PCO2ART 43.2 09/26/2016 1239   PO2ART 77.0 (L) 09/26/2016 1239   HCO3 24.4 09/26/2016 1239   TCO2 26 09/26/2016 1239   ACIDBASEDEF 1.0 09/26/2016 1239   O2SAT 95.0 09/26/2016 1239    Coagulation Profile: No results for input(s): INR, PROTIME in the last 168 hours. Cardiac Enzymes: No results for input(s): CKTOTAL, CKMB,  CKMBINDEX, TROPONINI in the last 168 hours. HbA1C: Hgb A1c MFr Bld  Date/Time Value Ref Range Status  09/29/2016 03:16 AM 6.0 (H) 4.8 - 5.6 % Final    Comment:    (NOTE)         Pre-diabetes: 5.7 - 6.4         Diabetes: >6.4         Glycemic control for adults with diabetes: <7.0   09/28/2016 03:15 AM 5.8 (H) 4.8 - 5.6 % Final    Comment:    (NOTE)         Pre-diabetes: 5.7 - 6.4         Diabetes: >6.4         Glycemic control for adults with diabetes: <7.0    CBG: No results for input(s): GLUCAP in the last 168 hours.  Admitting History of Present Illness.   Pt is encephelopathic; therefore, this HPI is obtained from chart review. [name] is a 72 y.o. female who has a PMH as outlined below including but not limited to back pain with degenerative scoliosis of L1-L5 with autofusion of L5-S1.  Due to progressive curve, surgery was suggested.  She presented to [facility] on 10/25 for anterolateral decompression at L1-L5 with posterior percutaneous pedicular screw fixation from L1-S1.  Intraop, she developed hypotension and required low dose norepinephrine.  She also continued to require ETT due to neck / facial edema after being prone for surgery x several hours.  PCCM was called for assistance with medical management.   Review of Systems:   Unable to obtain as pt is encephalopathic.  Past medical history  She,  has a past medical history of Acute hypoxemic respiratory failure ([place]) (10/24/2016), Arthritis, Atrial fibrillation, chronic, Carpal tunnel syndrome, bilateral, Chronic diastolic heart failure ([place]), DDD (degenerative disc disease), lumbosacral, Dyspnea, First degree AV block, Hematuria (11/15/2017), History of kidney stones, History of rib fracture (03/2011), Lumbar stenosis, Obesity, Postlaminectomy syndrome, Pre-diabetes, Renal calculi, Right ureteral stone, Rotator cuff tear, left, and Rotator cuff tear, right.   Surgical History    Past Surgical History:  Procedure  Laterality Date  . ANTERIOR CERVICAL DECOMP/DISCECTOMY FUSION  03-18-2011   C3 -- C7  . APPENDECTOMY    . BILATERAL CARPAL TUNNEL RELEASE     3 screws in right hand, thumb, middle finger, and index finger,   . CARDIAC CATHETERIZATION N/A 01/16/2015   Procedure: Left Heart Cath and Coronary Angiography;  Surgeon: Belva Crome, MD;  Location: [facility];  Service: Cardiovascular;  Laterality: N/A;  . CHOLECYSTECTOMY OPEN  1970'S  . COLONOSCOPY  2018  . CYSTOSCOPY WITH RETROGRADE PYELOGRAM, URETEROSCOPY AND STENT PLACEMENT N/A 02/13/2013   Procedure: CYSTOSCOPY WITH RETROGRADE PYELOGRAM, URETEROSCOPY AND LITHOTRIPSY ;  Surgeon: Claybon Jabs, MD;  Location: [facility];  Service: Urology;  Laterality: N/A;  . CYSTOSCOPY/URETEROSCOPY/HOLMIUM LASER/STENT PLACEMENT Right 11/22/2017   Procedure: CYSTOSCOPY/URETEROSCOPY/RETROGRADE PYELOGRAM/HOLMIUM LASER/STENT PLACEMENT;  Surgeon: Kathie Rhodes, MD;  Location: [facility];  Service: Urology;  Laterality: Right;  . EXTRACORPOREAL SHOCK WAVE LITHOTRIPSY Right 05-16-2012  . FINGER ARTHROSCOPY WITH  CARPOMETACARPEL (CMC) ARTHROPLASTY    . HOLMIUM LASER APPLICATION Right 0/03/7352   Procedure: HOLMIUM LASER APPLICATION;  Surgeon: Claybon Jabs, MD;  Location: [facility];  Service: Urology;  Laterality: Right;  . KNEE ARTHROSCOPY Bilateral   . LUMBAR LAMINECTOMY  X2  1960'S  . NASAL SEPTUM SURGERY  1970'S  . SHOULDER OPEN ROTATOR CUFF REPAIR Bilateral LEFT 01-11-2001/   RIGHT 07-21-2002  . TOTAL KNEE ARTHROPLASTY  04/13/2012   Procedure: TOTAL KNEE ARTHROPLASTY;  Surgeon: Gearlean Alf, MD;  Location: [facility];  Service: Orthopedics;  Laterality: Left;  . TOTAL KNEE ARTHROPLASTY  08/08/2012   Procedure: TOTAL KNEE ARTHROPLASTY;  Surgeon: Gearlean Alf, MD;  Location: [facility];  Service: Orthopedics;  Laterality: Right;  . TRANSTHORACIC ECHOCARDIOGRAM  03-29-2011   MODERATE LVH/ EF 60-65%/ MILD MR  .  VAGINAL HYSTERECTOMY  1960'S     Social History   Social History   Socioeconomic History  . Marital status: Married    Spouse name: Not on file  . Number of children: Not on file  . Years of education: Not on file  . Highest education level: Not on file  Occupational History  . Not on file  Social Needs  . Financial resource strain: Not on file  . Food insecurity:    Worry: Not on file    Inability: Not on file  . Transportation needs:    Medical: Not on file    Non-medical: Not on file  Tobacco Use  . Smoking status: Never Smoker  . Smokeless tobacco: Never Used  Substance and Sexual Activity  . Alcohol use: No  . Drug use: No  . Sexual activity: Not on file  Lifestyle  . Physical activity:    Days per week: Not on file    Minutes per session: Not on file  . Stress: Not on file  Relationships  . Social connections:    Talks on phone: Not on file    Gets together: Not on file    Attends religious service: Not on file    Active member of club or organization: Not on file    Attends meetings of clubs or organizations: Not on file    Relationship status: Not on file  . Intimate partner violence:    Fear of current or ex partner: Not on file    Emotionally abused: Not on file    Physically abused: Not on file    Forced sexual activity: Not on file  Other Topics Concern  . Not on file  Social History Narrative  . Not on file  ,  reports that she has never smoked. She has never used smokeless tobacco. She reports that she does not drink alcohol or use drugs.   Family history   Her family history includes Diabetes in her brother, brother, and sister; Diabetes type II in her mother; Heart disease in her brother and brother; Hypertension in her brother, brother, and sister; Lung cancer in her father.   Allergies Allergies  Allergen Reactions  . Tape Swelling    Adhesive Tape    Home meds  Prior to Admission medications   Medication Sig Start Date End Date  Taking? Authorizing Provider  acetaminophen (TYLENOL) 325 MG tablet Take 325-650 mg by mouth every 6 (six) hours as needed for mild pain.    Yes [provider]  apixaban (ELIQUIS) 5 MG TABS tablet Take 1 tablet (5 mg total) by mouth 2 (two) times daily. 09/14/16  Yes Belva Crome, MD  diclofenac sodium (VOLTAREN) 1 % GEL Apply 2 g topically 3 (three) times daily as needed (back pain).  10/14/16  Yes [provider]  diltiazem (CARTIA XT) 120 MG 24 hr capsule Take 1 capsule (120 mg total) by mouth daily. 11/29/17  Yes Belva Crome, MD  montelukast (SINGULAIR) 10 MG tablet Take 10 mg by mouth at bedtime.   Yes [provider]    Critical care time: 35 min.    Montey Hora, [place] [facility] Pager: [phone]  or [phone] 05/06/2018, 6:40 PM

## 2018-05-06 NOTE — Anesthesia Preprocedure Evaluation (Addendum)
Anesthesia Evaluation  Patient identified by MRN, date of birth, ID band Patient awake    Reviewed: Allergy & Precautions, NPO status , Patient's Chart, lab work & pertinent test results  Airway Mallampati: II  TM Distance: >3 FB Neck ROM: Full    Dental  (+) Teeth Intact, Dental Advisory Given   Pulmonary neg pulmonary ROS,    breath sounds clear to auscultation       Cardiovascular hypertension, + dysrhythmias Atrial Fibrillation  Rhythm:Regular Rate:Normal     Neuro/Psych Anxiety  Neuromuscular disease    GI/Hepatic negative GI ROS, Neg liver ROS,   Endo/Other  negative endocrine ROS  Renal/GU negative Renal ROS     Musculoskeletal  (+) Arthritis , Osteoarthritis,    Abdominal (+) + obese,   Peds  Hematology negative hematology ROS (+)   Anesthesia Other Findings   Reproductive/Obstetrics                            Lab Results  Component Value Date   WBC 8.3 04/29/2018   HGB 13.1 04/29/2018   HCT 41.8 04/29/2018   MCV 90.7 04/29/2018   PLT 352 04/29/2018   Lab Results  Component Value Date   CREATININE 0.98 04/29/2018   BUN 18 04/29/2018   NA 142 04/29/2018   K 3.6 04/29/2018   CL 109 04/29/2018   CO2 25 04/29/2018   Lab Results  Component Value Date   INR 1.26 04/29/2018   INR 1.2 (H) 01/10/2015   INR 1.00 07/29/2012   EKG: normal sinus rhythm, 1st degree AV block.  Anesthesia Physical Anesthesia Plan  ASA: III  Anesthesia Plan: General   Post-op Pain Management:    Induction: Intravenous  PONV Risk Score and Plan: 4 or greater and Ondansetron, Dexamethasone and Midazolam  Airway Management Planned: Oral ETT  Additional Equipment: Arterial line  Intra-op Plan:   Post-operative Plan: Extubation in OR  Informed Consent: I have reviewed the patients History and Physical, chart, labs and discussed the procedure including the risks, benefits and alternatives  for the proposed anesthesia with the patient or authorized representative who has indicated his/her understanding and acceptance.   Dental advisory given  Plan Discussed with: CRNA  Anesthesia Plan Comments:        Anesthesia Quick Evaluation

## 2018-05-06 NOTE — Progress Notes (Signed)
Physician notified of lactic acid lab results

## 2018-05-06 NOTE — H&P (Signed)
[name] is an 72 y.o. female.   Chief Complaint: Back pain bilateral lower extremity pain HPI: [name] is a 72 year old individual whose had significant back and bilateral lower extremity pain she is been followed radiographically for a number of years and has been developing a degenerative scoliosis.  She has involvement of L1-L2 3 L3-4 and L4-5.  She has autofused L5-S1.  Because the curve appears to be progressive surgery to stabilize her lumbar spine and restore some lordosis in neutral balance in the sagittal plane has been suggested.  She is now to undergo an anterolateral indirect decompression at L1-L2 3 L3-4 and L4-5 with posterior fixation.  Past Medical History:  Diagnosis Date  . Acute hypoxemic respiratory failure ([place]) 10/24/2016  . Arthritis    both knees  . Atrial fibrillation, chronic   . Carpal tunnel syndrome, bilateral    both hands go to sleep at times from elbows down  . Chronic diastolic heart failure ([place])   . DDD (degenerative disc disease), lumbosacral   . Dyspnea   . First degree AV block   . Hematuria 11/15/2017  . History of kidney stones   . History of rib fracture 03/2011  . Lumbar stenosis   . Obesity   . Postlaminectomy syndrome   . Pre-diabetes   . Renal calculi    BILATERAL  . Right ureteral stone   . Rotator cuff tear, left   . Rotator cuff tear, right     Past Surgical History:  Procedure Laterality Date  . ANTERIOR CERVICAL DECOMP/DISCECTOMY FUSION  03-18-2011   C3 -- C7  . APPENDECTOMY    . BILATERAL CARPAL TUNNEL RELEASE     3 screws in right hand, thumb, middle finger, and index finger,   . CARDIAC CATHETERIZATION N/A 01/16/2015   Procedure: Left Heart Cath and Coronary Angiography;  Surgeon: Belva Crome, MD;  Location: [facility];  Service: Cardiovascular;  Laterality: N/A;  . CHOLECYSTECTOMY OPEN  1970'S  . COLONOSCOPY  2018  . CYSTOSCOPY WITH RETROGRADE PYELOGRAM, URETEROSCOPY AND STENT PLACEMENT N/A  02/13/2013   Procedure: CYSTOSCOPY WITH RETROGRADE PYELOGRAM, URETEROSCOPY AND LITHOTRIPSY ;  Surgeon: Claybon Jabs, MD;  Location: [facility];  Service: Urology;  Laterality: N/A;  . CYSTOSCOPY/URETEROSCOPY/HOLMIUM LASER/STENT PLACEMENT Right 11/22/2017   Procedure: CYSTOSCOPY/URETEROSCOPY/RETROGRADE PYELOGRAM/HOLMIUM LASER/STENT PLACEMENT;  Surgeon: Kathie Rhodes, MD;  Location: [facility];  Service: Urology;  Laterality: Right;  . EXTRACORPOREAL SHOCK WAVE LITHOTRIPSY Right 05-16-2012  . FINGER ARTHROSCOPY WITH CARPOMETACARPEL (CMC) ARTHROPLASTY    . HOLMIUM LASER APPLICATION Right 07/18/1094   Procedure: HOLMIUM LASER APPLICATION;  Surgeon: Claybon Jabs, MD;  Location: [facility];  Service: Urology;  Laterality: Right;  . KNEE ARTHROSCOPY Bilateral   . LUMBAR LAMINECTOMY  X2  1960'S  . NASAL SEPTUM SURGERY  1970'S  . SHOULDER OPEN ROTATOR CUFF REPAIR Bilateral LEFT 01-11-2001/   RIGHT 07-21-2002  . TOTAL KNEE ARTHROPLASTY  04/13/2012   Procedure: TOTAL KNEE ARTHROPLASTY;  Surgeon: Gearlean Alf, MD;  Location: [facility];  Service: Orthopedics;  Laterality: Left;  . TOTAL KNEE ARTHROPLASTY  08/08/2012   Procedure: TOTAL KNEE ARTHROPLASTY;  Surgeon: Gearlean Alf, MD;  Location: [facility];  Service: Orthopedics;  Laterality: Right;  . TRANSTHORACIC ECHOCARDIOGRAM  03-29-2011   MODERATE LVH/ EF 60-65%/ MILD MR  . VAGINAL HYSTERECTOMY  1960'S    Family History  Problem Relation Age of Onset  . Diabetes type II Mother   .  Diabetes Sister   . Hypertension Sister   . Lung cancer Father   . Hypertension Brother   . Diabetes Brother   . Heart disease Brother   . Diabetes Brother   . Hypertension Brother   . Heart disease Brother    Social History:  reports that she has never smoked. She has never used smokeless tobacco. She reports that she does not drink alcohol or use drugs.  Allergies:  Allergies  Allergen Reactions  . Tape Swelling     Adhesive Tape    Medications Prior to Admission  Medication Sig Dispense Refill  . acetaminophen (TYLENOL) 325 MG tablet Take 325-650 mg by mouth every 6 (six) hours as needed for mild pain.     Marland Kitchen apixaban (ELIQUIS) 5 MG TABS tablet Take 1 tablet (5 mg total) by mouth 2 (two) times daily. 180 tablet 3  . diclofenac sodium (VOLTAREN) 1 % GEL Apply 2 g topically 3 (three) times daily as needed (back pain).     Marland Kitchen diltiazem (CARTIA XT) 120 MG 24 hr capsule Take 1 capsule (120 mg total) by mouth daily. 90 capsule 3  . montelukast (SINGULAIR) 10 MG tablet Take 10 mg by mouth at bedtime.      No results found for this or any previous visit (from the past 48 hour(s)). No results found.  Review of Systems  Constitutional: Negative.   HENT: Negative.   Eyes: Negative.   Respiratory: Negative.   Cardiovascular: Negative.   Gastrointestinal: Negative.   Genitourinary: Negative.   Musculoskeletal: Positive for back pain.  Skin: Negative.   Neurological: Positive for tingling, sensory change and weakness.  Endo/Heme/Allergies: Negative.     Blood pressure (!) 160/75, pulse 77, temperature 98.1 F (36.7 C), temperature source Oral, resp. rate 20, height 5\' 2"  (1.575 m), weight 98.4 kg, SpO2 99 %. Physical Exam  Constitutional: She is oriented to person, place, and time. She appears well-developed and well-nourished.  HENT:  Head: Normocephalic and atraumatic.  Eyes: Pupils are equal, round, and reactive to light. Conjunctivae and EOM are normal.  Neck: Normal range of motion. Neck supple.  Cardiovascular: Normal rate and regular rhythm.  Respiratory: Effort normal and breath sounds normal.  GI: Soft. Bowel sounds are normal.  Neurological: She is alert and oriented to person, place, and time.  Mild weakness in tibialis anterior bilaterally 4+ out of 5.  Old weakness in the quadriceps also at 4+ out of 5.  Station and gait are intact.  Absent deep tendon reflexes in the patellae and the  Achilles.  Cranial nerve examination is within limits of normal.  Rapid alternating movements station and balance are intact.  Skin: Skin is warm and dry.  Psychiatric: She has a normal mood and affect. Her behavior is normal. Judgment and thought content normal.     Assessment/Plan Degenerative scoliosis with radiculopathy and lumbar spinal stenosis L1-L5. Plan: Decompression and fusion L1-L5.  Earleen Newport, MD 05/06/2018, 7:38 AM

## 2018-05-06 NOTE — Transfer of Care (Signed)
Immediate Anesthesia Transfer of Care Note  Patient: [name]  Procedure(s) Performed: Lumbar one- two Lumbar two -three Lumbar  three-four Lumbar four-five Anterolateral decompression/fusion/posterior bilateral laminectomy Lumbar four-five Lumbar five Sacral one/posterior percutaneous fixation/mazor (N/A ) LUMBAR PERCUTANEOUS PEDICLE SCREW LUMBAR ONE TO LUMBAR FIVE WITH MAZOR ;POSTERIOR LATERAL LAMINECTOMY LUMBAR FOUR-FIVE,FIVE-SACRAL ONE (N/A ) APPLICATION OF ROBOTIC ASSISTANCE FOR SPINAL PROCEDURE (N/A )  Patient Location: PACU  Anesthesia Type:General  Level of Consciousness: drowsy  Airway & Oxygen Therapy: Patient connected to T-piece oxygen and Patient remains intubated per anesthesia plan  Post-op Assessment: Report given to RN and Post -op Vital signs reviewed and stable  Post vital signs: Reviewed and stable  Last Vitals:  Vitals Value Taken Time  BP 96/62 05/06/2018  5:18 PM  Temp    Pulse 86 05/06/2018  5:22 PM  Resp 25 05/06/2018  5:22 PM  SpO2 99 % 05/06/2018  5:22 PM  Vitals shown include unvalidated device data.  Last Pain:  Vitals:   05/06/18 0601  TempSrc:   PainSc: 8       Patients Stated Pain Goal: 4 (36/46/80 3212)  Complications: No apparent anesthesia complications

## 2018-05-06 NOTE — Anesthesia Procedure Notes (Signed)
Arterial Line Insertion Start/End10/25/2019 8:15 AM, 05/06/2018 8:25 AM Performed by: Albertha Ghee, MD, anesthesiologist  Patient location: OR. Preanesthetic checklist: patient identified, IV checked, site marked, risks and benefits discussed, surgical consent, monitors and equipment checked, pre-op evaluation, timeout performed and anesthesia consent Patient sedated Right, radial was placed Catheter size: 20 G Hand hygiene performed   Attempts: 2 Procedure performed using ultrasound guided technique. Following insertion, dressing applied and Biopatch. Post procedure assessment: normal  Patient tolerated the procedure well with no immediate complications.

## 2018-05-06 NOTE — Progress Notes (Signed)
[place] Progress Note Patient Name: [name] DOB: [birth year] MRN: [id number]   Date of Service  05/06/2018  HPI/Events of Note  Int fent not enough per RN  eICU Interventions  Fent gtt [place] to extubate in am     Intervention Category Intermediate Interventions: Other:  Leanna Sato Alva 05/06/2018, 10:12 PM

## 2018-05-06 NOTE — Anesthesia Procedure Notes (Signed)
Procedure Name: Intubation Date/Time: 05/06/2018 8:10 AM Performed by: Lowella Dell, CRNA Pre-anesthesia Checklist: Patient identified, Emergency Drugs available, Suction available and Patient being monitored Patient Re-evaluated:Patient Re-evaluated prior to induction Oxygen Delivery Method: Circle System Utilized Preoxygenation: Pre-oxygenation with 100% oxygen Induction Type: IV induction Ventilation: Mask ventilation without difficulty Laryngoscope Size: Glidescope and 3 Grade View: Grade I Tube type: Oral Tube size: 7.0 mm Number of attempts: 1 Airway Equipment and Method: Video-laryngoscopy and Rigid stylet Placement Confirmation: ETT inserted through vocal cords under direct vision,  positive ETCO2 and breath sounds checked- equal and bilateral Secured at: 21 cm Tube secured with: Tape Dental Injury: Teeth and Oropharynx as per pre-operative assessment  Comments: Elective glidescope d/t reduced neck mobility.  IV induction with Dr Marcie Bal.

## 2018-05-06 NOTE — Progress Notes (Signed)
Pt transported from PACU to 4N26 without incidence.

## 2018-05-07 ENCOUNTER — Other Ambulatory Visit: Payer: Self-pay

## 2018-05-07 ENCOUNTER — Inpatient Hospital Stay (HOSPITAL_COMMUNITY): Payer: PPO

## 2018-05-07 DIAGNOSIS — M4104 Infantile idiopathic scoliosis, thoracic region: Secondary | ICD-10-CM

## 2018-05-07 DIAGNOSIS — J9601 Acute respiratory failure with hypoxia: Secondary | ICD-10-CM

## 2018-05-07 LAB — CBC
HCT: 27.8 % — ABNORMAL LOW (ref 36.0–46.0)
Hemoglobin: 8.6 g/dL — ABNORMAL LOW (ref 12.0–15.0)
MCH: 28.1 pg (ref 26.0–34.0)
MCHC: 30.9 g/dL (ref 30.0–36.0)
MCV: 90.8 fL (ref 80.0–100.0)
Platelets: 269 10*3/uL (ref 150–400)
RBC: 3.06 MIL/uL — ABNORMAL LOW (ref 3.87–5.11)
RDW: 13.3 % (ref 11.5–15.5)
WBC: 16.1 10*3/uL — ABNORMAL HIGH (ref 4.0–10.5)
nRBC: 0 % (ref 0.0–0.2)

## 2018-05-07 LAB — BASIC METABOLIC PANEL
Anion gap: 12 (ref 5–15)
BUN: 17 mg/dL (ref 8–23)
CALCIUM: 8.5 mg/dL — AB (ref 8.9–10.3)
CO2: 22 mmol/L (ref 22–32)
CREATININE: 0.89 mg/dL (ref 0.44–1.00)
Chloride: 105 mmol/L (ref 98–111)
GFR calc non Af Amer: >60 mL/min (ref >60)
Glucose, Bld: 210 mg/dL — ABNORMAL HIGH (ref 70–99)
Potassium: 4.2 mmol/L (ref 3.5–5.1)
SODIUM: 139 mmol/L (ref 135–145)

## 2018-05-07 LAB — MAGNESIUM: Magnesium: 1.7 mg/dL (ref 1.7–2.4)

## 2018-05-07 LAB — GLUCOSE, CAPILLARY
GLUCOSE-CAPILLARY: 163 mg/dL — AB (ref 70–99)
Glucose-Capillary: 156 mg/dL — ABNORMAL HIGH (ref 70–99)
Glucose-Capillary: 171 mg/dL — ABNORMAL HIGH (ref 70–99)

## 2018-05-07 LAB — LACTIC ACID, PLASMA: LACTIC ACID, VENOUS: 3.8 mmol/L — AB (ref 0.5–1.9)

## 2018-05-07 LAB — PHOSPHORUS: PHOSPHORUS: 2.6 mg/dL (ref 2.5–4.6)

## 2018-05-07 MED ORDER — ORAL CARE MOUTH RINSE
15.0000 mL | OROMUCOSAL | Status: DC
  Administered 2018-05-07 (×2): 15 mL via OROMUCOSAL

## 2018-05-07 MED ORDER — CHLORHEXIDINE GLUCONATE 0.12% ORAL RINSE (MEDLINE KIT)
15.0000 mL | Freq: Two times a day (BID) | OROMUCOSAL | Status: DC
  Administered 2018-05-07: 15 mL via OROMUCOSAL

## 2018-05-07 MED ORDER — OXYCODONE-ACETAMINOPHEN 5-325 MG PO TABS
1.0000 | ORAL_TABLET | ORAL | Status: DC | PRN
  Administered 2018-05-07 – 2018-05-10 (×18): 2 via ORAL
  Administered 2018-05-10: 1 via ORAL
  Filled 2018-05-07 (×20): qty 2

## 2018-05-07 NOTE — Progress Notes (Signed)
NAME:  [name], MRN:  [id number], DOB:  1945-08-07, LOS: 1 ADMISSION DATE:  05/06/2018, CONSULTATION DATE:  05/06/18 REFERRING MD:  Ellene Route CHIEF COMPLAINT:  Post op    Brief History   [name] is a 72 y.o. female who had back pain and progressive scoliosis; therefore, admitted 10/25 for surgery.  Post op remained intubated so PCCM called for assistance.  Past Medical History  Back pain, scoliosis, A.fib, 1st degree AV block, dCHF.  Significant Hospital Events   10/25  Admit, anterolateral decompression at L1-L5 with posterior percutaneous pedicular screw fixation from L1-S1.  Consults: date of consult/date signed off & final recs:  PCCM 10/25 >   Procedures (surgical and bedside):  ETT 10/25 >> 10/26  Significant Diagnostic Tests:    Micro Data:    Antimicrobials:     Subjective:  Extubated early am 10/26. No acute events.  Pt reports LLE weakness (Dr. Ellene Route aware).  States she feels better overall. Reports she has pain in her buttock that moves down the back of her leg.   Objective:  Blood pressure (!) 146/92, pulse 89, temperature 98.2 F (36.8 C), temperature source Oral, resp. rate 17, height 5\' 5"  (1.651 m), weight 117.6 kg, SpO2 94 %.    Vent Mode: PSV;CPAP FiO2 (%):  [30 %-40 %] 30 % Set Rate:  [12 bmp] 12 bmp Vt Set:  [450 mL] 450 mL PEEP:  [5 cmH20] 5 cmH20 Pressure Support:  [5 cmH20] 5 cmH20 Plateau Pressure:  [13 cmH20-14 cmH20] 13 cmH20   Intake/Output Summary (Last 24 hours) at 05/07/2018 1216 Last data filed at 05/07/2018 1100 Gross per 24 hour  Intake 3182.19 ml  Output 1005 ml  Net 2177.19 ml   Filed Weights   05/06/18 0542 05/06/18 2030  Weight: 98.4 kg 117.6 kg    Examination: General: elderly female in NAD lying in bed HEENT: MM pink/moist, no jvd Neuro: AAOx4, speech clear, MAE, pt able to lift LLE off bed but weak compared to R, push/pull equal bilaterally CV: s1s2 rrr, no m/r/g PULM: even/non-labored, lungs  bilaterally clear  GE:XBMW, non-tender, bsx4 active  Extremities: warm/dry, no edema  Skin: no rashes or lesions   Assessment & Plan:   Back pain with degenerative scoliosis - s/p anterolateral decompression at L1-L5 with posterior percutaneous pedicular screw fixation from L1-S1. P: Post operative care per NSGY  Decadron 4mg  Q24 PT efforts / mobilize as able   Hypotension intra and post op - ? Sedation related.  Now resolved. P: PO fluids as tolerated  Monitor BP trend  Acute Respiratory Insufficiency - unable to extubate in PACU due to facial / neck edema after being prone x 8 hours for surgery.  Resolved, on room air. P:  Pulmonary hygiene post extubation   Hx A.fib (on eliquis). P: Defer timing of anticoagulation restart to NSGY  Continue cardizem  Hx 1st degree AV block, dCHF (echo from April 2018 with EF 55-60%, G1DD). P: Tele monitoring      Disposition / Summary of Today's Plan 05/07/18   Monitor in ICU under Dr. Clarice Pole service.  Will as TRH to follow as of 10/27 for medical management. PCCM will sign off.     Diet: Heart healthy as tolerated  DVT prophylaxis: SCD's. GI prophylaxis: N/A. Hyperglycemia protocol: N/A. Mobility: Bedrest. Code Status: Full. Family Communication: Patient and family updated on plan of care 10/27.  Labs   CBC: Recent Labs  Lab 05/06/18 2247 05/07/18 1004  WBC 19.3* 16.1*  HGB 9.7* 8.6*  HCT 31.2* 27.8*  MCV 90.7 90.8  PLT 274 384   Basic Metabolic Panel: Recent Labs  Lab 05/06/18 2247 05/07/18 1004  NA 137 139  K 4.0 4.2  CL 106 105  CO2 20* 22  GLUCOSE 290* 210*  BUN 15 17  CREATININE 0.89 0.89  CALCIUM 8.2* 8.5*  MG 1.6* 1.7  PHOS 4.0 2.6   GFR: Estimated Creatinine Clearance: 72.2 mL/min (by C-G formula based on SCr of 0.89 mg/dL). Recent Labs  Lab 05/06/18 2247 05/07/18 1004  WBC 19.3* 16.1*  LATICACIDVEN 4.8* 3.8*   Liver Function Tests: Recent Labs  Lab 05/06/18 2247  AST 71*  ALT 54*    ALKPHOS 79  BILITOT 1.0  PROT 5.6*  ALBUMIN 3.6   No results for input(s): LIPASE, AMYLASE in the last 168 hours. No results for input(s): AMMONIA in the last 168 hours. ABG    Component Value Date/Time   PHART 7.320 (L) 05/06/2018 2311   PCO2ART 41.8 05/06/2018 2311   PO2ART 146.0 (H) 05/06/2018 2311   HCO3 21.7 05/06/2018 2311   TCO2 23 05/06/2018 2311   ACIDBASEDEF 4.0 (H) 05/06/2018 2311   O2SAT 99.0 05/06/2018 2311    Coagulation Profile: No results for input(s): INR, PROTIME in the last 168 hours.   Cardiac Enzymes: Recent Labs  Lab 05/06/18 2247  CKTOTAL 827*  CKMB 17.5*   HbA1C: Hgb A1c MFr Bld  Date/Time Value Ref Range Status  09/29/2016 03:16 AM 6.0 (H) 4.8 - 5.6 % Final    Comment:    (NOTE)         Pre-diabetes: 5.7 - 6.4         Diabetes: >6.4         Glycemic control for adults with diabetes: <7.0   09/28/2016 03:15 AM 5.8 (H) 4.8 - 5.6 % Final    Comment:    (NOTE)         Pre-diabetes: 5.7 - 6.4         Diabetes: >6.4         Glycemic control for adults with diabetes: <7.0    CBG: Recent Labs  Lab 05/06/18 2337  GLUCAP 30*        Emily Gens, NP-C [facility] Pgr: [phone] or if no answer [phone] 05/07/2018, 12:16 PM

## 2018-05-07 NOTE — Anesthesia Postprocedure Evaluation (Signed)
Anesthesia Post Note  Patient: [name]  Procedure(s) Performed: Lumbar one- two Lumbar two -three Lumbar  three-four Lumbar four-five Anterolateral decompression/fusion/posterior bilateral laminectomy Lumbar four-five Lumbar five Sacral one/posterior percutaneous fixation/mazor (N/A ) LUMBAR PERCUTANEOUS PEDICLE SCREW LUMBAR ONE TO LUMBAR FIVE WITH MAZOR ;POSTERIOR LATERAL LAMINECTOMY LUMBAR FOUR-FIVE,FIVE-SACRAL ONE (N/A ) APPLICATION OF ROBOTIC ASSISTANCE FOR SPINAL PROCEDURE (N/A )     Patient location during evaluation: PACU Anesthesia Type: General Level of consciousness: sedated Pain management: pain level controlled Vital Signs Assessment: post-procedure vital signs reviewed and stable Respiratory status: patient remains intubated per anesthesia plan Cardiovascular status: stable Postop Assessment: no apparent nausea or vomiting Anesthetic complications: no Comments: Negative leak test, will transfer to ICU for vent wean & extubation .     Last Vitals:  Vitals:   05/07/18 0700 05/07/18 0800  BP: (!) 143/74 (!) 151/73  Pulse: 77 79  Resp: 12 10  Temp:    SpO2: 100% 100%    Last Pain:  Vitals:   05/07/18 0720  TempSrc:   PainSc: [place]

## 2018-05-07 NOTE — Procedures (Signed)
Extubation Procedure Note  Patient Details:   Name: [name] DOB: 1946/06/29 MRN: [id number]   Airway Documentation:    Vent end date: 05/07/18 Vent end time: 0630   Evaluation  O2 sats: stable throughout Complications: No apparent complications Patient did tolerate procedure well. Bilateral Breath Sounds: Diminished   Yes   Patient tolerated wean. Positive cuff leak. MD ordered to extubate. Patient extubated to a 2 Lpm nasal cannula. No dyspnea or stridor noted. Patient resting comfortably. RN at bedside.  Myrtie Neither 05/07/2018, 6:43 AM

## 2018-05-07 NOTE — Progress Notes (Signed)
Subjective: Patient reports patient doing well without complaints this morningextubated earlier this morning feels a little bit weak in her left quad  Objective: Vital signs in last 24 hours: Temp:  [97.1 F (36.2 C)-97.7 F (36.5 C)] 97.7 F (36.5 C) (10/26 0400) Pulse Rate:  [69-120] 77 (10/26 0700) Resp:  [6-25] 12 (10/26 0700) BP: (96-164)/(46-90) 143/74 (10/26 0700) SpO2:  [98 %-100 %] 100 % (10/26 0700) Arterial Line BP: (82-108)/(75-99) 108/99 (10/25 2245) FiO2 (%):  [30 %-40 %] 30 % (10/26 0423) Weight:  [117.6 kg] 117.6 kg (10/25 2030)  Intake/Output from previous day: 10/25 0701 - 10/26 0700 In: 4601 [I.V.:3600.9; IV Piggyback:1000.1] Out: 1915 [Urine:1315; Blood:600] Intake/Output this shift: No intake/output data recorded.  oh plus out of 5 weakness left quadricep sensation normal distal looks to me strength 5 out of 5 she has no radicular pain. All pain is incisional  Lab Results: Recent Labs    05/06/18 2247  WBC 19.3*  HGB 9.7*  HCT 31.2*  PLT 274   BMET Recent Labs    05/06/18 2247  NA 137  K 4.0  CL 106  CO2 20*  GLUCOSE 290*  BUN 15  CREATININE 0.89  CALCIUM 8.2*    Studies/Results: Dg Lumbar Spine 2-3 Views  Result Date: 05/06/2018 CLINICAL DATA:  Low back pain. EXAM: LUMBAR SPINE - 2-3 VIEW; DG C-ARM 61-120 MIN COMPARISON:  MRI lumbar spine 10/01/2017. FINDINGS: Intraoperative C-arm films demonstrate L1 through L5 indirect anterolateral decompression, with posterior fixation. Pedicle screws are seen BILATERAL at L1 through L5, with additional screws placed into the first sacral segment, all attached to connecting rod. There is improvement in degenerative scoliosis. The RIGHT S1 screw approaches the foramen. IMPRESSION: Intraoperative radiographs as described. Electronically Signed   By: Staci Righter M.D.   On: 05/06/2018 16:31   Dg Chest Port 1 View  Result Date: 05/06/2018 CLINICAL DATA:  Endotracheal tube placement EXAM: PORTABLE CHEST  1 VIEW COMPARISON:  None. FINDINGS: Endotracheal tube tip at the clavicular heads. Cardiopericardial enlargement. Hazy and somewhat streaky density at the left base. Lung volumes are low. No edema. No pneumothorax. IMPRESSION: 1. Endotracheal tube in good position. 2. Cardiomegaly. 3. Low volumes with probable mild atelectasis at the left base. Electronically Signed   By: Monte Fantasia M.D.   On: 05/06/2018 19:46   Dg C-arm 1-60 Min  Result Date: 05/06/2018 CLINICAL DATA:  Low back pain. EXAM: LUMBAR SPINE - 2-3 VIEW; DG C-ARM 61-120 MIN COMPARISON:  MRI lumbar spine 10/01/2017. FINDINGS: Intraoperative C-arm films demonstrate L1 through L5 indirect anterolateral decompression, with posterior fixation. Pedicle screws are seen BILATERAL at L1 through L5, with additional screws placed into the first sacral segment, all attached to connecting rod. There is improvement in degenerative scoliosis. The RIGHT S1 screw approaches the foramen. IMPRESSION: Intraoperative radiographs as described. Electronically Signed   By: Staci Righter M.D.   On: 05/06/2018 16:31   Dg C-arm 1-60 Min  Result Date: 05/06/2018 CLINICAL DATA:  Low back pain. EXAM: LUMBAR SPINE - 2-3 VIEW; DG C-ARM 61-120 MIN COMPARISON:  MRI lumbar spine 10/01/2017. FINDINGS: Intraoperative C-arm films demonstrate L1 through L5 indirect anterolateral decompression, with posterior fixation. Pedicle screws are seen BILATERAL at L1 through L5, with additional screws placed into the first sacral segment, all attached to connecting rod. There is improvement in degenerative scoliosis. The RIGHT S1 screw approaches the foramen. IMPRESSION: Intraoperative radiographs as described. Electronically Signed   By: Staci Righter M.D.   On: 05/06/2018  16:31   Dg C-arm 1-60 Min  Result Date: 05/06/2018 CLINICAL DATA:  Low back pain. EXAM: LUMBAR SPINE - 2-3 VIEW; DG C-ARM 61-120 MIN COMPARISON:  MRI lumbar spine 10/01/2017. FINDINGS: Intraoperative C-arm films  demonstrate L1 through L5 indirect anterolateral decompression, with posterior fixation. Pedicle screws are seen BILATERAL at L1 through L5, with additional screws placed into the first sacral segment, all attached to connecting rod. There is improvement in degenerative scoliosis. The RIGHT S1 screw approaches the foramen. IMPRESSION: Intraoperative radiographs as described. Electronically Signed   By: Staci Righter M.D.   On: 05/06/2018 16:31    Assessment/Plan: Postoperative day 1 multilevel anterolateral interbody fusions with percutaneous screws. Patient overall is doing very well with only incisional pain and no radicular pain she has a little bit of quad weakness but there is no pain and there is no numbness she does have antigravity at 4-4+ out of 5. This is not the same side as Dr. Ellene Route approached anterolaterally as this is on the left knee came in from the right. We'll continue to observe I think this will get better thickened could represent weakness on positioning.  LOS: 1 day     Ailea Rhatigan P 05/07/2018, 8:17 AM

## 2018-05-07 NOTE — Progress Notes (Signed)
175 ml of fentanyl 2500 mcg in 250 NS wasted down sink and witnessed by [employer] RN

## 2018-05-07 NOTE — Social Work (Signed)
CSW acknowledging consult for SNF placement. Will follow for therapy recommendations.   Scottlynn Lindell H Peretz Thieme, LCSWA [facility] Clinical Social Work [phone]   

## 2018-05-07 NOTE — Progress Notes (Signed)
[facility] Progress Note Patient Name: [name] DOB: 15-Jun-1946 MRN: [id number]   Date of Service  05/07/2018  HPI/Events of Note  Tolerates PS 5/5 with good TV  eICU Interventions  Extubate     Intervention Category Major Interventions: Respiratory failure - evaluation and management  Rakesh V. Alva 05/07/2018, 6:28 AM

## 2018-05-07 NOTE — Progress Notes (Signed)
Rehab Admissions Coordinator Note:  Per PT recommendation, Patient was screened by Jhonnie Garner for appropriateness for an Inpatient Acute Rehab Consult.  At this time, we are recommending Inpatient Rehab consult. AC will contact MD regarding request for IP rehab Consult Order.   Jhonnie Garner 05/07/2018, 8:59 PM  I can be reached at [phone].

## 2018-05-07 NOTE — Evaluation (Signed)
Physical Therapy Evaluation Patient Details Name: [name] MRN: [id number] DOB: 1946/03/06 Today's Date: 05/07/2018   History of Present Illness  pt is a 72 y/o female with h/o DDD,  bil rot cuff sx, afib, admitted with significant back and bil LE pain for degenerative scoliosis.  Pt s/p Anterolateral decompression L1-5 with fusion using XLIF and bone grafting.  Further fixated posteriorly with pedicular screws from L1-S1.  Clinical Impression  Pt admitted with/for lumbar fusion using Xlif device.  Presently pt needing mod to max assist for basic bed mobility, transfers and gait.  Education has be initiated extensively with the family.  Pt currently limited functionally due to the problems listed. ( See problems list.)   Pt will benefit from PT to maximize function and safety in order to get ready for next venue listed below.     Follow Up Recommendations CIR    Equipment Recommendations  3in1 (PT)    Recommendations for Other Services Rehab consult     Precautions / Restrictions Precautions Precautions: Back Required Braces or Orthoses: Spinal Brace Spinal Brace: Lumbar corset;Applied in sitting position      Mobility  Bed Mobility Overal bed mobility: Needs Assistance Bed Mobility: Rolling;Sidelying to Sit;Sit to Sidelying Rolling: Mod assist Sidelying to sit: Max assist     Sit to sidelying: Max assist;+2 for physical assistance General bed mobility comments: cues for technique, significant assist and support while pt attempt best assist with legs and L UE  Transfers Overall transfer level: Needs assistance   Transfers: Sit to/from Stand Sit to Stand: Mod assist;From elevated surface         General transfer comment: cues for hand placement and both boost and forward assist  Ambulation/Gait Ambulation/Gait assistance: Mod assist;+2 safety/equipment Gait Distance (Feet): 12 Feet(x2 to/from bathroom) Assistive device: Rolling walker (2 wheeled) Gait  Pattern/deviations: Step-to pattern Gait velocity: slow Gait velocity interpretation: <1.31 ft/sec, indicative of household ambulator General Gait Details: short, tentative, step to pattern with weak L quad with occasional mild buckling.  consistent VC for sequencing  Stairs            Wheelchair Mobility    Modified Rankin (Stroke Patients Only)       Balance Overall balance assessment: Needs assistance   Sitting balance-Leahy Scale: Fair       Standing balance-Leahy Scale: Poor Standing balance comment: reliance on the RW and external support                             Pertinent Vitals/Pain Pain Assessment: 0-10 Pain Score: 8  Pain Location: L hip down back of leg to knee Pain Descriptors / Indicators: Burning;Grimacing;Guarding;Sharp Pain Intervention(s): Monitored during session;Premedicated before session;Limited activity within patient's tolerance    Home Living Family/patient expects to be discharged to:: Private residence Living Arrangements: Spouse/significant other Available Help at Discharge: Family;Other (Comment)(including her sister) Type of Home: House Home Access: Stairs to enter Entrance Stairs-Rails: Right;Left   Home Layout: One level Home Equipment: Environmental consultant - 2 wheels;Walker - 4 wheels;Cane - single point;Shower seat;Grab bars - tub/shower      Prior Function Level of Independence: Independent         Comments: very limited mobility lately     Hand Dominance        Extremity/Trunk Assessment        Lower Extremity Assessment Lower Extremity Assessment: Generalized weakness;LLE deficits/detail LLE Deficits / Details: gross strength >3/5, hams 3-,  hip flexors 2+,  painful lifting against gravity more than w/bearing LLE Coordination: decreased fine motor       Communication   Communication: No difficulties  Cognition Arousal/Alertness: Awake/alert Behavior During Therapy: WFL for tasks  assessed/performed Overall Cognitive Status: Within Functional Limits for tasks assessed                                        General Comments General comments (skin integrity, edema, etc.): extensive education on back care, prec, lifting restriction, bracing issues and progression of activity--with pt, sister and husband.    Exercises     Assessment/Plan    PT Assessment Patient needs continued PT services  PT Problem List Decreased strength;Decreased activity tolerance;Decreased balance;Decreased mobility;Decreased knowledge of use of DME;Decreased knowledge of precautions;Pain       PT Treatment Interventions Gait training;Functional mobility training;Therapeutic activities;DME instruction;Balance training;Patient/family education    PT Goals (Current goals can be found in the Care Plan section)  Acute Rehab PT Goals Patient Stated Goal: Able to do for myself and walk further PT Goal Formulation: With patient/family Time For Goal Achievement: 05/21/18 Potential to Achieve Goals: Good    Frequency Min 5X/week   Barriers to discharge        Co-evaluation               AM-PAC PT "6 Clicks" Daily Activity  Outcome Measure Difficulty turning over in bed (including adjusting bedclothes, sheets and blankets)?: Unable Difficulty moving from lying on back to sitting on the side of the bed? : Unable Difficulty sitting down on and standing up from a chair with arms (e.g., wheelchair, bedside commode, etc,.)?: Unable Help needed moving to and from a bed to chair (including a wheelchair)?: A Lot Help needed walking in hospital room?: A Lot Help needed climbing 3-5 steps with a railing? : A Lot 6 Click Score: 9    End of Session Equipment Utilized During Treatment: Back brace Activity Tolerance: Patient tolerated treatment well;Patient limited by pain Patient left: in bed;with call bell/phone within reach;with family/visitor present Nurse Communication:  Mobility status PT Visit Diagnosis: Unsteadiness on feet (R26.81);Other abnormalities of gait and mobility (R26.89);Muscle weakness (generalized) (M62.81);Pain Pain - Right/Left: Left Pain - part of body: (incision, left leg)    Time: 2376-2831 PT Time Calculation (min) (ACUTE ONLY): 52 min   Charges:   PT Evaluation $PT Eval Moderate Complexity: 1 Mod PT Treatments $Gait Training: 8-22 mins $Self Care/Home Management: 8-22        05/07/2018  Donnella Sham, [place] [phone]  (pager) [phone]  (office)  [name] 05/07/2018, 6:46 PM

## 2018-05-08 DIAGNOSIS — Z419 Encounter for procedure for purposes other than remedying health state, unspecified: Secondary | ICD-10-CM

## 2018-05-08 LAB — GLUCOSE, CAPILLARY
GLUCOSE-CAPILLARY: 182 mg/dL — AB (ref 70–99)
GLUCOSE-CAPILLARY: 169 mg/dL — AB (ref 70–99)
Glucose-Capillary: 164 mg/dL — ABNORMAL HIGH (ref 70–99)
Glucose-Capillary: 167 mg/dL — ABNORMAL HIGH (ref 70–99)

## 2018-05-08 LAB — CBC
HEMATOCRIT: 25.7 % — AB (ref 36.0–46.0)
HEMOGLOBIN: 7.9 g/dL — AB (ref 12.0–15.0)
MCH: 28.3 pg (ref 26.0–34.0)
MCHC: 30.7 g/dL (ref 30.0–36.0)
MCV: 92.1 fL (ref 80.0–100.0)
Platelets: 252 10*3/uL (ref 150–400)
RBC: 2.79 MIL/uL — ABNORMAL LOW (ref 3.87–5.11)
RDW: 13.8 % (ref 11.5–15.5)
WBC: 14.7 10*3/uL — ABNORMAL HIGH (ref 4.0–10.5)
nRBC: 0 % (ref 0.0–0.2)

## 2018-05-08 LAB — RETICULOCYTES
IMMATURE RETIC FRACT: 5.5 % (ref 2.3–15.9)
RBC.: 2.79 MIL/uL — AB (ref 3.87–5.11)
RETIC COUNT ABSOLUTE: 44.4 10*3/uL (ref 19.0–186.0)
RETIC CT PCT: 1.6 % (ref 0.4–3.1)

## 2018-05-08 LAB — FOLATE: Folate: 5.9 ng/mL — ABNORMAL LOW (ref >5.9)

## 2018-05-08 LAB — IRON AND TIBC
Iron: 47 ug/dL (ref 28–170)
SATURATION RATIOS: 18 % (ref 10.4–31.8)
TIBC: 256 ug/dL (ref 250–450)
UIBC: 209 ug/dL

## 2018-05-08 LAB — VITAMIN B12: Vitamin B-12: 271 pg/mL (ref 180–914)

## 2018-05-08 LAB — FERRITIN: Ferritin: 240 ng/mL (ref 11–307)

## 2018-05-08 LAB — LACTIC ACID, PLASMA: Lactic Acid, Venous: 1.8 mmol/L (ref 0.5–1.9)

## 2018-05-08 MED ORDER — VITAMIN B-12 100 MCG PO TABS
100.0000 ug | ORAL_TABLET | Freq: Every day | ORAL | Status: DC
  Administered 2018-05-08 – 2018-05-11 (×4): 100 ug via ORAL
  Filled 2018-05-08 (×4): qty 1

## 2018-05-08 MED ORDER — MAGNESIUM SULFATE 2 GM/50ML IV SOLN
2.0000 g | Freq: Once | INTRAVENOUS | Status: AC
  Administered 2018-05-08: 2 g via INTRAVENOUS
  Filled 2018-05-08: qty 50

## 2018-05-08 MED ORDER — POLYETHYLENE GLYCOL 3350 17 G PO PACK
17.0000 g | PACK | Freq: Two times a day (BID) | ORAL | Status: DC
  Administered 2018-05-08 – 2018-05-12 (×7): 17 g via ORAL
  Filled 2018-05-08 (×7): qty 1

## 2018-05-08 NOTE — Progress Notes (Signed)
Occupational Therapy Evaluation Patient Details Name: [name] MRN: [id number] DOB: 1946/06/01 Today's Date: 05/08/2018    History of Present Illness pt is a 72 y/o female with h/o DDD,  bil rot cuff sx, afib, admitted with significant back and bil LE pain for degenerative scoliosis.  Pt s/p Anterolateral decompression L1-5 with fusion using XLIF and bone grafting.  Further fixated posteriorly with pedicular screws from L1-S1.   Clinical Impression   PTA. Pt modified independent with ADL and mobility. Pt moving slowly but making good progress. Completed bed mobility and sit - stand transfer with min guard A. Requires mod A for LB ADL due to back precautions. Will follow acutely to educate on ADL and functional mobility for ADL with use of AE and DME. Husband can assist as needed after DC.     Follow Up Recommendations  Home health OT;Supervision/Assistance - 24 hour(initially)    Equipment Recommendations  3 in 1 bedside commode(wide 3in1)  - to use as shower seat   Recommendations for Other Services       Precautions / Restrictions Precautions Precautions: Back Precaution Booklet Issued: Yes (comment) Required Braces or Orthoses: Spinal Brace Spinal Brace: Lumbar corset;Applied in sitting position Restrictions Weight Bearing Restrictions: No      Mobility Bed Mobility Overal bed mobility: Needs Assistance Bed Mobility: Rolling;Sidelying to Sit Rolling: Supervision Sidelying to sit: Supervision       General bed mobility comments: rolled toward L side due to groin pull  Transfers Overall transfer level: Needs assistance   Transfers: Sit to/from Stand Sit to Stand: Min guard              Balance Overall balance assessment: Needs assistance   Sitting balance-Leahy Scale: Good       Standing balance-Leahy Scale: Fair Standing balance comment: reliance on the RW and external support - able to release in standing to adjust back brace                            ADL either performed or assessed with clinical judgement   ADL Overall ADL's : Needs assistance/impaired     Grooming: Set up;Supervision/safety;Sitting   Upper Body Bathing: Set up;Sitting   Lower Body Bathing: Sit to/from stand;Moderate assistance   Upper Body Dressing : Sitting;Minimal assistance Upper Body Dressing Details (indicate cue type and reason): for donninh brace Lower Body Dressing: Moderate assistance;Sit to/from stand   Toilet Transfer: Min guard;Ambulation   Toileting- Clothing Manipulation and Hygiene: Moderate assistance;Sit to/from stand       Functional mobility during ADLs: Minimal assistance;Rolling walker;Cueing for safety General ADL Comments: Used AE PTA due to R knee surgery and back pain   .  Vision         Perception     Praxis      Pertinent Vitals/Pain Pain Assessment: 0-10 Pain Score: 5  Pain Location: L hip down back of leg to knee(L groin) Pain Descriptors / Indicators: Burning;Grimacing;Guarding;Sharp Pain Intervention(s): Limited activity within patient's tolerance     Hand Dominance Right   Extremity/Trunk Assessment Upper Extremity Assessment Upper Extremity Assessment: Overall WFL for tasks assessed   Lower Extremity Assessment Lower Extremity Assessment: Defer to PT evaluation   Cervical / Trunk Assessment Cervical / Trunk Assessment: Other exceptions(back fusion)   Communication Communication Communication: No difficulties   Cognition Arousal/Alertness: Awake/alert Behavior During Therapy: WFL for tasks assessed/performed Overall Cognitive Status: Within Functional Limits for tasks assessed  General Comments       Exercises     Shoulder Instructions      Home Living Family/patient expects to be discharged to:: Private residence Living Arrangements: Spouse/significant other Available Help at Discharge: Family;Other (Comment)(including  her sister) Type of Home: House Home Access: Stairs to enter   Entrance Stairs-Rails: Right;Left Home Layout: One level     Bathroom Shower/Tub: Occupational psychologist: Handicapped height Bathroom Accessibility: Yes How Accessible: Accessible via wheelchair Home Equipment: Weaver - 2 wheels;Walker - 4 wheels;Cane - single point;Grab bars - tub/shower;Grab bars - toilet;Shower seat - built in;Hand held shower head;Adaptive equipment Adaptive Equipment: Reacher;Sock aid;Long-handled shoe horn        Prior Functioning/Environment Level of Independence: Independent        Comments: very limited mobility lately        OT Problem List: Decreased strength;Decreased range of motion;Decreased knowledge of use of DME or AE;Decreased knowledge of precautions;Obesity;Pain      OT Treatment/Interventions: Self-care/ADL training;DME and/or AE instruction;Therapeutic activities    OT Goals(Current goals can be found in the care plan section) Acute Rehab OT Goals Patient Stated Goal: Able to do for myself and walk further OT Goal Formulation: With patient Time For Goal Achievement: 05/22/18 Potential to Achieve Goals: Good  OT Frequency: Min 3X/week   Barriers to D/C:            Co-evaluation              AM-PAC PT "6 Clicks" Daily Activity     Outcome Measure Help from another person eating meals?: None Help from another person taking care of personal grooming?: A Little Help from another person toileting, which includes using toliet, bedpan, or urinal?: A Lot Help from another person bathing (including washing, rinsing, drying)?: A Lot Help from another person to put on and taking off regular upper body clothing?: A Little Help from another person to put on and taking off regular lower body clothing?: A Lot 6 Click Score: 16   End of Session Equipment Utilized During Treatment: Gait belt;Rolling walker;Back brace Nurse Communication: Mobility  status  Activity Tolerance: Patient tolerated treatment well Patient left: Other (comment)(walking with PT)  OT Visit Diagnosis: Other abnormalities of gait and mobility (R26.89);Pain;Muscle weakness (generalized) (M62.81) Pain - part of body: (back; L groin)                Time: 1120-1140 OT Time Calculation (min): 20 min Charges:  OT General Charges $OT Visit: 1 Visit OT Evaluation $OT Eval Moderate Complexity: [place], OT/L   Acute OT Clinical Specialist [place] Pager [phone] Office [phone]   [facility] 05/08/2018, 12:15 PM

## 2018-05-08 NOTE — Progress Notes (Signed)
Physical Therapy Treatment Patient Details Name: [name] MRN: [id number] DOB: 05-Jul-1946 Today's Date: 05/08/2018    History of Present Illness pt is a 72 y/o female with h/o DDD,  bil rot cuff sx, afib, admitted with significant back and bil LE pain for degenerative scoliosis.  Pt s/p Anterolateral decompression L1-5 with fusion using XLIF and bone grafting.  Further fixated posteriorly with pedicular screws from L1-S1.    PT Comments    Pt admitted with above diagnosis. Pt currently with functional limitations due to balance and endurance deficits. Pt was able to ambulate into hallway with min guard assist with rW.  Pt doing much better today.  Pt and husband feel that they can manage at home if they go tomorrow.  Pt progressing.   Pt will benefit from skilled PT to increase their independence and safety with mobility to allow discharge to the venue listed below.     Follow Up Recommendations  Home health PT;Supervision/Assistance - 24 hour     Equipment Recommendations  3in1 (PT)    Recommendations for Other Services       Precautions / Restrictions Precautions Precautions: Back Precaution Booklet Issued: Yes (comment) Required Braces or Orthoses: Spinal Brace Spinal Brace: Lumbar corset;Applied in sitting position Restrictions Weight Bearing Restrictions: No    Mobility  Bed Mobility Overal bed mobility: Needs Assistance Bed Mobility: Rolling;Sidelying to Sit Rolling: Supervision Sidelying to sit: Supervision     Sit to sidelying: Min assist General bed mobility comments: Needed min assist to get LEs back into bed  Transfers Overall transfer level: Needs assistance   Transfers: Sit to/from Stand Sit to Stand: Min guard         General transfer comment: cues for hand placement and both boost and forward assist  Ambulation/Gait Ambulation/Gait assistance: Min guard Gait Distance (Feet): 65 Feet Assistive device: Rolling walker (2 wheeled) Gait  Pattern/deviations: Step-to pattern Gait velocity: slow Gait velocity interpretation: <1.31 ft/sec, indicative of household ambulator General Gait Details: short, tentative, step to pattern.  consistent VC for sequencing   Stairs             Wheelchair Mobility    Modified Rankin (Stroke Patients Only)       Balance Overall balance assessment: Needs assistance Sitting-balance support: No upper extremity supported;Feet supported Sitting balance-Leahy Scale: Good     Standing balance support: Bilateral upper extremity supported;During functional activity Standing balance-Leahy Scale: Poor Standing balance comment: reliance on the RW and external support                            Cognition Arousal/Alertness: Awake/alert Behavior During Therapy: WFL for tasks assessed/performed Overall Cognitive Status: Within Functional Limits for tasks assessed                                        Exercises      General Comments        Pertinent Vitals/Pain Pain Assessment: 0-10 Pain Score: 5  Pain Location: L hip down back of leg to knee(L groin) Pain Descriptors / Indicators: Burning;Grimacing;Guarding;Sharp Pain Intervention(s): Limited activity within patient's tolerance;Monitored during session;Premedicated before session;Repositioned    Home Living Family/patient expects to be discharged to:: Private residence Living Arrangements: Spouse/significant other Available Help at Discharge: Family;Other (Comment)(including her sister) Type of Home: House Home Access: Stairs to enter Entrance Stairs-Rails: Right;Left  Home Layout: One level Home Equipment: Walker - 2 wheels;Walker - 4 wheels;Cane - single point;Grab bars - tub/shower;Grab bars - toilet;Shower seat - built in;Hand held shower head;Adaptive equipment      Prior Function Level of Independence: Independent      Comments: very limited mobility lately   PT Goals (current goals  can now be found in the care plan section) Acute Rehab PT Goals Patient Stated Goal: Able to do for myself and walk further Progress towards PT goals: Progressing toward goals    Frequency    Min 5X/week      PT Plan Discharge plan needs to be updated    Co-evaluation              AM-PAC PT "6 Clicks" Daily Activity  Outcome Measure  Difficulty turning over in bed (including adjusting bedclothes, sheets and blankets)?: None Difficulty moving from lying on back to sitting on the side of the bed? : None Difficulty sitting down on and standing up from a chair with arms (e.g., wheelchair, bedside commode, etc,.)?: A Little Help needed moving to and from a bed to chair (including a wheelchair)?: A Little Help needed walking in hospital room?: A Little Help needed climbing 3-5 steps with a railing? : A Lot 6 Click Score: 19    End of Session Equipment Utilized During Treatment: Back brace Activity Tolerance: Patient tolerated treatment well;Patient limited by pain Patient left: in bed;with call bell/phone within reach;with family/visitor present Nurse Communication: Mobility status PT Visit Diagnosis: Unsteadiness on feet (R26.81);Other abnormalities of gait and mobility (R26.89);Muscle weakness (generalized) (M62.81);Pain Pain - Right/Left: Left Pain - part of body: (incision, left leg)     Time: 1779-3903 PT Time Calculation (min) (ACUTE ONLY): 19 min  Charges:  $Gait Training: 8-22 mins                     [place] Pager:  [phone]  Office:  [place] 05/08/2018, 2:24 PM

## 2018-05-08 NOTE — Progress Notes (Signed)
PROGRESS NOTE    [name]  URK:[id number] DOB: 08-21-1945 DOA: 05/06/2018 PCP: Jani Gravel, MD    Brief Narrative: [name] is a 72 y.o. female who had back pain and progressive scoliosis; therefore, admitted 10/25 for surgery.  Post op remained intubated so PCCM called for assistance.  Assessment & Plan:   Active Problems:   Respiratory failure (HCC)   Degenerative scoliosis   Scoliosis deformity of spine   1-Acute Respiratory failure;  Unable to be extubated in PACU, due to facial/neck edema after being prone for 8 hours for surgery.  S/P extubation 10-26.  Oxygen saturation on RA 98 %.   Hypotension; intra and post operative thought to be related to sedation.  Resolved.  Lactic acid normalized.   History of A fib Was on eliquis at home. Defer to neurosurgery timing to start anticoagulation.  Continue with Cardizem.   History of first degree block   Leukocytosis;  On IV steroids.  Afebrile.  Trending down.   Acute blood loss Anemia;  Post op.  Monitor hb,  Checking anemia panel.  B 12 low normal, start supplement.  Will also start ferrous sulfate when constipation resolved.  If hb continue to drop, will need blood transfusion.   Lactic acidosis;  Resolved.   Hypomagnesemia;  Replace  Hyperglycemia;  In setting steroids.  Improving, steroids taper.    DVT prophylaxis: SCD Code Status:Full code.  Family Communication:  Disposition Plan: CIR recommended by PT   Consultants:   Neurosurgery is Primary.   CCM sign off 10-26   Procedures; events ETT 10/25 >> 10/26 10/25  Admit, anterolateral decompression at L1-L5 with posterior percutaneous pedicular screw fixation from L1-S1.   Antimicrobials:   none   Subjective: Complaining of back pain.  No BM since before surgery    Objective: Vitals:   05/07/18 2200 05/07/18 2343 05/08/18 0000 05/08/18 0300  BP: 107/60  (!) 100/55 (!) 150/73  Pulse: 76  67 70  Resp: 11   13    Temp:  97.6 F (36.4 C)  98.2 F (36.8 C)  TempSrc:  Oral  Oral  SpO2: 92%  96% 95%  Weight:      Height:        Intake/Output Summary (Last 24 hours) at 05/08/2018 0722 Last data filed at 05/07/2018 2100 Gross per 24 hour  Intake 1782.12 ml  Output -  Net 1782.12 ml   Filed Weights   05/06/18 0542 05/06/18 2030  Weight: 98.4 kg 117.6 kg    Examination:  General exam: Appears calm and comfortable  Respiratory system: Clear to auscultation. Respiratory effort normal. Cardiovascular system: S1 & S2 heard, RRR. No JVD, murmurs, rubs, gallops or clicks. No pedal edema. Gastrointestinal system: Abdomen is nondistended, soft and nontender. No organomegaly or masses felt. Normal bowel sounds heard. Central nervous system: Alert and oriented.  Extremities: Symmetric 5 x 5 power. Skin: No rashes, lesions or ulcers    Data Reviewed: I have personally reviewed following labs and imaging studies  CBC: Recent Labs  Lab 05/06/18 2247 05/07/18 1004  WBC 19.3* 16.1*  HGB 9.7* 8.6*  HCT 31.2* 27.8*  MCV 90.7 90.8  PLT 274 831   Basic Metabolic Panel: Recent Labs  Lab 05/06/18 2247 05/07/18 1004  NA 137 139  K 4.0 4.2  CL 106 105  CO2 20* 22  GLUCOSE 290* 210*  BUN 15 17  CREATININE 0.89 0.89  CALCIUM 8.2* 8.5*  MG 1.6* 1.7  PHOS 4.0 2.6  GFR: Estimated Creatinine Clearance: 72.2 mL/min (by C-G formula based on SCr of 0.89 mg/dL). Liver Function Tests: Recent Labs  Lab 05/06/18 2247  AST 71*  ALT 54*  ALKPHOS 79  BILITOT 1.0  PROT 5.6*  ALBUMIN 3.6   No results for input(s): LIPASE, AMYLASE in the last 168 hours. No results for input(s): AMMONIA in the last 168 hours. Coagulation Profile: No results for input(s): INR, PROTIME in the last 168 hours. Cardiac Enzymes: Recent Labs  Lab 05/06/18 2247  CKTOTAL 827*  CKMB 17.5*   BNP (last 3 results) No results for input(s): PROBNP in the last 8760 hours. HbA1C: No results for input(s): HGBA1C in the  last 72 hours. CBG: Recent Labs  Lab 05/06/18 2337 05/07/18 1331 05/07/18 1603 05/07/18 2119  GLUCAP 274* 163* 156* 171*   Lipid Profile: No results for input(s): CHOL, HDL, LDLCALC, TRIG, CHOLHDL, LDLDIRECT in the last 72 hours. Thyroid Function Tests: No results for input(s): TSH, T4TOTAL, FREET4, T3FREE, THYROIDAB in the last 72 hours. Anemia Panel: No results for input(s): VITAMINB12, FOLATE, FERRITIN, TIBC, IRON, RETICCTPCT in the last 72 hours. Sepsis Labs: Recent Labs  Lab 05/06/18 2247 05/07/18 1004  LATICACIDVEN 4.8* 3.8*    Recent Results (from the past 240 hour(s))  Surgical pcr screen     Status: None   Collection Time: 04/29/18  9:20 AM  Result Value Ref Range Status   MRSA, PCR NEGATIVE NEGATIVE Final   Staphylococcus aureus NEGATIVE NEGATIVE Final    Comment: (NOTE) The Xpert SA Assay (FDA approved for NASAL specimens in patients 62 years of age and older), is one component of a comprehensive surveillance program. It is not intended to diagnose infection nor to guide or monitor treatment. Performed at [facility], [place] [address] [place], [place] [address]          Radiology Studies: Dg Lumbar Spine 2-3 Views  Result Date: 05/06/2018 CLINICAL DATA:  Low back pain. EXAM: LUMBAR SPINE - 2-3 VIEW; DG C-ARM 61-120 MIN COMPARISON:  MRI lumbar spine 10/01/2017. FINDINGS: Intraoperative C-arm films demonstrate L1 through L5 indirect anterolateral decompression, with posterior fixation. Pedicle screws are seen BILATERAL at L1 through L5, with additional screws placed into the first sacral segment, all attached to connecting rod. There is improvement in degenerative scoliosis. The RIGHT S1 screw approaches the foramen. IMPRESSION: Intraoperative radiographs as described. Electronically Signed   By: Staci Righter M.D.   On: 05/06/2018 16:31   Dg Chest Port 1 View  Result Date: 05/06/2018 CLINICAL DATA:  Endotracheal tube placement EXAM: PORTABLE CHEST  1 VIEW COMPARISON:  None. FINDINGS: Endotracheal tube tip at the clavicular heads. Cardiopericardial enlargement. Hazy and somewhat streaky density at the left base. Lung volumes are low. No edema. No pneumothorax. IMPRESSION: 1. Endotracheal tube in good position. 2. Cardiomegaly. 3. Low volumes with probable mild atelectasis at the left base. Electronically Signed   By: Monte Fantasia M.D.   On: 05/06/2018 19:46   Dg C-arm 1-60 Min  Result Date: 05/06/2018 CLINICAL DATA:  Low back pain. EXAM: LUMBAR SPINE - 2-3 VIEW; DG C-ARM 61-120 MIN COMPARISON:  MRI lumbar spine 10/01/2017. FINDINGS: Intraoperative C-arm films demonstrate L1 through L5 indirect anterolateral decompression, with posterior fixation. Pedicle screws are seen BILATERAL at L1 through L5, with additional screws placed into the first sacral segment, all attached to connecting rod. There is improvement in degenerative scoliosis. The RIGHT S1 screw approaches the foramen. IMPRESSION: Intraoperative radiographs as described. Electronically Signed   By:  Staci Righter M.D.   On: 05/06/2018 16:31   Dg C-arm 1-60 Min  Result Date: 05/06/2018 CLINICAL DATA:  Low back pain. EXAM: LUMBAR SPINE - 2-3 VIEW; DG C-ARM 61-120 MIN COMPARISON:  MRI lumbar spine 10/01/2017. FINDINGS: Intraoperative C-arm films demonstrate L1 through L5 indirect anterolateral decompression, with posterior fixation. Pedicle screws are seen BILATERAL at L1 through L5, with additional screws placed into the first sacral segment, all attached to connecting rod. There is improvement in degenerative scoliosis. The RIGHT S1 screw approaches the foramen. IMPRESSION: Intraoperative radiographs as described. Electronically Signed   By: Staci Righter M.D.   On: 05/06/2018 16:31   Dg C-arm 1-60 Min  Result Date: 05/06/2018 CLINICAL DATA:  Low back pain. EXAM: LUMBAR SPINE - 2-3 VIEW; DG C-ARM 61-120 MIN COMPARISON:  MRI lumbar spine 10/01/2017. FINDINGS: Intraoperative C-arm films  demonstrate L1 through L5 indirect anterolateral decompression, with posterior fixation. Pedicle screws are seen BILATERAL at L1 through L5, with additional screws placed into the first sacral segment, all attached to connecting rod. There is improvement in degenerative scoliosis. The RIGHT S1 screw approaches the foramen. IMPRESSION: Intraoperative radiographs as described. Electronically Signed   By: Staci Righter M.D.   On: 05/06/2018 16:31        Scheduled Meds: . dexamethasone  4 mg Intravenous Q24H  . diltiazem  120 mg Oral Daily  . docusate sodium  100 mg Oral BID  . montelukast  10 mg Oral QHS  . senna  1 tablet Oral BID  . sodium chloride flush  3 mL Intravenous Q12H   Continuous Infusions: . sodium chloride    . sodium chloride Stopped (05/07/18 2046)  . sodium chloride Stopped (05/07/18 2045)  . famotidine (PEPCID) IV 20 mg (05/07/18 2135)  . methocarbamol (ROBAXIN) IV 100 mL/hr at 05/07/18 2100     LOS: 2 days    Time spent: 35 minutes.     Elmarie Shiley, MD [facility] Pager [phone]  If 7PM-7AM, please contact night-coverage [email or web address] Password [email or web address] 05/08/2018, 7:22 AM

## 2018-05-09 ENCOUNTER — Inpatient Hospital Stay (HOSPITAL_COMMUNITY): Payer: PPO

## 2018-05-09 DIAGNOSIS — D72829 Elevated white blood cell count, unspecified: Secondary | ICD-10-CM

## 2018-05-09 DIAGNOSIS — R7303 Prediabetes: Secondary | ICD-10-CM

## 2018-05-09 DIAGNOSIS — I482 Chronic atrial fibrillation, unspecified: Secondary | ICD-10-CM

## 2018-05-09 DIAGNOSIS — D62 Acute posthemorrhagic anemia: Secondary | ICD-10-CM

## 2018-05-09 DIAGNOSIS — I5032 Chronic diastolic (congestive) heart failure: Secondary | ICD-10-CM

## 2018-05-09 DIAGNOSIS — Z981 Arthrodesis status: Secondary | ICD-10-CM

## 2018-05-09 DIAGNOSIS — G8918 Other acute postprocedural pain: Secondary | ICD-10-CM

## 2018-05-09 LAB — BASIC METABOLIC PANEL
Anion gap: 5 (ref 5–15)
BUN: 18 mg/dL (ref 8–23)
CO2: 23 mmol/L (ref 22–32)
CREATININE: 0.78 mg/dL (ref 0.44–1.00)
Calcium: 8.4 mg/dL — ABNORMAL LOW (ref 8.9–10.3)
Chloride: 112 mmol/L — ABNORMAL HIGH (ref 98–111)
GFR calc Af Amer: >60 mL/min (ref >60)
GLUCOSE: 166 mg/dL — AB (ref 70–99)
Potassium: 4.3 mmol/L (ref 3.5–5.1)
Sodium: 140 mmol/L (ref 135–145)

## 2018-05-09 LAB — GLUCOSE, CAPILLARY
GLUCOSE-CAPILLARY: 145 mg/dL — AB (ref 70–99)
Glucose-Capillary: 131 mg/dL — ABNORMAL HIGH (ref 70–99)
Glucose-Capillary: 165 mg/dL — ABNORMAL HIGH (ref 70–99)
Glucose-Capillary: 135 mg/dL — ABNORMAL HIGH (ref 70–99)

## 2018-05-09 LAB — HEMOGLOBIN AND HEMATOCRIT, BLOOD
HEMATOCRIT: 29.6 % — AB (ref 36.0–46.0)
HEMOGLOBIN: 9.2 g/dL — AB (ref 12.0–15.0)

## 2018-05-09 LAB — CBC
HCT: 24.7 % — ABNORMAL LOW (ref 36.0–46.0)
HEMOGLOBIN: 7.6 g/dL — AB (ref 12.0–15.0)
MCH: 28.3 pg (ref 26.0–34.0)
MCHC: 30.8 g/dL (ref 30.0–36.0)
MCV: 91.8 fL (ref 80.0–100.0)
Platelets: 251 10*3/uL (ref 150–400)
RBC: 2.69 MIL/uL — AB (ref 3.87–5.11)
RDW: 13.9 % (ref 11.5–15.5)
WBC: 12.4 10*3/uL — ABNORMAL HIGH (ref 4.0–10.5)
nRBC: 0 % (ref 0.0–0.2)

## 2018-05-09 LAB — PREPARE RBC (CROSSMATCH)

## 2018-05-09 MED ORDER — MAGNESIUM HYDROXIDE 400 MG/5ML PO SUSP
15.0000 mL | Freq: Every day | ORAL | Status: AC
  Administered 2018-05-09: 15 mL via ORAL
  Filled 2018-05-09: qty 30

## 2018-05-09 MED ORDER — APIXABAN 5 MG PO TABS
5.0000 mg | ORAL_TABLET | Freq: Two times a day (BID) | ORAL | Status: DC
  Administered 2018-05-09 – 2018-05-12 (×6): 5 mg via ORAL
  Filled 2018-05-09 (×6): qty 1

## 2018-05-09 MED ORDER — BISACODYL 5 MG PO TBEC
5.0000 mg | DELAYED_RELEASE_TABLET | Freq: Once | ORAL | Status: AC
  Administered 2018-05-09: 5 mg via ORAL
  Filled 2018-05-09: qty 1

## 2018-05-09 MED ORDER — SODIUM CHLORIDE 0.9% IV SOLUTION
Freq: Once | INTRAVENOUS | Status: AC
  Administered 2018-05-09: 11:00:00 via INTRAVENOUS

## 2018-05-09 MED FILL — Gelatin Absorbable MT Powder: OROMUCOSAL | Qty: 1 | Status: AC

## 2018-05-09 MED FILL — Thrombin For Soln 5000 Unit: CUTANEOUS | Qty: 5000 | Status: AC

## 2018-05-09 NOTE — Progress Notes (Signed)
PROGRESS NOTE    [name]  ERX:[id number] DOB: 1945/09/19 DOA: 05/06/2018 PCP: Jani Gravel, MD    Brief Narrative: [name] is a 72 y.o. female who had back pain and progressive scoliosis; therefore, admitted 10/25 for surgery.  Post op remained intubated so PCCM called for assistance.  Assessment & Plan:   Active Problems:   Respiratory failure (HCC)   Degenerative scoliosis   Scoliosis deformity of spine   Chronic atrial fibrillation   Chronic diastolic congestive heart failure (HCC)   Prediabetes   Post-operative pain   History of fusion of cervical spine   Acute blood loss anemia   Leukocytosis   1-Acute Respiratory failure;  Unable to be extubated in PACU, due to facial/neck edema after being prone for 8 hours for surgery.  S/P extubation 10-26.  Oxygen saturation on RA 98 %.   Hypotension; intra and post operative thought to be related to sedation.  Resolved.  Lactic acid normalized.   History of A fib Was on eliquis at home. Defer to neurosurgery timing to start anticoagulation.  Continue with Cardizem.   History of first degree block   Leukocytosis;  On IV steroids.  Afebrile.  Trending down.   Acute blood loss Anemia;  Post op.  Monitor hb,  Checking anemia panel.  B 12 low normal, started  supplement.  Will also start ferrous sulfate when constipation resolved.  Will transfuse one unit PRBC. Patient symptomatic from anemia   Lactic acidosis;  Resolved.   Hypomagnesemia;  Replace  Hyperglycemia;  In setting steroids.  Improving, steroids taper.    DVT prophylaxis: SCD Code Status:Full code.  Family Communication:  Disposition Plan: CIR recommended by PT   Consultants:   Neurosurgery is Primary.   CCM sign off 10-26   Procedures; events ETT 10/25 >> 10/26 10/25  Admit, anterolateral decompression at L1-L5 with posterior percutaneous pedicular screw fixation from L1-S1.   Antimicrobials:    none   Subjective: She is sitting in recline.  No BM yet.  Got really tired waking to the door.    Objective: Vitals:   05/09/18 1115 05/09/18 1130 05/09/18 1200 05/09/18 1249  BP:  (!) 137/55  (!) 143/63  Pulse: 68 65 64 64  Resp: 19 17 16 19   Temp:  97.8 F (36.6 C) 97.8 F (36.6 C) (!) 97.4 F (36.3 C)  TempSrc:  Axillary  Axillary  SpO2: 100% 100% 100% 100%  Weight:      Height:        Intake/Output Summary (Last 24 hours) at 05/09/2018 1408 Last data filed at 05/09/2018 1249 Gross per 24 hour  Intake 873.25 ml  Output -  Net 873.25 ml   Filed Weights   05/06/18 0542 05/06/18 2030  Weight: 98.4 kg 117.6 kg    Examination:  General exam: NAD Respiratory system; CTA Cardiovascular system: S 1, , S 2 RRR Gastrointestinal system: BS present, soft, nt Central nervous system: Alert and oriented.  Extremities: no edema Skin: no rashes.     Data Reviewed: I have personally reviewed following labs and imaging studies  CBC: Recent Labs  Lab 05/06/18 2247 05/07/18 1004 05/08/18 0823 05/09/18 0555  WBC 19.3* 16.1* 14.7* 12.4*  HGB 9.7* 8.6* 7.9* 7.6*  HCT 31.2* 27.8* 25.7* 24.7*  MCV 90.7 90.8 92.1 91.8  PLT 274 269 252 950   Basic Metabolic Panel: Recent Labs  Lab 05/06/18 2247 05/07/18 1004 05/09/18 0555  NA 137 139 140  K 4.0 4.2 4.3  CL 106 105 112*  CO2 20* 22 23  GLUCOSE 290* 210* 166*  BUN 15 17 18   CREATININE 0.89 0.89 0.78  CALCIUM 8.2* 8.5* 8.4*  MG 1.6* 1.7  --   PHOS 4.0 2.6  --    GFR: Estimated Creatinine Clearance: 80.3 mL/min (by C-G formula based on SCr of 0.78 mg/dL). Liver Function Tests: Recent Labs  Lab 05/06/18 2247  AST 71*  ALT 54*  ALKPHOS 79  BILITOT 1.0  PROT 5.6*  ALBUMIN 3.6   No results for input(s): LIPASE, AMYLASE in the last 168 hours. No results for input(s): AMMONIA in the last 168 hours. Coagulation Profile: No results for input(s): INR, PROTIME in the last 168 hours. Cardiac  Enzymes: Recent Labs  Lab 05/06/18 2247  CKTOTAL 827*  CKMB 17.5*   BNP (last 3 results) No results for input(s): PROBNP in the last 8760 hours. HbA1C: No results for input(s): HGBA1C in the last 72 hours. CBG: Recent Labs  Lab 05/08/18 1211 05/08/18 1627 05/08/18 2103 05/09/18 0751 05/09/18 1140  GLUCAP 167* 182* 169* 145* 131*   Lipid Profile: No results for input(s): CHOL, HDL, LDLCALC, TRIG, CHOLHDL, LDLDIRECT in the last 72 hours. Thyroid Function Tests: No results for input(s): TSH, T4TOTAL, FREET4, T3FREE, THYROIDAB in the last 72 hours. Anemia Panel: Recent Labs    05/08/18 0823  VITAMINB12 271  FOLATE 5.9*  FERRITIN 240  TIBC 256  IRON 47  RETICCTPCT 1.6   Sepsis Labs: Recent Labs  Lab 05/06/18 2247 05/07/18 1004 05/08/18 0823  LATICACIDVEN 4.8* 3.8* 1.8    No results found for this or any previous visit (from the past 240 hour(s)).       Radiology Studies: No results found.      Scheduled Meds: . dexamethasone  4 mg Intravenous Q24H  . diltiazem  120 mg Oral Daily  . docusate sodium  100 mg Oral BID  . montelukast  10 mg Oral QHS  . polyethylene glycol  17 g Oral BID  . senna  1 tablet Oral BID  . sodium chloride flush  3 mL Intravenous Q12H  . vitamin B-12  100 mcg Oral Daily   Continuous Infusions: . sodium chloride    . sodium chloride Stopped (05/09/18 0807)  . methocarbamol (ROBAXIN) IV Stopped (05/07/18 2117)     LOS: 3 days    Time spent: 35 minutes.     Elmarie Shiley, MD [facility] Pager [phone]  If 7PM-7AM, please contact night-coverage [email or web address] Password [email or web address] 05/09/2018, 2:08 PM

## 2018-05-09 NOTE — Consult Note (Signed)
Physical Medicine and Rehabilitation Consult Reason for Consult: Decreased functional mobility Referring Physician: Dr. Ellene Route   HPI: [name] is a 72 y.o. right-handed female with history of chronic atrial fibrillation as well as first-degree AV block maintained on Eliquis, chronic diastolic congestive heart failure, prediabetes, ACDF 2012.  Per chart review and patient, patient lives with spouse.  One level home.  Independent prior to admission and recently started using a cane.  Husband works during the day.  Good support of extended family.  Presented 05/06/2018 with low back pain radiating to the lower extremities.  X-rays and imaging revealed involvement of L1-2, 3-4 and 4-5 degenerative scoliosis/spinal stenosis with radiculopathy.  Underwent decompression L1-2, 3-4, 4-5 interbody arthrodesis and fusion 05/06/2018 per Dr. Ellene Route.  Hospital course pain management.  Back brace when out of bed applied in sitting position.  Hospital course acute blood loss anemia 7.9.  Discussed with attending physician, with orders for transfusion. Patient difficult to extubate in PACU with critical care consulted she was later extubated 05/07/2018 initial elevation in lactic acid 4.8 normalized to 1.8.  Noted to be hypotensive felt to be related to sedation that did improve.  Await plan to resume chronic Eliquis per neurosurgery.  Therapy evaluations completed with recommendations of physical medicine rehab consult.  Review of Systems  Constitutional: Negative for chills and fever.  HENT: Negative for hearing loss.   Eyes: Negative for blurred vision and double vision.  Respiratory: Negative for cough and shortness of breath.   Cardiovascular: Positive for leg swelling. Negative for chest pain.  Gastrointestinal: Positive for constipation. Negative for nausea and vomiting.  Genitourinary: Negative for dysuria, flank pain and hematuria.  Musculoskeletal: Positive for back pain, joint pain and  myalgias.  Skin: Negative for rash.  All other systems reviewed and are negative.  Past Medical History:  Diagnosis Date  . Acute hypoxemic respiratory failure ([place]) 10/24/2016  . Arthritis    both knees  . Atrial fibrillation, chronic   . Carpal tunnel syndrome, bilateral    both hands go to sleep at times from elbows down  . Chronic diastolic heart failure ([place])   . DDD (degenerative disc disease), lumbosacral   . Dyspnea   . First degree AV block   . Hematuria 11/15/2017  . History of kidney stones   . History of rib fracture 03/2011  . Lumbar stenosis   . Obesity   . Postlaminectomy syndrome   . Pre-diabetes   . Renal calculi    BILATERAL  . Right ureteral stone   . Rotator cuff tear, left   . Rotator cuff tear, right    Past Surgical History:  Procedure Laterality Date  . ANTERIOR CERVICAL DECOMP/DISCECTOMY FUSION  03-18-2011   C3 -- C7  . APPENDECTOMY    . BILATERAL CARPAL TUNNEL RELEASE     3 screws in right hand, thumb, middle finger, and index finger,   . CARDIAC CATHETERIZATION N/A 01/16/2015   Procedure: Left Heart Cath and Coronary Angiography;  Surgeon: Belva Crome, MD;  Location: [facility] [facility];  Service: Cardiovascular;  Laterality: N/A;  . CHOLECYSTECTOMY OPEN  1970'S  . COLONOSCOPY  2018  . CYSTOSCOPY WITH RETROGRADE PYELOGRAM, URETEROSCOPY AND STENT PLACEMENT N/A 02/13/2013   Procedure: CYSTOSCOPY WITH RETROGRADE PYELOGRAM, URETEROSCOPY AND LITHOTRIPSY ;  Surgeon: Claybon Jabs, MD;  Location: [facility];  Service: Urology;  Laterality: N/A;  . CYSTOSCOPY/URETEROSCOPY/HOLMIUM LASER/STENT PLACEMENT Right 11/22/2017   Procedure: CYSTOSCOPY/URETEROSCOPY/RETROGRADE PYELOGRAM/HOLMIUM LASER/STENT  PLACEMENT;  Surgeon: Kathie Rhodes, MD;  Location: [facility];  Service: Urology;  Laterality: Right;  . EXTRACORPOREAL SHOCK WAVE LITHOTRIPSY Right 05-16-2012  . FINGER ARTHROSCOPY WITH CARPOMETACARPEL ([facility]) ARTHROPLASTY    .  HOLMIUM LASER APPLICATION Right 02/14/1323   Procedure: HOLMIUM LASER APPLICATION;  Surgeon: Claybon Jabs, MD;  Location: [facility];  Service: Urology;  Laterality: Right;  . KNEE ARTHROSCOPY Bilateral   . LUMBAR LAMINECTOMY  X2  1960'S  . NASAL SEPTUM SURGERY  1970'S  . SHOULDER OPEN ROTATOR CUFF REPAIR Bilateral LEFT 01-11-2001/   RIGHT 07-21-2002  . TOTAL KNEE ARTHROPLASTY  04/13/2012   Procedure: TOTAL KNEE ARTHROPLASTY;  Surgeon: Gearlean Alf, MD;  Location: [facility];  Service: Orthopedics;  Laterality: Left;  . TOTAL KNEE ARTHROPLASTY  08/08/2012   Procedure: TOTAL KNEE ARTHROPLASTY;  Surgeon: Gearlean Alf, MD;  Location: [facility];  Service: Orthopedics;  Laterality: Right;  . TRANSTHORACIC ECHOCARDIOGRAM  03-29-2011   MODERATE LVH/ EF 60-65%/ MILD MR  . VAGINAL HYSTERECTOMY  1960'S   Family History  Problem Relation Age of Onset  . Diabetes type II Mother   . Diabetes Sister   . Hypertension Sister   . Lung cancer Father   . Hypertension Brother   . Diabetes Brother   . Heart disease Brother   . Diabetes Brother   . Hypertension Brother   . Heart disease Brother    Social History:  reports that she has never smoked. She has never used smokeless tobacco. She reports that she does not drink alcohol or use drugs. Allergies:  Allergies  Allergen Reactions  . Tape Swelling    Adhesive Tape   Medications Prior to Admission  Medication Sig Dispense Refill  . acetaminophen (TYLENOL) 325 MG tablet Take 325-650 mg by mouth every 6 (six) hours as needed for mild pain.     Marland Kitchen apixaban (ELIQUIS) 5 MG TABS tablet Take 1 tablet (5 mg total) by mouth 2 (two) times daily. 180 tablet 3  . diclofenac sodium (VOLTAREN) 1 % GEL Apply 2 g topically 3 (three) times daily as needed (back pain).     Marland Kitchen diltiazem (CARTIA XT) 120 MG 24 hr capsule Take 1 capsule (120 mg total) by mouth daily. 90 capsule 3  . montelukast (SINGULAIR) 10 MG tablet Take 10 mg by mouth at bedtime.       Home: Home Living Family/patient expects to be discharged to:: Private residence Living Arrangements: Spouse/significant other Available Help at Discharge: Family, Other (Comment)(including her sister) Type of Home: House Home Access: Stairs to enter Entrance Stairs-Rails: Right, Left Home Layout: One level Bathroom Shower/Tub: Multimedia programmer: Handicapped height Bathroom Accessibility: Yes Home Equipment: Environmental consultant - 2 wheels, Walker - 4 wheels, Cane - single point, Grab bars - tub/shower, Grab bars - toilet, Shower seat - built in, [employer] held shower head, Financial controller: Reacher, Sock aid, Long-handled shoe horn  Functional History: Prior Function Level of Independence: Independent Comments: very limited mobility lately Functional Status:  Mobility: Bed Mobility Overal bed mobility: Needs Assistance Bed Mobility: Rolling, Sidelying to Sit Rolling: Supervision Sidelying to sit: Supervision Sit to sidelying: Min assist General bed mobility comments: Needed min assist to get LEs back into bed Transfers Overall transfer level: Needs assistance Transfers: Sit to/from Stand Sit to Stand: Min guard General transfer comment: cues for hand placement and both boost and forward assist Ambulation/Gait Ambulation/Gait assistance: Min guard Gait Distance (Feet): 65 Feet Assistive device:  Rolling walker (2 wheeled) Gait Pattern/deviations: Step-to pattern General Gait Details: short, tentative, step to pattern.  consistent VC for sequencing Gait velocity: slow Gait velocity interpretation: <1.31 ft/sec, indicative of household ambulator    ADL: ADL Overall ADL's : Needs assistance/impaired Grooming: Set up, Supervision/safety, Sitting Upper Body Bathing: Set up, Sitting Lower Body Bathing: Sit to/from stand, Moderate assistance Upper Body Dressing : Sitting, Minimal assistance Upper Body Dressing Details (indicate cue type and reason): for  donninh brace Lower Body Dressing: Moderate assistance, Sit to/from stand Toilet Transfer: Min guard, Ambulation Toileting- Clothing Manipulation and Hygiene: Moderate assistance, Sit to/from stand Functional mobility during ADLs: Minimal assistance, Rolling walker, Cueing for safety General ADL Comments: Used AE PTA due to R knee surgery adn back pain  Cognition: Cognition Overall Cognitive Status: Within Functional Limits for tasks assessed Orientation Level: Oriented X4 Cognition Arousal/Alertness: Awake/alert Behavior During Therapy: WFL for tasks assessed/performed Overall Cognitive Status: Within Functional Limits for tasks assessed  Blood pressure (!) 142/70, pulse 73, temperature 97.8 F (36.6 C), temperature source Oral, resp. rate 10, height 5\' 5"  (1.651 m), weight 117.6 kg, SpO2 99 %. Physical Exam  Vitals reviewed. Constitutional: She is oriented to person, place, and time. She appears well-developed.  Morbidly obese  HENT:  Head: Normocephalic and atraumatic.  Eyes: EOM are normal. Right eye exhibits no discharge. Left eye exhibits no discharge.  Neck: Normal range of motion. Neck supple. No thyromegaly present.  Cardiovascular:  Irregularly irregular  Respiratory: Effort normal and breath sounds normal. No respiratory distress.  GI: Soft. She exhibits no distension.  Bowel sounds slowed  Musculoskeletal:  LUE, B/l LE edema  Neurological: She is alert and oriented to person, place, and time.  Motor: B/l UE 5/5 proximal to distal B/l LE: HF 2+/5, KE 4-/5, ADF 4/5  Skin:  Back incision is dressed  Psychiatric: She has a normal mood and affect. Her behavior is normal. Thought content normal.    Results for orders placed or performed during the hospital encounter of 05/06/18 (from the past 24 hour(s))  CBC     Status: Abnormal   Collection Time: 05/08/18  8:23 AM  Result Value Ref Range   WBC 14.7 (H) 4.0 - 10.5 K/uL   RBC 2.79 (L) 3.87 - 5.11 MIL/uL    Hemoglobin 7.9 (L) 12.0 - 15.0 g/dL   HCT 25.7 (L) 36.0 - 46.0 %   MCV 92.1 80.0 - 100.0 fL   MCH 28.3 26.0 - 34.0 pg   MCHC 30.7 30.0 - 36.0 g/dL   RDW 13.8 11.5 - 15.5 %   Platelets 252 150 - 400 K/uL   nRBC 0.0 0.0 - 0.2 %  Vitamin B12     Status: None   Collection Time: 05/08/18  8:23 AM  Result Value Ref Range   Vitamin B-12 271 180 - 914 pg/mL  Folate     Status: Abnormal   Collection Time: 05/08/18  8:23 AM  Result Value Ref Range   Folate 5.9 (L) >5.9 ng/mL  Iron and TIBC     Status: None   Collection Time: 05/08/18  8:23 AM  Result Value Ref Range   Iron 47 28 - 170 ug/dL   TIBC 256 250 - 450 ug/dL   Saturation Ratios 18 10.4 - 31.8 %   UIBC 209 ug/dL  Ferritin     Status: None   Collection Time: 05/08/18  8:23 AM  Result Value Ref Range   Ferritin 240 11 - 307 ng/mL  Reticulocytes     Status: Abnormal   Collection Time: 05/08/18  8:23 AM  Result Value Ref Range   Retic Ct Pct 1.6 0.4 - 3.1 %   RBC. 2.79 (L) 3.87 - 5.11 MIL/uL   Retic Count, Absolute 44.4 19.0 - 186.0 K/uL   Immature Retic Fract 5.5 2.3 - 15.9 %  Lactic acid, plasma     Status: None   Collection Time: 05/08/18  8:23 AM  Result Value Ref Range   Lactic Acid, Venous 1.8 0.5 - 1.9 mmol/L  Glucose, capillary     Status: Abnormal   Collection Time: 05/08/18  9:11 AM  Result Value Ref Range   Glucose-Capillary 164 (H) 70 - 99 mg/dL   Comment 1 Notify RN    Comment 2 Document in Chart   Glucose, capillary     Status: Abnormal   Collection Time: 05/08/18 12:11 PM  Result Value Ref Range   Glucose-Capillary 167 (H) 70 - 99 mg/dL  Glucose, capillary     Status: Abnormal   Collection Time: 05/08/18  4:27 PM  Result Value Ref Range   Glucose-Capillary 182 (H) 70 - 99 mg/dL   Comment 1 Notify RN    Comment 2 Document in Chart   Glucose, capillary     Status: Abnormal   Collection Time: 05/08/18  9:03 PM  Result Value Ref Range   Glucose-Capillary 169 (H) 70 - 99 mg/dL   No results  found.  Assessment/Plan: Diagnosis: Lumbar stenosis with radiculopathy s/p decompression Labs independently reviewed.  Records reviewed and summated above.  1. Does the need for close, 24 hr/day medical supervision in concert with the patient's rehab needs make it unreasonable for this patient to be served in a less intensive setting? No  2. Co-Morbidities requiring supervision/potential complications: chronic atrial fibrillation (resume meds when appropriate, monitor HR with increased physical activity), chronic diastolic congestive heart failure (monitor for signs/symptoms of fluid overload), prediabetes (Monitor in accordance with exercise and adjust meds as necessary), ACDF 2012, post-op pain management (Biofeedback training with therapies to help reduce reliance on opiate pain medications, monitor pain control during therapies, and sedation at rest and titrate to maximum efficacy to ensure participation and gains in therapies), acute blood loss anemia (transfusion today, ensure appropriate perfusion for increased activity tolerance), leukocytosis (repeat labs, cont to monitor for signs and symptoms of infection, further workup if indicated) 3. Due to bladder management, safety, skin/wound care, disease management, pain management and patient education, does the patient require 24 hr/day rehab nursing? No 4. Does the patient require coordinated care of a physician, rehab nurse, PT (1-2 hrs/day, 5 days/week) and OT (1-2 hrs/day, 5 days/week) to address physical and functional deficits in the context of the above medical diagnosis(es)? No Addressing deficits in the following areas: balance, endurance, locomotion, strength, bathing, dressing, toileting and psychosocial support 5. Can the patient actively participate in an intensive therapy program of at least 3 hrs of therapy per day at least 5 days per week? Yes 6. The potential for patient to make measurable gains while on inpatient rehab is  good 7. Anticipated functional outcomes upon discharge from inpatient rehab are n/a  with PT, n/a with OT, n/a with SLP. 8. Estimated rehab length of stay to reach the above functional goals is: NA. 9. Anticipated D/C setting: Home 10. Anticipated post D/C treatments: HH therapy and Home excercise program 11. Overall Rehab/Functional Prognosis: excellent and good  RECOMMENDATIONS: This patient's condition is appropriate for continued rehabilitative care in  the following setting: Patient states she has been to CIR before and believe she can manage at home witht he adaptive equipment and support she has.  Doing well with therapies as well.  Agree with both, recommend home with HH. Patient has agreed to participate in recommended program. Yes Note that insurance prior authorization may be required for reimbursement for recommended care.  Comment: Rehab Admissions Coordinator to follow up.   I have personally performed a face to face diagnostic evaluation, including, but not limited to relevant history and physical exam findings, of this patient and developed relevant assessment and plan.  Additionally, I have reviewed and concur with the physician assistant's documentation above.   Delice Lesch, MD, ABPMR Lavon Paganini Angiulli, PA-C 05/09/2018

## 2018-05-09 NOTE — Progress Notes (Signed)
Patient ID: [name], female   DOB: 03-06-1946, 72 y.o.   MRN: [id number] Vital signs are stable Motor function appears to be improved though patient still complains of left groin and left proximal lower extremity pain.  Incisions remain clean and dry.  She is mobilizing.  We will obtain AP and lateral radiograph of the lumbar spine.  Discussed issues of constipation.  We will add milk of magnesia.  May require suppository or an enema.

## 2018-05-09 NOTE — Progress Notes (Signed)
Physical Therapy Treatment Patient Details Name: [name] MRN: [id number] DOB: [birth year] Today's Date: 05/09/2018    History of Present Illness pt is a 72 y/o female with h/o DDD,  bil rot cuff sx, afib, admitted with significant back and bil LE pain for degenerative scoliosis.  Pt s/p Anterolateral decompression L1-5 with fusion using XLIF and bone grafting.  Further fixated posteriorly with pedicular screws from L1-S1.    PT Comments    Pt pleasant and very willing to mobilize. Pt educated for all back precautions, brace wear, transfers, gait and stairs. Pt able to don brace with flipping it over her head without physical assist. Pt with increased ability with gait and transfers today and remains safe for return home with assist. Encouraged ambulation throughout the day and limited bouts of sitting.    Follow Up Recommendations  Home health PT;Supervision/Assistance - 24 hour     Equipment Recommendations  3in1 (PT)    Recommendations for Other Services       Precautions / Restrictions Precautions Precautions: Back Required Braces or Orthoses: Spinal Brace Spinal Brace: Lumbar corset;Applied in sitting position    Mobility  Bed Mobility Overal bed mobility: Needs Assistance Bed Mobility: Sidelying to Sit;Sit to Sidelying   Sidelying to sit: Min assist     Sit to sidelying: Min assist General bed mobility comments: pt EOB with nursing on arrival. Practiced side<>sit end of session with HHA to elevate trunk from surface and assist to bring left leg onto surface to side  Transfers Overall transfer level: Needs assistance     Sit to Stand: Min guard         General transfer comment: cues for hand placement and safety from bed and BSC  Ambulation/Gait Ambulation/Gait assistance: Min guard Gait Distance (Feet): 300 Feet Assistive device: Rolling walker (2 wheeled) Gait Pattern/deviations: Step-through pattern;Decreased stride length   Gait velocity  interpretation: <1.8 ft/sec, indicate of risk for recurrent falls General Gait Details: short steps, cautious and slow with cues for posture and increased cadence   Stairs Stairs: Yes Stairs assistance: Min assist Stair Management: Step to pattern;Forwards;One rail Left Number of Stairs: 1 General stair comments: one step with rail and HHA with cues for sequence   Wheelchair Mobility    Modified Rankin (Stroke Patients Only)       Balance Overall balance assessment: Needs assistance   Sitting balance-Leahy Scale: Good       Standing balance-Leahy Scale: Poor                              Cognition Arousal/Alertness: Awake/alert Behavior During Therapy: WFL for tasks assessed/performed Overall Cognitive Status: Within Functional Limits for tasks assessed                                        Exercises      General Comments        Pertinent Vitals/Pain Pain Score: 6  Pain Location: left groin Pain Descriptors / Indicators: Guarding;Grimacing;Cramping Pain Intervention(s): Limited activity within patient's tolerance;Repositioned;Monitored during session;Premedicated before session    Home Living                      Prior Function            PT Goals (current goals can now be found in the care  plan section) Progress towards PT goals: Progressing toward goals    Frequency           PT Plan Current plan remains appropriate    Co-evaluation              AM-PAC PT "6 Clicks" Daily Activity  Outcome Measure  Difficulty turning over in bed (including adjusting bedclothes, sheets and blankets)?: A Little Difficulty moving from lying on back to sitting on the side of the bed? : Unable Difficulty sitting down on and standing up from a chair with arms (e.g., wheelchair, bedside commode, etc,.)?: A Little Help needed moving to and from a bed to chair (including a wheelchair)?: A Little Help needed walking in  hospital room?: A Little Help needed climbing 3-5 steps with a railing? : A Little 6 Click Score: 16    End of Session Equipment Utilized During Treatment: Back brace Activity Tolerance: Patient tolerated treatment well;Patient limited by pain Patient left: in chair;with call bell/phone within reach Nurse Communication: Mobility status PT Visit Diagnosis: Unsteadiness on feet (R26.81);Other abnormalities of gait and mobility (R26.89);Muscle weakness (generalized) (M62.81)     Time: 8185-9093 PT Time Calculation (min) (ACUTE ONLY): 34 min  Charges:  $Gait Training: 8-22 mins $Therapeutic Activity: 8-22 mins                     [place], PT Acute Rehabilitation Services Pager: [phone] Office: [place] 05/09/2018, 8:59 AM

## 2018-05-09 NOTE — Progress Notes (Signed)
Inpatient Rehabilitation-Admissions Coordinator   Followed up with pt at the bedside. Pt reports she is doing well and has a supportive spouse that can assist her at home. Pt plans to return home. AC will sign off and communicate pt requests to CM/SW.   Please call if questions.   Jhonnie Garner, OTR/L  Rehab Admissions Coordinator  [phone] 05/09/2018 11:45 AM

## 2018-05-10 ENCOUNTER — Encounter (HOSPITAL_COMMUNITY): Payer: Self-pay | Admitting: Neurological Surgery

## 2018-05-10 LAB — CBC
HEMATOCRIT: 31.2 % — AB (ref 36.0–46.0)
Hemoglobin: 9.6 g/dL — ABNORMAL LOW (ref 12.0–15.0)
MCH: 27.8 pg (ref 26.0–34.0)
MCHC: 30.8 g/dL (ref 30.0–36.0)
MCV: 90.4 fL (ref 80.0–100.0)
NRBC: 0.2 % (ref 0.0–0.2)
Platelets: 294 10*3/uL (ref 150–400)
RBC: 3.45 MIL/uL — ABNORMAL LOW (ref 3.87–5.11)
RDW: 14.5 % (ref 11.5–15.5)
WBC: 12.7 10*3/uL — ABNORMAL HIGH (ref 4.0–10.5)

## 2018-05-10 LAB — TYPE AND SCREEN
ABO/RH(D): AB POS
Antibody Screen: NEGATIVE
UNIT DIVISION: 0

## 2018-05-10 LAB — GLUCOSE, CAPILLARY
GLUCOSE-CAPILLARY: 122 mg/dL — AB (ref 70–99)
Glucose-Capillary: 144 mg/dL — ABNORMAL HIGH (ref 70–99)
Glucose-Capillary: 178 mg/dL — ABNORMAL HIGH (ref 70–99)
Glucose-Capillary: 128 mg/dL — ABNORMAL HIGH (ref 70–99)

## 2018-05-10 LAB — BASIC METABOLIC PANEL
Anion gap: 9 (ref 5–15)
BUN: 16 mg/dL (ref 8–23)
CALCIUM: 8.6 mg/dL — AB (ref 8.9–10.3)
CHLORIDE: 105 mmol/L (ref 98–111)
CO2: 26 mmol/L (ref 22–32)
Creatinine, Ser: 0.73 mg/dL (ref 0.44–1.00)
GFR calc Af Amer: >60 mL/min (ref >60)
GFR calc non Af Amer: >60 mL/min (ref >60)
GLUCOSE: 162 mg/dL — AB (ref 70–99)
Potassium: 4.2 mmol/L (ref 3.5–5.1)
Sodium: 140 mmol/L (ref 135–145)

## 2018-05-10 LAB — BPAM RBC
Blood Product Expiration Date: 201911092359
ISSUE DATE / TIME: 201910281108
Unit Type and Rh: 8400

## 2018-05-10 MED ORDER — HYDRALAZINE HCL 20 MG/ML IJ SOLN
10.0000 mg | Freq: Three times a day (TID) | INTRAMUSCULAR | Status: DC | PRN
  Administered 2018-05-10 – 2018-05-11 (×3): 10 mg via INTRAVENOUS
  Administered 2018-05-12: 5 mg via INTRAVENOUS
  Filled 2018-05-10 (×4): qty 1

## 2018-05-10 MED ORDER — HYDROCHLOROTHIAZIDE 25 MG PO TABS
25.0000 mg | ORAL_TABLET | Freq: Every day | ORAL | Status: DC
  Administered 2018-05-10 – 2018-05-11 (×2): 25 mg via ORAL
  Filled 2018-05-10 (×3): qty 1

## 2018-05-10 MED ORDER — OXYCODONE HCL 5 MG PO TABS
5.0000 mg | ORAL_TABLET | ORAL | Status: DC | PRN
  Administered 2018-05-10 – 2018-05-12 (×14): 5 mg via ORAL
  Filled 2018-05-10 (×14): qty 1

## 2018-05-10 MED ORDER — FLEET ENEMA 7-19 GM/118ML RE ENEM
1.0000 | ENEMA | Freq: Once | RECTAL | Status: AC
  Administered 2018-05-10: 1 via RECTAL
  Filled 2018-05-10: qty 1

## 2018-05-10 NOTE — Progress Notes (Signed)
05/09/18 1445  OT Visit Information  Assistance Needed +1  History of Present Illness pt is a 72 y/o female with h/o DDD,  bil rot cuff sx, afib, admitted with significant back and bil LE pain for degenerative scoliosis.  Pt s/p Anterolateral decompression L1-5 with fusion using XLIF and bone grafting.  Further fixated posteriorly with pedicular screws from L1-S1.  Precautions  Precautions Back  Required Braces or Orthoses Spinal Brace  Spinal Brace Lumbar corset;Applied in sitting position  Pain Assessment  Pain Location left groin  Pain Descriptors / Indicators Grimacing;Guarding  Cognition  Arousal/Alertness Awake/alert  Behavior During Therapy WFL for tasks assessed/performed  Overall Cognitive Status Within Functional Limits for tasks assessed  ADL  Overall ADL's  Needs assistance/impaired  Grooming Wash/dry hands;Oral care;Supervision/safety;Set up;Standing (used two cups to avoid bending over sink)  Lower Body Dressing Details (indicate cue type and reason) Pt reports he has all AE at home that she will use or have her husband assist her  Toilet Transfer Supervision/safety;Ambulation;Regular Toilet;BSC (over toilet)  Toileting - Clothing Manipulation Details (indicate cue type and reason) Pt can adjust her own clothing, but needs A for toileting hygiene--I showed her how a toilet aid works and gave her one to take home  Tub/Shower Transfer Details (indicate cue type and reason) pt has a walk in shower, built in seat, grab bar, and hand held shower--do not foresee any issue with her being able to do shower at a S level with maybe A for lower legs (she spoke of getting a long handled sponge)  Bed Mobility  Overal bed mobility Needs Assistance  Bed Mobility Rolling;Sidelying to Sit  Rolling Supervision (VCs for technque)  Sidelying to sit Supervision (VCs for technique)  Balance  Overall balance assessment Needs assistance  Sitting balance-Leahy Scale Good  Standing balance  support No upper extremity supported;During functional activity (standing at sink to wash hands and brush teeth)  Vision- Assessment  Vision Assessment? No apparent visual deficits  Transfers  Overall transfer level Needs assistance  Equipment used Rolling walker (2 wheeled)  Transfers Sit to/from Stand  Sit to Stand Supervision (VCs for technique)  OT - End of Session  Equipment Utilized During Treatment Gait belt;Back brace;Rolling walker  Activity Tolerance Patient tolerated treatment well  Patient left in chair;with call bell/phone within reach  OT Assessment/Plan  OT Plan  (All education completed)  OT Visit Diagnosis Unsteadiness on feet (R26.81);Muscle weakness (generalized) (M62.81);Pain  Pain - Right/Left Left  Pain - part of body  (groin)  OT Frequency (ACUTE ONLY) Min 3X/week  Follow Up Recommendations Home health OT;Supervision/Assistance - 24 hour  OT Equipment None recommended by OT (pt states she does not need 3n1 )  AM-PAC OT "6 Clicks" Daily Activity Outcome Measure  Help from another person eating meals? 4  Help from another person taking care of personal grooming? 3  Help from another person toileting, which includes using toliet, bedpan, or urinal? 2  Help from another person bathing (including washing, rinsing, drying)? 3  Help from another person to put on and taking off regular upper body clothing? 3  Help from another person to put on and taking off regular lower body clothing? 2  6 Click Score 17  ADL G Code Conversion CK  Acute Rehab OT Goals  Patient Stated Goal  (home tomorrow)  OT Time Calculation  OT Start Time (ACUTE ONLY) 1408  OT Stop Time (ACUTE ONLY) 1445  OT Time Calculation (min) 37 min  OT  General Charges  $OT Visit 1 Visit  OT Treatments  $Self Care/Home Management  23-37 mins   This 72 yo female seen for treatment session to finalize any questions/concerns she had concerning basic ADLs--all education completed and pt without any  further ADL concerns and has A from husband prn. We will D/C from acute OT.  Late entry for 05/09/2018 [place], OTR/L Acute Rehab Services Pager [phone] Office [phone]

## 2018-05-10 NOTE — Discharge Instructions (Signed)

## 2018-05-10 NOTE — Progress Notes (Signed)
PT Cancellation Note  Patient Details Name: [name] MRN: [id number] DOB: 10-Feb-1946   Cancelled Treatment:    Reason Eval/Treat Not Completed: Pain limiting ability to participate(pt just returned to bed and stated pain too high to participate at this time)   Story 05/10/2018, 8:00 AM  [place] Pager: [phone] Office: [phone]

## 2018-05-10 NOTE — Care Management Important Message (Signed)
Important Message  Patient Details  Name: [name] MRN: [id number] Date of Birth: 1946/03/28   Medicare Important Message Given:  Yes    [name] 05/10/2018, 1:39 PM

## 2018-05-10 NOTE — Progress Notes (Signed)
PROGRESS NOTE    [name]  ATF:[id number] DOB: [birth year] DOA: 05/06/2018 PCP: Jani Gravel, MD    Brief Narrative: [name] is a 72 y.o. female who had back pain and progressive scoliosis; therefore, admitted 10/25 for surgery.  Post op remained intubated so PCCM called for assistance. Patient post extubation. CCM tranfers her care to [facility] to help with medical problems.   Assessment & Plan:   Active Problems:   Respiratory failure (HCC)   Degenerative scoliosis   Scoliosis deformity of spine   Chronic atrial fibrillation   Chronic diastolic congestive heart failure (HCC)   Prediabetes   Post-operative pain   History of fusion of cervical spine   Acute blood loss anemia   Leukocytosis   1-Acute Respiratory failure;  Unable to be extubated in PACU, due to facial/neck edema after being prone for 8 hours for surgery.  S/P extubation 10-26.  Oxygen saturation on RA 98 %.   Hypotension; intra and post operative thought to be related to sedation.  Resolved.  Lactic acid normalized.   HTN;  BP elevated on Cardizem.  Will start HCTZ, renal function normal.  PRN hydralazine.   Constipation.  Will order fleet enema.   History of A fib Was on eliquis at home. Defer to neurosurgery timing to start anticoagulation.  Continue with Cardizem.   History of first degree block   Leukocytosis;  On IV steroids.  Afebrile.  Trending down.   Acute blood loss Anemia;  Post op.  B 12 low normal, started  supplement.  Will also start ferrous sulfate when constipation resolved.  S/P  one unit PRBC.  Hb stable today, increase to 9.   Lactic acidosis;  Resolved.   Hypomagnesemia;  Replace  Hyperglycemia;  In setting steroids.  Improving, steroids taper.    DVT prophylaxis: SCD Code Status:Full code.  Family Communication:  Disposition Plan: CIR recommended by PT   Consultants:   Neurosurgery is Primary.   CCM sign off 10-26   Procedures; events ETT  10/25 >> 10/26 10/25  Admit, anterolateral decompression at L1-L5 with posterior percutaneous pedicular screw fixation from L1-S1.   Antimicrobials:   none   Subjective: Denies dyspnea. Report back pain. Left LE with numbness.  No BM yet     Objective: Vitals:   05/09/18 2300 05/10/18 0300 05/10/18 0800 05/10/18 1152  BP: (!) 172/76 (!) 145/68 (!) 182/72 (!) 171/67  Pulse: 63  63 63  Resp: 13 13 18 14   Temp: 97.9 F (36.6 C) 98.2 F (36.8 C) (!) 97.4 F (36.3 C) 98.4 F (36.9 C)  TempSrc: Oral Oral Oral   SpO2: 99%  98% 100%  Weight:      Height:        Intake/Output Summary (Last 24 hours) at 05/10/2018 1305 Last data filed at 05/10/2018 1200 Gross per 24 hour  Intake 480 ml  Output -  Net 480 ml   Filed Weights   05/06/18 0542 05/06/18 2030  Weight: 98.4 kg 117.6 kg    Examination:  General exam: NAD Respiratory system; CTA Cardiovascular system: S 1, S 2 RRR Gastrointestinal system: BS present, soft, nt Central nervous system: alert and oriented Extremities: no edema Skin: no rashes.     Data Reviewed: I have personally reviewed following labs and imaging studies  CBC: Recent Labs  Lab 05/06/18 2247 05/07/18 1004 05/08/18 0823 05/09/18 0555 05/09/18 1839 05/10/18 0740  WBC 19.3* 16.1* 14.7* 12.4*  --  12.7*  HGB 9.7* 8.6*  7.9* 7.6* 9.2* 9.6*  HCT 31.2* 27.8* 25.7* 24.7* 29.6* 31.2*  MCV 90.7 90.8 92.1 91.8  --  90.4  PLT 274 269 252 251  --  127   Basic Metabolic Panel: Recent Labs  Lab 05/06/18 2247 05/07/18 1004 05/09/18 0555 05/10/18 0740  NA 137 139 140 140  K 4.0 4.2 4.3 4.2  CL 106 105 112* 105  CO2 20* 22 23 26   GLUCOSE 290* 210* 166* 162*  BUN 15 17 18 16   CREATININE 0.89 0.89 0.78 0.73  CALCIUM 8.2* 8.5* 8.4* 8.6*  MG 1.6* 1.7  --   --   PHOS 4.0 2.6  --   --    GFR: Estimated Creatinine Clearance: 80.3 mL/min (by C-G formula based on SCr of 0.73 mg/dL). Liver Function Tests: Recent Labs  Lab 05/06/18 2247    AST 71*  ALT 54*  ALKPHOS 79  BILITOT 1.0  PROT 5.6*  ALBUMIN 3.6   No results for input(s): LIPASE, AMYLASE in the last 168 hours. No results for input(s): AMMONIA in the last 168 hours. Coagulation Profile: No results for input(s): INR, PROTIME in the last 168 hours. Cardiac Enzymes: Recent Labs  Lab 05/06/18 2247  CKTOTAL 827*  CKMB 17.5*   BNP (last 3 results) No results for input(s): PROBNP in the last 8760 hours. HbA1C: No results for input(s): HGBA1C in the last 72 hours. CBG: Recent Labs  Lab 05/09/18 1140 05/09/18 1553 05/09/18 2200 05/10/18 0816 05/10/18 1217  GLUCAP 131* 165* 135* 144* 178*   Lipid Profile: No results for input(s): CHOL, HDL, LDLCALC, TRIG, CHOLHDL, LDLDIRECT in the last 72 hours. Thyroid Function Tests: No results for input(s): TSH, T4TOTAL, FREET4, T3FREE, THYROIDAB in the last 72 hours. Anemia Panel: Recent Labs    05/08/18 0823  VITAMINB12 271  FOLATE 5.9*  FERRITIN 240  TIBC 256  IRON 47  RETICCTPCT 1.6   Sepsis Labs: Recent Labs  Lab 05/06/18 2247 05/07/18 1004 05/08/18 0823  LATICACIDVEN 4.8* 3.8* 1.8    No results found for this or any previous visit (from the past 240 hour(s)).       Radiology Studies: Dg Lumbar Spine 2-3 Views  Result Date: 05/10/2018 CLINICAL DATA:  Lumbago. Status post multifocal screw and plate fixation EXAM: LUMBAR SPINE - 2-3 VIEW COMPARISON:  Intraoperative images May 06, 2018; prior lumbar CT October 25, 2017 FINDINGS: Frontal, lateral, and spot lumbosacral lateral images were obtained. There is posterior screw and plate fixation from N1-Z0 with the screw and plate fixation device is intact. There are disk spacers at L1-2, L2-3, L3-4, and L4-5. There is no appreciable fracture or spondylolisthesis. Pedicle screw tips are in the respective vertebral bodies bilaterally. There is arthropathy at all levels. There are multiple anterior osteophytes at all levels. No erosive changes.  IMPRESSION: Extensive postoperative change with support hardware intact. Multilevel arthropathy. No fracture or spondylolisthesis. Electronically Signed   By: Lowella Grip III M.D.   On: 05/10/2018 07:09        Scheduled Meds: . apixaban  5 mg Oral BID  . dexamethasone  4 mg Intravenous Q24H  . diltiazem  120 mg Oral Daily  . docusate sodium  100 mg Oral BID  . montelukast  10 mg Oral QHS  . polyethylene glycol  17 g Oral BID  . senna  1 tablet Oral BID  . sodium chloride flush  3 mL Intravenous Q12H  . vitamin B-12  100 mcg Oral Daily   Continuous Infusions: .  sodium chloride    . sodium chloride Stopped (05/09/18 0807)  . methocarbamol (ROBAXIN) IV Stopped (05/07/18 2117)     LOS: 4 days    Time spent: 35 minutes.     Elmarie Shiley, MD [facility] Pager [phone]  If 7PM-7AM, please contact night-coverage [email or web address] Password [email or web address] 05/10/2018, 1:05 PM

## 2018-05-10 NOTE — Progress Notes (Signed)
Physical Therapy Treatment Patient Details Name: [name] MRN: [id number] DOB: [birth year] Today's Date: 05/10/2018    History of Present Illness pt is a 72 y/o female with h/o DDD,  bil rot cuff sx, afib, admitted with significant back and bil LE pain for degenerative scoliosis.  Pt s/p Anterolateral decompression L1-5 with fusion using XLIF and bone grafting.  Further fixated posteriorly with pedicular screws from L1-S1.    PT Comments    Pt pleasant and reports pain better after medication this morning. Pt reports she if feeling more mobile and eager to return home. Pt educated for all back precautions, able to don brace with supervision EOB and continues to struggle with bed mobility requiring physical assist. Increased posture and speed with gait today and encouraged to continue gait trials.    Follow Up Recommendations  No PT follow up;Supervision/Assistance - 24 hour     Equipment Recommendations  3in1 (PT)    Recommendations for Other Services       Precautions / Restrictions Precautions Precautions: Back Required Braces or Orthoses: Spinal Brace Spinal Brace: Lumbar corset;Applied in sitting position Restrictions Weight Bearing Restrictions: No    Mobility  Bed Mobility Overal bed mobility: Needs Assistance Bed Mobility: Rolling;Sidelying to Sit Rolling: Min assist Sidelying to sit: Min assist       General bed mobility comments: cues for sequence with assist to roll without use of rail, cues for sequence and assist to rise from surface  Transfers Overall transfer level: Needs assistance   Transfers: Sit to/from Stand Sit to Stand: Supervision         General transfer comment: cues for hand placement and safety from bed  Ambulation/Gait Ambulation/Gait assistance: Supervision Gait Distance (Feet): 200 Feet Assistive device: Rolling walker (2 wheeled) Gait Pattern/deviations: Step-through pattern;Decreased stride length   Gait velocity  interpretation: >2.62 ft/sec, indicative of community ambulatory General Gait Details: cues for increased stride and speed, shoulder depression and upright posture   Stairs             Wheelchair Mobility    Modified Rankin (Stroke Patients Only)       Balance Overall balance assessment: Needs assistance   Sitting balance-Leahy Scale: Good       Standing balance-Leahy Scale: Fair                              Cognition Arousal/Alertness: Awake/alert Behavior During Therapy: WFL for tasks assessed/performed Overall Cognitive Status: Within Functional Limits for tasks assessed                                        Exercises      General Comments        Pertinent Vitals/Pain Pain Score: 5  Pain Location: left groin Pain Descriptors / Indicators: Aching;Guarding Pain Intervention(s): Limited activity within patient's tolerance;Repositioned;Premedicated before session;Monitored during session    Home Living                      Prior Function            PT Goals (current goals can now be found in the care plan section) Progress towards PT goals: Progressing toward goals    Frequency    Min 5X/week      PT Plan Discharge plan needs to be updated  Co-evaluation              AM-PAC PT "6 Clicks" Daily Activity  Outcome Measure  Difficulty turning over in bed (including adjusting bedclothes, sheets and blankets)?: Unable Difficulty moving from lying on back to sitting on the side of the bed? : Unable Difficulty sitting down on and standing up from a chair with arms (e.g., wheelchair, bedside commode, etc,.)?: A Little Help needed moving to and from a bed to chair (including a wheelchair)?: A Little Help needed walking in hospital room?: A Little Help needed climbing 3-5 steps with a railing? : A Little 6 Click Score: 14    End of Session Equipment Utilized During Treatment: Back brace Activity  Tolerance: Patient tolerated treatment well;Patient limited by pain Patient left: in chair;with call bell/phone within reach Nurse Communication: Mobility status PT Visit Diagnosis: Unsteadiness on feet (R26.81);Other abnormalities of gait and mobility (R26.89);Muscle weakness (generalized) (M62.81)     Time: 4707-6151 PT Time Calculation (min) (ACUTE ONLY): 21 min  Charges:  $Gait Training: 8-22 mins                     [place], PT Acute Rehabilitation Services Pager: [phone] Office: [place] 05/10/2018, 10:44 AM

## 2018-05-10 NOTE — Progress Notes (Signed)
Patient ID: [name], female   DOB: [birth year], 72 y.o.   MRN: [id number] Vital signs are stable Patient having minimal success with bowel movement Had recent enema Having pain and swelling in calves of both legs We will check Doppler study Eliquis restarted

## 2018-05-11 ENCOUNTER — Inpatient Hospital Stay (HOSPITAL_COMMUNITY): Payer: PPO

## 2018-05-11 DIAGNOSIS — R52 Pain, unspecified: Secondary | ICD-10-CM

## 2018-05-11 DIAGNOSIS — I10 Essential (primary) hypertension: Secondary | ICD-10-CM

## 2018-05-11 LAB — BASIC METABOLIC PANEL
Anion gap: 8 (ref 5–15)
BUN: 15 mg/dL (ref 8–23)
CALCIUM: 8.6 mg/dL — AB (ref 8.9–10.3)
CO2: 25 mmol/L (ref 22–32)
Chloride: 104 mmol/L (ref 98–111)
Creatinine, Ser: 0.68 mg/dL (ref 0.44–1.00)
GFR calc non Af Amer: >60 mL/min (ref >60)
Glucose, Bld: 178 mg/dL — ABNORMAL HIGH (ref 70–99)
Potassium: 4.1 mmol/L (ref 3.5–5.1)
Sodium: 137 mmol/L (ref 135–145)

## 2018-05-11 LAB — GLUCOSE, CAPILLARY
GLUCOSE-CAPILLARY: 154 mg/dL — AB (ref 70–99)
GLUCOSE-CAPILLARY: 135 mg/dL — AB (ref 70–99)
Glucose-Capillary: 115 mg/dL — ABNORMAL HIGH (ref 70–99)
Glucose-Capillary: 161 mg/dL — ABNORMAL HIGH (ref 70–99)

## 2018-05-11 MED ORDER — VITAMIN B-12 100 MCG PO TABS
500.0000 ug | ORAL_TABLET | Freq: Every day | ORAL | Status: DC
  Administered 2018-05-12: 500 ug via ORAL
  Filled 2018-05-11: qty 5

## 2018-05-11 MED ORDER — FOLIC ACID 1 MG PO TABS
1.0000 mg | ORAL_TABLET | Freq: Every day | ORAL | Status: DC
  Administered 2018-05-11 – 2018-05-12 (×2): 1 mg via ORAL
  Filled 2018-05-11 (×2): qty 1

## 2018-05-11 MED ORDER — FLEET ENEMA 7-19 GM/118ML RE ENEM: 1.0000 | ENEMA | Freq: Every day | RECTAL | Status: DC | PRN

## 2018-05-11 NOTE — Progress Notes (Signed)
Physical Therapy Treatment Patient Details Name: [name] MRN: [id number] DOB: Jun 27, 1946 Today's Date: 05/11/2018    History of Present Illness pt is a 72 y/o female with h/o DDD,  bil rot cuff sx, afib, admitted with significant back and bil LE pain for degenerative scoliosis.  Pt s/p Anterolateral decompression L1-5 with fusion using XLIF and bone grafting.  Further fixated posteriorly with pedicular screws from L1-S1.    PT Comments    Pt with functional decline due to L hip/groin/LE pain requiring increased assist for transfers and demo'd decreased ambulation tolerance. Pt also reports spouse will be unable to assist her now due to knee injury and requiring TKA. Currently pt unable to return home without physical assist and would benefit from CIR upon d/c to achieve safe mod I level of function.    Follow Up Recommendations  CIR     Equipment Recommendations  None recommended by PT    Recommendations for Other Services Rehab consult     Precautions / Restrictions Precautions Precautions: Back Precaution Booklet Issued: Yes (comment) Precaution Comments: pt with no recall and constantly requiring v/c's to adhere during function Required Braces or Orthoses: Spinal Brace Spinal Brace: Lumbar corset;Applied in sitting position Restrictions Weight Bearing Restrictions: No    Mobility  Bed Mobility Overal bed mobility: Needs Assistance Bed Mobility: Rolling;Sidelying to Sit Rolling: Min assist Sidelying to sit: Min assist     Sit to sidelying: Mod assist General bed mobility comments: increased time, minA for L LE management out of bed, modA for bilat LE managment back into bed  Transfers Overall transfer level: Needs assistance Equipment used: Rolling walker (2 wheeled) Transfers: Sit to/from Stand Sit to Stand: Min assist         General transfer comment: v/c's to push up from bed, minA to push down on walker as pt was trying to pull up despite max verbal  cues, increased time  Ambulation/Gait Ambulation/Gait assistance: Min guard Gait Distance (Feet): 100 Feet Assistive device: Rolling walker (2 wheeled) Gait Pattern/deviations: Step-through pattern;Decreased stride length Gait velocity: slow Gait velocity interpretation: <1.31 ft/sec, indicative of household ambulator General Gait Details: pt very slow and guarded due to L LE pain, pt howeve with no antalgia   Stairs             Wheelchair Mobility    Modified Rankin (Stroke Patients Only)       Balance Overall balance assessment: Needs assistance Sitting-balance support: No upper extremity supported;Feet supported Sitting balance-Leahy Scale: Good     Standing balance support: Bilateral upper extremity supported Standing balance-Leahy Scale: Fair Standing balance comment: reliance on the RW and external support                            Cognition Arousal/Alertness: Awake/alert Behavior During Therapy: Anxious Overall Cognitive Status: Within Functional Limits for tasks assessed                                 General Comments: pt very anxious regarding pain and not being able to go home because her husband cant care for her because he needs knee surgery      Exercises      General Comments        Pertinent Vitals/Pain Pain Assessment: 0-10 Pain Score: 9  Pain Location: left groin Pain Descriptors / Indicators: Aching;Guarding Pain Intervention(s): Limited activity within  patient's tolerance    Home Living                      Prior Function            PT Goals (current goals can now be found in the care plan section) Progress towards PT goals: Progressing toward goals    Frequency    Min 5X/week      PT Plan Discharge plan needs to be updated    Co-evaluation              AM-PAC PT "6 Clicks" Daily Activity  Outcome Measure  Difficulty turning over in bed (including adjusting bedclothes,  sheets and blankets)?: Unable Difficulty moving from lying on back to sitting on the side of the bed? : Unable Difficulty sitting down on and standing up from a chair with arms (e.g., wheelchair, bedside commode, etc,.)?: Unable Help needed moving to and from a bed to chair (including a wheelchair)?: A Little Help needed walking in hospital room?: A Little Help needed climbing 3-5 steps with a railing? : A Lot 6 Click Score: 11    End of Session Equipment Utilized During Treatment: Back brace Activity Tolerance: Patient tolerated treatment well;Patient limited by pain Patient left: with call bell/phone within reach;in bed Nurse Communication: Mobility status PT Visit Diagnosis: Unsteadiness on feet (R26.81);Other abnormalities of gait and mobility (R26.89);Muscle weakness (generalized) (M62.81) Pain - Right/Left: Left     Time: 1209-1227 PT Time Calculation (min) (ACUTE ONLY): 18 min  Charges:  $Gait Training: 8-22 mins                     [name], PT, DPT Acute Rehabilitation Services Pager #: [phone] Office #: [phone]    [name] 05/11/2018, 2:31 PM

## 2018-05-11 NOTE — Progress Notes (Signed)
Inpatient Rehabilitation-Admissions Coordinator   Noted new CIR consult placed by neurosurgery. Pt has had a decline in function today with increased pain in left groin area. Pt was performing at supervision level yesterday with plans for home and husband to assist as needed. When [facility] met with pt today, she expressed concerns over her decline in function as well as new lack of caregiver support at home (per pt, her husband is having acute knee issues that may require surgery in the near future).   Pt wanting AC to check her insurance benefits and open insurance authorization for possible admit. AC will follow up with pt tomorrow and follow for status with therapy to see if CIR remains appropriate .    Please call if questions  Jhonnie Garner, OTR/L  Rehab Admissions Coordinator  [phone] 05/11/2018 4:53 PM

## 2018-05-11 NOTE — Progress Notes (Signed)
PROGRESS NOTE    [name]  NGE:[id number] DOB: November 07, 1945 DOA: 05/06/2018 PCP: Emily Gravel, MD    Brief Narrative: [name] is a 72 y.o. female who had back pain and progressive scoliosis; therefore, admitted 10/25 for surgery.  Post op remained intubated so PCCM called for assistance. Patient post extubation. CCM tranfers her care to [facility] to help with medical problems.   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  Neurosurgery is following.  Patient remains on steroids.  Acute Respiratory failure with hypoxia Unable to be extubated in PACU, due to facial/neck edema after being prone for 8 hours for surgery. S/P extubation 10-26.  Seems to be stable from a respiratory standpoint.    Hypotension This was in the perioperative period.  Now resolved.  Essential hypertension  Blood pressure remains poorly controlled.  Patient noted to be on Cardizem and hydrochlorothiazide.  Looks like this was reinitiated recently.  Continue to monitor for now.  May need dose adjustments of blood pressure remains poorly controlled over the next 24 hours.    Lower extremity pain Most likely secondary to surgery and spine issues.  Doppler study ordered by neurosurgery.  Constipation Continue bowel regimen.  Minimal response with enema yesterday.  History of A fib Stable.  Continue Cardizem.  Patient was on Eliquis at home.  Held for surgery.  This has been resumed by neurosurgery.     History of first degree block  Stable  Leukocytosis Most likely due to IV steroids.   Continue to monitor.  She is afebrile.    Acute blood loss Anemia This was in the setting of surgery.  She was transfused 1 unit of PRBC.  Anemia panel showed a ferritin of 240.  Vitamin B12 271.  Folic acid 5.9.  Started on B12 supplementation.  She will also need folic acid mentation.    Lactic acidosis Resolved.   Hypomagnesemia This was repleted.  Hyperglycemia This was in the setting of steroids.  Now improved.     DVT prophylaxis: Started back on apixaban Code Status: Full code Disposition Plan: Management as outlined above.  CIR recommended by physical therapy.  Consultants:   Neurosurgery is Primary.   CCM sign off 10-26   Procedures; events ETT 10/25 >> 10/26 10/25  Anterolateral decompression at L1-L5 with posterior percutaneous pedicular screw fixation from L1-S1.  Antimicrobials:   none   Subjective: Patient concerned about pain in her left lower extremity.  Denies any shortness of breath or chest pain.    Objective: Vitals:   05/10/18 1900 05/10/18 2358 05/11/18 0300 05/11/18 0735  BP: (!) 152/63 (!) 158/66 (!) 183/72 (!) 142/70  Pulse:  70  64  Resp: 18 13  10   Temp: 98.2 F (36.8 C) 97.6 F (36.4 C) 97.9 F (36.6 C) 97.7 F (36.5 C)  TempSrc: Oral Oral Oral Oral  SpO2: 99%   100%  Weight:      Height:        Intake/Output Summary (Last 24 hours) at 05/11/2018 1037 Last data filed at 05/11/2018 0800 Gross per 24  hour  Intake 600 ml  Output -  Net 600 ml   Filed Weights   05/06/18 0542 05/06/18 2030  Weight: 98.4 kg 117.6 kg    Examination:  Awake alert.  In no distress Lungs are clear to auscultation bilaterally.  No wheezing rales or rhonchi S1-S2 is normal regular.  No S3-S4.  No rubs murmurs or bruit Abdomen is soft.  Nontender nondistended.  No masses or organomegaly.  Bowel sounds present and normal. No calf tenderness.  Leg sizes appear to be equal.  Mild edema noted. She is awake alert.  Oriented x3.  No focal  neurological deficits.    Data Reviewed: I have personally reviewed following labs and imaging studies  CBC: Recent Labs  Lab 05/06/18 2247 05/07/18 1004 05/08/18 0823 05/09/18 0555 05/09/18 1839 05/10/18 0740  WBC 19.3* 16.1* 14.7* 12.4*  --  12.7*  HGB 9.7* 8.6* 7.9* 7.6* 9.2* 9.6*  HCT 31.2* 27.8* 25.7* 24.7* 29.6* 31.2*  MCV 90.7 90.8 92.1 91.8  --  90.4  PLT 274 269 252 251  --  846   Basic Metabolic Panel: Recent Labs  Lab 05/06/18 2247 05/07/18 1004 05/09/18 0555 05/10/18 0740 05/11/18 0410  NA 137 139 140 140 137  K 4.0 4.2 4.3 4.2 4.1  CL 106 105 112* 105 104  CO2 20* 22 23 26 25   GLUCOSE 290* 210* 166* 162* 178*  BUN 15 17 18 16 15   CREATININE 0.89 0.89 0.78 0.73 0.68  CALCIUM 8.2* 8.5* 8.4* 8.6* 8.6*  MG 1.6* 1.7  --   --   --   PHOS 4.0 2.6  --   --   --    GFR: Estimated Creatinine Clearance: 80.3 mL/min (by C-G formula based on SCr of 0.68 mg/dL).  Liver Function Tests: Recent Labs  Lab 05/06/18 2247  AST 71*  ALT 54*  ALKPHOS 79  BILITOT 1.0  PROT 5.6*  ALBUMIN 3.6   Cardiac Enzymes: Recent Labs  Lab 05/06/18 2247  CKTOTAL 827*  CKMB 17.5*   CBG: Recent Labs  Lab 05/10/18 0816 05/10/18 1217 05/10/18 1709 05/10/18 2148 05/11/18 0759  GLUCAP 144* 178* 128* 122* 154*  Sepsis Labs: Recent Labs  Lab 05/06/18 2247 05/07/18 1004 05/08/18 0823  LATICACIDVEN 4.8* 3.8* 1.8     Radiology Studies: Dg Lumbar Spine 2-3 Views  Result Date: 05/10/2018 CLINICAL DATA:  Lumbago. Status post multifocal screw and plate fixation EXAM: LUMBAR SPINE - 2-3 VIEW COMPARISON:  Intraoperative images May 06, 2018; prior lumbar CT October 25, 2017 FINDINGS: Frontal, lateral, and spot lumbosacral lateral images were obtained. There is posterior screw and plate fixation from N6-E9 with the screw and plate fixation device is intact. There are disk spacers at L1-2, L2-3, L3-4, and L4-5. There is no appreciable fracture or spondylolisthesis. Pedicle  screw tips are in the respective vertebral bodies bilaterally. There is arthropathy at all levels. There are multiple anterior osteophytes at all levels. No erosive changes. IMPRESSION: Extensive postoperative change with support hardware intact. Multilevel arthropathy. No fracture or spondylolisthesis. Electronically Signed   By: Lowella Grip III M.D.   On: 05/10/2018 07:09      Scheduled Meds: . apixaban  5 mg Oral BID  . dexamethasone  4 mg Intravenous Q24H  . diltiazem  120 mg Oral Daily  . docusate sodium  100 mg Oral BID  . hydrochlorothiazide  25 mg Oral Daily  . montelukast  10 mg Oral QHS  . polyethylene glycol  17 g Oral  BID  . senna  1 tablet Oral BID  . sodium chloride flush  3 mL Intravenous Q12H  . vitamin B-12  100 mcg Oral Daily   Continuous Infusions: . sodium chloride    . sodium chloride Stopped (05/09/18 0807)  . methocarbamol (ROBAXIN) IV Stopped (05/07/18 2117)     LOS: 5 days     Bonnielee Haff, MD [facility] Pager [phone]  If 7PM-7AM, please contact night-coverage [email or web address] Password [email or web address] 05/11/2018, 10:37 AM

## 2018-05-11 NOTE — Progress Notes (Signed)
Patient ID: [name], female   DOB: 1946-06-01, 72 y.o.   MRN: [id number] Vital signs are stable blood pressure 150/69 this morning Still having left groin pain left lower extremity pains dominant problem Bowels have moved We will obtain CT scan of the lumbar spine We will ask rehabilitation medicine to evaluate regarding consideration for CIR

## 2018-05-11 NOTE — Progress Notes (Signed)
Bilateral lower extremity venous duplex has been completed. Negative for obvious evidence of DVT.  05/11/18 11:39 AM Carlos Levering RVT

## 2018-05-12 ENCOUNTER — Inpatient Hospital Stay (HOSPITAL_COMMUNITY)
Admission: RE | Admit: 2018-05-12 | Discharge: 2018-05-20 | DRG: 560 | Disposition: A | Discharge status: Home / Self Care | Payer: PPO | Source: Intra-hospital | Attending: Physical Medicine & Rehabilitation | Admitting: Physical Medicine & Rehabilitation

## 2018-05-12 ENCOUNTER — Encounter (HOSPITAL_COMMUNITY): Payer: Self-pay | Admitting: *Deleted

## 2018-05-12 ENCOUNTER — Other Ambulatory Visit: Payer: Self-pay

## 2018-05-12 DIAGNOSIS — I110 Hypertensive heart disease with heart failure: Secondary | ICD-10-CM | POA: Diagnosis not present

## 2018-05-12 DIAGNOSIS — N390 Urinary tract infection, site not specified: Secondary | ICD-10-CM | POA: Diagnosis not present

## 2018-05-12 DIAGNOSIS — A499 Bacterial infection, unspecified: Secondary | ICD-10-CM | POA: Diagnosis not present

## 2018-05-12 DIAGNOSIS — Z96653 Presence of artificial knee joint, bilateral: Secondary | ICD-10-CM | POA: Diagnosis not present

## 2018-05-12 DIAGNOSIS — Z79899 Other long term (current) drug therapy: Secondary | ICD-10-CM

## 2018-05-12 DIAGNOSIS — K5900 Constipation, unspecified: Secondary | ICD-10-CM | POA: Diagnosis not present

## 2018-05-12 DIAGNOSIS — D62 Acute posthemorrhagic anemia: Secondary | ICD-10-CM | POA: Diagnosis present

## 2018-05-12 DIAGNOSIS — G8220 Paraplegia, unspecified: Secondary | ICD-10-CM | POA: Diagnosis not present

## 2018-05-12 DIAGNOSIS — I482 Chronic atrial fibrillation, unspecified: Secondary | ICD-10-CM

## 2018-05-12 DIAGNOSIS — Z7901 Long term (current) use of anticoagulants: Secondary | ICD-10-CM

## 2018-05-12 DIAGNOSIS — R269 Unspecified abnormalities of gait and mobility: Secondary | ICD-10-CM

## 2018-05-12 DIAGNOSIS — B9620 Unspecified Escherichia coli [E. coli] as the cause of diseases classified elsewhere: Secondary | ICD-10-CM | POA: Diagnosis not present

## 2018-05-12 DIAGNOSIS — Z4789 Encounter for other orthopedic aftercare: Principal | ICD-10-CM

## 2018-05-12 DIAGNOSIS — I480 Paroxysmal atrial fibrillation: Secondary | ICD-10-CM

## 2018-05-12 DIAGNOSIS — M5416 Radiculopathy, lumbar region: Secondary | ICD-10-CM | POA: Diagnosis not present

## 2018-05-12 DIAGNOSIS — I5032 Chronic diastolic (congestive) heart failure: Secondary | ICD-10-CM | POA: Diagnosis present

## 2018-05-12 DIAGNOSIS — I4819 Other persistent atrial fibrillation: Secondary | ICD-10-CM | POA: Diagnosis not present

## 2018-05-12 LAB — CBC
HEMATOCRIT: 32.8 % — AB (ref 36.0–46.0)
HEMOGLOBIN: 10.5 g/dL — AB (ref 12.0–15.0)
MCH: 28.4 pg (ref 26.0–34.0)
MCHC: 32 g/dL (ref 30.0–36.0)
MCV: 88.6 fL (ref 80.0–100.0)
Platelets: 351 10*3/uL (ref 150–400)
RBC: 3.7 MIL/uL — AB (ref 3.87–5.11)
RDW: 14.4 % (ref 11.5–15.5)
WBC: 12.3 10*3/uL — ABNORMAL HIGH (ref 4.0–10.5)
nRBC: 0 % (ref 0.0–0.2)

## 2018-05-12 LAB — MAGNESIUM: Magnesium: 1.9 mg/dL (ref 1.7–2.4)

## 2018-05-12 LAB — BASIC METABOLIC PANEL
Anion gap: 8 (ref 5–15)
BUN: 17 mg/dL (ref 8–23)
CO2: 28 mmol/L (ref 22–32)
Calcium: 8.7 mg/dL — ABNORMAL LOW (ref 8.9–10.3)
Chloride: 101 mmol/L (ref 98–111)
Creatinine, Ser: 0.69 mg/dL (ref 0.44–1.00)
GFR calc non Af Amer: >60 mL/min (ref >60)
Glucose, Bld: 178 mg/dL — ABNORMAL HIGH (ref 70–99)
Potassium: 3.7 mmol/L (ref 3.5–5.1)
Sodium: 137 mmol/L (ref 135–145)

## 2018-05-12 LAB — GLUCOSE, CAPILLARY
Glucose-Capillary: 169 mg/dL — ABNORMAL HIGH (ref 70–99)
Glucose-Capillary: 201 mg/dL — ABNORMAL HIGH (ref 70–99)
Glucose-Capillary: 139 mg/dL — ABNORMAL HIGH (ref 70–99)
Glucose-Capillary: 150 mg/dL — ABNORMAL HIGH (ref 70–99)

## 2018-05-12 MED ORDER — ONDANSETRON HCL 4 MG/2ML IJ SOLN: 4.0000 mg | Freq: Four times a day (QID) | INTRAMUSCULAR | Status: DC | PRN

## 2018-05-12 MED ORDER — ACETAMINOPHEN 325 MG PO TABS
650.0000 mg | ORAL_TABLET | ORAL | Status: DC | PRN
  Administered 2018-05-12 – 2018-05-17 (×5): 650 mg via ORAL
  Filled 2018-05-12 (×5): qty 2

## 2018-05-12 MED ORDER — VITAMIN B-12 1000 MCG PO TABS
500.0000 ug | ORAL_TABLET | Freq: Every day | ORAL | Status: DC
  Administered 2018-05-13 – 2018-05-20 (×8): 500 ug via ORAL
  Filled 2018-05-12 (×8): qty 1

## 2018-05-12 MED ORDER — DILTIAZEM HCL ER COATED BEADS 180 MG PO CP24
180.0000 mg | ORAL_CAPSULE | Freq: Every day | ORAL | Status: DC
  Administered 2018-05-13 – 2018-05-20 (×8): 180 mg via ORAL
  Filled 2018-05-12 (×8): qty 1

## 2018-05-12 MED ORDER — ACETAMINOPHEN 650 MG RE SUPP: 650.0000 mg | RECTAL | Status: DC | PRN

## 2018-05-12 MED ORDER — GABAPENTIN 100 MG PO CAPS: 200.0000 mg | ORAL_CAPSULE | Freq: Every day | ORAL | Status: DC

## 2018-05-12 MED ORDER — BISACODYL 10 MG RE SUPP: 10.0000 mg | Freq: Every day | RECTAL | Status: DC | PRN

## 2018-05-12 MED ORDER — ONDANSETRON HCL 4 MG PO TABS: 4.0000 mg | ORAL_TABLET | Freq: Four times a day (QID) | ORAL | Status: DC | PRN

## 2018-05-12 MED ORDER — METHOCARBAMOL 1000 MG/10ML IJ SOLN
500.0000 mg | Freq: Four times a day (QID) | INTRAVENOUS | Status: DC | PRN
  Filled 2018-05-12: qty 5

## 2018-05-12 MED ORDER — POLYETHYLENE GLYCOL 3350 17 G PO PACK
17.0000 g | PACK | Freq: Two times a day (BID) | ORAL | Status: DC
  Administered 2018-05-12 – 2018-05-19 (×10): 17 g via ORAL
  Filled 2018-05-12 (×14): qty 1

## 2018-05-12 MED ORDER — APIXABAN 5 MG PO TABS
5.0000 mg | ORAL_TABLET | Freq: Two times a day (BID) | ORAL | Status: DC
  Administered 2018-05-12 – 2018-05-20 (×16): 5 mg via ORAL
  Filled 2018-05-12 (×17): qty 1

## 2018-05-12 MED ORDER — METHOCARBAMOL 500 MG PO TABS
500.0000 mg | ORAL_TABLET | Freq: Four times a day (QID) | ORAL | Status: DC | PRN
  Administered 2018-05-12 – 2018-05-20 (×11): 500 mg via ORAL
  Filled 2018-05-12 (×12): qty 1

## 2018-05-12 MED ORDER — OXYCODONE HCL 5 MG PO TABS
5.0000 mg | ORAL_TABLET | ORAL | Status: DC | PRN
  Administered 2018-05-12 – 2018-05-13 (×5): 5 mg via ORAL
  Filled 2018-05-12 (×5): qty 1

## 2018-05-12 MED ORDER — DILTIAZEM HCL ER COATED BEADS 180 MG PO CP24
180.0000 mg | ORAL_CAPSULE | Freq: Every day | ORAL | Status: DC
  Administered 2018-05-12: 180 mg via ORAL
  Filled 2018-05-12: qty 1

## 2018-05-12 MED ORDER — SENNA 8.6 MG PO TABS
1.0000 | ORAL_TABLET | Freq: Two times a day (BID) | ORAL | Status: DC
  Administered 2018-05-12 – 2018-05-19 (×10): 8.6 mg via ORAL
  Filled 2018-05-12 (×13): qty 1

## 2018-05-12 MED ORDER — HYDROCHLOROTHIAZIDE 25 MG PO TABS
25.0000 mg | ORAL_TABLET | Freq: Every day | ORAL | Status: DC
  Administered 2018-05-13 – 2018-05-20 (×8): 25 mg via ORAL
  Filled 2018-05-12 (×8): qty 1

## 2018-05-12 MED ORDER — GABAPENTIN 100 MG PO CAPS
200.0000 mg | ORAL_CAPSULE | Freq: Every day | ORAL | Status: DC
  Administered 2018-05-12: 200 mg via ORAL
  Filled 2018-05-12 (×2): qty 2

## 2018-05-12 MED ORDER — DEXAMETHASONE 2 MG PO TABS
2.0000 mg | ORAL_TABLET | Freq: Every day | ORAL | Status: DC
  Administered 2018-05-13 – 2018-05-19 (×7): 2 mg via ORAL
  Filled 2018-05-12 (×7): qty 1

## 2018-05-12 MED ORDER — MONTELUKAST SODIUM 10 MG PO TABS
10.0000 mg | ORAL_TABLET | Freq: Every day | ORAL | Status: DC
  Administered 2018-05-12 – 2018-05-19 (×8): 10 mg via ORAL
  Filled 2018-05-12 (×9): qty 1

## 2018-05-12 MED ORDER — FOLIC ACID 1 MG PO TABS
1.0000 mg | ORAL_TABLET | Freq: Every day | ORAL | Status: DC
  Administered 2018-05-13 – 2018-05-20 (×8): 1 mg via ORAL
  Filled 2018-05-12 (×8): qty 1

## 2018-05-12 MED ORDER — DEXAMETHASONE 2 MG PO TABS
2.0000 mg | ORAL_TABLET | Freq: Every day | ORAL | Status: DC
  Administered 2018-05-12: 2 mg via ORAL
  Filled 2018-05-12: qty 1

## 2018-05-12 NOTE — Progress Notes (Signed)
  71 year old female with lumbar spinal stenosis, atrial fibrillation, diastolic congestive heart failure who underwent L1 L2-L3-L4 L4-L5 interbody arthrodesis with XLIF device allograft mixed with infuse as well as posterior percutaneous pedicular screw fixation from L1-S1 on 05/06/2018.  0patient states that when she times her pain medication correctly she is able to be more active.  Reviewed PT and OT notes from today.  Patient was able ambulate with min guard assist 100 feet, OT reports mod assist level with lower body bathing and dressing. Husband in the room today he is being treated for knee osteoarthritis and has another appointment with his orthopedic surgeon presumably to discuss surgery.  There is a sister that may be able to assist the patient.  As discussed with patient and her husband I do think completing inpatient rehab would allow her to return home with family assistance only for lower body bathing and dressing.  Estimated length of stay 7 to 10 days

## 2018-05-12 NOTE — Progress Notes (Signed)
Inpatient Rehabilitation-Admissions Coordinator   Pt's insurance has issued an initial denial for CIR, but did offer a chance for peer to peer to attempt to overturn the decision. Dr. Delice Lesch has agreed to complete peer to peer with the medical director at [facility]. AC will update once final decision has been made.   Please call if questions.   Jhonnie Garner, OTR/L  Rehab Admissions Coordinator  [phone] 05/12/2018 2:41 PM

## 2018-05-12 NOTE — Progress Notes (Signed)
Patient ID: [name], female   DOB: July 28, 1945, 72 y.o.   MRN: [id number] Vital signs are stable Bowels have moved minimally Still with significant left leg pain CT scan shows good decompression of all neural elements and overall alignment and stability We will add low-dose of Neurontin at bedtime Also change to oral Decadron and decrease dose Continue to monitor Rehab evaluation pending

## 2018-05-12 NOTE — PMR Pre-admission (Signed)
PMR Admission Coordinator Pre-Admission Assessment  Patient: [name] is an 72 y.o., female MRN: [id number] DOB: May 01, 1946 Height: _0  (165.1 cm) Weight: 117.6 kg              Insurance Information HMO:     PPO: Yes     PCP:      IPA:      80/20:      OTHER:  PRIMARY: [employer]      Policy#: [id number]      Subscriber: Patient CM Name: [name]      Phone#: [phone]     Fax#: [phone] Pre-Cert#: [id number]      Employer:  Benefits:  Phone #: [phone]     Name: [employer] HTA Portal Eff. Date: 07-13-17     Deduct: $0      Out of Pocket Max: $3,400 (met [phone])      Life Max: NA CIR: $295/day for days 1-6, $0/day 7+      SNF: $20/day for days 1-20, $160/day for days 21-100 (100 day limit) Outpatient: per necessity      Co-Pay: $15/visit Home Health: per necessity      Co-Pay: 100% DME: 80%     Co-Pay: 20% Providers:  SECONDARY:       Policy#:       Subscriber:  CM Name:       Phone#:      Fax#:  Pre-Cert#:       Employer:  Benefits:  Phone #:      Name:  Eff. Date:      Deduct:       Out of Pocket Max:       Life Max:  CIR:       SNF:  Outpatient:      [place]:       Co-Pay:  DME:      Co-Pay:   Medicaid Application Date:       Case Manager:  Disability Application Date:       Case Worker:   Emergency Contact Information Contact Information    Name Relation Home Work Mobile   Gielow,Dean Spouse [phone]       Current Medical History  Patient Admitting Diagnosis: Lumbar stenosis with radiculopathy s/p decompression History of Present Illness: [name] is a 72 year old right-handed female with history of chronic atrial fibrillation as well as first-degree AV block maintained on Eliquis, chronic diastolic congestive heart failure, prediabetes, ACDF in 2012.  Per chart review and patient, patient lives with spouse.  One level home.  Independent prior to admission recently using a cane.  Husband works during the day.  Good support  of extended family.  Presented 05/06/2018 with low back pain radiating to lower extremities.  X-rays and imaging revealed involvement of L1-3-4 and 4-5 degenerative scoliosis spinal stenosis with radiculopathy.  Underwent decompression interbody arthrodesis and fusion 05/06/2018 per Dr. Ellene Route.  Patient difficult to extubate in PACU with critical care consulted later extubated 05/07/2018 Hospital course pain management.  Back brace when out of bed.  Hospital course acute blood loss anemia 7.9 transfuse hemoglobin 10.5.  Decadron protocol as indicated.  Noted to be a bit hypotensive felt to be related to sedation and monitored.  Chronic Eliquis has been resumed.  Therapy evaluations completed with recommendations of physical medicine rehab consult.  Patient is to be admitted for a comprehensive rehab program.      Past Medical History  Past Medical History:  Diagnosis Date  . Acute  hypoxemic respiratory failure ([place]) 10/24/2016  . Arthritis    both knees  . Atrial fibrillation, chronic   . Carpal tunnel syndrome, bilateral    both hands go to sleep at times from elbows down  . Chronic diastolic heart failure ([place])   . DDD (degenerative disc disease), lumbosacral   . Dyspnea   . First degree AV block   . Hematuria 11/15/2017  . History of kidney stones   . History of rib fracture 03/2011  . Lumbar stenosis   . Obesity   . Postlaminectomy syndrome   . Pre-diabetes   . Renal calculi    BILATERAL  . Right ureteral stone   . Rotator cuff tear, left   . Rotator cuff tear, right     Family History  family history includes Diabetes in her brother, brother, and sister; Diabetes type II in her mother; Heart disease in her brother and brother; Hypertension in her brother, brother, and sister; Lung cancer in her father.  Prior Rehab/Hospitalizations:  Has the patient had major surgery during 100 days prior to admission? No  Current Medications   Current Facility-Administered Medications:   .  0.9 %  sodium chloride infusion, 250 mL, Intravenous, Continuous, Elsner, Henry, MD .  0.9 %  sodium chloride infusion, 250 mL, Intravenous, Continuous, Desai, Rahul P, PA-C, Stopped at 05/09/18 5456 .  acetaminophen (TYLENOL) tablet 650 mg, 650 mg, Oral, Q4H PRN, 650 mg at 05/12/18 0941 **OR** acetaminophen (TYLENOL) suppository 650 mg, 650 mg, Rectal, Q4H PRN, Kristeen Miss, MD .  alum & mag hydroxide-simeth (MAALOX/MYLANTA) 200-200-20 MG/5ML suspension 30 mL, 30 mL, Oral, Q6H PRN, Kristeen Miss, MD .  apixaban (ELIQUIS) tablet 5 mg, 5 mg, Oral, BID, Kristeen Miss, MD, 5 mg at 05/12/18 0942 .  bisacodyl (DULCOLAX) suppository 10 mg, 10 mg, Rectal, Daily PRN, Kristeen Miss, MD, 10 mg at 05/08/18 1617 .  dexamethasone (DECADRON) tablet 2 mg, 2 mg, Oral, Daily, Kristeen Miss, MD, 2 mg at 05/12/18 1344 .  diltiazem (CARDIZEM CD) 24 hr capsule 180 mg, 180 mg, Oral, Daily, Bonnielee Haff, MD, 180 mg at 05/12/18 0942 .  docusate sodium (COLACE) capsule 100 mg, 100 mg, Oral, BID, Kristeen Miss, MD, 100 mg at 05/12/18 0942 .  folic acid (FOLVITE) tablet 1 mg, 1 mg, Oral, Daily, Bonnielee Haff, MD, 1 mg at 05/12/18 0943 .  gabapentin (NEURONTIN) capsule 200 mg, 200 mg, Oral, QHS, Elsner, Henry, MD .  hydrALAZINE (APRESOLINE) injection 10 mg, 10 mg, Intravenous, Q8H PRN, Regalado, Belkys A, MD, 5 mg at 05/12/18 0649 .  hydrochlorothiazide (HYDRODIURIL) tablet 25 mg, 25 mg, Oral, Daily, Regalado, Belkys A, MD, 25 mg at 05/11/18 0910 .  menthol-cetylpyridinium (CEPACOL) lozenge 3 mg, 1 lozenge, Oral, PRN **OR** phenol (CHLORASEPTIC) mouth spray 1 spray, 1 spray, Mouth/Throat, PRN, Kristeen Miss, MD, 1 spray at 05/07/18 1747 .  methocarbamol (ROBAXIN) tablet 500 mg, 500 mg, Oral, Q6H PRN, 500 mg at 05/12/18 1344 **OR** methocarbamol (ROBAXIN) 500 mg in dextrose 5 % 50 mL IVPB, 500 mg, Intravenous, Q6H PRN, Kristeen Miss, MD, Stopped at 05/07/18 2117 .  montelukast (SINGULAIR) tablet 10 mg, 10 mg, Oral,  QHS, Kristeen Miss, MD, 10 mg at 05/11/18 2121 .  morphine 2 MG/ML injection 2 mg, 2 mg, Intravenous, Q2H PRN, Kristeen Miss, MD, 1 mg at 05/11/18 2231 .  ondansetron (ZOFRAN) tablet 4 mg, 4 mg, Oral, Q6H PRN **OR** ondansetron (ZOFRAN) injection 4 mg, 4 mg, Intravenous, Q6H PRN, Kristeen Miss, MD .  oxyCODONE (Oxy  IR/ROXICODONE) immediate release tablet 5 mg, 5 mg, Oral, Q3H PRN, Kristeen Miss, MD, 5 mg at 05/12/18 1344 .  polyethylene glycol (MIRALAX / GLYCOLAX) packet 17 g, 17 g, Oral, BID, Regalado, Belkys A, MD, 17 g at 05/12/18 0942 .  senna (SENOKOT) tablet 8.6 mg, 1 tablet, Oral, BID, Kristeen Miss, MD, 8.6 mg at 05/12/18 0942 .  sodium chloride flush (NS) 0.9 % injection 3 mL, 3 mL, Intravenous, Q12H, Kristeen Miss, MD, 3 mL at 05/11/18 2127 .  sodium chloride flush (NS) 0.9 % injection 3 mL, 3 mL, Intravenous, PRN, Kristeen Miss, MD .  sodium phosphate (FLEET) 7-19 GM/118ML enema 1 enema, 1 enema, Rectal, Daily PRN, Bonnielee Haff, MD .  vitamin B-12 (CYANOCOBALAMIN) tablet 500 mcg, 500 mcg, Oral, Daily, Bonnielee Haff, MD, 500 mcg at 05/12/18 0941  Patients Current Diet:  Diet Order            Diet Heart Room service appropriate? Yes; Fluid consistency: Thin  Diet effective now              Precautions / Restrictions Precautions Precautions: Back, Fall Precaution Booklet Issued: Yes (comment) Precaution Comments: pt able to state 1/3 precautions with all reviewed Spinal Brace: Lumbar corset, Applied in sitting position Restrictions Weight Bearing Restrictions: No   Has the patient had 2 or more falls or a fall with injury in the past year?No  Prior Activity Level Community (5-7x/wk): active; retired; worked Engineer, production; drove Radiographer, therapeutic / Paramedic Devices/Equipment: Radio producer (specify quad or straight), Environmental consultant (specify type) Home Equipment: Environmental consultant - 2 wheels, Environmental consultant - 4 wheels, Harts - single point, Grab bars - tub/shower, Grab bars -  toilet, Shower seat - built in, [employer] held shower head, Adaptive equipment  Prior Device Use: Indicate devices/aids used by the patient prior to current illness, exacerbation or injury? single point cane  Prior Functional Level Prior Function Level of Independence: Independent Comments: very limited mobility lately  Self Care: Did the patient need help bathing, dressing, using the toilet or eating?  Independent  Indoor Mobility: Did the patient need assistance with walking from room to room (with or without device)? Independent  Stairs: Did the patient need assistance with internal or external stairs (with or without device)? Independent  Functional Cognition: Did the patient need help planning regular tasks such as shopping or remembering to take medications? Independent  Current Functional Level Cognition  Overall Cognitive Status: Within Functional Limits for tasks assessed Orientation Level: Oriented X4 General Comments: pt very anxious regarding pain and not being able to go home because her husband cant care for her because he needs knee surgery    Extremity Assessment (includes Sensation/Coordination)  Upper Extremity Assessment: Overall WFL for tasks assessed  Lower Extremity Assessment: Defer to PT evaluation LLE Deficits / Details: gross strength >3/5, hams 3-,  hip flexors 2+,  painful lifting against gravity more than w/bearing LLE Coordination: decreased fine motor    ADLs  Overall ADL's : Needs assistance/impaired Eating/Feeding: Independent Grooming: Set up, Standing, Supervision/safety(used two cups to avoid bending over sink) Upper Body Bathing: Set up, Sitting Lower Body Bathing: Sit to/from stand, Moderate assistance Upper Body Dressing : Sitting, Set up Upper Body Dressing Details (indicate cue type and reason): able to donn brace independently Lower Body Dressing: Moderate assistance, Sit to/from stand Lower Body Dressing Details (indicate cue type and  reason): Pt reports he has all AE at home. Will need to bring in from home or purchase new  AE Toilet Transfer: Ambulation, BSC, Min guard, Grab bars, RW(over toilet) Toileting- Clothing Manipulation and Hygiene: Sit to/from stand, Minimal assistance, With adaptive equipment(Pt issued toilet aid last visit) Toileting - Clothing Manipulation Details (indicate cue type and reason): Pt can adjust her own clothing, but needs A for toileting hygiene--I showed her how a toilet aid works and gave her one to take home Tub/Shower Transfer Details (indicate cue type and reason): pt has a walk in shower, built in seat, grab bar, and hand held shower--do not foresee any issue with her being able to do shower at a S level with maybe A for lower legs (she spoke of getting a long handled sponge) Functional mobility during ADLs: Minimal assistance, Rolling walker, Cueing for safety General ADL Comments: Used AE PTA due to R knee surgery and back pain    Mobility  Overal bed mobility: Needs Assistance Bed Mobility: Rolling, Sidelying to Sit Rolling: Supervision Sidelying to sit: Supervision Sit to sidelying: Min assist General bed mobility comments: cues for sequence particularly not to twist with rolling and positioning in bed, min assist to bring LLE off of and onto bed with mobility    Transfers  Overall transfer level: Needs assistance Equipment used: Rolling walker (2 wheeled) Transfers: Sit to/from Stand Sit to Stand: Min guard, Min assist General transfer comment: min assist to rise from bed, minguard to rise from [facility] over toilet, cues for hand placement and posture    Ambulation / Gait / Stairs / Wheelchair Mobility  Ambulation/Gait Ambulation/Gait assistance: Counsellor (Feet): 100 Feet Assistive device: Rolling walker (2 wheeled) Gait Pattern/deviations: Step-through pattern, Decreased stride length General Gait Details: slow and guarded gait limited by pain Gait velocity:  slow Gait velocity interpretation: 1.31 - 2.62 ft/sec, indicative of limited community ambulator Stairs: Yes Stairs assistance: Min assist Stair Management: Step to pattern, Forwards, One rail Left Number of Stairs: 1 General stair comments: one step with rail and HHA with cues for sequence    Posture / Balance Balance Overall balance assessment: Needs assistance Sitting-balance support: No upper extremity supported, Feet supported Sitting balance-Leahy Scale: Good Standing balance support: Bilateral upper extremity supported Standing balance-Leahy Scale: Fair Standing balance comment: reliance on the RW and external support    Special needs/care consideration BiPAP/CPAP: no CPM: no Continuous Drip IV: no Dialysis: no        Days: no Life Vest: no Oxygen: no Special Bed: no Trach Size: no Wound Vac (area): no      Location: no Skin: right flank incision; back incision; ecchymosis on Left anterior arm, flank and lumbar region   Bowel mgmt:last charted BM 05/11/18 (pt reports a BM: 05/12/18 as well) Bladder mgmt: continent Diabetic mgmt: no     Previous Home Environment Living Arrangements: Spouse/significant other Available Help at Discharge: Family, Other (Comment)(including her sister) Type of Home: House Home Layout: One level Home Access: Stairs to enter Entrance Stairs-Rails: Right, Left Bathroom Shower/Tub: Multimedia programmer: Handicapped height Bathroom Accessibility: Yes How Accessible: Accessible via wheelchair Home Care Services: No  Discharge Living Setting Plans for Discharge Living Setting: Patient's home, Lives with (comment)(lives with husband) Type of Home at Discharge: House Discharge Home Layout: One level Discharge Home Access: Stairs to enter Entrance Stairs-Rails: None Entrance Stairs-Number of Steps: one step Discharge Bathroom Shower/Tub: Walk-in shower Discharge Bathroom Toilet: Handicapped height Discharge Bathroom  Accessibility: Yes How Accessible: Accessible via walker Does the patient have any problems obtaining your medications?: No  Social/Family/Support Systems Patient  Roles: Spouse Contact Information: husband is emergency contact Anticipated Caregiver: [name] (husband) [phone] Anticipated Caregiver's Contact Information: see above Ability/Limitations of Caregiver: supervision  Caregiver Availability: 24/7 Discharge Plan Discussed with Primary Caregiver: Yes Is Caregiver In Agreement with Plan?: Yes Does Caregiver/Family have Issues with Lodging/Transportation while Pt is in Rehab?: No   Goals/Additional Needs Patient/Family Goal for Rehab: PT/OT: Supervision; SLP: NA Expected length of stay: 5-7 days Cultural Considerations: [employer] Dietary Needs: heart healthy, thin liquids Equipment Needs: TBD Pt/Family Agrees to Admission and willing to participate: Yes Program Orientation Provided & Reviewed with Pt/Caregiver Including Roles  & Responsibilities: Yes(reviewed with husband)  Barriers to Discharge: Inaccessible home environment, Decreased caregiver support  Barriers to Discharge Comments: 1 step to enter; pain, husband only able to provide supervision currently due to own medical issues   Decrease burden of Care through IP rehab admission: NA   Possible need for SNF placement upon discharge: Not anticipated; pt has good assist from family at DC and has good prognosis for further progress   Patient Condition: This patient's medical and functional status has changed since the consult dated 05/09/18 in which the Rehabilitation Physician documented that the patient was not appropriate for intensive rehabilitative care in an inpatient rehabilitation facility. Due to change in status and issues being addressed, patient's case has been discussed with Dr. Letta Pate  as (well as Dr. Posey Pronto, who performed initial consult) and patient now appropriate for inpatient rehabilitation. Will admit to  inpatient rehab today.  Preadmission Screen Completed By:  Jhonnie Garner, 05/12/2018 4:42 PM ______________________________________________________________________   Discussed status with Dr. Letta Pate on 05/12/18 at 4:42PM and received telephone approval for admission today.  Admission Coordinator:  Jhonnie Garner, time 4:42PM/Date 05/12/18

## 2018-05-12 NOTE — Progress Notes (Addendum)
Physical Therapy Treatment Patient Details Name: [name] MRN: [id number] DOB: Jun 27, 1946 Today's Date: 05/12/2018    History of Present Illness pt is a 72 y/o female with h/o DDD,  bil rot cuff sx, afib, admitted with significant back and bil LE pain for degenerative scoliosis.  Pt s/p Anterolateral decompression L1-5 with fusion using XLIF and bone grafting.  Further fixated posteriorly with pedicular screws from L1-S1.    PT Comments    PT with continued lack of progression from 2 days prior. Pt limited by weakness and pain particularly of LLE. Pt requires assist for all transfers and gait distance not progressing. Pt reports spouse's knee is not better and he would be unable to assist her at home at this time. Will continue to follow. BP 152/72 HR 84    Follow Up Recommendations  CIR;Supervision for mobility/OOB     Equipment Recommendations  None recommended by PT    Recommendations for Other Services       Precautions / Restrictions Precautions Precautions: Back;Fall Precaution Comments: pt able to state 1/3 precautions with all reviewed Required Braces or Orthoses: Spinal Brace Spinal Brace: Lumbar corset;Applied in sitting position    Mobility  Bed Mobility Overal bed mobility: Needs Assistance Bed Mobility: Rolling;Sidelying to Sit;Sit to Sidelying Rolling: Min guard Sidelying to sit: Min assist     Sit to sidelying: Min assist General bed mobility comments: cues for sequence particularly not to twist with rolling and positioning in bed, min assist to bring LLE off of and onto bed with mobility  Transfers Overall transfer level: Needs assistance   Transfers: Sit to/from Stand Sit to Stand: Min guard;Min assist         General transfer comment: min assist to rise from bed, minguard to rise from [facility] over toilet, cues for hand placement and posture  Ambulation/Gait Ambulation/Gait assistance: Min guard Gait Distance (Feet): 100 Feet Assistive  device: Rolling walker (2 wheeled) Gait Pattern/deviations: Step-through pattern;Decreased stride length   Gait velocity interpretation: 1.31 - 2.62 ft/sec, indicative of limited community ambulator General Gait Details: slow and guarded gait limited by pain   Stairs             Wheelchair Mobility    Modified Rankin (Stroke Patients Only)       Balance Overall balance assessment: Needs assistance   Sitting balance-Leahy Scale: Good     Standing balance support: Bilateral upper extremity supported Standing balance-Leahy Scale: Fair Standing balance comment: reliance on the RW and external support                            Cognition Arousal/Alertness: Awake/alert Behavior During Therapy: WFL for tasks assessed/performed Overall Cognitive Status: Within Functional Limits for tasks assessed                                        Exercises      General Comments        Pertinent Vitals/Pain Pain Score: 5  Pain Location: left groin Pain Descriptors / Indicators: Aching;Guarding Pain Intervention(s): Limited activity within patient's tolerance;Repositioned;Monitored during session;Premedicated before session    Home Living                      Prior Function            PT Goals (current goals  can now be found in the care plan section) Progress towards PT goals: Progressing toward goals    Frequency           PT Plan Current plan remains appropriate    Co-evaluation              AM-PAC PT "6 Clicks" Daily Activity  Outcome Measure  Difficulty turning over in bed (including adjusting bedclothes, sheets and blankets)?: Unable Difficulty moving from lying on back to sitting on the side of the bed? : Unable Difficulty sitting down on and standing up from a chair with arms (e.g., wheelchair, bedside commode, etc,.)?: Unable Help needed moving to and from a bed to chair (including a wheelchair)?: A  Little Help needed walking in hospital room?: A Little Help needed climbing 3-5 steps with a railing? : A Little 6 Click Score: 12    End of Session Equipment Utilized During Treatment: Back brace Activity Tolerance: Patient tolerated treatment well;Patient limited by pain Patient left: in bed;with call bell/phone within reach Nurse Communication: Mobility status PT Visit Diagnosis: Unsteadiness on feet (R26.81);Other abnormalities of gait and mobility (R26.89);Muscle weakness (generalized) (M62.81)     Time: 6767-2094 PT Time Calculation (min) (ACUTE ONLY): 24 min  Charges:  $Gait Training: 8-22 mins $Therapeutic Activity: 8-22 mins                     [place], PT Acute Rehabilitation Services Pager: [phone] Office: [place] 05/12/2018, 9:19 AM

## 2018-05-12 NOTE — Progress Notes (Signed)
Inpatient Rehabilitation-Admissions Coordinator   Per [employer] company, the initial denial has been overturned and pt's request for CIR has been approved. AC has received both insurance approval and medical approval for admit to CIR today.  AC will notified pt, floor RN, CM, and SW regarding plan.   Please call if questions.   Jhonnie Garner, OTR/L  Rehab Admissions Coordinator  [phone] 05/12/2018 4:50 PM

## 2018-05-12 NOTE — Progress Notes (Signed)
Physical Medicine and Rehabilitation Consult Reason for Consult: Decreased functional mobility Referring Physician: Dr. Ellene Route   HPI: [name] is a 72 y.o. right-handed female with history of chronic atrial fibrillation as well as first-degree AV block maintained on Eliquis, chronic diastolic congestive heart failure, prediabetes, ACDF 2012.  Per chart review and patient, patient lives with spouse.  One level home.  Independent prior to admission and recently started using a cane.  Husband works during the day.  Good support of extended family.  Presented 05/06/2018 with low back pain radiating to the lower extremities.  X-rays and imaging revealed involvement of L1-2, 3-4 and 4-5 degenerative scoliosis/spinal stenosis with radiculopathy.  Underwent decompression L1-2, 3-4, 4-5 interbody arthrodesis and fusion 05/06/2018 per Dr. Ellene Route.  Hospital course pain management.  Back brace when out of bed applied in sitting position.  Hospital course acute blood loss anemia 7.9.  Discussed with attending physician, with orders for transfusion. Patient difficult to extubate in PACU with critical care consulted she was later extubated 05/07/2018 initial elevation in lactic acid 4.8 normalized to 1.8.  Noted to be hypotensive felt to be related to sedation that did improve.  Await plan to resume chronic Eliquis per neurosurgery.  Therapy evaluations completed with recommendations of physical medicine rehab consult.  Review of Systems  Constitutional: Negative for chills and fever.  HENT: Negative for hearing loss.   Eyes: Negative for blurred vision and double vision.  Respiratory: Negative for cough and shortness of breath.   Cardiovascular: Positive for leg swelling. Negative for chest pain.  Gastrointestinal: Positive for constipation. Negative for nausea and vomiting.  Genitourinary: Negative for dysuria, flank pain and hematuria.  Musculoskeletal: Positive for back pain, joint pain and myalgias.    Skin: Negative for rash.  All other systems reviewed and are negative.      Past Medical History:  Diagnosis Date  . Acute hypoxemic respiratory failure ([place]) 10/24/2016  . Arthritis    both knees  . Atrial fibrillation, chronic   . Carpal tunnel syndrome, bilateral    both hands go to sleep at times from elbows down  . Chronic diastolic heart failure ([place])   . DDD (degenerative disc disease), lumbosacral   . Dyspnea   . First degree AV block   . Hematuria 11/15/2017  . History of kidney stones   . History of rib fracture 03/2011  . Lumbar stenosis   . Obesity   . Postlaminectomy syndrome   . Pre-diabetes   . Renal calculi    BILATERAL  . Right ureteral stone   . Rotator cuff tear, left   . Rotator cuff tear, right         Past Surgical History:  Procedure Laterality Date  . ANTERIOR CERVICAL DECOMP/DISCECTOMY FUSION  03-18-2011   C3 -- C7  . APPENDECTOMY    . BILATERAL CARPAL TUNNEL RELEASE     3 screws in right hand, thumb, middle finger, and index finger,   . CARDIAC CATHETERIZATION N/A 01/16/2015   Procedure: Left Heart Cath and Coronary Angiography;  Surgeon: Belva Crome, MD;  Location: [place] [facility];  Service: Cardiovascular;  Laterality: N/A;  . CHOLECYSTECTOMY OPEN  1970'S  . COLONOSCOPY  2018  . CYSTOSCOPY WITH RETROGRADE PYELOGRAM, URETEROSCOPY AND STENT PLACEMENT N/A 02/13/2013   Procedure: CYSTOSCOPY WITH RETROGRADE PYELOGRAM, URETEROSCOPY AND LITHOTRIPSY ;  Surgeon: Claybon Jabs, MD;  Location: [facility];  Service: Urology;  Laterality: N/A;  . CYSTOSCOPY/URETEROSCOPY/HOLMIUM LASER/STENT PLACEMENT Right 11/22/2017  Procedure: CYSTOSCOPY/URETEROSCOPY/RETROGRADE PYELOGRAM/HOLMIUM LASER/STENT PLACEMENT;  Surgeon: Kathie Rhodes, MD;  Location: [facility];  Service: Urology;  Laterality: Right;  . EXTRACORPOREAL SHOCK WAVE LITHOTRIPSY Right 05-16-2012  . FINGER ARTHROSCOPY WITH  CARPOMETACARPEL ([facility]) ARTHROPLASTY    . HOLMIUM LASER APPLICATION Right 07/20/4079   Procedure: HOLMIUM LASER APPLICATION;  Surgeon: Claybon Jabs, MD;  Location: [facility];  Service: Urology;  Laterality: Right;  . KNEE ARTHROSCOPY Bilateral   . LUMBAR LAMINECTOMY  X2  1960'S  . NASAL SEPTUM SURGERY  1970'S  . SHOULDER OPEN ROTATOR CUFF REPAIR Bilateral LEFT 01-11-2001/   RIGHT 07-21-2002  . TOTAL KNEE ARTHROPLASTY  04/13/2012   Procedure: TOTAL KNEE ARTHROPLASTY;  Surgeon: Gearlean Alf, MD;  Location: [facility];  Service: Orthopedics;  Laterality: Left;  . TOTAL KNEE ARTHROPLASTY  08/08/2012   Procedure: TOTAL KNEE ARTHROPLASTY;  Surgeon: Gearlean Alf, MD;  Location: [facility];  Service: Orthopedics;  Laterality: Right;  . TRANSTHORACIC ECHOCARDIOGRAM  03-29-2011   MODERATE LVH/ EF 60-65%/ MILD MR  . VAGINAL HYSTERECTOMY  1960'S        Family History  Problem Relation Age of Onset  . Diabetes type II Mother   . Diabetes Sister   . Hypertension Sister   . Lung cancer Father   . Hypertension Brother   . Diabetes Brother   . Heart disease Brother   . Diabetes Brother   . Hypertension Brother   . Heart disease Brother    Social History:  reports that she has never smoked. She has never used smokeless tobacco. She reports that she does not drink alcohol or use drugs. Allergies:       Allergies  Allergen Reactions  . Tape Swelling    Adhesive Tape         Medications Prior to Admission  Medication Sig Dispense Refill  . acetaminophen (TYLENOL) 325 MG tablet Take 325-650 mg by mouth every 6 (six) hours as needed for mild pain.     Marland Kitchen apixaban (ELIQUIS) 5 MG TABS tablet Take 1 tablet (5 mg total) by mouth 2 (two) times daily. 180 tablet 3  . diclofenac sodium (VOLTAREN) 1 % GEL Apply 2 g topically 3 (three) times daily as needed (back pain).     Marland Kitchen diltiazem (CARTIA XT) 120 MG 24 hr capsule Take 1 capsule (120 mg total) by mouth daily.  90 capsule 3  . montelukast (SINGULAIR) 10 MG tablet Take 10 mg by mouth at bedtime.      Home: Home Living Family/patient expects to be discharged to:: Private residence Living Arrangements: Spouse/significant other Available Help at Discharge: Family, Other (Comment)(including her sister) Type of Home: House Home Access: Stairs to enter Entrance Stairs-Rails: Right, Left Home Layout: One level Bathroom Shower/Tub: Multimedia programmer: Handicapped height Bathroom Accessibility: Yes Home Equipment: Environmental consultant - 2 wheels, Walker - 4 wheels, Cane - single point, Grab bars - tub/shower, Grab bars - toilet, Shower seat - built in, [employer] held shower head, Financial controller: Reacher, Sock aid, Long-handled shoe horn  Functional History: Prior Function Level of Independence: Independent Comments: very limited mobility lately Functional Status:  Mobility: Bed Mobility Overal bed mobility: Needs Assistance Bed Mobility: Rolling, Sidelying to Sit Rolling: Supervision Sidelying to sit: Supervision Sit to sidelying: Min assist General bed mobility comments: Needed min assist to get LEs back into bed Transfers Overall transfer level: Needs assistance Transfers: Sit to/from Stand Sit to Stand: Min guard General transfer comment: cues for  hand placement and both boost and forward assist Ambulation/Gait Ambulation/Gait assistance: Min guard Gait Distance (Feet): 65 Feet Assistive device: Rolling walker (2 wheeled) Gait Pattern/deviations: Step-to pattern General Gait Details: short, tentative, step to pattern.  consistent VC for sequencing Gait velocity: slow Gait velocity interpretation: <1.31 ft/sec, indicative of household ambulator  ADL: ADL Overall ADL's : Needs assistance/impaired Grooming: Set up, Supervision/safety, Sitting Upper Body Bathing: Set up, Sitting Lower Body Bathing: Sit to/from stand, Moderate assistance Upper Body Dressing :  Sitting, Minimal assistance Upper Body Dressing Details (indicate cue type and reason): for donninh brace Lower Body Dressing: Moderate assistance, Sit to/from stand Toilet Transfer: Min guard, Ambulation Toileting- Clothing Manipulation and Hygiene: Moderate assistance, Sit to/from stand Functional mobility during ADLs: Minimal assistance, Rolling walker, Cueing for safety General ADL Comments: Used AE PTA due to R knee surgery adn back pain  Cognition: Cognition Overall Cognitive Status: Within Functional Limits for tasks assessed Orientation Level: Oriented X4 Cognition Arousal/Alertness: Awake/alert Behavior During Therapy: WFL for tasks assessed/performed Overall Cognitive Status: Within Functional Limits for tasks assessed  Blood pressure (!) 142/70, pulse 73, temperature 97.8 F (36.6 C), temperature source Oral, resp. rate 10, height 5\' 5"  (1.651 m), weight 117.6 kg, SpO2 99 %. Physical Exam  Vitals reviewed. Constitutional: She is oriented to person, place, and time. She appears well-developed.  Morbidly obese  HENT:  Head: Normocephalic and atraumatic.  Eyes: EOM are normal. Right eye exhibits no discharge. Left eye exhibits no discharge.  Neck: Normal range of motion. Neck supple. No thyromegaly present.  Cardiovascular:  Irregularly irregular  Respiratory: Effort normal and breath sounds normal. No respiratory distress.  GI: Soft. She exhibits no distension.  Bowel sounds slowed  Musculoskeletal:  LUE, B/l LE edema  Neurological: She is alert and oriented to person, place, and time.  Motor: B/l UE 5/5 proximal to distal B/l LE: HF 2+/5, KE 4-/5, ADF 4/5  Skin:  Back incision is dressed  Psychiatric: She has a normal mood and affect. Her behavior is normal. Thought content normal.    LabResultsLast24Hours  Results for orders placed or performed during the hospital encounter of 05/06/18 (from the past 24 hour(s))  CBC     Status: Abnormal   Collection  Time: 05/08/18  8:23 AM  Result Value Ref Range   WBC 14.7 (H) 4.0 - 10.5 K/uL   RBC 2.79 (L) 3.87 - 5.11 MIL/uL   Hemoglobin 7.9 (L) 12.0 - 15.0 g/dL   HCT 25.7 (L) 36.0 - 46.0 %   MCV 92.1 80.0 - 100.0 fL   MCH 28.3 26.0 - 34.0 pg   MCHC 30.7 30.0 - 36.0 g/dL   RDW 13.8 11.5 - 15.5 %   Platelets 252 150 - 400 K/uL   nRBC 0.0 0.0 - 0.2 %  Vitamin B12     Status: None   Collection Time: 05/08/18  8:23 AM  Result Value Ref Range   Vitamin B-12 271 180 - 914 pg/mL  Folate     Status: Abnormal   Collection Time: 05/08/18  8:23 AM  Result Value Ref Range   Folate 5.9 (L) >5.9 ng/mL  Iron and TIBC     Status: None   Collection Time: 05/08/18  8:23 AM  Result Value Ref Range   Iron 47 28 - 170 ug/dL   TIBC 256 250 - 450 ug/dL   Saturation Ratios 18 10.4 - 31.8 %   UIBC 209 ug/dL  Ferritin     Status: None  Collection Time: 05/08/18  8:23 AM  Result Value Ref Range   Ferritin 240 11 - 307 ng/mL  Reticulocytes     Status: Abnormal   Collection Time: 05/08/18  8:23 AM  Result Value Ref Range   Retic Ct Pct 1.6 0.4 - 3.1 %   RBC. 2.79 (L) 3.87 - 5.11 MIL/uL   Retic Count, Absolute 44.4 19.0 - 186.0 K/uL   Immature Retic Fract 5.5 2.3 - 15.9 %  Lactic acid, plasma     Status: None   Collection Time: 05/08/18  8:23 AM  Result Value Ref Range   Lactic Acid, Venous 1.8 0.5 - 1.9 mmol/L  Glucose, capillary     Status: Abnormal   Collection Time: 05/08/18  9:11 AM  Result Value Ref Range   Glucose-Capillary 164 (H) 70 - 99 mg/dL   Comment 1 Notify RN    Comment 2 Document in Chart   Glucose, capillary     Status: Abnormal   Collection Time: 05/08/18 12:11 PM  Result Value Ref Range   Glucose-Capillary 167 (H) 70 - 99 mg/dL  Glucose, capillary     Status: Abnormal   Collection Time: 05/08/18  4:27 PM  Result Value Ref Range   Glucose-Capillary 182 (H) 70 - 99 mg/dL   Comment 1 Notify RN    Comment 2 Document in Chart   Glucose,  capillary     Status: Abnormal   Collection Time: 05/08/18  9:03 PM  Result Value Ref Range   Glucose-Capillary 169 (H) 70 - 99 mg/dL     ImagingResults(Last48hours)  No results found.    Assessment/Plan: Diagnosis: Lumbar stenosis with radiculopathy s/p decompression Labs independently reviewed.  Records reviewed and summated above.  1. Does the need for close, 24 hr/day medical supervision in concert with the patient's rehab needs make it unreasonable for this patient to be served in a less intensive setting? No  2. Co-Morbidities requiring supervision/potential complications: chronic atrial fibrillation (resume meds when appropriate, monitor HR with increased physical activity), chronic diastolic congestive heart failure (monitor for signs/symptoms of fluid overload), prediabetes (Monitor in accordance with exercise and adjust meds as necessary), ACDF 2012, post-op pain management (Biofeedback training with therapies to help reduce reliance on opiate pain medications, monitor pain control during therapies, and sedation at rest and titrate to maximum efficacy to ensure participation and gains in therapies), acute blood loss anemia (transfusion today, ensure appropriate perfusion for increased activity tolerance), leukocytosis (repeat labs, cont to monitor for signs and symptoms of infection, further workup if indicated) 3. Due to bladder management, safety, skin/wound care, disease management, pain management and patient education, does the patient require 24 hr/day rehab nursing? No 4. Does the patient require coordinated care of a physician, rehab nurse, PT (1-2 hrs/day, 5 days/week) and OT (1-2 hrs/day, 5 days/week) to address physical and functional deficits in the context of the above medical diagnosis(es)? No Addressing deficits in the following areas: balance, endurance, locomotion, strength, bathing, dressing, toileting and psychosocial support 5. Can the patient actively  participate in an intensive therapy program of at least 3 hrs of therapy per day at least 5 days per week? Yes 6. The potential for patient to make measurable gains while on inpatient rehab is good 7. Anticipated functional outcomes upon discharge from inpatient rehab are n/a  with PT, n/a with OT, n/a with SLP. 8. Estimated rehab length of stay to reach the above functional goals is: NA. 9. Anticipated D/C setting: Home 10. Anticipated post D/C  treatments: HH therapy and Home excercise program 11. Overall Rehab/Functional Prognosis: excellent and good  RECOMMENDATIONS: This patient's condition is appropriate for continued rehabilitative care in the following setting: Patient states she has been to CIR before and believe she can manage at home witht he adaptive equipment and support she has.  Doing well with therapies as well.  Agree with both, recommend home with HH. Patient has agreed to participate in recommended program. Yes Note that insurance prior authorization may be required for reimbursement for recommended care.  Comment: Rehab Admissions Coordinator to follow up.   I have personally performed a face to face diagnostic evaluation, including, but not limited to relevant history and physical exam findings, of this patient and developed relevant assessment and plan.  Additionally, I have reviewed and concur with the physician assistant's documentation above.   Delice Lesch, MD, ABPMR Lavon Paganini Angiulli, PA-C 05/09/2018        Revision History                        Routing History

## 2018-05-12 NOTE — Progress Notes (Signed)
PROGRESS NOTE    [name]  HFW:[id number] DOB: 1945-10-16 DOA: 05/06/2018 PCP: Jani Gravel, MD    Brief Narrative: [name] is a 72 y.o. female who had back pain and progressive scoliosis; therefore, admitted 10/25 for surgery.  Post op remained intubated so PCCM called for assistance.  Patient was extubated.  Transferred to stepdown unit.  Hospitalist consulted for medical management.  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  Further management per neurosurgery.  Patient remains on steroids.  PT and OT following.  Plan is for rehabilitation possibly in an inpatient setting.  Acute Respiratory failure with hypoxia After surgery patient had some difficulty being extubated.  So she was admitted to the intensive care unit.  Subsequently extubated.  Respiratory status is stable.  Did experience some difficulty breathing overnight which she attributes to low blood pressure.  Lungs are clear to auscultation.  She is saturating normal.  Heart rate is normal.  EKG did not show any obvious ischemic changes.  Hypotension This was in the perioperative period.  Now resolved.  Essential hypertension  Blood pressure has been poorly controlled.  Patient noted to be on Cardizem and hydrochlorothiazide.  We will increase the dose of Cardizem to 180 mg daily.  Continue to monitor closely.  It is quite possible acute pain is also causing her blood pressure to be high.    Lower extremity pain Most likely secondary to surgery and spine issues.  No DVT noted on Doppler  study.  Constipation Continue bowel regimen.  Enema as needed.  History of A fib Stable.  Continue home medications.  Continue Eliquis.  History of first degree block  Stable  Leukocytosis Most likely due to IV steroids.  Continue to monitor.  Patient remains afebrile  Acute blood loss Anemia This was in the setting of surgery.  She was transfused 1 unit of PRBC.  Anemia panel showed a ferritin of 240.  Vitamin B12 271.  Folic acid 5.9.  Started on O27 and folic acid supplementation.  Hemoglobin remains stable.    Lactic acidosis Resolved.   Hypomagnesemia This was repleted.  Hyperglycemia This was in the setting of steroids.  Improving.   DVT prophylaxis: Apixaban Code Status: Full code Disposition Plan: Management as outlined above.  CIR recommended by physical therapy.  Consultants:   Neurosurgery is Primary.   CCM sign off 10-26   Procedures; events ETT 10/25 >> 10/26 10/25  Anterolateral decompression at L1-L5 with posterior percutaneous pedicular screw fixation from L1-S1.  Antimicrobials:   none   Subjective: Had an episode overnight where she got short of breath.  Continues to have some pain issues in her legs.  Remains anxious.    Objective: Vitals:   05/12/18 0300 05/12/18 0644 05/12/18 0801 05/12/18 1121  BP: (!) 185/82 (!) 179/79 (!) 144/65 (!) 134/55  Pulse:   76 80  Resp:   18 16  Temp: 98.2 F (36.8 C)  98 F (36.7 C) 98 F (36.7 C)  TempSrc: Oral  Oral Oral  SpO2: 99%  97% 99%  Weight:  Height:        Intake/Output Summary (Last 24 hours) at 05/12/2018 1230 Last data filed at 05/11/2018 1405 Gross per 24 hour  Intake 240 ml  Output -  Net 240 ml   Filed Weights   05/06/18 0542 05/06/18 2030  Weight: 98.4 kg 117.6 kg    Examination:  Awake alert.  In no distress. Normal effort at rest.  Lungs are clear to auscultation bilaterally.  No wheezing rales or rhonchi. S1-S2 is normal regular.  No S3-S4.  No rubs murmurs  or bruit. Abdomen is soft.  Nontender nondistended.  No masses organomegaly.  Bowel sounds are present normal. Minimal edema bilateral lower extremities. Awake alert.  Oriented x3.  No focal neurological deficits.    Data Reviewed: I have personally reviewed following labs and imaging studies  CBC: Recent Labs  Lab 05/07/18 1004 05/08/18 0823 05/09/18 0555 05/09/18 1839 05/10/18 0740 05/12/18 0722  WBC 16.1* 14.7* 12.4*  --  12.7* 12.3*  HGB 8.6* 7.9* 7.6* 9.2* 9.6* 10.5*  HCT 27.8* 25.7* 24.7* 29.6* 31.2* 32.8*  MCV 90.8 92.1 91.8  --  90.4 88.6  PLT 269 252 251  --  294 222   Basic Metabolic Panel: Recent Labs  Lab 05/06/18 2247 05/07/18 1004 05/09/18 0555 05/10/18 0740 05/11/18 0410 05/12/18 0722  NA 137 139 140 140 137 137  K 4.0 4.2 4.3 4.2 4.1 3.7  CL 106 105 112* 105 104 101  CO2 20* 22 23 26 25 28   GLUCOSE 290* 210* 166* 162* 178* 178*  BUN 15 17 18 16 15 17   CREATININE 0.89 0.89 0.78 0.73 0.68 0.69  CALCIUM 8.2* 8.5* 8.4* 8.6* 8.6* 8.7*  MG 1.6* 1.7  --   --   --  1.9  PHOS 4.0 2.6  --   --   --   --    GFR: Estimated Creatinine Clearance: 80.3 mL/min (by C-G formula based on SCr of 0.69 mg/dL).  Liver Function Tests: Recent Labs  Lab 05/06/18 2247  AST 71*  ALT 54*  ALKPHOS 79  BILITOT 1.0  PROT 5.6*  ALBUMIN 3.6   Cardiac Enzymes: Recent Labs  Lab 05/06/18 2247  CKTOTAL 827*  CKMB 17.5*   CBG: Recent Labs  Lab 05/11/18 1200 05/11/18 1734 05/11/18 2223 05/12/18 0804 05/12/18 1123  GLUCAP 135* 115* 161* 169* 201*  Sepsis Labs: Recent Labs  Lab 05/06/18 2247 05/07/18 1004 05/08/18 0823  LATICACIDVEN 4.8* 3.8* 1.8     Radiology Studies: Ct Lumbar Spine Wo Contrast  Result Date: 05/11/2018 CLINICAL DATA:  LEFT groin pain following L1 through L5 XLIF. EXAM: CT LUMBAR SPINE WITHOUT CONTRAST TECHNIQUE: Multidetector CT imaging of the lumbar spine was performed without intravenous contrast administration. Multiplanar CT image  reconstructions were also generated. COMPARISON:  Intraoperative radiograph 1025 19. Postoperative radiograph 05/09/2018. FINDINGS: Segmentation: Standard. Alignment: Improved scoliosis.  No subluxation. Vertebrae: No worrisome osseous lesion. Paraspinal and other soft tissues: Staghorn calculus LEFT kidney. No hydronephrosis. Minor amounts of methylmethacrylate in the LEFT psoas, L2, L3, and L4. Disc levels: L1-L2: XLIF. Pedicle screws and rods satisfactory position. No impingement. L2-L3: XLIF. Pedicle screws and rods satisfactory position. No impingement. L3-L4: XLIF. Pedicle screws and rods in satisfactory position. RIGHT foraminal osseous spurring, doubtful significance. L4-L5: XLIF. Pedicle screws and rods in good position. No impingement L5-S1: Advanced disc space narrowing. No intervertebral cage was placed in space the disc at this level. The LEFT S1 screw is positioned in a slightly medial location, but LEFT  S1 nerve root impingement not established. IMPRESSION: Satisfactory L1 through L5 XLIF. No adverse features of significance. Staghorn calculus LEFT kidney, and minor amounts of methylmethacrylate in the LEFT psoas, but no hematoma or paravertebral fluid collection. Electronically Signed   By: Staci Righter M.D.   On: 05/11/2018 13:05   Vas [place] Lower Extremity Venous (dvt)  Result Date: 05/11/2018  Lower Venous Study Indications: Pain.  Limitations: Body habitus and poor ultrasound/tissue interface. Performing Technologist: Oliver Hum RVT  Examination Guidelines: A complete evaluation includes B-mode imaging, spectral Doppler, color Doppler, and power Doppler as needed of all accessible portions of each vessel. Bilateral testing is considered an integral part of a complete examination. Limited examinations for reoccurring indications may be performed as noted.  Right Venous Findings: +---------+---------------+---------+-----------+----------+-------+           CompressibilityPhasicitySpontaneityPropertiesSummary +---------+---------------+---------+-----------+----------+-------+ CFV      Full           Yes      Yes                          +---------+---------------+---------+-----------+----------+-------+ SFJ      Full                                                 +---------+---------------+---------+-----------+----------+-------+ FV Prox  Full                                                 +---------+---------------+---------+-----------+----------+-------+ FV Mid   Full                                                 +---------+---------------+---------+-----------+----------+-------+ FV DistalFull                                                 +---------+---------------+---------+-----------+----------+-------+ PFV      Full                                                 +---------+---------------+---------+-----------+----------+-------+ POP      Full           Yes      Yes                          +---------+---------------+---------+-----------+----------+-------+ PTV      Full                                                 +---------+---------------+---------+-----------+----------+-------+ PERO     Full                                                 +---------+---------------+---------+-----------+----------+-------+  Left Venous Findings: +---------+---------------+---------+-----------+----------+-------+          CompressibilityPhasicitySpontaneityPropertiesSummary +---------+---------------+---------+-----------+----------+-------+ CFV      Full           Yes      Yes                          +---------+---------------+---------+-----------+----------+-------+ SFJ      Full                                                 +---------+---------------+---------+-----------+----------+-------+ FV Prox  Full                                                  +---------+---------------+---------+-----------+----------+-------+ FV Mid   Full                                                 +---------+---------------+---------+-----------+----------+-------+ FV DistalFull                                                 +---------+---------------+---------+-----------+----------+-------+ PFV      Full                                                 +---------+---------------+---------+-----------+----------+-------+ POP      Full           Yes      Yes                          +---------+---------------+---------+-----------+----------+-------+ PTV      Full                                                 +---------+---------------+---------+-----------+----------+-------+ PERO     Full                                                 +---------+---------------+---------+-----------+----------+-------+    Summary: Right: There is no evidence of deep vein thrombosis in the lower extremity. No cystic structure found in the popliteal fossa. Left: There is no evidence of deep vein thrombosis in the lower extremity. No cystic structure found in the popliteal fossa.  *See table(s) above for measurements and observations. Electronically signed by Curt Jews MD on 05/11/2018 at 3:35:12 PM.    Final       Scheduled Meds: . apixaban  5 mg Oral BID  . dexamethasone  2 mg Oral Daily  . diltiazem  180 mg Oral Daily  . docusate  sodium  100 mg Oral BID  . folic acid  1 mg Oral Daily  . gabapentin  200 mg Oral QHS  . hydrochlorothiazide  25 mg Oral Daily  . montelukast  10 mg Oral QHS  . polyethylene glycol  17 g Oral BID  . senna  1 tablet Oral BID  . sodium chloride flush  3 mL Intravenous Q12H  . vitamin B-12  500 mcg Oral Daily   Continuous Infusions: . sodium chloride    . sodium chloride Stopped (05/09/18 0807)  . methocarbamol (ROBAXIN) IV Stopped (05/07/18 2117)     LOS: 6 days     Bonnielee Haff, MD [facility] Pager [phone]  If 7PM-7AM, please contact night-coverage [email or web address] Password [email or web address] 05/12/2018, 12:30 PM

## 2018-05-12 NOTE — Progress Notes (Signed)
Occupational Therapy Evaluation Patient Details Name: [name] MRN: [id number] DOB: 02-18-1946 Today's Date: 05/12/2018    History of Present Illness pt is a 72 y/o female with h/o DDD,  bil rot cuff sx, afib, admitted with significant back and bil LE pain for degenerative scoliosis.  Pt s/p Anterolateral decompression L1-5 with fusion using XLIF and bone grafting.  Further fixated posteriorly with pedicular screws from L1-S1.   Clinical Impression   Pt seen by OT this hospital admission. Pt with apparent decline in function and decreased caregiver support and would like to participate in rehab at [facility]. Pt currently min A with LB ADL with AE; set up with UB ADL, min A with mobility @ RW level. Pt appears more anxious than last session. Encouraged pt to do more for herself in order to work on becoming more independent with use of AE. Pt has AE at home and educated on availability of AE. Will follow acutely.     Follow Up Recommendations  CIR;Supervision - Intermittent    Equipment Recommendations  None recommended by OT(pt states she does not need 3n1 )    Recommendations for Other Services Rehab consult     Precautions / Restrictions Precautions Precautions: Back;Fall Precaution Comments: pt able to state 1/3 precautions with all reviewed Required Braces or Orthoses: Spinal Brace Spinal Brace: Lumbar corset;Applied in sitting position Restrictions Weight Bearing Restrictions: No      Mobility Bed Mobility Overal bed mobility: Needs Assistance Bed Mobility: Rolling;Sidelying to Sit Rolling: Supervision Sidelying to sit: Supervision- vc on technique; no physical A needed      Transfers Overall transfer level: Needs assistance Equipment used: Rolling walker (2 wheeled) Transfers: Sit to/from Stand Sit to Stand: Min guard;Min assist  repetitive cues on correct hand placement           Balance Overall balance assessment: Needs assistance Sitting-balance support:  No upper extremity supported;Feet supported Sitting balance-Leahy Scale: Good     Standing balance support: Bilateral upper extremity supported Standing balance-Leahy Scale: Fair                             ADL either performed or assessed with clinical judgement   ADL Overall ADL's : Needs assistance/impaired Eating/Feeding: Independent   Grooming: Set up;Standing;Supervision/safety(used two cups to avoid bending over sink)   Upper Body Bathing: Set up;Sitting   Lower Body Bathing: Sit to/from stand;Moderate assistance   Upper Body Dressing : Sitting;Set up Upper Body Dressing Details (indicate cue type and reason): able to donn brace independently Lower Body Dressing: Moderate assistance;Sit to/from stand Lower Body Dressing Details (indicate cue type and reason): Pt reports he has all AE at home. Will need to bring in from home or purchase new AE Toilet Transfer: Ambulation;BSC;Min guard;Grab bars;RW(over toilet)   Toileting- Clothing Manipulation and Hygiene: Sit to/from stand;Minimal assistance;With adaptive equipment(Pt issued toilet aid last visit)     Tub/Shower Transfer Details (indicate cue type and reason): pt has a walk in shower, built in seat, grab bar, and hand held shower--do not foresee any issue with her being able to do shower at a S level with maybe A for lower legs (she spoke of getting a long handled sponge) Functional mobility during ADLs: Minimal assistance;Rolling walker;Cueing for safety General ADL Comments: Used AE PTA due to R knee surgery and back pain     Vision   Vision Assessment?: No apparent visual deficits     Perception  Praxis      Pertinent Vitals/Pain Pain Assessment: 0-10 Pain Score: 5  Pain Location: left groin; back Pain Descriptors / Indicators: Aching;Guarding Pain Intervention(s): Limited activity within patient's tolerance     Hand Dominance Right   Extremity/Trunk Assessment Upper Extremity  Assessment Upper Extremity Assessment: Overall WFL for tasks assessed   Lower Extremity Assessment Lower Extremity Assessment: Defer to PT evaluation LLE Deficits / Details: gross strength >3/5, hams 3-,  hip flexors 2+,  painful lifting against gravity more than w/bearing LLE Coordination: decreased fine motor   Cervical / Trunk Assessment Cervical / Trunk Assessment: Other exceptions(back fusion) Cervical / Trunk Exceptions: back surgery   Communication Communication Communication: No difficulties   Cognition Arousal/Alertness: Awake/alert Behavior During Therapy: Anxious Overall Cognitive Status: Within Functional Limits for tasks assessed                                     General Comments       Exercises     Shoulder Instructions      Home Living Family/patient expects to be discharged to:: Private residence Living Arrangements: Spouse/significant other Available Help at Discharge: Family;Other (Comment)(including her sister) Type of Home: House Home Access: Stairs to enter   Entrance Stairs-Rails: Right;Left Home Layout: One level     Bathroom Shower/Tub: Occupational psychologist: Handicapped height Bathroom Accessibility: Yes How Accessible: Accessible via wheelchair Home Equipment: [place] - 2 wheels;Walker - 4 wheels;Cane - single point;Grab bars - tub/shower;Grab bars - toilet;Shower seat - built in;Hand held shower head;Adaptive equipment Adaptive Equipment: Reacher;Sock aid;Long-handled shoe horn        Prior Functioning/Environment Level of Independence: Independent        Comments: very limited mobility lately        OT Problem List: Decreased strength;Decreased range of motion;Decreased knowledge of use of DME or AE;Decreased knowledge of precautions;Obesity;Pain;Decreased activity tolerance      OT Treatment/Interventions: Self-care/ADL training;DME and/or AE instruction;Therapeutic activities;Patient/family  education    OT Goals(Current goals can be found in the care plan section) Acute Rehab OT Goals Patient Stated Goal: to not be so anxious(home tomorrow) OT Goal Formulation: With patient Time For Goal Achievement: 05/26/18 Potential to Achieve Goals: Good  OT Frequency: Min 3X/week   Barriers to D/C:            Co-evaluation              AM-PAC PT "6 Clicks" Daily Activity     Outcome Measure Help from another person eating meals?: None Help from another person taking care of personal grooming?: A Little Help from another person toileting, which includes using toliet, bedpan, or urinal?: A Little Help from another person bathing (including washing, rinsing, drying)?: A Lot Help from another person to put on and taking off regular upper body clothing?: A Little Help from another person to put on and taking off regular lower body clothing?: A Lot 6 Click Score: 17   End of Session Equipment Utilized During Treatment: Gait belt;Back brace;Rolling walker Nurse Communication: Mobility status  Activity Tolerance: Patient tolerated treatment well Patient left: in chair;with call bell/phone within reach  OT Visit Diagnosis: Unsteadiness on feet (R26.81);Muscle weakness (generalized) (M62.81);Pain Pain - Right/Left: Left(groin) Pain - part of body: (groin; back)                Time: 5427-0623 OT Time Calculation (min): 24 min Charges:  OT General Charges $OT Visit: 1 Visit OT Evaluation $OT Eval Moderate Complexity: 1 Mod OT Treatments $Self Care/Home Management : 8-22 mins  Maurie Boettcher, OT/L   Acute OT Clinical Specialist Acute Rehabilitation Services Pager [phone] Office [phone]   [facility] 05/12/2018, 10:03 AM

## 2018-05-12 NOTE — Progress Notes (Signed)
Pt admitted to 4W02. oriented to floor, Oriented to call bell and no fall policy. PRN pain medication given. Pt is stable at this time.

## 2018-05-12 NOTE — Progress Notes (Signed)
PMR Admission Coordinator Pre-Admission Assessment  Patient: [name] is an 72 y.o., female MRN: [id number] DOB: Jun 30, 1946 Height: 5' 5" (165.1 cm) Weight: 117.6 kg                                                                                                                                                  Insurance Information HMO:     PPO: Yes     PCP:      IPA:      80/20:      OTHER:  PRIMARY: [employer]      Policy#: [id number]      Subscriber: Patient CM Name: Manuela Schwartz      Phone#: [phone]     Fax#: [phone] Pre-Cert#: [id number]      Employer:  Benefits:  Phone #: [phone]     Name: [employer] HTA Portal Eff. Date: 07-13-17     Deduct: $0      Out of Pocket Max: $3,400 (met [phone])      Life Max: NA CIR: $295/day for days 1-6, $0/day 7+      SNF: $20/day for days 1-20, $160/day for days 21-100 (100 day limit) Outpatient: per necessity      Co-Pay: $15/visit Home Health: per necessity      Co-Pay: 100% DME: 80%     Co-Pay: 20% Providers:  SECONDARY:       Policy#:       Subscriber:  CM Name:       Phone#:      Fax#:  Pre-Cert#:       Employer:  Benefits:  Phone #:      Name:  Eff. Date:      Deduct:       Out of Pocket Max:       Life Max:  CIR:       SNF:  Outpatient:      [place]:       Co-Pay:  DME:      Co-Pay:   Medicaid Application Date:       Case Manager:  Disability Application Date:       Case Worker:   Emergency Contact Information         Contact Information    Name Relation Home Work Mobile   Deringer,Dean Spouse [phone]       Current Medical History  Patient Admitting Diagnosis: Lumbar stenosis with radiculopathy s/p decompression History of Present Illness: Emily's Wang is a 72 year old right-handed female with history of chronic atrial fibrillation as well as first-degree AV block maintained on Eliquis, chronic diastolic congestive heart failure, prediabetes, ACDF in 2012. Per chart review and  patient, patient lives with spouse. One level home. Independent prior to admission recently using a cane. Husband works during the day. Good support of extended family. Presented 05/06/2018 with low back  pain radiating to lower extremities. X-rays and imaging revealed involvement of L1-3-4 and 4-5 degenerative scoliosis spinal stenosis with radiculopathy. Underwent decompression interbody arthrodesis and fusion 05/06/2018 per Dr. Ellene Route. Patient difficult to extubate in PACU with critical care consulted later extubated 05/07/2018 Hospital course pain management. Back brace when out of bed. Hospital course acute blood loss anemia 7.9 transfuse hemoglobin 10.5. Decadron protocol as indicated. Noted to be a bit hypotensive felt to be related to sedation and monitored. Chronic Eliquis has been resumed. Therapy evaluations completed with recommendations of physical medicine rehab consult. Patient is to be admitted for a comprehensive rehab program.  Past Medical History      Past Medical History:  Diagnosis Date  . Acute hypoxemic respiratory failure ([place]) 10/24/2016  . Arthritis    both knees  . Atrial fibrillation, chronic   . Carpal tunnel syndrome, bilateral    both hands go to sleep at times from elbows down  . Chronic diastolic heart failure ([place])   . DDD (degenerative disc disease), lumbosacral   . Dyspnea   . First degree AV block   . Hematuria 11/15/2017  . History of kidney stones   . History of rib fracture 03/2011  . Lumbar stenosis   . Obesity   . Postlaminectomy syndrome   . Pre-diabetes   . Renal calculi    BILATERAL  . Right ureteral stone   . Rotator cuff tear, left   . Rotator cuff tear, right     Family History  family history includes Diabetes in her brother, brother, and sister; Diabetes type II in her mother; Heart disease in her brother and brother; Hypertension in her brother, brother, and sister; Lung cancer in her  father.  Prior Rehab/Hospitalizations:  Has the patient had major surgery during 100 days prior to admission? No  Current Medications   Current Facility-Administered Medications:  .  0.9 %  sodium chloride infusion, 250 mL, Intravenous, Continuous, Elsner, Henry, MD .  0.9 %  sodium chloride infusion, 250 mL, Intravenous, Continuous, Desai, Rahul P, PA-C, Stopped at 05/09/18 8250 .  acetaminophen (TYLENOL) tablet 650 mg, 650 mg, Oral, Q4H PRN, 650 mg at 05/12/18 0941 **OR** acetaminophen (TYLENOL) suppository 650 mg, 650 mg, Rectal, Q4H PRN, Kristeen Miss, MD .  alum & mag hydroxide-simeth (MAALOX/MYLANTA) 200-200-20 MG/5ML suspension 30 mL, 30 mL, Oral, Q6H PRN, Kristeen Miss, MD .  apixaban (ELIQUIS) tablet 5 mg, 5 mg, Oral, BID, Kristeen Miss, MD, 5 mg at 05/12/18 0942 .  bisacodyl (DULCOLAX) suppository 10 mg, 10 mg, Rectal, Daily PRN, Kristeen Miss, MD, 10 mg at 05/08/18 1617 .  dexamethasone (DECADRON) tablet 2 mg, 2 mg, Oral, Daily, Kristeen Miss, MD, 2 mg at 05/12/18 1344 .  diltiazem (CARDIZEM CD) 24 hr capsule 180 mg, 180 mg, Oral, Daily, Bonnielee Haff, MD, 180 mg at 05/12/18 0942 .  docusate sodium (COLACE) capsule 100 mg, 100 mg, Oral, BID, Kristeen Miss, MD, 100 mg at 05/12/18 0942 .  folic acid (FOLVITE) tablet 1 mg, 1 mg, Oral, Daily, Bonnielee Haff, MD, 1 mg at 05/12/18 0943 .  gabapentin (NEURONTIN) capsule 200 mg, 200 mg, Oral, QHS, Elsner, Henry, MD .  hydrALAZINE (APRESOLINE) injection 10 mg, 10 mg, Intravenous, Q8H PRN, Regalado, Belkys A, MD, 5 mg at 05/12/18 0649 .  hydrochlorothiazide (HYDRODIURIL) tablet 25 mg, 25 mg, Oral, Daily, Regalado, Belkys A, MD, 25 mg at 05/11/18 0910 .  menthol-cetylpyridinium (CEPACOL) lozenge 3 mg, 1 lozenge, Oral, PRN **OR** phenol (CHLORASEPTIC) mouth spray 1 spray,  1 spray, Mouth/Throat, PRN, Kristeen Miss, MD, 1 spray at 05/07/18 1747 .  methocarbamol (ROBAXIN) tablet 500 mg, 500 mg, Oral, Q6H PRN, 500 mg at 05/12/18 1344 **OR**  methocarbamol (ROBAXIN) 500 mg in dextrose 5 % 50 mL IVPB, 500 mg, Intravenous, Q6H PRN, Kristeen Miss, MD, Stopped at 05/07/18 2117 .  montelukast (SINGULAIR) tablet 10 mg, 10 mg, Oral, QHS, Kristeen Miss, MD, 10 mg at 05/11/18 2121 .  morphine 2 MG/ML injection 2 mg, 2 mg, Intravenous, Q2H PRN, Kristeen Miss, MD, 1 mg at 05/11/18 2231 .  ondansetron (ZOFRAN) tablet 4 mg, 4 mg, Oral, Q6H PRN **OR** ondansetron (ZOFRAN) injection 4 mg, 4 mg, Intravenous, Q6H PRN, Kristeen Miss, MD .  oxyCODONE (Oxy IR/ROXICODONE) immediate release tablet 5 mg, 5 mg, Oral, Q3H PRN, Kristeen Miss, MD, 5 mg at 05/12/18 1344 .  polyethylene glycol (MIRALAX / GLYCOLAX) packet 17 g, 17 g, Oral, BID, Regalado, Belkys A, MD, 17 g at 05/12/18 0942 .  senna (SENOKOT) tablet 8.6 mg, 1 tablet, Oral, BID, Kristeen Miss, MD, 8.6 mg at 05/12/18 0942 .  sodium chloride flush (NS) 0.9 % injection 3 mL, 3 mL, Intravenous, Q12H, Kristeen Miss, MD, 3 mL at 05/11/18 2127 .  sodium chloride flush (NS) 0.9 % injection 3 mL, 3 mL, Intravenous, PRN, Kristeen Miss, MD .  sodium phosphate (FLEET) 7-19 GM/118ML enema 1 enema, 1 enema, Rectal, Daily PRN, Bonnielee Haff, MD .  vitamin B-12 (CYANOCOBALAMIN) tablet 500 mcg, 500 mcg, Oral, Daily, Bonnielee Haff, MD, 500 mcg at 05/12/18 0941  Patients Current Diet:     Diet Order                  Diet Heart Room service appropriate? Yes; Fluid consistency: Thin  Diet effective now               Precautions / Restrictions Precautions Precautions: Back, Fall Precaution Booklet Issued: Yes (comment) Precaution Comments: pt able to state 1/3 precautions with all reviewed Spinal Brace: Lumbar corset, Applied in sitting position Restrictions Weight Bearing Restrictions: No   Has the patient had 2 or more falls or a fall with injury in the past year?No  Prior Activity Level Community (5-7x/wk): active; retired; worked Engineer, production; drove Radiographer, therapeutic /  Paramedic Devices/Equipment: Radio producer (specify quad or straight), Environmental consultant (specify type) Home Equipment: Environmental consultant - 2 wheels, Environmental consultant - 4 wheels, [place] - single point, Grab bars - tub/shower, Grab bars - toilet, Shower seat - built in, [employer] held shower head, Adaptive equipment  Prior Device Use: Indicate devices/aids used by the patient prior to current illness, exacerbation or injury? single point cane  Prior Functional Level Prior Function Level of Independence: Independent Comments: very limited mobility lately  Self Care: Did the patient need help bathing, dressing, using the toilet or eating?  Independent  Indoor Mobility: Did the patient need assistance with walking from room to room (with or without device)? Independent  Stairs: Did the patient need assistance with internal or external stairs (with or without device)? Independent  Functional Cognition: Did the patient need help planning regular tasks such as shopping or remembering to take medications? Independent  Current Functional Level Cognition  Overall Cognitive Status: Within Functional Limits for tasks assessed Orientation Level: Oriented X4 General Comments: pt very anxious regarding pain and not being able to go home because her husband cant care for her because he needs knee surgery    Extremity Assessment (includes Sensation/Coordination)  Upper Extremity Assessment:  Overall WFL for tasks assessed  Lower Extremity Assessment: Defer to PT evaluation LLE Deficits / Details: gross strength >3/5, hams 3-,  hip flexors 2+,  painful lifting against gravity more than w/bearing LLE Coordination: decreased fine motor    ADLs  Overall ADL's : Needs assistance/impaired Eating/Feeding: Independent Grooming: Set up, Standing, Supervision/safety(used two cups to avoid bending over sink) Upper Body Bathing: Set up, Sitting Lower Body Bathing: Sit to/from stand, Moderate assistance Upper Body Dressing :  Sitting, Set up Upper Body Dressing Details (indicate cue type and reason): able to donn brace independently Lower Body Dressing: Moderate assistance, Sit to/from stand Lower Body Dressing Details (indicate cue type and reason): Pt reports he has all AE at home. Will need to bring in from home or purchase new AE Toilet Transfer: Ambulation, BSC, Min guard, Grab bars, RW(over toilet) Toileting- Clothing Manipulation and Hygiene: Sit to/from stand, Minimal assistance, With adaptive equipment(Pt issued toilet aid last visit) Toileting - Clothing Manipulation Details (indicate cue type and reason): Pt can adjust her own clothing, but needs A for toileting hygiene--I showed her how a toilet aid works and gave her one to take home Tub/Shower Transfer Details (indicate cue type and reason): pt has a walk in shower, built in seat, grab bar, and hand held shower--do not foresee any issue with her being able to do shower at a S level with maybe A for lower legs (she spoke of getting a long handled sponge) Functional mobility during ADLs: Minimal assistance, Rolling walker, Cueing for safety General ADL Comments: Used AE PTA due to R knee surgery and back pain    Mobility  Overal bed mobility: Needs Assistance Bed Mobility: Rolling, Sidelying to Sit Rolling: Supervision Sidelying to sit: Supervision Sit to sidelying: Min assist General bed mobility comments: cues for sequence particularly not to twist with rolling and positioning in bed, min assist to bring LLE off of and onto bed with mobility    Transfers  Overall transfer level: Needs assistance Equipment used: Rolling walker (2 wheeled) Transfers: Sit to/from Stand Sit to Stand: Min guard, Min assist General transfer comment: min assist to rise from bed, minguard to rise from [facility] over toilet, cues for hand placement and posture    Ambulation / Gait / Stairs / Wheelchair Mobility  Ambulation/Gait Ambulation/Gait assistance: Catering manager (Feet): 100 Feet Assistive device: Rolling walker (2 wheeled) Gait Pattern/deviations: Step-through pattern, Decreased stride length General Gait Details: slow and guarded gait limited by pain Gait velocity: slow Gait velocity interpretation: 1.31 - 2.62 ft/sec, indicative of limited community ambulator Stairs: Yes Stairs assistance: Min assist Stair Management: Step to pattern, Forwards, One rail Left Number of Stairs: 1 General stair comments: one step with rail and HHA with cues for sequence    Posture / Balance Balance Overall balance assessment: Needs assistance Sitting-balance support: No upper extremity supported, Feet supported Sitting balance-Leahy Scale: Good Standing balance support: Bilateral upper extremity supported Standing balance-Leahy Scale: Fair Standing balance comment: reliance on the RW and external support    Special needs/care consideration BiPAP/CPAP: no CPM: no Continuous Drip IV: no Dialysis: no        Days: no Life Vest: no Oxygen: no Special Bed: no Trach Size: no Wound Vac (area): no      Location: no Skin: right flank incision; back incision; ecchymosis on Left anterior arm, flank and lumbar region   Bowel mgmt:last charted BM 05/11/18 (pt reports a BM: 05/12/18 as well) Bladder mgmt: continent Diabetic mgmt:  no     Previous Home Environment Living Arrangements: Spouse/significant other Available Help at Discharge: Family, Other (Comment)(including her sister) Type of Home: House Home Layout: One level Home Access: Stairs to enter Entrance Stairs-Rails: Right, Left Bathroom Shower/Tub: Multimedia programmer: Handicapped height Bathroom Accessibility: Yes How Accessible: Accessible via wheelchair [place]: No  Discharge Living Setting Plans for Discharge Living Setting: Patient's home, Lives with (comment)(lives with husband) Type of Home at Discharge: House Discharge Home Layout: One level Discharge  Home Access: Stairs to enter Entrance Stairs-Rails: None Entrance Stairs-Number of Steps: one step Discharge Bathroom Shower/Tub: Walk-in shower Discharge Bathroom Toilet: Handicapped height Discharge Bathroom Accessibility: Yes How Accessible: Accessible via walker Does the patient have any problems obtaining your medications?: No  Social/Family/Support Systems Patient Roles: Spouse Contact Information: husband is emergency contact Anticipated Caregiver: [name] (husband) [phone] Anticipated Caregiver's Contact Information: see above Ability/Limitations of Caregiver: supervision  Caregiver Availability: 24/7 Discharge Plan Discussed with Primary Caregiver: Yes Is Caregiver In Agreement with Plan?: Yes Does Caregiver/Family have Issues with Lodging/Transportation while Pt is in Rehab?: No   Goals/Additional Needs Patient/Family Goal for Rehab: PT/OT: Supervision; SLP: NA Expected length of stay: 5-7 days Cultural Considerations: [employer] Dietary Needs: heart healthy, thin liquids Equipment Needs: TBD Pt/Family Agrees to Admission and willing to participate: Yes Program Orientation Provided & Reviewed with Pt/Caregiver Including Roles  & Responsibilities: Yes(reviewed with husband)  Barriers to Discharge: Inaccessible home environment, Decreased caregiver support  Barriers to Discharge Comments: 1 step to enter; pain, husband only able to provide supervision currently due to own medical issues   Decrease burden of Care through IP rehab admission: NA   Possible need for SNF placement upon discharge: Not anticipated; pt has good assist from family at DC and has good prognosis for further progress   Patient Condition: This patient's medical and functional status has changed since the consult dated 05/09/18 in which the Rehabilitation Physician documented that the patient was not appropriate for intensive rehabilitative care in an inpatient rehabilitation facility. Due to  change in status and issues being addressed, patient's case has been discussed with Dr. Letta Pate  as (well as Dr. Posey Pronto, who performed initial consult) and patient now appropriate for inpatient rehabilitation. Will admit to inpatient rehab today.  Preadmission Screen Completed By:  Jhonnie Garner, 05/12/2018 4:42 PM ______________________________________________________________________   Discussed status with Dr. Letta Pate on 05/12/18 at 4:42PM and received telephone approval for admission today.  Admission Coordinator:  Jhonnie Garner, time 4:42PM/Date 05/12/18           Cosigned by: Charlett Blake, MD at 05/12/2018 4:44 PM  Revision History

## 2018-05-12 NOTE — H&P (Signed)
Physical Medicine and Rehabilitation Admission H&P       HPI: [name] is a 72 year old right-handed female with history of chronic atrial fibrillation as well as first-degree AV block maintained on Eliquis, chronic diastolic congestive heart failure, prediabetes, ACDF in 2012.  Per chart review and patient, patient lives with spouse.  One level home.  Independent prior to admission recently using a cane.  Husband works during the day.  Good support of extended family.  Presented 05/06/2018 with low back pain radiating to lower extremities.  X-rays and imaging revealed involvement of L1-3-4 and 4-5 degenerative scoliosis spinal stenosis with radiculopathy.  Underwent decompression interbody arthrodesis and fusion 05/06/2018 per Dr. Ellene Route.  Patient difficult to extubate in PACU with critical care consulted later extubated 05/07/2018 Hospital course pain management.  Back brace when out of bed.  Hospital course acute blood loss anemia 7.9 transfuse hemoglobin 10.5.  Decadron protocol as indicated.  Noted to be a bit hypotensive felt to be related to sedation and monitored.  Chronic Eliquis has been resumed.  Therapy evaluations completed with recommendations of physical medicine rehab consult.  Patient was admitted for a comprehensive rehab program.  Pt states she cannot raise R arm overhead due to old rotator cuff injury   Review of Systems  Constitutional: Negative for chills and fever.  HENT: Negative for hearing loss.   Eyes: Negative for blurred vision and double vision.  Respiratory: Negative for cough and shortness of breath.   Cardiovascular: Positive for palpitations and leg swelling.  Gastrointestinal: Positive for constipation. Negative for nausea and vomiting.  Genitourinary: Negative for dysuria, flank pain and hematuria.  Musculoskeletal: Positive for back pain.  Skin: Negative for rash.  Neurological: Positive for weakness.  All other systems reviewed and are negative.     Past Medical History:  Diagnosis Date  . Acute hypoxemic respiratory failure ([place]) 10/24/2016  . Arthritis      both knees  . Atrial fibrillation, chronic    . Carpal tunnel syndrome, bilateral      both hands go to sleep at times from elbows down  . Chronic diastolic heart failure ([place])    . DDD (degenerative disc disease), lumbosacral    . Dyspnea    . First degree AV block    . Hematuria 11/15/2017  . History of kidney stones    . History of rib fracture 03/2011  . Lumbar stenosis    . Obesity    . Postlaminectomy syndrome    . Pre-diabetes    . Renal calculi      BILATERAL  . Right ureteral stone    . Rotator cuff tear, left    . Rotator cuff tear, right           Past Surgical History:  Procedure Laterality Date  . ANTERIOR CERVICAL DECOMP/DISCECTOMY FUSION   03-18-2011    C3 -- C7  . ANTERIOR LATERAL LUMBAR FUSION 4 LEVELS N/A 05/06/2018    Procedure: Lumbar one- two Lumbar two -three Lumbar  three-four Lumbar four-five Anterolateral decompression/fusion/posterior bilateral laminectomy Lumbar four-five Lumbar five Sacral one/posterior percutaneous fixation/mazor;  Surgeon: Kristeen Miss, MD;  Location: [facility];  Service: Neurosurgery;  Laterality: N/A;  . APPENDECTOMY      . APPLICATION OF ROBOTIC ASSISTANCE FOR SPINAL PROCEDURE N/A 05/06/2018    Procedure: APPLICATION OF ROBOTIC ASSISTANCE FOR SPINAL PROCEDURE;  Surgeon: Kristeen Miss, MD;  Location: [facility];  Service: Neurosurgery;  Laterality: N/A;  . BILATERAL CARPAL TUNNEL RELEASE  3 screws in right hand, thumb, middle finger, and index finger,   . CARDIAC CATHETERIZATION N/A 01/16/2015    Procedure: Left Heart Cath and Coronary Angiography;  Surgeon: Belva Crome, MD;  Location: [facility];  Service: Cardiovascular;  Laterality: N/A;  . CHOLECYSTECTOMY OPEN   1970'S  . COLONOSCOPY   2018  . CYSTOSCOPY WITH RETROGRADE PYELOGRAM, URETEROSCOPY AND STENT PLACEMENT N/A 02/13/2013    Procedure: CYSTOSCOPY WITH  RETROGRADE PYELOGRAM, URETEROSCOPY AND LITHOTRIPSY ;  Surgeon: Claybon Jabs, MD;  Location: [facility];  Service: Urology;  Laterality: N/A;  . CYSTOSCOPY/URETEROSCOPY/HOLMIUM LASER/STENT PLACEMENT Right 11/22/2017    Procedure: CYSTOSCOPY/URETEROSCOPY/RETROGRADE PYELOGRAM/HOLMIUM LASER/STENT PLACEMENT;  Surgeon: Kathie Rhodes, MD;  Location: [facility];  Service: Urology;  Laterality: Right;  . EXTRACORPOREAL SHOCK WAVE LITHOTRIPSY Right 05-16-2012  . FINGER ARTHROSCOPY WITH CARPOMETACARPEL (CMC) ARTHROPLASTY      . HOLMIUM LASER APPLICATION Right 5/0/3546    Procedure: HOLMIUM LASER APPLICATION;  Surgeon: Claybon Jabs, MD;  Location: [facility];  Service: Urology;  Laterality: Right;  . KNEE ARTHROSCOPY Bilateral    . LUMBAR LAMINECTOMY   X2  1960'S  . LUMBAR PERCUTANEOUS PEDICLE SCREW 4 LEVEL N/A 05/06/2018    Procedure: LUMBAR PERCUTANEOUS PEDICLE SCREW LUMBAR ONE TO LUMBAR FIVE WITH MAZOR ;POSTERIOR LATERAL LAMINECTOMY LUMBAR FOUR-FIVE,FIVE-SACRAL ONE;  Surgeon: Kristeen Miss, MD;  Location: [facility];  Service: Neurosurgery;  Laterality: N/A;  . NASAL SEPTUM SURGERY   1970'S  . SHOULDER OPEN ROTATOR CUFF REPAIR Bilateral LEFT 01-11-2001/   RIGHT 07-21-2002  . TOTAL KNEE ARTHROPLASTY   04/13/2012    Procedure: TOTAL KNEE ARTHROPLASTY;  Surgeon: Gearlean Alf, MD;  Location: [facility];  Service: Orthopedics;  Laterality: Left;  . TOTAL KNEE ARTHROPLASTY   08/08/2012    Procedure: TOTAL KNEE ARTHROPLASTY;  Surgeon: Gearlean Alf, MD;  Location: [facility];  Service: Orthopedics;  Laterality: Right;  . TRANSTHORACIC ECHOCARDIOGRAM   03-29-2011    MODERATE LVH/ EF 60-65%/ MILD MR  . VAGINAL HYSTERECTOMY   1960'S         Family History  Problem Relation Age of Onset  . Diabetes type II Mother    . Diabetes Sister    . Hypertension Sister    . Lung cancer Father    . Hypertension Brother    . Diabetes Brother    . Heart disease Brother    .  Diabetes Brother    . Hypertension Brother    . Heart disease Brother      Social History:  reports that she has never smoked. She has never used smokeless tobacco. She reports that she does not drink alcohol or use drugs. Allergies:       Allergies  Allergen Reactions  . Tape Swelling      Adhesive Tape          Medications Prior to Admission  Medication Sig Dispense Refill  . acetaminophen (TYLENOL) 325 MG tablet Take 325-650 mg by mouth every 6 (six) hours as needed for mild pain.       Marland Kitchen apixaban (ELIQUIS) 5 MG TABS tablet Take 1 tablet (5 mg total) by mouth 2 (two) times daily. 180 tablet 3  . diclofenac sodium (VOLTAREN) 1 % GEL Apply 2 g topically 3 (three) times daily as needed (back pain).       Marland Kitchen diltiazem (CARTIA XT) 120 MG 24 hr capsule Take 1 capsule (120 mg total) by mouth daily. [place]  capsule 3  . montelukast (SINGULAIR) 10 MG tablet Take 10 mg by mouth at bedtime.          Drug Regimen Review Drug regimen was reviewed and remains appropriate with no significant issues identified   Home: Home Living Family/patient expects to be discharged to:: Private residence Living Arrangements: Spouse/significant other Available Help at Discharge: Family, Other (Comment)(including her sister) Type of Home: House Home Access: Stairs to enter Entrance Stairs-Rails: Right, Left Home Layout: One level Bathroom Shower/Tub: Multimedia programmer: Handicapped height Bathroom Accessibility: Yes Home Equipment: Environmental consultant - 2 wheels, Environmental consultant - 4 wheels, [place] - single point, Grab bars - tub/shower, Grab bars - toilet, Shower seat - built in, [employer] held shower head, Financial controller: Reacher, Sock aid, Long-handled shoe horn   Functional History: Prior Function Level of Independence: Independent Comments: very limited mobility lately   Functional Status:  Mobility: Bed Mobility Overal bed mobility: Needs Assistance Bed Mobility: Rolling, Sidelying to  Sit Rolling: Supervision Sidelying to sit: Supervision Sit to sidelying: Min assist General bed mobility comments: cues for sequence particularly not to twist with rolling and positioning in bed, min assist to bring LLE off of and onto bed with mobility Transfers Overall transfer level: Needs assistance Equipment used: Rolling walker (2 wheeled) Transfers: Sit to/from Stand Sit to Stand: Min guard, Min assist General transfer comment: min assist to rise from bed, minguard to rise from [facility] over toilet, cues for hand placement and posture Ambulation/Gait Ambulation/Gait assistance: Min guard Gait Distance (Feet): 100 Feet Assistive device: Rolling walker (2 wheeled) Gait Pattern/deviations: Step-through pattern, Decreased stride length General Gait Details: slow and guarded gait limited by pain Gait velocity: slow Gait velocity interpretation: 1.31 - 2.62 ft/sec, indicative of limited community ambulator Stairs: Yes Stairs assistance: Min assist Stair Management: Step to pattern, Forwards, One rail Left Number of Stairs: 1 General stair comments: one step with rail and HHA with cues for sequence   ADL: ADL Overall ADL's : Needs assistance/impaired Eating/Feeding: Independent Grooming: Set up, Standing, Supervision/safety(used two cups to avoid bending over sink) Upper Body Bathing: Set up, Sitting Lower Body Bathing: Sit to/from stand, Moderate assistance Upper Body Dressing : Sitting, Set up Upper Body Dressing Details (indicate cue type and reason): able to donn brace independently Lower Body Dressing: Moderate assistance, Sit to/from stand Lower Body Dressing Details (indicate cue type and reason): Pt reports he has all AE at home. Will need to bring in from home or purchase new AE Toilet Transfer: Ambulation, BSC, Min guard, Grab bars, RW(over toilet) Toileting- Clothing Manipulation and Hygiene: Sit to/from stand, Minimal assistance, With adaptive equipment(Pt issued toilet  aid last visit) Toileting - Clothing Manipulation Details (indicate cue type and reason): Pt can adjust her own clothing, but needs A for toileting hygiene--I showed her how a toilet aid works and gave her one to take home Tub/Shower Transfer Details (indicate cue type and reason): pt has a walk in shower, built in seat, grab bar, and hand held shower--do not foresee any issue with her being able to do shower at a S level with maybe A for lower legs (she spoke of getting a long handled sponge) Functional mobility during ADLs: Minimal assistance, Rolling walker, Cueing for safety General ADL Comments: Used AE PTA due to R knee surgery and back pain   Cognition: Cognition Overall Cognitive Status: Within Functional Limits for tasks assessed Orientation Level: Oriented X4 Cognition Arousal/Alertness: Awake/alert Behavior During Therapy: Anxious Overall Cognitive Status: Within Functional  Limits for tasks assessed General Comments: pt very anxious regarding pain and not being able to go home because her husband cant care for her because he needs knee surgery   Physical Exam: Blood pressure (!) 134/55, pulse 80, temperature 98 F (36.7 C), temperature source Oral, resp. rate 16, height '5\' 5"'$  (1.651 m), weight 117.6 kg, SpO2 99 %. Physical Exam  Vitals reviewed. Constitutional: She is oriented to person, place, and time.  HENT:  Head: Normocephalic.  Eyes: EOM are normal. Right eye exhibits no discharge. Left eye exhibits no discharge.  Neck: Normal range of motion. Neck supple. No thyromegaly present.  Cardiovascular:  Cardiac rate controlled Irreg, Irreg Respiratory: Effort normal and breath sounds normal. No respiratory distress.  GI: Soft. Bowel sounds are normal. She exhibits no distension.  Neurological: She is alert and oriented to person, place, and time.  Skin:  Back incision dressed   Motor 3- Right deltoid 5/5 R Biceps Triceps grip, 5/5 Left Delt, Biceps,Triceps , grip 2-/5 R  Hip flex, Kee ext, trace ankle DF 3- Left HF, KE, ankle PF/DF Sensation reduced bilateral feet up to knees  Right shoulder reduced active ROM Lab Results Last 48 Hours        Results for orders placed or performed during the hospital encounter of 05/06/18 (from the past 48 hour(s))  Glucose, capillary     Status: Abnormal    Collection Time: 05/10/18  5:09 PM  Result Value Ref Range    Glucose-Capillary 128 (H) 70 - 99 mg/dL  Glucose, capillary     Status: Abnormal    Collection Time: 05/10/18  9:48 PM  Result Value Ref Range    Glucose-Capillary 122 (H) 70 - 99 mg/dL  Basic metabolic panel     Status: Abnormal    Collection Time: 05/11/18  4:10 AM  Result Value Ref Range    Sodium 137 135 - 145 mmol/L    Potassium 4.1 3.5 - 5.1 mmol/L    Chloride 104 98 - 111 mmol/L    CO2 25 22 - 32 mmol/L    Glucose, Bld 178 (H) 70 - 99 mg/dL    BUN 15 8 - 23 mg/dL    Creatinine, Ser 0.68 0.44 - 1.00 mg/dL    Calcium 8.6 (L) 8.9 - 10.3 mg/dL    GFR calc non Af Amer >60 >60 mL/min    GFR calc Af Amer >60 >60 mL/min      Comment: (NOTE) The eGFR has been calculated using the CKD EPI equation. This calculation has not been validated in all clinical situations. eGFR's persistently <60 mL/min signify possible Chronic Kidney Disease.      Anion gap 8 5 - 15      Comment: Performed at [facility] [address], [place], [place] [address]  Glucose, capillary     Status: Abnormal    Collection Time: 05/11/18  7:59 AM  Result Value Ref Range    Glucose-Capillary 154 (H) 70 - 99 mg/dL  Glucose, capillary     Status: Abnormal    Collection Time: 05/11/18 12:00 PM  Result Value Ref Range    Glucose-Capillary 135 (H) 70 - 99 mg/dL  Glucose, capillary     Status: Abnormal    Collection Time: 05/11/18  5:34 PM  Result Value Ref Range    Glucose-Capillary 115 (H) 70 - 99 mg/dL  Glucose, capillary     Status: Abnormal    Collection Time: 05/11/18 10:23 PM  Result Value  Ref Range     Glucose-Capillary 161 (H) 70 - 99 mg/dL  CBC     Status: Abnormal    Collection Time: 05/12/18  7:22 AM  Result Value Ref Range    WBC 12.3 (H) 4.0 - 10.5 K/uL    RBC 3.70 (L) 3.87 - 5.11 MIL/uL    Hemoglobin 10.5 (L) 12.0 - 15.0 g/dL    HCT 32.8 (L) 36.0 - 46.0 %    MCV 88.6 80.0 - 100.0 fL    MCH 28.4 26.0 - 34.0 pg    MCHC 32.0 30.0 - 36.0 g/dL    RDW 14.4 11.5 - 15.5 %    Platelets 351 150 - 400 K/uL    nRBC 0.0 0.0 - 0.2 %      Comment: Performed at [facility], [place] [address], [place], [place] [address]  Basic metabolic panel     Status: Abnormal    Collection Time: 05/12/18  7:22 AM  Result Value Ref Range    Sodium 137 135 - 145 mmol/L    Potassium 3.7 3.5 - 5.1 mmol/L    Chloride 101 98 - 111 mmol/L    CO2 28 22 - 32 mmol/L    Glucose, Bld 178 (H) 70 - 99 mg/dL    BUN 17 8 - 23 mg/dL    Creatinine, Ser 0.69 0.44 - 1.00 mg/dL    Calcium 8.7 (L) 8.9 - 10.3 mg/dL    GFR calc non Af Amer >60 >60 mL/min    GFR calc Af Amer >60 >60 mL/min      Comment: (NOTE) The eGFR has been calculated using the CKD EPI equation. This calculation has not been validated in all clinical situations. eGFR's persistently <60 mL/min signify possible Chronic Kidney Disease.      Anion gap 8 5 - 15      Comment: Performed at [facility] [address], [place], [place] [address]  Magnesium     Status: None    Collection Time: 05/12/18  7:22 AM  Result Value Ref Range    Magnesium 1.9 1.7 - 2.4 mg/dL      Comment: Performed at [facility] [address] [place], [place] [address]  Glucose, capillary     Status: Abnormal    Collection Time: 05/12/18  8:04 AM  Result Value Ref Range    Glucose-Capillary 169 (H) 70 - 99 mg/dL  Glucose, capillary     Status: Abnormal    Collection Time: 05/12/18 11:23 AM  Result Value Ref Range    Glucose-Capillary 201 (H) 70 - 99 mg/dL       Imaging Results (Last 48 hours)  Ct Lumbar Spine Wo Contrast   Result Date:  05/11/2018 CLINICAL DATA:  LEFT groin pain following L1 through L5 XLIF. EXAM: CT LUMBAR SPINE WITHOUT CONTRAST TECHNIQUE: Multidetector CT imaging of the lumbar spine was performed without intravenous contrast administration. Multiplanar CT image reconstructions were also generated. COMPARISON:  Intraoperative radiograph 1025 19. Postoperative radiograph 05/09/2018. FINDINGS: Segmentation: Standard. Alignment: Improved scoliosis.  No subluxation. Vertebrae: No worrisome osseous lesion. Paraspinal and other soft tissues: Staghorn calculus LEFT kidney. No hydronephrosis. Minor amounts of methylmethacrylate in the LEFT psoas, L2, L3, and L4. Disc levels: L1-L2: XLIF. Pedicle screws and rods satisfactory position. No impingement. L2-L3: XLIF. Pedicle screws and rods satisfactory position. No impingement. L3-L4: XLIF. Pedicle screws and rods in satisfactory position. RIGHT foraminal osseous spurring, doubtful significance. L4-L5: XLIF. Pedicle screws and rods  in good position. No impingement L5-S1: Advanced disc space narrowing. No intervertebral cage was placed in space the disc at this level. The LEFT S1 screw is positioned in a slightly medial location, but LEFT S1 nerve root impingement not established. IMPRESSION: Satisfactory L1 through L5 XLIF. No adverse features of significance. Staghorn calculus LEFT kidney, and minor amounts of methylmethacrylate in the LEFT psoas, but no hematoma or paravertebral fluid collection. Electronically Signed   By: Staci Righter M.D.   On: 05/11/2018 13:05    Vas [place] Lower Extremity Venous (dvt)   Result Date: 05/11/2018  Lower Venous Study Indications: Pain.  Limitations: Body habitus and poor ultrasound/tissue interface. Performing Technologist: Oliver Hum RVT  Examination Guidelines: A complete evaluation includes B-mode imaging, spectral Doppler, color Doppler, and power Doppler as needed of all accessible portions of each vessel. Bilateral testing is considered an  integral part of a complete examination. Limited examinations for reoccurring indications may be performed as noted.  Right Venous Findings: +---------+---------------+---------+-----------+----------+-------+          CompressibilityPhasicitySpontaneityPropertiesSummary +---------+---------------+---------+-----------+----------+-------+ CFV      Full           Yes      Yes                          +---------+---------------+---------+-----------+----------+-------+ SFJ      Full                                                 +---------+---------------+---------+-----------+----------+-------+ FV Prox  Full                                                 +---------+---------------+---------+-----------+----------+-------+ FV Mid   Full                                                 +---------+---------------+---------+-----------+----------+-------+ FV DistalFull                                                 +---------+---------------+---------+-----------+----------+-------+ PFV      Full                                                 +---------+---------------+---------+-----------+----------+-------+ POP      Full           Yes      Yes                          +---------+---------------+---------+-----------+----------+-------+ PTV      Full                                                 +---------+---------------+---------+-----------+----------+-------+  PERO     Full                                                 +---------+---------------+---------+-----------+----------+-------+  Left Venous Findings: +---------+---------------+---------+-----------+----------+-------+          CompressibilityPhasicitySpontaneityPropertiesSummary +---------+---------------+---------+-----------+----------+-------+ CFV      Full           Yes      Yes                           +---------+---------------+---------+-----------+----------+-------+ SFJ      Full                                                 +---------+---------------+---------+-----------+----------+-------+ FV Prox  Full                                                 +---------+---------------+---------+-----------+----------+-------+ FV Mid   Full                                                 +---------+---------------+---------+-----------+----------+-------+ FV DistalFull                                                 +---------+---------------+---------+-----------+----------+-------+ PFV      Full                                                 +---------+---------------+---------+-----------+----------+-------+ POP      Full           Yes      Yes                          +---------+---------------+---------+-----------+----------+-------+ PTV      Full                                                 +---------+---------------+---------+-----------+----------+-------+ PERO     Full                                                 +---------+---------------+---------+-----------+----------+-------+    Summary: Right: There is no evidence of deep vein thrombosis in the lower extremity. No cystic structure found in the popliteal fossa. Left: There is no evidence of deep vein thrombosis in the lower extremity. No cystic structure found in the popliteal fossa.  *  See table(s) above for measurements and observations. Electronically signed by Curt Jews MD on 05/11/2018 at 3:35:12 PM.    Final              Medical Problem List and Plan: 1.  Decreased functional mobility secondary to lumbar stenosis with radiculopathy status post decompression with fusion L1-2, 3- 4 and 4 -5 05/06/2018.  Back brace when out of bed 2.  DVT Prophylaxis/Anticoagulation: Eliquis 3. Pain Management: Neurontin 200 mg nightly, Robaxin and oxycodone as needed 4. Mood: Provide  emotional support 5. Neuropsych: This patient is capable of making decisions on her own behalf. 6. Skin/Wound Care: Routine skin checks 7. Fluids/Electrolytes/Nutrition: Routine in and outs with follow-up chemistries 8.  Acute blood loss anemia.  Follow-up CBC 9.  Hypertension.  Monitor with increased mobility.  Continue HCTZ 25 mg daily 10.  Atrial fibrillation.  Cardiac rate controlled.  Cardizem 180 mg daily 11.  Chronic diastolic congestive heart failure.  Monitor for any signs of fluid overload 12.  Constipation.  Laxative assistance   Post Admission Physician Evaluation: 1. Functional deficits secondary  to paraparesis secondary to spinal stenosis. 2. Patient admitted to receive collaborative, interdisciplinary care between the physiatrist, rehab nursing staff, and therapy team. 3. Patient's level of medical complexity and substantial therapy needs in context of that medical necessity cannot be provided at a lesser intensity of care. 4. Patient has experienced substantial functional loss from his/her baseline.Judging by the patient's diagnosis, physical exam, and functional history, the patient has potential for functional progress which will result in measurable gains while on inpatient rehab.  These gains will be of substantial and practical use upon discharge in facilitating mobility and self-care at the household level. 5. Physiatrist will provide 24 hour management of medical needs as well as oversight of the therapy plan/treatment and provide guidance as appropriate regarding the interaction of the two. 6. 24 hour rehab nursing will assist in the management of  bladder management, bowel management, safety, skin/wound care, disease management, medication administration, pain management and patient education  and help integrate therapy concepts, techniques,education, etc. 7. PT will assess and treat for:pre gait, gait training, endurance , safety, equipment, neuromuscular re education  .   Goals are: independent with assistive device. 8. OT will assess and treat for ADLs, Cognitive perceptual skills, Neuromuscular re education, safety, endurance, equipment  .  Goals are: supervision.  9. SLP will assess and treat for  .  Goals are: N/A. 10. Case Management and Social Worker will assess and treat for psychological issues and discharge planning. 11. Team conference will be held weekly to assess progress toward goals and to determine barriers to discharge. 12.  Patient will receive at least 3 hours of therapy per day at least 5 days per week. 13. ELOS and Prognosis: 7-10d  excellent  "I have personally performed a face to face diagnostic evaluation of this patient.  Additionally, I have reviewed and concur with the physician assistant's documentation above."  Charlett Blake M.D. [facility] FAAPM&R (Sports Med, Neuromuscular Med) Diplomate Am Board of Electrodiagnostic Med     Cathlyn Parsons, PA-C 05/12/2018

## 2018-05-13 ENCOUNTER — Inpatient Hospital Stay (HOSPITAL_COMMUNITY): Payer: PPO | Admitting: Physical Therapy

## 2018-05-13 ENCOUNTER — Inpatient Hospital Stay (HOSPITAL_COMMUNITY): Payer: PPO

## 2018-05-13 DIAGNOSIS — I4819 Other persistent atrial fibrillation: Secondary | ICD-10-CM

## 2018-05-13 DIAGNOSIS — A499 Bacterial infection, unspecified: Secondary | ICD-10-CM

## 2018-05-13 DIAGNOSIS — N390 Urinary tract infection, site not specified: Secondary | ICD-10-CM

## 2018-05-13 LAB — COMPREHENSIVE METABOLIC PANEL
ALBUMIN: 3.3 g/dL — AB (ref 3.5–5.0)
ALK PHOS: 69 U/L (ref 38–126)
ALT: 25 U/L (ref 0–44)
AST: 16 U/L (ref 15–41)
Anion gap: 7 (ref 5–15)
BUN: 22 mg/dL (ref 8–23)
CALCIUM: 8.4 mg/dL — AB (ref 8.9–10.3)
CO2: 27 mmol/L (ref 22–32)
CREATININE: 0.72 mg/dL (ref 0.44–1.00)
Chloride: 104 mmol/L (ref 98–111)
GFR calc Af Amer: >60 mL/min (ref >60)
GFR calc non Af Amer: >60 mL/min (ref >60)
GLUCOSE: 125 mg/dL — AB (ref 70–99)
Potassium: 3.9 mmol/L (ref 3.5–5.1)
Sodium: 138 mmol/L (ref 135–145)
TOTAL PROTEIN: 5.6 g/dL — AB (ref 6.5–8.1)
Total Bilirubin: 1.5 mg/dL — ABNORMAL HIGH (ref 0.3–1.2)

## 2018-05-13 LAB — CBC WITH DIFFERENTIAL/PLATELET
ABS IMMATURE GRANULOCYTES: 0.74 10*3/uL — AB (ref 0.00–0.07)
BASOS ABS: 0.1 10*3/uL (ref 0.0–0.1)
Basophils Relative: 1 %
Eosinophils Absolute: 0 10*3/uL (ref 0.0–0.5)
Eosinophils Relative: 0 %
HEMATOCRIT: 32.5 % — AB (ref 36.0–46.0)
HEMOGLOBIN: 10 g/dL — AB (ref 12.0–15.0)
IMMATURE GRANULOCYTES: 5 %
LYMPHS ABS: 2.7 10*3/uL (ref 0.7–4.0)
LYMPHS PCT: 18 %
MCH: 27.8 pg (ref 26.0–34.0)
MCHC: 30.8 g/dL (ref 30.0–36.0)
MCV: 90.3 fL (ref 80.0–100.0)
Monocytes Absolute: 1.2 10*3/uL — ABNORMAL HIGH (ref 0.1–1.0)
Monocytes Relative: 9 %
NRBC: 0 % (ref 0.0–0.2)
Neutro Abs: 9.7 10*3/uL — ABNORMAL HIGH (ref 1.7–7.7)
Neutrophils Relative %: 67 %
Platelets: 384 10*3/uL (ref 150–400)
RBC: 3.6 MIL/uL — ABNORMAL LOW (ref 3.87–5.11)
RDW: 14.9 % (ref 11.5–15.5)
WBC: 14.4 10*3/uL — ABNORMAL HIGH (ref 4.0–10.5)

## 2018-05-13 LAB — URINALYSIS, COMPLETE (UACMP) WITH MICROSCOPIC
Bilirubin Urine: NEGATIVE
Glucose, UA: NEGATIVE mg/dL
Ketones, ur: NEGATIVE mg/dL
Nitrite: NEGATIVE
PROTEIN: NEGATIVE mg/dL
RBC / HPF: >50 RBC/hpf — ABNORMAL HIGH (ref 0–5)
SPECIFIC GRAVITY, URINE: 1.015 (ref 1.005–1.030)
pH: 6 (ref 5.0–8.0)

## 2018-05-13 LAB — GLUCOSE, CAPILLARY
GLUCOSE-CAPILLARY: 103 mg/dL — AB (ref 70–99)
Glucose-Capillary: 111 mg/dL — ABNORMAL HIGH (ref 70–99)

## 2018-05-13 MED ORDER — GABAPENTIN 100 MG PO CAPS
200.0000 mg | ORAL_CAPSULE | Freq: Two times a day (BID) | ORAL | Status: DC
  Administered 2018-05-13 – 2018-05-16 (×6): 200 mg via ORAL
  Filled 2018-05-13 (×6): qty 2

## 2018-05-13 MED ORDER — CEPHALEXIN 250 MG PO CAPS
250.0000 mg | ORAL_CAPSULE | Freq: Three times a day (TID) | ORAL | Status: AC
  Administered 2018-05-13 – 2018-05-19 (×20): 250 mg via ORAL
  Filled 2018-05-13 (×20): qty 1

## 2018-05-13 MED ORDER — OXYCODONE HCL 5 MG PO TABS
10.0000 mg | ORAL_TABLET | ORAL | Status: DC | PRN
  Administered 2018-05-13 – 2018-05-20 (×50): 10 mg via ORAL
  Filled 2018-05-13 (×50): qty 2

## 2018-05-13 NOTE — Discharge Instructions (Addendum)
Inpatient Rehab Discharge Instructions  [name] Discharge date and time: No discharge date for patient encounter.   Activities/Precautions/ Functional Status: Activity: Back brace when out of bed Diet: regular diet Wound Care: keep wound clean and dry Functional status:  ___ No restrictions     ___ Walk up steps independently ___ 24/7 supervision/assistance   ___ Walk up steps with assistance ___ Intermittent supervision/assistance  ___ Bathe/dress independently ___ Walk with walker     _x__ Bathe/dress with assistance ___ Walk Independently    ___ Shower independently ___ Walk with assistance    ___ Shower with assistance ___ No alcohol     ___ Return to work/school ________   COMMUNITY REFERRALS UPON DISCHARGE:     Outpatient: PT                 Agency:  [facility] Phone: [phone]                Appointment Date/Time:  They will contact you directly to schedule appointments     Special Instructions: No driving   My questions have been answered and I understand these instructions. I will adhere to these goals and the provided educational materials after my discharge from the hospital.  Patient/Caregiver Signature _______________________________ Date __________  Clinician Signature _______________________________________ Date __________  Please bring this form and your medication list with you to all your follow-up doctor's appointments.     Information on my medicine - ELIQUIS (apixaban)  Why was Eliquis prescribed for you? Eliquis was prescribed for you to reduce the risk of a blood clot forming that can cause a stroke if you have a medical condition called atrial fibrillation (a type of irregular heartbeat).  What do You need to know about Eliquis ? Take your Eliquis TWICE DAILY - one tablet in the morning and one tablet in the evening with or without food. If you have difficulty swallowing the tablet whole please discuss  with your pharmacist how to take the medication safely.  Take Eliquis exactly as prescribed by your doctor and DO NOT stop taking Eliquis without talking to the doctor who prescribed the medication.  Stopping may increase your risk of developing a stroke.  Refill your prescription before you run out.  After discharge, you should have regular check-up appointments with your healthcare provider that is prescribing your Eliquis.  In the future your dose may need to be changed if your kidney function or weight changes by a significant amount or as you get older.  What do you do if you miss a dose? If you miss a dose, take it as soon as you remember on the same day and resume taking twice daily.  Do not take more than one dose of ELIQUIS at the same time to make up a missed dose.  Important Safety Information A possible side effect of Eliquis is bleeding. You should call your healthcare provider right away if you experience any of the following: ? Bleeding from an injury or your nose that does not stop. ? Unusual colored urine (red or dark brown) or unusual colored stools (red or black). ? Unusual bruising for unknown reasons. ? A serious fall or if you hit your head (even if there is no bleeding).  Some medicines may interact with Eliquis and might increase your risk of bleeding or clotting while on Eliquis. To help avoid this, consult your healthcare provider or pharmacist prior to using any new prescription or non-prescription  medications, including herbals, vitamins, non-steroidal anti-inflammatory drugs (NSAIDs) and supplements.  This website has more information on Eliquis (apixaban): http://www.eliquis.com/eliquis/home

## 2018-05-13 NOTE — Evaluation (Signed)
Occupational Therapy Assessment and Plan  Patient Details  Name: [name] MRN: [id number] Date of Birth: 11-Jul-1946  OT Diagnosis: abnormal posture, lumbago (low back pain) and muscle weakness (generalized) Rehab Potential:   ELOS: 7-10   Today's Date: 05/13/2018 OT Individual Time: 0900-1030 OT Individual Time Calculation (min): 90 min     Problem List:  Patient Active Problem List   Diagnosis Date Noted  . Lumbar radiculopathy 05/12/2018  . 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 Neurologic gait disorder   . Chronic atrial fibrillation   . Chronic diastolic congestive heart failure ([place])   . Prediabetes   . Post-operative pain   . History of fusion of cervical spine   . Acute blood loss anemia   . Leukocytosis   . Degenerative scoliosis 05/06/2018  . Scoliosis deformity of spine 05/06/2018  . Respiratory failure ([place]) 10/24/2016  . Anxiety 09/26/2016  . Paroxysmal atrial fibrillation ([place]) 12/20/2014  . Chronic anticoagulation 12/20/2014  . Essential hypertension 07/20/2014  . Obesity 05/04/2014  . Chronic diastolic heart failure ([place]) 05/03/2014  . Ureteral calculus, right 05/16/2012  . Hydronephrosis of right kidney 05/16/2012  . Renal calculus, left 05/16/2012  . OA (osteoarthritis) of knee 04/13/2012    Past Medical History:  Past Medical History:  Diagnosis Date  . Acute hypoxemic respiratory failure ([place]) 10/24/2016  . Arthritis    both knees  . Atrial fibrillation, chronic   . Carpal tunnel syndrome, bilateral    both hands go to sleep at times from elbows down  . Chronic diastolic heart failure ([place])   . DDD (degenerative disc disease), lumbosacral   . Dyspnea   . First degree AV block   . Hematuria 11/15/2017  . History of kidney stones   . History of rib fracture 03/2011  . Lumbar stenosis   . Obesity   . Postlaminectomy syndrome   . Pre-diabetes   . Renal calculi    BILATERAL  . Right ureteral stone   . Rotator cuff tear, left   . Rotator cuff tear,  right    Past Surgical History:  Past Surgical History:  Procedure Laterality Date  . ANTERIOR CERVICAL DECOMP/DISCECTOMY FUSION  03-18-2011   C3 -- C7  . ANTERIOR LATERAL LUMBAR FUSION 4 LEVELS N/A 05/06/2018   Procedure: Lumbar one- two Lumbar two -three Lumbar  three-four Lumbar four-five Anterolateral decompression/fusion/posterior bilateral laminectomy Lumbar four-five Lumbar five Sacral one/posterior percutaneous fixation/mazor;  Surgeon: Kristeen Miss, MD;  Location: [place];  Service: Neurosurgery;  Laterality: N/A;  . APPENDECTOMY    . APPLICATION OF ROBOTIC ASSISTANCE FOR SPINAL PROCEDURE N/A 05/06/2018   Procedure: APPLICATION OF ROBOTIC ASSISTANCE FOR SPINAL PROCEDURE;  Surgeon: Kristeen Miss, MD;  Location: [place];  Service: Neurosurgery;  Laterality: N/A;  . BILATERAL CARPAL TUNNEL RELEASE     3 screws in right hand, thumb, middle finger, and index finger,   . CARDIAC CATHETERIZATION N/A 01/16/2015   Procedure: Left Heart Cath and Coronary Angiography;  Surgeon: Belva Crome, MD;  Location: [facility];  Service: Cardiovascular;  Laterality: N/A;  . CHOLECYSTECTOMY OPEN  1970'S  . COLONOSCOPY  2018  . CYSTOSCOPY WITH RETROGRADE PYELOGRAM, URETEROSCOPY AND STENT PLACEMENT N/A 02/13/2013   Procedure: CYSTOSCOPY WITH RETROGRADE PYELOGRAM, URETEROSCOPY AND LITHOTRIPSY ;  Surgeon: Claybon Jabs, MD;  Location: [facility];  Service: Urology;  Laterality: N/A;  . CYSTOSCOPY/URETEROSCOPY/HOLMIUM LASER/STENT PLACEMENT Right 11/22/2017   Procedure: CYSTOSCOPY/URETEROSCOPY/RETROGRADE PYELOGRAM/HOLMIUM LASER/STENT PLACEMENT;  Surgeon: Kathie Rhodes, MD;  Location: [place];  Service: Urology;  Laterality: Right;  . EXTRACORPOREAL SHOCK WAVE LITHOTRIPSY Right 05-16-2012  . FINGER ARTHROSCOPY WITH CARPOMETACARPEL ([facility]) ARTHROPLASTY    . HOLMIUM LASER APPLICATION Right 11/16/3147   Procedure: HOLMIUM LASER APPLICATION;  Surgeon: Claybon Jabs, MD;  Location:  [facility];  Service: Urology;  Laterality: Right;  . KNEE ARTHROSCOPY Bilateral   . LUMBAR LAMINECTOMY  X2  1960'S  . LUMBAR PERCUTANEOUS PEDICLE SCREW 4 LEVEL N/A 05/06/2018   Procedure: LUMBAR PERCUTANEOUS PEDICLE SCREW LUMBAR ONE TO LUMBAR FIVE WITH MAZOR ;POSTERIOR LATERAL LAMINECTOMY LUMBAR FOUR-FIVE,FIVE-SACRAL ONE;  Surgeon: Kristeen Miss, MD;  Location: [place];  Service: Neurosurgery;  Laterality: N/A;  . NASAL SEPTUM SURGERY  1970'S  . SHOULDER OPEN ROTATOR CUFF REPAIR Bilateral LEFT 01-11-2001/   RIGHT 07-21-2002  . TOTAL KNEE ARTHROPLASTY  04/13/2012   Procedure: TOTAL KNEE ARTHROPLASTY;  Surgeon: Gearlean Alf, MD;  Location: [facility];  Service: Orthopedics;  Laterality: Left;  . TOTAL KNEE ARTHROPLASTY  08/08/2012   Procedure: TOTAL KNEE ARTHROPLASTY;  Surgeon: Gearlean Alf, MD;  Location: [facility];  Service: Orthopedics;  Laterality: Right;  . TRANSTHORACIC ECHOCARDIOGRAM  03-29-2011   MODERATE LVH/ EF 60-65%/ MILD MR  . VAGINAL HYSTERECTOMY  1960'S    Assessment & Plan Clinical Impression: pt is a 72 y/o female with h/o DDD,  bil rot cuff sx, afib, admitted with significant back and bil LE pain for degenerative scoliosis.  Pt s/p Anterolateral decompression L1-5 with fusion using XLIF and bone grafting.  Further fixated posteriorly with pedicular screws from L1-S1 .    Patient currently requires min- max with basic self-care skills secondary to muscle weakness, decreased cardiorespiratoy endurance, decreased coordination and decreased sitting balance, decreased standing balance and decreased balance strategies.  Prior to hospitalization, patient could complete BADL with modified independent .  Patient will benefit from skilled intervention to increase independence with basic self-care skills prior to discharge home with care partner.  Anticipate patient will require intermittent supervision and follow up home health.  OT - End of Session Activity Tolerance:  Tolerates 30+ min activity with multiple rests Endurance Deficit: Yes Endurance Deficit Description: 7-10 OT Assessment Rehab Potential (ACUTE ONLY): Good OT Patient demonstrates impairments in the following area(s): Balance;Endurance;Motor;Pain;Safety;Sensory OT Basic ADL's Functional Problem(s): Grooming;Bathing;Dressing OT Advanced ADL's Functional Problem(s): Simple Meal Preparation OT Transfers Functional Problem(s): Toilet;Tub/Shower OT Plan OT Intensity: Minimum of 1-2 x/day, 45 to 90 minutes OT Frequency: 5 out of 7 days OT Duration/Estimated Length of Stay: 7-10 OT Treatment/Interventions: Balance/vestibular training;Discharge planning;Pain management;Self Care/advanced ADL retraining;Therapeutic Activities;UE/LE Coordination activities;Cognitive remediation/compensation;Disease mangement/prevention;Functional mobility training;Patient/family education;Skin care/wound managment;Therapeutic Exercise;Visual/perceptual remediation/compensation;Community reintegration;DME/adaptive equipment instruction;Neuromuscular re-education;Psychosocial support;Splinting/orthotics;UE/LE Strength taining/ROM;Wheelchair propulsion/positioning OT Self Feeding Anticipated Outcome(s): no goal OT Basic Self-Care Anticipated Outcome(s): MOD I  OT Toileting Anticipated Outcome(s): MOD I OT Bathroom Transfers Anticipated Outcome(s): MOD I OT Recommendation Patient destination: Home Follow Up Recommendations: Home health OT Equipment Recommended: To be determined   Skilled Therapeutic Intervention 1:1. Pt educated on role/purpose of OT, CIR, ELOS< AE and back precautions. Pt completes bathing/dressing at sit to stand level at shower level and w/c at sink for dressing. Pt requries total A for footwear and max A for threading BLE into pants/underwear as pt unable to cross BLE. Pt completes all mobility with RW and VC for reach back/CGA. Pt completes toileting with MIN A ambulating into bathroom. Pt educated on  reacher/sock aide and able to don socks after OT demo AE use with supervision.  Eixted session with pt seated in bed call light in reach and all needs met.  OT Evaluation Precautions/Restrictions  Precautions Precautions: Back;Fall Precaution Booklet Issued: Yes (comment) Precaution Comments: pt able to state 1/3 precautions with all reviewed Required Braces or Orthoses: Spinal Brace Spinal Brace: Lumbar corset;Applied in sitting position Restrictions Weight Bearing Restrictions: No General Chart Reviewed: Yes Vital Signs  Pain Pain Assessment Pain Scale: 0-10 Pain Score: 8  Pain Type: Acute pain;Surgical pain Pain Location: Back Pain Orientation: Left Pain Radiating Towards: groin, leg Pain Descriptors / Indicators: Aching;Discomfort;Sharp Pain Frequency: Constant Pain Onset: On-going Pain Intervention(s): Medication (See eMAR);Repositioned Home Living/Prior [place] expects to be discharged to:: Private residence Living Arrangements: Spouse/significant other Available Help at Discharge: Family, Other (Comment) Type of Home: House Home Access: Stairs to enter [employer] of Steps: stoop on front door Entrance Stairs-Rails: Right, Left Home Layout: One level Bathroom Shower/Tub: Multimedia programmer: Handicapped height Bathroom Accessibility: Yes  Lives With: Spouse IADL History Current License: Yes Mode of Transportation: Car 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 Leisure and Hobbies: go shopping Prior Function Level of Independence: Independent with basic ADLs(using AE)  Able to Take Stairs?: Yes Driving: Yes Comments: very limited mobility lately ADL ADL Grooming: Setup Upper Body Bathing: Setup Where Assessed-Upper Body Bathing: Wheelchair Lower Body Bathing: Maximal assistance Where Assessed-Lower Body Bathing: Wheelchair, Sitting at sink, Standing at  sink Upper Body Dressing: Setup Lower Body Dressing: Maximal assistance Where Assessed-Lower Body Dressing: Standing at sink, Wheelchair Toileting: Minimal assistance Where Assessed-Toileting: Glass blower/designer: Geologist, engineering: Walk in Retail buyer: Environmental education officer Method: Radiographer, therapeutic: Gaffer Baseline Vision/History: No visual deficits Patient Visual Report: No change from baseline Vision Assessment?: No apparent visual deficits Perception  Perception: Within Functional Limits Praxis Praxis: Intact Cognition Overall Cognitive Status: Within Functional Limits for tasks assessed Arousal/Alertness: Awake/alert Orientation Level: Person;Place;Situation Person: Oriented Place: Oriented Situation: Oriented Year: 2019 Month: November Day of Week: Correct Memory: Appears intact(2/3 back precautions) Immediate Memory Recall: Sock;Blue;Bed Memory Recall: Sock;Blue;Bed Memory Recall Sock: Without Cue Memory Recall Blue: Without Cue Memory Recall Bed: Without Cue Attention: Selective Selective Attention: Appears intact Awareness: Appears intact Problem Solving: Appears intact Safety/Judgment: Appears intact Sensation Sensation Light Touch: Appears Intact Coordination Gross Motor Movements are Fluid and Coordinated: No Fine Motor Movements are Fluid and Coordinated: No Motor  Motor Motor: Within Functional Limits Motor - Skilled Clinical Observations:  movement limited by pain  Mobility  Transfers Sit to Stand: Contact Guard/Touching assist  Trunk/Postural Assessment  Cervical Assessment Cervical Assessment: Within Functional Limits Thoracic Assessment Thoracic Assessment: Within Functional Limits Lumbar Assessment Lumbar Assessment: Exceptions to WFL(back precautions) Postural Control Postural Control: Deficits on evaluation(delayed)   Balance Balance Balance Assessed: Yes Dynamic Sitting Balance Dynamic Sitting - Level of Assistance: 5: Stand by assistance Dynamic Standing Balance Dynamic Standing - Level of Assistance: 4: Min assist Dynamic Standing - Comments: dressing Extremity/Trunk Assessment RUE Assessment RUE Assessment: Within Functional Limits General Strength Comments: PTA B UE rotator cuff sx; 30* shoulder flexion, full elbow/wrist digit LUE Assessment LUE Assessment: Exceptions to [facility] General Strength Comments: PTA B UE rotator cuff sx; 80* shoulder flexion, full elbow/wrist digit     Refer to Care Plan for Long Term Goals  Recommendations for other services: Therapeutic Recreation  Pet therapy, Kitchen group, Stress management and Outing/community reintegration   Discharge Criteria:  Patient will be discharged from OT if patient refuses treatment 3 consecutive times without medical reason, if treatment goals not met, if there is a change in medical status, if patient makes no progress towards goals or if patient is discharged from hospital.  The above assessment, treatment plan, treatment alternatives and goals were discussed and mutually agreed upon: by patient  Tonny Branch 05/13/2018, 10:27 AM

## 2018-05-13 NOTE — Progress Notes (Signed)
Physical Therapy Session Note  Patient Details  Name: [name] MRN: [id number] Date of Birth: 02/26/1946  Today's Date: 05/13/2018 PT Individual Time: 1415-1500 PT Individual Time Calculation (min): 45 min   Short Term Goals: Week 1:  PT Short Term Goal 1 (Week 1): =LTGs due to ELOS  Skilled Therapeutic Interventions/Progress Updates:  Pt received seated in w/c in room, agreeable to PT. No complaints of pain. Sit to stand with min A to RW. Ambulation x 100 ft up/down ramp and across uneven surface with RW and min A for balance. TUG performed, see below. Pt has onset of R wrist muscle cramping while seated in w/c during therapy session. Muscle spasm travels up RUE into biceps. Hot pack and gentle stretches to RUE for muscle relaxation. Pt believes muscle cramping onset due to manual w/c propulsion during earlier therapy session this date. Stand pivot transfer w/c to bed with min A. Sit to supine mod A for BLE management. Pt left semi-reclined in bed with needs in reach, bed alarm in place, husband present.  Therapy Documentation Precautions:  Precautions Precautions: Back, Fall Precaution Booklet Issued: Yes (comment) Precaution Comments: pt able to state 1/3 back precautions, difficulty adhering to  Required Braces or Orthoses: Spinal Brace Spinal Brace: Lumbar corset, Applied in sitting position Restrictions Weight Bearing Restrictions: No Balance: Balance Balance Assessed: Yes Standardized Balance Assessment Standardized Balance Assessment: Timed Up and Go Test Timed Up and Go Test TUG: Normal TUG Normal TUG (seconds): 43   Therapy/Group: Individual Therapy  Excell Seltzer, PT, DPT  05/13/2018, 3:09 PM

## 2018-05-13 NOTE — Progress Notes (Addendum)
Social Work  Social Work Assessment and Plan  Patient Details  Name: [name] MRN: [id number] Date of Birth: 1945-10-23  Today's Date: 05/13/2018  Problem List:  Patient Active Problem List   Diagnosis Date Noted  . Lumbar radiculopathy 05/12/2018  . 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 Neurologic gait disorder   . Chronic atrial fibrillation   . Chronic diastolic congestive heart failure ([place])   . Prediabetes   . Post-operative pain   . History of fusion of cervical spine   . Acute blood loss anemia   . Leukocytosis   . Degenerative scoliosis 05/06/2018  . Scoliosis deformity of spine 05/06/2018  . Respiratory failure ([place]) 10/24/2016  . Anxiety 09/26/2016  . Paroxysmal atrial fibrillation ([place]) 12/20/2014  . Chronic anticoagulation 12/20/2014  . Essential hypertension 07/20/2014  . Obesity 05/04/2014  . Chronic diastolic heart failure ([place]) 05/03/2014  . Ureteral calculus, right 05/16/2012  . Hydronephrosis of right kidney 05/16/2012  . Renal calculus, left 05/16/2012  . OA (osteoarthritis) of knee 04/13/2012   Past Medical History:  Past Medical History:  Diagnosis Date  . Acute hypoxemic respiratory failure ([place]) 10/24/2016  . Arthritis    both knees  . Atrial fibrillation, chronic   . Carpal tunnel syndrome, bilateral    both hands go to sleep at times from elbows down  . Chronic diastolic heart failure ([place])   . DDD (degenerative disc disease), lumbosacral   . Dyspnea   . First degree AV block   . Hematuria 11/15/2017  . History of kidney stones   . History of rib fracture 03/2011  . Lumbar stenosis   . Obesity   . Postlaminectomy syndrome   . Pre-diabetes   . Renal calculi    BILATERAL  . Right ureteral stone   . Rotator cuff tear, left   . Rotator cuff tear, right    Past Surgical History:  Past Surgical History:  Procedure Laterality Date  . ANTERIOR CERVICAL DECOMP/DISCECTOMY FUSION  03-18-2011   C3 -- C7  . ANTERIOR LATERAL LUMBAR FUSION 4 LEVELS  N/A 05/06/2018   Procedure: Lumbar one- two Lumbar two -three Lumbar  three-four Lumbar four-five Anterolateral decompression/fusion/posterior bilateral laminectomy Lumbar four-five Lumbar five Sacral one/posterior percutaneous fixation/mazor;  Surgeon: Kristeen Miss, MD;  Location: [facility];  Service: Neurosurgery;  Laterality: N/A;  . APPENDECTOMY    . APPLICATION OF ROBOTIC ASSISTANCE FOR SPINAL PROCEDURE N/A 05/06/2018   Procedure: APPLICATION OF ROBOTIC ASSISTANCE FOR SPINAL PROCEDURE;  Surgeon: Kristeen Miss, MD;  Location: [facility];  Service: Neurosurgery;  Laterality: N/A;  . BILATERAL CARPAL TUNNEL RELEASE     3 screws in right hand, thumb, middle finger, and index finger,   . CARDIAC CATHETERIZATION N/A 01/16/2015   Procedure: Left Heart Cath and Coronary Angiography;  Surgeon: Belva Crome, MD;  Location: [facility];  Service: Cardiovascular;  Laterality: N/A;  . CHOLECYSTECTOMY OPEN  1970'S  . COLONOSCOPY  2018  . CYSTOSCOPY WITH RETROGRADE PYELOGRAM, URETEROSCOPY AND STENT PLACEMENT N/A 02/13/2013   Procedure: CYSTOSCOPY WITH RETROGRADE PYELOGRAM, URETEROSCOPY AND LITHOTRIPSY ;  Surgeon: Claybon Jabs, MD;  Location: [facility];  Service: Urology;  Laterality: N/A;  . CYSTOSCOPY/URETEROSCOPY/HOLMIUM LASER/STENT PLACEMENT Right 11/22/2017   Procedure: CYSTOSCOPY/URETEROSCOPY/RETROGRADE PYELOGRAM/HOLMIUM LASER/STENT PLACEMENT;  Surgeon: Kathie Rhodes, MD;  Location: [facility];  Service: Urology;  Laterality: Right;  . EXTRACORPOREAL SHOCK WAVE LITHOTRIPSY Right 05-16-2012  . FINGER ARTHROSCOPY WITH CARPOMETACARPEL ([facility]) ARTHROPLASTY    . HOLMIUM LASER APPLICATION  Right 02/13/2013   Procedure: HOLMIUM LASER APPLICATION;  Surgeon: Claybon Jabs, MD;  Location[facility];  Service: Urology;  Laterality: Right;  . KNEE ARTHROSCOPY Bilateral   . LUMBAR LAMINECTOMY  X2  1960'S  . LUMBAR PERCUTANEOUS PEDICLE SCREW 4 LEVEL N/A 05/06/2018    Procedure: LUMBAR PERCUTANEOUS PEDICLE SCREW LUMBAR ONE TO LUMBAR FIVE WITH MAZOR ;POSTERIOR LATERAL LAMINECTOMY LUMBAR FOUR-FIVE,FIVE-SACRAL ONE;  Surgeon: Kristeen Miss, MD;  Location: [facility];  Service: Neurosurgery;  Laterality: N/A;  . NASAL SEPTUM SURGERY  1970'S  . SHOULDER OPEN ROTATOR CUFF REPAIR Bilateral LEFT 01-11-2001/   RIGHT 07-21-2002  . TOTAL KNEE ARTHROPLASTY  04/13/2012   Procedure: TOTAL KNEE ARTHROPLASTY;  Surgeon: Gearlean Alf, MD;  Location: [facility];  Service: Orthopedics;  Laterality: Left;  . TOTAL KNEE ARTHROPLASTY  08/08/2012   Procedure: TOTAL KNEE ARTHROPLASTY;  Surgeon: Gearlean Alf, MD;  Location: [facility];  Service: Orthopedics;  Laterality: Right;  . TRANSTHORACIC ECHOCARDIOGRAM  03-29-2011   MODERATE LVH/ EF 60-65%/ MILD MR  . VAGINAL HYSTERECTOMY  1960'S   Social History:  reports that she has never smoked. She has never used smokeless tobacco. She reports that she does not drink alcohol or use drugs.  Family / Support Systems Marital Status: Married Patient Roles: Spouse Spouse/Significant Other: [name] @ (C) [phone] Children: adult children in area Other Supports: siblings live locally and very involved. Anticipated Caregiver: [name] (husband) [phone] Ability/Limitations of Caregiver: supervision  Caregiver Availability: 24/7 Family Dynamics: Pt has several family members in the room and all very encouraging and supportive.  Pt describes spouse as supportive and denies any concerns about assist available upon d/c.  Social History Preferred language: English Religion: Baptist Cultural Background: NA Read: Yes Write: Yes Employment Status: Retired Freight forwarder Issues: None Guardian/Conservator: None - per MD, pt is capable of making decisions on her own behalf.   Abuse/Neglect Abuse/Neglect Assessment Can Be Completed: Yes Physical Abuse: Denies Verbal Abuse: Denies Sexual Abuse: Denies Exploitation of  patient/patient's resources: Denies Self-Neglect: Denies  Emotional Status Pt's affect, behavior adn adjustment status: Patient very pleasant, talkative and completes assessment interview without any difficulty.  She does express concern about a brother who is currently in the hospital as well.  Laughing and talking easily with family and friends in the room.  Denies any significant emotional distress, however, will monitor while here. Recent Psychosocial Issues: None Pyschiatric History: None Substance Abuse History: None  Patient / Family Perceptions, Expectations & Goals Pt/Family understanding of illness & functional limitations: Patient and family with good understanding about surgery performed and current restrictions, limitations/need for CIR. Premorbid pt/family roles/activities: Patient independent PTA and active at home and in the community. Anticipated changes in roles/activities/participation: Per therapy goals of independent overall, do not anticipate much change in roles in the home Pt/family expectations/goals: Patient hopeful that chronic pain will now be greatly decreased and mobility improved.  Community Resources [employer]: None Premorbid Home Care/DME Agencies: None Transportation available at discharge: Yes  Discharge Planning Living Arrangements: Spouse/significant other Support Systems: Spouse/significant other, Other relatives, Water engineer, Social worker community Type of Residence: Private residence Insurance Resources: Paediatric nurse Resources: Social Security Financial Screen Referred: No Living Expenses: Own Money Management: Spouse Does the patient have any problems obtaining your medications?: No Home Management: Patient and spouse share responsibilities for the home. Patient/Family Preliminary Plans: Patient to discharge home with spouse able to provide needed assistance. Social Work Anticipated Follow Up Needs:  HH/OP Expected length  of stay: 7-10 days  Clinical Impression Very pleasant woman here following back surgery and making quick progress with the team anticipating 7-10-day length of stay.  Spouse in the home and able to provide 24/7 support.  Patient denies any significant emotional distress, however, is concerned about her brother who was also in the hospital.  Will follow for support and discharge planning needs.  Nada Godley 05/13/2018, 5:05 PM

## 2018-05-13 NOTE — IPOC Note (Signed)
Overall Plan of Care [facility]) Patient Details Name: [name] MRN: [id number] DOB: 1946/06/06  Admitting Diagnosis: <principal problem not specified>  Hospital Problems: Active Problems:   Lumbar radiculopathy   Paraparesis (HCC)   Neurologic gait disorder     Functional Problem List: Nursing Pain, Bladder, Bowel, Edema, Endurance, Medication Management, Safety, Skin Integrity  PT Balance, Endurance, Motor  OT Balance, Endurance, Motor, Pain, Safety, Sensory  SLP    TR         Basic ADL's: OT Grooming, Bathing, Dressing     Advanced  ADL's: OT Simple Meal Preparation     Transfers: PT Bed Mobility, Bed to Chair, Car, Manufacturing systems engineer, Metallurgist: PT Stairs, Ambulation, Emergency planning/management officer     Additional Impairments: OT    SLP        TR      Anticipated Outcomes Item Anticipated Outcome  Self Feeding no goal  Swallowing      Basic self-care  MOD I   Toileting  MOD I   Bathroom Transfers MOD I  Bowel/Bladder  Supervision  Transfers  mod I  Locomotion  Mod I ambulatory with LRAD  Communication     Cognition     Pain  <4 on a 1-10 pain scale  Safety/Judgment  Min assist and call for assistance   Therapy Plan: PT Intensity: Minimum of 1-2 x/day ,45 to 90 minutes PT Frequency: 5 out of 7 days PT Duration Estimated Length of Stay: 7-10 OT Intensity: Minimum of 1-2 x/day, 45 to 90 minutes OT Frequency: 5 out of 7 days OT Duration/Estimated Length of Stay: 7-10      Team Interventions: Nursing Interventions Patient/Family Education, Skin Care/Wound Management, Discharge Planning, Bladder Management, Pain Management, Psychosocial Support, Bowel Management, Medication Management  PT interventions Ambulation/gait training, Balance/vestibular training, Discharge planning, DME/adaptive equipment instruction, Functional mobility training, Patient/family education, Pain management, Neuromuscular re-education, Psychosocial support,  Splinting/orthotics, Therapeutic Exercise, Therapeutic Activities, Stair training, UE/LE Strength taining/ROM, Wheelchair propulsion/positioning, UE/LE Coordination activities  OT Interventions Balance/vestibular training, Discharge planning, Pain management, Self Care/advanced ADL retraining, Therapeutic Activities, UE/LE Coordination activities, Cognitive remediation/compensation, Disease mangement/prevention, Functional mobility training, Patient/family education, Skin care/wound managment, Therapeutic Exercise, Visual/perceptual remediation/compensation, Community reintegration, Engineer, drilling, Neuromuscular re-education, Psychosocial support, Splinting/orthotics, UE/LE Strength taining/ROM, Wheelchair propulsion/positioning  SLP Interventions    TR Interventions    SW/CM Interventions Discharge Planning, Psychosocial Support, Patient/Family Education   Barriers to Discharge MD  Medical stability  Nursing Decreased caregiver support, Wound Care, Lack of/limited family support    PT Decreased caregiver support    OT      SLP      SW       Team Discharge Planning: Destination: PT-Home ,OT- Home , SLP-  Projected Follow-up: PT-Home health PT, OT-  Home health OT, SLP-  Projected Equipment Needs: PT-To be determined, OT- To be determined, SLP-  Equipment Details: PT- , OT-  Patient/family involved in discharge planning: PT- Patient,  OT-Patient, SLP-   MD ELOS: 7-10 days Medical Rehab Prognosis:  Excellent Assessment: The patient has been admitted for CIR therapies with the diagnosis of lumbar stenosis with radiculopathy. The team will be addressing functional mobility, strength, stamina, balance, safety, adaptive techniques and equipment, self-care, bowel and bladder mgt, patient and caregiver education, pain mgt, NMR, back precautions, community reentry. Goals have been set at mod I for mobility and self-care.    Meredith Staggers, MD, [facility]      See Team  Conference Notes for weekly updates to the plan of care

## 2018-05-13 NOTE — Progress Notes (Signed)
[facility] PHYSICAL MEDICINE & REHABILITATION PROGRESS NOTE   Subjective/Complaints: Pt having a lot of pain in low bag and left leg. Makes it difficult to move. Sleep affected  ROS: Patient denies fever, rash, sore throat, blurred vision, nausea, vomiting, diarrhea, cough, shortness of breath or chest pain, joint or back pain, headache, or mood change.    Objective:   Ct Lumbar Spine Wo Contrast  Result Date: 05/11/2018 CLINICAL DATA:  LEFT groin pain following L1 through L5 XLIF. EXAM: CT LUMBAR SPINE WITHOUT CONTRAST TECHNIQUE: Multidetector CT imaging of the lumbar spine was performed without intravenous contrast administration. Multiplanar CT image reconstructions were also generated. COMPARISON:  Intraoperative radiograph 1025 19. Postoperative radiograph 05/09/2018. FINDINGS: Segmentation: Standard. Alignment: Improved scoliosis.  No subluxation. Vertebrae: No worrisome osseous lesion. Paraspinal and other soft tissues: Staghorn calculus LEFT kidney. No hydronephrosis. Minor amounts of methylmethacrylate in the LEFT psoas, L2, L3, and L4. Disc levels: L1-L2: XLIF. Pedicle screws and rods satisfactory position. No impingement. L2-L3: XLIF. Pedicle screws and rods satisfactory position. No impingement. L3-L4: XLIF. Pedicle screws and rods in satisfactory position. RIGHT foraminal osseous spurring, doubtful significance. L4-L5: XLIF. Pedicle screws and rods in good position. No impingement L5-S1: Advanced disc space narrowing. No intervertebral cage was placed in space the disc at this level. The LEFT S1 screw is positioned in a slightly medial location, but LEFT S1 nerve root impingement not established. IMPRESSION: Satisfactory L1 through L5 XLIF. No adverse features of significance. Staghorn calculus LEFT kidney, and minor amounts of methylmethacrylate in the LEFT psoas, but no hematoma or paravertebral fluid collection. Electronically Signed   By: Staci Righter M.D.   On: 05/11/2018 13:05    Vas [place] Lower Extremity Venous (dvt)  Result Date: 05/11/2018  Lower Venous Study Indications: Pain.  Limitations: Body habitus and poor ultrasound/tissue interface. Performing Technologist: Oliver Hum RVT  Examination Guidelines: A complete evaluation includes B-mode imaging, spectral Doppler, color Doppler, and power Doppler as needed of all accessible portions of each vessel. Bilateral testing is considered an integral part of a complete examination. Limited examinations for reoccurring indications may be performed as noted.  Right Venous Findings: +---------+---------------+---------+-----------+----------+-------+          CompressibilityPhasicitySpontaneityPropertiesSummary +---------+---------------+---------+-----------+----------+-------+ CFV      Full           Yes      Yes                          +---------+---------------+---------+-----------+----------+-------+ SFJ      Full                                                 +---------+---------------+---------+-----------+----------+-------+ FV Prox  Full                                                 +---------+---------------+---------+-----------+----------+-------+ FV Mid   Full                                                 +---------+---------------+---------+-----------+----------+-------+ FV DistalFull                                                 +---------+---------------+---------+-----------+----------+-------+  PFV      Full                                                 +---------+---------------+---------+-----------+----------+-------+ POP      Full           Yes      Yes                          +---------+---------------+---------+-----------+----------+-------+ PTV      Full                                                 +---------+---------------+---------+-----------+----------+-------+ PERO     Full                                                  +---------+---------------+---------+-----------+----------+-------+  Left Venous Findings: +---------+---------------+---------+-----------+----------+-------+          CompressibilityPhasicitySpontaneityPropertiesSummary +---------+---------------+---------+-----------+----------+-------+ CFV      Full           Yes      Yes                          +---------+---------------+---------+-----------+----------+-------+ SFJ      Full                                                 +---------+---------------+---------+-----------+----------+-------+ FV Prox  Full                                                 +---------+---------------+---------+-----------+----------+-------+ FV Mid   Full                                                 +---------+---------------+---------+-----------+----------+-------+ FV DistalFull                                                 +---------+---------------+---------+-----------+----------+-------+ PFV      Full                                                 +---------+---------------+---------+-----------+----------+-------+ POP      Full           Yes      Yes                          +---------+---------------+---------+-----------+----------+-------+  PTV      Full                                                 +---------+---------------+---------+-----------+----------+-------+ PERO     Full                                                 +---------+---------------+---------+-----------+----------+-------+    Summary: Right: There is no evidence of deep vein thrombosis in the lower extremity. No cystic structure found in the popliteal fossa. Left: There is no evidence of deep vein thrombosis in the lower extremity. No cystic structure found in the popliteal fossa.  *See table(s) above for measurements and observations. Electronically signed by Curt Jews MD on 05/11/2018 at 3:35:12 PM.    Final    Recent  Labs    05/12/18 0722 05/13/18 0519  WBC 12.3* 14.4*  HGB 10.5* 10.0*  HCT 32.8* 32.5*  PLT 351 384   Recent Labs    05/12/18 0722 05/13/18 0519  NA 137 138  K 3.7 3.9  CL 101 104  CO2 28 27  GLUCOSE 178* 125*  BUN 17 22  CREATININE 0.69 0.72  CALCIUM 8.7* 8.4*    Intake/Output Summary (Last 24 hours) at 05/13/2018 0826 Last data filed at 05/13/2018 0752 Gross per 24 hour  Intake 120 ml  Output -  Net 120 ml     Physical Exam: Vital Signs Blood pressure (!) 130/98, pulse 73, temperature 97.8 F (36.6 C), temperature source Oral, resp. rate (!) 21, height 5' 1.5" (1.562 m), weight 104.9 kg, SpO2 100 %.  Constitutional: No distress . Vital signs reviewed. HEENT: EOMI, oral membranes moist Neck: supple Cardiovascular: IRR without murmur. No JVD    Respiratory: CTA Bilaterally without wheezes or rales. Normal effort    GI: BS +, non-tender, non-distended  Neurological: She isalertand oriented to person, place, and time.  Skin: Back incision dressed Motor 3- Right deltoid 5/5 R Biceps Triceps grip, 5/5 Left Delt, Biceps,Triceps , grip 2-/5 R Hip flex, Kee ext, trace ankle DF 3- Left HF, KE, ankle PF/DF Sensation reduced bilateral feet up to knees Right shoulder reduced active ROM   Assessment/Plan: 1. Functional deficits secondary to lumbar stenosis/radiculopathy which require 3+ hours per day of interdisciplinary therapy in a comprehensive inpatient rehab setting.  Physiatrist is providing close team supervision and 24 hour management of active medical problems listed below.  Physiatrist and rehab team continue to assess barriers to discharge/monitor patient progress toward functional and medical goals  Care Tool:  Bathing              Bathing assist       Upper Body Dressing/Undressing Upper body dressing        Upper body assist      Lower Body Dressing/Undressing Lower body dressing            Lower body assist        Toileting Toileting    Toileting assist Assist for toileting: Maximal Assistance - Patient 25 - 49%     Transfers Chair/bed transfer  Transfers assist           Locomotion Ambulation   Ambulation assist  Walk 10 feet activity   Assist           Walk 50 feet activity   Assist           Walk 150 feet activity   Assist           Walk 10 feet on uneven surface  activity   Assist           Wheelchair     Assist               Wheelchair 50 feet with 2 turns activity    Assist            Wheelchair 150 feet activity     Assist           Medical Problem List and Plan: 1.Decreased functional mobilitysecondary to lumbar stenosis with radiculopathy status post decompression with fusion L1-2, 3-4 and 4 -5 05/06/2018. Back brace when out of bed  -begin therapies today 2. DVT Prophylaxis/Anticoagulation: Eliquis 3. Pain Management:Neurontin 200 mg nightly---increase to bid  Robaxin   as needed  Increase oxycodone to 10mg  to better control pain 4. Mood:Provide emotional support 5. Neuropsych: This patientiscapable of making decisions on herown behalf. 6. Skin/Wound Care:Routine skin checks 7. Fluids/Electrolytes/Nutrition:encourage PO  -I personally reviewed the patient's labs today.   8.Acute blood loss anemia. Follow-up CBC 9.Hypertension. Monitor with increased mobility. Continue HCTZ 25 mg daily 10.Atrial fibrillation. Cardiac rate controlled. Cardizem 180 mg daily  -observe for activity tolerance 11.Chronic diastolic congestive heart failure. Monitor for any signs of fluid overload  -daily weights 12.Constipation. Laxative assistance 13. Leukocytosis/malodorous urine: check  Ua/ucx today  -begin empiric keflex    LOS: 1 days A FACE TO FACE EVALUATION WAS PERFORMED  Meredith Staggers 05/13/2018, 8:26 AM

## 2018-05-13 NOTE — Evaluation (Signed)
Physical Therapy Assessment and Plan  Patient Details  Name: [name] MRN: [id number] Date of Birth: Aug 24, 1945  PT Diagnosis: Abnormality of gait, Difficulty walking and Muscle weakness Rehab Potential: Good ELOS: 7-10   Today's Date: 05/13/2018 PT Individual Time: 1300-1400 PT Individual Time Calculation (min): 60 min    Problem List:  Patient Active Problem List   Diagnosis Date Noted  . Lumbar radiculopathy 05/12/2018  . 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 Neurologic gait disorder   . Chronic atrial fibrillation   . Chronic diastolic congestive heart failure ([place])   . Prediabetes   . Post-operative pain   . History of fusion of cervical spine   . Acute blood loss anemia   . Leukocytosis   . Degenerative scoliosis 05/06/2018  . Scoliosis deformity of spine 05/06/2018  . Respiratory failure ([place]) 10/24/2016  . Anxiety 09/26/2016  . Paroxysmal atrial fibrillation ([place]) 12/20/2014  . Chronic anticoagulation 12/20/2014  . Essential hypertension 07/20/2014  . Obesity 05/04/2014  . Chronic diastolic heart failure ([place]) 05/03/2014  . Ureteral calculus, right 05/16/2012  . Hydronephrosis of right kidney 05/16/2012  . Renal calculus, left 05/16/2012  . OA (osteoarthritis) of knee 04/13/2012    Past Medical History:  Past Medical History:  Diagnosis Date  . Acute hypoxemic respiratory failure ([place]) 10/24/2016  . Arthritis    both knees  . Atrial fibrillation, chronic   . Carpal tunnel syndrome, bilateral    both hands go to sleep at times from elbows down  . Chronic diastolic heart failure ([place])   . DDD (degenerative disc disease), lumbosacral   . Dyspnea   . First degree AV block   . Hematuria 11/15/2017  . History of kidney stones   . History of rib fracture 03/2011  . Lumbar stenosis   . Obesity   . Postlaminectomy syndrome   . Pre-diabetes   . Renal calculi    BILATERAL  . Right ureteral stone   . Rotator cuff tear, left   . Rotator cuff tear, right    Past  Surgical History:  Past Surgical History:  Procedure Laterality Date  . ANTERIOR CERVICAL DECOMP/DISCECTOMY FUSION  03-18-2011   C3 -- C7  . ANTERIOR LATERAL LUMBAR FUSION 4 LEVELS N/A 05/06/2018   Procedure: Lumbar one- two Lumbar two -three Lumbar  three-four Lumbar four-five Anterolateral decompression/fusion/posterior bilateral laminectomy Lumbar four-five Lumbar five Sacral one/posterior percutaneous fixation/mazor;  Surgeon: Kristeen Miss, MD;  Location: [facility];  Service: Neurosurgery;  Laterality: N/A;  . APPENDECTOMY    . APPLICATION OF ROBOTIC ASSISTANCE FOR SPINAL PROCEDURE N/A 05/06/2018   Procedure: APPLICATION OF ROBOTIC ASSISTANCE FOR SPINAL PROCEDURE;  Surgeon: Kristeen Miss, MD;  Location: [facility];  Service: Neurosurgery;  Laterality: N/A;  . BILATERAL CARPAL TUNNEL RELEASE     3 screws in right hand, thumb, middle finger, and index finger,   . CARDIAC CATHETERIZATION N/A 01/16/2015   Procedure: Left Heart Cath and Coronary Angiography;  Surgeon: Belva Crome, MD;  Location: [facility];  Service: Cardiovascular;  Laterality: N/A;  . CHOLECYSTECTOMY OPEN  1970'S  . COLONOSCOPY  2018  . CYSTOSCOPY WITH RETROGRADE PYELOGRAM, URETEROSCOPY AND STENT PLACEMENT N/A 02/13/2013   Procedure: CYSTOSCOPY WITH RETROGRADE PYELOGRAM, URETEROSCOPY AND LITHOTRIPSY ;  Surgeon: Claybon Jabs, MD;  Location: [facility];  Service: Urology;  Laterality: N/A;  . CYSTOSCOPY/URETEROSCOPY/HOLMIUM LASER/STENT PLACEMENT Right 11/22/2017   Procedure: CYSTOSCOPY/URETEROSCOPY/RETROGRADE PYELOGRAM/HOLMIUM LASER/STENT PLACEMENT;  Surgeon: Kathie Rhodes, MD;  Location: [facility];  Service: Urology;  Laterality: Right;  . EXTRACORPOREAL SHOCK WAVE LITHOTRIPSY Right 05-16-2012  . FINGER ARTHROSCOPY WITH CARPOMETACARPEL ([facility]) ARTHROPLASTY    . HOLMIUM LASER APPLICATION Right 08/16/4008   Procedure: HOLMIUM LASER APPLICATION;  Surgeon: Claybon Jabs, MD;  Location[facility];  Service: Urology;  Laterality: Right;  . KNEE ARTHROSCOPY Bilateral   . LUMBAR LAMINECTOMY  X2  1960'S  . LUMBAR PERCUTANEOUS PEDICLE SCREW 4 LEVEL N/A 05/06/2018   Procedure: LUMBAR PERCUTANEOUS PEDICLE SCREW LUMBAR ONE TO LUMBAR FIVE WITH MAZOR ;POSTERIOR LATERAL LAMINECTOMY LUMBAR FOUR-FIVE,FIVE-SACRAL ONE;  Surgeon: Kristeen Miss, MD;  Location: [place] [facility];  Service: Neurosurgery;  Laterality: N/A;  . NASAL SEPTUM SURGERY  1970'S  . SHOULDER OPEN ROTATOR CUFF REPAIR Bilateral LEFT 01-11-2001/   RIGHT 07-21-2002  . TOTAL KNEE ARTHROPLASTY  04/13/2012   Procedure: TOTAL KNEE ARTHROPLASTY;  Surgeon: Gearlean Alf, MD;  Location: [facility];  Service: Orthopedics;  Laterality: Left;  . TOTAL KNEE ARTHROPLASTY  08/08/2012   Procedure: TOTAL KNEE ARTHROPLASTY;  Surgeon: Gearlean Alf, MD;  Location: [facility];  Service: Orthopedics;  Laterality: Right;  . TRANSTHORACIC ECHOCARDIOGRAM  03-29-2011   MODERATE LVH/ EF 60-65%/ MILD MR  . VAGINAL HYSTERECTOMY  1960'S    Assessment & Plan Clinical Impression: [name] is a 72 year old right-handed female with history of chronic atrial fibrillation as well as first-degree AV block maintained on Eliquis, chronic diastolic congestive heart failure, prediabetes, ACDF in 2012.  Per chart review and patient, patient lives with spouse.  One level home.  Independent prior to admission recently using a cane.  Husband works during the day.  Good support of extended family.  Presented 05/06/2018 with low back pain radiating to lower extremities.  X-rays and imaging revealed involvement of L1-3-4 and 4-5 degenerative scoliosis spinal stenosis with radiculopathy.  Underwent decompression interbody arthrodesis and fusion 05/06/2018 per Dr. Ellene Route.  Patient difficult to extubate in PACU with critical care consulted later extubated 05/07/2018 Hospital course pain management.  Back brace when out of bed.  Hospital course acute blood loss anemia 7.9 transfuse  hemoglobin 10.5.  Decadron protocol as indicated.  Noted to be a bit hypotensive felt to be related to sedation and monitored.  Chronic Eliquis has been resumed.  Therapy evaluations completed with recommendations of physical medicine rehab consult.  Patient is to be admitted for a comprehensive rehab program.    Patient currently requires min with mobility secondary to muscle weakness, decreased coordination and decreased standing balance, decreased postural control and decreased balance strategies.  Prior to hospitalization, patient was modified independent  with mobility and lived with Spouse in a House home.  Home access is stoop on front doorStairs to enter.  Patient will benefit from skilled PT intervention to maximize safe functional mobility, minimize fall risk and decrease caregiver burden for planned discharge home with 24 hour supervision available if needed.  Anticipate patient will benefit from follow up [place] at discharge.  PT - End of Session Activity Tolerance: Tolerates 10 - 20 min activity with multiple rests Endurance Deficit: Yes Endurance Deficit Description: 7-10 PT Assessment Rehab Potential (ACUTE/IP ONLY): Good PT Barriers to Discharge: Decreased caregiver support PT Patient demonstrates impairments in the following area(s): Balance;Endurance;Motor PT Transfers Functional Problem(s): Bed Mobility;Bed to Chair;Car;Furniture PT Locomotion Functional Problem(s): Stairs;Ambulation;Wheelchair Mobility PT Plan PT Intensity: Minimum of 1-2 x/day ,45 to 90 minutes PT Frequency: 5 out of 7 days PT Duration Estimated Length of Stay: 7-10 PT Treatment/Interventions: Ambulation/gait training;Balance/vestibular training;Discharge planning;DME/adaptive  equipment instruction;Functional mobility training;Patient/family education;Pain management;Neuromuscular re-education;Psychosocial support;Splinting/orthotics;Therapeutic Exercise;Therapeutic Activities;Stair training;UE/LE Strength  taining/ROM;Wheelchair propulsion/positioning;UE/LE Coordination activities PT Transfers Anticipated Outcome(s): mod I PT Locomotion Anticipated Outcome(s): Mod I ambulatory with LRAD PT Recommendation Follow Up Recommendations: Home health PT Patient destination: Home Equipment Recommended: To be determined  Skilled Therapeutic Intervention C/o pain as below.  Session focus on initial PT evaluation, pt education on role of therapy/ELOS/goals of care/safety plan, and pt education in basic functional mobility tasks.  Pt currently requiring overall min assist, as documented below, with PT providing cues for sequencing, safety, and hand placement throughout session.  Pt returned to room at end of session and positioned upright in w/c with call bell in reach and needs met.   PT Evaluation Precautions/Restrictions Precautions Precautions: Back;Fall Precaution Comments: pt able to state 1/3 back precautions, difficulty adhering to  Required Braces or Orthoses: Spinal Brace Spinal Brace: Lumbar corset;Applied in sitting position Restrictions Weight Bearing Restrictions: No General   Vital SignsTherapy Vitals Temp: 98 F (36.7 C) Pulse Rate: 78 Resp: 16 BP: (!) 123/57 Patient Position (if appropriate): Sitting Oxygen Therapy SpO2: 97 % O2 Device: Room Air Pain Pain Assessment Pain Scale: 0-10 Pain Score: 5  Pain Type: Acute pain;Surgical pain Pain Location: Back Pain Orientation: Left Pain Radiating Towards: groin, leg Pain Descriptors / Indicators: Aching;Discomfort;Grimacing Pain Frequency: Constant Pain Onset: On-going Pain Intervention(s): Emotional support;Repositioned Multiple Pain Sites: No Home Living/Prior Functioning Home Living Available Help at Discharge: Family;Other (Comment)(spouse only able to provide supervision) Type of Home: House Home Access: Stairs to enter [employer] of Steps: stoop on front door Entrance Stairs-Rails: Right;Left Home  Layout: One level  Lives With: Spouse Prior Function Level of Independence: Requires assistive device for independence  Able to Take Stairs?: Yes Driving: Yes Comments: progressively limited mobility with [facility] Vision/Perception  Perception Perception: Within Functional Limits Praxis Praxis: Intact  Cognition Overall Cognitive Status: Within Functional Limits for tasks assessed Arousal/Alertness: Awake/alert Orientation Level: Oriented X4 Selective Attention: Appears intact Memory: Appears intact Awareness: Appears intact Problem Solving: Appears intact Safety/Judgment: Appears intact Sensation Sensation Light Touch: Appears Intact(LEs) Motor  Motor Motor - Skilled Clinical Observations: limited by pain and some weakness  Mobility Bed Mobility Bed Mobility: Rolling Right;Right Sidelying to Sit Rolling Right: Minimal Assistance - Patient > 75% Right Sidelying to Sit: Minimal Assistance - Patient > 75% Transfers Transfers: Sit to [employer] Transfers Sit to Stand: Minimal Assistance - Patient > 75% Stand Pivot Transfers: Minimal Assistance - Patient > 75% Stand Pivot Transfer Details (indicate cue type and reason): cues for hand placement and adhering to back precautions Transfer (Assistive device): Rolling walker Locomotion  Gait Ambulation: Yes Gait Assistance: Contact Guard/Touching assist Gait Distance (Feet): 100 Feet Assistive device: Rolling walker Gait Gait: No Stairs / Additional Locomotion Stairs: Yes Stairs Assistance: Minimal Assistance - Patient > 75% Stair Management Technique: Two rails Number of Stairs: 4 Height of Stairs: 6 Wheelchair Mobility Wheelchair Mobility: Yes Wheelchair Assistance: Chartered loss adjuster: Both upper extremities Wheelchair Parts Management: Needs assistance Distance: 200  Trunk/Postural Assessment  Cervical Assessment Cervical Assessment: Within Functional Limits Thoracic  Assessment Thoracic Assessment: Within Functional Limits Lumbar Assessment Lumbar Assessment: Exceptions to WFL(LSO with back precautions) Postural Control Postural Control: Deficits on evaluation Protective Responses: delayed  Balance Balance Balance Assessed: Yes Dynamic Sitting Balance Dynamic Sitting - Balance Support: During functional activity;Feet supported Dynamic Sitting - Level of Assistance: 5: Stand by assistance Static Standing Balance Static Standing - Balance Support: During functional activity;No upper  extremity supported Static Standing - Level of Assistance: 5: Stand by assistance Dynamic Standing Balance Dynamic Standing - Balance Support: Bilateral upper extremity supported;During functional activity Dynamic Standing - Level of Assistance: 4: Min assist Dynamic Standing - Comments: dressing Extremity Assessment  RUE Assessment RUE Assessment: Within Functional Limits General Strength Comments: PTA B UE rotator cuff sx; 30* shoulder flexion, full elbow/wrist digit LUE Assessment LUE Assessment: Exceptions to [facility] General Strength Comments: PTA B UE rotator cuff sx; 80* shoulder flexion, full elbow/wrist digit RLE Assessment Passive Range of Motion (PROM) Comments: WFL Active Range of Motion (AROM) Comments: WFL General Strength Comments: at least 3/5 based on AROM, but unable to formally assess with resistance 2/2 pain LLE Assessment Passive Range of Motion (PROM) Comments: WFL Active Range of Motion (AROM) Comments: WFL General Strength Comments: at least 3/5 based on AROM, but unable to formally assess with resistance 2/2 pain    Refer to Care Plan for Long Term Goals  Recommendations for other services: None   Discharge Criteria: Patient will be discharged from PT if patient refuses treatment 3 consecutive times without medical reason, if treatment goals not met, if there is a change in medical status, if patient makes no progress towards goals or if  patient is discharged from hospital.  The above assessment, treatment plan, treatment alternatives and goals were discussed and mutually agreed upon: by patient  [name] 05/13/2018, 2:05 PM

## 2018-05-13 NOTE — Progress Notes (Signed)
Inpatient Rehabilitation  Patient information reviewed and entered into eRehab system by Cornisha Zetino M. Hanad Leino, M.A., CCC/SLP, PPS Coordinator.  Information including medical coding, functional ability and quality indicators will be reviewed and updated through discharge.    Per nursing patient was given "Data Collection Information Summary" for Patients in Inpatient Rehabilitation Facilities with attached "Privacy Act Statement-Health Care Records" upon admission.   

## 2018-05-14 ENCOUNTER — Inpatient Hospital Stay (HOSPITAL_COMMUNITY): Payer: PPO | Admitting: Physical Therapy

## 2018-05-14 ENCOUNTER — Inpatient Hospital Stay (HOSPITAL_COMMUNITY): Payer: PPO

## 2018-05-14 MED ORDER — LINACLOTIDE 145 MCG PO CAPS
145.0000 ug | ORAL_CAPSULE | Freq: Every day | ORAL | Status: DC
  Administered 2018-05-15: 145 ug via ORAL
  Filled 2018-05-14: qty 1

## 2018-05-14 NOTE — Progress Notes (Signed)
[name] is a 72 y.o. female 07/27/1945 [id number]  Subjective: Complaining of constipation. No new problems. Slept well. Feeling OK.  Objective: Vital signs in last 24 hours: Temp:  [97.7 F (36.5 C)-98 F (36.7 C)] 97.7 F (36.5 C) (11/02 0559) Pulse Rate:  [61-78] 61 (11/02 0559) Resp:  [16-19] 18 (11/02 0559) BP: (123-145)/(50-57) 145/50 (11/02 0559) SpO2:  [97 %-99 %] 99 % (11/02 0559) Weight change:  Last BM Date: 05/12/18  Intake/Output from previous day: 11/01 0701 - 11/02 0700 In: 600 [P.O.:600] Out: -  Last cbgs: CBG (last 3)  Recent Labs    05/12/18 2208 05/13/18 0642 05/13/18 1126  GLUCAP 150* 111* 103*     Physical Exam General: No apparent distress   HEENT: not dry Lungs: Normal effort. Lungs clear to auscultation, no crackles or wheezes. Cardiovascular: Regular rate and rhythm, no edema Abdomen: S/NT/ND; BS(+) Musculoskeletal:  unchanged Neurological: No new neurological deficits Wounds: N/A    Skin: clear  Aging changes Mental state: Alert, oriented, cooperative    Lab Results: BMET    Component Value Date/Time   NA 138 05/13/2018 0519   NA 140 09/03/2016 0805   K 3.9 05/13/2018 0519   CL 104 05/13/2018 0519   CO2 27 05/13/2018 0519   GLUCOSE 125 (H) 05/13/2018 0519   BUN 22 05/13/2018 0519   BUN 17 09/03/2016 0805   CREATININE 0.72 05/13/2018 0519   CALCIUM 8.4 (L) 05/13/2018 0519   GFRNONAA >60 05/13/2018 0519   GFRAA >60 05/13/2018 0519   CBC    Component Value Date/Time   WBC 14.4 (H) 05/13/2018 0519   RBC 3.60 (L) 05/13/2018 0519   HGB 10.0 (L) 05/13/2018 0519   HGB 12.5 09/03/2016 0805   HCT 32.5 (L) 05/13/2018 0519   HCT 37.6 09/03/2016 0805   PLT 384 05/13/2018 0519   PLT 350 09/03/2016 0805   MCV 90.3 05/13/2018 0519   MCV 88 09/03/2016 0805   MCH 27.8 05/13/2018 0519   MCHC 30.8 05/13/2018 0519   RDW 14.9 05/13/2018 0519   RDW 13.5 09/03/2016 0805   LYMPHSABS 2.7 05/13/2018 0519   MONOABS 1.2 (H)  05/13/2018 0519   EOSABS 0.0 05/13/2018 0519   BASOSABS 0.1 05/13/2018 0519    Studies/Results: No results found.  Medications: I have reviewed the patient's current medications.  Assessment/Plan:   1.  Lumbar stenosis with radiculopathy.  Status post decompression with fusion L1-2, 3-4 and 4-5 on 05/06/2018.  Continue with the back brace use.  Continue with therapies 2.  DVT prophylaxis with Eliquis 3.  Pain management with gabapentin, Robaxin and oxycodone as needed 4.  Acute blood loss anemia.  Follow-up CBC 5.  Hypertension.  Continue HCTZ and Cardizem 6.  Atrial fibrillation.  On Cardizem and Eliquis 7.  Chronic diastolic congestive heart failure.  Monitoring for the signs of fluid overload. 8.  Constipation.  Start Linzess.  Laxative of choice. 9.  Probable UTI.  On Keflex       Length of stay, days: 2  Walker Kehr , MD 05/14/2018, 11:20 AM

## 2018-05-14 NOTE — Progress Notes (Signed)
Physical Therapy Session Note  Patient Details  Name: [name] MRN: [id number] Date of Birth: February 22, 1946  Today's Date: 05/14/2018 PT Individual Time: 1330-1430 PT Individual Time Calculation (min): 60 min   Short Term Goals: Week 1:  PT Short Term Goal 1 (Week 1): =LTGs due to ELOS  Skilled Therapeutic Interventions/Progress Updates:    Pt received seated in w/c in room, agreeable to PT. No complaints of pain. Sit to stand with CGA to min A to RW. Ambulation 2 x 150 ft with RW and CGA. Pt is easily distracted during gait by her cell phone ringing or by attempting to turn around and speak to therapist while ambulating. Pt requires min cueing for safety and to attend to ambulation task and finding somewhere to sit safely before ansewring phone. Reviewed spinal precautions with patient and gave examples to improve understanding. Standing alt L/R 4" step taps with BUE support and min A for balance for BLE strengthening and coordination. Functional transfers to simulate home environment: sit to stand from recliner chair to RW with min A and v/c for safety with transfers; sit to/from supine from regular height bed with mod A for BLE management and v/c for log-roll technique. Ambulation back to room from therapy gym with RW and CGA, pt has onset of BUE cramping due to significant reliance on BUE on RW with gait. Pt has relief of cramping with seated rest break. Pt left seated in w/c in room with needs in reach.  Therapy Documentation Precautions:  Precautions Precautions: Back, Fall Precaution Booklet Issued: Yes (comment) Precaution Comments: pt able to state 1/3 back precautions, difficulty adhering to  Required Braces or Orthoses: Spinal Brace Spinal Brace: Lumbar corset, Applied in sitting position Restrictions Weight Bearing Restrictions: No   Therapy/Group: Individual Therapy  Excell Seltzer, PT, DPT  05/14/2018, 3:39 PM

## 2018-05-14 NOTE — Progress Notes (Signed)
Occupational Therapy Session Note  Patient Details  Name: [name] MRN: [id number] Date of Birth: 05/03/1946  Today's Date: 05/14/2018 OT Individual Time: 0945-1100 OT Individual Time Calculation (min): 75 min    Short Term Goals: Week 1:  OT Short Term Goal 1 (Week 1): n/a d/t ELOS  Skilled Therapeutic Interventions/Progress Updates:    1;1. Pt agreeable to bathing and dressing at sink level today. Pt bathes UB at sink with set up and dons bra with A to fasten in back and shirt/LSO with set up. Pt washes peri area and buttocks with CGA in standing. Pt doffs non skid socks and threads BLE into pants with reacher and max VC for technique. Pt rushing through process and requires VC for slowing down and completing step before moving on. Pt stands at sink iwht CGA for advancing pants past hips. Pt completes grooming at sink seated in w/c with set up. Pt requesting to visit brother in other part of hospital. Pt ambulates with RW part way to/from brothers room in hospital to visit with CGA and VC for hand placement on RW. Pt stands to take picture and visit with brother with CGA for standing endurance while talking. Exite dsession with pt returned to bed in own room, call light in reach ad exit alamr on Therapy Documentation Precautions:  Precautions Precautions: Back, Fall Precaution Booklet Issued: Yes (comment) Precaution Comments: pt able to state 1/3 back precautions, difficulty adhering to  Required Braces or Orthoses: Spinal Brace Spinal Brace: Lumbar corset, Applied in sitting position Restrictions Weight Bearing Restrictions: No General:   Vital Signs: Therapy Vitals Temp: 97.7 F (36.5 C) Temp Source: Oral Pulse Rate: 61 Resp: 18 BP: (!) 145/50 Patient Position (if appropriate): Lying Oxygen Therapy SpO2: 99 % O2 Device: Room Air   Therapy/Group: Individual Therapy  Tonny Branch 05/14/2018, 9:56 AM

## 2018-05-14 NOTE — Progress Notes (Signed)
Physical Therapy Session Note  Patient Details  Name: [name] MRN: [id number] Date of Birth: 06/27/1946  Today's Date: 05/14/2018 PT Individual Time: 1510-1610 PT Individual Time Calculation (min): 60 min   Short Term Goals: Week 1:  PT Short Term Goal 1 (Week 1): =LTGs due to ELOS  Skilled Therapeutic Interventions/Progress Updates:   Pt received sitting in WC and agreeable to PT. Pt instructed in WC mobility x 61f and then reports forearm cramps and unable to continue.   Gait training with RW x 1157fwith supervision-CGA from PT with min cues for AD management and awareness of back precautions. Pt reports tricep cramping in while ambulating, but able to continue without increased assist.   PT educated pt in stretching and importance of hydration to limit cramps.   Nustep BUE and BLE endurance training x 5 min + 4 min level 3>5. Min cues for full ROM and proper speed to reduce fatigue.   Standing balance/tolerance while engaged in Wi[employer]rich Corporationowling x 10 minutes. Supervision assist overall from PT with 1-0 UE support on RW. Cues for safety awareness of limiting trunk rotation to maintain back precautions.   [facility] and all needs met.         Therapy Documentation Precautions:  Precautions Precautions: Back, Fall Precaution Booklet Issued: Yes (comment) Precaution Comments: pt able to state 1/3 back precautions, difficulty adhering to  Required Braces or Orthoses: Spinal Brace Spinal Brace: Lumbar corset, Applied in sitting position Restrictions Weight Bearing Restrictions: No Vital Signs: Therapy Vitals Pulse Rate: 84 Resp: 20 BP: 138/76 Patient Position (if appropriate): Sitting Oxygen Therapy SpO2: 99 % O2 Device: Room Air Pain: Pain Assessment Pain Scale: 0-10 Pain Score: 1  Pain Type: Acute pain;Surgical pain Pain Location: Back Pain Orientation: Left Pain Descriptors / Indicators:  Aching;Discomfort Pain Frequency: Constant Pain Onset: On-going Patients Stated Pain Goal: 3 Pain Intervention(s): Medication (See eMAR)    Therapy/Group: Individual Therapy  AuLorie Phenix1/08/2017, 5:49 PM

## 2018-05-15 ENCOUNTER — Inpatient Hospital Stay (HOSPITAL_COMMUNITY): Payer: PPO

## 2018-05-15 DIAGNOSIS — K5900 Constipation, unspecified: Secondary | ICD-10-CM

## 2018-05-15 LAB — URINE CULTURE

## 2018-05-15 MED ORDER — LINACLOTIDE 145 MCG PO CAPS
290.0000 ug | ORAL_CAPSULE | Freq: Every day | ORAL | Status: DC
  Administered 2018-05-16 – 2018-05-18 (×2): 290 ug via ORAL
  Filled 2018-05-15 (×4): qty 2

## 2018-05-15 NOTE — Progress Notes (Signed)
[name] is a 72 y.o. female 07-Apr-1946 [id number]  Subjective: No new complaints.  The patient reports better feeling in the left leg.  Still no BM, however she received Linzess this morning for the first time  Objective: Vital signs in last 24 hours: Temp:  [97.8 F (36.6 C)-98.2 F (36.8 C)] 97.8 F (36.6 C) (11/03 0343) Pulse Rate:  [61-84] 61 (11/03 0343) Resp:  [18-20] 18 (11/03 0343) BP: (135-148)/(60-76) 135/60 (11/03 0343) SpO2:  [98 %-100 %] 100 % (11/03 0343) Weight change:  Last BM Date: 05/12/18  Intake/Output from previous day: 11/02 0701 - 11/03 0700 In: 720 [P.O.:720] Out: -  Last cbgs: CBG (last 3)  Recent Labs    05/12/18 2208 05/13/18 0642 05/13/18 1126  GLUCAP 150* 111* 103*     Physical Exam General: No apparent distress obese.  In a wheelchair HEENT: not dry Lungs: Normal effort. Lungs clear to auscultation, no crackles or wheezes. Cardiovascular: Regular rate and rhythm, no edema Abdomen: S/NT/ND; BS(+) Musculoskeletal:  unchanged Neurological: No new neurological deficits Wounds: Clean    Skin: clear  Aging changes Mental state: Alert, oriented, cooperative.    Lab Results: BMET    Component Value Date/Time   NA 138 05/13/2018 0519   NA 140 09/03/2016 0805   K 3.9 05/13/2018 0519   CL 104 05/13/2018 0519   CO2 27 05/13/2018 0519   GLUCOSE 125 (H) 05/13/2018 0519   BUN 22 05/13/2018 0519   BUN 17 09/03/2016 0805   CREATININE 0.72 05/13/2018 0519   CALCIUM 8.4 (L) 05/13/2018 0519   GFRNONAA >60 05/13/2018 0519   GFRAA >60 05/13/2018 0519   CBC    Component Value Date/Time   WBC 14.4 (H) 05/13/2018 0519   RBC 3.60 (L) 05/13/2018 0519   HGB 10.0 (L) 05/13/2018 0519   HGB 12.5 09/03/2016 0805   HCT 32.5 (L) 05/13/2018 0519   HCT 37.6 09/03/2016 0805   PLT 384 05/13/2018 0519   PLT 350 09/03/2016 0805   MCV 90.3 05/13/2018 0519   MCV 88 09/03/2016 0805   MCH 27.8 05/13/2018 0519   MCHC 30.8 05/13/2018 0519   RDW  14.9 05/13/2018 0519   RDW 13.5 09/03/2016 0805   LYMPHSABS 2.7 05/13/2018 0519   MONOABS 1.2 (H) 05/13/2018 0519   EOSABS 0.0 05/13/2018 0519   BASOSABS 0.1 05/13/2018 0519    Studies/Results: No results found.  Medications: I have reviewed the patient's current medications.  Assessment/Plan:  1.  Lumbar stenosis with radiculopathy.  Status post decompression with fusion L1-2, 3-4, 4-5 on 05/06/2018 2.  DVT prophylaxis with Eliquis 3.  Pain management with gabapentin, Robaxin and oxycodone as needed 4.  Acute blood loss anemia.  Follow-up CBC 5.  Hypertension.  Continue HCTZ and Cardizem 6.  Atrial fibrillation.  On Cardizem and Eliquis 7.  Chronic diastolic congestive heart failure.  Will continue to monitor for the signs of fluid overload 8.  Constipation.  Linzess was started this morning.  May need to adjust the dose and may need to use other laxatives as needed 9.  Probable UTI on Keflex 10.  Stress.  The patient is worried about her brother with metastatic cancer who is currently being treated at [facility].  She had a nice visit with him yesterday    Length of stay, days: 3  Walker Kehr , MD 05/15/2018, 11:15 AM

## 2018-05-15 NOTE — Progress Notes (Signed)
Occupational Therapy Session Note  Patient Details  Name: [name] MRN: [id number] Date of Birth: 12/21/1945  Today's Date: 05/15/2018 OT Individual Time: 2620-3559 OT Individual Time Calculation (min): 73 min    Short Term Goals: Week 1:  OT Short Term Goal 1 (Week 1): n/a d/t ELOS  Skilled Therapeutic Interventions/Progress Updates:    1;1. No pain reported. Pt received seated in bed, pt educated in log rolling supine (flat bed) to sitting EOB with CGA for trunk elevation. Pt completes ambulation throughout with RW and S-CGA during functional transfers to w/c or TTB. Pt completes bathing seated on TTB squatting using LHSS to wash beri area/buttock/B feet. Pt able to bathe all body parts with supervision. Pt completes dressing seated in w/c with set up for UB (pt able to fasten bra this date) and supervision for LB dressing with MOD VC for reacher technique. Ot obtained shorter reacher d/t pt arm length and pt reports easier use this date. Pt dons shoes with set up. Pt applies make up, hair care and oral care sitting at sink with set up. Exited session with pt seated in bed call light in reach and all needs met  Therapy Documentation Precautions:  Precautions Precautions: Back, Fall Precaution Booklet Issued: Yes (comment) Precaution Comments: pt able to state 1/3 back precautions, difficulty adhering to  Required Braces or Orthoses: Spinal Brace Spinal Brace: Lumbar corset, Applied in sitting position Restrictions Weight Bearing Restrictions: No Other Treatments:     Therapy/Group: Individual Therapy  Tonny Branch 05/15/2018, 7:53 AM

## 2018-05-16 ENCOUNTER — Inpatient Hospital Stay (HOSPITAL_COMMUNITY): Payer: PPO | Admitting: Occupational Therapy

## 2018-05-16 ENCOUNTER — Inpatient Hospital Stay (HOSPITAL_COMMUNITY): Payer: PPO | Admitting: Physical Therapy

## 2018-05-16 MED ORDER — GABAPENTIN 300 MG PO CAPS
300.0000 mg | ORAL_CAPSULE | Freq: Two times a day (BID) | ORAL | Status: DC
  Administered 2018-05-16 – 2018-05-19 (×6): 300 mg via ORAL
  Filled 2018-05-16 (×6): qty 1

## 2018-05-16 NOTE — Progress Notes (Signed)
Orthopedic Tech Progress Note Patient Details:  [name] [birth year] [id number]  Ortho Devices Type of Ortho Device: Ankle Air splint Ortho Device/Splint Location: lle Ortho Device/Splint Interventions: Application   Post Interventions Patient Tolerated: Well Instructions Provided: Care of device   Hildred Priest 05/16/2018, 10:09 AM

## 2018-05-16 NOTE — Progress Notes (Signed)
Physical Therapy Session Note  Patient Details  Name: [name] MRN: [id number] Date of Birth: 1946-06-08  Today's Date: 05/16/2018 PT Individual Time: 1415-1511 PT Individual Time Calculation (min): 56 min   Short Term Goals: Week 1:  PT Short Term Goal 1 (Week 1): =LTGs due to ELOS  Skilled Therapeutic Interventions/Progress Updates:    c/o 8/10 pain in lower back, RN in to medicate during session.  Session focus on gait training and activity tolerance.  PT noted pt's L forearm to have large hematoma, applied kinesiotape in fluid/edema management pattern and educated on precautions and signs of intolerance.  Sit<>stand throughout session with set up assist for positioning of RW.  Gait training x200' with RW and supervision.  High level gait weaving through cones with supervision and cues to avoid obstacles.  High level gait side stepping through cones x2 trials with RW, min cues for sequencing, supervision for safety.  Pt with decreased reliance on UEs when distracted.  Continues to require max cues to adhere to back precautions throughout session.  Pt ambulated back to room at end of session and positioned on toilet, RN aware and entering room as PT exited.    Therapy Documentation Precautions:  Precautions Precautions: Back, Fall Precaution Booklet Issued: Yes (comment) Precaution Comments: pt able to state 1/3 back precautions, difficulty adhering to  Required Braces or Orthoses: Spinal Brace Spinal Brace: Lumbar corset, Applied in sitting position Restrictions Weight Bearing Restrictions: No    Therapy/Group: Individual Therapy  [name] 05/16/2018, 3:13 PM

## 2018-05-16 NOTE — Progress Notes (Signed)
[place] PHYSICAL MEDICINE & REHABILITATION PROGRESS NOTE   Subjective/Complaints: Ongoing pain in the left leg although she's working through it. meds help. Noticing pain in left ankle, increased with wb, at night, +swelling  ROS: Patient denies fever, rash, sore throat, blurred vision, nausea, vomiting, diarrhea, cough, shortness of breath or chest pain, joint or back pain, headache, or mood change.    Objective:   No results found. No results for input(s): WBC, HGB, HCT, PLT in the last 72 hours. No results for input(s): NA, K, CL, CO2, GLUCOSE, BUN, CREATININE, CALCIUM in the last 72 hours.  Intake/Output Summary (Last 24 hours) at 05/16/2018 1142 Last data filed at 05/16/2018 0852 Gross per 24 hour  Intake 720 ml  Output -  Net 720 ml     Physical Exam: Vital Signs Blood pressure 132/60, pulse 60, temperature (!) 97.4 F (36.3 C), resp. rate 19, height 5' 1.5" (1.562 m), weight 104.9 kg, SpO2 100 %.  Constitutional: No distress . Vital signs reviewed. HEENT: EOMI, oral membranes moist Neck: supple Cardiovascular: RRR without murmur. No JVD    Respiratory: CTA Bilaterally without wheezes or rales. Normal effort    GI: BS +, non-tender, non-distended  Neurological: She isalertand oriented to person, place, and time.  Skin: Back incision dressed Motor 3 Right deltoid 5/5 R Biceps Triceps grip, 5/5 Left Delt, Biceps,Triceps , grip 2+/5 R Hip flex, Kee ext, trace ankle DF 3- Left HF, KE, ankle PF/DF Sensation reduced bilateral feet up to knees Musc: Right shoulderlimited rom. Left leg tender, + pain left ankle with pain during ev/inv of foot, swelling left lateral malleolus, medial mallelolus Psych: pleasant   Assessment/Plan: 1. Functional deficits secondary to lumbar stenosis/radiculopathy which require 3+ hours per day of interdisciplinary therapy in a comprehensive inpatient rehab setting.  Physiatrist is providing close team supervision and 24 hour  management of active medical problems listed below.  Physiatrist and rehab team continue to assess barriers to discharge/monitor patient progress toward functional and medical goals  Care Tool:  Bathing    Body parts bathed by patient: Right arm, Left arm, Chest, Abdomen, Front perineal area, Right upper leg, Left upper leg, Face   Body parts bathed by helper: Buttocks, Right lower leg, Left lower leg     Bathing assist Assist Level: Minimal Assistance - Patient > 75%     Upper Body Dressing/Undressing Upper body dressing   What is the patient wearing?: Hospital gown only    Upper body assist Assist Level: Minimal Assistance - Patient > 75%    Lower Body Dressing/Undressing Lower body dressing      What is the patient wearing?: Hospital gown only     Lower body assist Assist for lower body dressing: Minimal Assistance - Patient > 75%     Toileting Toileting Toileting Activity did not occur (Clothing management and hygiene only): N/A (no void or bm)  Toileting assist Assist for toileting: Minimal Assistance - Patient > 75%     Transfers Chair/bed transfer  Transfers assist     Chair/bed transfer assist level: Minimal Assistance - Patient > 75%     Locomotion Ambulation   Ambulation assist      Assist level: Contact Guard/Touching assist Assistive device: Walker-rolling Max distance: 115ft   Walk 10 feet activity   Assist     Assist level: Contact Guard/Touching assist Assistive device: Walker-rolling   Walk 50 feet activity   Assist    Assist level: Contact Guard/Touching assist Assistive device: Walker-rolling  Walk 150 feet activity   Assist Walk 150 feet activity did not occur: Safety/medical concerns  Assist level: Contact Guard/Touching assist Assistive device: Walker-rolling    Walk 10 feet on uneven surface  activity   Assist     Assist level: Minimal Assistance - Patient > 75% Assistive device: Horticulturist, commercial   Type of Wheelchair: Manual    Wheelchair assist level: Supervision/Verbal cueing Max wheelchair distance: 31ft    Wheelchair 50 feet with 2 turns activity    Assist        Assist Level: Supervision/Verbal cueing   Wheelchair 150 feet activity     Assist     Assist Level: Supervision/Verbal cueing     Medical Problem List and Plan: 1.Decreased functional mobilitysecondary to lumbar stenosis with radiculopathy status post decompression with fusion L1-2, 3-4 and 4 -5 05/06/2018. Back brace when out of bed  -continue therapies 2. DVT Prophylaxis/Anticoagulation: Eliquis 3. Pain Management:Neurontin 200 mg bid, increase to 300mg   Robaxin   as needed  I oxycodone  10mg   Prn  -left ankle air cast. Ice/wrap 4. Mood:Provide emotional support 5. Neuropsych: This patientiscapable of making decisions on herown behalf. 6. Skin/Wound Care:Routine skin checks 7. Fluids/Electrolytes/Nutrition:encourage PO  -I personally reviewed the patient's labs today.   8.Acute blood loss anemia. Follow-up CBC 9.Hypertension. Monitor with increased mobility. Continue HCTZ 25 mg daily 10.Atrial fibrillation. Cardiac rate controlled. Cardizem 180 mg daily  -observe for activity tolerance 11.Chronic diastolic congestive heart failure. Monitor for any signs of fluid overload  -daily weights  - Filed Weights   05/12/18 1755  Weight: 104.9 kg    12.Constipation. Laxative assistance 13. Leukocytosis/malodorous urine: E Coli UTI  -day 4 of kelfex   LOS: 4 days A FACE TO FACE EVALUATION WAS PERFORMED  Meredith Staggers 05/16/2018, 11:42 AM

## 2018-05-16 NOTE — Care Management (Addendum)
Inpatient Rehabilitation Center Individual Statement of Services  Patient Name:  [name]  Date:  05/16/2018  Welcome to the [facility].  Our goal is to provide you with an individualized program based on your diagnosis and situation, designed to meet your specific needs.  With this comprehensive rehabilitation program, you will be expected to participate in at least 3 hours of rehabilitation therapies Monday-Friday, with modified therapy programming on the weekends.  Your rehabilitation program will include the following services:  Physical Therapy (PT), Occupational Therapy (OT), 24 hour per day rehabilitation nursing, Case Management (Social Worker), Rehabilitation Medicine, Nutrition Services and Pharmacy Services  Weekly team conferences will be held on Tuesdays to discuss your progress.  Your Social Worker will talk with you frequently to get your input and to update you on team discussions.  Team conferences with you and your family in attendance may also be held.  Expected length of stay: 7-10 days   Overall anticipated outcome: independent  Depending on your progress and recovery, your program may change. Your Social Worker will coordinate services and will keep you informed of any changes. Your Social Worker's name and contact numbers are listed  below.  The following services may also be recommended but are not provided by the [facility] will be made to provide these services after discharge if needed.  Arrangements include referral to agencies that provide these services.  Your insurance has been verified to be:  [employer] Your primary doctor is:  Dr. Maudie Mercury  Pertinent information will be shared with your doctor and your insurance company.  Social Worker:  [place], [place] or ([phone]    Information discussed with and copy given to patient by: Lennart Pall, 05/16/2018, 1:50 PM

## 2018-05-16 NOTE — Progress Notes (Signed)
Occupational Therapy Session Note  Patient Details  Name: [name] MRN: [id number] Date of Birth: 01-06-1946  Today's Date: 05/16/2018 OT Individual Time: 1245-8099 OT Individual Time Calculation (min): 88 min   Short Term Goals: Week 1:  OT Short Term Goal 1 (Week 1): n/a d/t ELOS  Skilled Therapeutic Interventions/Progress Updates:    Pt greeted in bed and agreeable to session. Tx focus on balance, functional ambulation with device, endurance, and adaptive ADL skills during bathing, dressing, toileting, and grooming tasks. All functional transfers completed using RW with steady assist. She had 1 LOB while ambulating in room with Min A to correct. Pt used reacher to select clothing items from 2nd drawer, and then ambulated to toilet to void bladder and remove clothing using AE. Steady assist for using RW to transfer to TTB, where pt bathed sit<stand. She used LH sponge for LEs and back. Max A for perihygiene. Per pt, she uses a special sponge at home to reach buttocks. Pt with continuous BM in shower, and therefore transferred back to toilet. Educated pt on rocking and potty squat for thorough elimination. Also educated pt on use of toilet aide, and she completed 1/4 hygiene using device with steady assist while standing. Multiple sit<stands required for thorough perihygiene completion, with Mod-Max A from OT. She then dressed while sitting on toilet using AE with instruction. After handwashing at sink, she continued with hair brushing and makeup application EOB. OT provided her for reacher bag for walker. At end of session pt was left EOB with lunch, all needs, and visitors present.     Therapy Documentation Precautions:  Precautions Precautions: Back, Fall Precaution Booklet Issued: Yes (comment) Precaution Comments: pt able to state 1/3 back precautions, difficulty adhering to  Required Braces or Orthoses: Spinal Brace Spinal Brace: Lumbar corset, Applied in sitting  position Restrictions Weight Bearing Restrictions: No Pain: 8/10 post toileting. RN in to provide medicine during session. Provided her with aromatherapy peace blend to use via inhalation and guided pt through deep breathing at this time.   Pain Assessment Pain Scale: 0-10 Pain Score: 8  Pain Type: Acute pain;Surgical pain Pain Location: Back Pain Orientation: Left Pain Descriptors / Indicators: Aching Pain Frequency: Constant Pain Onset: On-going Patients Stated Pain Goal: 2 Pain Intervention(s): Medication (See eMAR) ADL: ADL Grooming: Setup Upper Body Bathing: Setup Where Assessed-Upper Body Bathing: Wheelchair Lower Body Bathing: Maximal assistance Where Assessed-Lower Body Bathing: Wheelchair, Sitting at sink, Standing at sink Upper Body Dressing: Setup Lower Body Dressing: Maximal assistance Where Assessed-Lower Body Dressing: Standing at sink, Wheelchair Toileting: Minimal assistance Where Assessed-Toileting: Glass blower/designer: Geologist, engineering: Walk in Facilities manager Transfer: Environmental education officer Method: Radiographer, therapeutic: Radio broadcast assistant      Therapy/Group: Individual Therapy  Saba Neuman A Avamae Dehaan 05/16/2018, 12:35 PM

## 2018-05-16 NOTE — Progress Notes (Signed)
Occupational Therapy Session Note  Patient Details  Name: [name] MRN: [id number] Date of Birth: [birth year]  Today's Date: 05/16/2018 OT Individual Time: 1300-1350 OT Individual Time Calculation (min): 50 min    Short Term Goals: Week 1:  OT Short Term Goal 1 (Week 1): n/a d/t ELOS  Skilled Therapeutic Interventions/Progress Updates:    Patient is anxious today about her brother being hospitalized.  She is eager to participate in therapy session and excited to incorporate a visit to her brother in the session.  Functional transfers:  SPT with RW to/from toilet, bed, w/c CS/CG.  Functional mobility with RW CG.  ADL:  Don shoes min A, grooming I seated, CS stance.   Toileting:  Min A for hygiene, CS for pants up/down.  Able to stand for 8 minutes during visit with her brother.  Patient remained in the w/c in her room at the end of the session without c/o pain or discomfort.    Therapy Documentation Precautions:  Precautions Precautions: Back, Fall Precaution Booklet Issued: Yes (comment) Precaution Comments: pt able to state 1/3 back precautions, difficulty adhering to  Required Braces or Orthoses: Spinal Brace Spinal Brace: Lumbar corset, Applied in sitting position Restrictions Weight Bearing Restrictions: No General:   Vital Signs: Therapy Vitals Pulse Rate: 68 Resp: 16 BP: 140/64 Oxygen Therapy SpO2: 96 % Pain: Pain Assessment Pain Scale: 0-10 Pain Score: 0-No pain Pain Type: Acute pain;Surgical pain Pain Location: Back Pain Orientation: Left Pain Descriptors / Indicators: Aching Pain Frequency: Constant Pain Onset: On-going Patients Stated Pain Goal: 2 Pain Intervention(s): Medication (See eMAR)   Therapy/Group: Individual Therapy  Carlos Levering 05/16/2018, 3:04 PM

## 2018-05-17 ENCOUNTER — Inpatient Hospital Stay (HOSPITAL_COMMUNITY): Payer: PPO | Admitting: Physical Therapy

## 2018-05-17 ENCOUNTER — Inpatient Hospital Stay (HOSPITAL_COMMUNITY): Payer: PPO | Admitting: Occupational Therapy

## 2018-05-17 NOTE — Progress Notes (Signed)
[facility] PHYSICAL MEDICINE & REHABILITATION PROGRESS NOTE   Subjective/Complaints: Pain improved. Pleased with her progress. Bladder sx also better  ROS: Patient denies fever, rash, sore throat, blurred vision, nausea, vomiting, diarrhea, cough, shortness of breath or chest pain, joint or back pain, headache, or mood change.   Objective:   No results found. No results for input(s): WBC, HGB, HCT, PLT in the last 72 hours. No results for input(s): NA, K, CL, CO2, GLUCOSE, BUN, CREATININE, CALCIUM in the last 72 hours.  Intake/Output Summary (Last 24 hours) at 05/17/2018 0835 Last data filed at 05/17/2018 0700 Gross per 24 hour  Intake 960 ml  Output -  Net 960 ml     Physical Exam: Vital Signs Blood pressure (!) 149/68, pulse (!) 59, temperature 98.6 F (37 C), temperature source Oral, resp. rate 18, height 5' 1.5" (1.562 m), weight 104.9 kg, SpO2 98 %.  Constitutional: No distress . Vital signs reviewed. HEENT: EOMI, oral membranes moist Neck: supple Cardiovascular: RRR without murmur. No JVD    Respiratory: CTA Bilaterally without wheezes or rales. Normal effort    GI: BS +, non-tender, non-distended  Neurological: She isalertand oriented to person, place, and time.  Skin: Back incision dressed/clean Motor 3 Right deltoid 5/5 R Biceps Triceps grip, 5/5 Left Delt, Biceps,Triceps , grip 2+/5 R Hip flex, 2/5 Kee ext,  tr ankle DF 3- Left HF, KE, ankle PF/DF Sensation reduced bilateral feet up to knees Musc: Right shoulderlimited rom. Left leg less tender, sl pain left ankle with pain during ev/inv of foot, swelling left lateral malleolus, medial mallelolus Psych: pleasant   Assessment/Plan: 1. Functional deficits secondary to lumbar stenosis/radiculopathy which require 3+ hours per day of interdisciplinary therapy in a comprehensive inpatient rehab setting.  Physiatrist is providing close team supervision and 24 hour management of active medical problems listed  below.  Physiatrist and rehab team continue to assess barriers to discharge/monitor patient progress toward functional and medical goals  Care Tool:  Bathing    Body parts bathed by patient: Right arm, Left arm, Chest, Abdomen, Front perineal area, Right upper leg, Left upper leg, Face, Right lower leg, Left lower leg   Body parts bathed by helper: Buttocks     Bathing assist Assist Level: Minimal Assistance - Patient > 75%(using LH sponge)     Upper Body Dressing/Undressing Upper body dressing   What is the patient wearing?: Bra, Pull over shirt    Upper body assist Assist Level: Set up assist    Lower Body Dressing/Undressing Lower body dressing      What is the patient wearing?: Underwear/pull up, Pants     Lower body assist Assist for lower body dressing: Contact Guard/Touching assist(using reacher)     Toileting Toileting Toileting Activity did not occur (Clothing management and hygiene only): N/A (no void or bm)  Toileting assist Assist for toileting: Minimal Assistance - Patient > 75%     Transfers Chair/bed transfer  Transfers assist     Chair/bed transfer assist level: Set up assist     Locomotion Ambulation   Ambulation assist      Assist level: Supervision/Verbal cueing Assistive device: Walker-rolling Max distance: 200   Walk 10 feet activity   Assist     Assist level: Supervision/Verbal cueing Assistive device: Walker-rolling   Walk 50 feet activity   Assist    Assist level: Supervision/Verbal cueing Assistive device: Walker-rolling    Walk 150 feet activity   Assist Walk 150 feet activity did not occur:  Safety/medical concerns  Assist level: Supervision/Verbal cueing Assistive device: Walker-rolling    Walk 10 feet on uneven surface  activity   Assist     Assist level: Minimal Assistance - Patient > 75% Assistive device: Aeronautical engineer   Type of Wheelchair: Manual     Wheelchair assist level: Supervision/Verbal cueing Max wheelchair distance: 49ft    Wheelchair 50 feet with 2 turns activity    Assist        Assist Level: Supervision/Verbal cueing   Wheelchair 150 feet activity     Assist     Assist Level: Supervision/Verbal cueing     Medical Problem List and Plan: 1.Decreased functional mobilitysecondary to lumbar stenosis with radiculopathy status post decompression with fusion L1-2, 3-4 and 4 -5 05/06/2018. Back brace when out of bed  -continue therapies conf today 2. DVT Prophylaxis/Anticoagulation: Eliquis 3. Pain Management:Neurontin 300 mg bid   Robaxin   as needed  -continue oxycodone  10mg   Prn  -left ankle air cast. Ice/wrap 4. Mood:Provide emotional support 5. Neuropsych: This patientiscapable of making decisions on herown behalf. 6. Skin/Wound Care:Routine skin checks 7. Fluids/Electrolytes/Nutrition:encourage PO 8.Acute blood loss anemia. Follow-up CBC this week 9.Hypertension. Monitor with increased mobility. Continue HCTZ 25 mg daily 10.Atrial fibrillation. Cardiac rate controlled. Cardizem 180 mg daily  -observe for activity tolerance 11.Chronic diastolic congestive heart failure. Monitor for any signs of fluid overload  -need daily weights  - Filed Weights   05/12/18 1755  Weight: 104.9 kg    12.Constipation. Laxative assistance 13. Leukocytosis/malodorous urine: E Coli UTI  -day 5 of kelfex   LOS: 5 days A FACE TO FACE EVALUATION WAS PERFORMED  Meredith Staggers 05/17/2018, 8:35 AM

## 2018-05-17 NOTE — Plan of Care (Signed)
  Problem: Consults Goal: RH SPINAL CORD INJURY PATIENT EDUCATION Description  See Patient Education module for education specifics.  Outcome: Progressing Goal: Skin Care Protocol Initiated - if Braden Score 18 or less Description If consults are not indicated, leave blank or document N/A Outcome: Progressing   Problem: SCI BOWEL ELIMINATION Goal: RH STG MANAGE BOWEL WITH ASSISTANCE Description STG Manage Bowel with min Assistance.  Outcome: Progressing Goal: RH STG SCI MANAGE BOWEL WITH MEDICATION WITH ASSISTANCE Description STG SCI Manage bowel with medication with min assistance.  Outcome: Progressing   Problem: SCI BLADDER ELIMINATION Goal: RH STG MANAGE BLADDER WITH ASSISTANCE Description STG Manage Bladder With min Assistance  Outcome: Progressing   Problem: RH SKIN INTEGRITY Goal: RH STG SKIN FREE OF INFECTION/BREAKDOWN Description Skin to remain free from breakdown with min assist while on rehab with min assist.  Outcome: Progressing Goal: RH STG ABLE TO PERFORM INCISION/WOUND CARE W/ASSISTANCE Description STG Able To Perform Incision/Wound Care With mod Assistance.  Outcome: Progressing   Problem: RH SAFETY Goal: RH STG ADHERE TO SAFETY PRECAUTIONS W/ASSISTANCE/DEVICE Description STG Adhere to Safety Precautions With mod Assistance appropriate assistive Device.  Outcome: Progressing   Problem: RH PAIN MANAGEMENT Goal: RH STG PAIN MANAGED AT OR BELOW PT'S PAIN GOAL Description <4 on a 1-10 pain scale  Outcome: Progressing

## 2018-05-17 NOTE — Plan of Care (Signed)
  Problem: RH Wheelchair Mobility Goal: LTG Patient will propel w/c in controlled environment (PT) Description LTG: Patient will propel wheelchair in controlled environment, # of feet with assist (PT) Outcome: Not Applicable Flowsheets (Taken 05/17/2018 1410) LTG: Pt will propel w/c in controlled environ  assist needed:: -- (d/c) Note:  D/c as pt ambulatory on unit

## 2018-05-17 NOTE — Progress Notes (Signed)
Physical Therapy Session Note  Patient Details  Name: [name] MRN: [id number] Date of Birth: 30-Mar-1946  Today's Date: 05/17/2018 PT Individual Time: 1100-1200 PT Individual Time Calculation (min): 60 min   Short Term Goals: Week 1:  PT Short Term Goal 1 (Week 1): =LTGs due to ELOS  Skilled Therapeutic Interventions/Progress Updates:    c/o 7/10 pain, RN in to medicate at end of session.  Session focus on safety and adherence to back precautions with functional mobility and LE strengthening.    Pt transfers throughout session with supervision and RW, occasional cues for hand placement when sitting.  Gait throughout unit in controlled environment with supervision and verbal cues not to twist when turning to speak to therapist.  Pt limited in her ability to abide by back precautions when she becomes distracted talking to therapist and other staff in the hallway.  High level gait training through obstacle course with supervision overall, focus on navigation on compliant surfaces and over/around obstacles.  Seated rest break between 2 trials of obstacle course.  Pt completes 2x5 sit<>stands with UE support on knees for improved carryover with transfers with RW (I.e. Not keeping both hands on RW during transition).  2x10 reps bilaterally of standing hip abduction for strengthening and balance with min assist, 1 episode of mod with LOB to L.  PT provided verbal cues for activation of hip abductors for stabilization during stance phase of exercise.  Pt returned to room at end of session and positioned in w/c with call bell in reach and needs met.   Therapy Documentation Precautions:  Precautions Precautions: Back, Fall Precaution Booklet Issued: Yes (comment) Precaution Comments: pt able to state 1/3 back precautions, difficulty adhering to  Required Braces or Orthoses: Spinal Brace Spinal Brace: Lumbar corset, Applied in sitting position Restrictions Weight Bearing Restrictions:  No    Therapy/Group: Individual Therapy  [name] 05/17/2018, 12:03 PM

## 2018-05-17 NOTE — Progress Notes (Signed)
Occupational Therapy Session Note  Patient Details  Name: [name] MRN: [id number] Date of Birth: 08/21/1945  Today's Date: 05/17/2018 OT Individual Time: 8115-7262 OT Individual Time Calculation (min): 71 min    Short Term Goals: Week 1:  OT Short Term Goal 1 (Week 1): n/a d/t ELOS  Skilled Therapeutic Interventions/Progress Updates:    Upon entering the room, pt supine in bed and finishing breakfast with 4/10 c/o pain and RN providing medications during this session. Pt ambulating with RW to dresser to obtain clothing items with use of LH reacher and min cuing for technique. Pt ambulated into bathroom in same manner for toileting needs with supervision while doffing clothing from seated position for safety. Pt transferred onto TTB for shower with use of LH sponge to increase I with LB bathing while seated. Pt exiting the shower and donning clothing items with sit >stand from EOB to dress self. Pt seated in wheelchair at sink for grooming tasks at supervision level as well and remained in wheelchair at end of session with call bell and all needed items within reach.   Therapy Documentation Precautions:  Precautions Precautions: Back, Fall Precaution Booklet Issued: Yes (comment) Precaution Comments: pt able to state 1/3 back precautions, difficulty adhering to  Required Braces or Orthoses: Spinal Brace Spinal Brace: Lumbar corset, Applied in sitting position Restrictions Weight Bearing Restrictions: No General:   Vital Signs: Therapy Vitals Temp: 98.6 F (37 C) Temp Source: Oral Pulse Rate: (!) 59 Resp: 18 BP: (!) 149/68 Patient Position (if appropriate): Sitting Oxygen Therapy SpO2: 98 % O2 Device: Room Air Pain: Pain Assessment Pain Scale: 0-10 Pain Score: 6  Pain Type: Surgical pain Pain Location: Back Pain Orientation: Lower Pain Radiating Towards: left leg Pain Descriptors / Indicators: Aching;Discomfort Pain Frequency: Constant Pain Onset:  On-going Patients Stated Pain Goal: 1 Pain Intervention(s): Medication (See eMAR)(tylenol, robaxin) Multiple Pain Sites: No ADL: ADL Grooming: Setup Upper Body Bathing: Setup Where Assessed-Upper Body Bathing: Wheelchair Lower Body Bathing: Maximal assistance Where Assessed-Lower Body Bathing: Wheelchair, Sitting at sink, Standing at sink Upper Body Dressing: Setup Lower Body Dressing: Maximal assistance Where Assessed-Lower Body Dressing: Standing at sink, Wheelchair Toileting: Minimal assistance Where Assessed-Toileting: Glass blower/designer: Geologist, engineering: Walk in Facilities manager Transfer: Environmental education officer Method: Radiographer, therapeutic: Radio broadcast assistant   Therapy/Group: Individual Therapy  Gypsy Decant 05/17/2018, 8:49 AM

## 2018-05-17 NOTE — Progress Notes (Signed)
Physical Therapy Session Note  Patient Details  Name: MORANDA BILLIOT MRN: [id number] Date of Birth: 1945-08-25  Today's Date: 05/17/2018 PT Individual Time: 4098-1191 PT Individual Time Calculation (min): 60 min   Short Term Goals: Week 1:  PT Short Term Goal 1 (Week 1): =LTGs due to ELOS  Skilled Therapeutic Interventions/Progress Updates:   Pt in supine and agreeable to therapy, pain as detailed below. Transferred to EOB w/ supervision and donned lumbar brace and shoes w/ set-up assist. Stand pivot transfer to w/c w/ supervision using RW. Total assist w/c transport to/from therapy gym, RN present providing pain medication so pt can be premedicated for session. Session focused on BLE strengthening exercises including partial knee bends 3x10, knee marches 3x10, seated LAQs 2x10, and seated knee marches 2x10. Performed NuStep 10 min @ level 3 for LE strengthening and endurance training. Educated pt on benefits of physical activity once her doctor has cleared her including walking on a regular basis and recumbent bike at gym. Returned to room via w/c, ended session in w/c and all needs in reach.   Therapy Documentation Precautions:  Precautions Precautions: Back, Fall Precaution Booklet Issued: Yes (comment) Precaution Comments: pt able to state 1/3 back precautions, difficulty adhering to  Required Braces or Orthoses: Spinal Brace Spinal Brace: Lumbar corset, Applied in sitting position Restrictions Weight Bearing Restrictions: No Vital Signs: Therapy Vitals Temp: 98.6 F (37 C) Temp Source: Oral Pulse Rate: (!) 59 Resp: 18 BP: (!) 149/68 Patient Position (if appropriate): Sitting Oxygen Therapy SpO2: 98 % O2 Device: Room Air Pain: Pain Assessment Pain Scale: 0-10 Pain Score: 6  Pain Type: Surgical pain Pain Location: Back Pain Orientation: Lower Pain Radiating Towards: left leg Pain Descriptors / Indicators: Aching;Discomfort Pain Frequency: Constant Pain Onset:  On-going Patients Stated Pain Goal: 1 Pain Intervention(s): Medication (See eMAR)(tylenol, robaxin) Multiple Pain Sites: No  Therapy/Group: Individual Therapy  Hadrian Yarbrough Clent Demark 05/17/2018, 9:51 AM

## 2018-05-17 NOTE — Discharge Summary (Signed)
Date of admission: 05/06/2018 Date of discharge: 05/13/2018 Admitting diagnosis: Lumbar spondylosis with stenosis L1-L2 3 L3-4 L4-5 lumbar radiculopathy, neurogenic claudication, degenerative lumbar scoliosis Discharge and final diagnosis: Lumbar spondylosis with stenosis L1-L2 3 L3-4 L4-5, lumbar radiculopathy, neurogenic claudication, degenerative lumbar scoliosis (Cobb angle 16 degrees) acute blood loss anemia.  Lower extremity weakness. Condition [facility]: Patient was admitted to undergo surgical decompression using an anterolateral technique to place ex lift spacers at L1-L2 3 L3-4 L4-5.  She tolerated surgery well however postoperatively had hemoglobin down to 6.4.  She received 2 units of blood in transfusion.  This improved her postoperative hemoglobin to 9.3.  She felt significantly weak particularly in her left lower extremity.  This has been evaluated and treated with physical therapy and Occupational Therapy.  It is felt that a comprehensive inpatient rehabilitation stay will help significantly.  Her incisions have been clean and dry.  Her motor function in the left lower extremity reveals iliopsoas and quadricep strength 4- out of 5 compared to the right side.  Deep tendon reflexes are absent in the patellae and the Achilles.  Condition on discharge: Improving.

## 2018-05-18 ENCOUNTER — Inpatient Hospital Stay (HOSPITAL_COMMUNITY): Payer: PPO | Admitting: Occupational Therapy

## 2018-05-18 ENCOUNTER — Inpatient Hospital Stay (HOSPITAL_COMMUNITY): Payer: PPO | Admitting: Physical Therapy

## 2018-05-18 NOTE — Progress Notes (Signed)
Occupational Therapy Session Note  Patient Details  Name: [name] MRN: [id number] Date of Birth: Dec 29, 1945  Today's Date: 05/18/2018 OT Individual Time: 6599-3570 and 1300-1358 OT Individual Time Calculation (min): 75 min and 58 min   Short Term Goals: Week 1:  OT Short Term Goal 1 (Week 1): n/a d/t ELOS  Skilled Therapeutic Interventions/Progress Updates:   Session 1: Upon entering the room, pt supine in bed and finishing breakfast. Pt agreeable to OT intervention and requesting pain medication this session secondary to "soreness" in lower back. Pt requesting to shower and ambulating with RW and supervision to obtain needed clothing items. Pt transferred onto TTB for bathing at shower level with use of LH sponge at mod I level. Pt transferred onto EOB to don clothing items with UB at mod I level and LB with supervision for safety. Pt utilized sock aid to don no slip socks independently. Pt transferred into wheelchair to performing grooming tasks at sink while seated at mod I level. Pt requesting to return to bed at end of session and ice utilized for pain management. Call bell and all needed items within reach and bed alarm activated.   Session 2: Upon entering the room, pt supine in bed and agreeable to OT intervention. OT discussed any concerns pt had in regards to discharge home. She verbalized, " I'm worried about walking around outside and at the doctor." Pt donning B shoes independently this session. Pt transferred into wheelchair with use of RW and supervision. OT assisting pt via wheelchair outside secondary to energy conservation. Pt ambulating over a variety of surfaces, slopes, and uneven surfaces at overall supervision with use of RW 350'. Pt taking seated rest break and OT reviewing pt's progress along with education for discharge recommendation. OT assisted pt back to room and pt ambulating into bathroom with supervision for hygiene and clothing management this session. Friend  present in room and pt transferred back into bed with bed alarm activated and call bell within reach.   Therapy Documentation Precautions:  Precautions Precautions: Back, Fall Precaution Booklet Issued: Yes (comment) Precaution Comments: pt able to state 1/3 back precautions, difficulty adhering to  Required Braces or Orthoses: Spinal Brace Spinal Brace: Lumbar corset, Applied in sitting position Restrictions Weight Bearing Restrictions: No   Pain: Pain Assessment Pain Score: 6  Faces Pain Scale: Hurts a little bit PAINAD (Pain Assessment in Advanced Dementia) Breathing: normal Negative Vocalization: none Facial Expression: smiling or inexpressive Body Language: relaxed Consolability: no need to console PAINAD Score: 0 ADL: ADL Grooming: Setup Upper Body Bathing: Setup Where Assessed-Upper Body Bathing: Wheelchair Lower Body Bathing: Maximal assistance Where Assessed-Lower Body Bathing: Wheelchair, Sitting at sink, Standing at sink Upper Body Dressing: Setup Lower Body Dressing: Maximal assistance Where Assessed-Lower Body Dressing: Standing at sink, Wheelchair Toileting: Minimal assistance Where Assessed-Toileting: Glass blower/designer: Geologist, engineering: Walk in Facilities manager Transfer: Environmental education officer Method: Radiographer, therapeutic: Transfer tub bench    Other Treatments:     Therapy/Group: Individual Therapy  Gypsy Decant 05/18/2018, 9:22 AM

## 2018-05-18 NOTE — Patient Care Conference (Signed)
Inpatient RehabilitationTeam Conference and Plan of Care Update Date: 05/17/2018   Time: 2:10 PM    Patient Name: [name]      Medical Record Number: [id number]  Date of Birth: 11/03/1945 Sex: Female         Room/Bed: 4W02C/4W02C-01 Payor Info: Payor: [employer]ANTAGE / Plan: Tennis Must / Product Type: *No Product type* /    Admitting Diagnosis: Lumbar Stenous  Admit Date/Time:  05/12/2018  5:52 PM Admission Comments: No comment available   Primary Diagnosis:  <principal problem not specified> Principal Problem: <principal problem not specified>  Patient Active Problem List   Diagnosis Date Noted  . Lumbar radiculopathy 05/12/2018  . 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 Neurologic gait disorder   . Chronic atrial fibrillation   . Chronic diastolic congestive heart failure ([place])   . Prediabetes   . Post-operative pain   . History of fusion of cervical spine   . Acute blood loss anemia   . Leukocytosis   . Degenerative scoliosis 05/06/2018  . Scoliosis deformity of spine 05/06/2018  . Respiratory failure ([place]) 10/24/2016  . Anxiety 09/26/2016  . Paroxysmal atrial fibrillation ([place]) 12/20/2014  . Chronic anticoagulation 12/20/2014  . Essential hypertension 07/20/2014  . Obesity 05/04/2014  . Chronic diastolic heart failure ([place]) 05/03/2014  . Ureteral calculus, right 05/16/2012  . Hydronephrosis of right kidney 05/16/2012  . Renal calculus, left 05/16/2012  . OA (osteoarthritis) of knee 04/13/2012    Expected Discharge Date: Expected Discharge Date: 05/20/18  Team Members Present: Physician leading conference: Dr. Alger Simons Social Worker Present: Lennart Pall, LCSW Nurse Present: Other (comment)(Mekides Nida, LPN) PT Present: Dwyane Dee, PT OT Present: Benay Pillow, OT SLP Present: Charolett Bumpers, SLP PPS Coordinator present : Daiva Nakayama, RN, CRRN     Current Status/Progress Goal Weekly Team Focus  Medical   lumbar stenosis with radiculopathy  s/p decompression. major pain issues. anxiety, +UTI, htn  improve functional mobility  anxiety, treatment of bladder, pain control, wound care   Bowel/Bladder   cont b/b; lbm 11/4  maintain  assess q shift and prn; hourly rounding   Swallow/Nutrition/ Hydration             ADL's   Min A bathing at shower level, Setup UB dressing, Steady assist LB dressing, Steady assist functional bathroom transfers at ambulatory level using RW. Min A toileting.   Mod I   Dynamic balance, adaptive BADL skills, functional ambulation, pt/family education, pain mgt    Mobility   supervision overall, decreased ability to follow back precautions 2/2 internal distractions  mod I with RW  d/c planning, education regarding back precautions, progressing LE strength and balance for d/c   Communication             Safety/Cognition/ Behavioral Observations            Pain   c/o pain to back  <3  assess q shift and prn; oxy; robaxin   Skin   mult ecchymotic sites including surgical site and wrist (kinesiology tape); surgical site OTA with skin glue back, flank  free from infection/breakdown  assess q shift and prn    Rehab Goals Patient on target to meet rehab goals: Yes *See Care Plan and progress notes for long and short-term goals.     Barriers to Discharge  Current Status/Progress Possible Resolutions Date Resolved   Physician    Medical stability;Wound Care        see medical problem list  Nursing                  PT                    OT Decreased caregiver support                SLP                SW                Discharge Planning/Teaching Needs:  Pt to d/c home with spouse who can provide 24/7 supervision  Teaching needs TBD   Team Discussion:  Pain better controlled.  UTI being treated.  Supervision overall with PT/ OT and on target to reach mod ind goals.  Has a very difficult time following back precautions consistently.  Recommending [place] f/u at d/c.  Revisions to Treatment  Plan:  NA    Continued Need for Acute Rehabilitation Level of Care: The patient requires daily medical management by a physician with specialized training in physical medicine and rehabilitation for the following conditions: Daily direction of a multidisciplinary physical rehabilitation program to ensure safe treatment while eliciting the highest outcome that is of practical value to the patient.: Yes Daily medical management of patient stability for increased activity during participation in an intensive rehabilitation regime.: Yes Daily analysis of laboratory values and/or radiology reports with any subsequent need for medication adjustment of medical intervention for : Post surgical problems;Neurological problems;Blood pressure problems;Urological problems   I attest that I was present, lead the team conference, and concur with the assessment and plan of the team.   [place] 05/18/2018, 11:18 AM

## 2018-05-18 NOTE — Plan of Care (Signed)
Nutrition Education Note  Pt called RD office requesting education regarding a Heart Healthy diet and ways to adhere to diet recommendations after discharge.  RD provided "Heart Healthy Nutrition Therapy" handout from the [employer]. Reviewed patient's dietary recall. Provided examples on ways to decrease sodium and fat intake in diet. Discouraged intake of processed foods and use of salt shaker. Encouraged fresh fruits and vegetables as well as whole grain sources of carbohydrates to maximize fiber intake.  Provided examples on ways to decrease sodium intake in diet. Discouraged intake of processed foods and use of salt shaker.  Expect good compliance.  Body mass index is 40.61 kg/m. Pt meets criteria for obesity class III based on current BMI.  Current diet order is Heart Healthy, patient is consuming approximately 75% of meals at this time. Labs and medications reviewed. No further nutrition interventions warranted at this time. RD contact information provided. If additional nutrition issues arise, please re-consult RD.    Gaynell Face, MS, RD, LDN Inpatient Clinical Dietitian Pager: [phone] Weekend/After Hours: [phone]

## 2018-05-18 NOTE — Progress Notes (Signed)
Physical Therapy Session Note  Patient Details  Name: [name] MRN: [id number] Date of Birth: [birth year]  Today's Date: 05/18/2018 PT Individual Time: 0907-1013 PT Individual Time Calculation (min): 66 min   Short Term Goals: Week 1:  PT Short Term Goal 1 (Week 1): =LTGs due to ELOS  Skilled Therapeutic Interventions/Progress Updates: Pt presented in bed agreeable to therapy. Pt denies back pain but stating some discomfort in groin area but was recently pre-medicated. Performed supine to sit with HOB elevated 45 degrees with supervision and verbal cues for maintaining spinal precautions. Pt requesting to use bathroom prior to leaving room, performed ambulatory transfer to bathroom and performed toilet transfers with supervision (+void). Upon completion pt was able to perform hand hygiene at sink without UE support. Pt then ambulated to rehab gym with RW and close supervision with cues to avoid twisting while turning head to look at people/objects. Participated in blocked practice STS without AD for BLE strengthening x 10. Other BLE strengthening with 1.5lb wts included LAQs, hip flexion, standing hip abd/add, hamstring curls all performed 2 x 10. Pt had many questions regarding progression of strengthening and developing walking program after d/c. PTA also provided education/recommendation of aquatic walking program and provided information on effects/benefits of buoyancy for progression. Pt ambulated back to room with RW in same manner as prior and returned to bed. Pt required minA for LE management as reinforced log roll technique to maintain spinal precautions. Pt left in bed with call bell within reach and current needs met.      Therapy Documentation Precautions:  Precautions Precautions: Back, Fall Precaution Booklet Issued: Yes (comment) Precaution Comments: pt able to state 1/3 back precautions, difficulty adhering to  Required Braces or Orthoses: Spinal Brace Spinal Brace: Lumbar  corset, Applied in sitting position Restrictions Weight Bearing Restrictions: No    Therapy/Group: Individual Therapy  Alaija Ruble  Mahreen Schewe, PTA  05/18/2018, 12:51 PM

## 2018-05-18 NOTE — Progress Notes (Signed)
[facility] PHYSICAL MEDICINE & REHABILITATION PROGRESS NOTE   Subjective/Complaints: In good spirits. Happy with progress. Pain better controlled  ROS: Patient denies fever, rash, sore throat, blurred vision, nausea, vomiting, diarrhea, cough, shortness of breath or chest pain, joint or back pain, headache, or mood change.   Objective:   No results found. No results for input(s): WBC, HGB, HCT, PLT in the last 72 hours. No results for input(s): NA, K, CL, CO2, GLUCOSE, BUN, CREATININE, CALCIUM in the last 72 hours.  Intake/Output Summary (Last 24 hours) at 05/18/2018 0823 Last data filed at 05/17/2018 1700 Gross per 24 hour  Intake 420 ml  Output -  Net 420 ml     Physical Exam: Vital Signs Blood pressure (!) 148/60, pulse (!) 56, temperature 98.2 F (36.8 C), resp. rate 18, height 5' 1.5" (1.562 m), weight 99.1 kg, SpO2 100 %.  Constitutional: No distress . Vital signs reviewed. HEENT: EOMI, oral membranes moist Neck: supple Cardiovascular: brady without murmur. No JVD    Respiratory: CTA Bilaterally without wheezes or rales. Normal effort    GI: BS +, non-tender, non-distended  Neurological: She isalertand oriented to person, place, and time.  Skin: Back incision clean, adhered glue, dry Motor 3 Right deltoid 5/5 R Biceps Triceps grip, 5/5 Left Delt, Biceps,Triceps , grip 2+/5 R Hip flex, 2/5 Kee ext,  tr ankle DF 3- Left HF, KE, ankle PF/DF Sensation reduced bilateral feet up to knees Musc: Right shoulderlimited rom. Less swelling and pain at left ankle Psych: pleasant   Assessment/Plan: 1. Functional deficits secondary to lumbar stenosis/radiculopathy which require 3+ hours per day of interdisciplinary therapy in a comprehensive inpatient rehab setting.  Physiatrist is providing close team supervision and 24 hour management of active medical problems listed below.  Physiatrist and rehab team continue to assess barriers to discharge/monitor patient progress  toward functional and medical goals  Care Tool:  Bathing    Body parts bathed by patient: Right arm, Left arm, Chest, Abdomen, Front perineal area, Right upper leg, Left upper leg, Face, Right lower leg, Left lower leg, Buttocks   Body parts bathed by helper: Buttocks     Bathing assist Assist Level: Supervision/Verbal cueing     Upper Body Dressing/Undressing Upper body dressing   What is the patient wearing?: Bra, Pull over shirt    Upper body assist Assist Level: Supervision/Verbal cueing    Lower Body Dressing/Undressing Lower body dressing      What is the patient wearing?: Underwear/pull up, Pants     Lower body assist Assist for lower body dressing: Supervision/Verbal cueing     Toileting Toileting Toileting Activity did not occur (Clothing management and hygiene only): N/A (no void or bm)  Toileting assist Assist for toileting: Supervision/Verbal cueing     Transfers Chair/bed transfer  Transfers assist     Chair/bed transfer assist level: Supervision/Verbal cueing     Locomotion Ambulation   Ambulation assist      Assist level: Supervision/Verbal cueing Assistive device: Walker-rolling Max distance: 200   Walk 10 feet activity   Assist     Assist level: Supervision/Verbal cueing Assistive device: Walker-rolling   Walk 50 feet activity   Assist    Assist level: Supervision/Verbal cueing Assistive device: Walker-rolling    Walk 150 feet activity   Assist Walk 150 feet activity did not occur: Safety/medical concerns  Assist level: Supervision/Verbal cueing Assistive device: Walker-rolling    Walk 10 feet on uneven surface  activity   Assist  Assist level: Supervision/Verbal cueing Assistive device: Aeronautical engineer   Type of Wheelchair: Agricultural engineer assist level: Supervision/Verbal cueing Max wheelchair distance: 29ft    Wheelchair 50 feet with 2 turns  activity    Assist        Assist Level: Supervision/Verbal cueing   Wheelchair 150 feet activity     Assist     Assist Level: Supervision/Verbal cueing     Medical Problem List and Plan: 1.Decreased functional mobilitysecondary to lumbar stenosis with radiculopathy status post decompression with fusion L1-2, 3-4 and 4 -5 05/06/2018. Back brace when out of bed  -continue therapies conf today 2. DVT Prophylaxis/Anticoagulation: Eliquis 3. Pain Management:Neurontin 300 mg bid   Robaxin   as needed  -continue oxycodone  10mg   Prn  -continue left ankle air cast. Ice/wrap 4. Mood:Provide emotional support 5. Neuropsych: This patientiscapable of making decisions on herown behalf. 6. Skin/Wound Care:Routine skin checks 7. Fluids/Electrolytes/Nutrition:encourage PO 8.Acute blood loss anemia. Follow-up CBC this week 9.Hypertension. Monitor with increased mobility. Continue HCTZ 25 mg daily 10.Atrial fibrillation. Cardiac rate controlled. Cardizem 180 mg daily  -observe for activity tolerance 11.Chronic diastolic congestive heart failure. Monitor for any signs of fluid overload  -weights down  - Filed Weights   05/12/18 1755 05/18/18 0450  Weight: 104.9 kg 99.1 kg    12.Constipation. Laxative assistance 13. Leukocytosis/malodorous urine: E Coli UTI  -day 6 of kelfex   LOS: 6 days A FACE TO FACE EVALUATION WAS PERFORMED  Meredith Staggers 05/18/2018, 8:23 AM

## 2018-05-19 ENCOUNTER — Inpatient Hospital Stay (HOSPITAL_COMMUNITY): Payer: PPO

## 2018-05-19 ENCOUNTER — Inpatient Hospital Stay (HOSPITAL_COMMUNITY): Payer: PPO | Admitting: Physical Therapy

## 2018-05-19 ENCOUNTER — Inpatient Hospital Stay (HOSPITAL_COMMUNITY): Payer: PPO | Admitting: Occupational Therapy

## 2018-05-19 MED ORDER — GABAPENTIN 300 MG PO CAPS
300.0000 mg | ORAL_CAPSULE | Freq: Three times a day (TID) | ORAL | Status: DC
  Administered 2018-05-19 – 2018-05-20 (×3): 300 mg via ORAL
  Filled 2018-05-19 (×3): qty 1

## 2018-05-19 MED ORDER — SENNOSIDES-DOCUSATE SODIUM 8.6-50 MG PO TABS
2.0000 | ORAL_TABLET | Freq: Every day | ORAL | Status: DC
  Administered 2018-05-19: 2 via ORAL
  Filled 2018-05-19: qty 2

## 2018-05-19 NOTE — Discharge Summary (Signed)
Discharge summary job [id number]

## 2018-05-19 NOTE — Plan of Care (Signed)
  Problem: RH Bed Mobility Goal: LTG Patient will perform bed mobility with assist (PT) Description LTG: Patient will perform bed mobility with assistance, with/without cues (PT). Outcome: Not Met (add Reason) Note:  Continues to require min assist to lift RLE into bed and cues for safety awareness/back precautions with bed mobility.  Will have assist from husband at home.    Problem: RH Car Transfers Goal: LTG Patient will perform car transfers with assist (PT) Description LTG: Patient will perform car transfers with assistance (PT). Outcome: Not Met (add Reason) Note:  Continues to require min assist to lift LLE into simulator car, will have assist from husband, if needed, for car transfers at d/c.

## 2018-05-19 NOTE — Progress Notes (Signed)
Occupational Therapy Session Note  Patient Details  Name: [name] MRN: [id number] Date of Birth: May 07, 1946  Today's Date: 05/19/2018 OT Individual Time: 3151-7616 OT Individual Time Calculation (min): 30 min    Short Term Goals: Week 1:  OT Short Term Goal 1 (Week 1): n/a d/t ELOS      Skilled Therapeutic Interventions/Progress Updates:    Pt seen this session to work on bed mobility skills of sidelying to sit with and without use of leg lifter.  Pt initially worked on bed positioning as she has been sleeping with HOB elevated. Worked on positioning in sidelying on flat pad placing pillows between knees to maintain back alignment. Her spouse can also place a pillow behind her back.   Pt rolled onto R side pushed into sitting with guiding A without lifter.  With lifter she was able to do so independently.  Pt practiced movement 2x with good form.  Asked pt to recall steps and demonstrate to her next OT at 1pm to ensure she could do it.  Pt resting in bed with alarm on and all needs met.  Therapy Documentation Precautions:  Precautions Precautions: Back, Fall Precaution Booklet Issued: Yes (comment) Precaution Comments: pt able to state 1/3 back precautions, difficulty adhering to  Required Braces or Orthoses: Spinal Brace Spinal Brace: Lumbar corset, Applied in sitting position Restrictions Weight Bearing Restrictions: No    Vital Signs: Therapy Vitals Temp: 97.7 F (36.5 C) Pulse Rate: 63 BP: (!) 146/64 Patient Position (if appropriate): Sitting Oxygen Therapy SpO2: 99 % O2 Device: Room Air Pain: Pain Assessment Pain Scale: 0-10 Pain Score: 8  Pain Type: Acute pain Pain Location: Back Pain Orientation: Lower;Mid Pain Radiating Towards: left leg Pain Descriptors / Indicators: Aching;Throbbing Pain Frequency: Constant Pain Onset: On-going Patients Stated Pain Goal: 1 Pain Intervention(s): Medication (See eMAR) Multiple Pain Sites: No ADL: ADL Grooming:  Setup Upper Body Bathing: Setup Where Assessed-Upper Body Bathing: Wheelchair Lower Body Bathing: Maximal assistance Where Assessed-Lower Body Bathing: Wheelchair, Sitting at sink, Standing at sink Upper Body Dressing: Setup Lower Body Dressing: Maximal assistance Where Assessed-Lower Body Dressing: Standing at sink, Wheelchair Toileting: Minimal assistance Where Assessed-Toileting: Glass blower/designer: Geologist, engineering: Walk in Facilities manager Transfer: Environmental education officer Method: Radiographer, therapeutic: Radio broadcast assistant  Therapy/Group: Individual Therapy  SAGUIER,JULIA 05/19/2018, 8:23 AM

## 2018-05-19 NOTE — Progress Notes (Signed)
Occupational Therapy Discharge Summary  Patient Details  Name: [name] MRN: [id number] Date of Birth: [birth year]  Today's Date: 05/19/2018 OT Individual Time: 5449-2010 OT Individual Time Calculation (min): 69 min    Patient has met 10 of 10 long term goals due to improved activity tolerance, improved balance, postural control and ability to compensate for deficits.  Patient to discharge at overall Modified Independent level.  Patient's care partner is independent to provide the necessary intermittent supervision at discharge.    Reasons goals not met: NA  Recommendation:  Patient will benefit from ongoing skilled OT services in no OT follow-up to continue to advance functional skills in the area of N/A.  Equipment: No equipment provided  Reasons for discharge: treatment goals met  Patient/family agrees with progress made and goals achieved: Yes    OT Treatment Session: Treatment session held with focus on bathing/dressing ADLs. Pt ambulatory within room during session using RW at mod independent level. Pt completing toileting initially, while seated on toilet doffing clothing in preparation for bathing task. Pt completing both with distant supervision. Pt transitions to sitting on TTB in shower using RW, completes bathing at sit<>stand level with mod independence, min cues to recall/use AE (LH sponge) during task to maintain back precautions. Pt intermittently requiring cues not to twist throughout functional tasks this session. Pt transitioned back to toilet and completed UB/LB dressing at mod independent level and x1 cue to utilize reacher to assist with LB dressing task, setup assist provided to don lumbar brace. Pt ambulated to sink where she stood to blow dry and brush hair. Pt taking seated rest break EOB, using sock aide to don socks while sitting. Returned to sink for completing oral care with good demonstration of back precautions during task. Pt returned to sitting EOB and  supine in bed using leg lifter to assist with LLE, completing with mod independence. Pt left supine in bed end of session with bed alarm set, call bell and needs within reach.    OT Discharge Precautions/Restrictions  Precautions Precautions: Back Precaution Booklet Issued: Yes (comment) Precaution Comments: pt able to state 3/3 back precautions, but does not adhere to twisting precaution despite constant cuing Required Braces or Orthoses: Spinal Brace Spinal Brace: Lumbar corset;Applied in sitting position Restrictions Weight Bearing Restrictions: No   Pain Pain Assessment Pain Scale: Faces Faces Pain Scale: Hurts little more Pain Type: Surgical pain Pain Location: Back Pain Orientation: Mid;Lower Pain Descriptors / Indicators: Sore;Aching Pain Onset: With Activity Pain Intervention(s): Repositioned;Rest Multiple Pain Sites: No ADL ADL Equipment Provided: Reacher, Sock aid, Long-handled sponge Grooming: Modified independent Where Assessed-Grooming: Standing at sink Upper Body Bathing: Modified independent Where Assessed-Upper Body Bathing: Shower Lower Body Bathing: Modified independent Where Assessed-Lower Body Bathing: Shower Upper Body Dressing: Modified independent (Device) Where Assessed-Upper Body Dressing: Other (Comment)(seated on toilet) Lower Body Dressing: Modified independent Where Assessed-Lower Body Dressing: Other (Comment)(seated on toilet ) Toileting: Supervision/safety Where Assessed-Toileting: Glass blower/designer: Diplomatic Services operational officer Method: Optometrist: Walk in Retail buyer: Environmental education officer Method: Radiographer, therapeutic: Gaffer Baseline Vision/History: No visual deficits Patient Visual Report: No change from baseline Vision Assessment?: No apparent visual deficits Perception    Praxis Praxis: Intact Cognition Overall  Cognitive Status: Within Functional Limits for tasks assessed Arousal/Alertness: Awake/alert Orientation Level: Oriented X4 Attention: Selective Selective Attention: Appears intact Memory: Appears intact Awareness: Appears intact Problem Solving: Appears intact Safety/Judgment: Appears intact Sensation Sensation Light Touch:  Appears Intact(UEs) Coordination Gross Motor Movements are Fluid and Coordinated: Yes Fine Motor Movements are Fluid and Coordinated: Yes Coordination and Movement Description: WFL Motor  Motor Motor: Within Functional Limits Motor - Discharge Observations: limited somewhat by pain but improved significantly since eval Mobility  Bed Mobility Bed Mobility: Rolling Right;Right Sidelying to Sit;Sit to Sidelying Right Rolling Right: Independent with assistive device Right Sidelying to Sit: Independent with assistive device Sit to Sidelying Right: Independent with assistive device Transfers Sit to Stand: Independent with assistive device Stand to Sit: Independent with assistive device  Trunk/Postural Assessment  Cervical Assessment Cervical Assessment: Within Functional Limits Thoracic Assessment Thoracic Assessment: Within Functional Limits Lumbar Assessment Lumbar Assessment: (s/p spinal surgery ) Postural Control Postural Control: Within Functional Limits  Balance Balance Balance Assessed: Yes Dynamic Sitting Balance Dynamic Sitting - Balance Support: During functional activity;Feet supported Dynamic Sitting - Level of Assistance: 6: Modified independent (Device/Increase time) Static Standing Balance Static Standing - Balance Support: During functional activity;No upper extremity supported Static Standing - Level of Assistance: 6: Modified independent (Device/Increase time) Dynamic Standing Balance Dynamic Standing - Balance Support: Bilateral upper extremity supported;During functional activity Dynamic Standing - Level of Assistance: 6: Modified  independent (Device/Increase time) Extremity/Trunk Assessment RUE Assessment RUE Assessment: Exceptions to [facility] General Strength Comments: PTA B UE rotator cuff sx; grossly 90* shoulder flexion, full elbow/wrist digit LUE Assessment LUE Assessment: Within Functional Limits   Raymondo Band 05/19/2018, 4:30 PM

## 2018-05-19 NOTE — Discharge Summary (Signed)
NAME: Emily, Wang MEDICAL RECORD [id number] ACCOUNT [id number] DATE OF BIRTH:08/15/1945 FACILITY: [facility] LOCATION: [facility] PHYSICIAN:Emily SWARTZ, MD  DISCHARGE SUMMARY  DATE OF DISCHARGE:  05/20/2018  DISCHARGE DIAGNOSES:   1.  Lumbar stenosis with radiculopathy, status post decompression and fusion.  05/06/2018  2.  Deep venous thrombosis prophylaxis with Eliquis. 3.  Pain management. 4.  Acute blood loss anemia. 5.  Hypertension. 6.  Atrial fibrillation. 7.  Chronic diastolic congestive heart failure. 8.  Escherichia coli urinary tract infection.  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  The patient lives with  spouse, 1 level home, independent using a cane prior to admission.  Husband works during the day.  Presented 05/06/2018 with low back pain radiating to lower extremities.  X-rays and imaging revealed involvement of L1-3-4 and L4-L5 degenerative scoliosis, spinal stenosis with radiculopathy.  Underwent decompression, interbody arthrodesis and fusion 05/06/2018 per Dr. Ellene Wang.  The patient difficult to extubate in PACU with  critical care consulted later extubated 05/07/2018.  HOSPITAL COURSE:  Pain management.  Back brace when out of bed.  Blood loss anemia 7.9.  She was transfused.  Decadron protocol is indicated.  Noted to be a bit hypotensive, felt to be related to sedation and monitored.  Chronic Eliquis had been resumed.   Therapy evaluations completed and the patient was admitted for comprehensive rehabilitation program.  PAST MEDICAL HISTORY:  See discharge diagnoses.  SOCIAL HISTORY:  Lives with spouse.  She used a cane prior to admission.  FUNCTIONAL STATUS:  Upon admission to rehab services was minimal assist sit to stand, minimal guard 100 feet rolling walker, min mod  assist with activities of daily living.  PHYSICAL EXAMINATION: VITAL SIGNS:  Blood pressure 134/55, pulse 80, temperature 98, respirations 18. GENERAL:  Alert female in no acute distress. HEENT:  EOMs intact. NECK:  Supple, nontender, no JVD. CARDIOVASCULAR:  Rate controlled. ABDOMEN:  Soft, nontender, good bowel sounds. LUNGS:  Clear to auscultation without wheeze back. Back incision was clean and dry.  REHABILITATION HOSPITAL COURSE:  The patient was admitted to inpatient rehabilitation services.  Therapies initiated on a 3-hour daily basis, consisting of physical therapy, occupational therapy and rehabilitation nursing.  The following issues were  addressed during patient's rehabilitation stay.  Pertaining to the patient's lumbar decompression fusion 05/06/2018 back incision clean and dry.  Back brace when out of bed.  She would follow up with neurosurgery.  Dr. Ellene Wang.    She remained on chronic Eliquis for atrial fibrillation.  Cardiac rate controlled.  No chest pain or shortness of breath.  Blood pressure is overall monitored.  She continued on Cardizem as well as HCTZ.    Acute blood loss anemia, stable.  Latest hemoglobin of 10.    E. coli UTI.  She was placed on Keflex.  No dysuria or hematuria.    Chronic diastolic congestive heart failure.  She exhibited no signs of fluid overload.    Bouts of constipation with bowel program established.    Pain management with the use of Neurontin 300 mg twice daily as well as Robaxin and oxycodone.    The patient received weekly collaborative interdisciplinary team conferences to discuss estimated length of stay, family teaching, any barriers to discharge.  She can ambulate to the rehabilitation gymnasium with a rolling walker, close supervision.   Participated in blocked  practice working with energy conservation techniques.  She could ambulate back to her room after her sessions.  Gathered belongings for activities of daily living and  homemaking maintaining her back precautions.  Worked on higher  level gait training through obstacle course with supervision.  Full family teaching was completed and planned discharge to home.  DISCHARGE MEDICATIONS:  Included Eliquis 5 mg p.o. b.i.d., Keflex 250 mg p.o. q.8h.  Decadron taper as directed, Cardizem 180 mg p.o. daily, folic acid 1 mg p.o. daily, Neurontin 300 mg p.o. b.i.d., HCTZ 25 mg p.o. daily, Linzess 290 mcg daily, Singulair  10 mg p.o. at bedtime, MiraLax twice daily, hold for loose stool.  Senokot twice daily, vitamin B12 500 mcg p.o. daily, Robaxin 500 mg every 6 hours as needed for muscle spasms, oxycodone 10 mg q.3 hours as needed for pain.  DIET:  Her diet was regular.    FOLLOWUP:  She would follow up with Dr. Alger Wang at the outpatient rehab service office as directed; Dr. Kristeen Wang call for appointment; Dr. Jani Wang, medical management.  SPECIAL INSTRUCTIONS:  Back brace when out of bed.  No driving.  AN/NUANCE G:28/36/6294 T:05/19/2018 JOB:[id number]

## 2018-05-19 NOTE — Progress Notes (Signed)
[place] PHYSICAL MEDICINE & REHABILITATION PROGRESS NOTE   Subjective/Complaints: Had a rough night. Left leg really bothered her from knee to foot. Burning/tingling.   ROS: Patient denies fever, rash, sore throat, blurred vision, nausea, vomiting, diarrhea, cough, shortness of breath or chest pain, headache, or mood change.    Objective:   No results found. No results for input(s): WBC, HGB, HCT, PLT in the last 72 hours. No results for input(s): NA, K, CL, CO2, GLUCOSE, BUN, CREATININE, CALCIUM in the last 72 hours.  Intake/Output Summary (Last 24 hours) at 05/19/2018 1037 Last data filed at 05/19/2018 0837 Gross per 24 hour  Intake 540 ml  Output -  Net 540 ml     Physical Exam: Vital Signs Blood pressure (!) 146/64, pulse 63, temperature 97.7 F (36.5 C), resp. rate 18, height 5' 1.5" (1.562 m), weight 99.2 kg, SpO2 99 %.  Constitutional: No distress . Vital signs reviewed. HEENT: EOMI, oral membranes moist Neck: supple Cardiovascular: RRR without murmur. No JVD    Respiratory: CTA Bilaterally without wheezes or rales. Normal effort    GI: BS +, non-tender, non-distended  Neurological: She isalertand oriented to person, place, and time.  Skin: Back incision clean, adhered glue, dry Motor 3 Right deltoid 5/5 R Biceps Triceps grip, 5/5 Left Delt, Biceps,Triceps , grip 2+/5 R Hip flex, 2/5 Kee ext,  2-3 ADF 3- Left HF, KE, ankle PF/DF Sensation reduced bilateral feet up to knees, left leg sensitive to touch Musc: Right shoulderlimited rom. Less swelling and pain at left ankle Psych: pleasant   Assessment/Plan: 1. Functional deficits secondary to lumbar stenosis/radiculopathy which require 3+ hours per day of interdisciplinary therapy in a comprehensive inpatient rehab setting.  Physiatrist is providing close team supervision and 24 hour management of active medical problems listed below.  Physiatrist and rehab team continue to assess barriers to  discharge/monitor patient progress toward functional and medical goals  Care Tool:  Bathing    Body parts bathed by patient: Right arm, Left arm, Chest, Abdomen, Front perineal area, Right upper leg, Left upper leg, Face, Right lower leg, Left lower leg, Buttocks   Body parts bathed by helper: Buttocks     Bathing assist Assist Level: Independent with assistive device Assistive Device Comment: LH sponge   Upper Body Dressing/Undressing Upper body dressing   What is the patient wearing?: Bra, Pull over shirt    Upper body assist Assist Level: Independent    Lower Body Dressing/Undressing Lower body dressing      What is the patient wearing?: Underwear/pull up, Pants     Lower body assist Assist for lower body dressing: Supervision/Verbal cueing     Toileting Toileting Toileting Activity did not occur (Clothing management and hygiene only): N/A (no void or bm)  Toileting assist Assist for toileting: Supervision/Verbal cueing     Transfers Chair/bed transfer  Transfers assist     Chair/bed transfer assist level: Independent with assistive device Chair/bed transfer assistive device: Walker, Orthosis   Locomotion Ambulation   Ambulation assist      Assist level: Independent with assistive device Assistive device: Orthosis Max distance: 280ft   Walk 10 feet activity   Assist     Assist level: Independent with assistive device Assistive device: Walker-rolling, Orthosis   Walk 50 feet activity   Assist    Assist level: Independent with assistive device Assistive device: Walker-rolling, Orthosis    Walk 150 feet activity   Assist Walk 150 feet activity did not occur: Safety/medical concerns  Assist level: Independent with assistive device Assistive device: Walker-rolling, Orthosis    Walk 10 feet on uneven surface  activity   Assist     Assist level: Independent with assistive device Assistive device: Walker-rolling, Orthosis    Wheelchair     Assist Will patient use wheelchair at discharge?: No Type of Wheelchair: Manual    Wheelchair assist level: Supervision/Verbal cueing Max wheelchair distance: 85ft    Wheelchair 50 feet with 2 turns activity    Assist        Assist Level: Supervision/Verbal cueing   Wheelchair 150 feet activity     Assist     Assist Level: Supervision/Verbal cueing     Medical Problem List and Plan: 1.Decreased functional mobilitysecondary to lumbar stenosis with radiculopathy status post decompression with fusion L1-2, 3-4 and 4 -5 05/06/2018. Back brace when out of bed  -progressing toward goals 2. DVT Prophylaxis/Anticoagulation: Eliquis 3. Pain Management:Neurontin 300 mg bid---increase to TID  -reviewed pain/mgt plan with patoemt   Robaxin   as needed  -continue oxycodone  10mg   Prn  -continue left ankle air cast. Ice/wrap 4. Mood:Provide emotional support 5. Neuropsych: This patientiscapable of making decisions on herown behalf. 6. Skin/Wound Care:Routine skin checks 7. Fluids/Electrolytes/Nutrition:encourage PO 8.Acute blood loss anemia. Follow-up CBC tomorrow 9.Hypertension. Monitor with increased mobility. Continue HCTZ 25 mg daily 10.Atrial fibrillation. Cardiac rate controlled. Cardizem 180 mg daily  -observe for activity tolerance 11.Chronic diastolic congestive heart failure. Monitor for any signs of fluid overload  -weights down  - Filed Weights   05/12/18 1755 05/18/18 0450 05/19/18 0428  Weight: 104.9 kg 99.1 kg 99.2 kg    12.Constipation. Laxative assistance 13. Leukocytosis/malodorous urine: E Coli UTI  -day 7 of kelfex ---dc after today's doses  =check cbc tomorrow LOS: 7 days A FACE TO FACE EVALUATION WAS PERFORMED  Meredith Staggers 05/19/2018, 10:37 AM

## 2018-05-19 NOTE — Progress Notes (Signed)
Occupational Therapy Session Note  Patient Details  Name: MOLLEIGH HUOT MRN: [id number] Date of Birth: 1946/05/13  Today's Date: 05/19/2018 OT Individual Time: 1300-1330 OT Individual Time Calculation (min): 30 min    Short Term Goals: Week 1:  OT Short Term Goal 1 (Week 1): n/a d/t ELOS  Skilled Therapeutic Interventions/Progress Updates:    OT intervention with focus on bed mobility, donning LSO, functional amb with RW, toilet transfers, and safety awareness to increase independence with BADLs. Pt sat EOB with leg lifter, donned LSO, donned shoes,and amb to bathroom without assistance.  Pt performed toilet transfers without assistance using grab bars.  Pt returned to bed and supine with use of leg lifter.  Pt followed all back precautions during session.  Pt remained in bed with all needs within reach and bed alarm activated. PT notified of agreement with recommendation to make pt Mod I in room.   Therapy Documentation Precautions:  Precautions Precautions: Back Precaution Booklet Issued: Yes (comment) Precaution Comments: pt able to state 3/3 back precautions, but does not adhere to twisting precaution despite constant cuing Required Braces or Orthoses: Spinal Brace Spinal Brace: Lumbar corset, Applied in sitting position Restrictions Weight Bearing Restrictions: No Pain:  Pt c/o RLE pain (groin)-unrated; repositioned   Therapy/Group: Individual Therapy  Leroy Libman 05/19/2018, 2:49 PM

## 2018-05-19 NOTE — Progress Notes (Signed)
Physical Therapy Discharge Summary  Patient Details  Name: [name] MRN: [id number] Date of Birth: 04/22/1946  Today's Date: 05/19/2018 PT Individual Time: 0900-1000 PT Individual Time Calculation (min): 60 min    Patient has met 5 of 7 long term goals due to improved activity tolerance, improved balance, improved postural control and increased strength.  Patient to discharge at an ambulatory level Modified Independent.   Patient's care partner is independent to provide the necessary physical assistance at discharge.  Reasons goals not met: Pt continues to require min assist for supine>sit and car transfers to lift LLE up.  Pt's husband will be able to provide this assist, if needed, at d/c.  Pt also continues to require cues to adhere to back precautions with these activities.   Recommendation:  Patient will benefit from ongoing skilled PT services in home health setting to continue to advance safe functional mobility, address ongoing impairments in balance, activity tolerance, and awareness/attention, and minimize fall risk.  Equipment: RW  Reasons for discharge: treatment goals met  Patient/family agrees with progress made and goals achieved: Yes   Skilled PT:  C/o 8/10 pain but reports premedicated.  Session focus on d/c assessment and pt education.  PT provided continued education on back precautions and importance of adhering to these (specifically no twisting when ambulating), pt able to verbalize understanding but demos poor functional carryover with mobility.  Pt completes transfers and ambulation throughout session with mod I, except car transfer she requires min assist to lift LLE into car.  Pt able to transition to EOB mod I, but again, requires min assist to lift LLE back in to bed at end of session.  PT applied kinesiotape in fluid dynamic pattern for edema and ecchymosis management to pt's L forearm, reiterated education on signs of intolerance and precautions for heat.   Pt returned to room at end of session and positioned in bed with call bell in reach and needs met.   PT Discharge Precautions/Restrictions Precautions Precautions: Back Precaution Comments: pt able to state 3/3 back precautions, but does not adhere to twisting precaution despite constant cuing Required Braces or Orthoses: Spinal Brace Spinal Brace: Lumbar corset;Applied in sitting position Restrictions Weight Bearing Restrictions: No Pain Pain Assessment Pain Scale: 0-10 Pain Score: 8  Pain Type: Surgical pain Pain Location: Back Pain Orientation: Lower;Mid Pain Radiating Towards: left leg Pain Descriptors / Indicators: Aching;Throbbing Pain Frequency: Constant Pain Onset: On-going Patients Stated Pain Goal: 1 Pain Intervention(s): RN made aware;Emotional support Multiple Pain Sites: No PAINAD (Pain Assessment in Advanced Dementia) Breathing: normal Negative Vocalization: none Facial Expression: smiling or inexpressive Body Language: relaxed Consolability: no need to console PAINAD Score: 0 Vision/Perception  Perception Perception: Within Functional Limits Praxis Praxis: Intact  Cognition Overall Cognitive Status: Within Functional Limits for tasks assessed Arousal/Alertness: Awake/alert Orientation Level: Oriented X4 Selective Attention: Appears intact Memory: Appears intact Awareness: Appears intact Problem Solving: Appears intact Safety/Judgment: Appears intact Sensation Sensation Light Touch: Appears Intact(LEs) Additional Comments: does endorse N/T in bilateral thighs Coordination Gross Motor Movements are Fluid and Coordinated: Yes Fine Motor Movements are Fluid and Coordinated: Yes Coordination and Movement Description: WFL Motor  Motor Motor: Within Functional Limits Motor - Discharge Observations: limited somewhat by pain but improved significantly since eval  Mobility Bed Mobility Bed Mobility: Rolling Right;Right Sidelying to Sit Rolling Right:  Independent with assistive device Right Sidelying to Sit: Independent with assistive device Transfers Transfers: Stand to Sit;Sit to Stand Sit to Stand: Independent with assistive device Stand  to Sit: Independent with assistive device Stand Pivot Transfers: Independent with assistive device Transfer (Assistive device): Rolling walker Locomotion  Gait Ambulation: Yes Gait Assistance: Independent with assistive device Gait Distance (Feet): 250 Feet Assistive device: Rolling walker Stairs / Additional Locomotion Stairs: Yes Stairs Assistance: Supervision/Verbal cueing Stair Management Technique: Two rails Number of Stairs: 4 Height of Stairs: 6 Wheelchair Mobility Wheelchair Mobility: No  Trunk/Postural Assessment  Cervical Assessment Cervical Assessment: Within Functional Limits Thoracic Assessment Thoracic Assessment: Within Functional Limits Lumbar Assessment Lumbar Assessment: (LSO) Postural Control Postural Control: Within Functional Limits  Balance Balance Balance Assessed: Yes Standardized Balance Assessment Standardized Balance Assessment: Timed Up and Go Test Timed Up and Go Test TUG: Normal TUG Normal TUG (seconds): 19.73 Dynamic Sitting Balance Dynamic Sitting - Balance Support: During functional activity;Feet supported Dynamic Sitting - Level of Assistance: 6: Modified independent (Device/Increase time) Static Standing Balance Static Standing - Balance Support: During functional activity;No upper extremity supported Static Standing - Level of Assistance: 6: Modified independent (Device/Increase time) Dynamic Standing Balance Dynamic Standing - Balance Support: Bilateral upper extremity supported;During functional activity Dynamic Standing - Level of Assistance: 6: Modified independent (Device/Increase time) Extremity Assessment      RLE Assessment Passive Range of Motion (PROM) Comments: WFL Active Range of Motion (AROM) Comments: WFL General Strength  Comments: at least 3/5 based on AROM, but unable to formally assess with resistance 2/2 pain LLE Assessment Passive Range of Motion (PROM) Comments: WFL Active Range of Motion (AROM) Comments: WFL General Strength Comments: at least 3/5 based on AROM, but unable to formally assess with resistance 2/2 pain    [name] 05/19/2018, 9:34 AM

## 2018-05-20 ENCOUNTER — Inpatient Hospital Stay (HOSPITAL_COMMUNITY): Payer: PPO | Admitting: Occupational Therapy

## 2018-05-20 LAB — CBC
HEMATOCRIT: 33.4 % — AB (ref 36.0–46.0)
Hemoglobin: 10.2 g/dL — ABNORMAL LOW (ref 12.0–15.0)
MCH: 28.3 pg (ref 26.0–34.0)
MCHC: 30.5 g/dL (ref 30.0–36.0)
MCV: 92.8 fL (ref 80.0–100.0)
NRBC: 0 % (ref 0.0–0.2)
PLATELETS: 415 10*3/uL — AB (ref 150–400)
RBC: 3.6 MIL/uL — ABNORMAL LOW (ref 3.87–5.11)
RDW: 14.7 % (ref 11.5–15.5)
WBC: 14.5 10*3/uL — AB (ref 4.0–10.5)

## 2018-05-20 MED ORDER — DILTIAZEM HCL ER COATED BEADS 120 MG PO CP24: 120.0000 mg | ORAL_CAPSULE | Freq: Every day | ORAL | 3 refills | Status: DC

## 2018-05-20 MED ORDER — SENNOSIDES-DOCUSATE SODIUM 8.6-50 MG PO TABS: 2.0000 | ORAL_TABLET | Freq: Every day | ORAL | Status: DC

## 2018-05-20 MED ORDER — HYDROCHLOROTHIAZIDE 25 MG PO TABS: 25.0000 mg | ORAL_TABLET | Freq: Every day | ORAL | 1 refills | Status: DC

## 2018-05-20 MED ORDER — POLYETHYLENE GLYCOL 3350 17 G PO PACK: 17.0000 g | PACK | Freq: Two times a day (BID) | ORAL | 0 refills | Status: DC

## 2018-05-20 MED ORDER — FOLIC ACID 1 MG PO TABS: 1.0000 mg | ORAL_TABLET | Freq: Every day | ORAL | 0 refills | Status: DC

## 2018-05-20 MED ORDER — APIXABAN 5 MG PO TABS: 5.0000 mg | ORAL_TABLET | Freq: Two times a day (BID) | ORAL | 3 refills | Status: DC

## 2018-05-20 MED ORDER — GABAPENTIN 300 MG PO CAPS: 300.0000 mg | ORAL_CAPSULE | Freq: Three times a day (TID) | ORAL | 1 refills | Status: DC

## 2018-05-20 MED ORDER — CYANOCOBALAMIN 500 MCG PO TABS: 500.0000 ug | ORAL_TABLET | Freq: Every day | ORAL | 0 refills | Status: DC

## 2018-05-20 MED ORDER — MONTELUKAST SODIUM 10 MG PO TABS: 10.0000 mg | ORAL_TABLET | Freq: Every day | ORAL | 1 refills | Status: DC

## 2018-05-20 MED ORDER — OXYCODONE HCL 10 MG PO TABS: 10.0000 mg | ORAL_TABLET | ORAL | 0 refills | Status: DC | PRN

## 2018-05-20 MED ORDER — METHOCARBAMOL 500 MG PO TABS: 500.0000 mg | ORAL_TABLET | Freq: Four times a day (QID) | ORAL | 0 refills | Status: DC | PRN

## 2018-05-20 NOTE — Progress Notes (Signed)
Social Work Patient ID: [name], female   DOB: Apr 29, 1946, 72 y.o.   MRN: [id number]   Have reviewed team conference with pt and family. Both aware and agreeable with targeted d/c date of  05/20/18 and goals of modified independent.  Pt requests that follow up therapy be arranged at [place] at [facility].  Owen Pagnotta, LCSW

## 2018-05-20 NOTE — Plan of Care (Signed)
  Problem: RH SKIN INTEGRITY Goal: RH STG SKIN FREE OF INFECTION/BREAKDOWN Description Skin to remain free from breakdown with min assist while on rehab with min assist.  Outcome: Progressing  Continue to assess skin  Problem: RH SAFETY Goal: RH STG ADHERE TO SAFETY PRECAUTIONS W/ASSISTANCE/DEVICE Description STG Adhere to Safety Precautions With mod Assistance appropriate assistive Device.  Outcome: Progressing  Call light within reach, bed alarm, proper footwear  Problem: RH PAIN MANAGEMENT Goal: RH STG PAIN MANAGED AT OR BELOW PT'S PAIN GOAL Description <4 on a 1-10 pain scale  Outcome: Progressing  Assess pain, administer pain regimen as order and needed.

## 2018-05-20 NOTE — Progress Notes (Signed)
Social Work  Discharge Note  The overall goal for the admission was met for:   Discharge location: Yes - home with spouse able to provide any needed assistance.  Length of Stay: Yes - 8 days  Discharge activity level: Yes - modified independent  Home/community participation: Yes  Services provided included: MD, RD, PT, OT, RN, TR, Pharmacy and SW  Financial Services: Other: [employer]  Follow-up services arranged: Outpatient: PT to resume at [facility] per pt request and Patient/Family request agency HH: OP at [facility], DME: NA - had all needed DME.  Comments (or additional information):  Patient/Family verbalized understanding of follow-up arrangements: Yes  Individual responsible for coordination of the follow-up plan: pt  Confirmed correct DME delivered:  NA  Jacqueli Pangallo

## 2018-05-20 NOTE — Progress Notes (Signed)
Pt has all belongings, educated on follow up appointment and discharge instruction

## 2018-05-20 NOTE — Progress Notes (Signed)
[place] PHYSICAL MEDICINE & REHABILITATION PROGRESS NOTE   Subjective/Complaints: Feeling better today. Excited about getting home. Left leg seems to have calmed down  ROS: Patient denies fever, rash, sore throat, blurred vision, nausea, vomiting, diarrhea, cough, shortness of breath or chest pain, joint or back pain, headache, or mood change.   Objective:   No results found. Recent Labs    05/20/18 0434  WBC 14.5*  HGB 10.2*  HCT 33.4*  PLT 415*   No results for input(s): NA, K, CL, CO2, GLUCOSE, BUN, CREATININE, CALCIUM in the last 72 hours.  Intake/Output Summary (Last 24 hours) at 05/20/2018 0825 Last data filed at 05/19/2018 1835 Gross per 24 hour  Intake 600 ml  Output -  Net 600 ml     Physical Exam: Vital Signs Blood pressure (!) 152/47, pulse 69, temperature 98.9 F (37.2 C), temperature source Oral, resp. rate (!) 22, height 5' 1.5" (1.562 m), weight 99.7 kg, SpO2 98 %.  Constitutional: No distress . Vital signs reviewed. HEENT: EOMI, oral membranes moist Neck: supple Cardiovascular: RRR without murmur. No JVD    Respiratory: CTA Bilaterally without wheezes or rales. Normal effort    GI: BS +, non-tender, non-distended  Neurological: She isalertand oriented to person, place, and time.  Skin: Back incision clean, adhered glue, dry---unchanged Motor 3 Right deltoid 5/5 R Biceps Triceps grip, 5/5 Left Delt, Biceps,Triceps , grip 2+/5 R Hip flex, 2/5 Kee ext,  2-3 ADF 3- Left HF, KE, ankle PF/DF Sensation reduced bilateral feet up to knees, left leg remains somewhat sensitive to touch Musc: Right shoulderlimited rom. Less swelling and pain at left ankle Psych: pleasant   Assessment/Plan: 1. Functional deficits secondary to lumbar stenosis/radiculopathy which require 3+ hours per day of interdisciplinary therapy in a comprehensive inpatient rehab setting.  Physiatrist is providing close team supervision and 24 hour management of active medical problems  listed below.  Physiatrist and rehab team continue to assess barriers to discharge/monitor patient progress toward functional and medical goals  Care Tool:  Bathing    Body parts bathed by patient: Right arm, Left arm, Chest, Abdomen, Front perineal area, Right upper leg, Left upper leg, Face, Right lower leg, Left lower leg, Buttocks   Body parts bathed by helper: Buttocks     Bathing assist Assist Level: Independent with assistive device Assistive Device Comment: LH sponge   Upper Body Dressing/Undressing Upper body dressing   What is the patient wearing?: Bra, Pull over shirt    Upper body assist Assist Level: Independent    Lower Body Dressing/Undressing Lower body dressing      What is the patient wearing?: Underwear/pull up, Pants     Lower body assist Assist for lower body dressing: Supervision/Verbal cueing     Toileting Toileting Toileting Activity did not occur (Clothing management and hygiene only): N/A (no void or bm)  Toileting assist Assist for toileting: Supervision/Verbal cueing     Transfers Chair/bed transfer  Transfers assist     Chair/bed transfer assist level: Independent with assistive device Chair/bed transfer assistive device: Walker, Orthosis   Locomotion Ambulation   Ambulation assist      Assist level: Independent with assistive device Assistive device: Orthosis Max distance: 282ft   Walk 10 feet activity   Assist     Assist level: Independent with assistive device Assistive device: Walker-rolling, Orthosis   Walk 50 feet activity   Assist    Assist level: Independent with assistive device Assistive device: Walker-rolling, Orthosis    Walk  150 feet activity   Assist Walk 150 feet activity did not occur: Safety/medical concerns  Assist level: Independent with assistive device Assistive device: Walker-rolling, Orthosis    Walk 10 feet on uneven surface  activity   Assist     Assist level: Independent  with assistive device Assistive device: Walker-rolling, Orthosis   Wheelchair     Assist Will patient use wheelchair at discharge?: No Type of Wheelchair: Manual    Wheelchair assist level: Supervision/Verbal cueing Max wheelchair distance: 55ft    Wheelchair 50 feet with 2 turns activity    Assist        Assist Level: Supervision/Verbal cueing   Wheelchair 150 feet activity     Assist     Assist Level: Supervision/Verbal cueing     Medical Problem List and Plan: 1.Decreased functional mobilitysecondary to lumbar stenosis with radiculopathy status post decompression with fusion L1-2, 3-4 and 4 -5 05/06/2018. Back brace when out of bed  -dc home today  -Patient to see Rehab MD/provider in the office for transitional care encounter in 1-2 weeks.   -surgical follow up 2. DVT Prophylaxis/Anticoagulation: Eliquis 3. Pain Management:Neurontin 300 mg bid---increased to TID  -reviewed pain/mgt plan with patoemt   Robaxin   as needed  -continue oxycodone  10mg   Prn  -continue left ankle air cast. Ice/wrap---ASO at some point? 4. Mood:Provide emotional support 5. Neuropsych: This patientiscapable of making decisions on herown behalf. 6. Skin/Wound Care:Routine skin checks 7. Fluids/Electrolytes/Nutrition:encourage PO 8.Acute blood loss anemia. hgb up to 10.2 today 9.Hypertension. Monitor with increased mobility. Continue HCTZ 25 mg daily 10.Atrial fibrillation. Cardiac rate controlled. Cardizem 180 mg daily  -observe for activity tolerance 11.Chronic diastolic congestive heart failure. Monitor for any signs of fluid overload  -weights stable 11/8  - Filed Weights   05/18/18 0450 05/19/18 0428 05/20/18 0501  Weight: 99.1 kg 99.2 kg 99.7 kg    12.Constipation. Laxative assistance 13. Leukocytosis/malodorous urine: E Coli UTI  -keflex 7 day course completed. Wbc's around 14k but steroids just completed  -wounds look great LOS: 8  days A FACE TO FACE EVALUATION WAS PERFORMED  Meredith Staggers 05/20/2018, 8:25 AM

## 2018-05-20 NOTE — Progress Notes (Signed)
Physical Therapy Session Note  Patient Details  Name: [name] MRN: [id number] Date of Birth: 06/25/1946  Today's Date: 05/19/2018 PT Individual Time: 1645-1730   45 min  Short Term Goals: Week 1:  PT Short Term Goal 1 (Week 1): =LTGs due to ELOS  Skilled Therapeutic Interventions/Progress Updates:    Pt received supine in bed and agreeable to PT. Supine>sit transfer without assist. Pt donned back brace in sitting without assist. PT instructed pt in gait training to rehab gym with supervision assist and RW. Standing balance to engage in corn hole x 10 minutes and use reacher to pick bag up from floor. Pt able to following back precautions without cues from PT. Patient returned to room and left sitting in [facility] with call bell in reach and all needs met.     Therapy Documentation Precautions:  Precautions Precautions: Back Precaution Booklet Issued: Yes (comment) Precaution Comments: pt able to state 3/3 back precautions, but does not adhere to twisting precaution despite constant cuing Required Braces or Orthoses: Spinal Brace Spinal Brace: Lumbar corset, Applied in sitting position Restrictions Weight Bearing Restrictions: No Vital Signs: Therapy Vitals Temp: 98.9 F (37.2 C) Temp Source: Oral Pulse Rate: 67 Resp: 18 BP: (!) 131/58 Patient Position (if appropriate): Lying Oxygen Therapy SpO2: 100 % O2 Device: Room Air    Therapy/Group: Individual Therapy  Lorie Phenix 05/20/2018, 7:58 AM

## 2018-05-23 ENCOUNTER — Inpatient Hospital Stay (HOSPITAL_COMMUNITY): Payer: PPO

## 2018-05-23 ENCOUNTER — Telehealth: Payer: Self-pay | Admitting: Registered Nurse

## 2018-05-23 NOTE — Telephone Encounter (Signed)
Transitional Care call  Patient name: [name]  DOB: 09/30/1947  1. Are you/is patient experiencing any problems since coming home? No a. Are there any questions regarding any aspect of care? No 2. Are there any questions regarding medications administration/dosing? No a. Are meds being taken as prescribed? Yes b. "Patient should review meds with caller to confirm"  Medication List Reviewed.  3. Have there been any falls? No 4. Has Home Health been to the house and/or have they contacted you? She has an appointment scheduled with [facility].  a. If not, have you tried to contact them? No b. Can we help you contact them? No 5. Are bowels and bladder emptying properly? Yes a. Are there any unexpected incontinence issues? No b. If applicable, is patient following bowel/bladder programs? NA 6. Any fevers, problems with breathing, unexpected pain? No 7. Are there any skin problems or new areas of breakdown? No 8. Has the patient/family member arranged specialty MD follow up (ie cardiology/neurology/renal/surgical/etc.)?  She will call for an appointment with her PCP she reports. She has an appointment scheduled with Dr. Ellene Route.  a. Can we help arrange? NA 9. Does the patient need any other services or support that we can help arrange? No 10. Are caregivers following through as expected in assisting the patient? Yes.  11. Has the patient quit smoking, drinking alcohol, or using drugs as recommended? [name] reports she doesn't drink alcohol, smoke or use illicit drugs.   Appointment date/time 05/30/2018  arrival time 8:20 for 8:40 appointment with Danella Sensing ANP. At [place]

## 2018-05-30 ENCOUNTER — Encounter: Payer: PPO | Attending: Registered Nurse | Admitting: Registered Nurse

## 2018-05-30 ENCOUNTER — Encounter: Payer: Self-pay | Admitting: Registered Nurse

## 2018-05-30 VITALS — BP 124/75 | HR 74 | Resp 14

## 2018-05-30 DIAGNOSIS — Z7901 Long term (current) use of anticoagulants: Secondary | ICD-10-CM | POA: Insufficient documentation

## 2018-05-30 DIAGNOSIS — R262 Difficulty in walking, not elsewhere classified: Secondary | ICD-10-CM | POA: Diagnosis not present

## 2018-05-30 DIAGNOSIS — I110 Hypertensive heart disease with heart failure: Secondary | ICD-10-CM | POA: Diagnosis not present

## 2018-05-30 DIAGNOSIS — M5137 Other intervertebral disc degeneration, lumbosacral region: Secondary | ICD-10-CM | POA: Diagnosis not present

## 2018-05-30 DIAGNOSIS — G8918 Other acute postprocedural pain: Secondary | ICD-10-CM | POA: Diagnosis not present

## 2018-05-30 DIAGNOSIS — I5032 Chronic diastolic (congestive) heart failure: Secondary | ICD-10-CM

## 2018-05-30 DIAGNOSIS — Z9889 Other specified postprocedural states: Secondary | ICD-10-CM

## 2018-05-30 DIAGNOSIS — Z981 Arthrodesis status: Secondary | ICD-10-CM | POA: Diagnosis not present

## 2018-05-30 DIAGNOSIS — I10 Essential (primary) hypertension: Secondary | ICD-10-CM

## 2018-05-30 DIAGNOSIS — M48061 Spinal stenosis, lumbar region without neurogenic claudication: Secondary | ICD-10-CM | POA: Diagnosis not present

## 2018-05-30 DIAGNOSIS — Z833 Family history of diabetes mellitus: Secondary | ICD-10-CM | POA: Diagnosis not present

## 2018-05-30 DIAGNOSIS — Z8249 Family history of ischemic heart disease and other diseases of the circulatory system: Secondary | ICD-10-CM | POA: Diagnosis not present

## 2018-05-30 DIAGNOSIS — R7303 Prediabetes: Secondary | ICD-10-CM | POA: Insufficient documentation

## 2018-05-30 DIAGNOSIS — M7061 Trochanteric bursitis, right hip: Secondary | ICD-10-CM | POA: Insufficient documentation

## 2018-05-30 DIAGNOSIS — M7062 Trochanteric bursitis, left hip: Secondary | ICD-10-CM | POA: Diagnosis not present

## 2018-05-30 DIAGNOSIS — I482 Chronic atrial fibrillation, unspecified: Secondary | ICD-10-CM | POA: Diagnosis not present

## 2018-05-30 DIAGNOSIS — M5416 Radiculopathy, lumbar region: Secondary | ICD-10-CM | POA: Diagnosis not present

## 2018-05-30 MED ORDER — OXYCODONE HCL 10 MG PO TABS: ORAL_TABLET | ORAL | 0 refills | Status: DC

## 2018-05-30 NOTE — Progress Notes (Signed)
Subjective:    Patient ID: [name], female    DOB: Nov 12, 1945, 72 y.o.   MRN: [id number]  HPI: [name] is a 72 year old female who is here for transitional care visit for lumbar spine decompression, lumbar radiculopathy, chronic atrial fibrillation, chronic CHF, HTN, history of cervical spine fusion, greater trochanteric bursitis and post operative pain. She has been home and will be attending therapy at [facility], first session scheduled on 06/01/2018.  She states she has pain in her lower back radiating into her right buttock, bilateral hips and left lower extremity. Ms. Cales was only taking her gabapentin twice a day, she was instructed to take her gabapentin as prescribed and if she develops daytime drowsiness to call office she and her husband verbalizes understanding. She rates her pain 7. She is walking with walker in the home.  Reports her appetite is fair.     Pain Inventory Average Pain 7 Pain Right Now 7 My pain is sharp, burning, tingling and aching  In the last 24 hours, has pain interfered with the following? General activity 10 Relation with others 10 Enjoyment of life 10 What TIME of day is your pain at its worst? all Sleep (in general) Good  Pain is worse with: walking and standing Pain improves with: medication Relief from Meds: 5  Mobility walk with assistance use a walker ability to climb steps?  yes do you drive?  yes  Function I need assistance with the following:  dressing, bathing, toileting, meal prep, household duties and shopping  Neuro/Psych weakness numbness tingling trouble walking  Prior Studies transitional care  Physicians involved in your care transitional care   Family History  Problem Relation Age of Onset  . Diabetes type II Mother   . Diabetes Sister   . Hypertension Sister   . Lung cancer Father   . Hypertension Brother   . Diabetes Brother   . Heart disease  Brother   . Diabetes Brother   . Hypertension Brother   . Heart disease Brother    Social History   Socioeconomic History  . Marital status: Married    Spouse name: Not on file  . Number of children: Not on file  . Years of education: Not on file  . Highest education level: Not on file  Occupational History  . Not on file  Social Needs  . Financial resource strain: Not on file  . Food insecurity:    Worry: Not on file    Inability: Not on file  . Transportation needs:    Medical: Not on file    Non-medical: Not on file  Tobacco Use  . Smoking status: Never Smoker  . Smokeless tobacco: Never Used  Substance and Sexual Activity  . Alcohol use: No  . Drug use: No  . Sexual activity: Not on file  Lifestyle  . Physical activity:    Days per week: Not on file    Minutes per session: Not on file  . Stress: Not on file  Relationships  . Social connections:    Talks on phone: Not on file    Gets together: Not on file    Attends religious service: Not on file    Active member of club or organization: Not on file    Attends meetings of clubs or organizations: Not on file    Relationship status: Not on file  Other Topics Concern  . Not on file  Social  History Narrative  . Not on file   Past Surgical History:  Procedure Laterality Date  . ANTERIOR CERVICAL DECOMP/DISCECTOMY FUSION  03-18-2011   C3 -- C7  . ANTERIOR LATERAL LUMBAR FUSION 4 LEVELS N/A 05/06/2018   Procedure: Lumbar one- two Lumbar two -three Lumbar  three-four Lumbar four-five Anterolateral decompression/fusion/posterior bilateral laminectomy Lumbar four-five Lumbar five Sacral one/posterior percutaneous fixation/mazor;  Surgeon: Kristeen Miss, MD;  Location: [place];  Service: Neurosurgery;  Laterality: N/A;  . APPENDECTOMY    . APPLICATION OF ROBOTIC ASSISTANCE FOR SPINAL PROCEDURE N/A 05/06/2018   Procedure: APPLICATION OF ROBOTIC ASSISTANCE FOR SPINAL PROCEDURE;  Surgeon: Kristeen Miss, MD;  Location: [place];  Service: Neurosurgery;  Laterality: N/A;  . BILATERAL CARPAL TUNNEL RELEASE     3 screws in right hand, thumb, middle finger, and index finger,   . CARDIAC CATHETERIZATION N/A 01/16/2015   Procedure: Left Heart Cath and Coronary Angiography;  Surgeon: Belva Crome, MD;  Location: [place] [facility];  Service: Cardiovascular;  Laterality: N/A;  . CHOLECYSTECTOMY OPEN  1970'S  . COLONOSCOPY  2018  . CYSTOSCOPY WITH RETROGRADE PYELOGRAM, URETEROSCOPY AND STENT PLACEMENT N/A 02/13/2013   Procedure: CYSTOSCOPY WITH RETROGRADE PYELOGRAM, URETEROSCOPY AND LITHOTRIPSY ;  Surgeon: Claybon Jabs, MD;  Location: [facility];  Service: Urology;  Laterality: N/A;  . CYSTOSCOPY/URETEROSCOPY/HOLMIUM LASER/STENT PLACEMENT Right 11/22/2017   Procedure: CYSTOSCOPY/URETEROSCOPY/RETROGRADE PYELOGRAM/HOLMIUM LASER/STENT PLACEMENT;  Surgeon: Kathie Rhodes, MD;  Location: [facility];  Service: Urology;  Laterality: Right;  . EXTRACORPOREAL SHOCK WAVE LITHOTRIPSY Right 05-16-2012  . FINGER ARTHROSCOPY WITH CARPOMETACARPEL (CMC) ARTHROPLASTY    . HOLMIUM LASER APPLICATION Right 07/19/6158   Procedure: HOLMIUM LASER APPLICATION;  Surgeon: Claybon Jabs, MD;  Location: [facility];  Service: Urology;  Laterality: Right;  . KNEE ARTHROSCOPY Bilateral   . LUMBAR LAMINECTOMY  X2  1960'S  . LUMBAR PERCUTANEOUS PEDICLE SCREW 4 LEVEL N/A 05/06/2018   Procedure: LUMBAR PERCUTANEOUS PEDICLE SCREW LUMBAR ONE TO LUMBAR FIVE WITH MAZOR ;POSTERIOR LATERAL LAMINECTOMY LUMBAR FOUR-FIVE,FIVE-SACRAL ONE;  Surgeon: Kristeen Miss, MD;  Location: [place];  Service: Neurosurgery;  Laterality: N/A;  . NASAL SEPTUM SURGERY  1970'S  . SHOULDER OPEN ROTATOR CUFF REPAIR Bilateral LEFT 01-11-2001/   RIGHT 07-21-2002  . TOTAL KNEE ARTHROPLASTY  04/13/2012   Procedure: TOTAL KNEE ARTHROPLASTY;  Surgeon: Gearlean Alf, MD;  Location: [facility];  Service: Orthopedics;  Laterality: Left;  . TOTAL KNEE  ARTHROPLASTY  08/08/2012   Procedure: TOTAL KNEE ARTHROPLASTY;  Surgeon: Gearlean Alf, MD;  Location: [facility];  Service: Orthopedics;  Laterality: Right;  . TRANSTHORACIC ECHOCARDIOGRAM  03-29-2011   MODERATE LVH/ EF 60-65%/ MILD MR  . VAGINAL HYSTERECTOMY  1960'S   Past Medical History:  Diagnosis Date  . Acute hypoxemic respiratory failure ([place]) 10/24/2016  . Arthritis    both knees  . Atrial fibrillation, chronic   . Carpal tunnel syndrome, bilateral    both hands go to sleep at times from elbows down  . Chronic diastolic heart failure ([place])   . DDD (degenerative disc disease), lumbosacral   . Dyspnea   . First degree AV block   . Hematuria 11/15/2017  . History of kidney stones   . History of rib fracture 03/2011  . Lumbar stenosis   . Obesity   . Postlaminectomy syndrome   . Pre-diabetes   . Renal calculi    BILATERAL  . Right ureteral stone   . Rotator cuff tear, left   .  Rotator cuff tear, right    BP 124/75   Pulse 74   Resp 14   SpO2 96%   Opioid Risk Score:   Fall Risk Score:  `1  Depression screen PHQ 2/9  No flowsheet data found.  Review of Systems  Constitutional: Negative.   HENT: Negative.   Respiratory: Negative.   Cardiovascular: Positive for leg swelling.  Endocrine: Negative.   Musculoskeletal: Positive for arthralgias, back pain and gait problem.  Skin: Positive for rash.  Allergic/Immunologic: Negative.   Neurological: Positive for weakness and numbness.       Tingling  All other systems reviewed and are negative.      Objective:   Physical Exam  Constitutional: She is oriented to person, place, and time. She appears well-developed and well-nourished.  HENT:  Head: Normocephalic and atraumatic.  Neck: Normal range of motion. Neck supple.  Cardiovascular: Normal rate and regular rhythm.  Pulmonary/Chest: Effort normal and breath sounds normal.  Musculoskeletal:  Normal Muscle Bulk and Muscle Testing Reveals; Upper Extremities:  Right: Decreased ROM  45 Degrees and Muscle Strength 4/5 Left: Full ROM and Muscle Strength 5/5  Lumbar Hypersensitivity Wearing Back Brace Bilateral Greater Trochanter Tenderness Lower Extremities: Right: Full ROM and Muscle Strength 5/5 Left: Decreased ROM and Muscle Strength 4/5 Arises from table slowly using walker for support Gait steady with normal steps   Neurological: She is alert and oriented to person, place, and time.  Skin: Skin is warm and dry.  Psychiatric: She has a normal mood and affect. Her behavior is normal.  Nursing note and vitals reviewed.         Assessment & Plan:  1. S/P Lumbar Spine Surgery/ Decompression: Has a scheduled appointment with Dr. Ellene Route. Outpatient Therapy begins on 06/01/2018.  2. Lumbar Radiculopathy: Increased Gabapentin TID as prescribed.  3. Chronic Atrial Fibrillation: Continue Eliquis 4. Chronic CHF: PCP Following.  5. HTN: Continue current medication regimen  6. Bilateral Greater Trochanteric Bursitis: Alternate Ice and Heat Therapy. Continue to Monitor.  7. Post Operative Pain: RX: Oxycodone one tablet every 4- 6 hours as needed for pain #100.  We will continue the opioid monitoring program, this consists of regular clinic visits, examinations, urine drug screen, pill counts as well as use of [place] Controlled Substance Reporting system. 8. History of fusion of cervical spine: Continue to Monitor: Dr. Ellene Route is following.   30 minutes of face to face patient care time was spent during this visit. All questions were encouraged and answered.

## 2018-06-01 ENCOUNTER — Ambulatory Visit: Payer: PPO | Attending: Physical Medicine & Rehabilitation | Admitting: Physical Therapy

## 2018-06-01 ENCOUNTER — Encounter: Payer: Self-pay | Admitting: Physical Therapy

## 2018-06-01 DIAGNOSIS — R262 Difficulty in walking, not elsewhere classified: Secondary | ICD-10-CM | POA: Insufficient documentation

## 2018-06-01 DIAGNOSIS — M6281 Muscle weakness (generalized): Secondary | ICD-10-CM

## 2018-06-01 DIAGNOSIS — M5442 Lumbago with sciatica, left side: Secondary | ICD-10-CM

## 2018-06-01 NOTE — Therapy (Signed)
[facility] [place], [place], [address] Phone: [phone]   Fax:  [phone]  Physical Therapy Evaluation  Patient Details  Name: [name] MRN: [id number] Date of Birth: 1946/06/07 Referring Provider (PT): Elsner   Encounter Date: 06/01/2018  PT End of Session - 06/01/18 1520    Visit Number  1    Date for PT Re-Evaluation  08/01/18    PT Start Time  1355    PT Stop Time  1440    PT Time Calculation (min)  45 min    Equipment Utilized During Treatment  Back brace    Activity Tolerance  Patient tolerated treatment well    Behavior During Therapy  Anxious       Past Medical History:  Diagnosis Date  . Acute hypoxemic respiratory failure ([place]) 10/24/2016  . Arthritis    both knees  . Atrial fibrillation, chronic   . Carpal tunnel syndrome, bilateral    both hands go to sleep at times from elbows down  . Chronic diastolic heart failure ([place])   . DDD (degenerative disc disease), lumbosacral   . Dyspnea   . First degree AV block   . Hematuria 11/15/2017  . History of kidney stones   . History of rib fracture 03/2011  . Lumbar stenosis   . Obesity   . Postlaminectomy syndrome   . Pre-diabetes   . Renal calculi    BILATERAL  . Right ureteral stone   . Rotator cuff tear, left   . Rotator cuff tear, right     Past Surgical History:  Procedure Laterality Date  . ANTERIOR CERVICAL DECOMP/DISCECTOMY FUSION  03-18-2011   C3 -- C7  . ANTERIOR LATERAL LUMBAR FUSION 4 LEVELS N/A 05/06/2018   Procedure: Lumbar one- two Lumbar two -three Lumbar  three-four Lumbar four-five Anterolateral decompression/fusion/posterior bilateral laminectomy Lumbar four-five Lumbar five Sacral one/posterior percutaneous fixation/mazor;  Surgeon: Kristeen Miss, MD;  Location: [facility];  Service: Neurosurgery;  Laterality: N/A;  . APPENDECTOMY    . APPLICATION OF ROBOTIC ASSISTANCE FOR SPINAL PROCEDURE N/A 05/06/2018    Procedure: APPLICATION OF ROBOTIC ASSISTANCE FOR SPINAL PROCEDURE;  Surgeon: Kristeen Miss, MD;  Location: [place];  Service: Neurosurgery;  Laterality: N/A;  . BILATERAL CARPAL TUNNEL RELEASE     3 screws in right hand, thumb, middle finger, and index finger,   . CARDIAC CATHETERIZATION N/A 01/16/2015   Procedure: Left Heart Cath and Coronary Angiography;  Surgeon: Belva Crome, MD;  Location: [place] [facility];  Service: Cardiovascular;  Laterality: N/A;  . CHOLECYSTECTOMY OPEN  1970'S  . COLONOSCOPY  2018  . CYSTOSCOPY WITH RETROGRADE PYELOGRAM, URETEROSCOPY AND STENT PLACEMENT N/A 02/13/2013   Procedure: CYSTOSCOPY WITH RETROGRADE PYELOGRAM, URETEROSCOPY AND LITHOTRIPSY ;  Surgeon: Claybon Jabs, MD;  Location[facility];  Service: Urology;  Laterality: N/A;  . CYSTOSCOPY/URETEROSCOPY/HOLMIUM LASER/STENT PLACEMENT Right 11/22/2017   Procedure: CYSTOSCOPY/URETEROSCOPY/RETROGRADE PYELOGRAM/HOLMIUM LASER/STENT PLACEMENT;  Surgeon: Kathie Rhodes, MD;  Location: [facility];  Service: Urology;  Laterality: Right;  . EXTRACORPOREAL SHOCK WAVE LITHOTRIPSY Right 05-16-2012  . FINGER ARTHROSCOPY WITH CARPOMETACARPEL (CMC) ARTHROPLASTY    . HOLMIUM LASER APPLICATION Right 01/17/2422   Procedure: HOLMIUM LASER APPLICATION;  Surgeon: Claybon Jabs, MD;  Location: [facility];  Service: Urology;  Laterality: Right;  . KNEE ARTHROSCOPY Bilateral   . LUMBAR LAMINECTOMY  X2  1960'S  . LUMBAR PERCUTANEOUS PEDICLE SCREW 4 LEVEL N/A 05/06/2018  Procedure: LUMBAR PERCUTANEOUS PEDICLE SCREW LUMBAR ONE TO LUMBAR FIVE WITH MAZOR ;POSTERIOR LATERAL LAMINECTOMY LUMBAR FOUR-FIVE,FIVE-SACRAL ONE;  Surgeon: Kristeen Miss, MD;  Location: [place];  Service: Neurosurgery;  Laterality: N/A;  . NASAL SEPTUM SURGERY  1970'S  . SHOULDER OPEN ROTATOR CUFF REPAIR Bilateral LEFT 01-11-2001/   RIGHT 07-21-2002  . TOTAL KNEE ARTHROPLASTY  04/13/2012   Procedure: TOTAL KNEE ARTHROPLASTY;   Surgeon: Gearlean Alf, MD;  [facility];  Service: Orthopedics;  Laterality: Left;  . TOTAL KNEE ARTHROPLASTY  08/08/2012   Procedure: TOTAL KNEE ARTHROPLASTY;  Surgeon: Gearlean Alf, MD;  [facility];  Service: Orthopedics;  Laterality: Right;  . TRANSTHORACIC ECHOCARDIOGRAM  03-29-2011   MODERATE LVH/ EF 60-65%/ MILD MR  . VAGINAL HYSTERECTOMY  1960'S    There were no vitals filed for this visit.   Subjective Assessment - 06/01/18 1401    Subjective  Patient underwent a 4 level lumbar fusion 05/06/18.  She was in the hospital for 15 days for rehab.  She reports that she was getting 2x a day therapy and feels like she was doing well    Pertinent History  degenerative scoliosis, previous lumbar surgeries, cervcal fusion, Bil TKR, a-fib    Limitations  Walking;Standing    Patient Stated Goals  have less pain and better function    Currently in Pain?  Yes    Pain Score  4     Pain Location  Back    Pain Orientation  Lower    Pain Descriptors / Indicators  Aching;Sore    Pain Type  Surgical pain;Acute pain    Pain Radiating Towards  left leg    Pain Onset  More than a month ago    Pain Frequency  Constant    Aggravating Factors   any actiivity and then without pain meds, pain can be up to 8/10    Pain Relieving Factors  Pain meds really ease the pain down to a 2/10    Effect of Pain on Daily Activities  all ADL's are limited, difficulty walking         [facility] PT Assessment - 06/01/18 0001      Assessment   Medical Diagnosis  S/P lumbar fusion 4 levels    Referring Provider (PT)  Elsner    Onset Date/Surgical Date  05/06/18    Prior Therapy  yes for the low earlier in the year      Precautions   Precautions  Back    Precaution Comments  no bending > 30 degrees in any directions, no lifting, no twisting      Balance Screen   Has the patient fallen in the past 6 months  No    Has the patient had a decrease in activity level because of a fear of falling?   No     Is the patient reluctant to leave their home because of a fear of falling?   No      Home Environment   Additional Comments  single level, she would like to be able to do some cooking and cleaning      Prior Function   Level of Independence  Needs assistance with ADLs;Independent with household mobility with device    Dressing  Minimal    Vocation  Retired    Comments  no exercise      Strength   Right Hip Flexion  3+/5    Right Hip Extension  2+/5    Right Hip  ABduction  3+/5    Right Hip ADduction  3+/5    Left Hip Flexion  3/5    Left Hip Extension  2+/5    Left Hip ABduction  3+/5    Left Hip ADduction  3+/5    Right Knee Flexion  3+/5    Right Knee Extension  3+/5    Left Knee Flexion  4-/5    Left Knee Extension  4-/5      Palpation   Palpation comment  The scars are well healed, she is tender and tight in the lumbar area, she is very tender in the bilateral buttocks and the left ischial tuberosity      Ambulation/Gait   Gait Comments  gait is with FWW, slow pace, stooped posture      Standardized Balance Assessment   Standardized Balance Assessment  Timed Up and Go Test      Timed Up and Go Test   Normal TUG (seconds)  37                Objective measurements completed on examination: See above findings.      [facility] PT Treatment/Exercise - 06/01/18 0001      Lumbar Exercises: Aerobic   Nustep  level 3 x 7 minutes             PT Education - 06/01/18 1519    Education Details  seated LAQ, marches, hip abduction, toe taps and heel raises    Methods  Explanation;Demonstration;Handout    Comprehension  Verbalized understanding       PT Short Term Goals - 06/01/18 1535      PT SHORT TERM GOAL #1   Title  I with HEP    Time  2    Period  Weeks    Status  New        PT Long Term Goals - 06/01/18 1535      PT LONG TERM GOAL #1   Title  I with advanced HEP    Time  8    Period  Weeks    Status  New      PT LONG TERM GOAL #2    Title  Patient able to bridge in bed to improve bed mobility.    Time  8    Period  Weeks    Status  New      PT LONG TERM GOAL #3   Title  Patient to demo improved BLE strength to 4/5 or better to normalize gait and ADLS    Time  8    Period  Weeks    Status  New      PT LONG TERM GOAL #4   Title  Patient to report decreased pain with ADLs by 50%.    Time  8    Period  Weeks    Status  New      PT LONG TERM GOAL #5   Title  Patient able to stand and walk for 15 min or more without needing to sit due to pain.    Time  8    Period  Weeks    Status  New             Plan - 06/01/18 1521    Clinical Impression Statement  Patient underwent a 4 level lumbar fustion 05/06/18.  She was in the hospital for rehab for 15 days.  She reports that she has a lot of difficulty with mobility and  self care, uses a FWW for locomotion, her TUG was 37 seconds, she needs assist at times to get up from sitting, she has weakness in the LE's.  She is needing help with her ADL's, bathing and dressing, her husband is helping with all of this.  She has a back brace on and is not to do any bending lifting or twisting    History and Personal Factors relevant to plan of care:  2 previous lumbar surgeries, scoliosis, cervical fusion, Bilateral TKA, A-fib, shoulder issues    Clinical Presentation  Evolving    Clinical Decision Making  Moderate    Rehab Potential  Fair    PT Frequency  2x / week    PT Duration  8 weeks    PT Treatment/Interventions  ADLs/Self Care Home Management;Cryotherapy;Air traffic controller;Therapeutic exercise;Balance training;Neuromuscular re-education;Patient/family education;Manual techniques;Passive range of motion;Dry needling;Therapeutic activities;Functional mobility training;Stair training    PT Next Visit Plan  Slowly start to work functional strength and mobility    Consulted and Agree with Plan of Care  Patient       Patient will  benefit from skilled therapeutic intervention in order to improve the following deficits and impairments:  Abnormal gait, Decreased range of motion, Difficulty walking, Decreased activity tolerance, Pain, Decreased balance, Decreased strength, Postural dysfunction, Impaired flexibility, Decreased safety awareness, Increased muscle spasms, Decreased endurance, Decreased mobility, Improper body mechanics  Visit Diagnosis: Acute bilateral low back pain with left-sided sciatica - Plan: PT plan of care cert/re-cert  Muscle weakness (generalized) - Plan: PT plan of care cert/re-cert  Difficulty in walking, not elsewhere classified - Plan: PT plan of care cert/re-cert     Problem List Patient Active Problem List   Diagnosis Date Noted  . Lumbar radiculopathy 05/12/2018  . 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 Neurologic gait disorder   . Chronic atrial fibrillation   . Chronic diastolic congestive heart failure ([place])   . Prediabetes   . Post-operative pain   . History of fusion of cervical spine   . Acute blood loss anemia   . Leukocytosis   . Degenerative scoliosis 05/06/2018  . Scoliosis deformity of spine 05/06/2018  . Respiratory failure ([place]) 10/24/2016  . Anxiety 09/26/2016  . Paroxysmal atrial fibrillation ([place]) 12/20/2014  . Chronic anticoagulation 12/20/2014  . Essential hypertension 07/20/2014  . Obesity 05/04/2014  . Chronic diastolic heart failure ([place]) 05/03/2014  . Ureteral calculus, right 05/16/2012  . Hydronephrosis of right kidney 05/16/2012  . Renal calculus, left 05/16/2012  . OA (osteoarthritis) of knee 04/13/2012    Sumner Boast., PT 06/01/2018, 3:39 PM  [place], [place], [address] Phone: [phone]   Fax:  [phone]  Name: [name] MRN: [id number] Date of Birth: [birth year]

## 2018-06-03 ENCOUNTER — Encounter: Payer: PPO | Admitting: Registered Nurse

## 2018-06-03 DIAGNOSIS — N200 Calculus of kidney: Secondary | ICD-10-CM | POA: Diagnosis not present

## 2018-06-06 ENCOUNTER — Telehealth: Payer: Self-pay | Admitting: *Deleted

## 2018-06-06 MED ORDER — GABAPENTIN 100 MG PO CAPS: ORAL_CAPSULE | ORAL | 1 refills | Status: DC

## 2018-06-06 NOTE — Telephone Encounter (Signed)
Return [name] call, we discussed her medication. Gabapentin 300 mg causing daytime drowsiness with morning and afternoon dosages, we will change her dose to 100 mg twice a day and she was instructed to continue the 300 mg at bedtime, due to no daytime drowsiness in the morning. She verbalizes understanding.

## 2018-06-06 NOTE — Telephone Encounter (Signed)
[name] called and is requesting a call back from [place] who she said told her to call this week to discuss her medications.

## 2018-06-07 ENCOUNTER — Encounter: Payer: Self-pay | Admitting: Physical Therapy

## 2018-06-07 ENCOUNTER — Ambulatory Visit: Payer: PPO | Admitting: Physical Therapy

## 2018-06-07 DIAGNOSIS — R262 Difficulty in walking, not elsewhere classified: Secondary | ICD-10-CM

## 2018-06-07 DIAGNOSIS — M5442 Lumbago with sciatica, left side: Secondary | ICD-10-CM | POA: Diagnosis not present

## 2018-06-07 DIAGNOSIS — M6281 Muscle weakness (generalized): Secondary | ICD-10-CM

## 2018-06-07 NOTE — Therapy (Signed)
[place] [address] [place], [place], [address] Phone: [phone]   Fax:  [phone]  Physical Therapy Treatment  Patient Details  Name: [name] MRN: [id number] Date of Birth: Jul 15, 1945 Referring Provider (PT): Elsner   Encounter Date: 06/07/2018  PT End of Session - 06/07/18 1513    Visit Number  2    Date for PT Re-Evaluation  08/01/18    PT Start Time  1440    PT Stop Time  1530    PT Time Calculation (min)  50 min    Equipment Utilized During Treatment  Back brace    Activity Tolerance  Patient limited by pain    Behavior During Therapy  Anxious       Past Medical History:  Diagnosis Date  . Acute hypoxemic respiratory failure ([place]) 10/24/2016  . Arthritis    both knees  . Atrial fibrillation, chronic   . Carpal tunnel syndrome, bilateral    both hands go to sleep at times from elbows down  . Chronic diastolic heart failure ([place])   . DDD (degenerative disc disease), lumbosacral   . Dyspnea   . First degree AV block   . Hematuria 11/15/2017  . History of kidney stones   . History of rib fracture 03/2011  . Lumbar stenosis   . Obesity   . Postlaminectomy syndrome   . Pre-diabetes   . Renal calculi    BILATERAL  . Right ureteral stone   . Rotator cuff tear, left   . Rotator cuff tear, right     Past Surgical History:  Procedure Laterality Date  . ANTERIOR CERVICAL DECOMP/DISCECTOMY FUSION  03-18-2011   C3 -- C7  . ANTERIOR LATERAL LUMBAR FUSION 4 LEVELS N/A 05/06/2018   Procedure: Lumbar one- two Lumbar two -three Lumbar  three-four Lumbar four-five Anterolateral decompression/fusion/posterior bilateral laminectomy Lumbar four-five Lumbar five Sacral one/posterior percutaneous fixation/mazor;  Surgeon: Kristeen Miss, MD;  Location: [facility];  Service: Neurosurgery;  Laterality: N/A;  . APPENDECTOMY    . APPLICATION OF ROBOTIC ASSISTANCE FOR SPINAL PROCEDURE N/A 05/06/2018    Procedure: APPLICATION OF ROBOTIC ASSISTANCE FOR SPINAL PROCEDURE;  Surgeon: Kristeen Miss, MD;  Location: [facility];  Service: Neurosurgery;  Laterality: N/A;  . BILATERAL CARPAL TUNNEL RELEASE     3 screws in right hand, thumb, middle finger, and index finger,   . CARDIAC CATHETERIZATION N/A 01/16/2015   Procedure: Left Heart Cath and Coronary Angiography;  Surgeon: Belva Crome, MD;  Location: [facility];  Service: Cardiovascular;  Laterality: N/A;  . CHOLECYSTECTOMY OPEN  1970'S  . COLONOSCOPY  2018  . CYSTOSCOPY WITH RETROGRADE PYELOGRAM, URETEROSCOPY AND STENT PLACEMENT N/A 02/13/2013   Procedure: CYSTOSCOPY WITH RETROGRADE PYELOGRAM, URETEROSCOPY AND LITHOTRIPSY ;  Surgeon: Claybon Jabs, MD;  Location: [facility];  Service: Urology;  Laterality: N/A;  . CYSTOSCOPY/URETEROSCOPY/HOLMIUM LASER/STENT PLACEMENT Right 11/22/2017   Procedure: CYSTOSCOPY/URETEROSCOPY/RETROGRADE PYELOGRAM/HOLMIUM LASER/STENT PLACEMENT;  Surgeon: Kathie Rhodes, MD;  Location: [facility];  Service: Urology;  Laterality: Right;  . EXTRACORPOREAL SHOCK WAVE LITHOTRIPSY Right 05-16-2012  . FINGER ARTHROSCOPY WITH CARPOMETACARPEL (CMC) ARTHROPLASTY    . HOLMIUM LASER APPLICATION Right 09/14/1935   Procedure: HOLMIUM LASER APPLICATION;  Surgeon: Claybon Jabs, MD;  Location: [facility];  Service: Urology;  Laterality: Right;  . KNEE ARTHROSCOPY Bilateral   . LUMBAR LAMINECTOMY  X2  1960'S  . LUMBAR PERCUTANEOUS PEDICLE SCREW 4 LEVEL N/A 05/06/2018  Procedure: LUMBAR PERCUTANEOUS PEDICLE SCREW LUMBAR ONE TO LUMBAR FIVE WITH MAZOR ;POSTERIOR LATERAL LAMINECTOMY LUMBAR FOUR-FIVE,FIVE-SACRAL ONE;  Surgeon: Kristeen Miss, MD;  Location: [facility];  Service: Neurosurgery;  Laterality: N/A;  . NASAL SEPTUM SURGERY  1970'S  . SHOULDER OPEN ROTATOR CUFF REPAIR Bilateral LEFT 01-11-2001/   RIGHT 07-21-2002  . TOTAL KNEE ARTHROPLASTY  04/13/2012   Procedure: TOTAL KNEE ARTHROPLASTY;   Surgeon: Gearlean Alf, MD;  Location: [facility];  Service: Orthopedics;  Laterality: Left;  . TOTAL KNEE ARTHROPLASTY  08/08/2012   Procedure: TOTAL KNEE ARTHROPLASTY;  Surgeon: Gearlean Alf, MD;  Location: [facility];  Service: Orthopedics;  Laterality: Right;  . TRANSTHORACIC ECHOCARDIOGRAM  03-29-2011   MODERATE LVH/ EF 60-65%/ MILD MR  . VAGINAL HYSTERECTOMY  1960'S    There were no vitals filed for this visit.  Subjective Assessment - 06/07/18 1447    Subjective  Patient comes in today and reports that she is in much more pain, talks about pain in her thighs, She and her husband tell me that they are changing the gabapentin, it helps with pain but reports that too much knocks her out., she does talk about tenderness in teh shins as well    Currently in Pain?  Yes    Pain Score  8     Pain Location  Leg    Pain Orientation  Right;Left;Upper;Lower    Pain Descriptors / Indicators  Aching;Tender                       [facility] Adult PT Treatment/Exercise - 06/07/18 0001      Ambulation/Gait   Gait Comments  gait with FWW, working on holding posture, verbal and tactile cues needed and trying to walk > 60 feet.  Husband was wheeling her in a w/c today      Lumbar Exercises: Aerobic   Nustep  level 2 x 7 minutes      Lumbar Exercises: Seated   Long Arc Quad on Chair  Both;2 sets;10 reps    Other Seated Lumbar Exercises  seated marches    Other Seated Lumbar Exercises  toe raises and heel raises, ball b/n knees squeeze, isometric hip abduction      Modalities   Modalities  Cryotherapy;Electrical Stimulation      Cryotherapy   Number Minutes Cryotherapy  15 Minutes    Cryotherapy Location  Hip    Type of Cryotherapy  Ice pack      Electrical Stimulation   Electrical Stimulation Location  bilateral thighs    Electrical Stimulation Action  premod    Electrical Stimulation Parameters  sitting     Electrical Stimulation Goals  Pain               PT Short  Term Goals - 06/01/18 1535      PT SHORT TERM GOAL #1   Title  I with HEP    Time  2    Period  Weeks    Status  New        PT Long Term Goals - 06/01/18 1535      PT LONG TERM GOAL #1   Title  I with advanced HEP    Time  8    Period  Weeks    Status  New      PT LONG TERM GOAL #2   Title  Patient able to bridge in bed to improve bed mobility.    Time  8  Period  Weeks    Status  New      PT LONG TERM GOAL #3   Title  Patient to demo improved BLE strength to 4/5 or better to normalize gait and ADLS    Time  8    Period  Weeks    Status  New      PT LONG TERM GOAL #4   Title  Patient to report decreased pain with ADLs by 50%.    Time  8    Period  Weeks    Status  New      PT LONG TERM GOAL #5   Title  Patient able to stand and walk for 15 min or more without needing to sit due to pain.    Time  8    Period  Weeks    Status  New            Plan - 06/07/18 1514    Clinical Impression Statement  Patient comes in today in a w/c, she reports that she is in much more pain, c/o pain in the anterior thighs, pain rated an 8/10.  She talks about problems with medication and trying to change it to take 3 x /day and the times that she has not had the medication she is in a lot of pain but all of pain is in the thighs.    PT Next Visit Plan  Slowly start to work functional strength and mobility    Consulted and Agree with Plan of Care  Patient       Patient will benefit from skilled therapeutic intervention in order to improve the following deficits and impairments:  Abnormal gait, Decreased range of motion, Difficulty walking, Decreased activity tolerance, Pain, Decreased balance, Decreased strength, Postural dysfunction, Impaired flexibility, Decreased safety awareness, Increased muscle spasms, Decreased endurance, Decreased mobility, Improper body mechanics  Visit Diagnosis: Acute bilateral low back pain with left-sided sciatica  Muscle weakness  (generalized)  Difficulty in walking, not elsewhere classified     Problem List Patient Active Problem List   Diagnosis Date Noted  . Lumbar radiculopathy 05/12/2018  . 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 Neurologic gait disorder   . Chronic atrial fibrillation   . Chronic diastolic congestive heart failure ([place])   . Prediabetes   . Post-operative pain   . History of fusion of cervical spine   . Acute blood loss anemia   . Leukocytosis   . Degenerative scoliosis 05/06/2018  . Scoliosis deformity of spine 05/06/2018  . Respiratory failure ([place]) 10/24/2016  . Anxiety 09/26/2016  . Paroxysmal atrial fibrillation ([place]) 12/20/2014  . Chronic anticoagulation 12/20/2014  . Essential hypertension 07/20/2014  . Obesity 05/04/2014  . Chronic diastolic heart failure ([place]) 05/03/2014  . Ureteral calculus, right 05/16/2012  . Hydronephrosis of right kidney 05/16/2012  . Renal calculus, left 05/16/2012  . OA (osteoarthritis) of knee 04/13/2012    Sumner Boast., PT 06/07/2018, 3:16 PM  [address] [place], [place], [address] Phone: [phone]   Fax:  [phone]  Name: ELOISA CHOKSHI MRN: [id number] Date of Birth: [birth year]

## 2018-06-08 DIAGNOSIS — M48061 Spinal stenosis, lumbar region without neurogenic claudication: Secondary | ICD-10-CM | POA: Diagnosis not present

## 2018-06-13 ENCOUNTER — Ambulatory Visit: Payer: PPO | Admitting: Physical Therapy

## 2018-06-14 DIAGNOSIS — G8918 Other acute postprocedural pain: Secondary | ICD-10-CM | POA: Diagnosis not present

## 2018-06-14 DIAGNOSIS — N390 Urinary tract infection, site not specified: Secondary | ICD-10-CM | POA: Diagnosis not present

## 2018-06-14 DIAGNOSIS — M79605 Pain in left leg: Secondary | ICD-10-CM | POA: Diagnosis not present

## 2018-06-16 DIAGNOSIS — R8279 Other abnormal findings on microbiological examination of urine: Secondary | ICD-10-CM | POA: Diagnosis not present

## 2018-06-16 DIAGNOSIS — R1084 Generalized abdominal pain: Secondary | ICD-10-CM | POA: Diagnosis not present

## 2018-06-16 DIAGNOSIS — N200 Calculus of kidney: Secondary | ICD-10-CM | POA: Diagnosis not present

## 2018-07-05 ENCOUNTER — Encounter: Payer: Self-pay | Admitting: Physical Therapy

## 2018-07-05 ENCOUNTER — Ambulatory Visit: Payer: PPO | Attending: Neurological Surgery | Admitting: Physical Therapy

## 2018-07-05 DIAGNOSIS — M6281 Muscle weakness (generalized): Secondary | ICD-10-CM

## 2018-07-05 DIAGNOSIS — R262 Difficulty in walking, not elsewhere classified: Secondary | ICD-10-CM

## 2018-07-05 DIAGNOSIS — M5442 Lumbago with sciatica, left side: Secondary | ICD-10-CM | POA: Diagnosis not present

## 2018-07-05 NOTE — Therapy (Signed)
[place] [place], [place], [address] Phone: [phone]   Fax:  [phone]  Physical Therapy Treatment  Patient Details  Name: [name] MRN: [id number] Date of Birth: [birth year] Referring Provider (PT): Elsner   Encounter Date: 07/05/2018  PT End of Session - 07/05/18 1002    Visit Number  3    Date for PT Re-Evaluation  08/01/18    PT Start Time  0920    PT Stop Time  1000    PT Time Calculation (min)  40 min    Activity Tolerance  Patient limited by pain    Behavior During Therapy  Anxious       Past Medical History:  Diagnosis Date  . Acute hypoxemic respiratory failure ([place]) 10/24/2016  . Arthritis    both knees  . Atrial fibrillation, chronic   . Carpal tunnel syndrome, bilateral    both hands go to sleep at times from elbows down  . Chronic diastolic heart failure ([place])   . DDD (degenerative disc disease), lumbosacral   . Dyspnea   . First degree AV block   . Hematuria 11/15/2017  . History of kidney stones   . History of rib fracture 03/2011  . Lumbar stenosis   . Obesity   . Postlaminectomy syndrome   . Pre-diabetes   . Renal calculi    BILATERAL  . Right ureteral stone   . Rotator cuff tear, left   . Rotator cuff tear, right     Past Surgical History:  Procedure Laterality Date  . ANTERIOR CERVICAL DECOMP/DISCECTOMY FUSION  03-18-2011   C3 -- C7  . ANTERIOR LATERAL LUMBAR FUSION 4 LEVELS N/A 05/06/2018   Procedure: Lumbar one- two Lumbar two -three Lumbar  three-four Lumbar four-five Anterolateral decompression/fusion/posterior bilateral laminectomy Lumbar four-five Lumbar five Sacral one/posterior percutaneous fixation/mazor;  Surgeon: Kristeen Miss, MD;  Location: [facility];  Service: Neurosurgery;  Laterality: N/A;  . APPENDECTOMY    . APPLICATION OF ROBOTIC ASSISTANCE FOR SPINAL PROCEDURE N/A 05/06/2018   Procedure: APPLICATION OF ROBOTIC ASSISTANCE FOR SPINAL  PROCEDURE;  Surgeon: Kristeen Miss, MD;  Location: [facility];  Service: Neurosurgery;  Laterality: N/A;  . BILATERAL CARPAL TUNNEL RELEASE     3 screws in right hand, thumb, middle finger, and index finger,   . CARDIAC CATHETERIZATION N/A 01/16/2015   Procedure: Left Heart Cath and Coronary Angiography;  Surgeon: Belva Crome, MD;  Location: [facility];  Service: Cardiovascular;  Laterality: N/A;  . CHOLECYSTECTOMY OPEN  1970'S  . COLONOSCOPY  2018  . CYSTOSCOPY WITH RETROGRADE PYELOGRAM, URETEROSCOPY AND STENT PLACEMENT N/A 02/13/2013   Procedure: CYSTOSCOPY WITH RETROGRADE PYELOGRAM, URETEROSCOPY AND LITHOTRIPSY ;  Surgeon: Claybon Jabs, MD;  Location: [facility];  Service: Urology;  Laterality: N/A;  . CYSTOSCOPY/URETEROSCOPY/HOLMIUM LASER/STENT PLACEMENT Right 11/22/2017   Procedure: CYSTOSCOPY/URETEROSCOPY/RETROGRADE PYELOGRAM/HOLMIUM LASER/STENT PLACEMENT;  Surgeon: Kathie Rhodes, MD;  Location: [facility];  Service: Urology;  Laterality: Right;  . EXTRACORPOREAL SHOCK WAVE LITHOTRIPSY Right 05-16-2012  . FINGER ARTHROSCOPY WITH CARPOMETACARPEL (CMC) ARTHROPLASTY    . HOLMIUM LASER APPLICATION Right 07/22/5091   Procedure: HOLMIUM LASER APPLICATION;  Surgeon: Claybon Jabs, MD;  Location: [facility];  Service: Urology;  Laterality: Right;  . KNEE ARTHROSCOPY Bilateral   . LUMBAR LAMINECTOMY  X2  1960'S  . LUMBAR PERCUTANEOUS PEDICLE SCREW 4 LEVEL N/A 05/06/2018   Procedure: LUMBAR PERCUTANEOUS PEDICLE SCREW LUMBAR ONE TO  LUMBAR FIVE WITH MAZOR ;POSTERIOR LATERAL LAMINECTOMY LUMBAR FOUR-FIVE,FIVE-SACRAL ONE;  Surgeon: Kristeen Miss, MD;  Location: [facility];  Service: Neurosurgery;  Laterality: N/A;  . NASAL SEPTUM SURGERY  1970'S  . SHOULDER OPEN ROTATOR CUFF REPAIR Bilateral LEFT 01-11-2001/   RIGHT 07-21-2002  . TOTAL KNEE ARTHROPLASTY  04/13/2012   Procedure: TOTAL KNEE ARTHROPLASTY;  Surgeon: Gearlean Alf, MD;  [facility];  Service:  Orthopedics;  Laterality: Left;  . TOTAL KNEE ARTHROPLASTY  08/08/2012   Procedure: TOTAL KNEE ARTHROPLASTY;  Surgeon: Gearlean Alf, MD;  [facility];  Service: Orthopedics;  Laterality: Right;  . TRANSTHORACIC ECHOCARDIOGRAM  03-29-2011   MODERATE LVH/ EF 60-65%/ MILD MR  . VAGINAL HYSTERECTOMY  1960'S    There were no vitals filed for this visit.  Subjective Assessment - 07/05/18 0918    Subjective  Patient returns after 4 weeks, she and her husband report that she has had increased leg pain, saw the MD and he feels that she needs to get moving.  She reports that she has been trying to get off the gabapentin and the oxycodone, which is why she reports that she did not return due to feeling "out of it"    Currently in Pain?  Yes    Pain Score  6     Pain Location  Leg    Pain Orientation  Right;Left;Upper;Lower    Pain Descriptors / Indicators  Aching    Aggravating Factors   any activity pain an 8/10    Pain Relieving Factors  pain meds, rest                       [facility] Adult PT Treatment/Exercise - 07/05/18 0001      Ambulation/Gait   Gait Comments  with HHA really trying to get her to take normal steps,has very bad ataxic gait throws out the legs and "wobbles", did some side stepping with HHA, used SPC 60'x2 and 200'x 1 cues for posture and for smooth gait      Lumbar Exercises: Aerobic   Nustep  level 2 x 7 minutes      Lumbar Exercises: Seated   Long Arc Quad on Chair  Both;2 sets;10 reps    Other Seated Lumbar Exercises  seated marches, red tband scap stabilization, ball in lap isometric abdominals, red tband HS curls    Other Seated Lumbar Exercises  toe raises and heel raises, ball b/n knees squeeze, isometric hip abduction               PT Short Term Goals - 07/05/18 1005      PT SHORT TERM GOAL #1   Status  On-going        PT Long Term Goals - 07/05/18 1005      PT LONG TERM GOAL #1   Title  I with advanced HEP    Status   On-going      PT LONG TERM GOAL #2   Title  Patient able to bridge in bed to improve bed mobility.    Status  On-going      PT LONG TERM GOAL #3   Title  Patient to demo improved BLE strength to 4/5 or better to normalize gait and ADLS    Status  On-going      PT LONG TERM GOAL #4   Title  Patient to report decreased pain with ADLs by 50%.    Status  On-going  PT LONG TERM GOAL #5   Title  Patient able to stand and walk for 15 min or more without needing to sit due to pain.    Status  On-going            Plan - 07/05/18 1002    Clinical Impression Statement  Patient has not been in in a month, she reports medication issues, she reports that she saw the MD and he encouraged her to return to PT.  She has significant issues with ataxic LE use while walking, throws the legs out and has a wobble significant weakness on the left LE, she also needs a lot of cues to do exercises correctly as she tends to throw the legs out and use momentum to get them to move    PT Next Visit Plan  really need to get her stronger and moving    Consulted and Agree with Plan of Care  Patient       Patient will benefit from skilled therapeutic intervention in order to improve the following deficits and impairments:  Abnormal gait, Decreased range of motion, Difficulty walking, Decreased activity tolerance, Pain, Decreased balance, Decreased strength, Postural dysfunction, Impaired flexibility, Decreased safety awareness, Increased muscle spasms, Decreased endurance, Decreased mobility, Improper body mechanics  Visit Diagnosis: Acute bilateral low back pain with left-sided sciatica  Muscle weakness (generalized)  Difficulty in walking, not elsewhere classified     Problem List Patient Active Problem List   Diagnosis Date Noted  . Lumbar radiculopathy 05/12/2018  . 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)   . Neurologic gait disorder   . Chronic atrial fibrillation   . Chronic diastolic congestive heart failure  ([place])   . Prediabetes   . Post-operative pain   . History of fusion of cervical spine   . Acute blood loss anemia   . Leukocytosis   . Degenerative scoliosis 05/06/2018  . Scoliosis deformity of spine 05/06/2018  . Respiratory failure ([place]) 10/24/2016  . Anxiety 09/26/2016  . Paroxysmal atrial fibrillation ([place]) 12/20/2014  . Chronic anticoagulation 12/20/2014  . Essential hypertension 07/20/2014  . Obesity 05/04/2014  . Chronic diastolic heart failure ([place]) 05/03/2014  . Ureteral calculus, right 05/16/2012  . Hydronephrosis of right kidney 05/16/2012  . Renal calculus, left 05/16/2012  . OA (osteoarthritis) of knee 04/13/2012    Sumner Boast., PT 07/05/2018, 10:05 AM  [address] [place], [place], [address] Phone: [phone]   Fax:  [phone]  Name: [name] MRN: [id number] Date of Birth: 1946/03/22

## 2018-07-11 ENCOUNTER — Telehealth: Payer: Self-pay

## 2018-07-11 ENCOUNTER — Encounter: Payer: PPO | Admitting: Physical Medicine & Rehabilitation

## 2018-07-11 NOTE — Telephone Encounter (Signed)
Pt brought by Pt Asst forms for Eliquis. I helped her fill out her part. I have placed our part in Dr Darliss Ridgel mailbox for signature.

## 2018-07-12 DIAGNOSIS — R8279 Other abnormal findings on microbiological examination of urine: Secondary | ICD-10-CM | POA: Diagnosis not present

## 2018-07-12 DIAGNOSIS — N200 Calculus of kidney: Secondary | ICD-10-CM | POA: Diagnosis not present

## 2018-07-12 DIAGNOSIS — R310 Gross hematuria: Secondary | ICD-10-CM | POA: Diagnosis not present

## 2018-07-18 NOTE — Telephone Encounter (Signed)
Dr Tamala Julian has signed the pts pt asst application.  Per the pts request I have faxed the provider part of the [employer] application to [employer] and placed the remaining 2 pages of the application and a copy of the provider part in an envelope addressed to the pt and placed in our out going mail bin as the pt states that she is mailing in her part.

## 2018-07-20 ENCOUNTER — Ambulatory Visit: Payer: PPO | Attending: Neurological Surgery | Admitting: Physical Therapy

## 2018-07-20 ENCOUNTER — Encounter: Payer: Self-pay | Admitting: Physical Therapy

## 2018-07-20 DIAGNOSIS — M5442 Lumbago with sciatica, left side: Secondary | ICD-10-CM | POA: Insufficient documentation

## 2018-07-20 DIAGNOSIS — R262 Difficulty in walking, not elsewhere classified: Secondary | ICD-10-CM | POA: Insufficient documentation

## 2018-07-20 DIAGNOSIS — M6281 Muscle weakness (generalized): Secondary | ICD-10-CM | POA: Insufficient documentation

## 2018-07-20 NOTE — Therapy (Signed)
[place], [place], [address] Phone: [phone]   Fax:  [phone]  Physical Therapy Treatment  Patient Details  Name: [name] MRN: [id number] Date of Birth: 02/04/1946 Referring Provider (PT): Elsner   Encounter Date: 07/20/2018  PT End of Session - 07/20/18 1049    Visit Number  4    Date for PT Re-Evaluation  08/01/18    PT Start Time  0925    PT Stop Time  1012    PT Time Calculation (min)  47 min    Activity Tolerance  Patient tolerated treatment well    Behavior During Therapy  [facility] for tasks assessed/performed       Past Medical History:  Diagnosis Date  . Acute hypoxemic respiratory failure ([place]) 10/24/2016  . Arthritis    both knees  . Atrial fibrillation, chronic   . Carpal tunnel syndrome, bilateral    both hands go to sleep at times from elbows down  . Chronic diastolic heart failure ([place])   . DDD (degenerative disc disease), lumbosacral   . Dyspnea   . First degree AV block   . Hematuria 11/15/2017  . History of kidney stones   . History of rib fracture 03/2011  . Lumbar stenosis   . Obesity   . Postlaminectomy syndrome   . Pre-diabetes   . Renal calculi    BILATERAL  . Right ureteral stone   . Rotator cuff tear, left   . Rotator cuff tear, right     Past Surgical History:  Procedure Laterality Date  . ANTERIOR CERVICAL DECOMP/DISCECTOMY FUSION  03-18-2011   C3 -- C7  . ANTERIOR LATERAL LUMBAR FUSION 4 LEVELS N/A 05/06/2018   Procedure: Lumbar one- two Lumbar two -three Lumbar  three-four Lumbar four-five Anterolateral decompression/fusion/posterior bilateral laminectomy Lumbar four-five Lumbar five Sacral one/posterior percutaneous fixation/mazor;  Surgeon: Kristeen Miss, MD;  Location: [place];  Service: Neurosurgery;  Laterality: N/A;  . APPENDECTOMY    . APPLICATION OF ROBOTIC ASSISTANCE FOR SPINAL PROCEDURE N/A 05/06/2018   Procedure: APPLICATION OF  ROBOTIC ASSISTANCE FOR SPINAL PROCEDURE;  Surgeon: Kristeen Miss, MD;  Location: [place];  Service: Neurosurgery;  Laterality: N/A;  . BILATERAL CARPAL TUNNEL RELEASE     3 screws in right hand, thumb, middle finger, and index finger,   . CARDIAC CATHETERIZATION N/A 01/16/2015   Procedure: Left Heart Cath and Coronary Angiography;  Surgeon: Belva Crome, MD;  Location: [place] [facility];  Service: Cardiovascular;  Laterality: N/A;  . CHOLECYSTECTOMY OPEN  1970'S  . COLONOSCOPY  2018  . CYSTOSCOPY WITH RETROGRADE PYELOGRAM, URETEROSCOPY AND STENT PLACEMENT N/A 02/13/2013   Procedure: CYSTOSCOPY WITH RETROGRADE PYELOGRAM, URETEROSCOPY AND LITHOTRIPSY ;  Surgeon: Claybon Jabs, MD;  Location: [facility];  Service: Urology;  Laterality: N/A;  . CYSTOSCOPY/URETEROSCOPY/HOLMIUM LASER/STENT PLACEMENT Right 11/22/2017   Procedure: CYSTOSCOPY/URETEROSCOPY/RETROGRADE PYELOGRAM/HOLMIUM LASER/STENT PLACEMENT;  Surgeon: Kathie Rhodes, MD;  Location: [facility];  Service: Urology;  Laterality: Right;  . EXTRACORPOREAL SHOCK WAVE LITHOTRIPSY Right 05-16-2012  . FINGER ARTHROSCOPY WITH CARPOMETACARPEL (CMC) ARTHROPLASTY    . HOLMIUM LASER APPLICATION Right 03/13/6944   Procedure: HOLMIUM LASER APPLICATION;  Surgeon: Claybon Jabs, MD;  Location: [facility];  Service: Urology;  Laterality: Right;  . KNEE ARTHROSCOPY Bilateral   . LUMBAR LAMINECTOMY  X2  1960'S  . LUMBAR PERCUTANEOUS PEDICLE SCREW 4 LEVEL N/A 05/06/2018   Procedure: LUMBAR PERCUTANEOUS PEDICLE SCREW  LUMBAR ONE TO LUMBAR FIVE WITH MAZOR ;POSTERIOR LATERAL LAMINECTOMY LUMBAR FOUR-FIVE,FIVE-SACRAL ONE;  Surgeon: Kristeen Miss, MD;  Location: [place];  Service: Neurosurgery;  Laterality: N/A;  . NASAL SEPTUM SURGERY  1970'S  . SHOULDER OPEN ROTATOR CUFF REPAIR Bilateral LEFT 01-11-2001/   RIGHT 07-21-2002  . TOTAL KNEE ARTHROPLASTY  04/13/2012   Procedure: TOTAL KNEE ARTHROPLASTY;  Surgeon: Gearlean Alf,  MD;  [facility]: [facility];  Service: Orthopedics;  Laterality: Left;  . TOTAL KNEE ARTHROPLASTY  08/08/2012   Procedure: TOTAL KNEE ARTHROPLASTY;  Surgeon: Gearlean Alf, MD;  Location: [facility];  Service: Orthopedics;  Laterality: Right;  . TRANSTHORACIC ECHOCARDIOGRAM  03-29-2011   MODERATE LVH/ EF 60-65%/ MILD MR  . VAGINAL HYSTERECTOMY  1960'S    There were no vitals filed for this visit.  Subjective Assessment - 07/20/18 0935    Subjective  Patient tried to walk into the clinic today, she had to rest in a chair 3 times to make it, reports legs are weak    Currently in Pain?  Yes    Pain Score  6     Pain Location  Back    Pain Orientation  Lower    Aggravating Factors   trying to walk                       [facility] Adult PT Treatment/Exercise - 07/20/18 0001      Ambulation/Gait   Gait Comments  gait with SPC in the clinic and then out to car, she needed a lot of cues, she did much better less wobble and trunk motions with the [facility]      Lumbar Exercises: Aerobic   Nustep  level 3 x 7 minutes      Lumbar Exercises: Machines for Strengthening   Cybex Knee Extension  5# 2x10    Cybex Knee Flexion  20# 2x10      Lumbar Exercises: Standing   Other Standing Lumbar Exercises  2# hip flexion, extension and abduction 2x10 each      Lumbar Exercises: Seated   Other Seated Lumbar Exercises  seated marches, physioball in lap for isometric abs    Other Seated Lumbar Exercises  toe raises and heel raises, ball b/n knees squeeze, isometric hip abduction               PT Short Term Goals - 07/20/18 1054      PT SHORT TERM GOAL #1   Title  I with HEP    Status  Partially Met        PT Long Term Goals - 07/20/18 1055      PT LONG TERM GOAL #1   Title  I with advanced HEP    Status  On-going      PT LONG TERM GOAL #2   Title  Patient able to bridge in bed to improve bed mobility.    Status  On-going      PT LONG TERM GOAL #3   Title  Patient to demo  improved BLE strength to 4/5 or better to normalize gait and ADLS    Status  On-going      PT LONG TERM GOAL #4   Title  Patient to report decreased pain with ADLs by 50%.    Status  On-going            Plan - 07/20/18 1049    Clinical Impression Statement  PAtient reports that she is feeling better  and taking less medicine.  She tried to walk in without a device and had to rest 3 times and was very unsteady, I found her in the hall and walked the rest of the way with her since she looked so unsafe.  She did well iwth the SPC, much less wobble and looked much better and safer.    PT Next Visit Plan  really need to get her stronger and moving    Consulted and Agree with Plan of Care  Patient       Patient will benefit from skilled therapeutic intervention in order to improve the following deficits and impairments:  Abnormal gait, Decreased range of motion, Difficulty walking, Decreased activity tolerance, Pain, Decreased balance, Decreased strength, Postural dysfunction, Impaired flexibility, Decreased safety awareness, Increased muscle spasms, Decreased endurance, Decreased mobility, Improper body mechanics  Visit Diagnosis: Acute bilateral low back pain with left-sided sciatica  Muscle weakness (generalized)  Difficulty in walking, not elsewhere classified     Problem List Patient Active Problem List   Diagnosis Date Noted  . Lumbar radiculopathy 05/12/2018  . Paraparesis ([place])   . Neurologic gait disorder   . Chronic atrial fibrillation   . Chronic diastolic congestive heart failure ([place])   . Prediabetes   . Post-operative pain   . History of fusion of cervical spine   . Acute blood loss anemia   . Leukocytosis   . Degenerative scoliosis 05/06/2018  . Scoliosis deformity of spine 05/06/2018  . Respiratory failure ([place]) 10/24/2016  . Anxiety 09/26/2016  . Paroxysmal atrial fibrillation ([place]) 12/20/2014  . Chronic anticoagulation 12/20/2014  . Essential  hypertension 07/20/2014  . Obesity 05/04/2014  . Chronic diastolic heart failure ([place]) 05/03/2014  . Ureteral calculus, right 05/16/2012  . Hydronephrosis of right kidney 05/16/2012  . Renal calculus, left 05/16/2012  . OA (osteoarthritis) of knee 04/13/2012    Sumner Boast., PT 07/20/2018, 10:55 AM  [place] [address] [place], [place], [address] Phone: [phone]   Fax:  [phone]  Name: [name] MRN: [id number] Date of Birth: 1946-03-29

## 2018-07-25 ENCOUNTER — Ambulatory Visit: Payer: PPO | Admitting: Physical Therapy

## 2018-07-25 ENCOUNTER — Encounter: Payer: Self-pay | Admitting: Physical Therapy

## 2018-07-25 DIAGNOSIS — R262 Difficulty in walking, not elsewhere classified: Secondary | ICD-10-CM

## 2018-07-25 DIAGNOSIS — M5442 Lumbago with sciatica, left side: Secondary | ICD-10-CM | POA: Diagnosis not present

## 2018-07-25 DIAGNOSIS — M6281 Muscle weakness (generalized): Secondary | ICD-10-CM

## 2018-07-25 NOTE — Therapy (Signed)
[place], [place], [address] Phone: [phone]   Fax:  [phone]  Physical Therapy Treatment  Patient Details  Name: [name] MRN: [id number] Date of Birth: Aug 19, 1945 Referring Provider (PT): Elsner   Encounter Date: 07/25/2018  PT End of Session - 07/25/18 1012    Visit Number  5    Date for PT Re-Evaluation  08/01/18    PT Start Time  0925    PT Stop Time  1010    PT Time Calculation (min)  45 min    Activity Tolerance  Patient tolerated treatment well    Behavior During Therapy  [facility] for tasks assessed/performed       Past Medical History:  Diagnosis Date  . Acute hypoxemic respiratory failure ([place]) 10/24/2016  . Arthritis    both knees  . Atrial fibrillation, chronic   . Carpal tunnel syndrome, bilateral    both hands go to sleep at times from elbows down  . Chronic diastolic heart failure ([place])   . DDD (degenerative disc disease), lumbosacral   . Dyspnea   . First degree AV block   . Hematuria 11/15/2017  . History of kidney stones   . History of rib fracture 03/2011  . Lumbar stenosis   . Obesity   . Postlaminectomy syndrome   . Pre-diabetes   . Renal calculi    BILATERAL  . Right ureteral stone   . Rotator cuff tear, left   . Rotator cuff tear, right     Past Surgical History:  Procedure Laterality Date  . ANTERIOR CERVICAL DECOMP/DISCECTOMY FUSION  03-18-2011   C3 -- C7  . ANTERIOR LATERAL LUMBAR FUSION 4 LEVELS N/A 05/06/2018   Procedure: Lumbar one- two Lumbar two -three Lumbar  three-four Lumbar four-five Anterolateral decompression/fusion/posterior bilateral laminectomy Lumbar four-five Lumbar five Sacral one/posterior percutaneous fixation/mazor;  Surgeon: Kristeen Miss, MD;  Location: [place];  Service: Neurosurgery;  Laterality: N/A;  . APPENDECTOMY    . APPLICATION OF ROBOTIC ASSISTANCE FOR SPINAL PROCEDURE N/A 05/06/2018   Procedure: APPLICATION OF  ROBOTIC ASSISTANCE FOR SPINAL PROCEDURE;  Surgeon: Kristeen Miss, MD;  Location: [place];  Service: Neurosurgery;  Laterality: N/A;  . BILATERAL CARPAL TUNNEL RELEASE     3 screws in right hand, thumb, middle finger, and index finger,   . CARDIAC CATHETERIZATION N/A 01/16/2015   Procedure: Left Heart Cath and Coronary Angiography;  Surgeon: Belva Crome, MD;  Location: [place] [facility];  Service: Cardiovascular;  Laterality: N/A;  . CHOLECYSTECTOMY OPEN  1970'S  . COLONOSCOPY  2018  . CYSTOSCOPY WITH RETROGRADE PYELOGRAM, URETEROSCOPY AND STENT PLACEMENT N/A 02/13/2013   Procedure: CYSTOSCOPY WITH RETROGRADE PYELOGRAM, URETEROSCOPY AND LITHOTRIPSY ;  Surgeon: Claybon Jabs, MD;  Location: [facility];  Service: Urology;  Laterality: N/A;  . CYSTOSCOPY/URETEROSCOPY/HOLMIUM LASER/STENT PLACEMENT Right 11/22/2017   Procedure: CYSTOSCOPY/URETEROSCOPY/RETROGRADE PYELOGRAM/HOLMIUM LASER/STENT PLACEMENT;  Surgeon: Kathie Rhodes, MD;  Location: [facility];  Service: Urology;  Laterality: Right;  . EXTRACORPOREAL SHOCK WAVE LITHOTRIPSY Right 05-16-2012  . FINGER ARTHROSCOPY WITH CARPOMETACARPEL (CMC) ARTHROPLASTY    . HOLMIUM LASER APPLICATION Right 01/13/1422   Procedure: HOLMIUM LASER APPLICATION;  Surgeon: Claybon Jabs, MD;  Location: [facility];  Service: Urology;  Laterality: Right;  . KNEE ARTHROSCOPY Bilateral   . LUMBAR LAMINECTOMY  X2  1960'S  . LUMBAR PERCUTANEOUS PEDICLE SCREW 4 LEVEL N/A 05/06/2018   Procedure: LUMBAR PERCUTANEOUS PEDICLE SCREW  LUMBAR ONE TO LUMBAR FIVE WITH MAZOR ;POSTERIOR LATERAL LAMINECTOMY LUMBAR FOUR-FIVE,FIVE-SACRAL ONE;  Surgeon: Kristeen Miss, MD;  Location: [place];  Service: Neurosurgery;  Laterality: N/A;  . NASAL SEPTUM SURGERY  1970'S  . SHOULDER OPEN ROTATOR CUFF REPAIR Bilateral LEFT 01-11-2001/   RIGHT 07-21-2002  . TOTAL KNEE ARTHROPLASTY  04/13/2012   Procedure: TOTAL KNEE ARTHROPLASTY;  Surgeon: Gearlean Alf,  MD;  [facility];  Service: Orthopedics;  Laterality: Left;  . TOTAL KNEE ARTHROPLASTY  08/08/2012   Procedure: TOTAL KNEE ARTHROPLASTY;  Surgeon: Gearlean Alf, MD;  [facility];  Service: Orthopedics;  Laterality: Right;  . TRANSTHORACIC ECHOCARDIOGRAM  03-29-2011   MODERATE LVH/ EF 60-65%/ MILD MR  . VAGINAL HYSTERECTOMY  1960'S    There were no vitals filed for this visit.  Subjective Assessment - 07/25/18 0934    Subjective  Pateint reports that she is tired and sore, but feels a little stronger than last week    Currently in Pain?  Yes    Pain Score  6     Pain Location  Back    Pain Orientation  Lower    Aggravating Factors   activity                       [facility] Adult PT Treatment/Exercise - 07/25/18 0001      Ambulation/Gait   Gait Comments  gait with SPC in the clinic and then out to car, she needed a lot of cues, she did much better less wobble and trunk motions with the [facility]      Lumbar Exercises: Aerobic   Nustep  level 4 x 7 minutes      Lumbar Exercises: Machines for Strengthening   Cybex Knee Extension  5# 2x10    Cybex Knee Flexion  20# 2x10      Lumbar Exercises: Standing   Other Standing Lumbar Exercises  2# hip flexion, extension and abduction 2x10 each      Lumbar Exercises: Seated   Other Seated Lumbar Exercises  seated marches, physioball in lap for isometric abs, ball b/n knees    Other Seated Lumbar Exercises  small seated trunk rotation and side bending working on decreasing stiffness               PT Short Term Goals - 07/20/18 1054      PT SHORT TERM GOAL #1   Title  I with HEP    Status  Partially Met        PT Long Term Goals - 07/20/18 1055      PT LONG TERM GOAL #1   Title  I with advanced HEP    Status  On-going      PT LONG TERM GOAL #2   Title  Patient able to bridge in bed to improve bed mobility.    Status  On-going      PT LONG TERM GOAL #3   Title  Patient to demo improved BLE strength  to 4/5 or better to normalize gait and ADLS    Status  On-going      PT LONG TERM GOAL #4   Title  Patient to report decreased pain with ADLs by 50%.    Status  On-going            Plan - 07/25/18 1012    Clinical Impression Statement  Patient reports stiffness in the low back, having trouble with hygiene in the bathroom due  stiffness in the lumbar spine    PT Next Visit Plan  work on trunk mobility    Consulted and Agree with Plan of Care  Patient       Patient will benefit from skilled therapeutic intervention in order to improve the following deficits and impairments:  Abnormal gait, Decreased range of motion, Difficulty walking, Decreased activity tolerance, Pain, Decreased balance, Decreased strength, Postural dysfunction, Impaired flexibility, Decreased safety awareness, Increased muscle spasms, Decreased endurance, Decreased mobility, Improper body mechanics  Visit Diagnosis: Acute bilateral low back pain with left-sided sciatica  Muscle weakness (generalized)  Difficulty in walking, not elsewhere classified     Problem List Patient Active Problem List   Diagnosis Date Noted  . Lumbar radiculopathy 05/12/2018  . Paraparesis ([place])   . Neurologic gait disorder   . Chronic atrial fibrillation   . Chronic diastolic congestive heart failure ([place])   . Prediabetes   . Post-operative pain   . History of fusion of cervical spine   . Acute blood loss anemia   . Leukocytosis   . Degenerative scoliosis 05/06/2018  . Scoliosis deformity of spine 05/06/2018  . Respiratory failure ([place]) 10/24/2016  . Anxiety 09/26/2016  . Paroxysmal atrial fibrillation ([place]) 12/20/2014  . Chronic anticoagulation 12/20/2014  . Essential hypertension 07/20/2014  . Obesity 05/04/2014  . Chronic diastolic heart failure ([place]) 05/03/2014  . Ureteral calculus, right 05/16/2012  . Hydronephrosis of right kidney 05/16/2012  . Renal calculus, left 05/16/2012  . OA (osteoarthritis) of knee  04/13/2012    Sumner Boast., PT 07/25/2018, 10:14 AM  [place], [place], [address] Phone: [phone]   Fax:  [phone]  Name: [name] MRN: [id number] Date of Birth: [birth year]

## 2018-07-27 ENCOUNTER — Ambulatory Visit: Payer: PPO | Admitting: Physical Therapy

## 2018-07-27 ENCOUNTER — Encounter: Payer: Self-pay | Admitting: Physical Therapy

## 2018-07-27 DIAGNOSIS — M5442 Lumbago with sciatica, left side: Secondary | ICD-10-CM | POA: Diagnosis not present

## 2018-07-27 DIAGNOSIS — R262 Difficulty in walking, not elsewhere classified: Secondary | ICD-10-CM

## 2018-07-27 DIAGNOSIS — M6281 Muscle weakness (generalized): Secondary | ICD-10-CM

## 2018-07-27 NOTE — Therapy (Signed)
[place], [place], [address] Phone: [phone]   Fax:  [phone]  Physical Therapy Treatment  Patient Details  Name: [name] MRN: [id number] Date of Birth: [birth year] Referring Provider (PT): Elsner   Encounter Date: 07/27/2018  PT End of Session - 07/27/18 1019    Visit Number  6    Date for PT Re-Evaluation  08/01/18    PT Start Time  1012    PT Stop Time  1109    PT Time Calculation (min)  57 min    Activity Tolerance  Patient tolerated treatment well;Patient limited by pain    Behavior During Therapy  [facility] for tasks assessed/performed       Past Medical History:  Diagnosis Date  . Acute hypoxemic respiratory failure ([place]) 10/24/2016  . Arthritis    both knees  . Atrial fibrillation, chronic   . Carpal tunnel syndrome, bilateral    both hands go to sleep at times from elbows down  . Chronic diastolic heart failure ([place])   . DDD (degenerative disc disease), lumbosacral   . Dyspnea   . First degree AV block   . Hematuria 11/15/2017  . History of kidney stones   . History of rib fracture 03/2011  . Lumbar stenosis   . Obesity   . Postlaminectomy syndrome   . Pre-diabetes   . Renal calculi    BILATERAL  . Right ureteral stone   . Rotator cuff tear, left   . Rotator cuff tear, right     Past Surgical History:  Procedure Laterality Date  . ANTERIOR CERVICAL DECOMP/DISCECTOMY FUSION  03-18-2011   C3 -- C7  . ANTERIOR LATERAL LUMBAR FUSION 4 LEVELS N/A 05/06/2018   Procedure: Lumbar one- two Lumbar two -three Lumbar  three-four Lumbar four-five Anterolateral decompression/fusion/posterior bilateral laminectomy Lumbar four-five Lumbar five Sacral one/posterior percutaneous fixation/mazor;  Surgeon: Kristeen Miss, MD;  Location: [place];  Service: Neurosurgery;  Laterality: N/A;  . APPENDECTOMY    . APPLICATION OF ROBOTIC ASSISTANCE FOR SPINAL PROCEDURE N/A 05/06/2018   Procedure: APPLICATION OF ROBOTIC ASSISTANCE FOR SPINAL PROCEDURE;  Surgeon: Kristeen Miss, MD;  Location: [place];  Service: Neurosurgery;  Laterality: N/A;  . BILATERAL CARPAL TUNNEL RELEASE     3 screws in right hand, thumb, middle finger, and index finger,   . CARDIAC CATHETERIZATION N/A 01/16/2015   Procedure: Left Heart Cath and Coronary Angiography;  Surgeon: Belva Crome, MD;  Location: [place] [facility];  Service: Cardiovascular;  Laterality: N/A;  . CHOLECYSTECTOMY OPEN  1970'S  . COLONOSCOPY  2018  . CYSTOSCOPY WITH RETROGRADE PYELOGRAM, URETEROSCOPY AND STENT PLACEMENT N/A 02/13/2013   Procedure: CYSTOSCOPY WITH RETROGRADE PYELOGRAM, URETEROSCOPY AND LITHOTRIPSY ;  Surgeon: Claybon Jabs, MD;  Location: [facility];  Service: Urology;  Laterality: N/A;  . CYSTOSCOPY/URETEROSCOPY/HOLMIUM LASER/STENT PLACEMENT Right 11/22/2017   Procedure: CYSTOSCOPY/URETEROSCOPY/RETROGRADE PYELOGRAM/HOLMIUM LASER/STENT PLACEMENT;  Surgeon: Kathie Rhodes, MD;  Location: [facility];  Service: Urology;  Laterality: Right;  . EXTRACORPOREAL SHOCK WAVE LITHOTRIPSY Right 05-16-2012  . FINGER ARTHROSCOPY WITH CARPOMETACARPEL ([facility]) ARTHROPLASTY    . HOLMIUM LASER APPLICATION Right 03/20/9891   Procedure: HOLMIUM LASER APPLICATION;  Surgeon: Claybon Jabs, MD;  Location: [facility];  Service: Urology;  Laterality: Right;  . KNEE ARTHROSCOPY Bilateral   . LUMBAR LAMINECTOMY  X2  1960'S  . LUMBAR PERCUTANEOUS PEDICLE SCREW 4 LEVEL N/A 05/06/2018   Procedure: LUMBAR PERCUTANEOUS  PEDICLE SCREW LUMBAR ONE TO LUMBAR FIVE WITH MAZOR ;POSTERIOR LATERAL LAMINECTOMY LUMBAR FOUR-FIVE,FIVE-SACRAL ONE;  Surgeon: Kristeen Miss, MD;  Location: [place];  Service: Neurosurgery;  Laterality: N/A;  . NASAL SEPTUM SURGERY  1970'S  . SHOULDER OPEN ROTATOR CUFF REPAIR Bilateral LEFT 01-11-2001/   RIGHT 07-21-2002  . TOTAL KNEE ARTHROPLASTY  04/13/2012   Procedure: TOTAL KNEE ARTHROPLASTY;   Surgeon: Gearlean Alf, MD;  Location: [facility];  Service: Orthopedics;  Laterality: Left;  . TOTAL KNEE ARTHROPLASTY  08/08/2012   Procedure: TOTAL KNEE ARTHROPLASTY;  Surgeon: Gearlean Alf, MD;  Location: [facility];  Service: Orthopedics;  Laterality: Right;  . TRANSTHORACIC ECHOCARDIOGRAM  03-29-2011   MODERATE LVH/ EF 60-65%/ MILD MR  . VAGINAL HYSTERECTOMY  1960'S    There were no vitals filed for this visit.  Subjective Assessment - 07/27/18 1017    Subjective  Patient reports that her back is hurting more today, reports that she woke up in the middle of the night hurting    Currently in Pain?  Yes    Pain Score  8     Pain Location  Back    Pain Orientation  Lower    Aggravating Factors   maybe weather or slept wrong                       [facility] Adult PT Treatment/Exercise - 07/27/18 0001      Ambulation/Gait   Gait Comments  gait with SPC in the clinic and then out to car, she needed a lot of cues, she did much better less wobble and trunk motions with the [facility]      Lumbar Exercises: Aerobic   Nustep  level 4 x 7 minutes      Lumbar Exercises: Machines for Strengthening   Cybex Knee Extension  5# 2x10    Cybex Knee Flexion  20# 2x10      Lumbar Exercises: Standing   Other Standing Lumbar Exercises  2# hip flexion, extension and abduction 2x10 each      Lumbar Exercises: Seated   Other Seated Lumbar Exercises  seated marches, physioball in lap for isometric abs, ball b/n knees    Other Seated Lumbar Exercises  small seated trunk rotation and side bending working on decreasing stiffness      Lumbar Exercises: Supine   Other Supine Lumbar Exercises  feet on ball K2C, trunk rotation and small bridges, isometric abdominals      Moist Heat Therapy   Number Minutes Moist Heat  15 Minutes    Moist Heat Location  Lumbar Spine      Electrical Stimulation   Electrical Stimulation Location  low back     Electrical Stimulation Action  IFC    Electrical  Stimulation Parameters  sitting    Electrical Stimulation Goals  Pain               PT Short Term Goals - 07/27/18 1021      PT SHORT TERM GOAL #1   Title  I with HEP    Status  Achieved        PT Long Term Goals - 07/20/18 1055      PT LONG TERM GOAL #1   Title  I with advanced HEP    Status  On-going      PT LONG TERM GOAL #2   Title  Patient able to bridge in bed to improve bed mobility.    Status  On-going      PT LONG TERM GOAL #3   Title  Patient to demo improved BLE strength to 4/5 or better to normalize gait and ADLS    Status  On-going      PT LONG TERM GOAL #4   Title  Patient to report decreased pain with ADLs by 50%.    Status  On-going            Plan - 07/27/18 1020    Clinical Impression Statement  Patient has increased LBP today, she reports that she woke up last night wtih increased pain, reports that she cannot think of anything she did to cause the pain.  She does have difficulty with the exercises due to LBP and shoulder pain    PT Next Visit Plan  work on trunk mobility    Consulted and Agree with Plan of Care  Patient       Patient will benefit from skilled therapeutic intervention in order to improve the following deficits and impairments:  Abnormal gait, Decreased range of motion, Difficulty walking, Decreased activity tolerance, Pain, Decreased balance, Decreased strength, Postural dysfunction, Impaired flexibility, Decreased safety awareness, Increased muscle spasms, Decreased endurance, Decreased mobility, Improper body mechanics  Visit Diagnosis: Acute bilateral low back pain with left-sided sciatica  Muscle weakness (generalized)  Difficulty in walking, not elsewhere classified     Problem List Patient Active Problem List   Diagnosis Date Noted  . Lumbar radiculopathy 05/12/2018  . Paraparesis ([place])   . Neurologic gait disorder   . Chronic atrial fibrillation   . Chronic diastolic congestive heart failure ([place])   .  Prediabetes   . Post-operative pain   . History of fusion of cervical spine   . Acute blood loss anemia   . Leukocytosis   . Degenerative scoliosis 05/06/2018  . Scoliosis deformity of spine 05/06/2018  . Respiratory failure ([place]) 10/24/2016  . Anxiety 09/26/2016  . Paroxysmal atrial fibrillation ([place]) 12/20/2014  . Chronic anticoagulation 12/20/2014  . Essential hypertension 07/20/2014  . Obesity 05/04/2014  . Chronic diastolic heart failure ([place]) 05/03/2014  . Ureteral calculus, right 05/16/2012  . Hydronephrosis of right kidney 05/16/2012  . Renal calculus, left 05/16/2012  . OA (osteoarthritis) of knee 04/13/2012    Sumner Boast., PT 07/27/2018, 10:21 AM  [place], [place], [address] Phone: [phone]   Fax:  [phone]  Name: [name] MRN: [id number] Date of Birth: [birth year]

## 2018-08-03 ENCOUNTER — Ambulatory Visit: Payer: PPO | Admitting: Physical Therapy

## 2018-08-03 ENCOUNTER — Encounter: Payer: Self-pay | Admitting: Physical Therapy

## 2018-08-03 DIAGNOSIS — M6281 Muscle weakness (generalized): Secondary | ICD-10-CM

## 2018-08-03 DIAGNOSIS — M5442 Lumbago with sciatica, left side: Secondary | ICD-10-CM

## 2018-08-03 DIAGNOSIS — R262 Difficulty in walking, not elsewhere classified: Secondary | ICD-10-CM

## 2018-08-03 NOTE — Addendum Note (Signed)
Addended by: Sumner Boast on: 08/03/2018 10:04 AM   Modules accepted: Orders

## 2018-08-03 NOTE — Therapy (Signed)
[place], [place], [address] Phone: [phone]   Fax:  [phone]  Physical Therapy Treatment  Patient Details  Name: [name] MRN: [id number] Date of Birth: February 15, 1946 Referring Provider (PT): Elsner   Encounter Date: 08/03/2018  PT End of Session - 08/03/18 1000    Visit Number  7    Date for PT Re-Evaluation  09/03/18    PT Start Time  0919    PT Stop Time  1000    PT Time Calculation (min)  41 min    Activity Tolerance  Patient tolerated treatment well    Behavior During Therapy  [facility] for tasks assessed/performed       Past Medical History:  Diagnosis Date  . Acute hypoxemic respiratory failure ([place]) 10/24/2016  . Arthritis    both knees  . Atrial fibrillation, chronic   . Carpal tunnel syndrome, bilateral    both hands go to sleep at times from elbows down  . Chronic diastolic heart failure ([place])   . DDD (degenerative disc disease), lumbosacral   . Dyspnea   . First degree AV block   . Hematuria 11/15/2017  . History of kidney stones   . History of rib fracture 03/2011  . Lumbar stenosis   . Obesity   . Postlaminectomy syndrome   . Pre-diabetes   . Renal calculi    BILATERAL  . Right ureteral stone   . Rotator cuff tear, left   . Rotator cuff tear, right     Past Surgical History:  Procedure Laterality Date  . ANTERIOR CERVICAL DECOMP/DISCECTOMY FUSION  03-18-2011   C3 -- C7  . ANTERIOR LATERAL LUMBAR FUSION 4 LEVELS N/A 05/06/2018   Procedure: Lumbar one- two Lumbar two -three Lumbar  three-four Lumbar four-five Anterolateral decompression/fusion/posterior bilateral laminectomy Lumbar four-five Lumbar five Sacral one/posterior percutaneous fixation/mazor;  Surgeon: Kristeen Miss, MD;  Location: [place];  Service: Neurosurgery;  Laterality: N/A;  . APPENDECTOMY    . APPLICATION OF ROBOTIC ASSISTANCE FOR SPINAL PROCEDURE N/A 05/06/2018   Procedure: APPLICATION OF  ROBOTIC ASSISTANCE FOR SPINAL PROCEDURE;  Surgeon: Kristeen Miss, MD;  Location: [place];  Service: Neurosurgery;  Laterality: N/A;  . BILATERAL CARPAL TUNNEL RELEASE     3 screws in right hand, thumb, middle finger, and index finger,   . CARDIAC CATHETERIZATION N/A 01/16/2015   Procedure: Left Heart Cath and Coronary Angiography;  Surgeon: Belva Crome, MD;  Location: [place] [facility];  Service: Cardiovascular;  Laterality: N/A;  . CHOLECYSTECTOMY OPEN  1970'S  . COLONOSCOPY  2018  . CYSTOSCOPY WITH RETROGRADE PYELOGRAM, URETEROSCOPY AND STENT PLACEMENT N/A 02/13/2013   Procedure: CYSTOSCOPY WITH RETROGRADE PYELOGRAM, URETEROSCOPY AND LITHOTRIPSY ;  Surgeon: Claybon Jabs, MD;  Location: [facility];  Service: Urology;  Laterality: N/A;  . CYSTOSCOPY/URETEROSCOPY/HOLMIUM LASER/STENT PLACEMENT Right 11/22/2017   Procedure: CYSTOSCOPY/URETEROSCOPY/RETROGRADE PYELOGRAM/HOLMIUM LASER/STENT PLACEMENT;  Surgeon: Kathie Rhodes, MD;  Location: [facility];  Service: Urology;  Laterality: Right;  . EXTRACORPOREAL SHOCK WAVE LITHOTRIPSY Right 05-16-2012  . FINGER ARTHROSCOPY WITH CARPOMETACARPEL ([facility]) ARTHROPLASTY    . HOLMIUM LASER APPLICATION Right 08/20/3660   Procedure: HOLMIUM LASER APPLICATION;  Surgeon: Claybon Jabs, MD;  Location: [facility];  Service: Urology;  Laterality: Right;  . KNEE ARTHROSCOPY Bilateral   . LUMBAR LAMINECTOMY  X2  1960'S  . LUMBAR PERCUTANEOUS PEDICLE SCREW 4 LEVEL N/A 05/06/2018   Procedure: LUMBAR PERCUTANEOUS PEDICLE SCREW  LUMBAR ONE TO LUMBAR FIVE WITH MAZOR ;POSTERIOR LATERAL LAMINECTOMY LUMBAR FOUR-FIVE,FIVE-SACRAL ONE;  Surgeon: Kristeen Miss, MD;  Location: [place];  Service: Neurosurgery;  Laterality: N/A;  . NASAL SEPTUM SURGERY  1970'S  . SHOULDER OPEN ROTATOR CUFF REPAIR Bilateral LEFT 01-11-2001/   RIGHT 07-21-2002  . TOTAL KNEE ARTHROPLASTY  04/13/2012   Procedure: TOTAL KNEE ARTHROPLASTY;  Surgeon: Gearlean Alf,  MD;  [facility];  Service: Orthopedics;  Laterality: Left;  . TOTAL KNEE ARTHROPLASTY  08/08/2012   Procedure: TOTAL KNEE ARTHROPLASTY;  Surgeon: Gearlean Alf, MD;  [facility];  Service: Orthopedics;  Laterality: Right;  . TRANSTHORACIC ECHOCARDIOGRAM  03-29-2011   MODERATE LVH/ EF 60-65%/ MILD MR  . VAGINAL HYSTERECTOMY  1960'S    There were no vitals filed for this visit.  Subjective Assessment - 08/03/18 0924    Subjective  Patient reports ache in legs at night, reports that her back is feeling a little bit better today    Currently in Pain?  Yes    Pain Score  5     Pain Location  Back    Pain Orientation  Lower    Pain Relieving Factors  maybe the exercises help                       [facility] Adult PT Treatment/Exercise - 08/03/18 0001      Lumbar Exercises: Aerobic   Nustep  level 5 x 7 minutes      Lumbar Exercises: Machines for Strengthening   Cybex Knee Extension  5# 3x10    Cybex Knee Flexion  20# 3x10      Lumbar Exercises: Standing   Other Standing Lumbar Exercises  3# hip flexion, extension and abduction 2x10 each      Lumbar Exercises: Seated   Other Seated Lumbar Exercises  seated marches, physioball in lap for isometric abs, ball b/n knees, red tband scap stabilization, ball b/n knees squeeze, yellow tband trunk rotation (small)    Other Seated Lumbar Exercises  small seated trunk rotation and side bending working on decreasing stiffness, red tband ankle DF bilateral 2x10               PT Short Term Goals - 07/27/18 1021      PT SHORT TERM GOAL #1   Title  I with HEP    Status  Achieved        PT Long Term Goals - 08/03/18 1002      PT LONG TERM GOAL #1   Title  I with advanced HEP    Status  On-going      PT LONG TERM GOAL #2   Title  Patient able to bridge in bed to improve bed mobility.    Status  Partially Met      PT LONG TERM GOAL #3   Title  Patient to demo improved BLE strength to 4/5 or better to  normalize gait and ADLS    Status  Partially Met      PT LONG TERM GOAL #4   Title  Patient to report decreased pain with ADLs by 50%.    Status  On-going            Plan - 08/03/18 1001    Clinical Impression Statement  Patient overall reporting less pain, she is walking now with the [facility] and doing better, less trendelenberg, she does report toe drag with fatigue, the right ankle with the tband  she fatigued on the second set, with the left ankle fatigue on the first set.  She does need encouragement to do HEP    PT Next Visit Plan  work on hip and ankle strength for better mobility    Consulted and Agree with Plan of Care  Patient       Patient will benefit from skilled therapeutic intervention in order to improve the following deficits and impairments:  Abnormal gait, Decreased range of motion, Difficulty walking, Decreased activity tolerance, Pain, Decreased balance, Decreased strength, Postural dysfunction, Impaired flexibility, Decreased safety awareness, Increased muscle spasms, Decreased endurance, Decreased mobility, Improper body mechanics  Visit Diagnosis: Acute bilateral low back pain with left-sided sciatica  Muscle weakness (generalized)  Difficulty in walking, not elsewhere classified     Problem List Patient Active Problem List   Diagnosis Date Noted  . Lumbar radiculopathy 05/12/2018  . Paraparesis ([place])   . Neurologic gait disorder   . Chronic atrial fibrillation   . Chronic diastolic congestive heart failure ([place])   . Prediabetes   . Post-operative pain   . History of fusion of cervical spine   . Acute blood loss anemia   . Leukocytosis   . Degenerative scoliosis 05/06/2018  . Scoliosis deformity of spine 05/06/2018  . Respiratory failure ([place]) 10/24/2016  . Anxiety 09/26/2016  . Paroxysmal atrial fibrillation ([place]) 12/20/2014  . Chronic anticoagulation 12/20/2014  . Essential hypertension 07/20/2014  . Obesity 05/04/2014  . Chronic diastolic  heart failure ([place]) 05/03/2014  . Ureteral calculus, right 05/16/2012  . Hydronephrosis of right kidney 05/16/2012  . Renal calculus, left 05/16/2012  . OA (osteoarthritis) of knee 04/13/2012    Sumner Boast., PT 08/03/2018, 10:03 AM  [place], [place], [address] Phone: [phone]   Fax:  [phone]  Name: [name] MRN: [id number] Date of Birth: [birth year]

## 2018-08-05 ENCOUNTER — Encounter: Payer: PPO | Admitting: Physical Therapy

## 2018-08-05 DIAGNOSIS — M961 Postlaminectomy syndrome, not elsewhere classified: Secondary | ICD-10-CM | POA: Diagnosis not present

## 2018-08-05 DIAGNOSIS — Z79891 Long term (current) use of opiate analgesic: Secondary | ICD-10-CM | POA: Diagnosis not present

## 2018-08-05 DIAGNOSIS — M5136 Other intervertebral disc degeneration, lumbar region: Secondary | ICD-10-CM | POA: Diagnosis not present

## 2018-08-10 DIAGNOSIS — M48061 Spinal stenosis, lumbar region without neurogenic claudication: Secondary | ICD-10-CM | POA: Diagnosis not present

## 2018-08-10 DIAGNOSIS — R030 Elevated blood-pressure reading, without diagnosis of hypertension: Secondary | ICD-10-CM | POA: Diagnosis not present

## 2018-08-10 DIAGNOSIS — Z6841 Body Mass Index (BMI) 40.0 and over, adult: Secondary | ICD-10-CM | POA: Diagnosis not present

## 2018-08-10 DIAGNOSIS — M419 Scoliosis, unspecified: Secondary | ICD-10-CM | POA: Diagnosis not present

## 2018-08-11 ENCOUNTER — Encounter: Payer: Self-pay | Admitting: Physical Therapy

## 2018-08-11 ENCOUNTER — Ambulatory Visit: Payer: PPO | Admitting: Physical Therapy

## 2018-08-11 DIAGNOSIS — R262 Difficulty in walking, not elsewhere classified: Secondary | ICD-10-CM

## 2018-08-11 DIAGNOSIS — M6281 Muscle weakness (generalized): Secondary | ICD-10-CM

## 2018-08-11 DIAGNOSIS — M5442 Lumbago with sciatica, left side: Secondary | ICD-10-CM | POA: Diagnosis not present

## 2018-08-11 NOTE — Therapy (Signed)
[place], [place], [address] Phone: [phone]   Fax:  [phone]  Physical Therapy Treatment  Patient Details  Name: [name] MRN: [id number] Date of Birth: 07-29-1945 Referring Provider (PT): Elsner   Encounter Date: 08/11/2018  PT End of Session - 08/11/18 1115    Visit Number  8    Date for PT Re-Evaluation  09/03/18    PT Start Time  0926    PT Stop Time  1009    PT Time Calculation (min)  43 min    Activity Tolerance  Patient tolerated treatment well    Behavior During Therapy  [facility] for tasks assessed/performed       Past Medical History:  Diagnosis Date  . Acute hypoxemic respiratory failure ([place]) 10/24/2016  . Arthritis    both knees  . Atrial fibrillation, chronic   . Carpal tunnel syndrome, bilateral    both hands go to sleep at times from elbows down  . Chronic diastolic heart failure ([place])   . DDD (degenerative disc disease), lumbosacral   . Dyspnea   . First degree AV block   . Hematuria 11/15/2017  . History of kidney stones   . History of rib fracture 03/2011  . Lumbar stenosis   . Obesity   . Postlaminectomy syndrome   . Pre-diabetes   . Renal calculi    BILATERAL  . Right ureteral stone   . Rotator cuff tear, left   . Rotator cuff tear, right     Past Surgical History:  Procedure Laterality Date  . ANTERIOR CERVICAL DECOMP/DISCECTOMY FUSION  03-18-2011   C3 -- C7  . ANTERIOR LATERAL LUMBAR FUSION 4 LEVELS N/A 05/06/2018   Procedure: Lumbar one- two Lumbar two -three Lumbar  three-four Lumbar four-five Anterolateral decompression/fusion/posterior bilateral laminectomy Lumbar four-five Lumbar five Sacral one/posterior percutaneous fixation/mazor;  Surgeon: Kristeen Miss, MD;  Location: [facility];  Service: Neurosurgery;  Laterality: N/A;  . APPENDECTOMY    . APPLICATION OF ROBOTIC ASSISTANCE FOR SPINAL PROCEDURE N/A 05/06/2018   Procedure: APPLICATION OF  ROBOTIC ASSISTANCE FOR SPINAL PROCEDURE;  Surgeon: Kristeen Miss, MD;  Location: [facility];  Service: Neurosurgery;  Laterality: N/A;  . BILATERAL CARPAL TUNNEL RELEASE     3 screws in right hand, thumb, middle finger, and index finger,   . CARDIAC CATHETERIZATION N/A 01/16/2015   Procedure: Left Heart Cath and Coronary Angiography;  Surgeon: Belva Crome, MD;  Location: [place] [facility];  Service: Cardiovascular;  Laterality: N/A;  . CHOLECYSTECTOMY OPEN  1970'S  . COLONOSCOPY  2018  . CYSTOSCOPY WITH RETROGRADE PYELOGRAM, URETEROSCOPY AND STENT PLACEMENT N/A 02/13/2013   Procedure: CYSTOSCOPY WITH RETROGRADE PYELOGRAM, URETEROSCOPY AND LITHOTRIPSY ;  Surgeon: Claybon Jabs, MD;  Location: [facility];  Service: Urology;  Laterality: N/A;  . CYSTOSCOPY/URETEROSCOPY/HOLMIUM LASER/STENT PLACEMENT Right 11/22/2017   Procedure: CYSTOSCOPY/URETEROSCOPY/RETROGRADE PYELOGRAM/HOLMIUM LASER/STENT PLACEMENT;  Surgeon: Kathie Rhodes, MD;  Location: [facility];  Service: Urology;  Laterality: Right;  . EXTRACORPOREAL SHOCK WAVE LITHOTRIPSY Right 05-16-2012  . FINGER ARTHROSCOPY WITH CARPOMETACARPEL (CMC) ARTHROPLASTY    . HOLMIUM LASER APPLICATION Right 03/15/4267   Procedure: HOLMIUM LASER APPLICATION;  Surgeon: Claybon Jabs, MD;  Location: [facility];  Service: Urology;  Laterality: Right;  . KNEE ARTHROSCOPY Bilateral   . LUMBAR LAMINECTOMY  X2  1960'S  . LUMBAR PERCUTANEOUS PEDICLE SCREW 4 LEVEL N/A 05/06/2018   Procedure: LUMBAR PERCUTANEOUS PEDICLE SCREW  LUMBAR ONE TO LUMBAR FIVE WITH MAZOR ;POSTERIOR LATERAL LAMINECTOMY LUMBAR FOUR-FIVE,FIVE-SACRAL ONE;  Surgeon: Kristeen Miss, MD;  Location: [facility];  Service: Neurosurgery;  Laterality: N/A;  . NASAL SEPTUM SURGERY  1970'S  . SHOULDER OPEN ROTATOR CUFF REPAIR Bilateral LEFT 01-11-2001/   RIGHT 07-21-2002  . TOTAL KNEE ARTHROPLASTY  04/13/2012   Procedure: TOTAL KNEE ARTHROPLASTY;  Surgeon: Gearlean Alf,  MD;  [facility];  Service: Orthopedics;  Laterality: Left;  . TOTAL KNEE ARTHROPLASTY  08/08/2012   Procedure: TOTAL KNEE ARTHROPLASTY;  Surgeon: Gearlean Alf, MD;  [facility];  Service: Orthopedics;  Laterality: Right;  . TRANSTHORACIC ECHOCARDIOGRAM  03-29-2011   MODERATE LVH/ EF 60-65%/ MILD MR  . VAGINAL HYSTERECTOMY  1960'S    There were no vitals filed for this visit.  Subjective Assessment - 08/11/18 0935    Subjective  Pateitn reprots that she has seen the pain management Md and the surgeon since our last visit.  They are changing her Gabapentin and working to decrease the other medications.  She reports that she saw the Surgeon, yesterday and he told her it "will take time"    Currently in Pain?  Yes    Pain Score  3     Pain Location  Back    Pain Radiating Towards  legs feel scalded    Aggravating Factors   being up a long time                       [facility] Adult PT Treatment/Exercise - 08/11/18 0001      Ambulation/Gait   Gait Comments  gait with SPC 180 feet no rest, cues to decresae the lean and wobble as well as hold upright posture      Lumbar Exercises: Aerobic   Nustep  level 5 x 7 minutes      Lumbar Exercises: Machines for Strengthening   Cybex Knee Extension  5# 3x10    Cybex Knee Flexion  20# 3x10      Lumbar Exercises: Standing   Other Standing Lumbar Exercises  3# hip flexion, extension and abduction 2x10 each      Lumbar Exercises: Seated   Other Seated Lumbar Exercises  seated marches, physioball in lap for isometric abs, ball b/n knees, red tband scap stabilization, ball b/n knees squeeze, yellow tband trunk rotation (small)    Other Seated Lumbar Exercises  small seated trunk rotation and side bending working on decreasing stiffness, red tband ankle DF bilateral 2x10, bosu behing working extension with blues band and then abs               PT Short Term Goals - 07/27/18 1021      PT SHORT TERM GOAL #1   Title   I with HEP    Status  Achieved        PT Long Term Goals - 08/11/18 1117      PT LONG TERM GOAL #2   Title  Patient able to bridge in bed to improve bed mobility.    Status  Partially Met      PT LONG TERM GOAL #3   Title  Patient to demo improved BLE strength to 4/5 or better to normalize gait and ADLS    Status  Partially Met      PT LONG TERM GOAL #4   Title  Patient to report decreased pain with ADLs by 50%.    Status  Partially Met  PT LONG TERM GOAL #5   Title  Patient able to stand and walk for 15 min or more without needing to sit due to pain.    Status  On-going            Plan - 08/11/18 1115    Clinical Impression Statement  When patient fatigues she does start to lean forward and then really increases the trendelenberg, can stop the lean with cues for psoture.  She does tend to flop the foot but has good strength with tband exercises, worse with fatigue    PT Next Visit Plan  work on core and hip strength    Consulted and Agree with Plan of Care  Patient       Patient will benefit from skilled therapeutic intervention in order to improve the following deficits and impairments:  Abnormal gait, Decreased range of motion, Difficulty walking, Decreased activity tolerance, Pain, Decreased balance, Decreased strength, Postural dysfunction, Impaired flexibility, Decreased safety awareness, Increased muscle spasms, Decreased endurance, Decreased mobility, Improper body mechanics  Visit Diagnosis: Acute bilateral low back pain with left-sided sciatica  Muscle weakness (generalized)  Difficulty in walking, not elsewhere classified     Problem List Patient Active Problem List   Diagnosis Date Noted  . Lumbar radiculopathy 05/12/2018  . 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 Neurologic gait disorder   . Chronic atrial fibrillation   . Chronic diastolic congestive heart failure ([place])   . Prediabetes   . Post-operative pain   . History of fusion of cervical spine   .  Acute blood loss anemia   . Leukocytosis   . Degenerative scoliosis 05/06/2018  . Scoliosis deformity of spine 05/06/2018  . Respiratory failure ([place]) 10/24/2016  . Anxiety 09/26/2016  . Paroxysmal atrial fibrillation ([place]) 12/20/2014  . Chronic anticoagulation 12/20/2014  . Essential hypertension 07/20/2014  . Obesity 05/04/2014  . Chronic diastolic heart failure ([place]) 05/03/2014  . Ureteral calculus, right 05/16/2012  . Hydronephrosis of right kidney 05/16/2012  . Renal calculus, left 05/16/2012  . OA (osteoarthritis) of knee 04/13/2012    Sumner Boast., PT 08/11/2018, 11:18 AM  [place], [place], [address] Phone: [phone]   Fax:  [phone]  Name: [name] MRN: [id number] Date of Birth: 06/02/1946

## 2018-08-22 NOTE — Progress Notes (Signed)
Cardiology Office Note:    Date:  08/23/2018   ID:  [name], DOB 1945-11-29, MRN [id number]  PCP:  Jani Gravel, MD  Cardiologist:  [name], MD   Referring MD: Jani Gravel, MD   Chief Complaint  Patient presents with  . Atrial Fibrillation  . Hypertension  . Advice Only    Lower extremity edema    History of Present Illness:    [name] is a 73 y.o. female with a hx of paroxysmal atrial fibrillation, first-degree AV block, diastolic heart failure, and hyperlipidemia returns for follow-up.  [name] is in today and has no real cardiac complaints.  She underwent successful prolonged cervical disc surgery by Dr. Kristeen Miss in October.  She is not in significant pain and seems to be doing relatively well.  She is on Eliquis daily.  No bleeding complications have occurred.  No transient neurological symptoms have occurred.  No noticeable palpitations or clinical atrial fibrillation in the time span since her last visit.  Complains of lower extremity swelling.  Without our knowledge she has been using furosemide 40 mg tablets intermittently for lower extremity swelling.  Hydrochlorothiazide 25 mg/day as prescribed by [place] has not been used.  She denies chest discomfort, palpitations, orthopnea, PND, syncope, and claudication.  Past Medical History:  Diagnosis Date  . Acute hypoxemic respiratory failure ([place]) 10/24/2016  . Arthritis    both knees  . Atrial fibrillation, chronic   . Carpal tunnel syndrome, bilateral    both hands go to sleep at times from elbows down  . Chronic diastolic heart failure ([place])   . DDD (degenerative disc disease), lumbosacral   . Dyspnea   . First degree AV block   . Hematuria 11/15/2017  . History of kidney stones   . History of rib fracture 03/2011  . Lumbar stenosis   . Obesity   . Postlaminectomy syndrome   . Pre-diabetes   . Renal calculi    BILATERAL  . Right ureteral stone   . Rotator cuff tear, left   . Rotator cuff  tear, right     Past Surgical History:  Procedure Laterality Date  . ANTERIOR CERVICAL DECOMP/DISCECTOMY FUSION  03-18-2011   C3 -- C7  . ANTERIOR LATERAL LUMBAR FUSION 4 LEVELS N/A 05/06/2018   Procedure: Lumbar one- two Lumbar two -three Lumbar  three-four Lumbar four-five Anterolateral decompression/fusion/posterior bilateral laminectomy Lumbar four-five Lumbar five Sacral one/posterior percutaneous fixation/mazor;  Surgeon: Kristeen Miss, MD;  Location: [facility];  Service: Neurosurgery;  Laterality: N/A;  . APPENDECTOMY    . APPLICATION OF ROBOTIC ASSISTANCE FOR SPINAL PROCEDURE N/A 05/06/2018   Procedure: APPLICATION OF ROBOTIC ASSISTANCE FOR SPINAL PROCEDURE;  Surgeon: Kristeen Miss, MD;  Location: [facility];  Service: Neurosurgery;  Laterality: N/A;  . BILATERAL CARPAL TUNNEL RELEASE     3 screws in right hand, thumb, middle finger, and index finger,   . CARDIAC CATHETERIZATION N/A 01/16/2015   Procedure: Left Heart Cath and Coronary Angiography;  Surgeon: Belva Crome, MD;  Location: [facility];  Service: Cardiovascular;  Laterality: N/A;  . CHOLECYSTECTOMY OPEN  1970'S  . COLONOSCOPY  2018  . CYSTOSCOPY WITH RETROGRADE PYELOGRAM, URETEROSCOPY AND STENT PLACEMENT N/A 02/13/2013   Procedure: CYSTOSCOPY WITH RETROGRADE PYELOGRAM, URETEROSCOPY AND LITHOTRIPSY ;  Surgeon: Claybon Jabs, MD;  Location: [facility];  Service: Urology;  Laterality: N/A;  . CYSTOSCOPY/URETEROSCOPY/HOLMIUM LASER/STENT PLACEMENT Right 11/22/2017   Procedure: CYSTOSCOPY/URETEROSCOPY/RETROGRADE PYELOGRAM/HOLMIUM LASER/STENT PLACEMENT;  Surgeon:  Kathie Rhodes, MD;  Location: [facility];  Service: Urology;  Laterality: Right;  . EXTRACORPOREAL SHOCK WAVE LITHOTRIPSY Right 05-16-2012  . FINGER ARTHROSCOPY WITH CARPOMETACARPEL (CMC) ARTHROPLASTY    . HOLMIUM LASER APPLICATION Right 09/13/1960   Procedure: HOLMIUM LASER APPLICATION;  Surgeon: Claybon Jabs, MD;  Location: [facility];  Service: Urology;  Laterality: Right;  . KNEE ARTHROSCOPY Bilateral   . LUMBAR LAMINECTOMY  X2  1960'S  . LUMBAR PERCUTANEOUS PEDICLE SCREW 4 LEVEL N/A 05/06/2018   Procedure: LUMBAR PERCUTANEOUS PEDICLE SCREW LUMBAR ONE TO LUMBAR FIVE WITH MAZOR ;POSTERIOR LATERAL LAMINECTOMY LUMBAR FOUR-FIVE,FIVE-SACRAL ONE;  Surgeon: Kristeen Miss, MD;  Location: [facility];  Service: Neurosurgery;  Laterality: N/A;  . NASAL SEPTUM SURGERY  1970'S  . SHOULDER OPEN ROTATOR CUFF REPAIR Bilateral LEFT 01-11-2001/   RIGHT 07-21-2002  . TOTAL KNEE ARTHROPLASTY  04/13/2012   Procedure: TOTAL KNEE ARTHROPLASTY;  Surgeon: Gearlean Alf, MD;  [facility];  Service: Orthopedics;  Laterality: Left;  . TOTAL KNEE ARTHROPLASTY  08/08/2012   Procedure: TOTAL KNEE ARTHROPLASTY;  Surgeon: Gearlean Alf, MD;  [facility];  Service: Orthopedics;  Laterality: Right;  . TRANSTHORACIC ECHOCARDIOGRAM  03-29-2011   MODERATE LVH/ EF 60-65%/ MILD MR  . VAGINAL HYSTERECTOMY  1960'S    Current Medications: Current Meds  Medication Sig  . acetaminophen (TYLENOL) 325 MG tablet Take 325-650 mg by mouth every 6 (six) hours as needed for mild pain.   Marland Kitchen apixaban (ELIQUIS) 5 MG TABS tablet Take 1 tablet (5 mg total) by mouth 2 (two) times daily.  . diclofenac sodium (VOLTAREN) 1 % GEL Apply 2 g topically 3 (three) times daily as needed (back pain).   Marland Kitchen diltiazem (CARTIA XT) 120 MG 24 hr capsule Take 1 capsule (120 mg total) by mouth daily.  Marland Kitchen gabapentin (NEURONTIN) 300 MG capsule Take 1 capsule (300 mg total) by mouth 3 (three) times daily.  . methocarbamol (ROBAXIN) 500 MG tablet Take 1 tablet (500 mg total) by mouth every 6 (six) hours as needed for muscle spasms.  . montelukast (SINGULAIR) 10 MG tablet Take 1 tablet (10 mg total) by mouth at bedtime.  . Oxycodone HCl 10 MG TABS One Tablet every 4- 6 hours as needed for Pain  . polyethylene glycol (MIRALAX / GLYCOLAX) packet Take 17 g by mouth 2 (two) times  daily.  Marland Kitchen senna-docusate (SENOKOT-S) 8.6-50 MG tablet Take 2 tablets by mouth at bedtime.  . [DISCONTINUED] diltiazem (CARTIA XT) 120 MG 24 hr capsule Take 1 capsule (120 mg total) by mouth daily.     Allergies:   Tape   Social History   Socioeconomic History  . Marital status: Married    Spouse name: Not on file  . Number of children: Not on file  . Years of education: Not on file  . Highest education level: Not on file  Occupational History  . Not on file  Social Needs  . Financial resource strain: Not on file  . Food insecurity:    Worry: Not on file    Inability: Not on file  . Transportation needs:    Medical: Not on file    Non-medical: Not on file  Tobacco Use  . Smoking status: Never Smoker  . Smokeless tobacco: Never Used  Substance and Sexual Activity  . Alcohol use: No  . Drug use: No  . Sexual activity: Not on file  Lifestyle  . Physical activity:    Days per  week: Not on file    Minutes per session: Not on file  . Stress: Not on file  Relationships  . Social connections:    Talks on phone: Not on file    Gets together: Not on file    Attends religious service: Not on file    Active member of club or organization: Not on file    Attends meetings of clubs or organizations: Not on file    Relationship status: Not on file  Other Topics Concern  . Not on file  Social History Narrative  . Not on file     Family History: The patient's family history includes Diabetes in her brother, brother, and sister; Diabetes type II in her mother; Heart disease in her brother and brother; Hypertension in her brother, brother, and sister; Lung cancer in her father.  ROS:   Please see the history of present illness.    Right arm is limited in motion due to relatively failed rotator cuff surgery.  All other systems reviewed and are negative.  EKGs/Labs/Other Studies Reviewed:    The following studies were reviewed today: No new/recent cardiac imaging.  EKG:  EKG  tracing from 05/11/2018 is reviewed and reveals sinus rhythm with nonspecific ST-T wave change.  Recent Labs: 05/12/2018: Magnesium 1.9 05/13/2018: ALT 25; BUN 22; Creatinine, Ser 0.72; Potassium 3.9; Sodium 138 05/20/2018: Hemoglobin 10.2; Platelets 415  Recent Lipid Panel No results found for: CHOL, TRIG, HDL, CHOLHDL, VLDL, LDLCALC, LDLDIRECT  Physical Exam:    VS:  BP 122/76   Pulse 73   Ht 5' 2.5" (1.588 m)   Wt 219 lb (99.3 kg)   SpO2 97%   BMI 39.42 kg/m     Wt Readings from Last 3 Encounters:  08/23/18 219 lb (99.3 kg)  05/20/18 219 lb 12.8 oz (99.7 kg)  05/06/18 259 lb 4.2 oz (117.6 kg)     GEN: Moderate obesity. No acute distress HEENT: Normal NECK: No JVD. LYMPHATICS: No lymphadenopathy CARDIAC: RRR.  no murmur, no gallop, less than 1+ bilateral ankle edema VASCULAR: 2+ bilateral ankle pulses, no bruits RESPIRATORY:  Clear to auscultation without rales, wheezing or rhonchi  ABDOMEN: Soft, non-tender, non-distended, No pulsatile mass, MUSCULOSKELETAL: No deformity  SKIN: Warm and dry NEUROLOGIC:  Alert and oriented x 3 PSYCHIATRIC:  Normal affect   ASSESSMENT:    1. Paroxysmal atrial fibrillation (HCC)   2. Essential hypertension   3. Chronic diastolic heart failure ([place])   4. Chronic anticoagulation   5. Mixed hyperlipidemia    PLAN:    In order of problems listed above:  1. Stable without symptomatic episodes 2. Blood pressure is controlled.  Monitor closely. 3. Ankle swelling is probably a combination of diastolic heart failure and venous insufficiency.  Recommended compression stockings and we have started Aldactone 25 mg/day.  Basic metabolic panel in 1 week. 4. Continue apixaban. 5. Lipids are elevated but no evidence of coronary disease.  Trying to control with medical regimen.  Low-fat diet encouraged.  Exercise encouraged.  Discussed primary prevention: Overall education and awareness concerning secondary risk prevention was discussed in  detail: LDL less than 70, hemoglobin A1c less than 7, blood pressure target less than 130/80 mmHg, >150 minutes of moderate aerobic activity per week, avoidance of smoking, weight control (via diet and exercise), and continued surveillance/management of/for obstructive sleep apnea.  1 year  Medication Adjustments/Labs and Tests Ordered: Current medicines are reviewed at length with the patient today.  Concerns regarding medicines are outlined above.  Orders Placed This Encounter  Procedures  . Basic metabolic panel   Meds ordered this encounter  Medications  . DISCONTD: spironolactone (ALDACTONE) 25 MG tablet    Sig: Take 1 tablet (25 mg total) by mouth daily.    Dispense:  90 tablet    Refill:  3    D/c HCTZ  . spironolactone (ALDACTONE) 25 MG tablet    Sig: Take 1 tablet (25 mg total) by mouth daily.    Dispense:  90 tablet    Refill:  3    D/c HCTZ  . diltiazem (CARTIA XT) 120 MG 24 hr capsule    Sig: Take 1 capsule (120 mg total) by mouth daily.    Dispense:  90 capsule    Refill:  3    Patient Instructions  Medication Instructions:  1) DISCONTINUE Furosemide 2) START Spironolactone 25mg  once daily  If you need a refill on your cardiac medications before your next appointment, please call your pharmacy.   Lab work: Your physician recommends that you return for lab work in: 1 week (BMET)  If you have labs (blood work) drawn today and your tests are completely normal, you will receive your results only by: Marland Kitchen MyChart Message (if you have MyChart) OR . A paper copy in the mail If you have any lab test that is abnormal or we need to change your treatment, we will call you to review the results.  Testing/Procedures: None  Follow-Up: At [facility], you and your health needs are our priority.  As part of our continuing mission to provide you with exceptional heart care, we have created designated Provider Care Teams.  These Care Teams include your primary Cardiologist  (physician) and Advanced Practice Providers (APPs -  Physician Assistants and Nurse Practitioners) who all work together to provide you with the care you need, when you need it. You will need a follow up appointment in 12 months.  Please call our office 2 months in advance to schedule this appointment.  You may see [name], MD or one of the following Advanced Practice Providers on your designated Care Team:   Truitt Merle, NP Cecilie Kicks, NP . Kathyrn Drown, NP  Any Other Special Instructions Will Be Listed Below (If Applicable).  Make sure you wear your compression stockings      Signed, [name], MD  08/23/2018 9:24 AM    [place]

## 2018-08-23 ENCOUNTER — Encounter: Payer: Self-pay | Admitting: Interventional Cardiology

## 2018-08-23 ENCOUNTER — Ambulatory Visit: Payer: PPO | Admitting: Interventional Cardiology

## 2018-08-23 VITALS — BP 122/76 | HR 73 | Ht 62.5 in | Wt 219.0 lb

## 2018-08-23 DIAGNOSIS — I10 Essential (primary) hypertension: Secondary | ICD-10-CM

## 2018-08-23 DIAGNOSIS — I480 Paroxysmal atrial fibrillation: Secondary | ICD-10-CM | POA: Diagnosis not present

## 2018-08-23 DIAGNOSIS — Z7901 Long term (current) use of anticoagulants: Secondary | ICD-10-CM | POA: Diagnosis not present

## 2018-08-23 DIAGNOSIS — I5032 Chronic diastolic (congestive) heart failure: Secondary | ICD-10-CM | POA: Diagnosis not present

## 2018-08-23 DIAGNOSIS — E782 Mixed hyperlipidemia: Secondary | ICD-10-CM | POA: Diagnosis not present

## 2018-08-23 MED ORDER — DILTIAZEM HCL ER COATED BEADS 120 MG PO CP24: 120.0000 mg | ORAL_CAPSULE | Freq: Every day | ORAL | 3 refills | Status: DC

## 2018-08-23 MED ORDER — SPIRONOLACTONE 25 MG PO TABS: 25.0000 mg | ORAL_TABLET | Freq: Every day | ORAL | 3 refills | Status: DC

## 2018-08-23 NOTE — Patient Instructions (Signed)
Medication Instructions:  1) DISCONTINUE Furosemide 2) START Spironolactone 25mg  once daily  If you need a refill on your cardiac medications before your next appointment, please call your pharmacy.   Lab work: Your physician recommends that you return for lab work in: 1 week (BMET)  If you have labs (blood work) drawn today and your tests are completely normal, you will receive your results only by: Marland Kitchen MyChart Message (if you have MyChart) OR . A paper copy in the mail If you have any lab test that is abnormal or we need to change your treatment, we will call you to review the results.  Testing/Procedures: None  Follow-Up: At [facility], you and your health needs are our priority.  As part of our continuing mission to provide you with exceptional heart care, we have created designated Provider Care Teams.  These Care Teams include your primary Cardiologist (physician) and Advanced Practice Providers (APPs -  Physician Assistants and Nurse Practitioners) who all work together to provide you with the care you need, when you need it. You will need a follow up appointment in 12 months.  Please call our office 2 months in advance to schedule this appointment.  You may see [name], MD or one of the following Advanced Practice Providers on your designated Care Team:   Truitt Merle, NP Cecilie Kicks, NP . Kathyrn Drown, NP  Any Other Special Instructions Will Be Listed Below (If Applicable).  Make sure you wear your compression stockings

## 2018-08-24 ENCOUNTER — Ambulatory Visit: Payer: PPO | Admitting: Physical Therapy

## 2018-08-31 ENCOUNTER — Encounter: Payer: PPO | Admitting: Physical Therapy

## 2018-09-01 ENCOUNTER — Other Ambulatory Visit: Payer: PPO | Admitting: *Deleted

## 2018-09-01 DIAGNOSIS — I5032 Chronic diastolic (congestive) heart failure: Secondary | ICD-10-CM | POA: Diagnosis not present

## 2018-09-01 DIAGNOSIS — I10 Essential (primary) hypertension: Secondary | ICD-10-CM

## 2018-09-01 DIAGNOSIS — I480 Paroxysmal atrial fibrillation: Secondary | ICD-10-CM | POA: Diagnosis not present

## 2018-09-01 LAB — BASIC METABOLIC PANEL
BUN / CREAT RATIO: 22 (ref 12–28)
BUN: 17 mg/dL (ref 8–27)
CHLORIDE: 101 mmol/L (ref 96–106)
CO2: 23 mmol/L (ref 20–29)
Calcium: 9.2 mg/dL (ref 8.7–10.3)
Creatinine, Ser: 0.79 mg/dL (ref 0.57–1.00)
GFR calc non Af Amer: 74 mL/min/{1.73_m2} (ref >59)
GFR, EST AFRICAN AMERICAN: 86 mL/min/{1.73_m2} (ref >59)
GLUCOSE: 138 mg/dL — AB (ref 65–99)
POTASSIUM: 4.4 mmol/L (ref 3.5–5.2)
SODIUM: 140 mmol/L (ref 134–144)

## 2018-09-02 ENCOUNTER — Other Ambulatory Visit: Payer: PPO

## 2018-09-02 ENCOUNTER — Encounter: Payer: Self-pay | Admitting: *Deleted

## 2018-09-02 DIAGNOSIS — M79605 Pain in left leg: Secondary | ICD-10-CM | POA: Diagnosis not present

## 2018-09-02 DIAGNOSIS — M545 Low back pain: Secondary | ICD-10-CM | POA: Diagnosis not present

## 2018-09-02 DIAGNOSIS — M79604 Pain in right leg: Secondary | ICD-10-CM | POA: Diagnosis not present

## 2018-09-19 NOTE — Telephone Encounter (Signed)
Received notification from [place] that Pt Asst for Eliquis has been denied due to the following reasons: Product covered by Insurance  [facility] has notified pt.

## 2018-10-03 DIAGNOSIS — E118 Type 2 diabetes mellitus with unspecified complications: Secondary | ICD-10-CM | POA: Diagnosis not present

## 2018-10-04 DIAGNOSIS — R8271 Bacteriuria: Secondary | ICD-10-CM | POA: Diagnosis not present

## 2018-10-04 DIAGNOSIS — N200 Calculus of kidney: Secondary | ICD-10-CM | POA: Diagnosis not present

## 2018-10-05 DIAGNOSIS — M48061 Spinal stenosis, lumbar region without neurogenic claudication: Secondary | ICD-10-CM | POA: Diagnosis not present

## 2018-10-17 ENCOUNTER — Other Ambulatory Visit: Payer: Self-pay

## 2018-10-17 NOTE — Patient Outreach (Signed)
[facility]) Care Management  10/17/2018  [name] [birth year] [id number]   Telephone Screen  Referral Date: 10/17/2018 Referral Source: Nurse Call Center Referral Reason: "10/15/2018-10:02am-caller states she was seen yesterday, blood test given, Hbg 9.0, wondering if U38 and folic acid ok to take for this" Insurance: [employer]   Outreach attempt # 1 to patient. Spoke with patient who reports she is doing well. She voices that she has been taking B12 and floc acid supplement daily since speaking with nurse at call center. She denies any issues or concerns regarding taking meds. She stets she feels like her weakness has gotten a little better. She is aware that she needs to follow up with MD. Patient denies any further RN CM needs or concerns at this time.Patient advised to feel free to call 24hr Nurse Line for any future needs or concerns. She voiced understanding and was appreciative of follow up call.      Plan: RN CM will close case as no further RN CM interventions needed at this time.    Enzo Montgomery, RN,BSN,CCM [facility] Telephonic Care Management Coordinator Direct Phone: [phone] Toll Free: [phone] Fax: [phone]

## 2018-10-27 ENCOUNTER — Telehealth: Payer: Self-pay

## 2018-10-27 NOTE — Telephone Encounter (Signed)
**Note De-Identified Lajoyce Tamura Obfuscation** Letter received Evren Shankland fax from [place] stating that they have approved the pt for pt asst with her Eliquis. Approval good from 10/26/2018 until 07/13/2019.

## 2018-11-23 DIAGNOSIS — R310 Gross hematuria: Secondary | ICD-10-CM | POA: Diagnosis not present

## 2018-11-29 DIAGNOSIS — Z79891 Long term (current) use of opiate analgesic: Secondary | ICD-10-CM | POA: Diagnosis not present

## 2018-11-29 DIAGNOSIS — M5136 Other intervertebral disc degeneration, lumbar region: Secondary | ICD-10-CM | POA: Diagnosis not present

## 2018-11-29 DIAGNOSIS — M47816 Spondylosis without myelopathy or radiculopathy, lumbar region: Secondary | ICD-10-CM | POA: Diagnosis not present

## 2018-11-29 DIAGNOSIS — M961 Postlaminectomy syndrome, not elsewhere classified: Secondary | ICD-10-CM | POA: Diagnosis not present

## 2018-11-30 DIAGNOSIS — R1084 Generalized abdominal pain: Secondary | ICD-10-CM | POA: Diagnosis not present

## 2018-12-08 DIAGNOSIS — R8271 Bacteriuria: Secondary | ICD-10-CM | POA: Diagnosis not present

## 2018-12-13 DIAGNOSIS — N200 Calculus of kidney: Secondary | ICD-10-CM | POA: Diagnosis not present

## 2018-12-13 DIAGNOSIS — R8279 Other abnormal findings on microbiological examination of urine: Secondary | ICD-10-CM | POA: Diagnosis not present

## 2018-12-13 DIAGNOSIS — R310 Gross hematuria: Secondary | ICD-10-CM | POA: Diagnosis not present

## 2018-12-16 ENCOUNTER — Telehealth: Payer: Self-pay | Admitting: Interventional Cardiology

## 2018-12-16 NOTE — Telephone Encounter (Signed)
New Message       [facility] Pre-operative Risk Assessment    Request for surgical clearance:  1. What type of surgery is being performed? Left Percutaneous Nephrolithotomy   When is this surgery scheduled? Clearance TBD  2. What type of clearance is required (medical clearance vs. Pharmacy clearance to hold med vs. Both)? Both  3. Are there any medications that need to be held prior to surgery and how long?  Eliquis 3 days prior   4. Practice name and name of physician performing surgery? [facility] Dr. Kathie Rhodes  5. What is your office phone number [phone] ext [phone]   7.   What is your office fax number [phone]  8.   Anesthesia type (None, local, MAC, general) ? General   Andree Coss 12/16/2018, 3:25 PM  _________________________________________________________________   (provider comments below)

## 2018-12-16 NOTE — Telephone Encounter (Signed)
Patient with diagnosis of afib on Eliquis for anticoagulation.    Procedure: Left Percutaneous Nephrolithotomy  Date of procedure: TBD  CHADS2-VASc score of  4 (CHF, HTN, AGE, DM2, stroke/tia x 2, CAD, AGE, female)  CrCl 71ml/min  Per office protocol, patient can hold Eliquis for 3 days prior to procedure.

## 2018-12-16 NOTE — Telephone Encounter (Signed)
   Primary Cardiologist: [name], MD  Chart reviewed and patient contacted by phone today as part of pre-operative protocol coverage. Given past medical history and time since last visit, based on ACC/AHA guidelines, [name] would be at acceptable risk for the planned procedure without further cardiovascular testing.   OK to hold Eliquis 3 days pre op if needed.   I will route this recommendation to the requesting party via Epic fax function and remove from pre-op pool.  Please call with questions.  Kerin Ransom, PA-C 12/16/2018, 4:05 PM

## 2018-12-22 ENCOUNTER — Other Ambulatory Visit: Payer: Self-pay | Admitting: Urology

## 2018-12-26 ENCOUNTER — Other Ambulatory Visit (HOSPITAL_COMMUNITY): Payer: Self-pay | Admitting: Urology

## 2018-12-26 DIAGNOSIS — N200 Calculus of kidney: Secondary | ICD-10-CM

## 2019-01-04 DIAGNOSIS — M419 Scoliosis, unspecified: Secondary | ICD-10-CM | POA: Diagnosis not present

## 2019-01-04 DIAGNOSIS — M48061 Spinal stenosis, lumbar region without neurogenic claudication: Secondary | ICD-10-CM | POA: Diagnosis not present

## 2019-01-19 DIAGNOSIS — R1084 Generalized abdominal pain: Secondary | ICD-10-CM | POA: Diagnosis not present

## 2019-01-19 DIAGNOSIS — R8279 Other abnormal findings on microbiological examination of urine: Secondary | ICD-10-CM | POA: Diagnosis not present

## 2019-02-06 NOTE — Progress Notes (Signed)
12-16-18 (Epic) Cardiac clearance from Dr. Tamala Julian  05-11-18 (Epic) EKG

## 2019-02-06 NOTE — Patient Instructions (Addendum)
YOU NEED TO HAVE A COVID 19 TEST ON_Thursday, July30 @3 :10, THIS TEST MUST BE DONE BEFORE SURGERY, COME TO [facility], [place] [place] , [address]. ONCE YOUR COVID TEST IS COMPLETED, PLEASE BEGIN THE QUARANTINE INSTRUCTIONS AS OUTLINED IN YOUR HANDOUT.                [name]  02/06/2019   Your procedure is scheduled on:02-13-19    Report to [facility]  Entrance     Report to radiology department @ 8:00  AM   1 VISITOR IS ALLOWED TO WAIT IN WAITING ROOM  ONLY DAY OF YOUR SURGERY.    Call this number if you have problems the morning of surgery [phone]    Remember: Do not eat food or drink liquids :After Midnight. .     Take these medicines the morning of surgery with A SIP OF WATER: Diltiazem (Cartia XT), Oxycodone HCL  BRUSH YOUR TEETH MORNING OF SURGERY AND RINSE YOUR MOUTH OUT, NO CHEWING GUM CANDY OR MINTS                                You may not have any metal on your body including hair pins and              piercings     Do not wear jewelry, cologne, lotions, powders or deodorant              Do not wear nail polish.  Do not shave  48 hours prior to surgery.                Do not bring valuables to the hospital. [place].  Contacts, dentures or bridgework may not be worn into surgery.    Special Instructions: N/A              Please read over the following fact sheets you were given: _____________________________________________________________________             [facility] - Preparing for Surgery Before surgery, you can play an important role.  Because skin is not sterile, your skin needs to be as free of germs as possible.  You can reduce the number of germs on your skin by washing with CHG (chlorahexidine gluconate) soap before surgery.  CHG is an antiseptic cleaner which kills germs and bonds with the skin to continue killing germs even after washing. Please DO NOT use if you  have an allergy to CHG or antibacterial soaps.  If your skin becomes reddened/irritated stop using the CHG and inform your nurse when you arrive at Short Stay. Do not shave (including legs and underarms) for at least 48 hours prior to the first CHG shower.  You may shave your face/neck. Please follow these instructions carefully:  1.  Shower with CHG Soap the night before surgery and the  morning of Surgery.  2.  If you choose to wash your hair, wash your hair first as usual with your  normal  shampoo.  3.  After you shampoo, rinse your hair and body thoroughly to remove the  shampoo.                           4.  Use CHG as you would any  other liquid soap.  You can apply chg directly  to the skin and wash                       Gently with a scrungie or clean washcloth.  5.  Apply the CHG Soap to your body ONLY FROM THE NECK DOWN.   Do not use on face/ open                           Wound or open sores. Avoid contact with eyes, ears mouth and genitals (private parts).                       Wash face,  Genitals (private parts) with your normal soap.             6.  Wash thoroughly, paying special attention to the area where your surgery  will be performed.  7.  Thoroughly rinse your body with warm water from the neck down.  8.  DO NOT shower/wash with your normal soap after using and rinsing off  the CHG Soap.                9.  Pat yourself dry with a clean towel.            10.  Wear clean pajamas.            11.  Place clean sheets on your bed the night of your first shower and do not  sleep with pets. Day of Surgery : Do not apply any lotions/deodorants the morning of surgery.  Please wear clean clothes to the hospital/surgery center.  FAILURE TO FOLLOW THESE INSTRUCTIONS MAY RESULT IN THE CANCELLATION OF YOUR SURGERY PATIENT SIGNATURE_________________________________  NURSE  SIGNATURE__________________________________  ________________________________________________________________________

## 2019-02-07 ENCOUNTER — Encounter (HOSPITAL_COMMUNITY)
Admission: RE | Admit: 2019-02-07 | Discharge: 2019-02-07 | Disposition: A | Discharge status: Home / Self Care | Payer: PPO | Source: Ambulatory Visit | Attending: Urology | Admitting: Urology

## 2019-02-07 ENCOUNTER — Other Ambulatory Visit: Payer: Self-pay

## 2019-02-07 ENCOUNTER — Encounter (HOSPITAL_COMMUNITY): Payer: Self-pay

## 2019-02-07 DIAGNOSIS — Z01812 Encounter for preprocedural laboratory examination: Secondary | ICD-10-CM | POA: Insufficient documentation

## 2019-02-07 DIAGNOSIS — N200 Calculus of kidney: Secondary | ICD-10-CM | POA: Insufficient documentation

## 2019-02-07 DIAGNOSIS — Z20828 Contact with and (suspected) exposure to other viral communicable diseases: Secondary | ICD-10-CM | POA: Diagnosis not present

## 2019-02-07 HISTORY — DX: Urinary tract infection, site not specified: N39.0

## 2019-02-07 LAB — BASIC METABOLIC PANEL
Anion gap: 11 (ref 5–15)
BUN: 22 mg/dL (ref 8–23)
CO2: 21 mmol/L — ABNORMAL LOW (ref 22–32)
Calcium: 9 mg/dL (ref 8.9–10.3)
Chloride: 103 mmol/L (ref 98–111)
Creatinine, Ser: 0.84 mg/dL (ref 0.44–1.00)
GFR calc Af Amer: >60 mL/min (ref >60)
GFR calc non Af Amer: >60 mL/min (ref >60)
Glucose, Bld: 108 mg/dL — ABNORMAL HIGH (ref 70–99)
Potassium: 4.9 mmol/L (ref 3.5–5.1)
Sodium: 135 mmol/L (ref 135–145)

## 2019-02-07 LAB — CBC
HCT: 39 % (ref 36.0–46.0)
Hemoglobin: 12.3 g/dL (ref 12.0–15.0)
MCH: 29.1 pg (ref 26.0–34.0)
MCHC: 31.5 g/dL (ref 30.0–36.0)
MCV: 92.2 fL (ref 80.0–100.0)
Platelets: 295 10*3/uL (ref 150–400)
RBC: 4.23 MIL/uL (ref 3.87–5.11)
RDW: 12.2 % (ref 11.5–15.5)
WBC: 6.6 10*3/uL (ref 4.0–10.5)
nRBC: 0 % (ref 0.0–0.2)

## 2019-02-07 NOTE — Progress Notes (Signed)
SPOKE W/  [name], patient     SCREENING SYMPTOMS OF COVID 19:   COUGH--NO  RUNNY NOSE--- NO  SORE THROAT---NO  NASAL CONGESTION----NO  SNEEZING----NO  SHORTNESS OF BREATH---NO  DIFFICULTY BREATHING---NO  TEMP >100.0 -----NO  UNEXPLAINED BODY ACHES------NO  CHILLS -------- NO  HEADACHES ---------NO  LOSS OF SMELL/ TASTE -----NO---    HAVE YOU OR ANY FAMILY MEMBER TRAVELLED PAST 14 DAYS OUT OF THE   COUNTY---NO STATE---NO- COUNTRY----NO  HAVE YOU OR ANY FAMILY MEMBER BEEN EXPOSED TO ANYONE WITH COVID 19?NO  Coolidge Breeze, RN 02/07/2019 1000

## 2019-02-09 ENCOUNTER — Other Ambulatory Visit (HOSPITAL_COMMUNITY)
Admission: RE | Admit: 2019-02-09 | Discharge: 2019-02-09 | Disposition: A | Discharge status: Home / Self Care | Payer: PPO | Source: Ambulatory Visit | Attending: Urology | Admitting: Urology

## 2019-02-09 ENCOUNTER — Ambulatory Visit (HOSPITAL_COMMUNITY): Payer: PPO | Admitting: Physician Assistant

## 2019-02-09 ENCOUNTER — Ambulatory Visit (HOSPITAL_COMMUNITY): Payer: PPO | Admitting: Certified Registered Nurse Anesthetist

## 2019-02-09 DIAGNOSIS — Z01812 Encounter for preprocedural laboratory examination: Secondary | ICD-10-CM | POA: Diagnosis not present

## 2019-02-09 NOTE — Progress Notes (Signed)
Anesthesia Chart Review   Case: [id number] Date/Time: 02/13/19 1045   Procedure: NEPHROLITHOTOMY PERCUTANEOUS (Left )   Anesthesia type: General   Pre-op diagnosis: LEFT STAGHORN CALCULUS   Location: [place] [facility]   Surgeon: Kathie Rhodes, MD      DISCUSSION:73 y.o. never smoker with h/o A-fib (Eliquis), pre-diabetes, chronic diastolic heart failure, left staghorn calculus scheduled for above procedure 02/13/2019 with Dr. Kathie Rhodes.   Cleared by cardiology 12/16/2018.  Per Kerin Ransom, PA-C, "Given past medical history and time since last visit, based on ACC/AHA guidelines, [name] would be at acceptable risk for the planned procedure without further cardiovascular testing. OK to hold Eliquis 3 days pre op if needed."  Anticipate pt can proceed with planned procedure barring acute status change.   VS: BP 132/73 (BP Location: Right Arm)   Pulse 72   Temp 36.9 C   Resp 14   SpO2 97%   PROVIDERS: Jani Gravel, MD  Is PCP   Daneen Schick, MD is Cardiologist  LABS: Labs reviewed: Acceptable for surgery. (all labs ordered are listed, but only abnormal results are displayed)  Labs Reviewed  BASIC METABOLIC PANEL - Abnormal; Notable for the following components:      Result Value   CO2 21 (*)    Glucose, Bld 108 (*)    All other components within normal limits  CBC     IMAGES:   EKG: 05/11/18 Rate 83 bpm Normal sinus rhythm Nonspecific ST abnormality Abnormal QRS-T angle, consider primary T wave abnormality Abnormal ECG Since last tracing there is resolution of anterior T wave abnormality  CV: Echo 10/25/16 Study Conclusions  - Left ventricle: The cavity size was normal. There was mild   concentric hypertrophy. Systolic function was normal. The   estimated ejection fraction was in the range of 55% to 60%. Wall   motion was normal; there were no regional wall motion   abnormalities. Doppler parameters are consistent with abnormal   left ventricular  relaxation (grade 1 diastolic dysfunction). - Mitral valve: Valve area by pressure half-time: 1.76 cm^2. - Left atrium: The atrium was mildly to moderately dilated.  Cardiac Cath 01/16/15  Normal coronary arteries  Normal left ventricular systolic function  False positive myocardial perfusion study   RECOMMENDATIONS:  No further ischemic evaluation is necessary  Myocardial Perfusion 01/09/15  Nuclear stress EF: 76%.  There was no ST segment deviation noted during stress.  Defect 1: There is a defect present in the basal anterior, basal anteroseptal, basal inferolateral, basal anterolateral, mid anterior, mid anteroseptal, mid inferoseptal, mid inferolateral, mid anterolateral, apical anterior, apical septal, apical lateral and apex location.  Findings consistent with ischemia.  This is a high risk study.  The left ventricular ejection fraction is hyperdynamic (>65%).   This is a high risk study. There is a large size, moderate severity reversible perfusion defect in the entire anterior, anteroseptal, anterolateral, inferolateral and mid inferoseptal walls suspicious for left main disease (SDS 12).  A cardiac cath is recommended.  Past Medical History:  Diagnosis Date  . Acute hypoxemic respiratory failure ([place]) 10/24/2016  . Arthritis    both knees  . Atrial fibrillation, chronic   . Carpal tunnel syndrome, bilateral    both hands go to sleep at times from elbows down  . Chronic diastolic heart failure ([place])   . DDD (degenerative disc disease), lumbosacral   . Dyspnea    during chronic bronchitis, resolved at this time  . First degree  AV block   . Hematuria 11/15/2017  . History of kidney stones   . History of rib fracture 03/2011  . Lumbar stenosis   . Obesity   . Postlaminectomy syndrome   . Pre-diabetes   . Renal calculi    BILATERAL  . Right ureteral stone   . Rotator cuff tear, left   . Rotator cuff tear, right   . UTI (urinary tract infection)      Past Surgical History:  Procedure Laterality Date  . ANTERIOR CERVICAL DECOMP/DISCECTOMY FUSION  03-18-2011   C3 -- C7  . ANTERIOR LATERAL LUMBAR FUSION 4 LEVELS N/A 05/06/2018   Procedure: Lumbar one- two Lumbar two -three Lumbar  three-four Lumbar four-five Anterolateral decompression/fusion/posterior bilateral laminectomy Lumbar four-five Lumbar five Sacral one/posterior percutaneous fixation/mazor;  Surgeon: Kristeen Miss, MD;  Location: [place];  Service: Neurosurgery;  Laterality: N/A;  . APPENDECTOMY    . APPLICATION OF ROBOTIC ASSISTANCE FOR SPINAL PROCEDURE N/A 05/06/2018   Procedure: APPLICATION OF ROBOTIC ASSISTANCE FOR SPINAL PROCEDURE;  Surgeon: Kristeen Miss, MD;  Location: [place];  Service: Neurosurgery;  Laterality: N/A;  . BILATERAL CARPAL TUNNEL RELEASE     3 screws in right hand, thumb, middle finger, and index finger,   . CARDIAC CATHETERIZATION N/A 01/16/2015   Procedure: Left Heart Cath and Coronary Angiography;  Surgeon: Belva Crome, MD;  Location: [facility];  Service: Cardiovascular;  Laterality: N/A;  . CHOLECYSTECTOMY OPEN  1970'S  . COLONOSCOPY  2018  . CYSTOSCOPY WITH RETROGRADE PYELOGRAM, URETEROSCOPY AND STENT PLACEMENT N/A 02/13/2013   Procedure: CYSTOSCOPY WITH RETROGRADE PYELOGRAM, URETEROSCOPY AND LITHOTRIPSY ;  Surgeon: Claybon Jabs, MD;  Location: [facility];  Service: Urology;  Laterality: N/A;  . CYSTOSCOPY/URETEROSCOPY/HOLMIUM LASER/STENT PLACEMENT Right 11/22/2017   Procedure: CYSTOSCOPY/URETEROSCOPY/RETROGRADE PYELOGRAM/HOLMIUM LASER/STENT PLACEMENT;  Surgeon: Kathie Rhodes, MD;  Location: [facility];  Service: Urology;  Laterality: Right;  . EXTRACORPOREAL SHOCK WAVE LITHOTRIPSY Right 05-16-2012  . FINGER ARTHROSCOPY WITH CARPOMETACARPEL (CMC) ARTHROPLASTY    . HOLMIUM LASER APPLICATION Right 11/11/7614   Procedure: HOLMIUM LASER APPLICATION;  Surgeon: Claybon Jabs, MD;  Location: [facility];   Service: Urology;  Laterality: Right;  . KNEE ARTHROSCOPY Bilateral   . LUMBAR LAMINECTOMY  X2  1960'S  . LUMBAR PERCUTANEOUS PEDICLE SCREW 4 LEVEL N/A 05/06/2018   Procedure: LUMBAR PERCUTANEOUS PEDICLE SCREW LUMBAR ONE TO LUMBAR FIVE WITH MAZOR ;POSTERIOR LATERAL LAMINECTOMY LUMBAR FOUR-FIVE,FIVE-SACRAL ONE;  Surgeon: Kristeen Miss, MD;  Location: [place];  Service: Neurosurgery;  Laterality: N/A;  . NASAL SEPTUM SURGERY  1970'S  . SHOULDER OPEN ROTATOR CUFF REPAIR Bilateral LEFT 01-11-2001/   RIGHT 07-21-2002  . TOTAL KNEE ARTHROPLASTY  04/13/2012   Procedure: TOTAL KNEE ARTHROPLASTY;  Surgeon: Gearlean Alf, MD;  Location[facility];  Service: Orthopedics;  Laterality: Left;  . TOTAL KNEE ARTHROPLASTY  08/08/2012   Procedure: TOTAL KNEE ARTHROPLASTY;  Surgeon: Gearlean Alf, MD;  Location: [facility];  Service: Orthopedics;  Laterality: Right;  . TRANSTHORACIC ECHOCARDIOGRAM  03-29-2011   MODERATE LVH/ EF 60-65%/ MILD MR  . VAGINAL HYSTERECTOMY  1960'S    MEDICATIONS: . acetaminophen (TYLENOL) 650 MG CR tablet  . apixaban (ELIQUIS) 5 MG TABS tablet  . diclofenac sodium (VOLTAREN) 1 % GEL  . diltiazem (CARTIA XT) 120 MG 24 hr capsule  . gabapentin (NEURONTIN) 300 MG capsule  . montelukast (SINGULAIR) 10 MG tablet  . Oxycodone HCl 10 MG TABS  . sulfamethoxazole-trimethoprim (BACTRIM DS) 800-160  MG tablet   No current facility-administered medications for this encounter.      Maia Plan [facility] Pre-Surgical Testing [phone] 02/09/19  10:48 AM

## 2019-02-09 NOTE — Anesthesia Preprocedure Evaluation (Addendum)
Anesthesia Evaluation  Patient identified by MRN, date of birth, ID band Patient awake    Reviewed: Allergy & Precautions, NPO status , Patient's Chart, lab work & pertinent test results  Airway Mallampati: II       Dental no notable dental hx. (+) Teeth Intact   Pulmonary    Pulmonary exam normal breath sounds clear to auscultation       Cardiovascular hypertension, Pt. on medications Normal cardiovascular exam Rhythm:Regular Rate:Normal     Neuro/Psych    GI/Hepatic   Endo/Other    Renal/GU   Female GU complaint     Musculoskeletal   Abdominal (+) + obese,   Peds  Hematology   Anesthesia Other Findings   Reproductive/Obstetrics                                                               Anesthesia Evaluation  Patient identified by MRN, date of birth, ID band Patient awake    Reviewed: Allergy & Precautions, NPO status , Patient's Chart, lab work & pertinent test results  Airway Mallampati: II  TM Distance: >3 FB Neck ROM: Full    Dental  (+) Teeth Intact, Dental Advisory Given   Pulmonary neg pulmonary ROS,    breath sounds clear to auscultation       Cardiovascular hypertension, + dysrhythmias Atrial Fibrillation  Rhythm:Regular Rate:Normal     Neuro/Psych Anxiety  Neuromuscular disease    GI/Hepatic negative GI ROS, Neg liver ROS,   Endo/Other  negative endocrine ROS  Renal/GU negative Renal ROS     Musculoskeletal  (+) Arthritis , Osteoarthritis,    Abdominal (+) + obese,   Peds  Hematology negative hematology ROS (+)   Anesthesia Other Findings   Reproductive/Obstetrics                            Lab Results  Component Value Date   WBC 6.6 02/07/2019   HGB 12.3 02/07/2019   HCT 39.0 02/07/2019   MCV 92.2 02/07/2019   PLT 295 02/07/2019   Lab Results  Component Value Date   CREATININE 0.84 02/07/2019   BUN 22 02/07/2019   NA 135 02/07/2019   K 4.9 02/07/2019   CL 103 02/07/2019   CO2 21 (L) 02/07/2019   Lab Results  Component Value Date   INR 1.26 04/29/2018   INR 1.2 (H) 01/10/2015   INR 1.00 07/29/2012   EKG: normal sinus rhythm, 1st degree AV block.  Anesthesia Physical Anesthesia Plan  ASA: III  Anesthesia Plan: General   Post-op Pain Management:    Induction: Intravenous  PONV Risk Score and Plan: 4 or greater and Ondansetron, Dexamethasone and Midazolam  Airway Management Planned: Oral ETT  Additional Equipment: Arterial line  Intra-op Plan:   Post-operative Plan: Extubation in OR  Informed Consent: I have reviewed the patients History and Physical, chart, labs and discussed the procedure including the risks, benefits and alternatives for the proposed anesthesia with the patient or authorized representative who has indicated his/her understanding and acceptance.   Dental advisory given  Plan Discussed with: CRNA  Anesthesia Plan Comments:        Anesthesia Quick Evaluation  Anesthesia Physical Anesthesia Plan  ASA: III  Anesthesia Plan: General  Post-op Pain Management:    Induction: Intravenous  PONV Risk Score and Plan:   Airway Management Planned: Oral ETT  Additional Equipment: None  Intra-op Plan:   Post-operative Plan: Extubation in OR  Informed Consent: I have reviewed the patients History and Physical, chart, labs and discussed the procedure including the risks, benefits and alternatives for the proposed anesthesia with the patient or authorized representative who has indicated his/her understanding and acceptance.     Dental advisory given  Plan Discussed with:   Anesthesia Plan Comments: (See PAT note 02/07/2019, Konrad Felix, PA-C)       Anesthesia Quick Evaluation

## 2019-02-10 LAB — SARS CORONAVIRUS 2 (TAT 6-24 HRS): SARS Coronavirus 2: NEGATIVE

## 2019-02-12 ENCOUNTER — Other Ambulatory Visit: Payer: Self-pay | Admitting: Radiology

## 2019-02-12 NOTE — H&P (View-Only) (Signed)
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  The problem is on the left side. This is not her first kidney stone. She has had more than 5 stones prior to getting this one. She has not caught a stone in her urine strainer since her symptoms began.   She has had eswl, ureteral stent, and ureteroscopy for treatment of her stones in the past.    10/04/18: She returns today for follow-up of her bilateral renal calculi. She is currently not having any symptoms. She said occasionally she will have some slight discomfort in her right flank region but will lie down and usually an hour or 2 it will pass. She does not see any blood in the urine and has not passed any stones. She has had extensive lumbar fusions surgery with placement of hardware. She has absolutely no voiding symptoms such as dysuria, hematuria, frequency, urgency and has no flank pain or fever. She does have a history of occasional malodor to the urine.   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 It is remains stable in size but she continues to have intermittent gross hematuria which actually has now persisted for some time. She has been forcing a lot of fluids but continues to have bleeding. She also has had pyuria and bacteriuria on multiple urinalyses that have shown contamination or mixed flora so I wanted her to come in today for cystoscopic evaluation of her bladder to rule out a colovesical fistula and also evaluate for the source of her hematuria.     CC/HPI: Six of her last 8 urine cultures showed greater than 3 organisms present even on a catheterized specimen.     ALLERGIES: Band Aid - Other Reaction, Skin blisters Sulfa Drugs - Other Reaction, UNKNOWN    MEDICATIONS: Cardizem  Eliquis  Miralax  Montelukast Sodium  Oxycodone Hcl  Oxycodone-Acetaminophen 5 mg-325 mg tablet 1 tablet PO Q 6 H PRN   Senna-Docusate Sodium  Voltaren 1 % gel     GU PSH: Catheterization For Collection Of Specimen, Single Patient, All Places Of Service - 11/30/2018 Catheterize For Residual - 11/30/2018 Cysto Uretero Lithotripsy - 2014 ESWL - 2013, 2010, 2010 Ureteroscopic laser litho, Right - 11/22/2017      PSH Notes: back surgery- screw and rods 05/06/2018   NON-GU PSH: Back Surgery (Unspecified) Cholecystectomy (open) - 2010 Hand/finger Surgery Revise Knee Joint - 2013 Rotator cuff surgery    GU PMH: Renal calculus - 07/12/2018, - 06/16/2018, - 06/03/2018 (Stable), She will be scheduled for right ureteroscopy and laser lithotripsy of her right renal calculi., - 10/19/2017, - 08/13/2017 (Stable), I discussed with her the fact that she has known renal calculi that are not causing obstruction or pain. Her multiple imaging studies in the hospital revealed no loss of renal parenchyma, no increase in size of the stones when compared with previous imaging studies, no evidence of obstruction and in addition she has normal renal function. We have been following these and will continue to do so as she would not be a good surgical candidate as we have discussed in the past., - 2018, Renal calculus, bilateral, - 2017, Staghorn calculus, - 2017 History of urolithiasis (Stable), She is now stone free. I will have her return in 6 months for a KUB. - 11/30/2017 Gross hematuria - 08/13/2017 Flank Pain (Worsening, Chronic), Right, Culture urine. No ABX  required at this time unless culture proven UTi. Ketorolac 30 mg IM today. No acute stone noted along right ureter. Stable bilateral renal stones. Will refill Oxycodone at this time and she understands if pain persists or worsens will need CT urogram. - 06/17/2017 Mixed incontinence, Urge and stress incontinence - 2014 Ureteral calculus, Proximal Ureteral Stone On The Right - 2014      PMH Notes: She has a history of medullary sponge kidney and kidney stones and has undergone  lithotripsy twice in the past for stones on the right-hand side. She has also required ureteroscopy for residual fragments after her first lithotripsy. On a CT scan done in 1/10 she had no right renal calculi and a 2.4 x 1.2 cm stone in the upper portion of her right kidney without evidence of obstruction. Hounsfield units were ~800-900. A KUB in 9/12 revealed the stone had increased in size to 3 x 3 cm.  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  Right UPJ stone treated with ESL in 11/13  24 hour urine: She was found to have no significant abnormality.  Stone analysis: calcium oxalate and calcium phosphate.    Mixed urinary incontinence: She has mild incontinence with coughing and sneezing but occasionally will also have significant urgency and what sounds like urge incontinence with associated nocturia 2-3 times     NON-GU PMH: Bacteriuria (Stable), She appears to have bacteriuria but has no symptoms other than malodorous urine. We discussed the fact that this is not an indication for antibiotic therapy and I would not recommend anyone placing her on antibiotics based on her urinalysis alone if she is asymptomatic. - 10/04/2018 Pyuria/other UA findings, She did have some pyuria and bacteriuria today. I will culture her urine. - 11/30/2017 Encounter for general adult medical examination without abnormal findings, Encounter for preventive health examination - 2017 Anxiety, Anxiety - 2014 Medullary cystic kidney, Congenital medullary sponge kidney - 2014 Atrial Fibrillation    FAMILY HISTORY: Death In The Family Father - Father Death In The Family Mother - Father Diabetes - Mother Family Health Status Number - Father Heart Attack - Brother Heart Disease - Mother Lung Cancer - Mother, Father   SOCIAL HISTORY: Marital Status: Married Preferred Language: English; Ethnicity: Not Hispanic Or Latino;  Race: White Current Smoking Status: Patient has never smoked.   Tobacco Use Assessment Completed: Used Tobacco in last 30 days? Does not use smokeless tobacco. Has never drank.  Does not use drugs. Drinks 2 caffeinated drinks per day.    REVIEW OF SYSTEMS:    GU Review Female:   Patient denies frequent urination, hard to postpone urination, burning /pain with urination, get up at night to urinate, leakage of urine, stream starts and stops, trouble starting your stream, have to strain to urinate, and being pregnant.  Gastrointestinal (Upper):   Patient denies nausea, vomiting, and indigestion/ heartburn.  Gastrointestinal (Lower):   Patient denies diarrhea and constipation.  Constitutional:   Patient denies fever, night sweats, weight loss, and fatigue.  Skin:   Patient denies skin rash/ lesion and itching.  Eyes:   Patient denies blurred vision and double vision.  Ears/ Nose/ Throat:   Patient denies sore throat and sinus problems.  Hematologic/Lymphatic:   Patient denies swollen glands and easy bruising.  Cardiovascular:   Patient denies chest pains and leg swelling.  Respiratory:   Patient denies cough and shortness of  breath.  Endocrine:   Patient denies excessive thirst.  Musculoskeletal:   Patient denies back pain and joint pain.  Neurological:   Patient denies headaches and dizziness.  Psychologic:   Patient denies depression and anxiety.   VITAL SIGNS:    Weight 201 lb / 91.17 kg  Height 62 in / 157.48 cm  BP 149/73 mmHg  Pulse 71 /min  Temperature 97.5 F / 36.3 C  BMI 36.8 kg/m   GU PHYSICAL EXAMINATION:    External Genitalia: No hirsutism, no rash, no scarring, no cyst, no erythematous lesion, no papular lesion, no blanched lesion, no warty lesion. No edema.  Urethral Meatus: Normal size. Normal position. No discharge.  Urethra: No tenderness, no mass, no scarring. No hypermobility. No leakage.  Vagina: No atrophy, no stenosis. No rectocele. No cystocele. No  enterocele.   MULTI-SYSTEM PHYSICAL EXAMINATION:    Constitutional: Well-nourished. No physical deformities. Normally developed. Good grooming.  Neck: Neck symmetrical, not swollen. Normal tracheal position.  Respiratory: No labored breathing, no use of accessory muscles.   Cardiovascular: Normal temperature, normal extremity pulses, no swelling, no varicosities.  Lymphatic: No enlargement of neck, axillae, groin.  Skin: No paleness, no jaundice, no cyanosis. No lesion, no ulcer, no rash.  Neurologic / Psychiatric: Oriented to time, oriented to place, oriented to person. No depression, no anxiety, no agitation.  Gastrointestinal: Obese abdomen. No mass, no tenderness, no rigidity.   Eyes: Normal conjunctivae. Normal eyelids.  Ears, Nose, Mouth, and Throat: Left ear no scars, no lesions, no masses. Right ear no scars, no lesions, no masses. Nose no scars, no lesions, no masses. Normal hearing. Normal lips.  Musculoskeletal: Normal gait and station of head and neck.     PAST DATA REVIEWED:  Source Of History:  Patient  Records Review:   Previous Patient Records, POC Tool  Urine Test Review:   Urine Culture         Urinalysis w/Scope Dipstick Dipstick Cont'd Micro  Color: Brown Bilirubin: Invalid mg/dL WBC/hpf: 6 - 10/hpf  Appearance: Turbid Ketones: Invalid mg/dL RBC/hpf: >60/hpf  Specific Gravity: Invalid Blood: Invalid ery/uL Bacteria: Mod (26-50/hpf)  pH: Invalid Protein: Invalid mg/dL Cystals: NS (Not Seen)  Glucose: Invalid mg/dL Urobilinogen: Invalid mg/dL Casts: NS (Not Seen)    Nitrites: Invalid Trichomonas: Not Present    Leukocyte Esterase: Invalid leu/uL Mucous: Not Present      Epithelial Cells: 0 - 5/hpf      Yeast: NS (Not Seen)      Sperm: Not Present    Notes: invalid due to color and clarity  MICROSCOPIC NOT CONCENTRATED    ASSESSMENT/PLAN:      ICD-10 Details  2 GU:   Gross hematuria - R31.0 Worsening - She has been having continuous gross hematuria and it is  coming from the left kidney.  3   Renal calculus - N20.0 Bilateral, Stable - She has bilateral renal calculi but a large left renal stone that needs to be treated due to her persistent gross hematuria.  1 NON-GU:   Pyuria/other UA findings - R82.79 Stable - At the time of cystoscopy I obtained a urine specimen which was be sent for culture and was found to be positive for 2 organisms both which were sensitive to Bactrim.  She was placed on a course of Bactrim preoperatively.         Notes:   We discussed the management of urinary stones. These options include observation, ureteroscopy, shockwave lithotripsy, and PCNL. We discussed which options  are relevant to these particular stones. We discussed the natural history of stones as well as the complications of untreated stones and the impact on quality of life without treatment as well as with each of the above listed treatments. We also discussed the efficacy of each treatment in its ability to clear the stone burden. With any of these management options I discussed the signs and symptoms of infection and the need for emergent treatment should these be experienced. For each option we discussed the ability of each procedure to clear the patient of their stone burden.   For observation I described the risks which include but are not limited to silent renal damage, life-threatening infection, need for emergent surgery, failure to pass stone, and pain.   For ureteroscopy I described the risks which include heart attack, stroke, pulmonary embolus, death, bleeding, infection, damage to contiguous structures, positioning injury, ureteral stricture, ureteral avulsion, ureteral injury, need for ureteral stent, inability to perform ureteroscopy, need for an interval procedure, inability to clear stone burden, stent discomfort and pain.   For shockwave lithotripsy I described the risks which include arrhythmia, kidney contusion, kidney hemorrhage, need for transfusion,  long-term risk of diabetes or hypertension, back discomfort, flank ecchymosis, flank abrasion, inability to break up stone, inability to pass stone fragments, Steinstrasse, infection associated with obstructing stones, need for different surgical procedure, need for repeat shockwave lithotripsy, and death.   For PCNL I described the risks including heart attack, sure, pulmonary embolus, death, positioning injury, pneumothorax, hydrothorax, need for chest tube, inability to clear stone burden, renal laceration, arterial venous fistula or malformation, need for embolization of kidney, loss of kidney or renal function, need for repeat procedure, need for prolonged nephrostomy tube, ureteral avulsion, fistula.   Her stone is very large and therefore the only treatment method that would be appropriate for this stone would be a PCNL. She is on Eliquis. She has stop this previously for procedures. She will stop that 3 days prior to the procedure.

## 2019-02-12 NOTE — H&P (Signed)
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  The problem is on the left side. This is not her first kidney stone. She has had more than 5 stones prior to getting this one. She has not caught a stone in her urine strainer since her symptoms began.   She has had eswl, ureteral stent, and ureteroscopy for treatment of her stones in the past.    10/04/18: She returns today for follow-up of her bilateral renal calculi. She is currently not having any symptoms. She said occasionally she will have some slight discomfort in her right flank region but will lie down and usually an hour or 2 it will pass. She does not see any blood in the urine and has not passed any stones. She has had extensive lumbar fusions surgery with placement of hardware. She has absolutely no voiding symptoms such as dysuria, hematuria, frequency, urgency and has no flank pain or fever. She does have a history of occasional malodor to the urine.   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 It is remains stable in size but she continues to have intermittent gross hematuria which actually has now persisted for some time. She has been forcing a lot of fluids but continues to have bleeding. She also has had pyuria and bacteriuria on multiple urinalyses that have shown contamination or mixed flora so I wanted her to come in today for cystoscopic evaluation of her bladder to rule out a colovesical fistula and also evaluate for the source of her hematuria.     CC/HPI: Six of her last 8 urine cultures showed greater than 3 organisms present even on a catheterized specimen.     ALLERGIES: Band Aid - Other Reaction, Skin blisters Sulfa Drugs - Other Reaction, UNKNOWN    MEDICATIONS: Cardizem  Eliquis  Miralax  Montelukast Sodium  Oxycodone Hcl  Oxycodone-Acetaminophen 5 mg-325 mg tablet 1 tablet PO Q 6 H PRN   Senna-Docusate Sodium  Voltaren 1 % gel     GU PSH: Catheterization For Collection Of Specimen, Single Patient, All Places Of Service - 11/30/2018 Catheterize For Residual - 11/30/2018 Cysto Uretero Lithotripsy - 2014 ESWL - 2013, 2010, 2010 Ureteroscopic laser litho, Right - 11/22/2017      PSH Notes: back surgery- screw and rods 05/06/2018   NON-GU PSH: Back Surgery (Unspecified) Cholecystectomy (open) - 2010 Hand/finger Surgery Revise Knee Joint - 2013 Rotator cuff surgery    GU PMH: Renal calculus - 07/12/2018, - 06/16/2018, - 06/03/2018 (Stable), She will be scheduled for right ureteroscopy and laser lithotripsy of her right renal calculi., - 10/19/2017, - 08/13/2017 (Stable), I discussed with her the fact that she has known renal calculi that are not causing obstruction or pain. Her multiple imaging studies in the hospital revealed no loss of renal parenchyma, no increase in size of the stones when compared with previous imaging studies, no evidence of obstruction and in addition she has normal renal function. We have been following these and will continue to do so as she would not be a good surgical candidate as we have discussed in the past., - 2018, Renal calculus, bilateral, - 2017, Staghorn calculus, - 2017 History of urolithiasis (Stable), She is now stone free. I will have her return in 6 months for a KUB. - 11/30/2017 Gross hematuria - 08/13/2017 Flank Pain (Worsening, Chronic), Right, Culture urine. No ABX  required at this time unless culture proven UTi. Ketorolac 30 mg IM today. No acute stone noted along right ureter. Stable bilateral renal stones. Will refill Oxycodone at this time and she understands if pain persists or worsens will need CT urogram. - 06/17/2017 Mixed incontinence, Urge and stress incontinence - 2014 Ureteral calculus, Proximal Ureteral Stone On The Right - 2014      PMH Notes: She has a history of medullary sponge kidney and kidney stones and has undergone  lithotripsy twice in the past for stones on the right-hand side. She has also required ureteroscopy for residual fragments after her first lithotripsy. On a CT scan done in 1/10 she had no right renal calculi and a 2.4 x 1.2 cm stone in the upper portion of her right kidney without evidence of obstruction. Hounsfield units were ~800-900. A KUB in 9/12 revealed the stone had increased in size to 3 x 3 cm.  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  Right UPJ stone treated with ESL in 11/13  24 hour urine: She was found to have no significant abnormality.  Stone analysis: calcium oxalate and calcium phosphate.    Mixed urinary incontinence: She has mild incontinence with coughing and sneezing but occasionally will also have significant urgency and what sounds like urge incontinence with associated nocturia 2-3 times     NON-GU PMH: Bacteriuria (Stable), She appears to have bacteriuria but has no symptoms other than malodorous urine. We discussed the fact that this is not an indication for antibiotic therapy and I would not recommend anyone placing her on antibiotics based on her urinalysis alone if she is asymptomatic. - 10/04/2018 Pyuria/other UA findings, She did have some pyuria and bacteriuria today. I will culture her urine. - 11/30/2017 Encounter for general adult medical examination without abnormal findings, Encounter for preventive health examination - 2017 Anxiety, Anxiety - 2014 Medullary cystic kidney, Congenital medullary sponge kidney - 2014 Atrial Fibrillation    FAMILY HISTORY: Death In The Family Father - Father Death In The Family Mother - Father Diabetes - Mother Family Health Status Number - Father Heart Attack - Brother Heart Disease - Mother Lung Cancer - Mother, Father   SOCIAL HISTORY: Marital Status: Married Preferred Language: English; Ethnicity: Not Hispanic Or Latino;  Race: White Current Smoking Status: Patient has never smoked.   Tobacco Use Assessment Completed: Used Tobacco in last 30 days? Does not use smokeless tobacco. Has never drank.  Does not use drugs. Drinks 2 caffeinated drinks per day.    REVIEW OF SYSTEMS:    GU Review Female:   Patient denies frequent urination, hard to postpone urination, burning /pain with urination, get up at night to urinate, leakage of urine, stream starts and stops, trouble starting your stream, have to strain to urinate, and being pregnant.  Gastrointestinal (Upper):   Patient denies nausea, vomiting, and indigestion/ heartburn.  Gastrointestinal (Lower):   Patient denies diarrhea and constipation.  Constitutional:   Patient denies fever, night sweats, weight loss, and fatigue.  Skin:   Patient denies skin rash/ lesion and itching.  Eyes:   Patient denies blurred vision and double vision.  Ears/ Nose/ Throat:   Patient denies sore throat and sinus problems.  Hematologic/Lymphatic:   Patient denies swollen glands and easy bruising.  Cardiovascular:   Patient denies chest pains and leg swelling.  Respiratory:   Patient denies cough and shortness of  breath.  Endocrine:   Patient denies excessive thirst.  Musculoskeletal:   Patient denies back pain and joint pain.  Neurological:   Patient denies headaches and dizziness.  Psychologic:   Patient denies depression and anxiety.   VITAL SIGNS:    Weight 201 lb / 91.17 kg  Height 62 in / 157.48 cm  BP 149/73 mmHg  Pulse 71 /min  Temperature 97.5 F / 36.3 C  BMI 36.8 kg/m   GU PHYSICAL EXAMINATION:    External Genitalia: No hirsutism, no rash, no scarring, no cyst, no erythematous lesion, no papular lesion, no blanched lesion, no warty lesion. No edema.  Urethral Meatus: Normal size. Normal position. No discharge.  Urethra: No tenderness, no mass, no scarring. No hypermobility. No leakage.  Vagina: No atrophy, no stenosis. No rectocele. No cystocele. No  enterocele.   MULTI-SYSTEM PHYSICAL EXAMINATION:    Constitutional: Well-nourished. No physical deformities. Normally developed. Good grooming.  Neck: Neck symmetrical, not swollen. Normal tracheal position.  Respiratory: No labored breathing, no use of accessory muscles.   Cardiovascular: Normal temperature, normal extremity pulses, no swelling, no varicosities.  Lymphatic: No enlargement of neck, axillae, groin.  Skin: No paleness, no jaundice, no cyanosis. No lesion, no ulcer, no rash.  Neurologic / Psychiatric: Oriented to time, oriented to place, oriented to person. No depression, no anxiety, no agitation.  Gastrointestinal: Obese abdomen. No mass, no tenderness, no rigidity.   Eyes: Normal conjunctivae. Normal eyelids.  Ears, Nose, Mouth, and Throat: Left ear no scars, no lesions, no masses. Right ear no scars, no lesions, no masses. Nose no scars, no lesions, no masses. Normal hearing. Normal lips.  Musculoskeletal: Normal gait and station of head and neck.     PAST DATA REVIEWED:  Source Of History:  Patient  Records Review:   Previous Patient Records, POC Tool  Urine Test Review:   Urine Culture         Urinalysis w/Scope Dipstick Dipstick Cont'd Micro  Color: Brown Bilirubin: Invalid mg/dL WBC/hpf: 6 - 10/hpf  Appearance: Turbid Ketones: Invalid mg/dL RBC/hpf: >60/hpf  Specific Gravity: Invalid Blood: Invalid ery/uL Bacteria: Mod (26-50/hpf)  pH: Invalid Protein: Invalid mg/dL Cystals: NS (Not Seen)  Glucose: Invalid mg/dL Urobilinogen: Invalid mg/dL Casts: NS (Not Seen)    Nitrites: Invalid Trichomonas: Not Present    Leukocyte Esterase: Invalid leu/uL Mucous: Not Present      Epithelial Cells: 0 - 5/hpf      Yeast: NS (Not Seen)      Sperm: Not Present    Notes: invalid due to color and clarity  MICROSCOPIC NOT CONCENTRATED    ASSESSMENT/PLAN:      ICD-10 Details  2 GU:   Gross hematuria - R31.0 Worsening - She has been having continuous gross hematuria and it is  coming from the left kidney.  3   Renal calculus - N20.0 Bilateral, Stable - She has bilateral renal calculi but a large left renal stone that needs to be treated due to her persistent gross hematuria.  1 NON-GU:   Pyuria/other UA findings - R82.79 Stable - At the time of cystoscopy I obtained a urine specimen which was be sent for culture and was found to be positive for 2 organisms both which were sensitive to Bactrim.  She was placed on a course of Bactrim preoperatively.         Notes:   We discussed the management of urinary stones. These options include observation, ureteroscopy, shockwave lithotripsy, and PCNL. We discussed which options  are relevant to these particular stones. We discussed the natural history of stones as well as the complications of untreated stones and the impact on quality of life without treatment as well as with each of the above listed treatments. We also discussed the efficacy of each treatment in its ability to clear the stone burden. With any of these management options I discussed the signs and symptoms of infection and the need for emergent treatment should these be experienced. For each option we discussed the ability of each procedure to clear the patient of their stone burden.   For observation I described the risks which include but are not limited to silent renal damage, life-threatening infection, need for emergent surgery, failure to pass stone, and pain.   For ureteroscopy I described the risks which include heart attack, stroke, pulmonary embolus, death, bleeding, infection, damage to contiguous structures, positioning injury, ureteral stricture, ureteral avulsion, ureteral injury, need for ureteral stent, inability to perform ureteroscopy, need for an interval procedure, inability to clear stone burden, stent discomfort and pain.   For shockwave lithotripsy I described the risks which include arrhythmia, kidney contusion, kidney hemorrhage, need for transfusion,  long-term risk of diabetes or hypertension, back discomfort, flank ecchymosis, flank abrasion, inability to break up stone, inability to pass stone fragments, Steinstrasse, infection associated with obstructing stones, need for different surgical procedure, need for repeat shockwave lithotripsy, and death.   For PCNL I described the risks including heart attack, sure, pulmonary embolus, death, positioning injury, pneumothorax, hydrothorax, need for chest tube, inability to clear stone burden, renal laceration, arterial venous fistula or malformation, need for embolization of kidney, loss of kidney or renal function, need for repeat procedure, need for prolonged nephrostomy tube, ureteral avulsion, fistula.   Her stone is very large and therefore the only treatment method that would be appropriate for this stone would be a PCNL. She is on Eliquis. She has stop this previously for procedures. She will stop that 3 days prior to the procedure.

## 2019-02-13 ENCOUNTER — Encounter (HOSPITAL_COMMUNITY): Payer: Self-pay

## 2019-02-13 ENCOUNTER — Ambulatory Visit (HOSPITAL_COMMUNITY)
Admission: RE | Admit: 2019-02-13 | Discharge: 2019-02-13 | Disposition: A | Discharge status: Home / Self Care | Payer: PPO | Source: Ambulatory Visit

## 2019-02-13 ENCOUNTER — Ambulatory Visit (HOSPITAL_COMMUNITY)
Admission: RE | Admit: 2019-02-13 | Discharge: 2019-02-14 | Disposition: A | Discharge status: Home / Self Care | Payer: PPO | Source: Ambulatory Visit | Attending: Urology | Admitting: Urology

## 2019-02-13 ENCOUNTER — Ambulatory Visit (HOSPITAL_COMMUNITY)
Admission: RE | Admit: 2019-02-13 | Discharge: 2019-02-13 | Disposition: A | Discharge status: Home / Self Care | Payer: PPO | Source: Ambulatory Visit | Attending: Urology | Admitting: Urology

## 2019-02-13 ENCOUNTER — Other Ambulatory Visit: Payer: Self-pay

## 2019-02-13 ENCOUNTER — Encounter (HOSPITAL_COMMUNITY)
Admission: RE | Disposition: A | Discharge status: Home / Self Care | Payer: Self-pay | Source: Ambulatory Visit | Attending: Urology

## 2019-02-13 DIAGNOSIS — I5032 Chronic diastolic (congestive) heart failure: Secondary | ICD-10-CM | POA: Diagnosis present

## 2019-02-13 DIAGNOSIS — N200 Calculus of kidney: Secondary | ICD-10-CM | POA: Insufficient documentation

## 2019-02-13 DIAGNOSIS — N132 Hydronephrosis with renal and ureteral calculous obstruction: Secondary | ICD-10-CM | POA: Insufficient documentation

## 2019-02-13 DIAGNOSIS — Z882 Allergy status to sulfonamides status: Secondary | ICD-10-CM | POA: Insufficient documentation

## 2019-02-13 DIAGNOSIS — N1330 Unspecified hydronephrosis: Secondary | ICD-10-CM | POA: Diagnosis present

## 2019-02-13 DIAGNOSIS — I480 Paroxysmal atrial fibrillation: Secondary | ICD-10-CM | POA: Diagnosis not present

## 2019-02-13 DIAGNOSIS — Z79899 Other long term (current) drug therapy: Secondary | ICD-10-CM | POA: Insufficient documentation

## 2019-02-13 DIAGNOSIS — T7840XA Allergy, unspecified, initial encounter: Secondary | ICD-10-CM | POA: Diagnosis not present

## 2019-02-13 DIAGNOSIS — Z7901 Long term (current) use of anticoagulants: Secondary | ICD-10-CM | POA: Insufficient documentation

## 2019-02-13 DIAGNOSIS — Z436 Encounter for attention to other artificial openings of urinary tract: Secondary | ICD-10-CM | POA: Insufficient documentation

## 2019-02-13 HISTORY — PX: IR URETERAL STENT LEFT NEW ACCESS W/O SEP NEPHROSTOMY CATH: IMG6075

## 2019-02-13 LAB — PROTIME-INR
INR: 0.9 (ref 0.8–1.2)
Prothrombin Time: 12.5 seconds (ref 11.4–15.2)

## 2019-02-13 LAB — CBC WITH DIFFERENTIAL/PLATELET
Abs Immature Granulocytes: 0.07 10*3/uL (ref 0.00–0.07)
Basophils Absolute: 0.1 10*3/uL (ref 0.0–0.1)
Basophils Relative: 1 %
Eosinophils Absolute: 0.2 10*3/uL (ref 0.0–0.5)
Eosinophils Relative: 3 %
HCT: 36 % (ref 36.0–46.0)
Hemoglobin: 11.2 g/dL — ABNORMAL LOW (ref 12.0–15.0)
Immature Granulocytes: 1 %
Lymphocytes Relative: 27 %
Lymphs Abs: 1.5 10*3/uL (ref 0.7–4.0)
MCH: 28.5 pg (ref 26.0–34.0)
MCHC: 31.1 g/dL (ref 30.0–36.0)
MCV: 91.6 fL (ref 80.0–100.0)
Monocytes Absolute: 0.4 10*3/uL (ref 0.1–1.0)
Monocytes Relative: 7 %
Neutro Abs: 3.5 10*3/uL (ref 1.7–7.7)
Neutrophils Relative %: 61 %
Platelets: 294 10*3/uL (ref 150–400)
RBC: 3.93 MIL/uL (ref 3.87–5.11)
RDW: 11.9 % (ref 11.5–15.5)
WBC: 5.8 10*3/uL (ref 4.0–10.5)
nRBC: 0 % (ref 0.0–0.2)

## 2019-02-13 LAB — BASIC METABOLIC PANEL
Anion gap: 11 (ref 5–15)
BUN: 19 mg/dL (ref 8–23)
CO2: 26 mmol/L (ref 22–32)
Calcium: 8.9 mg/dL (ref 8.9–10.3)
Chloride: 105 mmol/L (ref 98–111)
Creatinine, Ser: 0.71 mg/dL (ref 0.44–1.00)
GFR calc Af Amer: >60 mL/min (ref >60)
GFR calc non Af Amer: >60 mL/min (ref >60)
Glucose, Bld: 114 mg/dL — ABNORMAL HIGH (ref 70–99)
Potassium: 3.9 mmol/L (ref 3.5–5.1)
Sodium: 142 mmol/L (ref 135–145)

## 2019-02-13 SURGERY — NEPHROLITHOTOMY PERCUTANEOUS
Anesthesia: General | Laterality: Left

## 2019-02-13 MED ORDER — SODIUM CHLORIDE 0.9 % IV SOLN
INTRAVENOUS | Status: DC
  Administered 2019-02-13: 15:00:00 via INTRAVENOUS

## 2019-02-13 MED ORDER — MIDAZOLAM HCL 2 MG/2ML IJ SOLN
INTRAMUSCULAR | Status: AC
  Filled 2019-02-13: qty 4

## 2019-02-13 MED ORDER — ONDANSETRON HCL 4 MG/2ML IJ SOLN
INTRAMUSCULAR | Status: AC | PRN
  Administered 2019-02-13: 4 mg via INTRAVENOUS

## 2019-02-13 MED ORDER — DIPHENHYDRAMINE HCL 50 MG/ML IJ SOLN
INTRAMUSCULAR | Status: AC
  Filled 2019-02-13: qty 1

## 2019-02-13 MED ORDER — LIDOCAINE HCL (PF) 1 % IJ SOLN
INTRAMUSCULAR | Status: AC | PRN
  Administered 2019-02-13: 10 mL

## 2019-02-13 MED ORDER — ONDANSETRON HCL 4 MG/2ML IJ SOLN
4.0000 mg | Freq: Four times a day (QID) | INTRAMUSCULAR | Status: DC | PRN
  Administered 2019-02-13: 14:00:00 4 mg via INTRAVENOUS
  Filled 2019-02-13: qty 2

## 2019-02-13 MED ORDER — OXYCODONE HCL 5 MG PO TABS
5.0000 mg | ORAL_TABLET | Freq: Four times a day (QID) | ORAL | Status: DC | PRN
  Administered 2019-02-13 – 2019-02-14 (×3): 5 mg via ORAL
  Filled 2019-02-13 (×3): qty 1

## 2019-02-13 MED ORDER — IOHEXOL 300 MG/ML  SOLN
50.0000 mL | Freq: Once | INTRAMUSCULAR | Status: AC | PRN
  Administered 2019-02-13: 15 mL

## 2019-02-13 MED ORDER — FENTANYL CITRATE (PF) 100 MCG/2ML IJ SOLN
INTRAMUSCULAR | Status: AC
  Filled 2019-02-13: qty 2

## 2019-02-13 MED ORDER — ONDANSETRON HCL 4 MG/2ML IJ SOLN
INTRAMUSCULAR | Status: AC
  Filled 2019-02-13: qty 2

## 2019-02-13 MED ORDER — MIDAZOLAM HCL 2 MG/2ML IJ SOLN
INTRAMUSCULAR | Status: AC | PRN
  Administered 2019-02-13 (×5): 1 mg via INTRAVENOUS

## 2019-02-13 MED ORDER — LACTATED RINGERS IV SOLN
INTRAVENOUS | Status: DC
  Administered 2019-02-13: 09:00:00 via INTRAVENOUS

## 2019-02-13 MED ORDER — METHYLPREDNISOLONE SODIUM SUCC 125 MG IJ SOLR
60.0000 mg | Freq: Three times a day (TID) | INTRAMUSCULAR | Status: DC
  Administered 2019-02-13 (×2): 60 mg via INTRAVENOUS
  Filled 2019-02-13 (×2): qty 2

## 2019-02-13 MED ORDER — DIPHENHYDRAMINE HCL 50 MG/ML IJ SOLN
12.5000 mg | Freq: Once | INTRAMUSCULAR | Status: AC
  Administered 2019-02-13: 12:00:00 12.5 mg via INTRAVENOUS

## 2019-02-13 MED ORDER — MIDAZOLAM HCL 2 MG/2ML IJ SOLN
INTRAMUSCULAR | Status: AC
  Filled 2019-02-13: qty 2

## 2019-02-13 MED ORDER — HYDRALAZINE HCL 20 MG/ML IJ SOLN: 10.0000 mg | Freq: Four times a day (QID) | INTRAMUSCULAR | Status: DC | PRN

## 2019-02-13 MED ORDER — DIPHENHYDRAMINE HCL 50 MG/ML IJ SOLN
INTRAMUSCULAR | Status: AC | PRN
  Administered 2019-02-13: 25 mg via INTRAVENOUS

## 2019-02-13 MED ORDER — FAMOTIDINE IN NACL 20-0.9 MG/50ML-% IV SOLN
20.0000 mg | Freq: Two times a day (BID) | INTRAVENOUS | Status: DC
  Administered 2019-02-13 (×2): 20 mg via INTRAVENOUS
  Filled 2019-02-13 (×2): qty 50

## 2019-02-13 MED ORDER — MIDAZOLAM HCL 2 MG/2ML IJ SOLN
1.0000 mg | Freq: Once | INTRAMUSCULAR | Status: AC
  Administered 2019-02-13: 13:00:00 1 mg via INTRAVENOUS

## 2019-02-13 MED ORDER — DIPHENHYDRAMINE HCL 25 MG PO CAPS
25.0000 mg | ORAL_CAPSULE | Freq: Four times a day (QID) | ORAL | Status: DC | PRN
  Administered 2019-02-13: 25 mg via ORAL
  Filled 2019-02-13: qty 1

## 2019-02-13 MED ORDER — MONTELUKAST SODIUM 10 MG PO TABS
10.0000 mg | ORAL_TABLET | Freq: Every day | ORAL | Status: DC
  Administered 2019-02-13: 10 mg via ORAL
  Filled 2019-02-13: qty 1

## 2019-02-13 MED ORDER — CEFAZOLIN SODIUM-DEXTROSE 2-4 GM/100ML-% IV SOLN: 2.0000 g | INTRAVENOUS | Status: DC

## 2019-02-13 MED ORDER — PROPOFOL 10 MG/ML IV BOLUS
INTRAVENOUS | Status: AC
  Filled 2019-02-13: qty 20

## 2019-02-13 MED ORDER — DICLOFENAC SODIUM 1 % TD GEL
2.0000 g | Freq: Three times a day (TID) | TRANSDERMAL | Status: DC | PRN
  Filled 2019-02-13: qty 100

## 2019-02-13 MED ORDER — DEXAMETHASONE SODIUM PHOSPHATE 10 MG/ML IJ SOLN
INTRAMUSCULAR | Status: AC
  Administered 2019-02-13: 12:00:00 10 mg
  Filled 2019-02-13: qty 1

## 2019-02-13 MED ORDER — DEXAMETHASONE SODIUM PHOSPHATE 10 MG/ML IJ SOLN
10.0000 mg | Freq: Once | INTRAMUSCULAR | Status: AC
  Administered 2019-02-13: 10 mg via INTRAVENOUS
  Filled 2019-02-13: qty 1

## 2019-02-13 MED ORDER — LIDOCAINE HCL 1 % IJ SOLN
INTRAMUSCULAR | Status: AC
  Filled 2019-02-13: qty 20

## 2019-02-13 MED ORDER — FENTANYL CITRATE (PF) 100 MCG/2ML IJ SOLN
INTRAMUSCULAR | Status: AC | PRN
  Administered 2019-02-13 (×3): 50 ug via INTRAVENOUS

## 2019-02-13 MED ORDER — FENTANYL CITRATE (PF) 250 MCG/5ML IJ SOLN
INTRAMUSCULAR | Status: AC
  Filled 2019-02-13: qty 5

## 2019-02-13 MED ORDER — DEXAMETHASONE SODIUM PHOSPHATE 10 MG/ML IJ SOLN
INTRAMUSCULAR | Status: AC
  Filled 2019-02-13: qty 1

## 2019-02-13 MED ORDER — DIPHENHYDRAMINE HCL 50 MG/ML IJ SOLN: 25.0000 mg | Freq: Once | INTRAMUSCULAR | Status: AC

## 2019-02-13 MED ORDER — DILTIAZEM HCL ER COATED BEADS 120 MG PO CP24
120.0000 mg | ORAL_CAPSULE | Freq: Every day | ORAL | Status: DC
  Administered 2019-02-14: 120 mg via ORAL
  Filled 2019-02-13: qty 1

## 2019-02-13 MED ORDER — CEFAZOLIN SODIUM-DEXTROSE 2-4 GM/100ML-% IV SOLN
INTRAVENOUS | Status: AC
  Administered 2019-02-13: 2000 mg
  Filled 2019-02-13: qty 100

## 2019-02-13 MED ORDER — DIPHENHYDRAMINE HCL 25 MG PO CAPS: 50.0000 mg | ORAL_CAPSULE | Freq: Three times a day (TID) | ORAL | Status: DC

## 2019-02-13 MED ORDER — NALBUPHINE HCL 20 MG/ML IJ SOLN
5.0000 mg | Freq: Once | INTRAMUSCULAR | Status: DC
  Filled 2019-02-13: qty 0.25

## 2019-02-13 NOTE — Discharge Instructions (Signed)
Percutaneous Nephrostomy, Care After °This sheet gives you information about how to care for yourself after your procedure. Your health care provider may also give you more specific instructions. If you have problems or questions, contact your health care provider. °What can I expect after the procedure? °After the procedure, it is common to have: °· Some soreness where the nephrostomy tube was inserted (tube insertion site). °· Blood-tinged drainage from the nephrostomy tube for the first 24 hours. °Follow these instructions at home: °Activity °· Return to your normal activities as told by your health care provider. Ask your health care provider what activities are safe for you. °· Avoid activities that may cause the nephrostomy tubing to bend. °· Do not take baths, swim, or use a hot tub until your health care provider approves. Ask your health care provider if you can take showers. Cover the nephrostomy tube dressing with a watertight covering when you take a shower. °· Do not drive for 24 hours if you were given a medicine to help you relax (sedative). °Care of the tube insertion site ° °· Follow instructions from your health care provider about how to take care of your tube insertion site. Make sure you: °? Wash your hands with soap and water before you change your bandage (dressing). If soap and water are not available, use hand sanitizer. °? Change your dressing as told by your health care provider. Be careful not to pull on the tube while removing the dressing. °? When you change the dressing, wash the skin around the tube, rinse well, and pat the skin dry. °· Check the tube insertion area every day for signs of infection. Check for: °? More redness, swelling, or pain. °? More fluid or blood. °? Warmth. °? Pus or a bad smell. °Care of the nephrostomy tube and drainage bag °· Always keep the tubing, the leg bag, or the bedside drainage bags below the level of the kidney so that your urine drains  freely. °· When connecting your nephrostomy tube to a drainage bag, make sure that there are no kinks in the tubing and that your urine is draining freely. You may want to use an elastic bandage to wrap any exposed tubing that goes from the nephrostomy tube to any of the connecting tubes. °· At night, you may want to connect your nephrostomy tube or the leg bag to a larger bedside drainage bag. °· Follow instructions from your health care provider about how to empty or change the drainage bag. °· Empty the drainage bag when it becomes ? full. °· Replace the drainage bag and any extension tubing that is connected to your nephrostomy tube every 3 weeks or as often as told by your health care provider. Your health care provider will explain how to change the drainage bag and extension tubing. °General instructions °· Take over-the-counter and prescription medicines only as told by your health care provider. °· Keep all follow-up visits as told by your health care provider. This is important. °Contact a health care provider if: °· You have problems with any of the valves or tubing. °· You have persistent pain or soreness in your back. °· You have more redness, swelling, or pain around your tube insertion site. °· You have more fluid or blood coming from your tube insertion site. °· Your tube insertion site feels warm to the touch. °· You have pus or a bad smell coming from your tube insertion site. °· You have increased urine output or you feel   burning when urinating. °Get help right away if: °· You have pain in your abdomen during the first week. °· You have chest pain or have trouble breathing. °· You have a new appearance of blood in your urine. °· You have a fever or chills. °· You have back pain that is not relieved by your medicine. °· You have decreased urine output. °· Your nephrostomy tube comes out. °This information is not intended to replace advice given to you by your health care provider. Make sure you  discuss any questions you have with your health care provider. °Document Released: 02/20/2004 Document Revised: 06/11/2017 Document Reviewed: 04/10/2016 °Elsevier Patient Education © 2020 [employer] ° °

## 2019-02-13 NOTE — Progress Notes (Signed)
The patient came into the hospital this morning for a nephrostomy tube to be placed in her left kidney due to a partial staghorn calculus and she received Ancef intravenously and had her tube placed.  She then went to the preop holding area in preparation for her surgery and I saw her at that time and she seemed to be doing fine.  Sometime later the anesthesiologist came and got me and indicated that she appeared to be having some form of allergic reaction.  She had developed a red rash over most of her body.  She was not having any difficulty breathing.  Her vital signs have remained stable with no hypotension or tachycardia.  She had received Ancef on numerous occasions in the past.  It was felt she was having an allergic reaction to the contrast that was used for her nephrostomy tube placement and the anesthesiologist felt it would not be wise to proceed with her elective surgery today.  He did give her a dose of Decadron.  She does not appear to be septic.  She did have a culture proven UTI preoperatively that was sensitive to Bactrim and she has been taking that up until the day of her surgery.  My suspicion for bacteremia is low.  It appears most likely cause for her reaction is the iodinated contrast material.  At this point her surgery has been canceled.  She will be admitted to the hospital for overnight observation, hydration and steroids.  I will ask the hospitalist to aid with management of her allergic reaction during her hospitalization and then will need to reschedule her surgery.  I discussed this with both her and her husband.  They understand the need to postpone surgery and the need for her to stay in the hospital overnight for observation with likely discharge in the morning.

## 2019-02-13 NOTE — Consult Note (Signed)
Chief Complaint: Patient was seen in consultation today for left percutaneous nephrostomy/nephroureteral catheter placement   Referring Physician(s): Ottelin,M  Supervising Physician: Aletta Edouard  Patient Status: [facility] - Out-pt  TBA  History of Present Illness: [name] is a 73 y.o. female with history of atrial fibrillation on Eliquis, prediabetes, chronic diastolic heart failure, hematuria, back pain and left staghorn calculus who presents today for left percutaneous nephrostomy/nephroureteral catheter placement prior to nephrolithotomy.  Past Medical History:  Diagnosis Date  . Acute hypoxemic respiratory failure ([place]) 10/24/2016  . Arthritis    both knees  . Atrial fibrillation, chronic   . Carpal tunnel syndrome, bilateral    both hands go to sleep at times from elbows down  . Chronic diastolic heart failure ([place])   . DDD (degenerative disc disease), lumbosacral   . Dyspnea    during chronic bronchitis, resolved at this time  . First degree AV block   . Hematuria 11/15/2017  . History of kidney stones   . History of rib fracture 03/2011  . Lumbar stenosis   . Obesity   . Postlaminectomy syndrome   . Pre-diabetes   . Renal calculi    BILATERAL  . Right ureteral stone   . Rotator cuff tear, left   . Rotator cuff tear, right   . UTI (urinary tract infection)     Past Surgical History:  Procedure Laterality Date  . ANTERIOR CERVICAL DECOMP/DISCECTOMY FUSION  03-18-2011   C3 -- C7  . ANTERIOR LATERAL LUMBAR FUSION 4 LEVELS N/A 05/06/2018   Procedure: Lumbar one- two Lumbar two -three Lumbar  three-four Lumbar four-five Anterolateral decompression/fusion/posterior bilateral laminectomy Lumbar four-five Lumbar five Sacral one/posterior percutaneous fixation/mazor;  Surgeon: Kristeen Miss, MD;  Location: [facility];  Service: Neurosurgery;  Laterality: N/A;  . APPENDECTOMY    . APPLICATION OF ROBOTIC ASSISTANCE FOR SPINAL PROCEDURE N/A 05/06/2018   Procedure: APPLICATION OF ROBOTIC ASSISTANCE FOR SPINAL PROCEDURE;  Surgeon: Kristeen Miss, MD;  Location: [facility];  Service: Neurosurgery;  Laterality: N/A;  . BILATERAL CARPAL TUNNEL RELEASE     3 screws in right hand, thumb, middle finger, and index finger,   . CARDIAC CATHETERIZATION N/A 01/16/2015   Procedure: Left Heart Cath and Coronary Angiography;  Surgeon: Belva Crome, MD;  Location: [facility];  Service: Cardiovascular;  Laterality: N/A;  . CHOLECYSTECTOMY OPEN  1970'S  . COLONOSCOPY  2018  . CYSTOSCOPY WITH RETROGRADE PYELOGRAM, URETEROSCOPY AND STENT PLACEMENT N/A 02/13/2013   Procedure: CYSTOSCOPY WITH RETROGRADE PYELOGRAM, URETEROSCOPY AND LITHOTRIPSY ;  Surgeon: Claybon Jabs, MD;  Location: [facility];  Service: Urology;  Laterality: N/A;  . CYSTOSCOPY/URETEROSCOPY/HOLMIUM LASER/STENT PLACEMENT Right 11/22/2017   Procedure: CYSTOSCOPY/URETEROSCOPY/RETROGRADE PYELOGRAM/HOLMIUM LASER/STENT PLACEMENT;  Surgeon: Kathie Rhodes, MD;  Location: [facility];  Service: Urology;  Laterality: Right;  . EXTRACORPOREAL SHOCK WAVE LITHOTRIPSY Right 05-16-2012  . FINGER ARTHROSCOPY WITH CARPOMETACARPEL ([facility]) ARTHROPLASTY    . HOLMIUM LASER APPLICATION Right 01/12/7105   Procedure: HOLMIUM LASER APPLICATION;  Surgeon: Claybon Jabs, MD;  Location: [facility];  Service: Urology;  Laterality: Right;  . KNEE ARTHROSCOPY Bilateral   . LUMBAR LAMINECTOMY  X2  1960'S  . LUMBAR PERCUTANEOUS PEDICLE SCREW 4 LEVEL N/A 05/06/2018   Procedure: LUMBAR PERCUTANEOUS PEDICLE SCREW LUMBAR ONE TO LUMBAR FIVE WITH MAZOR ;POSTERIOR LATERAL LAMINECTOMY LUMBAR FOUR-FIVE,FIVE-SACRAL ONE;  Surgeon: Kristeen Miss, MD;  Location: [facility];  Service: Neurosurgery;  Laterality: N/A;  . NASAL  SEPTUM SURGERY  1970'S  . SHOULDER OPEN ROTATOR CUFF REPAIR Bilateral LEFT 01-11-2001/   RIGHT 07-21-2002  . TOTAL KNEE ARTHROPLASTY  04/13/2012   Procedure: TOTAL KNEE ARTHROPLASTY;   Surgeon: Gearlean Alf, MD;  Location: [facility];  Service: Orthopedics;  Laterality: Left;  . TOTAL KNEE ARTHROPLASTY  08/08/2012   Procedure: TOTAL KNEE ARTHROPLASTY;  Surgeon: Gearlean Alf, MD;  Location: [facility];  Service: Orthopedics;  Laterality: Right;  . TRANSTHORACIC ECHOCARDIOGRAM  03-29-2011   MODERATE LVH/ EF 60-65%/ MILD MR  . VAGINAL HYSTERECTOMY  1960'S    Allergies: Tape  Medications: Prior to Admission medications   Medication Sig Start Date End Date Taking? Authorizing Provider  acetaminophen (TYLENOL) 650 MG CR tablet Take 1,300 mg by mouth every 8 (eight) hours as needed for pain.   Yes [provider]  apixaban (ELIQUIS) 5 MG TABS tablet Take 1 tablet (5 mg total) by mouth 2 (two) times daily. 05/20/18  Yes Angiulli, Lavon Paganini, PA-C  diclofenac sodium (VOLTAREN) 1 % GEL Apply 2 g topically 3 (three) times daily as needed (back pain).  10/14/16  Yes [provider]  diltiazem (CARTIA XT) 120 MG 24 hr capsule Take 1 capsule (120 mg total) by mouth daily. 08/23/18  Yes Belva Crome, MD  montelukast (SINGULAIR) 10 MG tablet Take 1 tablet (10 mg total) by mouth at bedtime. 05/20/18  Yes Angiulli, Lavon Paganini, PA-C  Oxycodone HCl 10 MG TABS One Tablet every 4- 6 hours as needed for Pain Patient taking differently: Take 10 mg by mouth every 6 (six) hours as needed (pain).  05/30/18  Yes Bayard Hugger, NP  sulfamethoxazole-trimethoprim (BACTRIM DS) 800-160 MG tablet Take 1 tablet by mouth 2 (two) times daily.   Yes [provider]  gabapentin (NEURONTIN) 300 MG capsule Take 1 capsule (300 mg total) by mouth 3 (three) times daily. Patient not taking: Reported on 02/06/2019 05/20/18   Angiulli, Lavon Paganini, PA-C     Family History  Problem Relation Age of Onset  . Diabetes type II Mother   . Diabetes Sister   . Hypertension Sister   . Lung cancer Father   . Hypertension Brother   . Diabetes Brother   . Heart disease Brother   . Diabetes Brother   .  Hypertension Brother   . Heart disease Brother     Social History   Socioeconomic History  . Marital status: Married    Spouse name: Not on file  . Number of children: Not on file  . Years of education: Not on file  . Highest education level: Not on file  Occupational History  . Not on file  Social Needs  . Financial resource strain: Not on file  . Food insecurity    Worry: Not on file    Inability: Not on file  . Transportation needs    Medical: Not on file    Non-medical: Not on file  Tobacco Use  . Smoking status: Never Smoker  . Smokeless tobacco: Never Used  Substance and Sexual Activity  . Alcohol use: No  . Drug use: No  . Sexual activity: Not on file  Lifestyle  . Physical activity    Days per week: Not on file    Minutes per session: Not on file  . Stress: Not on file  Relationships  . Social Herbalist on phone: Not on file    Gets together: Not on file    Attends  religious service: Not on file    Active member of club or organization: Not on file    Attends meetings of clubs or organizations: Not on file    Relationship status: Not on file  Other Topics Concern  . Not on file  Social History Narrative  . Not on file      Review of Systems see above; denies fever, chills, headache, chest pain, dyspnea, cough, abdominal pain, nausea, vomiting.   Vital Signs: BP (!) 159/78   Pulse 71   Temp 98.3 F (36.8 C) (Oral)   Resp 16   Ht 5\' 2"  (1.575 m)   Wt 222 lb (100.7 kg)   SpO2 100%   BMI 40.60 kg/m   Physical Exam awake, alert.  Chest clear to auscultation bilaterally.  Heart with regular rate and rhythm.  Abdomen obese, soft, positive bowel sounds, nontender.  Extremities with full range of motion.  Imaging: No results found.  Labs:  CBC: Recent Labs    05/12/18 0722 05/13/18 0519 05/20/18 0434 02/07/19 1039  WBC 12.3* 14.4* 14.5* 6.6  HGB 10.5* 10.0* 10.2* 12.3  HCT 32.8* 32.5* 33.4* 39.0  PLT 351 384 415* 295     COAGS: Recent Labs    04/29/18 0921  INR 1.26    BMP: Recent Labs    05/12/18 0722 05/13/18 0519 09/01/18 0915 02/07/19 1039  NA 137 138 140 135  K 3.7 3.9 4.4 4.9  CL 101 104 101 103  CO2 28 27 23  21*  GLUCOSE 178* 125* 138* 108*  BUN 17 22 17 22   CALCIUM 8.7* 8.4* 9.2 9.0  CREATININE 0.69 0.72 0.79 0.84  GFRNONAA >60 >60 74 >60  GFRAA >60 >60 86 >60    LIVER FUNCTION TESTS: Recent Labs    05/06/18 2247 05/13/18 0519  BILITOT 1.0 1.5*  AST 71* 16  ALT 54* 25  ALKPHOS 79 69  PROT 5.6* 5.6*  ALBUMIN 3.6 3.3*    TUMOR MARKERS: No results for input(s): AFPTM, CEA, CA199, CHROMGRNA in the last 8760 hours.  Assessment and Plan: 73 y.o. female with history of atrial fibrillation on Eliquis, prediabetes, chronic diastolic heart failure, hematuria, back pain and left staghorn calculus who presents today for left percutaneous nephrostomy/nephroureteral catheter placement prior to nephrolithotomy.Risks and benefits of left PCN placement was discussed with the patient including, but not limited to, infection, bleeding, significant bleeding causing loss or decrease in renal function or damage to adjacent structures.   All of the patient's questions were answered, patient is agreeable to proceed.  Consent signed and in chart.      Thank you for this interesting consult.  I greatly enjoyed meeting ERYCA BOLTE and look forward to participating in their care.  A copy of this report was sent to the requesting provider on this date.  Electronically Signed: D. Rowe Robert, PA-C 02/13/2019, 8:55 AM   I spent a total of 25 minutes    in face to face in clinical consultation, greater than 50% of which was counseling/coordinating care for left percutaneous nephrostomy/nephroureteral catheter placement

## 2019-02-13 NOTE — Progress Notes (Signed)
Patient continued to c/o itching and begins shivering stating she is cold and can not relax. Generalized redness all over with no hives noted.  Dr. Jillyn Hidden notified with verbal order given for Decadron.

## 2019-02-13 NOTE — Procedures (Signed)
Interventional Radiology Procedure Note  Procedure: Left percutaneous renal access and ureteral catheter placement  Complications: None  Estimated Blood Loss: < 10 mL  Findings: Large staghorn calculus occupying upper and interpolar components of duplicated collecting system. Unable to pass wire into upper pole after interpolar calyceal access. Eventually puncture performed at infundibular level between upper and interpolar components with successful advancement of a 5 Fr catheter down ureter and into bladder.  For PCNL later in OR today.  Venetia Night. Kathlene Cote, M.D Pager:  [phone]

## 2019-02-13 NOTE — Discharge Instructions (Signed)
Percutaneous Nephrostomy, Care After °This sheet gives you information about how to care for yourself after your procedure. Your health care provider may also give you more specific instructions. If you have problems or questions, contact your health care provider. °What can I expect after the procedure? °After the procedure, it is common to have: °· Some soreness where the nephrostomy tube was inserted (tube insertion site). °· Blood-tinged drainage from the nephrostomy tube for the first 24 hours. °Follow these instructions at home: °Activity °· Return to your normal activities as told by your health care provider. Ask your health care provider what activities are safe for you. °· Avoid activities that may cause the nephrostomy tubing to bend. °· Do not take baths, swim, or use a hot tub until your health care provider approves. Ask your health care provider if you can take showers. Cover the nephrostomy tube dressing with a watertight covering when you take a shower. °· Do not drive for 24 hours if you were given a medicine to help you relax (sedative). °Care of the tube insertion site ° °· Follow instructions from your health care provider about how to take care of your tube insertion site. Make sure you: °? Wash your hands with soap and water before you change your bandage (dressing). If soap and water are not available, use hand sanitizer. °? Change your dressing as told by your health care provider. Be careful not to pull on the tube while removing the dressing. °? When you change the dressing, wash the skin around the tube, rinse well, and pat the skin dry. °· Check the tube insertion area every day for signs of infection. Check for: °? More redness, swelling, or pain. °? More fluid or blood. °? Warmth. °? Pus or a bad smell. °Care of the nephrostomy tube and drainage bag °· Always keep the tubing, the leg bag, or the bedside drainage bags below the level of the kidney so that your urine drains  freely. °· When connecting your nephrostomy tube to a drainage bag, make sure that there are no kinks in the tubing and that your urine is draining freely. You may want to use an elastic bandage to wrap any exposed tubing that goes from the nephrostomy tube to any of the connecting tubes. °· At night, you may want to connect your nephrostomy tube or the leg bag to a larger bedside drainage bag. °· Follow instructions from your health care provider about how to empty or change the drainage bag. °· Empty the drainage bag when it becomes ? full. °· Replace the drainage bag and any extension tubing that is connected to your nephrostomy tube every 3 weeks or as often as told by your health care provider. Your health care provider will explain how to change the drainage bag and extension tubing. °General instructions °· Take over-the-counter and prescription medicines only as told by your health care provider. °· Keep all follow-up visits as told by your health care provider. This is important. °Contact a health care provider if: °· You have problems with any of the valves or tubing. °· You have persistent pain or soreness in your back. °· You have more redness, swelling, or pain around your tube insertion site. °· You have more fluid or blood coming from your tube insertion site. °· Your tube insertion site feels warm to the touch. °· You have pus or a bad smell coming from your tube insertion site. °· You have increased urine output or you feel   burning when urinating. Get help right away if:  You have pain in your abdomen during the first week.  You have chest pain or have trouble breathing.  You have a new appearance of blood in your urine.  You have a fever or chills.  You have back pain that is not relieved by your medicine.  You have decreased urine output.  Your nephrostomy tube comes out. This information is not intended to replace advice given to you by your health care provider. Make sure you  discuss any questions you have with your health care provider. Document Released: 02/20/2004 Document Revised: 06/11/2017 Document Reviewed: 04/10/2016 Elsevier Patient Education  [place]. Moderate Conscious Sedation, Adult, Care After These instructions provide you with information about caring for yourself after your procedure. Your health care provider may also give you more specific instructions. Your treatment has been planned according to current medical practices, but problems sometimes occur. Call your health care provider if you have any problems or questions after your procedure. What can I expect after the procedure? After your procedure, it is common:  To feel sleepy for several hours.  To feel clumsy and have poor balance for several hours.  To have poor judgment for several hours.  To vomit if you eat too soon. Follow these instructions at home: For at least 24 hours after the procedure:   Do not: ? Participate in activities where you could fall or become injured. ? Drive. ? Use heavy machinery. ? Drink alcohol. ? Take sleeping pills or medicines that cause drowsiness. ? Make important decisions or sign legal documents. ? Take care of children on your own.  Rest. Eating and drinking  Follow the diet recommended by your health care provider.  If you vomit: ? Drink water, juice, or soup when you can drink without vomiting. ? Make sure you have little or no nausea before eating solid foods. General instructions  Have a responsible adult stay with you until you are awake and alert.  Take over-the-counter and prescription medicines only as told by your health care provider.  If you smoke, do not smoke without supervision.  Keep all follow-up visits as told by your health care provider. This is important. Contact a health care provider if:  You keep feeling nauseous or you keep vomiting.  You feel light-headed.  You develop a rash.  You have a  fever. Get help right away if:  You have trouble breathing. This information is not intended to replace advice given to you by your health care provider. Make sure you discuss any questions you have with your health care provider. Document Released: 04/19/2013 Document Revised: 06/11/2017 Document Reviewed: 10/19/2015 Elsevier Patient Education  2020 [employer].

## 2019-02-13 NOTE — Consult Note (Signed)
History and Physical  [name] EPP:[id number] DOB: 09-18-1945 DOA: 02/13/2019  PCP: Jani Gravel, MD Patient coming from: Home   I have personally briefly reviewed patient's old medical records in [place]   Chief Complaint: rash, itching. Consult for allergic reaction management.   HPI: [name] is a 73 y.o. female with past medical history significant for degenerative disc disc disease status post surgery 2019, chronic A. fib, chronic diastolic heart failure, first-degree AV block, history of kidney stone who presented for nephrostomy tube placement in her left kidney due to partial staghorn calculus and subsequently PCNL.  Patient developed a rash in the  preop area, she was also complaining of generalized itching.  At some point she had severe tremors and shaking.  No evidence of airway compromise.  Patient received Ancef but also she received Omnipaque contrast. In the preop area patient received 10 mg of IV Decadron, Benadryl 25 mg IV. We were consulted to help with management of allergic reaction. Patient seen and examined and she appears to be comfortable.  She relate that the itchiness has improved.  She still have some generalized upper arms chest and abdomen rash.  She denies tongue swelling, shortness of breath, feeling of throat closing. She is complaining of pain at the site of the nephrostomy tube.   Review of Systems: All systems reviewed and apart from history of presenting illness, are negative.  Past Medical History:  Diagnosis Date   Acute hypoxemic respiratory failure ([place]) 10/24/2016   Arthritis    both knees   Atrial fibrillation, chronic    Carpal tunnel syndrome, bilateral    both hands go to sleep at times from elbows down   Chronic diastolic heart failure (HCC)    DDD (degenerative disc disease), lumbosacral    Dyspnea    during chronic bronchitis, resolved at this time   First degree AV block    Hematuria 11/15/2017    History of kidney stones    History of rib fracture 03/2011   Lumbar stenosis    Obesity    Postlaminectomy syndrome    Pre-diabetes    Renal calculi    BILATERAL   Right ureteral stone    Rotator cuff tear, left    Rotator cuff tear, right    UTI (urinary tract infection)    Past Surgical History:  Procedure Laterality Date   ANTERIOR CERVICAL DECOMP/DISCECTOMY FUSION  03-18-2011   C3 -- C7   ANTERIOR LATERAL LUMBAR FUSION 4 LEVELS N/A 05/06/2018   Procedure: Lumbar one- two Lumbar two -three Lumbar  three-four Lumbar four-five Anterolateral decompression/fusion/posterior bilateral laminectomy Lumbar four-five Lumbar five Sacral one/posterior percutaneous fixation/mazor;  Surgeon: Kristeen Miss, MD;  Location: [facility];  Service: Neurosurgery;  Laterality: N/A;   APPENDECTOMY     APPLICATION OF ROBOTIC ASSISTANCE FOR SPINAL PROCEDURE N/A 05/06/2018   Procedure: APPLICATION OF ROBOTIC ASSISTANCE FOR SPINAL PROCEDURE;  Surgeon: Kristeen Miss, MD;  Location: [facility];  Service: Neurosurgery;  Laterality: N/A;   BILATERAL CARPAL TUNNEL RELEASE     3 screws in right hand, thumb, middle finger, and index finger,    CARDIAC CATHETERIZATION N/A 01/16/2015   Procedure: Left Heart Cath and Coronary Angiography;  Surgeon: Belva Crome, MD;  Location: [facility];  Service: Cardiovascular;  Laterality: N/A;   CHOLECYSTECTOMY OPEN  1970'S   COLONOSCOPY  2018   CYSTOSCOPY WITH RETROGRADE PYELOGRAM, URETEROSCOPY AND STENT PLACEMENT N/A 02/13/2013   Procedure: CYSTOSCOPY WITH RETROGRADE PYELOGRAM,  URETEROSCOPY AND LITHOTRIPSY ;  Surgeon: Claybon Jabs, MD;  Location: [facility];  Service: Urology;  Laterality: N/A;   CYSTOSCOPY/URETEROSCOPY/HOLMIUM LASER/STENT PLACEMENT Right 11/22/2017   Procedure: CYSTOSCOPY/URETEROSCOPY/RETROGRADE PYELOGRAM/HOLMIUM LASER/STENT PLACEMENT;  Surgeon: Kathie Rhodes, MD;  Location: [facility];  Service: Urology;   Laterality: Right;   EXTRACORPOREAL SHOCK WAVE LITHOTRIPSY Right 05-16-2012   FINGER ARTHROSCOPY WITH CARPOMETACARPEL [facility]) ARTHROPLASTY     HOLMIUM LASER APPLICATION Right 01/18/3902   Procedure: HOLMIUM LASER APPLICATION;  Surgeon: Claybon Jabs, MD;  Location[facility];  Service: Urology;  Laterality: Right;   IR URETERAL STENT LEFT NEW ACCESS W/O SEP NEPHROSTOMY CATH  02/13/2019   KNEE ARTHROSCOPY Bilateral    LUMBAR LAMINECTOMY  X2  1960'S   LUMBAR PERCUTANEOUS PEDICLE SCREW 4 LEVEL N/A 05/06/2018   Procedure: LUMBAR PERCUTANEOUS PEDICLE SCREW LUMBAR ONE TO LUMBAR FIVE WITH MAZOR ;POSTERIOR LATERAL LAMINECTOMY LUMBAR FOUR-FIVE,FIVE-SACRAL ONE;  Surgeon: Kristeen Miss, MD;  Location: [place];  Service: Neurosurgery;  Laterality: N/A;   NASAL SEPTUM SURGERY  1970'S   SHOULDER OPEN ROTATOR CUFF REPAIR Bilateral LEFT 01-11-2001/   RIGHT 07-21-2002   TOTAL KNEE ARTHROPLASTY  04/13/2012   Procedure: TOTAL KNEE ARTHROPLASTY;  Surgeon: Gearlean Alf, MD;  Location: [facility];  Service: Orthopedics;  Laterality: Left;   TOTAL KNEE ARTHROPLASTY  08/08/2012   Procedure: TOTAL KNEE ARTHROPLASTY;  Surgeon: Gearlean Alf, MD;  Location: [facility];  Service: Orthopedics;  Laterality: Right;   TRANSTHORACIC ECHOCARDIOGRAM  03-29-2011   MODERATE LVH/ EF 60-65%/ MILD MR   VAGINAL HYSTERECTOMY  1960'S   Social History:  reports that she has never smoked. She has never used smokeless tobacco. She reports that she does not drink alcohol or use drugs.   Allergies  Allergen Reactions   Tape Swelling    Adhesive Tape    Family History  Problem Relation Age of Onset   Diabetes type II Mother    Diabetes Sister    Hypertension Sister    Lung cancer Father    Hypertension Brother    Diabetes Brother    Heart disease Brother    Diabetes Brother    Hypertension Brother    Heart disease Brother       Prior to Admission medications   Medication Sig Start Date End  Date Taking? Authorizing Provider  acetaminophen (TYLENOL) 650 MG CR tablet Take 1,300 mg by mouth every 8 (eight) hours as needed for pain.   Yes [provider]  apixaban (ELIQUIS) 5 MG TABS tablet Take 1 tablet (5 mg total) by mouth 2 (two) times daily. 05/20/18  Yes Angiulli, Lavon Paganini, PA-C  diclofenac sodium (VOLTAREN) 1 % GEL Apply 2 g topically 3 (three) times daily as needed (back pain).  10/14/16  Yes [provider]  diltiazem (CARTIA XT) 120 MG 24 hr capsule Take 1 capsule (120 mg total) by mouth daily. 08/23/18  Yes Belva Crome, MD  montelukast (SINGULAIR) 10 MG tablet Take 1 tablet (10 mg total) by mouth at bedtime. 05/20/18  Yes Angiulli, Lavon Paganini, PA-C  Oxycodone HCl 10 MG TABS One Tablet every 4- 6 hours as needed for Pain Patient taking differently: Take 10 mg by mouth every 6 (six) hours as needed (pain).  05/30/18  Yes Bayard Hugger, NP  sulfamethoxazole-trimethoprim (BACTRIM DS) 800-160 MG tablet Take 1 tablet by mouth 2 (two) times daily.   Yes [provider]  gabapentin (NEURONTIN) 300 MG capsule Take 1  capsule (300 mg total) by mouth 3 (three) times daily. Patient not taking: Reported on 02/06/2019 05/20/18   Cathlyn Parsons, PA-C   Physical Exam: Vitals:   02/13/19 1253 02/13/19 1258 02/13/19 1303 02/13/19 1308  BP: (!) 151/66 (!) 146/81 (!) 154/70 (!) 151/77  Pulse: 90 90 90 85  Resp: 20 19 17 14   Temp:    98.1 F (36.7 C)  TempSrc:      SpO2: 100% 99% 100% 99%  Weight:      Height:         General exam: Moderately built and nourished patient, lying comfortably supine on the gurney in no obvious distress.  Head, eyes and ENT: Nontraumatic and normocephalic. Pupils equally reacting to light and accommodation. Oral mucosa moist.  Neck: Supple. No JVD, carotid bruit or thyromegaly.  Lymphatics: No lymphadenopathy.  Respiratory system: Clear to auscultation. No increased work of breathing.  Cardiovascular system: S1 and S2 heard,  RRR. No JVD, murmurs, gallops, clicks or pedal edema.  Gastrointestinal system: Abdomen is nondistended, soft and nontender. Normal bowel sounds heard. No organomegaly or masses appreciated.  Central nervous system: Alert and oriented. No focal neurological deficits.  Extremities: Symmetric 5 x 5 power. Peripheral pulses symmetrically felt.   Skin; generalized macular rash on arms, chest, abdomen less pronounced lower extremity.  Musculoskeletal system: Negative exam.  Psychiatry: Pleasant and cooperative.   Labs on Admission:  Basic Metabolic Panel: Recent Labs  Lab 02/07/19 1039 02/13/19 0811  NA 135 142  K 4.9 3.9  CL 103 105  CO2 21* 26  GLUCOSE 108* 114*  BUN 22 19  CREATININE 0.84 0.71  CALCIUM 9.0 8.9   Liver Function Tests: No results for input(s): AST, ALT, ALKPHOS, BILITOT, PROT, ALBUMIN in the last 168 hours. No results for input(s): LIPASE, AMYLASE in the last 168 hours. No results for input(s): AMMONIA in the last 168 hours. CBC: Recent Labs  Lab 02/07/19 1039 02/13/19 0811  WBC 6.6 5.8  NEUTROABS  --  3.5  HGB 12.3 11.2*  HCT 39.0 36.0  MCV 92.2 91.6  PLT 295 294   Cardiac Enzymes: No results for input(s): CKTOTAL, CKMB, CKMBINDEX, TROPONINI in the last 168 hours.  BNP (last 3 results) No results for input(s): PROBNP in the last 8760 hours. CBG: No results for input(s): GLUCAP in the last 168 hours.  Radiological Exams on Admission: Ir Ureteral Stent Left New Access W/o Sep Nephrostomy Cath  Result Date: 02/13/2019 CLINICAL DATA:  Staghorn calculus of the left kidney requiring percutaneous access prior to planned operative percutaneous nephrolithotomy. EXAM: 1. ULTRASOUND GUIDANCE FOR PUNCTURE OF THE LEFT RENAL COLLECTING SYSTEM. 2. LEFT PERCUTANEOUS URETERAL CATHETER PLACEMENT. COMPARISON:  CT of the abdomen and pelvis without contrast on 07/12/2018 ANESTHESIA/SEDATION: 5.0 mg IV Versed; 150 mcg IV Fentanyl. Total Moderate Sedation Time 32  minutes. The patient's level of consciousness and physiologic status were continuously monitored during the procedure by Radiology nursing. CONTRAST:  20 ml Omnipaque 300 MEDICATIONS: 2 g IV Ancef. Antibiotic was administered in an appropriate time frame prior to skin puncture. FLUOROSCOPY TIME:  14 minutes.  583.8 mGy. PROCEDURE: The procedure, risks, benefits, and alternatives were explained to the patient. Questions regarding the procedure were encouraged and answered. The patient understands and consents to the procedure. A time-out was performed prior to initiating the procedure. The left flank region was prepped with chlorhexidine in a sterile fashion, and a sterile drape was applied covering the operative field. A sterile gown and sterile  gloves were used for the procedure. Local anesthesia was provided with 1% Lidocaine. Initial puncture was performed at the level of calculus occupying a mid pole calyx under fluoroscopy with a 21 gauge needle. Contrast injection was performed. Different 0.018 inch guidewires were then advanced through the needle under fluoroscopy. Additional puncture was then performed under fluoroscopy near the level of an infundibulum between upper pole component and interpolar components of a staghorn calculus. Contrast injection was performed. A guidewire was advanced through the needle. A transitional dilator was advanced over a guidewire. A 5-French catheter was then advanced into the collecting system, and down the ureter and into the bladder. The catheter was capped. The catheter was secured at the skin with a silk retention suture. COMPLICATIONS: None. FINDINGS: Initial fluoroscopy demonstrates a large staghorn renal calculus occupying the entire upper pole and interpolar collecting systems of the left kidney. After puncture of an interpolar calyx containing calculus, contrast injection demonstrates a duplicated collecting system with an isolated lower pole collecting system of  endoleak communicating with a common ureter. Various guidewires could not be advanced beyond the interpolar component of the staghorn calculus and into the ureter. Additional higher puncture was then performed near the base of the infundibulum connecting upper pole and interpolar components. This allowed catheter access down the ureter and into the bladder. IMPRESSION: Percutaneous renal access on the left for operative nephrolithotomy. A large staghorn calculus occupies the entire upper and interpolar collecting systems with duplication of the collecting system and an isolated lower pole collecting system. A guidewire would not advance through the interpolar component of the calculus and therefore a higher puncture was performed allowing percutaneous access and catheter advancement. Electronically Signed   By: Aletta Edouard M.D.   On: 02/13/2019 11:18     Assessment/Plan Active Problems:   Hydronephrosis of right kidney   Chronic diastolic heart failure (HCC)   Paroxysmal atrial fibrillation (HCC)   Allergic reaction   1-Allergic reaction; could be related to contrast Omnipaque versus Ancef. Admitted to the hospital for observation. IV Solu-Medrol 60 mg 3 times daily. IV Pepcid 20 mg twice daily. Benadryl as needed. IV fluids.  2-paroxysmal A. fib: Continue with Cardizem Discussed with Dr. Karsten Ro Hold Eliquis for at least 48 hours  3-chronic diastolic heart failure Appears compensated.  4-kidney stone staghorn; Status post left-sided nephrostomy Further management per urology. Oxycodone PRN for pain  DVT Prophylaxis: SCDs Code Status: Full code Family Communication: Primary Disposition Plan: Admit under observation to monitor for fever worsening of allergic reaction  Time spent: 75 minutes.      Elmarie Shiley MD [facility]   02/13/2019, 2:12 PM

## 2019-02-13 NOTE — Progress Notes (Signed)
Report given to Baxter Flattery, Therapist, sports.  Patient transported to room 1414 with no issues noted.

## 2019-02-13 NOTE — Progress Notes (Signed)
Patient continues to c/o itching of arms and chest.  No hives, no respiratory distress noted.  Verbal order for 5mg  Nubaine however pharmacy does not have medication in stock.  Order for 12.5mg  IV Benadryl obtained from Dr. Jillyn Hidden and given at 11:36

## 2019-02-13 NOTE — Progress Notes (Signed)
Dr. Karsten Ro notified patient status by Dr. Jillyn Hidden.  Surgical procedure cancelled.  Patient to be admitted for observation.

## 2019-02-13 NOTE — Sedation Documentation (Signed)
Patient c/o itching on back of legs. Flushing of skin noted on legs, arms, and chest. No hives noted. No respiratory distress. Order for Benadryl 25 mg IV x1 obtained from Dr Kathlene Cote and given at 1105. Patient now states itching subsiding.

## 2019-02-14 DIAGNOSIS — N132 Hydronephrosis with renal and ureteral calculous obstruction: Secondary | ICD-10-CM | POA: Diagnosis not present

## 2019-02-14 DIAGNOSIS — R310 Gross hematuria: Secondary | ICD-10-CM | POA: Diagnosis not present

## 2019-02-14 DIAGNOSIS — Z7901 Long term (current) use of anticoagulants: Secondary | ICD-10-CM | POA: Diagnosis not present

## 2019-02-14 LAB — CBC
HCT: 35.3 % — ABNORMAL LOW (ref 36.0–46.0)
Hemoglobin: 10.9 g/dL — ABNORMAL LOW (ref 12.0–15.0)
MCH: 29.1 pg (ref 26.0–34.0)
MCHC: 30.9 g/dL (ref 30.0–36.0)
MCV: 94.1 fL (ref 80.0–100.0)
Platelets: 176 10*3/uL (ref 150–400)
RBC: 3.75 MIL/uL — ABNORMAL LOW (ref 3.87–5.11)
RDW: 12.2 % (ref 11.5–15.5)
WBC: 15.8 10*3/uL — ABNORMAL HIGH (ref 4.0–10.5)
nRBC: 0 % (ref 0.0–0.2)

## 2019-02-14 LAB — BASIC METABOLIC PANEL
Anion gap: 10 (ref 5–15)
BUN: 21 mg/dL (ref 8–23)
CO2: 22 mmol/L (ref 22–32)
Calcium: 8.6 mg/dL — ABNORMAL LOW (ref 8.9–10.3)
Chloride: 105 mmol/L (ref 98–111)
Creatinine, Ser: 0.88 mg/dL (ref 0.44–1.00)
GFR calc Af Amer: >60 mL/min (ref >60)
GFR calc non Af Amer: >60 mL/min (ref >60)
Glucose, Bld: 215 mg/dL — ABNORMAL HIGH (ref 70–99)
Potassium: 5.4 mmol/L — ABNORMAL HIGH (ref 3.5–5.1)
Sodium: 137 mmol/L (ref 135–145)

## 2019-02-14 MED ORDER — ACETAMINOPHEN 325 MG PO TABS
650.0000 mg | ORAL_TABLET | Freq: Once | ORAL | Status: AC
  Administered 2019-02-14: 650 mg via ORAL
  Filled 2019-02-14: qty 2

## 2019-02-14 MED ORDER — LORATADINE 10 MG PO TABS
10.0000 mg | ORAL_TABLET | Freq: Every day | ORAL | Status: DC
  Administered 2019-02-14: 10 mg via ORAL
  Filled 2019-02-14: qty 1

## 2019-02-14 NOTE — Progress Notes (Signed)
All patients discharge information discussed with patient, including medications, nephrostomy tube, and follow up plan. IV removed. Patient had no further questions. Husband called to pick patient up.

## 2019-02-14 NOTE — Discharge Summary (Signed)
Physician Discharge Summary      Patient ID: [name] MRN: [id number] DOB/AGE: August 28, 1945 73 y.o.  Admit date: 02/13/2019 Discharge date: 02/14/2019  Admission Diagnoses: LEFT STAGHORN CALCULUS  Discharge Diagnoses:  Active Problems:   Hydronephrosis of right kidney   Chronic diastolic heart failure (HCC)   Paroxysmal atrial fibrillation (HCC)   Allergic reaction   Discharged Condition: good  Hospital Course: She was admitted to the hospital after she underwent a left nephrostomy tube placement in preparation for a planned PCNL the same day.  She developed significant urticaria and a whole-body rash but did not develop any shortness of breath or other respiratory symptoms.  In addition her vital signs remained stable with no decrease in blood pressure or tachycardia.  It was felt she had a reaction to the contrast as she also had received Ancef but had received this multiple times in the past without difficulty. She received intravenous fluids, Benadryl and steroids which resulted in resolution of her rash and urticaria.  She was observed overnight with no incident.  She is doing well at this time.  She has a nephrostomy tube in place that is capped.  She is tolerating the tube and is currently not having any significant pain at the nephrostomy tube site and indicated that she would not need any pain medication upon discharge.  She is having some slight dysuria secondary to the nephro ureteral catheter that is in place.  She takes Eliquis but is still having gross hematuria but no clots so we will need to remain off of her Eliquis for another 24 hours and then will contact me at the office if she continues to have hematuria prior to starting her Eliquis. At this time she is felt to be ready for discharge and will be rescheduled for her left PCNL.  I will also place her on preoperative antibiotics prior to her surgery.  Of note she has surgery on her neck which results in difficulty  swallowing pills and therefore she does best with liquid antibiotics or small pills or capsules that can be mixed with pudding or applesauce.  Discharge Exam: Blood pressure (!) 169/74, pulse 70, temperature 98.4 F (36.9 C), temperature source Oral, resp. rate 18, height 5\' 2"  (1.575 m), weight 104.3 kg, SpO2 96 %. General: Awake, alert and in no apparent distress. Chest: Normal respiratory effort. Cardiovascular: Regular rate and rhythm. Abdomen: Soft, nontender, nondistended. Back: Her nephrostomy tube site looks good.  There is no drainage.  The tube is capped.   Disposition: She will be discharged home.   Allergies as of 02/14/2019      Reactions   Contrast Media [iodinated Diagnostic Agents]    Omnipaque. Rash, itching   Ancef [cefazolin] Rash   Rash to ancef vs omniopaue contrast   Tape Swelling   Adhesive Tape      Medication List    TAKE these medications   acetaminophen 650 MG CR tablet Commonly known as: TYLENOL Take 1,300 mg by mouth every 8 (eight) hours as needed for pain.   apixaban 5 MG Tabs tablet Commonly known as: Eliquis Take 1 tablet (5 mg total) by mouth 2 (two) times daily.   diclofenac sodium 1 % Gel Commonly known as: VOLTAREN Apply 2 g topically 3 (three) times daily as needed (back pain).   diltiazem 120 MG 24 hr capsule Commonly known as: Cartia XT Take 1 capsule (120 mg total) by mouth daily.   gabapentin 300 MG capsule Commonly known as:  NEURONTIN Take 1 capsule (300 mg total) by mouth 3 (three) times daily.   montelukast 10 MG tablet Commonly known as: SINGULAIR Take 1 tablet (10 mg total) by mouth at bedtime.   Oxycodone HCl 10 MG Tabs One Tablet every 4- 6 hours as needed for Pain What changed:   how much to take  how to take this  when to take this  reasons to take this  additional instructions   sulfamethoxazole-trimethoprim 800-160 MG tablet Commonly known as: BACTRIM DS Take 1 tablet by mouth 2 (two) times daily.       Follow-up Information    [facility].   Why: My office will be contacting you to reschedule your surgery. Contact information: [place] [phone]          Signed: Claybon Jabs 02/14/2019, 6:55 AM

## 2019-02-17 ENCOUNTER — Other Ambulatory Visit: Payer: Self-pay | Admitting: Urology

## 2019-02-19 NOTE — Discharge Instructions (Signed)

## 2019-02-20 ENCOUNTER — Encounter (HOSPITAL_COMMUNITY): Payer: Self-pay | Admitting: *Deleted

## 2019-02-20 ENCOUNTER — Ambulatory Visit (HOSPITAL_COMMUNITY): Payer: PPO | Admitting: Anesthesiology

## 2019-02-20 ENCOUNTER — Encounter (HOSPITAL_COMMUNITY)
Admission: RE | Disposition: A | Discharge status: Home / Self Care | Payer: Self-pay | Source: Home / Self Care | Attending: Urology

## 2019-02-20 ENCOUNTER — Ambulatory Visit (HOSPITAL_COMMUNITY): Payer: PPO

## 2019-02-20 ENCOUNTER — Other Ambulatory Visit: Payer: Self-pay

## 2019-02-20 ENCOUNTER — Other Ambulatory Visit: Payer: Self-pay | Admitting: Urology

## 2019-02-20 ENCOUNTER — Ambulatory Visit (HOSPITAL_COMMUNITY)
Admission: RE | Admit: 2019-02-20 | Discharge: 2019-02-21 | Disposition: A | Discharge status: Home / Self Care | Payer: PPO | Attending: Urology | Admitting: Urology

## 2019-02-20 DIAGNOSIS — F419 Anxiety disorder, unspecified: Secondary | ICD-10-CM | POA: Diagnosis not present

## 2019-02-20 DIAGNOSIS — N200 Calculus of kidney: Secondary | ICD-10-CM | POA: Diagnosis not present

## 2019-02-20 DIAGNOSIS — I5032 Chronic diastolic (congestive) heart failure: Secondary | ICD-10-CM | POA: Diagnosis not present

## 2019-02-20 DIAGNOSIS — Z20828 Contact with and (suspected) exposure to other viral communicable diseases: Secondary | ICD-10-CM | POA: Diagnosis not present

## 2019-02-20 DIAGNOSIS — I509 Heart failure, unspecified: Secondary | ICD-10-CM | POA: Insufficient documentation

## 2019-02-20 DIAGNOSIS — Z7901 Long term (current) use of anticoagulants: Secondary | ICD-10-CM | POA: Diagnosis not present

## 2019-02-20 DIAGNOSIS — Z6841 Body Mass Index (BMI) 40.0 and over, adult: Secondary | ICD-10-CM | POA: Insufficient documentation

## 2019-02-20 DIAGNOSIS — Z79891 Long term (current) use of opiate analgesic: Secondary | ICD-10-CM | POA: Diagnosis not present

## 2019-02-20 DIAGNOSIS — I110 Hypertensive heart disease with heart failure: Secondary | ICD-10-CM | POA: Insufficient documentation

## 2019-02-20 DIAGNOSIS — I4891 Unspecified atrial fibrillation: Secondary | ICD-10-CM | POA: Diagnosis not present

## 2019-02-20 DIAGNOSIS — Z79899 Other long term (current) drug therapy: Secondary | ICD-10-CM | POA: Diagnosis not present

## 2019-02-20 HISTORY — PX: NEPHROLITHOTOMY: SHX5134

## 2019-02-20 LAB — COMPREHENSIVE METABOLIC PANEL
ALT: 34 U/L (ref 0–44)
AST: 23 U/L (ref 15–41)
Albumin: 3.1 g/dL — ABNORMAL LOW (ref 3.5–5.0)
Alkaline Phosphatase: 152 U/L — ABNORMAL HIGH (ref 38–126)
Anion gap: 12 (ref 5–15)
BUN: 22 mg/dL (ref 8–23)
CO2: 26 mmol/L (ref 22–32)
Calcium: 8.3 mg/dL — ABNORMAL LOW (ref 8.9–10.3)
Chloride: 95 mmol/L — ABNORMAL LOW (ref 98–111)
Creatinine, Ser: 1.17 mg/dL — ABNORMAL HIGH (ref 0.44–1.00)
GFR calc Af Amer: 53 mL/min — ABNORMAL LOW (ref >60)
GFR calc non Af Amer: 46 mL/min — ABNORMAL LOW (ref >60)
Glucose, Bld: 173 mg/dL — ABNORMAL HIGH (ref 70–99)
Potassium: 3.1 mmol/L — ABNORMAL LOW (ref 3.5–5.1)
Sodium: 133 mmol/L — ABNORMAL LOW (ref 135–145)
Total Bilirubin: 2.2 mg/dL — ABNORMAL HIGH (ref 0.3–1.2)
Total Protein: 6.8 g/dL (ref 6.5–8.1)

## 2019-02-20 LAB — PROTIME-INR
INR: 1.2 (ref 0.8–1.2)
Prothrombin Time: 14.6 seconds (ref 11.4–15.2)

## 2019-02-20 LAB — SARS CORONAVIRUS 2 BY RT PCR (HOSPITAL ORDER, PERFORMED IN ~~LOC~~ HOSPITAL LAB): SARS Coronavirus 2: NEGATIVE

## 2019-02-20 SURGERY — NEPHROLITHOTOMY PERCUTANEOUS
Anesthesia: General | Laterality: Left

## 2019-02-20 MED ORDER — ACETAMINOPHEN 500 MG PO TABS
1000.0000 mg | ORAL_TABLET | Freq: Four times a day (QID) | ORAL | Status: AC
  Administered 2019-02-20 – 2019-02-21 (×4): 1000 mg via ORAL
  Filled 2019-02-20 (×4): qty 2

## 2019-02-20 MED ORDER — LACTATED RINGERS IV SOLN
INTRAVENOUS | Status: DC
  Administered 2019-02-20: 10:00:00 via INTRAVENOUS

## 2019-02-20 MED ORDER — PROPOFOL 10 MG/ML IV BOLUS
INTRAVENOUS | Status: AC
  Filled 2019-02-20: qty 20

## 2019-02-20 MED ORDER — OXYCODONE HCL 5 MG PO TABS: 5.0000 mg | ORAL_TABLET | Freq: Once | ORAL | Status: DC | PRN

## 2019-02-20 MED ORDER — FENTANYL CITRATE (PF) 100 MCG/2ML IJ SOLN
INTRAMUSCULAR | Status: DC | PRN
  Administered 2019-02-20 (×3): 50 ug via INTRAVENOUS

## 2019-02-20 MED ORDER — HYDROMORPHONE HCL 1 MG/ML IJ SOLN
0.2500 mg | INTRAMUSCULAR | Status: DC | PRN
  Administered 2019-02-20 (×4): 0.5 mg via INTRAVENOUS

## 2019-02-20 MED ORDER — MIDAZOLAM HCL 2 MG/2ML IJ SOLN
INTRAMUSCULAR | Status: AC
  Filled 2019-02-20: qty 2

## 2019-02-20 MED ORDER — SODIUM CHLORIDE 0.9 % IV SOLN
INTRAVENOUS | Status: DC | PRN
  Administered 2019-02-20: 12:00:00 50 ug/min via INTRAVENOUS

## 2019-02-20 MED ORDER — ONDANSETRON HCL 4 MG/2ML IJ SOLN: 4.0000 mg | Freq: Once | INTRAMUSCULAR | Status: DC | PRN

## 2019-02-20 MED ORDER — MIDAZOLAM HCL 5 MG/5ML IJ SOLN
INTRAMUSCULAR | Status: DC | PRN
  Administered 2019-02-20: 1 mg via INTRAVENOUS

## 2019-02-20 MED ORDER — ACETAMINOPHEN 160 MG/5ML PO SOLN: 325.0000 mg | ORAL | Status: DC | PRN

## 2019-02-20 MED ORDER — SUGAMMADEX SODIUM 500 MG/5ML IV SOLN
INTRAVENOUS | Status: DC | PRN
  Administered 2019-02-20: 400 mg via INTRAVENOUS

## 2019-02-20 MED ORDER — DEXMEDETOMIDINE HCL IN NACL 200 MCG/50ML IV SOLN
INTRAVENOUS | Status: AC
  Filled 2019-02-20: qty 50

## 2019-02-20 MED ORDER — FENTANYL CITRATE (PF) 100 MCG/2ML IJ SOLN
INTRAMUSCULAR | Status: AC
  Filled 2019-02-20: qty 2

## 2019-02-20 MED ORDER — DIPHENHYDRAMINE HCL 50 MG/ML IJ SOLN
INTRAMUSCULAR | Status: DC | PRN
  Administered 2019-02-20: 25 mg via INTRAVENOUS

## 2019-02-20 MED ORDER — LIDOCAINE 2% (20 MG/ML) 5 ML SYRINGE
INTRAMUSCULAR | Status: DC | PRN
  Administered 2019-02-20: 100 mg via INTRAVENOUS

## 2019-02-20 MED ORDER — PHENYLEPHRINE 40 MCG/ML (10ML) SYRINGE FOR IV PUSH (FOR BLOOD PRESSURE SUPPORT)
PREFILLED_SYRINGE | INTRAVENOUS | Status: DC | PRN
  Administered 2019-02-20: 80 ug via INTRAVENOUS
  Administered 2019-02-20: 120 ug via INTRAVENOUS

## 2019-02-20 MED ORDER — PIPERACILLIN-TAZOBACTAM 3.375 G IVPB
3.3750 g | Freq: Three times a day (TID) | INTRAVENOUS | Status: DC
  Administered 2019-02-20 – 2019-02-21 (×2): 3.375 g via INTRAVENOUS
  Filled 2019-02-20 (×2): qty 50

## 2019-02-20 MED ORDER — BUPIVACAINE HCL (PF) 0.25 % IJ SOLN
INTRAMUSCULAR | Status: AC
  Filled 2019-02-20: qty 30

## 2019-02-20 MED ORDER — FENTANYL CITRATE (PF) 250 MCG/5ML IJ SOLN
INTRAMUSCULAR | Status: AC
  Filled 2019-02-20: qty 5

## 2019-02-20 MED ORDER — HYDROMORPHONE HCL 1 MG/ML IJ SOLN
INTRAMUSCULAR | Status: AC
  Filled 2019-02-20: qty 1

## 2019-02-20 MED ORDER — PROPOFOL 10 MG/ML IV BOLUS
INTRAVENOUS | Status: DC | PRN
  Administered 2019-02-20: 200 mg via INTRAVENOUS

## 2019-02-20 MED ORDER — OXYCODONE-ACETAMINOPHEN 5-325 MG PO TABS
1.0000 | ORAL_TABLET | ORAL | Status: DC | PRN
  Administered 2019-02-20: 1 via ORAL
  Filled 2019-02-20: qty 1

## 2019-02-20 MED ORDER — DEXAMETHASONE SODIUM PHOSPHATE 10 MG/ML IJ SOLN
INTRAMUSCULAR | Status: DC | PRN
  Administered 2019-02-20: 10 mg via INTRAVENOUS

## 2019-02-20 MED ORDER — ACETAMINOPHEN 325 MG PO TABS: 325.0000 mg | ORAL_TABLET | ORAL | Status: DC | PRN

## 2019-02-20 MED ORDER — PIPERACILLIN-TAZOBACTAM 3.375 G IVPB 30 MIN
3.3750 g | Freq: Once | INTRAVENOUS | Status: AC
  Administered 2019-02-20: 11:00:00 3.375 g via INTRAVENOUS
  Filled 2019-02-20 (×2): qty 50

## 2019-02-20 MED ORDER — FAMOTIDINE 20 MG PO TABS
20.0000 mg | ORAL_TABLET | Freq: Once | ORAL | Status: AC
  Administered 2019-02-20: 20 mg via ORAL
  Filled 2019-02-20: qty 1

## 2019-02-20 MED ORDER — SODIUM CHLORIDE 0.9 % IV SOLN
INTRAVENOUS | Status: DC
  Administered 2019-02-20 (×3): via INTRAVENOUS

## 2019-02-20 MED ORDER — MEPERIDINE HCL 50 MG/ML IJ SOLN: 6.2500 mg | INTRAMUSCULAR | Status: DC | PRN

## 2019-02-20 MED ORDER — SODIUM CHLORIDE 0.9 % IR SOLN
Status: DC | PRN
  Administered 2019-02-20: 18000 mL

## 2019-02-20 MED ORDER — HYDROMORPHONE HCL 1 MG/ML IJ SOLN
0.5000 mg | INTRAMUSCULAR | Status: DC | PRN
  Administered 2019-02-21 (×2): 1 mg via INTRAVENOUS
  Filled 2019-02-20 (×2): qty 1

## 2019-02-20 MED ORDER — ROCURONIUM BROMIDE 50 MG/5ML IV SOSY
PREFILLED_SYRINGE | INTRAVENOUS | Status: DC | PRN
  Administered 2019-02-20: 50 mg via INTRAVENOUS

## 2019-02-20 MED ORDER — ONDANSETRON HCL 4 MG/2ML IJ SOLN: 4.0000 mg | INTRAMUSCULAR | Status: DC | PRN

## 2019-02-20 MED ORDER — ONDANSETRON HCL 4 MG/2ML IJ SOLN
INTRAMUSCULAR | Status: DC | PRN
  Administered 2019-02-20: 4 mg via INTRAVENOUS

## 2019-02-20 MED ORDER — SUCCINYLCHOLINE CHLORIDE 200 MG/10ML IV SOSY
PREFILLED_SYRINGE | INTRAVENOUS | Status: DC | PRN
  Administered 2019-02-20: 160 mg via INTRAVENOUS

## 2019-02-20 MED ORDER — FAMOTIDINE 20 MG PO TABS
ORAL_TABLET | ORAL | Status: AC
  Filled 2019-02-20: qty 1

## 2019-02-20 MED ORDER — FENTANYL CITRATE (PF) 100 MCG/2ML IJ SOLN
25.0000 ug | INTRAMUSCULAR | Status: DC | PRN
  Administered 2019-02-20 (×2): 50 ug via INTRAVENOUS

## 2019-02-20 MED ORDER — BACITRACIN-NEOMYCIN-POLYMYXIN 400-5-5000 EX OINT: 1.0000 "application " | TOPICAL_OINTMENT | Freq: Three times a day (TID) | CUTANEOUS | Status: DC | PRN

## 2019-02-20 MED ORDER — OXYBUTYNIN CHLORIDE 5 MG PO TABS: 5.0000 mg | ORAL_TABLET | Freq: Three times a day (TID) | ORAL | Status: DC | PRN

## 2019-02-20 MED ORDER — DEXMEDETOMIDINE HCL 200 MCG/2ML IV SOLN
INTRAVENOUS | Status: DC | PRN
  Administered 2019-02-20 (×2): 4 ug via INTRAVENOUS

## 2019-02-20 MED ORDER — BUPIVACAINE HCL (PF) 0.25 % IJ SOLN
INTRAMUSCULAR | Status: DC | PRN
  Administered 2019-02-20: 10 mL

## 2019-02-20 MED ORDER — DIPHENHYDRAMINE HCL 50 MG/ML IJ SOLN
INTRAMUSCULAR | Status: AC
  Filled 2019-02-20: qty 1

## 2019-02-20 MED ORDER — DEXMEDETOMIDINE HCL IN NACL 400 MCG/100ML IV SOLN: INTRAVENOUS | Status: DC | PRN

## 2019-02-20 MED ORDER — ONDANSETRON HCL 4 MG/2ML IJ SOLN
4.0000 mg | Freq: Once | INTRAMUSCULAR | Status: AC
  Administered 2019-02-20: 4 mg via INTRAVENOUS
  Filled 2019-02-20: qty 2

## 2019-02-20 MED ORDER — OXYCODONE HCL 5 MG/5ML PO SOLN: 5.0000 mg | Freq: Once | ORAL | Status: DC | PRN

## 2019-02-20 SURGICAL SUPPLY — 50 items
APPLICATOR SURGIFLO ENDO (HEMOSTASIS) IMPLANT
BAG URINE DRAINAGE (UROLOGICAL SUPPLIES) ×2 IMPLANT
BASKET ZERO TIP NITINOL 2.4FR (BASKET) IMPLANT
BENZOIN TINCTURE PRP APPL 2/3 (GAUZE/BANDAGES/DRESSINGS) ×2 IMPLANT
BLADE SURG 15 STRL LF DISP TIS (BLADE) ×1 IMPLANT
BLADE SURG 15 STRL SS (BLADE) ×1
CATH COUNCIL 22FR (CATHETERS) ×2 IMPLANT
CATH FOLEY 2W COUNCIL 20FR 5CC (CATHETERS) IMPLANT
CATH FOLEY 2WAY SLVR  5CC 18FR (CATHETERS)
CATH FOLEY 2WAY SLVR 5CC 18FR (CATHETERS) IMPLANT
CATH IMAGER II 65CM (CATHETERS) ×2 IMPLANT
CATH URET DUAL LUMEN 6-10FR 50 (CATHETERS) ×2 IMPLANT
CATH X-FORCE N30 NEPHROSTOMY (TUBING) ×2 IMPLANT
CHLORAPREP W/TINT 26 (MISCELLANEOUS) ×2 IMPLANT
COVER SURGICAL LIGHT HANDLE (MISCELLANEOUS) ×2 IMPLANT
COVER WAND RF STERILE (DRAPES) IMPLANT
DRAPE C-ARM 42X120 X-RAY (DRAPES) ×2 IMPLANT
DRAPE LINGEMAN PERC (DRAPES) ×2 IMPLANT
DRAPE SURG IRRIG POUCH 19X23 (DRAPES) ×2 IMPLANT
DRSG PAD ABDOMINAL 8X10 ST (GAUZE/BANDAGES/DRESSINGS) ×2 IMPLANT
DRSG TEGADERM 8X12 (GAUZE/BANDAGES/DRESSINGS) ×2 IMPLANT
FIBER LASER TRAC TIP (UROLOGICAL SUPPLIES) IMPLANT
GAUZE SPONGE 4X4 12PLY STRL (GAUZE/BANDAGES/DRESSINGS) ×2 IMPLANT
GLOVE BIOGEL M 8.0 STRL (GLOVE) ×2 IMPLANT
GOWN STRL REUS W/TWL XL LVL3 (GOWN DISPOSABLE) ×2 IMPLANT
GUIDEWIRE AMPLAZ .035X145 (WIRE) ×4 IMPLANT
GUIDEWIRE STR DUAL SENSOR (WIRE) IMPLANT
HOLDER FOLEY CATH W/STRAP (MISCELLANEOUS) ×2 IMPLANT
KIT BASIN OR (CUSTOM PROCEDURE TRAY) ×2 IMPLANT
KIT PROBE 340X3.4XDISP GRN (MISCELLANEOUS) ×2 IMPLANT
KIT PROBE TRILOGY 3.4X340 (MISCELLANEOUS) ×2
KIT PROBE TRILOGY 3.9X350 (MISCELLANEOUS) ×2 IMPLANT
KIT TURNOVER KIT A (KITS) IMPLANT
MANIFOLD NEPTUNE II (INSTRUMENTS) ×2 IMPLANT
NS IRRIG 1000ML POUR BTL (IV SOLUTION) ×2 IMPLANT
PACK CYSTO (CUSTOM PROCEDURE TRAY) ×2 IMPLANT
SHEATH PEELAWAY SET 9 (SHEATH) ×2 IMPLANT
SPONGE LAP 4X18 RFD (DISPOSABLE) ×2 IMPLANT
STENT URET 6FRX24 CONTOUR (STENTS) ×2 IMPLANT
STOPCOCK 4 WAY LG BORE MALE ST (IV SETS) ×2 IMPLANT
SURGIFLO W/THROMBIN 8M KIT (HEMOSTASIS) IMPLANT
SUT MNCRL AB 4-0 PS2 18 (SUTURE) IMPLANT
SUT SILK 2 0 30  PSL (SUTURE)
SUT SILK 2 0 30 PSL (SUTURE) IMPLANT
SYR 10ML LL (SYRINGE) ×2 IMPLANT
SYR 20ML LL LF (SYRINGE) ×4 IMPLANT
TOWEL OR 17X26 10 PK STRL BLUE (TOWEL DISPOSABLE) ×2 IMPLANT
TOWEL OR NON WOVEN STRL DISP B (DISPOSABLE) ×2 IMPLANT
TRAY FOLEY MTR SLVR 16FR STAT (SET/KITS/TRAYS/PACK) ×2 IMPLANT
TUBING CONNECTING 10 (TUBING) ×4 IMPLANT

## 2019-02-20 NOTE — Brief Op Note (Signed)
02/20/2019  1:39 PM  PATIENT:  [name]  73 y.o. female  PRE-OPERATIVE DIAGNOSIS:  LEFT STAGHORN CALCULUS  POST-OPERATIVE DIAGNOSIS:  LEFT STAGHORN CALCULUS  PROCEDURE:  Procedure(s): NEPHROLITHOTOMY PERCUTANEOUS (Left)  SURGEON:  Surgeon(s) and Role:    * Kathie Rhodes, MD - Primary Tharon Aquas MD - Assisting  ANESTHESIA:   general  EBL:100  BLOOD ADMINISTERED:none  DRAINS: 74fr council neph tube, [place] as nephroureteral stent   LOCAL MEDICATIONS USED:  MARCAINE    and Amount: 10 ml  SPECIMEN:  Stone  DISPOSITION OF SPECIMEN:  Patient  COUNTS:  YES  TOURNIQUET:  * No tourniquets in log *  DICTATION: .Dragon Dictation  PLAN OF CARE: Admit to inpatient   PATIENT DISPOSITION:  PACU - hemodynamically stable.   Delay start of Pharmacological VTE agent (>24hrs) due to surgical blood loss or risk of bleeding: yes

## 2019-02-20 NOTE — Anesthesia Preprocedure Evaluation (Deleted)
Anesthesia Evaluation  Patient identified by MRN, date of birth, ID band Patient awake    Reviewed: Allergy & Precautions, NPO status , Patient's Chart, lab work & pertinent test results  Airway Mallampati: II       Dental no notable dental hx. (+) Teeth Intact   Pulmonary    Pulmonary exam normal breath sounds clear to auscultation       Cardiovascular hypertension, Pt. on medications Normal cardiovascular exam Rhythm:Regular Rate:Normal     Neuro/Psych    GI/Hepatic   Endo/Other    Renal/GU   Female GU complaint     Musculoskeletal   Abdominal (+) + obese,   Peds  Hematology   Anesthesia Other Findings   Reproductive/Obstetrics                                                               Anesthesia Evaluation  Patient identified by MRN, date of birth, ID band Patient awake    Reviewed: Allergy & Precautions, NPO status , Patient's Chart, lab work & pertinent test results  Airway Mallampati: II  TM Distance: >3 FB Neck ROM: Full    Dental  (+) Teeth Intact, Dental Advisory Given   Pulmonary neg pulmonary ROS,    breath sounds clear to auscultation       Cardiovascular hypertension, + dysrhythmias Atrial Fibrillation  Rhythm:Regular Rate:Normal     Neuro/Psych Anxiety  Neuromuscular disease    GI/Hepatic negative GI ROS, Neg liver ROS,   Endo/Other  negative endocrine ROS  Renal/GU negative Renal ROS     Musculoskeletal  (+) Arthritis , Osteoarthritis,    Abdominal (+) + obese,   Peds  Hematology negative hematology ROS (+)   Anesthesia Other Findings   Reproductive/Obstetrics                            Lab Results  Component Value Date   WBC 15.8 (H) 02/14/2019   HGB 10.9 (L) 02/14/2019   HCT 35.3 (L) 02/14/2019   MCV 94.1 02/14/2019   PLT 176 02/14/2019   Lab Results  Component Value Date   CREATININE 0.88  02/14/2019   BUN 21 02/14/2019   NA 137 02/14/2019   K 5.4 (H) 02/14/2019   CL 105 02/14/2019   CO2 22 02/14/2019   Lab Results  Component Value Date   INR 0.9 02/13/2019   INR 1.26 04/29/2018   INR 1.2 (H) 01/10/2015   EKG: normal sinus rhythm, 1st degree AV block.  Anesthesia Physical Anesthesia Plan  ASA: III  Anesthesia Plan: General   Post-op Pain Management:    Induction: Intravenous  PONV Risk Score and Plan: 4 or greater and Ondansetron, Dexamethasone and Midazolam  Airway Management Planned: Oral ETT  Additional Equipment: Arterial line  Intra-op Plan:   Post-operative Plan: Extubation in OR  Informed Consent: I have reviewed the patients History and Physical, chart, labs and discussed the procedure including the risks, benefits and alternatives for the proposed anesthesia with the patient or authorized representative who has indicated his/her understanding and acceptance.   Dental advisory given  Plan Discussed with: CRNA  Anesthesia Plan Comments:        Anesthesia Quick Evaluation  Anesthesia Physical  Anesthesia Plan  ASA:  III  Anesthesia Plan: General   Post-op Pain Management:    Induction: Intravenous  PONV Risk Score and Plan:   Airway Management Planned: Oral ETT and LMA  Additional Equipment: None  Intra-op Plan:   Post-operative Plan: Extubation in OR  Informed Consent: I have reviewed the patients History and Physical, chart, labs and discussed the procedure including the risks, benefits and alternatives for the proposed anesthesia with the patient or authorized representative who has indicated his/her understanding and acceptance.     Dental advisory given  Plan Discussed with:   Anesthesia Plan Comments: (See PAT note 02/07/2019, Konrad Felix, PA-C)        Anesthesia Quick Evaluation

## 2019-02-20 NOTE — Anesthesia Procedure Notes (Signed)
Procedure Name: Intubation Date/Time: 02/20/2019 11:36 AM Performed by: West Pugh, CRNA Pre-anesthesia Checklist: Patient identified Patient Re-evaluated:Patient Re-evaluated prior to induction Oxygen Delivery Method: Circle system utilized Preoxygenation: Pre-oxygenation with 100% oxygen Induction Type: IV induction Ventilation: Mask ventilation without difficulty Laryngoscope Size: Mac and 3 Grade View: Grade I Number of attempts: 2 Airway Equipment and Method: Stylet Placement Confirmation: ETT inserted through vocal cords under direct vision,  positive ETCO2,  CO2 detector and breath sounds checked- equal and bilateral Tube secured with: Tape Dental Injury: Teeth and Oropharynx as per pre-operative assessment

## 2019-02-20 NOTE — Interval H&P Note (Signed)
History and Physical Interval Note:  02/20/2019 8:36 AM  [name]  has presented today for surgery, with the diagnosis of LEFT STAGHORN CALCULUS.  The various methods of treatment have been discussed with the patient and family. After consideration of risks, benefits and other options for treatment, the patient has consented to  Procedure(s): NEPHROLITHOTOMY PERCUTANEOUS (Left) as a surgical intervention.  The patient's history has been reviewed, patient examined, no change in status, stable for surgery.  I have reviewed the patient's chart and labs.  Questions were answered to the patient's satisfaction.     Claybon Jabs

## 2019-02-20 NOTE — Transfer of Care (Signed)
Immediate Anesthesia Transfer of Care Note  Patient: [name]  Procedure(s) Performed: NEPHROLITHOTOMY PERCUTANEOUS (Left )  Patient Location: PACU  Anesthesia Type:General  Level of Consciousness: awake, alert , oriented and patient cooperative  Airway & Oxygen Therapy: Patient Spontanous Breathing and Patient connected to face mask  Post-op Assessment: Report given to RN and Patient moving all extremities X 4  Post vital signs: Reviewed and stable  Last Vitals:  Vitals Value Taken Time  BP 161/80 02/20/19 1345  Temp    Pulse 72 02/20/19 1347  Resp 18 02/20/19 1347  SpO2 100 % 02/20/19 1347  Vitals shown include unvalidated device data.  Last Pain:  Vitals:   02/20/19 0908  TempSrc:   PainSc: 5       Patients Stated Pain Goal: 3 (08/65/78 4696)  Complications: No apparent anesthesia complications

## 2019-02-20 NOTE — Anesthesia Preprocedure Evaluation (Signed)
Anesthesia Evaluation  Patient identified by MRN, date of birth, ID band Patient awake    Reviewed: Allergy & Precautions, H&P , NPO status , Patient's Chart, lab work & pertinent test results, reviewed documented beta blocker date and time   Airway Mallampati: II  TM Distance: >3 FB Neck ROM: full    Dental no notable dental hx. (+) Teeth Intact   Pulmonary neg pulmonary ROS, shortness of breath,    Pulmonary exam normal breath sounds clear to auscultation       Cardiovascular Exercise Tolerance: Good hypertension, +CHF  + dysrhythmias Atrial Fibrillation  Rhythm:regular Rate:Normal  EKG: 05/11/18 Rate 83 bpm Normal sinus rhythm Nonspecific ST abnormality Abnormal QRS-T angle, consider primary T wave abnormality Abnormal ECG Since last tracing there is resolution of anterior T wave abnormality  CV: Echo 10/25/16 Study Conclusions  - Left ventricle: The cavity size was normal. There was mild concentric hypertrophy. Systolic function was normal. The estimated ejection fraction was in the range of 55% to 60%. Wall motion was normal; there were no regional wall motion abnormalities. Doppler parameters are consistent with abnormal left ventricular relaxation (grade 1 diastolic dysfunction). - Mitral valve: Valve area by pressure half-time: 1.76 cm^2. - Left atrium: The atrium was mildly to moderately dilated.  Cardiac Cath 01/16/15  Normal coronary arteries  Normal left ventricular systolic function  False positive myocardial perfusion study   Neuro/Psych Anxiety  Neuromuscular disease negative neurological ROS  negative psych ROS   GI/Hepatic negative GI ROS, Neg liver ROS,   Endo/Other  diabetesMorbid obesity  Renal/GU ARFRenal disease  negative genitourinary   Musculoskeletal  (+) Arthritis , Osteoarthritis,    Abdominal   Peds  Hematology  (+) Blood dyscrasia, anemia ,   Anesthesia Other  Findings   Reproductive/Obstetrics negative OB ROS                             Anesthesia Physical Anesthesia Plan  ASA: II  Anesthesia Plan: General   Post-op Pain Management:    Induction:   PONV Risk Score and Plan:   Airway Management Planned:   Additional Equipment:   Intra-op Plan:   Post-operative Plan:   Informed Consent: I have reviewed the patients History and Physical, chart, labs and discussed the procedure including the risks, benefits and alternatives for the proposed anesthesia with the patient or authorized representative who has indicated his/her understanding and acceptance.     Dental Advisory Given  Plan Discussed with: CRNA  Anesthesia Plan Comments:         Anesthesia Quick Evaluation

## 2019-02-20 NOTE — Plan of Care (Signed)
Continue current POC 

## 2019-02-20 NOTE — Anesthesia Postprocedure Evaluation (Signed)
Anesthesia Post Note  Patient: [name]  Procedure(s) Performed: NEPHROLITHOTOMY PERCUTANEOUS (Left )     Patient location during evaluation: PACU Anesthesia Type: General Level of consciousness: awake and alert Pain management: pain level controlled Vital Signs Assessment: post-procedure vital signs reviewed and stable Respiratory status: spontaneous breathing, nonlabored ventilation, respiratory function stable and patient connected to nasal cannula oxygen Cardiovascular status: blood pressure returned to baseline and stable Postop Assessment: no apparent nausea or vomiting Anesthetic complications: no    Last Vitals:  Vitals:   02/20/19 1343 02/20/19 1345  BP: (!) 171/86 (!) 161/80  Pulse: 73 73  Resp: 19 18  Temp: 36.4 C   SpO2: 100% 100%    Last Pain:  Vitals:   02/20/19 1400  TempSrc:   PainSc: 9                  Kaizley Aja

## 2019-02-20 NOTE — Op Note (Addendum)
PATIENT:  [name]  PRE-OPERATIVE DIAGNOSIS: Left renal stone  POST-OPERATIVE DIAGNOSIS: Same  PROCEDURE:  1. Percutaneous nephrostomy sheath placement. 2.  left percutaneous nephrolithotomy (2 cm.) 3.  Left nephroureteral stent placement 4.  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  She underwent nephroureteral stent placement on 8/3 and developed a rash thought to be due to contrast postoperatively.  Jer original PCNL was canceled and she presents today for left PCNL through existing tract.  ANESTHESIA:  General  EBL:  167mls  DRAINS:  6 [place] council tip catheter functioning is nephrostomy tube Kumpe catheter functioning is nephroureteral stent  LOCAL MEDICATIONS USED: 10 cc of quarter percent Marcaine plain  SPECIMEN: Stone for analysis  FINDINGS: -No evidence of contrast reaction after premedications with Benadryl, Pepcid, Decadron given   - The existing left nephroureteral stent was adjacent to an interpolar infundibulum.  It was thought that we could safely dilate this access tract in order to reach her stone.  - Dilation of the previous tract allowed access to the renal pelvis but we are unable to reach the remainder of the stone burden through this access point  -Small intraoperative injury to the left renal pelvis from the rigid nephroscope.  Nephroureteral stent and nephrostomy tube were placed to allow this to heal  -Interpolar stone burden was treated but majority of upper pole stone unable to be accessed and will need to be addressed during additional procedures  Description of procedure:   After informed consent the patient was taken to the operating room and administered general endotracheal anesthesia.  Contrast allergy premedications were given including  Benadryl, Pepcid, Decadron.    Once fully anesthetized the patient had an 19 French Foley catheter placed It was then moved from the stretcher onto the operating room table in a prone position with bony prominences padded and chest pads in place. The flank with exiting nephrostomy catheter was then sterilely prepped and draped in standard fashion. An official timeout was then performed.  Using the existing nephrostomy catheter access I passed a 0.038 inch floppy tipped guidewire down the ureter into the bladder under fluoroscopy.  This was left in place and the nephrostomy catheter was removed.  A transverse incision was made over the guidewire and a peel-away coaxial catheter was then passed over the guidewire and down the ureter under fluoroscopy.  The inner portion of the coaxial system was then removed and a second guidewire was passed through this catheter and down the ureter into the bladder under fluoroscopy.  The coaxial catheter was then removed and one of the guidewires was secured to the drape as a safety guidewire and the second guidewire was used as a working guidewire.  A 8-10 French dual-lumen catheter was then advanced over the wire into the ureter and a small volume antegrade nephrostogram was performed to opacify the collecting system.    The NephroMax nephrostomy dilating balloon was then passed over the working guidewire into the area of the renal pelvis under fluoroscopy.  It was then inflated using dilute contrast under fluoroscopy until the balloon was fully inflated.  I then passed the 28 French nephrostomy access sheath over the balloon into the area of the renal pelvis under fluoroscopy and then deflated the balloon and removed the dilating balloon.  The 56 French rigid nephroscope was then passed under direct vision through the nephrostomy access sheath.  Clotted blood was evacuated and a small area of renal fat was visible from balloon dilation.  We advanced the nephroscope into  the renal pelvis at which time a small injury to the renal pelvis occurred with visualization of perirenal fat.  Nephroscope was withdrawn and angled toward the upper pole where we encountered part of her stone burden.  Stone was dark in color and dense.  Using the lithotrite the stone was treated using ultrasonic settings only to ensure no small stone fragments were lost.  Larger stone fragments were removed with 2 prong grasper.  After treating approximately one third of her original stone burden we are unable to access any further stone.  The renal pelvis was reinspected confirming the presence of a left renal pelvis injury.  The nephroscope was withdrawn and a dual-lumen catheter was then advanced over the wire into the ureter under fluoroscopic guidance.  A second sensor wire was replaced.  We then proceeded with tube placement.  A new Kumpe catheter was advanced over the wire under fluoroscopic guidance down to the level of the bladder.  Distal angling of the Kumpe catheter confirmed placement within the bladder as we do not want to use further contrast.  The 92 French council tip catheter was then advanced over the wire to the level of the left renal pelvis.  5 cc of 50-50 contrast were used to inflate the balloon.  There appeared to be adequate hemostasis with minimal blood from the nephrostomy tube tract or from the draining urine.  10 cc of quarter percent Marcaine plain were used to inject around the sheath site.  Kumpe and nephrostomy tubes were affixed to the skin separately using an 0 silk suture in a figure-of-eight fashion.  Nephrostomy tube was attached to a drainage bag.  Postoperative plan: - Admit the patient overnight for observation  -Continue perioperative Zosyn   - labs if hemodynamically unstable or increasing flank pain   - If patient remains clinically stable we will forego postoperative CT as we know there is residual stone burden.   - difficult case that resulted in  failure to treat the entire stone burden due to access angle and intraoperative renal pelvis injury.  Future procedure options include repeat PCNL through new access tract versus staged ureteroscopy  PATIENT DISPOSITION:  PACU - hemodynamically stable.

## 2019-02-21 ENCOUNTER — Encounter (HOSPITAL_COMMUNITY): Payer: Self-pay | Admitting: Urology

## 2019-02-21 DIAGNOSIS — N200 Calculus of kidney: Secondary | ICD-10-CM | POA: Diagnosis not present

## 2019-02-21 MED ORDER — SULFAMETHOXAZOLE-TRIMETHOPRIM 400-80 MG PO TABS: 1.0000 | ORAL_TABLET | Freq: Every day | ORAL | 0 refills | Status: DC

## 2019-02-21 NOTE — Discharge Summary (Signed)
Physician Discharge Summary      Patient ID: [name] MRN: [id number] DOB/AGE: 12/10/1945 73 y.o.  Admit date: 02/20/2019 Discharge date: 02/21/2019  Admission Diagnoses: LEFT STAGHORN CALCULUS  Discharge Diagnoses:  Active Problems:   Renal calculus, left   Discharged Condition: good  Hospital Course: She underwent elective left PCNL.  She had previously had a nephrostomy tube placed by interventional radiology and they had indicated that the location where the tube was placed would potentially make access of the stones in the lower portion of her upper pole somewhat difficult.  She developed a reaction to the contrast and therefore was admitted for observation overnight and then improved and was discharged.  She then returned to undergo her procedure and received preoperative steroids, Benadryl and an H2 blocker in preparation for her procedure.   When her nephrostogram was performed intraoperatively she had no difficulties with contrast reaction but it revealed that the nephrostomy access was in an infundibulum between the 2 areas where the bulky stones were located.  PCNL was attempted and during the procedure a small opening in the renal pelvis was created.  I was unable to access the stones in the upper portion of the upper pole or in the lower portion of the upper pole and only one small stone could be treated and removed.  I placed a nephroureteral catheter into the bladder to maintain access and then placed a 26 [place] council catheter in the renal pelvis for drainage.  She did well overnight with some slight discomfort in her left flank but otherwise is doing well.  Her nephrostomy tube is draining and the urine is pink-tinged without clots. She indicated to me that she takes oxycodone on a regular basis due to pain in her legs.  This is prescribed by her orthopedist and I asked if she needed any further pain medication she indicated a concern about taking this medication  long-term.  I told her that I agreed with that and my recommendation was to begin decreasing the amount of oxycodone she takes daily and substituting that with either Tylenol or ibuprofen.  She said she would like to try that. She is felt ready for discharge at this time.  She is tolerating a diet, has no fever and her tube is draining.  She will be discharged with her nephrostomy tube to open drainage and will then follow-up with me as an outpatient in 3 weeks for nephrostogram.  She will be pretreated with prednisone and Benadryl prior to that study as well.  I will maintain her on antibiotics postoperatively.  Discharge Exam: Blood pressure (!) 146/67, pulse (!) 52, temperature 97.8 F (36.6 C), temperature source Oral, resp. rate 15, height 5\' 2"  (1.575 m), weight 103.1 kg, SpO2 100 %. General: Awake, alert and in no apparent distress. Chest: Normal respiratory effort. Cardiovascular: Regular rate and rhythm. Abdomen: Soft, nontender, nondistended. Back: Her nephrostomy tube site is clean and dry with a dressing in place and no ecchymoses.   Disposition: Discharge disposition: 01-Home or Self Care       Discharge Instructions    Discharge patient   Complete by: As directed    Discharge disposition: 01-Home or Self Care   Discharge patient date: 02/21/2019   Nursing communication   Complete by: As directed    Please discharge patient with her nephrostomy tube connected to a drainage bag.     Allergies as of 02/21/2019      Reactions   Contrast Media [iodinated Diagnostic  Agents] Hives, Rash   Omnipaque. Rash, itching   Doxycycline Nausea And Vomiting   Ancef [cefazolin] Rash   Rash to ancef vs omniopaue contrast   Tape Swelling   Adhesive Tape      Medication List    STOP taking these medications   sulfamethoxazole-trimethoprim 800-160 MG tablet Commonly known as: BACTRIM DS     TAKE these medications   acetaminophen 650 MG CR tablet Commonly known as: TYLENOL  Take 1,300 mg by mouth every 8 (eight) hours as needed for pain.   apixaban 5 MG Tabs tablet Commonly known as: Eliquis Take 1 tablet (5 mg total) by mouth 2 (two) times daily.   diclofenac sodium 1 % Gel Commonly known as: VOLTAREN Apply 2 g topically 3 (three) times daily as needed (back pain).   diltiazem 120 MG 24 hr capsule Commonly known as: Cartia XT Take 1 capsule (120 mg total) by mouth daily.   gabapentin 300 MG capsule Commonly known as: NEURONTIN Take 1 capsule (300 mg total) by mouth 3 (three) times daily.   montelukast 10 MG tablet Commonly known as: SINGULAIR Take 1 tablet (10 mg total) by mouth at bedtime.   Oxycodone HCl 10 MG Tabs One Tablet every 4- 6 hours as needed for Pain What changed:   how much to take  how to take this  when to take this  reasons to take this  additional instructions      Follow-up Information    Kathie Rhodes, MD In 3 weeks.   Specialty: Urology Contact information: [address] [place] [address] [phone]           Signed: Claybon Jabs 02/21/2019, 7:12 AM

## 2019-02-21 NOTE — Progress Notes (Signed)
Patient instructed and educated on changing drainage bags for nephrostomy.  Emphasized importance of handwashing before and after handling catheter, and keeping drainage bags clean to prevent infection.  Husband unable to come to floor due physical ability, but patient states that she thinks they can manage it.  Patient verbalizes understanding of instructions.  New bags provided for day/night time use.  Patient also voided 200 cc after foley removal without difficulty. Urine amber and clear.

## 2019-02-21 NOTE — Progress Notes (Signed)
Patient discharged home.  IV removed - WNL. Reviewed AVS and medications.  Dr. Karsten Ro called regarding eliquis. Was not correct on AVS - gave verbal instruction to hold for 48 hr and then resume. If bleeding noted afterwards, stop and call MD. Patient given updated instruction and reminder written on AVS for patient.  Office to call for follow up. Encouraged fluid intake. Briefly reviewed instructions regarding nephrostomy care again. No questions at this time.  Patient assisted off unit in NAD via WC.

## 2019-03-21 DIAGNOSIS — R8279 Other abnormal findings on microbiological examination of urine: Secondary | ICD-10-CM | POA: Diagnosis not present

## 2019-03-21 DIAGNOSIS — N200 Calculus of kidney: Secondary | ICD-10-CM | POA: Diagnosis not present

## 2019-03-24 ENCOUNTER — Other Ambulatory Visit: Payer: Self-pay | Admitting: Urology

## 2019-03-24 ENCOUNTER — Telehealth: Payer: Self-pay | Admitting: Interventional Cardiology

## 2019-03-24 DIAGNOSIS — R1084 Generalized abdominal pain: Secondary | ICD-10-CM | POA: Diagnosis not present

## 2019-03-24 DIAGNOSIS — R8271 Bacteriuria: Secondary | ICD-10-CM | POA: Diagnosis not present

## 2019-03-24 NOTE — Telephone Encounter (Signed)
   Primary Cardiologist: [name], MD  Chart reviewed as part of pre-operative protocol coverage. Per pharmacy recommendations, patient can hold eliquis 2 days prior to his upcoming ureteroscopy. Eliquis should be restarted as soon as he is cleared to do so by Dr. Karsten Ro.  I will route this recommendation to the requesting party via Epic fax function and remove from pre-op pool.  Please call with questions.  Abigail Butts, PA-C 03/24/2019, 2:16 PM

## 2019-03-24 NOTE — Telephone Encounter (Signed)
Patient with diagnosis of afib on Eliquis for anticoagulation.    Procedure: Ureteroscopy Date of procedure: 04/17/2019  CHADS2-VASc score of  4 (CHF, HTN, AGE, female)  CrCl 47 ml/min  Per office protocol, patient can hold Eliquis for 2 days prior to procedure.

## 2019-03-24 NOTE — Telephone Encounter (Signed)
New message      [facility] Pre-operative Risk Assessment    Request for surgical clearance:  1. What type of surgery is being performed? Ureteroscopy  2. When is this surgery scheduled? 04/17/2019  3. What type of clearance is required (medical clearance vs. Pharmacy clearance to hold med vs. Both)? paharmacy  4. Are there any medications that need to be held prior to surgery and how long?Eliquis , 2 daysl  5. Practice name and name of physician performing surgery?[facility], Dr. Kathie Rhodes  6. What is your office phone number [phone] ext [phone]   7.   What is your office fax number [phone]  8.   Anesthesia type (None, local, MAC, general) ? general   [name] 03/24/2019, 11:22 AM  _________________________________________________________________   (provider comments below)

## 2019-03-28 ENCOUNTER — Other Ambulatory Visit: Payer: Self-pay | Admitting: Urology

## 2019-03-28 DIAGNOSIS — I480 Paroxysmal atrial fibrillation: Secondary | ICD-10-CM | POA: Diagnosis not present

## 2019-03-28 DIAGNOSIS — D649 Anemia, unspecified: Secondary | ICD-10-CM | POA: Diagnosis not present

## 2019-03-28 DIAGNOSIS — E118 Type 2 diabetes mellitus with unspecified complications: Secondary | ICD-10-CM | POA: Diagnosis not present

## 2019-03-28 DIAGNOSIS — E876 Hypokalemia: Secondary | ICD-10-CM | POA: Diagnosis not present

## 2019-03-28 DIAGNOSIS — N200 Calculus of kidney: Secondary | ICD-10-CM | POA: Diagnosis not present

## 2019-04-06 DIAGNOSIS — H04123 Dry eye syndrome of bilateral lacrimal glands: Secondary | ICD-10-CM | POA: Diagnosis not present

## 2019-04-11 NOTE — Patient Instructions (Addendum)
DUE TO COVID-19 ONLY ONE VISITOR IS ALLOWED TO COME WITH YOU AND STAY IN THE WAITING ROOM ONLY DURING PRE OP AND PROCEDURE DAY OF SURGERY. THE 1 VISITOR MAY VISIT WITH YOU AFTER SURGERY IN YOUR PRIVATE ROOM DURING VISITING HOURS ONLY!  YOU NEED TO HAVE A COVID 19 TEST ON_______ @_______ , THIS TEST MUST BE DONE BEFORE SURGERY, COME  [place], [place] , [address].  ([place]) ONCE YOUR COVID TEST IS COMPLETED, PLEASE BEGIN THE QUARANTINE INSTRUCTIONS AS OUTLINED IN YOUR HANDOUT.                [name]  04/11/2019   Your procedure is scheduled on: 04-17-19   Report to [facility] Main  Entrance   Report to short stayat        0530  AM     Call this number if you have problems the morning of surgery [phone]    Remember: Do not eat food or drink liquids :After Midnight.  BRUSH YOUR TEETH MORNING OF SURGERY AND RINSE YOUR MOUTH OUT, NO CHEWING GUM CANDY OR MINTS.     Take these medicines the morning of surgery with A SIP OF WATER: oxycodone if needed, diltiazem                                 You may not have any metal on your body including hair pins and              piercings  Do not wear jewelry, make-up, lotions, powders or perfumes, deodorant             Do not wear nail polish on your fingernails.  Do not shave  48 hours prior to surgery.              Do not bring valuables to the hospital. [place].  Contacts, dentures or bridgework may not be worn into surgery.      Patients discharged the day of surgery will not be allowed to drive home. IF YOU ARE HAVING SURGERY AND GOING HOME THE SAME DAY, YOU MUST HAVE AN ADULT TO DRIVE YOU HOME AND BE WITH YOU FOR 24 HOURS. YOU MAY GO HOME BY TAXI OR UBER OR ORTHERWISE, BUT AN ADULT MUST ACCOMPANY YOU HOME AND STAY WITH YOU FOR 24 HOURS.  Name and phone number of your driver:  Special Instructions: N/A              Please read over the  following fact sheets you were given: _____________________________________________________________________             [facility] - Preparing for Surgery Before surgery, you can play an important role.  Because skin is not sterile, your skin needs to be as free of germs as possible.  You can reduce the number of germs on your skin by washing with CHG (chlorahexidine gluconate) soap before surgery.  CHG is an antiseptic cleaner which kills germs and bonds with the skin to continue killing germs even after washing. Please DO NOT use if you have an allergy to CHG or antibacterial soaps.  If your skin becomes reddened/irritated stop using the CHG and inform your nurse when you arrive at Short Stay. Do not shave (including legs and underarms) for at least 48  hours prior to the first CHG shower.  You may shave your face/neck. Please follow these instructions carefully:  1.  Shower with CHG Soap the night before surgery and the  morning of Surgery.  2.  If you choose to wash your hair, wash your hair first as usual with your  normal  shampoo.  3.  After you shampoo, rinse your hair and body thoroughly to remove the  shampoo.                           4.  Use CHG as you would any other liquid soap.  You can apply chg directly  to the skin and wash                       Gently with a scrungie or clean washcloth.  5.  Apply the CHG Soap to your body ONLY FROM THE NECK DOWN.   Do not use on face/ open                           Wound or open sores. Avoid contact with eyes, ears mouth and genitals (private parts).                       Wash face,  Genitals (private parts) with your normal soap.             6.  Wash thoroughly, paying special attention to the area where your surgery  will be performed.  7.  Thoroughly rinse your body with warm water from the neck down.  8.  DO NOT shower/wash with your normal soap after using and rinsing off  the CHG Soap.                9.  Pat yourself dry with a clean  towel.            10.  Wear clean pajamas.            11.  Place clean sheets on your bed the night of your first shower and do not  sleep with pets. Day of Surgery : Do not apply any lotions/deodorants the morning of surgery.  Please wear clean clothes to the hospital/surgery center.  FAILURE TO FOLLOW THESE INSTRUCTIONS MAY RESULT IN THE CANCELLATION OF YOUR SURGERY PATIENT SIGNATURE_________________________________  NURSE SIGNATURE__________________________________  ________________________________________________________________________

## 2019-04-13 ENCOUNTER — Other Ambulatory Visit (HOSPITAL_COMMUNITY)
Admission: RE | Admit: 2019-04-13 | Discharge: 2019-04-13 | Disposition: A | Discharge status: Home / Self Care | Payer: PPO | Source: Ambulatory Visit | Attending: Urology | Admitting: Urology

## 2019-04-13 DIAGNOSIS — Z01812 Encounter for preprocedural laboratory examination: Secondary | ICD-10-CM | POA: Diagnosis not present

## 2019-04-13 DIAGNOSIS — Z20828 Contact with and (suspected) exposure to other viral communicable diseases: Secondary | ICD-10-CM | POA: Diagnosis not present

## 2019-04-14 ENCOUNTER — Other Ambulatory Visit: Payer: Self-pay

## 2019-04-14 ENCOUNTER — Encounter (HOSPITAL_COMMUNITY)
Admission: RE | Admit: 2019-04-14 | Discharge: 2019-04-14 | Disposition: A | Discharge status: Home / Self Care | Payer: PPO | Source: Ambulatory Visit | Attending: Urology | Admitting: Urology

## 2019-04-14 ENCOUNTER — Encounter (HOSPITAL_COMMUNITY): Payer: Self-pay

## 2019-04-14 DIAGNOSIS — M48061 Spinal stenosis, lumbar region without neurogenic claudication: Secondary | ICD-10-CM | POA: Diagnosis not present

## 2019-04-14 DIAGNOSIS — M13861 Other specified arthritis, right knee: Secondary | ICD-10-CM | POA: Diagnosis not present

## 2019-04-14 DIAGNOSIS — Z79899 Other long term (current) drug therapy: Secondary | ICD-10-CM | POA: Diagnosis not present

## 2019-04-14 DIAGNOSIS — Z7901 Long term (current) use of anticoagulants: Secondary | ICD-10-CM | POA: Insufficient documentation

## 2019-04-14 DIAGNOSIS — M5137 Other intervertebral disc degeneration, lumbosacral region: Secondary | ICD-10-CM | POA: Insufficient documentation

## 2019-04-14 DIAGNOSIS — N200 Calculus of kidney: Secondary | ICD-10-CM | POA: Insufficient documentation

## 2019-04-14 DIAGNOSIS — Z01812 Encounter for preprocedural laboratory examination: Secondary | ICD-10-CM | POA: Diagnosis not present

## 2019-04-14 DIAGNOSIS — M13862 Other specified arthritis, left knee: Secondary | ICD-10-CM | POA: Diagnosis not present

## 2019-04-14 DIAGNOSIS — Z20828 Contact with and (suspected) exposure to other viral communicable diseases: Secondary | ICD-10-CM | POA: Insufficient documentation

## 2019-04-14 DIAGNOSIS — R7303 Prediabetes: Secondary | ICD-10-CM | POA: Diagnosis not present

## 2019-04-14 DIAGNOSIS — R0600 Dyspnea, unspecified: Secondary | ICD-10-CM | POA: Insufficient documentation

## 2019-04-14 DIAGNOSIS — I5032 Chronic diastolic (congestive) heart failure: Secondary | ICD-10-CM | POA: Diagnosis not present

## 2019-04-14 DIAGNOSIS — R319 Hematuria, unspecified: Secondary | ICD-10-CM | POA: Insufficient documentation

## 2019-04-14 LAB — BASIC METABOLIC PANEL
Anion gap: 8 (ref 5–15)
BUN: 19 mg/dL (ref 8–23)
CO2: 26 mmol/L (ref 22–32)
Calcium: 8.8 mg/dL — ABNORMAL LOW (ref 8.9–10.3)
Chloride: 105 mmol/L (ref 98–111)
Creatinine, Ser: 0.9 mg/dL (ref 0.44–1.00)
GFR calc Af Amer: >60 mL/min (ref >60)
GFR calc non Af Amer: >60 mL/min (ref >60)
Glucose, Bld: 117 mg/dL — ABNORMAL HIGH (ref 70–99)
Potassium: 4 mmol/L (ref 3.5–5.1)
Sodium: 139 mmol/L (ref 135–145)

## 2019-04-14 LAB — CBC
HCT: 36 % (ref 36.0–46.0)
Hemoglobin: 11.1 g/dL — ABNORMAL LOW (ref 12.0–15.0)
MCH: 28.5 pg (ref 26.0–34.0)
MCHC: 30.8 g/dL (ref 30.0–36.0)
MCV: 92.3 fL (ref 80.0–100.0)
Platelets: 334 10*3/uL (ref 150–400)
RBC: 3.9 MIL/uL (ref 3.87–5.11)
RDW: 13.4 % (ref 11.5–15.5)
WBC: 7.3 10*3/uL (ref 4.0–10.5)
nRBC: 0 % (ref 0.0–0.2)

## 2019-04-14 LAB — HEMOGLOBIN A1C
Hgb A1c MFr Bld: 5.7 % — ABNORMAL HIGH (ref 4.8–5.6)
Mean Plasma Glucose: 116.89 mg/dL

## 2019-04-14 LAB — NOVEL CORONAVIRUS, NAA (HOSP ORDER, SEND-OUT TO REF LAB; TAT 18-24 HRS): SARS-CoV-2, NAA: NOT DETECTED

## 2019-04-14 NOTE — Anesthesia Preprocedure Evaluation (Addendum)
Anesthesia Evaluation  Patient identified by MRN, date of birth, ID band Patient awake    Reviewed: Allergy & Precautions, NPO status , Patient's Chart, lab work & pertinent test results  History of Anesthesia Complications Negative for: history of anesthetic complications  Airway Mallampati: II  TM Distance: >3 FB Neck ROM: Full    Dental  (+) Dental Advisory Given   Pulmonary COPD,  COPD inhaler,  04/13/2019 SARS coronavirus NEG   breath sounds clear to auscultation       Cardiovascular hypertension, Pt. on medications (-) angina+ dysrhythmias Atrial Fibrillation  Rhythm:Regular Rate:Normal  '18 ECHO: EF 55-60%, valves OK '16 cath: normal coronaries, normal LVF   Neuro/Psych Anxiety DDD: narcotics    GI/Hepatic Neg liver ROS, Pt nauseated   Endo/Other  Morbid obesity  Renal/GU stones     Musculoskeletal  (+) Arthritis ,   Abdominal (+) + obese,   Peds  Hematology eliquis   Anesthesia Other Findings   Reproductive/Obstetrics                            Anesthesia Physical Anesthesia Plan  ASA: III  Anesthesia Plan: General   Post-op Pain Management:    Induction: Intravenous  PONV Risk Score and Plan: 3 and Ondansetron, Dexamethasone and Treatment may vary due to age or medical condition  Airway Management Planned: Oral ETT  Additional Equipment:   Intra-op Plan:   Post-operative Plan: Extubation in OR  Informed Consent: I have reviewed the patients History and Physical, chart, labs and discussed the procedure including the risks, benefits and alternatives for the proposed anesthesia with the patient or authorized representative who has indicated his/her understanding and acceptance.     Dental advisory given  Plan Discussed with: CRNA and Surgeon  Anesthesia Plan Comments: (See PAT note 04/14/2019, Konrad Felix, PA-C)      Anesthesia Quick Evaluation

## 2019-04-14 NOTE — Progress Notes (Signed)
Anesthesia Chart Review   Case: [id number] Date/Time: 04/17/19 0715   Procedure: CYSTOSCOPY WITH LEFT  URETEROSCOPY HOLMIUM LASER AND STENT PLACEMENT (Left )   Anesthesia type: General   Pre-op diagnosis: LEFT RENAL STONE   Location: [place] [facility]   Surgeon: Kathie Rhodes, MD      DISCUSSION:73 y.o. never smoker with h/o A-fib (on Eliquis), chronic diastolic heart failure, left renal stone scheduled for above procedure 04/17/2019 with Dr. Kathie Rhodes.    Per Para Skeans, PA-C with cardiology, "Chart reviewed as part of pre-operative protocol coverage. Per pharmacy recommendations, patient can hold eliquis 2 days prior to his upcoming ureteroscopy. Eliquis should be restarted as soon as he is cleared to do so by Dr. Karsten Ro"  S/p percutaneous nephrolithotomy 02/20/2019 with no anesthesia complications noted.   Anticipate pt can proceed with planned procedure barring acute status change.   VS: BP (!) 165/66 (BP Location: Left Arm)   Pulse 98   Temp 37 C (Oral)   Resp 18   Ht 5\' 2"  (1.575 m)   Wt 103.5 kg   SpO2 100%   BMI 41.72 kg/m   PROVIDERS: Jani Gravel, MD is PCP   Daneen Schick, MD is Cardiologist  LABS: Labs reviewed: Acceptable for surgery. (all labs ordered are listed, but only abnormal results are displayed)  Labs Reviewed  HEMOGLOBIN A1C - Abnormal; Notable for the following components:      Result Value   Hgb A1c MFr Bld 5.7 (*)    All other components within normal limits  BASIC METABOLIC PANEL - Abnormal; Notable for the following components:   Glucose, Bld 117 (*)    Calcium 8.8 (*)    All other components within normal limits  CBC - Abnormal; Notable for the following components:   Hemoglobin 11.1 (*)    All other components within normal limits     IMAGES:   EKG: 05/11/2018 Rate 83 bpm Normal sinus rhythm Nonspecific ST abnormality Abnormal QRS-T angle, consider primary T wave abnormality Abnormal ECG Since last tracing there is  resolution of anterior T wave abnormality  CV: Echo 10/25/2016 Study Conclusions  - Left ventricle: The cavity size was normal. There was mild   concentric hypertrophy. Systolic function was normal. The   estimated ejection fraction was in the range of 55% to 60%. Wall   motion was normal; there were no regional wall motion   abnormalities. Doppler parameters are consistent with abnormal   left ventricular relaxation (grade 1 diastolic dysfunction). - Mitral valve: Valve area by pressure half-time: 1.76 cm^2. - Left atrium: The atrium was mildly to moderately dilated.  Cardiac Cath 01/16/2015  Normal coronary arteries  Normal left ventricular systolic function  False positive myocardial perfusion study  RECOMMENDATIONS:  No further ischemic evaluation is necessary  Myocardial Perfusion Imaging 01/09/2015  Nuclear stress EF: 76%.  There was no ST segment deviation noted during stress.  Defect 1: There is a defect present in the basal anterior, basal anteroseptal, basal inferolateral, basal anterolateral, mid anterior, mid anteroseptal, mid inferoseptal, mid inferolateral, mid anterolateral, apical anterior, apical septal, apical lateral and apex location.  Findings consistent with ischemia.  This is a high risk study.  The left ventricular ejection fraction is hyperdynamic (>65%).   This is a high risk study. There is a large size, moderate severity reversible perfusion defect in the entire anterior, anteroseptal, anterolateral, inferolateral and mid inferoseptal walls suspicious for left main disease (SDS 12).  A cardiac cath is  recommended.  Past Medical History:  Diagnosis Date  . Acute hypoxemic respiratory failure ([place]) 10/24/2016  . Arthritis    both knees  . Atrial fibrillation, chronic ([place])   . Carpal tunnel syndrome, bilateral    both hands go to sleep at times from elbows down  . Chronic diastolic heart failure ([place])   . DDD (degenerative disc disease),  lumbosacral   . Dyspnea    during chronic bronchitis, resolved at this time  . First degree AV block   . Hematuria 11/15/2017  . History of kidney stones   . History of rib fracture 03/2011  . Lumbar stenosis   . Obesity   . Postlaminectomy syndrome   . Pre-diabetes   . Renal calculi    BILATERAL  . Right ureteral stone   . Rotator cuff tear, left   . Rotator cuff tear, right   . UTI (urinary tract infection)     Past Surgical History:  Procedure Laterality Date  . ANTERIOR CERVICAL DECOMP/DISCECTOMY FUSION  03-18-2011   C3 -- C7  . ANTERIOR LATERAL LUMBAR FUSION 4 LEVELS N/A 05/06/2018   Procedure: Lumbar one- two Lumbar two -three Lumbar  three-four Lumbar four-five Anterolateral decompression/fusion/posterior bilateral laminectomy Lumbar four-five Lumbar five Sacral one/posterior percutaneous fixation/mazor;  Surgeon: Kristeen Miss, MD;  Location: [facility];  Service: Neurosurgery;  Laterality: N/A;  . APPENDECTOMY    . APPLICATION OF ROBOTIC ASSISTANCE FOR SPINAL PROCEDURE N/A 05/06/2018   Procedure: APPLICATION OF ROBOTIC ASSISTANCE FOR SPINAL PROCEDURE;  Surgeon: Kristeen Miss, MD;  Location: [facility];  Service: Neurosurgery;  Laterality: N/A;  . BILATERAL CARPAL TUNNEL RELEASE     3 screws in right hand, thumb, middle finger, and index finger,   . CARDIAC CATHETERIZATION N/A 01/16/2015   Procedure: Left Heart Cath and Coronary Angiography;  Surgeon: Belva Crome, MD;  Location: [place] [facility];  Service: Cardiovascular;  Laterality: N/A;  . CHOLECYSTECTOMY OPEN  1970'S  . COLONOSCOPY  2018  . CYSTOSCOPY WITH RETROGRADE PYELOGRAM, URETEROSCOPY AND STENT PLACEMENT N/A 02/13/2013   Procedure: CYSTOSCOPY WITH RETROGRADE PYELOGRAM, URETEROSCOPY AND LITHOTRIPSY ;  Surgeon: Claybon Jabs, MD;  Location: [facility];  Service: Urology;  Laterality: N/A;  . CYSTOSCOPY/URETEROSCOPY/HOLMIUM LASER/STENT PLACEMENT Right 11/22/2017   Procedure: CYSTOSCOPY/URETEROSCOPY/RETROGRADE  PYELOGRAM/HOLMIUM LASER/STENT PLACEMENT;  Surgeon: Kathie Rhodes, MD;  Location: [facility];  Service: Urology;  Laterality: Right;  . EXTRACORPOREAL SHOCK WAVE LITHOTRIPSY Right 05-16-2012  . FINGER ARTHROSCOPY WITH CARPOMETACARPEL ([facility]) ARTHROPLASTY    . HOLMIUM LASER APPLICATION Right [id number]   Procedure: HOLMIUM LASER APPLICATION;  Surgeon: Claybon Jabs, MD;  Location: [facility];  Service: Urology;  Laterality: Right;  . IR URETERAL STENT LEFT NEW ACCESS W/O SEP NEPHROSTOMY CATH  02/13/2019  . KNEE ARTHROSCOPY Bilateral   . LUMBAR LAMINECTOMY  X2  1960'S  . LUMBAR PERCUTANEOUS PEDICLE SCREW 4 LEVEL N/A 05/06/2018   Procedure: LUMBAR PERCUTANEOUS PEDICLE SCREW LUMBAR ONE TO LUMBAR FIVE WITH MAZOR ;POSTERIOR LATERAL LAMINECTOMY LUMBAR FOUR-FIVE,FIVE-SACRAL ONE;  Surgeon: Kristeen Miss, MD;  Location: [place];  Service: Neurosurgery;  Laterality: N/A;  . NASAL SEPTUM SURGERY  1970'S  . NEPHROLITHOTOMY Left 02/20/2019   Procedure: NEPHROLITHOTOMY PERCUTANEOUS;  Surgeon: Kathie Rhodes, MD;  Location: [facility];  Service: Urology;  Laterality: Left;  . SHOULDER OPEN ROTATOR CUFF REPAIR Bilateral LEFT 01-11-2001/   RIGHT 07-21-2002  . TOTAL KNEE ARTHROPLASTY  04/13/2012   Procedure: TOTAL KNEE ARTHROPLASTY;  Surgeon: Gearlean Alf, MD;  Location: [facility];  Service: Orthopedics;  Laterality: Left;  . TOTAL KNEE ARTHROPLASTY  08/08/2012   Procedure: TOTAL KNEE ARTHROPLASTY;  Surgeon: Gearlean Alf, MD;  Location: [facility];  Service: Orthopedics;  Laterality: Right;  . TRANSTHORACIC ECHOCARDIOGRAM  03-29-2011   MODERATE LVH/ EF 60-65%/ MILD MR  . VAGINAL HYSTERECTOMY  1960'S    MEDICATIONS: . acetaminophen (TYLENOL) 650 MG CR tablet  . apixaban (ELIQUIS) 5 MG TABS tablet  . diclofenac sodium (VOLTAREN) 1 % GEL  . diltiazem (CARTIA XT) 120 MG 24 hr capsule  . gabapentin (NEURONTIN) 300 MG capsule  . montelukast (SINGULAIR) 10 MG tablet  . Oxycodone HCl 10 MG TABS  .  Polyethyl Glycol-Propyl Glycol (SYSTANE) 0.4-0.3 % SOLN  . sulfamethoxazole-trimethoprim (BACTRIM) 400-80 MG tablet   No current facility-administered medications for this encounter.     Maia Plan [facility] Pre-Surgical Testing [phone] 04/14/19  2:08 PM

## 2019-04-17 ENCOUNTER — Encounter (HOSPITAL_COMMUNITY): Payer: Self-pay

## 2019-04-17 ENCOUNTER — Ambulatory Visit (HOSPITAL_COMMUNITY)
Admission: RE | Admit: 2019-04-17 | Discharge: 2019-04-17 | Disposition: A | Discharge status: Home / Self Care | Payer: PPO | Attending: Urology | Admitting: Urology

## 2019-04-17 ENCOUNTER — Ambulatory Visit (HOSPITAL_COMMUNITY): Payer: PPO

## 2019-04-17 ENCOUNTER — Encounter (HOSPITAL_COMMUNITY)
Admission: RE | Disposition: A | Discharge status: Home / Self Care | Payer: Self-pay | Source: Home / Self Care | Attending: Urology

## 2019-04-17 ENCOUNTER — Ambulatory Visit (HOSPITAL_COMMUNITY): Payer: PPO | Admitting: Anesthesiology

## 2019-04-17 ENCOUNTER — Ambulatory Visit (HOSPITAL_COMMUNITY): Payer: PPO | Admitting: Physician Assistant

## 2019-04-17 DIAGNOSIS — Z7901 Long term (current) use of anticoagulants: Secondary | ICD-10-CM | POA: Diagnosis not present

## 2019-04-17 DIAGNOSIS — I480 Paroxysmal atrial fibrillation: Secondary | ICD-10-CM | POA: Diagnosis not present

## 2019-04-17 DIAGNOSIS — Z6841 Body Mass Index (BMI) 40.0 and over, adult: Secondary | ICD-10-CM | POA: Diagnosis not present

## 2019-04-17 DIAGNOSIS — N200 Calculus of kidney: Secondary | ICD-10-CM

## 2019-04-17 DIAGNOSIS — F419 Anxiety disorder, unspecified: Secondary | ICD-10-CM | POA: Insufficient documentation

## 2019-04-17 DIAGNOSIS — M1990 Unspecified osteoarthritis, unspecified site: Secondary | ICD-10-CM | POA: Diagnosis not present

## 2019-04-17 DIAGNOSIS — I10 Essential (primary) hypertension: Secondary | ICD-10-CM | POA: Insufficient documentation

## 2019-04-17 DIAGNOSIS — I110 Hypertensive heart disease with heart failure: Secondary | ICD-10-CM | POA: Diagnosis not present

## 2019-04-17 DIAGNOSIS — Z79899 Other long term (current) drug therapy: Secondary | ICD-10-CM | POA: Insufficient documentation

## 2019-04-17 DIAGNOSIS — I5032 Chronic diastolic (congestive) heart failure: Secondary | ICD-10-CM | POA: Diagnosis not present

## 2019-04-17 DIAGNOSIS — J449 Chronic obstructive pulmonary disease, unspecified: Secondary | ICD-10-CM | POA: Insufficient documentation

## 2019-04-17 DIAGNOSIS — N132 Hydronephrosis with renal and ureteral calculous obstruction: Secondary | ICD-10-CM | POA: Diagnosis not present

## 2019-04-17 HISTORY — PX: CYSTOSCOPY WITH URETEROSCOPY AND STENT PLACEMENT: SHX6377

## 2019-04-17 SURGERY — CYSTOURETEROSCOPY, WITH STENT INSERTION
Anesthesia: General | Laterality: Left

## 2019-04-17 MED ORDER — DEXAMETHASONE SODIUM PHOSPHATE 4 MG/ML IJ SOLN
INTRAMUSCULAR | Status: DC | PRN
  Administered 2019-04-17: 4 mg via INTRAVENOUS

## 2019-04-17 MED ORDER — PROMETHAZINE HCL 25 MG/ML IJ SOLN: 6.2500 mg | INTRAMUSCULAR | Status: DC | PRN

## 2019-04-17 MED ORDER — IOHEXOL 300 MG/ML  SOLN
INTRAMUSCULAR | Status: DC | PRN
  Administered 2019-04-17: 09:00:00 22 mL via URETHRAL

## 2019-04-17 MED ORDER — SUCCINYLCHOLINE CHLORIDE 200 MG/10ML IV SOSY
PREFILLED_SYRINGE | INTRAVENOUS | Status: AC
  Filled 2019-04-17: qty 10

## 2019-04-17 MED ORDER — PROPOFOL 10 MG/ML IV BOLUS
INTRAVENOUS | Status: AC
  Filled 2019-04-17: qty 40

## 2019-04-17 MED ORDER — OXYCODONE HCL 5 MG PO TABS
ORAL_TABLET | ORAL | Status: AC
  Filled 2019-04-17: qty 2

## 2019-04-17 MED ORDER — SUCCINYLCHOLINE CHLORIDE 20 MG/ML IJ SOLN
INTRAMUSCULAR | Status: DC | PRN
  Administered 2019-04-17: 100 mg via INTRAVENOUS

## 2019-04-17 MED ORDER — OXYCODONE HCL 5 MG PO TABS
10.0000 mg | ORAL_TABLET | Freq: Once | ORAL | Status: AC
  Administered 2019-04-17: 10 mg via ORAL

## 2019-04-17 MED ORDER — MEPERIDINE HCL 50 MG/ML IJ SOLN: 6.2500 mg | INTRAMUSCULAR | Status: DC | PRN

## 2019-04-17 MED ORDER — 0.9 % SODIUM CHLORIDE (POUR BTL) OPTIME
TOPICAL | Status: DC | PRN
  Administered 2019-04-17: 08:00:00 1000 mL

## 2019-04-17 MED ORDER — HYDROMORPHONE HCL 1 MG/ML IJ SOLN: 0.2500 mg | INTRAMUSCULAR | Status: DC | PRN

## 2019-04-17 MED ORDER — ONDANSETRON HCL 4 MG/2ML IJ SOLN
INTRAMUSCULAR | Status: DC | PRN
  Administered 2019-04-17: 4 mg via INTRAVENOUS

## 2019-04-17 MED ORDER — PROPOFOL 10 MG/ML IV BOLUS
INTRAVENOUS | Status: DC | PRN
  Administered 2019-04-17: 150 mg via INTRAVENOUS

## 2019-04-17 MED ORDER — LACTATED RINGERS IV SOLN
INTRAVENOUS | Status: DC
  Administered 2019-04-17 (×2): via INTRAVENOUS

## 2019-04-17 MED ORDER — LIDOCAINE 2% (20 MG/ML) 5 ML SYRINGE
INTRAMUSCULAR | Status: AC
  Filled 2019-04-17: qty 10

## 2019-04-17 MED ORDER — FENTANYL CITRATE (PF) 100 MCG/2ML IJ SOLN
INTRAMUSCULAR | Status: AC
  Filled 2019-04-17: qty 2

## 2019-04-17 MED ORDER — LIDOCAINE HCL (CARDIAC) PF 100 MG/5ML IV SOSY
PREFILLED_SYRINGE | INTRAVENOUS | Status: DC | PRN
  Administered 2019-04-17: 100 mg via INTRAVENOUS

## 2019-04-17 MED ORDER — SODIUM CHLORIDE 0.9 % IR SOLN
Status: DC | PRN
  Administered 2019-04-17: 9000 mL via INTRAVESICAL

## 2019-04-17 MED ORDER — ROCURONIUM BROMIDE 100 MG/10ML IV SOLN
INTRAVENOUS | Status: DC | PRN
  Administered 2019-04-17: 40 mg via INTRAVENOUS
  Administered 2019-04-17 (×2): 10 mg via INTRAVENOUS

## 2019-04-17 MED ORDER — ROCURONIUM BROMIDE 10 MG/ML (PF) SYRINGE
PREFILLED_SYRINGE | INTRAVENOUS | Status: AC
  Filled 2019-04-17: qty 20

## 2019-04-17 MED ORDER — FENTANYL CITRATE (PF) 100 MCG/2ML IJ SOLN
INTRAMUSCULAR | Status: DC | PRN
  Administered 2019-04-17 (×3): 50 ug via INTRAVENOUS

## 2019-04-17 MED ORDER — OXYCODONE HCL 10 MG PO TABS: 10.0000 mg | ORAL_TABLET | ORAL | 0 refills | Status: DC | PRN

## 2019-04-17 MED ORDER — SODIUM CHLORIDE 0.9 % IV SOLN
2.0000 g | INTRAVENOUS | Status: AC
  Administered 2019-04-17: 2 g via INTRAVENOUS
  Filled 2019-04-17: qty 20

## 2019-04-17 MED ORDER — MIDAZOLAM HCL 2 MG/2ML IJ SOLN: 0.5000 mg | Freq: Once | INTRAMUSCULAR | Status: DC | PRN

## 2019-04-17 MED ORDER — SUGAMMADEX SODIUM 200 MG/2ML IV SOLN
INTRAVENOUS | Status: DC | PRN
  Administered 2019-04-17: 200 mg via INTRAVENOUS

## 2019-04-17 SURGICAL SUPPLY — 27 items
APPLICATOR SURGIFLO ENDO (HEMOSTASIS) ×1 IMPLANT
BAG URO CATCHER STRL LF (MISCELLANEOUS) ×2 IMPLANT
BASKET LASER NITINOL 1.9FR (BASKET) ×1 IMPLANT
BASKET ZERO TIP NITINOL 2.4FR (BASKET) IMPLANT
BLADE SURG SZ10 CARB STEEL (BLADE) ×1 IMPLANT
CATH INTERMIT  6FR 70CM (CATHETERS) ×2 IMPLANT
CATH URET DUAL LUMEN 6-10FR 50 (CATHETERS) ×2 IMPLANT
CLOTH BEACON ORANGE TIMEOUT ST (SAFETY) ×1 IMPLANT
COVER WAND RF STERILE (DRAPES) IMPLANT
EXTRACTOR STONE 1.7FRX115CM (UROLOGICAL SUPPLIES) IMPLANT
FIBER LASER FLEXIVA 365 (UROLOGICAL SUPPLIES) ×2 IMPLANT
FIBER LASER TRAC TIP (UROLOGICAL SUPPLIES) ×1 IMPLANT
GLOVE BIOGEL M 8.0 STRL (GLOVE) ×2 IMPLANT
GOWN STRL REUS W/TWL XL LVL3 (GOWN DISPOSABLE) ×2 IMPLANT
GUIDEWIRE ANG ZIPWIRE 038X150 (WIRE) IMPLANT
GUIDEWIRE STR DUAL SENSOR (WIRE) ×3 IMPLANT
IV NS 1000ML (IV SOLUTION)
IV NS 1000ML BAXH (IV SOLUTION) ×1 IMPLANT
KIT TURNOVER KIT A (KITS) ×1 IMPLANT
MANIFOLD NEPTUNE II (INSTRUMENTS) ×2 IMPLANT
PACK CYSTO (CUSTOM PROCEDURE TRAY) ×2 IMPLANT
SHEATH URETERAL 12FRX28CM (UROLOGICAL SUPPLIES) ×1 IMPLANT
SHEATH URETERAL 12FRX35CM (MISCELLANEOUS) ×1 IMPLANT
STENT CONTOUR 7FRX24 (STENTS) ×1 IMPLANT
SURGIFLO W/THROMBIN 8M KIT (HEMOSTASIS) ×1 IMPLANT
TUBING CONNECTING 10 (TUBING) ×2 IMPLANT
TUBING UROLOGY SET (TUBING) ×2 IMPLANT

## 2019-04-17 NOTE — Anesthesia Postprocedure Evaluation (Signed)
Anesthesia Post Note  Patient: [name]  Procedure(s) Performed: CYSTOSCOPY WITH LEFT  RETROGRADE PYELOGRAM, LEFT URETEROSCOPY, HOLMIUM LASER AND LEFT STENT PLACEMENT (Left )     Patient location during evaluation: PACU Anesthesia Type: General Level of consciousness: awake and alert, patient cooperative and oriented Pain management: pain level controlled Vital Signs Assessment: post-procedure vital signs reviewed and stable Respiratory status: spontaneous breathing, nonlabored ventilation and respiratory function stable Cardiovascular status: blood pressure returned to baseline and stable Postop Assessment: no apparent nausea or vomiting Anesthetic complications: no    Last Vitals:  Vitals:   04/17/19 1005 04/17/19 1019  BP: (!) 168/71 (!) 165/72  Pulse: 86 83  Resp: 16 14  Temp:    SpO2: 95% 95%    Last Pain:  Vitals:   04/17/19 1030  TempSrc:   PainSc: 5                  Shamere Campas,E. Larsen Dungan

## 2019-04-17 NOTE — Anesthesia Procedure Notes (Signed)
Procedure Name: Intubation Date/Time: 04/17/2019 7:50 AM Performed by: Lavina Hamman, CRNA Pre-anesthesia Checklist: Patient identified, Emergency Drugs available, Suction available, Patient being monitored and Timeout performed Patient Re-evaluated:Patient Re-evaluated prior to induction Oxygen Delivery Method: Circle system utilized Preoxygenation: Pre-oxygenation with 100% oxygen Induction Type: IV induction Ventilation: Mask ventilation without difficulty Laryngoscope Size: Mac and 3 Grade View: Grade I Tube type: Oral Tube size: 7.0 mm Number of attempts: 1 Airway Equipment and Method: Stylet Placement Confirmation: ETT inserted through vocal cords under direct vision,  positive ETCO2,  CO2 detector and breath sounds checked- equal and bilateral Secured at: 21 cm Tube secured with: Tape Dental Injury: Teeth and Oropharynx as per pre-operative assessment

## 2019-04-17 NOTE — H&P (Signed)
HPI: [name] is a 73 year-old female with left renal calculi.  The problem is on the left side. This is not her first kidney stone. She has had more than 5 stones prior to getting this one. She has not caught a stone in her urine strainer since her symptoms began.   She has had eswl, ureteral stent, and ureteroscopy for treatment of her stones in the past.   03/21/19: She returns after undergoing a left PCNL on 02/20/19. The procedure was somewhat hampered by the access provided by interventional Radiology through the upper pole but was placed through an infundibulum which did not allow access to a stone in the lower part of the upper pole in the calyx that was parallel to that nor did allow access to the large upper pole calculus. A small amount of stone was treated however there was an opening in the renal pelvis. I therefore left both a nephrostomy catheter as well as a Kumpe the catheter down the ureter. The patient has been bothered by irritative symptoms from the Kumpe the catheter in the bladder. She has been on suppressive antibiotics which has seemed to help somewhat. She presents today for a nephrostogram. She has undergone premedication with corticosteroids and Benadryl.     ALLERGIES: Band Aid - Other Reaction, Skin blisters Contrast Dye - Skin Rash (Moderate), Hives (Mild), Redness (Moderate to Severe) Doxycycline - Nausea Sulfa Drugs - Other Reaction, UNKNOWN    MEDICATIONS: Cardizem  Eliquis  Miralax  Montelukast Sodium  Oxycodone Hcl  Oxycodone-Acetaminophen 5 mg-325 mg tablet 1 tablet PO Q 6 H PRN  Prednisone 50 mg tablet 1 tablet PO As Directed Take 1 pill 13, 7 and 1 hours prior to your schedule procedure.  Senna-Docusate Sodium  Voltaren 1 % gel     GU PSH: Catheterization For Collection Of Specimen, Single Patient, All Places Of Service - 01/19/2019, 11/30/2018 Catheterize For Residual - 01/19/2019, 11/30/2018 Cysto Uretero Lithotripsy - 2014 Cystoscopy -  12/13/2018 ESWL - 2013, 2010, 2010 Percut Stone Removal >2cm, Left - 02/13/2019 Ureteroscopic laser litho, Right - 11/22/2017       PSH Notes: back surgery- screw and rods 05/06/2018   NON-GU PSH: Back Surgery (Unspecified) Cholecystectomy (open) - 2010 Hand/finger Surgery Revise Knee Joint - 2013 Rotator cuff surgery     GU PMH: Renal calculus - 07/12/2018, - 06/16/2018, - 06/03/2018 (Stable), She will be scheduled for right ureteroscopy and laser lithotripsy of her right renal calculi., - 10/19/2017, - 2019 (Stable), I discussed with her the fact that she has known renal calculi that are not causing obstruction or pain. Her multiple imaging studies in the hospital revealed no loss of renal parenchyma, no increase in size of the stones when compared with previous imaging studies, no evidence of obstruction and in addition she has normal renal function. We have been following these and will continue to do so as she would not be a good surgical candidate as we have discussed in the past., - 2018, Renal calculus, bilateral, - 2017, Staghorn calculus, - 2017 History of urolithiasis (Stable), She is now stone free. I will have her return in 6 months for a KUB. - 11/30/2017 Gross hematuria - 2019 Flank Pain (Worsening, Chronic), Right, Culture urine. No ABX required at this time unless culture proven UTi. Ketorolac 30 mg IM today. No acute stone noted along right ureter. Stable bilateral renal stones. Will refill Oxycodone at this time and she understands if pain persists or worsens will need CT  urogram. - 06/17/2017 Mixed incontinence, Urge and stress incontinence - 2014 Ureteral calculus, Proximal Ureteral Stone On The Right - 2014      PMH Notes: She has a history of medullary sponge kidney and kidney stones and has undergone lithotripsy twice in the past for stones on the right-hand side. She has also required ureteroscopy for residual fragments after her first lithotripsy. On a CT scan done in 1/10  she had no right renal calculi and a 2.4 x 1.2 cm stone in the upper portion of her right kidney without evidence of obstruction. Hounsfield units were ~800-900. A KUB in 9/12 revealed the stone had increased in size to 3 x 3 cm.  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  Right UPJ stone treated with ESL in 11/13  24 hour urine: She was found to have no significant abnormality.  Stone analysis: calcium oxalate and calcium phosphate.    Mixed urinary incontinence: She has mild incontinence with coughing and sneezing but occasionally will also have significant urgency and what sounds like urge incontinence with associated nocturia 2-3 times     NON-GU PMH: Pyuria/other UA findings (Stable), At the time of cystoscopy I obtained a urine specimen which will be sent for culture again today. - 12/13/2018, She did have some pyuria and bacteriuria today. I will culture her urine., - 11/30/2017 Bacteriuria (Stable), She appears to have bacteriuria but has no symptoms other than malodorous urine. We discussed the fact that this is not an indication for antibiotic therapy and I would not recommend anyone placing her on antibiotics based on her urinalysis alone if she is asymptomatic. - 10/04/2018 Encounter for general adult medical examination without abnormal findings, Encounter for preventive health examination - 2017 Anxiety, Anxiety - 2014 Medullary cystic kidney, Congenital medullary sponge kidney - 2014 Atrial Fibrillation    FAMILY HISTORY: Death In The Family Father - Father Death In The Family Mother - Father Diabetes - Mother Family Health Status Number - Father Heart Attack - Brother Heart Disease - Mother Lung Cancer - Mother, Father   SOCIAL HISTORY: Marital Status: Married Preferred Language: English; Ethnicity: Not Hispanic Or Latino; Race: White Current Smoking Status: Patient has never smoked.    Tobacco Use Assessment Completed: Used Tobacco in last 30 days? Does not use smokeless tobacco. Has never drank.  Does not use drugs. Drinks 2 caffeinated drinks per day.    REVIEW OF SYSTEMS:    GU Review Female:   Patient denies frequent urination, hard to postpone urination, burning /pain with urination, get up at night to urinate, leakage of urine, stream starts and stops, trouble starting your stream, have to strain to urinate, and being pregnant.  Gastrointestinal (Upper):   Patient denies nausea, vomiting, and indigestion/ heartburn.  Gastrointestinal (Lower):   Patient denies diarrhea and constipation.  Constitutional:   Patient denies fever, night sweats, weight loss, and fatigue.  Skin:   Patient denies itching and skin rash/ lesion.  Eyes:   Patient denies blurred vision and double vision.  Ears/ Nose/ Throat:   Patient denies sore throat and sinus problems.  Hematologic/Lymphatic:   Patient denies swollen glands and easy bruising.  Cardiovascular:   Patient denies leg swelling and chest pains.  Respiratory:   Patient denies cough and shortness of breath.  Endocrine:   Patient denies excessive thirst.  Musculoskeletal:   Patient denies back pain and joint pain.  Neurological:   Patient denies headaches and dizziness.  Psychologic:   Patient denies depression and anxiety.   VITAL SIGNS:  Weight 201 lb / 91.17 kg  Height 62 in / 157.48 cm  BP 149/73 mmHg  Pulse 71 /min  Temperature 97.5 F / 36.3 C  BMI 36.8 kg/m   GU PHYSICAL EXAMINATION:    External Genitalia: No hirsutism, no rash, no scarring, no cyst, no erythematous lesion, no papular lesion, no blanched lesion, no warty lesion. No edema.  Urethral Meatus: Normal size. Normal position. No discharge.  Urethra: No tenderness, no mass, no scarring. No hypermobility. No leakage.  Vagina: No atrophy, no stenosis. No rectocele. No cystocele. No enterocele.   MULTI-SYSTEM PHYSICAL EXAMINATION:    Constitutional:  Well-nourished. No physical deformities. Normally developed. Good grooming.  Neck: Neck symmetrical, not swollen. Normal tracheal position.  Respiratory: No labored breathing, no use of accessory muscles.   Cardiovascular: Normal temperature, normal extremity pulses, no swelling, no varicosities.  Lymphatic: No enlargement of neck, axillae, groin.  Skin: No paleness, no jaundice, no cyanosis. No lesion, no ulcer, no rash.  Neurologic / Psychiatric: Oriented to time, oriented to place, oriented to person. No depression, no anxiety, no agitation.  Gastrointestinal: Obese abdomen. No mass, no tenderness, no rigidity.   Eyes: Normal conjunctivae. Normal eyelids.  Ears, Nose, Mouth, and Throat: Left ear no scars, no lesions, no masses. Right ear no scars, no lesions, no masses. Nose no scars, no lesions, no masses. Normal hearing. Normal lips.  Musculoskeletal: Normal gait and station of head and neck.     PAST DATA REVIEWED:  Source Of History:  Patient  Records Review:   Previous Patient Records, POC Tool  X-Ray Review: Interventional Radiology: Reviewed Films.  C.T. Abdomen/Pelvis: Reviewed Films.     PROCEDURES:         Nephrostogram/ Loopogram - [id number]  Non-ionic radio-opaque contrast dye (OptiRay) is injected into the patient's existing nephrostomy tube. A single AP film was obtained and there was no contrast seen within the collecting system. I did not want to inject under pressure due to her history of contrast allergy.       Urinalysis w/Scope Dipstick Dipstick Cont'd Micro  Color: Red Bilirubin: Neg mg/dL WBC/hpf: 40 - 60/hpf  Appearance: Turbid Ketones: Trace mg/dL RBC/hpf: Packed/hpf  Specific Gravity: 1.025 Blood: 3+ ery/uL Bacteria: Mod (26-50/hpf)  pH: 5.5 Protein: 3+ mg/dL Cystals: NS (Not Seen)  Glucose: Neg mg/dL Urobilinogen: 0.2 mg/dL Casts: NS (Not Seen)    Nitrites: Positive Trichomonas: Not Present    Leukocyte Esterase: 3+ leu/uL Mucous: Not Present      Epithelial  Cells: 0 - 5/hpf      Yeast: NS (Not Seen)      Sperm: Not Present    Notes: Microscopic not concentrated.         Notes:   I removed her Foley catheter nephrostomy tube and left the Kumpe in place. I then applied good dry gauze dressing. The patient had no difficulties or reaction from a contrast.   ASSESSMENT:      ICD-10 Details  2 GU:   Renal calculus - N20.0 Left, Stable - She has a large stone burden but it does not appear that access from percutaneous approach is going to be feasible. I therefore will plan to manage these stones ureteroscopically.  1 NON-GU:   Pyuria/other UA findings - R82.79 She had pyuria and bacteriuria today and her urine was nitrite positive so her urine will be  cultured and she will be placed on antibiotics.              Notes:   Because her nephrostomy tube was placed through a location between her stones I do not believe that this will be access that I will be able to use in the future. I did not want to remove her Kumpe catheter initially but she did well over the following few days and therefore return to the office and had it removed. I will try to manage her ureteroscopically.    PLAN: She presents today for left ureteroscopy and laser lithotripsy of her right renal calculi. Clearance was obtained from her cardiologist to stop her Eliquis 2 days prior to her procedure.

## 2019-04-17 NOTE — Op Note (Signed)
PATIENT:  [name]  PRE-OPERATIVE DIAGNOSIS: Left renal calculi  POST-OPERATIVE DIAGNOSIS: Same  PROCEDURE:  1. Cystoscopy with left retrograde pyelogram including interpretation. 2. Left ureteroscopy, laser lithotripsy and stent placement. 3. Fluoroscopy time 2 hours  SURGEON: Claybon Jabs, MD  INDICATION: [name] is a 73 year old female with a history of calculus disease.  She developed a large stone burden in the upper pole of her left kidney.  We discussed the options for management and she initially elected to proceed with PCNL due to the large size of the stone however access was not ideal at the time of her procedure and therefore I could access a minimal amount of stone.  She still has a large stone burden and had a nephrostomy tube present but it became dislodged so I was unable to access the kidney through her nephrostomy site and therefore have recommended we try and treat her stone ureteroscopically.  She has been off her Eliquis 3 days.  ANESTHESIA:  General  EBL:  Minimal  DRAINS: 7 French, 24 cm double-J stent (no string)   DESCRIPTION OF PROCEDURE: The patient was taken to the major OR and placed on the table. General anesthesia was administered and then the patient was moved to the dorsal lithotomy position. The genitalia was sterilely prepped and draped. An official timeout was performed.  Initially the 17 French cystoscope with 30 lens was passed under direct vision into the bladder. The bladder was then fully inspected. It was noted be free of any tumors, stones or inflammatory lesions. Ureteral orifices were of normal configuration and position. A 6 French open-ended ureteral catheter was then passed through the cystoscope into the ureteral orifice in order to perform a left retrograde pyelogram.  A retrograde pyelogram was performed by injecting full-strength contrast, under very low pressure, up the left ureter under direct fluoroscopic control. It  revealed the stones as filling defects in the upper pole calyces.. The remainder of the ureter was noted to be normal as was the intrarenal collecting system. I then passed a 0.038 inch floppy-tipped guidewire through the open ended catheter and into the area of the renal pelvis and this was left in place.  I then placed a dual-lumen ureteral catheter over the guidewire and a second guidewire was then placed as a safety guidewire.  Both were left in place and I then passed a ureteral access sheath over the working guidewire into the area of the renal pelvis and remove the inner portion of the access sheath as well as guidewire.  A 6 French dual-lumen, flexible ureteroscope was then passed through the access sheath and the stone in the upper pole was visualized.. The 365  holmium laser fiber was used to fragment the stone.  This took a great deal of time but I was able to fully fragment the stone and then used a high-frequency rate to "popcorn" the stone into small fragments.  There was a great deal of stone material however this appeared to be quite successful.  I then directed my attention to the second, smaller stone and a more lateral portion of the upper pole calyceal system and treated this in a similar fashion with good result as well.  Due to the very large stone burden it was not feasible to try and extract all of the stone fragments and therefore I continued to try and dust the fragments which appeared to be proceeding nicely however as this was being performed she developed bleeding which made it  difficult to visualize the stone further and therefore it was not felt that it would be safe to continue.  Bleeding seemed fairly brisk so what I elected to do was remove the ureteroscope and the access sheath, placed the dual-lumen ureteral catheter over the guidewire and then attempted to inject FloSeal through the open port of the dual-lumen catheter but there was too much resistance.  I therefore placed a  second guidewire and left both of these in place.  The access sheath was then passed over the guidewire and I was able to inject the FloSeal into the area of the upper pole calyx as determined by positioning this with fluoroscopy.  I was then able to inject the FloSeal into the upper pole.  I then removed the access sheath with the second guidewire remaining in place.    I then backloaded the cystoscope over the guidewire and passed the stent over the guidewire into the area of the renal pelvis. As the guidewire was removed good curl was noted in the renal pelvis. The bladder was drained and the cystoscope was then removed. The patient tolerated the procedure well no intraoperative complications.  PLAN OF CARE: Discharge to home after PACU  PATIENT DISPOSITION:  PACU - hemodynamically stable.

## 2019-04-17 NOTE — Discharge Instructions (Signed)
Post stone removal/stent placement surgery instructions   Definitions:  Ureter: The duct that transports urine from the kidney to the bladder. Stent: A plastic hollow tube that is placed into the ureter, from the kidney to the bladder to prevent the ureter from swelling shut.  General instructions:  Despite the fact that no skin incisions were used, the area around the ureter and bladder is raw and irritated. The stent is a foreign body which will further irritate the bladder wall. This irritation is manifested by increased frequency of urination, both day and night, and by an increase in the urge to urinate. In some, the urge to urinate is present almost always. Sometimes the urge is strong enough that you may not be able to stop your self from urinating. The only real cure is to remove the stent and then give time for the bladder wall to heal which can't be done until the danger of the ureter swelling shut has passed. (This varies from 2-21 days).  You may see some blood in your urine while the stent is in place and a few days afterward. Do not be alarmed, even if the urine is clear for a while. Get off your feet and drink lots of fluids until clearing occurs. If you start to pass clots or don't improve, call us.  If you have a string coming from your urethra:  The stent string is attached to your ureteral stent.  Do not pull on thisIf you have a string coming from your urethra:  The stent string is attached to your ureteral stent.  Do not pull on this.  Diet:  You may return to your normal diet immediately. Because of the raw surface of your bladder, alcohol, spicy foods, foods high in acid and drinks with caffeine may cause irritation or frequency and should be used in moderation. To keep your urine flowing freely and avoid constipation, drink plenty of fluids during the day (8-10 glasses). Tip: Avoid cranberry juice because it is very acidic.  Activity:  Your physical activity doesn't need  to be restricted. However, if you are very active, you may see some blood in the urine. We suggest that you reduce your activity under the circumstances until the bleeding has stopped.  Bowels:  It is important to keep your bowels regular during the postoperative period. Straining with bowel movements can cause bleeding. A bowel movement every other day is reasonable. Use a mild laxative if needed, such as milk of magnesia 2-3 tablespoons, or 2 Dulcolax tablets. Call if you continue to have problems. If you had been taking narcotics for pain, before, during or after your surgery, you may be constipated. Take a laxative if necessary.     Medication:  You should resume your pre-surgery medications unless told not to. DO NOT RESUME YOUR ASPIRIN, or any other medicines like ibuprofen, motrin, excedrin, advil, aleve, vitamin E, fish oil as these can all cause bleeding x 7 days. In addition you may be given an antibiotic to prevent or treat infection. Antibiotics are not always necessary. All medication should be taken as prescribed until the bottles are finished unless you are having an unusual reaction to one of the drugs.  Hold Eliquis for 48 hours.  If there is still bleeding at that time contact my office for instructions regarding when it should be resumed.  Problems you should report to [place]:  a. Fever greater than 101F. b. Heavy bleeding, or clots (see notes above about blood in urine).  c. Inability to urinate. d. Drug reactions (hives, rash, nausea, vomiting, diarrhea). e. Severe burning or pain with urination that is not improving.  Followup:  You will need a followup appointment to monitor your progress in most cases. Please call the office for this appointment when you get home if your appointment has not already been scheduled. Usually the first appointment will be about 5-14 days after your surgery and if you have a stent in place it will likely be removed at that time.   General  Anesthesia, Adult, Care After This sheet gives you information about how to care for yourself after your procedure. Your health care provider may also give you more specific instructions. If you have problems or questions, contact your health care provider. What can I expect after the procedure? After the procedure, the following side effects are common:  Pain or discomfort at the IV site.  Nausea.  Vomiting.  Sore throat.  Trouble concentrating.  Feeling cold or chills.  Weak or tired.  Sleepiness and fatigue.  Soreness and body aches. These side effects can affect parts of the body that were not involved in surgery. Follow these instructions at home:  For at least 24 hours after the procedure:  Have a responsible adult stay with you. It is important to have someone help care for you until you are awake and alert.  Rest as needed.  Do not: ? Participate in activities in which you could fall or become injured. ? Drive. ? Use heavy machinery. ? Drink alcohol. ? Take sleeping pills or medicines that cause drowsiness. ? Make important decisions or sign legal documents. ? Take care of children on your own. Eating and drinking  Follow any instructions from your health care provider about eating or drinking restrictions.  When you feel hungry, start by eating small amounts of foods that are soft and easy to digest (bland), such as toast. Gradually return to your regular diet.  Drink enough fluid to keep your urine pale yellow.  If you vomit, rehydrate by drinking water, juice, or clear broth. General instructions  If you have sleep apnea, surgery and certain medicines can increase your risk for breathing problems. Follow instructions from your health care provider about wearing your sleep device: ? Anytime you are sleeping, including during daytime naps. ? While taking prescription pain medicines, sleeping medicines, or medicines that make you drowsy.  Return to your  normal activities as told by your health care provider. Ask your health care provider what activities are safe for you.  Take over-the-counter and prescription medicines only as told by your health care provider.  If you smoke, do not smoke without supervision.  Keep all follow-up visits as told by your health care provider. This is important. Contact a health care provider if:  You have nausea or vomiting that does not get better with medicine.  You cannot eat or drink without vomiting.  You have pain that does not get better with medicine.  You are unable to pass urine.  You develop a skin rash.  You have a fever.  You have redness around your IV site that gets worse. Get help right away if:  You have difficulty breathing.  You have chest pain.  You have blood in your urine or stool, or you vomit blood. Summary  After the procedure, it is common to have a sore throat or nausea. It is also common to feel tired.  Have a responsible adult stay with you for the  first 24 hours after general anesthesia. It is important to have someone help care for you until you are awake and alert.  When you feel hungry, start by eating small amounts of foods that are soft and easy to digest (bland), such as toast. Gradually return to your regular diet.  Drink enough fluid to keep your urine pale yellow.  Return to your normal activities as told by your health care provider. Ask your health care provider what activities are safe for you. This information is not intended to replace advice given to you by your health care provider. Make sure you discuss any questions you have with your health care provider. Document Released: 10/05/2000 Document Revised: 07/02/2017 Document Reviewed: 02/12/2017 Elsevier Patient Education  2020 [employer].

## 2019-04-17 NOTE — Transfer of Care (Signed)
Immediate Anesthesia Transfer of Care Note  Patient: [name]  Procedure(s) Performed: CYSTOSCOPY WITH LEFT  RETROGRADE PYELOGRAM, LEFT URETEROSCOPY, HOLMIUM LASER AND LEFT STENT PLACEMENT (Left )  Patient Location: PACU  Anesthesia Type:General  Level of Consciousness: awake, alert  and oriented  Airway & Oxygen Therapy: Patient Spontanous Breathing and Patient connected to face mask oxygen  Post-op Assessment: Report given to RN  Post vital signs: Reviewed and stable  Last Vitals:  Vitals Value Taken Time  BP 156/66 04/17/19 0937  Temp    Pulse 87 04/17/19 0942  Resp 23 04/17/19 0942  SpO2 95 % 04/17/19 0942  Vitals shown include unvalidated device data.  Last Pain:  Vitals:   04/17/19 0622  TempSrc:   PainSc: 0-No pain         Complications: No apparent anesthesia complications

## 2019-04-18 ENCOUNTER — Encounter (HOSPITAL_COMMUNITY): Payer: Self-pay | Admitting: Urology

## 2019-04-18 DIAGNOSIS — N200 Calculus of kidney: Secondary | ICD-10-CM | POA: Diagnosis not present

## 2019-04-24 DIAGNOSIS — N200 Calculus of kidney: Secondary | ICD-10-CM | POA: Diagnosis not present

## 2019-05-23 DIAGNOSIS — R310 Gross hematuria: Secondary | ICD-10-CM | POA: Diagnosis not present

## 2019-05-23 DIAGNOSIS — R8271 Bacteriuria: Secondary | ICD-10-CM | POA: Diagnosis not present

## 2019-05-23 DIAGNOSIS — N200 Calculus of kidney: Secondary | ICD-10-CM | POA: Diagnosis not present

## 2019-05-26 ENCOUNTER — Other Ambulatory Visit: Payer: Self-pay | Admitting: Urology

## 2019-05-26 ENCOUNTER — Telehealth: Payer: Self-pay | Admitting: Interventional Cardiology

## 2019-05-26 NOTE — Telephone Encounter (Signed)
Pt takes Eliquis for afib with CHADS2VASc score of 4 (age, sex, CHF, HTN), pt also prediabetic. Renal function is normal. Generally would recommend holding Eliquis for 2 days prior to procedure. If 3 day hold is necessary, would be ok.

## 2019-05-26 NOTE — Telephone Encounter (Signed)
   Primary Cardiologist: [name], MD  Chart reviewed as part of pre-operative protocol coverage. Patient was contacted 05/26/2019 in reference to pre-operative risk assessment for pending surgery as outlined below.  [name] was last seen on 08/2018 by Dr. Tamala Julian - h/o PAF, 1st degree AVB, chronic diastolic CHF, HLD, LEE, obesity. Felt to be doing well at that time, aldactone started for swelling. I see prior clearances for kidney procedures with varying durations of Eliquis hold so will route to pharm for input on Eliquis then pt will need f/u call.  Charlie Pitter, PA-C 05/26/2019, 3:25 PM

## 2019-05-26 NOTE — Telephone Encounter (Signed)
   Primary Cardiologist: [name], MD  Chart reviewed as part of pre-operative protocol coverage. Patient was contacted 05/26/2019 in reference to pre-operative risk assessment for pending surgery as outlined below.   During our conversation she did not report any chest pain or dyspnea but notes she has developed completely asymmetric swelling in the left leg that has been gradually getting worse over the last 2 weeks. When she walks on it, it feels like a balloon is going to pop. She states it's unusual for only one leg to swell. She has been compliant with Eliquis but has also had worsening bright red hematuria. She states she was told by urology it was probably the Eliquis rather than a kidney issue. I told her we do not typically expect that degree of bleeding unless there is an underlying issue causing an area to bleed. Tricky situation, especially given her new asymmetric left leg swelling - cannot exclude DVT over the phone. Would be hesitant to stop anticoagulation for her hematuria if she has in fact developed VTE. Offices are now closed for the day. I have advised she proceed to the ED for evaluation. She is in agreement with this plan.  Will keep in preop box for team to follow back up with on Monday to see what came out of ER eval, and to finalize clearance.  Charlie Pitter, PA-C 05/26/2019, 5:33 PM

## 2019-05-26 NOTE — Telephone Encounter (Signed)
° °  [facility] Pre-operative Risk Assessment    Request for surgical clearance:  1. What type of surgery is being performed? L Ureteroscopy w laser lithotripsy  2. When is this surgery scheduled? TBD based on clearance   3. What type of clearance is required (medical clearance vs. Pharmacy clearance to hold med vs. Both)? Pharmacy  4. Are there any medications that need to be held prior to surgery and how long? eliquis 3 days  5. Practice name and name of physician performing surgery? Dr. Karsten Ro, [facility]  6. What is your office phone number: [phone] , Marlowe Kays   7.   What is your office fax number: [phone]   8.   Anesthesia type (None, local, MAC, general) ? general   Johnna Acosta 05/26/2019, 2:39 PM  _________________________________________________________________   (provider comments below)

## 2019-05-29 DIAGNOSIS — I10 Essential (primary) hypertension: Secondary | ICD-10-CM | POA: Diagnosis not present

## 2019-05-29 DIAGNOSIS — R609 Edema, unspecified: Secondary | ICD-10-CM | POA: Diagnosis not present

## 2019-05-29 NOTE — Telephone Encounter (Signed)
    LMTCB 05/29/19 to follow up from conversation with pre-op Friday 05/26/2019.

## 2019-05-31 ENCOUNTER — Encounter (HOSPITAL_COMMUNITY): Payer: Self-pay

## 2019-05-31 NOTE — Telephone Encounter (Signed)
Called and spoke with patient to follow-up on Dayna's call to her on Friday 05/26/2019. She never went to the ED on Friday night. However, she went to see her PCP on Monday who prescribed a "fluid pill" which patient states has helped. Legs are now about the same size. However, patient is going to for some lab work and Acupuncturist at [facility] and states PCP has also ordered lower extremity doppler to make sure she does not have an acute DVT. Asked patient to call us tomorrow with these results before giving her the OK to stop Eliquis prior to procedure.   Will leave in pre-op pool so we can follow-up on this tomorrow.

## 2019-05-31 NOTE — Patient Instructions (Signed)
DUE TO COVID-19 ONLY ONE VISITOR IS ALLOWED TO COME WITH YOU AND STAY IN THE WAITING ROOM ONLY DURING PRE OP AND PROCEDURE DAY OF SURGERY. THE 1 VISITOR MAY VISIT WITH YOU AFTER SURGERY IN YOUR PRIVATE ROOM DURING VISITING HOURS ONLY!  YOU NEED TO HAVE A COVID 19 TEST ON_11/19______ @_______ , THIS TEST MUST BE DONE BEFORE SURGERY, COME  [address] [place] , [address].  ([place]) ONCE YOUR COVID TEST IS COMPLETED, PLEASE BEGIN THE QUARANTINE INSTRUCTIONS AS OUTLINED IN YOUR HANDOUT.                [name]    Your procedure is scheduled on: 06/05/19   Report to [facility] Main  Entrance   Report to admitting at  7:30 AM     Call this number if you have problems the morning of surgery [phone]    Remember: Do not eat food or drink liquids :After Midnight.   BRUSH YOUR TEETH MORNING OF SURGERY AND RINSE YOUR MOUTH OUT, NO CHEWING GUM CANDY OR MINTS.     Take these medicines the morning of surgery with A SIP OF WATER: Singular, Diltiazem                                 You may not have any metal on your body including hair pins and              piercings  Do not wear jewelry, make-up, lotions, powders or perfumes, deodorant             Do not wear nail polish on your fingernails.  Do not shave  48 hours prior to surgery.              Do not bring valuables to the hospital. [place].  Contacts, dentures or bridgework may not be worn into surgery.       Patients discharged the day of surgery will not be allowed to drive home.   IF YOU ARE HAVING SURGERY AND GOING HOME THE SAME DAY, YOU MUST HAVE AN ADULT TO DRIVE YOU HOME AND BE WITH YOU FOR 24 HOURS.   YOU MAY GO HOME BY TAXI OR UBER OR ORTHERWISE, BUT AN ADULT MUST ACCOMPANY YOU HOME AND STAY WITH YOU FOR 24 HOURS.  Name and phone number of your driver:  Special Instructions: N/A              Please read over the following fact  sheets you were given: _____________________________________________________________________             [facility] - Preparing for Surgery  Before surgery, you can play an important role.   Because skin is not sterile, your skin needs to be as free of germs as possible.   You can reduce the number of germs on your skin by washing with CHG (chlorahexidine gluconate) soap before surgery.   CHG is an antiseptic cleaner which kills germs and bonds with the skin to continue killing germs even after washing. Please DO NOT use if you have an allergy to CHG or antibacterial soaps.   If your skin becomes reddened/irritated stop using the CHG and inform your nurse when you arrive at Short Stay. Do not shave (including legs and underarms) for at least  48 hours prior to the first CHG shower.    Please follow these instructions carefully:  1.  Shower with CHG Soap the night before surgery and the  morning of Surgery.  2.  If you choose to wash your hair, wash your hair first as usual with your  normal  shampoo.  3.  After you shampoo, rinse your hair and body thoroughly to remove the  shampoo.                                        4.  Use CHG as you would any other liquid soap.  You can apply chg directly  to the skin and wash                       Gently with a scrungie or clean washcloth.  5.  Apply the CHG Soap to your body ONLY FROM THE NECK DOWN.   Do not use on face/ open                           Wound or open sores. Avoid contact with eyes, ears mouth and genitals (private parts).                       Wash face,  Genitals (private parts) with your normal soap.             6.  Wash thoroughly, paying special attention to the area where your surgery  will be performed.  7.  Thoroughly rinse your body with warm water from the neck down.  8.  DO NOT shower/wash with your normal soap after using and rinsing off  the CHG Soap.             9.  Pat yourself dry with a clean towel.            10.   Wear clean pajamas.            11.  Place clean sheets on your bed the night of your first shower and do not  sleep with pets. Day of Surgery : Do not apply any lotions/deodorants the morning of surgery.  Please wear clean clothes to the hospital/surgery center.  FAILURE TO FOLLOW THESE INSTRUCTIONS MAY RESULT IN THE CANCELLATION OF YOUR SURGERY PATIENT SIGNATURE_________________________________  NURSE SIGNATURE__________________________________  ________________________________________________________________________

## 2019-06-01 ENCOUNTER — Other Ambulatory Visit: Payer: Self-pay | Admitting: Internal Medicine

## 2019-06-01 ENCOUNTER — Inpatient Hospital Stay (HOSPITAL_COMMUNITY): Admission: RE | Admit: 2019-06-01 | Payer: PPO | Source: Ambulatory Visit

## 2019-06-01 ENCOUNTER — Encounter (HOSPITAL_COMMUNITY)
Admission: RE | Admit: 2019-06-01 | Discharge: 2019-06-01 | Disposition: A | Discharge status: Home / Self Care | Payer: PPO | Source: Ambulatory Visit | Attending: Internal Medicine | Admitting: Internal Medicine

## 2019-06-01 ENCOUNTER — Telehealth (HOSPITAL_COMMUNITY): Payer: Self-pay | Admitting: *Deleted

## 2019-06-01 NOTE — Telephone Encounter (Signed)
The above patient or their representative was contacted and gave the following answers to these questions:         Do you have any of the following symptoms?    NO  Fever                    Cough                   Shortness of breath  Do  you have any of the following other symptoms?    muscle pain         vomiting,        diarrhea        rash         weakness        red eye        abdominal pain         bruising          bruising or bleeding              joint pain           severe headache    Have you been in contact with someone who was or has been sick in the past 2 weeks?  NO  Yes                 Unsure                         Unable to assess   Does the person that you were in contact with have any of the following symptoms?   Cough         shortness of breath           muscle pain         vomiting,            diarrhea            rash            weakness           fever            red eye           abdominal pain           bruising  or  bleeding                joint pain                severe headache                 COMMENTS OR ACTION PLAN FOR THIS PATIENT:   06/01/19 no to all per [facility] w/[facility]

## 2019-06-02 ENCOUNTER — Other Ambulatory Visit (HOSPITAL_COMMUNITY): Payer: Self-pay | Admitting: Internal Medicine

## 2019-06-02 ENCOUNTER — Other Ambulatory Visit: Payer: Self-pay

## 2019-06-02 ENCOUNTER — Ambulatory Visit (HOSPITAL_COMMUNITY)
Admission: RE | Admit: 2019-06-02 | Discharge: 2019-06-02 | Disposition: A | Discharge status: Home / Self Care | Payer: PPO | Source: Ambulatory Visit | Attending: Family | Admitting: Family

## 2019-06-02 DIAGNOSIS — R600 Localized edema: Secondary | ICD-10-CM

## 2019-06-02 NOTE — Telephone Encounter (Signed)
   Primary Cardiologist: [name], MD  Chart reviewed as part of pre-operative protocol coverage. Patient was contacted 06/02/2019 in reference to pre-operative risk assessment for pending surgery as outlined below.  KARYME CALAFIORE was last seen on 08/23/2018 by Dr. Tamala Julian.  Since that day, RONIE RACZKOWSKI has done well without chest pain or shortness of breath.  Therefore, based on ACC/AHA guidelines, the patient would be at acceptable risk for the planned procedure without further cardiovascular testing.   I will route this recommendation to the requesting party via Epic fax function and remove from pre-op pool.  Please call with questions. See below for recommendation by our clinical pharmacist regarding how long to hold eliquis. We recommend to restart eliquis as early as possible after the procedure at the discretion of surgeon based on bleeding risk.   [place], [place] 06/02/2019, 2:19 PM

## 2019-06-13 NOTE — Patient Instructions (Addendum)
DUE TO COVID-19 ONLY ONE VISITOR IS ALLOWED TO COME WITH YOU AND STAY IN THE WAITING ROOM ONLY DURING PRE OP AND PROCEDURE DAY OF SURGERY. THE 1 VISITOR MAY VISIT WITH YOU AFTER SURGERY IN YOUR PRIVATE ROOM DURING VISITING HOURS ONLY!  YOU NEED TO HAVE A COVID 19 TEST ON_Thursday_12/03/2020_____ @__1255  pm_____, THIS TEST MUST BE DONE BEFORE SURGERY, COME  [address], [place] , [address].  ([place]) ONCE YOUR COVID TEST IS COMPLETED, PLEASE BEGIN THE QUARANTINE INSTRUCTIONS AS OUTLINED IN YOUR HANDOUT.                LEWELLYN STANDLEY     Your procedure is scheduled [id number] 06/19/2019   Report to [facility] Main  Entrance    Report to admitting at   [place] AM     Call this number if you have problems the morning of surgery [phone]    Remember: Do not eat food or drink liquids :After Midnight.     BRUSH YOUR TEETH MORNING OF SURGERY AND RINSE YOUR MOUTH OUT, NO CHEWING GUM CANDY OR MINTS.     Take these medicines the morning of surgery with A SIP OF WATER: Diltiazem (Cartia XT), use eye drops if needed                                 You may not have any metal on your body including hair pins and              piercings  Do not wear jewelry, make-up, lotions, powders or perfumes, deodorant             Do not wear nail polish on your fingernails.  Do not shave  48 hours prior to surgery.     Do not bring valuables to the hospital. [place].  Contacts, dentures or bridgework may not be worn into surgery.  Leave suitcase in the car. After surgery it may be brought to your room.     Patients discharged the day of surgery will not be allowed to drive home. IF YOU ARE HAVING SURGERY AND GOING HOME THE SAME DAY, YOU MUST HAVE AN ADULT TO DRIVE YOU HOME AND BE WITH YOU FOR 24 HOURS. YOU MAY GO HOME BY TAXI OR UBER OR ORTHERWISE, BUT AN ADULT MUST ACCOMPANY YOU HOME AND STAY WITH YOU FOR 24  HOURS.  Name and phone number of your driver: spouse- [name]  [phone]               Please read over the following fact sheets you were given: _____________________________________________________________________             [facility] - Preparing for Surgery Before surgery, you can play an important role.  Because skin is not sterile, your skin needs to be as free of germs as possible.  You can reduce the number of germs on your skin by washing with CHG (chlorahexidine gluconate) soap before surgery.  CHG is an antiseptic cleaner which kills germs and bonds with the skin to continue killing germs even after washing. Please DO NOT use if you have an allergy to CHG or antibacterial soaps.  If your skin becomes reddened/irritated stop using the CHG and inform your nurse when you arrive at Short Stay. Do not  shave (including legs and underarms) for at least 48 hours prior to the first CHG shower.  You may shave your face/neck. Please follow these instructions carefully:  1.  Shower with CHG Soap the night before surgery and the  morning of Surgery.  2.  If you choose to wash your hair, wash your hair first as usual with your  normal  shampoo.  3.  After you shampoo, rinse your hair and body thoroughly to remove the  shampoo.                           4.  Use CHG as you would any other liquid soap.  You can apply chg directly  to the skin and wash                       Gently with a scrungie or clean washcloth.  5.  Apply the CHG Soap to your body ONLY FROM THE NECK DOWN.   Do not use on face/ open                           Wound or open sores. Avoid contact with eyes, ears mouth and genitals (private parts).                       Wash face,  Genitals (private parts) with your normal soap.             6.  Wash thoroughly, paying special attention to the area where your surgery  will be performed.  7.  Thoroughly rinse your body with warm water from the neck down.  8.  DO NOT shower/wash  with your normal soap after using and rinsing off  the CHG Soap.                9.  Pat yourself dry with a clean towel.            10.  Wear clean pajamas.            11.  Place clean sheets on your bed the night of your first shower and do not  sleep with pets. Day of Surgery : Do not apply any lotions/deodorants the morning of surgery.  Please wear clean clothes to the hospital/surgery center.  FAILURE TO FOLLOW THESE INSTRUCTIONS MAY RESULT IN THE CANCELLATION OF YOUR SURGERY PATIENT SIGNATURE_________________________________  NURSE SIGNATURE__________________________________  ________________________________________________________________________

## 2019-06-15 ENCOUNTER — Other Ambulatory Visit (HOSPITAL_COMMUNITY)
Admission: RE | Admit: 2019-06-15 | Discharge: 2019-06-15 | Disposition: A | Discharge status: Home / Self Care | Payer: PPO | Source: Ambulatory Visit | Attending: Urology | Admitting: Urology

## 2019-06-15 ENCOUNTER — Encounter (HOSPITAL_COMMUNITY)
Admission: RE | Admit: 2019-06-15 | Discharge: 2019-06-15 | Disposition: A | Discharge status: Home / Self Care | Payer: PPO | Source: Ambulatory Visit | Attending: Urology | Admitting: Urology

## 2019-06-15 ENCOUNTER — Other Ambulatory Visit: Payer: Self-pay

## 2019-06-15 ENCOUNTER — Encounter (HOSPITAL_COMMUNITY): Payer: Self-pay

## 2019-06-15 DIAGNOSIS — Z20828 Contact with and (suspected) exposure to other viral communicable diseases: Secondary | ICD-10-CM | POA: Diagnosis not present

## 2019-06-15 DIAGNOSIS — N200 Calculus of kidney: Secondary | ICD-10-CM | POA: Diagnosis not present

## 2019-06-15 DIAGNOSIS — Z01818 Encounter for other preprocedural examination: Secondary | ICD-10-CM | POA: Diagnosis not present

## 2019-06-15 LAB — CBC
HCT: 36.6 % (ref 36.0–46.0)
Hemoglobin: 11.3 g/dL — ABNORMAL LOW (ref 12.0–15.0)
MCH: 27.8 pg (ref 26.0–34.0)
MCHC: 30.9 g/dL (ref 30.0–36.0)
MCV: 89.9 fL (ref 80.0–100.0)
Platelets: 282 10*3/uL (ref 150–400)
RBC: 4.07 MIL/uL (ref 3.87–5.11)
RDW: 12.5 % (ref 11.5–15.5)
WBC: 7.6 10*3/uL (ref 4.0–10.5)
nRBC: 0 % (ref 0.0–0.2)

## 2019-06-15 LAB — BASIC METABOLIC PANEL
Anion gap: 12 (ref 5–15)
BUN: 18 mg/dL (ref 8–23)
CO2: 23 mmol/L (ref 22–32)
Calcium: 8.9 mg/dL (ref 8.9–10.3)
Chloride: 105 mmol/L (ref 98–111)
Creatinine, Ser: 0.74 mg/dL (ref 0.44–1.00)
GFR calc Af Amer: >60 mL/min (ref >60)
GFR calc non Af Amer: >60 mL/min (ref >60)
Glucose, Bld: 106 mg/dL — ABNORMAL HIGH (ref 70–99)
Potassium: 4.2 mmol/L (ref 3.5–5.1)
Sodium: 140 mmol/L (ref 135–145)

## 2019-06-15 LAB — HEMOGLOBIN A1C
Hgb A1c MFr Bld: 5.9 % — ABNORMAL HIGH (ref 4.8–5.6)
Mean Plasma Glucose: 122.63 mg/dL

## 2019-06-15 NOTE — Progress Notes (Addendum)
PCP - Dr Jani Gravel Cardiologist - Dr. Daneen Schick  Chest x-ray -N/A  EKG - 05/11/2018 Stress Test - 01/09/2015 ECHO - 10/25/2016 Cardiac Cath - 01/16/2015  Sleep Study - N/A CPAP - N/A  Fasting Blood Sugar - N/A Checks Blood Sugar __0___ times a day  Blood Thinner Instructions:Eliquis. Per Dr. Daneen Schick ,patient is to stop 3 days prior to surgery. Aspirin Instructions:N/A Last Dose:12/04//2020  Anesthesia review:  Chart given to Konrad Felix, PA to review.  Patient has a history of Atrial Fibrillation, chronic diastolic heart failure, Pre-Diabetes and HTN.  Patient denies shortness of breath, fever, cough and chest pain at PAT appointment   Patient verbalized understanding of instructions that were given to them at the PAT appointment. Patient was also instructed that they will need to review over the PAT instructions again at home before surgery.

## 2019-06-16 NOTE — H&P (Signed)
HPI: [name] is a 73 year-old female with persistent left renal calculi.  The problem is on the left side. This is not her first kidney stone. She has had more than 5 stones prior to getting this one. She is currently having flank pain. She denies having back pain, groin pain, nausea, vomiting, fever, and chills. She has not caught a stone in her urine strainer since her symptoms began.   She has had eswl, ureteral stent, and ureteroscopy for treatment of her stones in the past.   03/21/19: She returns after undergoing a left PCNL on 02/20/19. The procedure was somewhat hampered by the access provided by interventional Radiology through the upper pole but was placed through an infundibulum which did not allow access to a stone in the lower part of the upper pole in the calyx that was parallel to that nor did allow access to the large upper pole calculus. A small amount of stone was treated however there was an opening in the renal pelvis. I therefore left both a nephrostomy catheter as well as a Kumpe the catheter down the ureter. The patient has been bothered by irritative symptoms from the Kumpe the catheter in the bladder. She has been on suppressive antibiotics which has seemed to help somewhat. She presents today for a nephrostogram. She has undergone premedication with corticosteroids and Benadryl.   04/18/19: She returns to see me today sooner than her scheduled follow-up appointment because she feels she is not able urinate she is not having any flank pain. She has a stent in her left ureter and she said she feels like she needs to urinate and has a strong sense of urgency but which she sits down to urinate she will only void a very small amount. She is going frequently. She also has seen a blood clot pass. No fever.   04/24/19: She returns today for follow-up imaging of her left renal calculi that were treated with ureteroscopy and laser lithotripsy. There were very large. Patient is  tolerating her stent much better now. I gave her some samples of Myrbetriq 50 mg which has helped significantly. In addition she was found to have a Staph epidermidis UTI at her last visit and has been taking Cipro.   05/23/19: She returns today for follow-up of her left renal calculi treated with ureteroscopy but she had such a very large stone burden that I left a stent in place and she has been having some slight irritation from the stent both in her flank and this has resulted in some nocturia and small frequent voidings. She denies any fever or chills. She remains on Eliquis.     ALLERGIES: Augmentin - Other Reaction (Moderate) Band Aid - Other Reaction, Skin blisters Contrast Dye - Skin Rash (Moderate), Hives (Mild), Redness (Moderate to Severe) Doxycycline - Nausea Sulfa Drugs - Other Reaction, UNKNOWN    MEDICATIONS: Cardizem  Eliquis  Miralax  Montelukast Sodium  Oxycodone Hcl  Oxycodone-Acetaminophen 5 mg-325 mg tablet 1 tablet PO Q 6 H PRN  Senna-Docusate Sodium  Voltaren 1 % gel     GU PSH: Catheterization For Collection Of Specimen, Single Patient, All Places Of Service - 01/19/2019, 11/30/2018 Catheterize For Residual - 04/18/2019, 01/19/2019, 11/30/2018 Cysto Uretero Lithotripsy - 2014 Cystoscopy - 12/13/2018 ESWL - 2013, 2010, 2010 Percut Stone Removal >2cm, Left - 02/20/2019 Ureteroscopic laser litho, Left - 04/17/2019, Right - 11/22/2017       PSH Notes: back surgery- screw and rods 05/06/2018   NON-GU  PSH: Back Surgery (Unspecified) Cholecystectomy (open) - 2010 Hand/finger Surgery Revise Knee Joint - 2013 Rotator cuff surgery     GU PMH: Renal calculus (Improving), Her stone is fragmented although it is difficult to tell the largest pieces due to the shear volume of stone but she does have stone seen along the stent so I told her I wanted to give her time to passes much stone as possible and then will proceed with a 2nd look. - 04/24/2019, - 07/12/2018, - 06/16/2018,  - 06/03/2018 (Stable), She will be scheduled for right ureteroscopy and laser lithotripsy of her right renal calculi., - 2019, - 2019 (Stable), I discussed with her the fact that she has known renal calculi that are not causing obstruction or pain. Her multiple imaging studies in the hospital revealed no loss of renal parenchyma, no increase in size of the stones when compared with previous imaging studies, no evidence of obstruction and in addition she has normal renal function. We have been following these and will continue to do so as she would not be a good surgical candidate as we have discussed in the past., - 2018, Renal calculus, bilateral, - 2017, Staghorn calculus, - 2017 Urinary Urgency, She is experiencing a sense of urgency and interpret this as a full bladder but in fact she has very little in her bladder and then voids only a small amount. She was found to have only 102 cc in her bladder today. I am going to start her on some Myrbetriq which should help. I have reassured her it is due to the presence of her stent and possibly secondarily to the passage of stone fragments beside the stent. - 04/18/2019 History of urolithiasis (Stable), She is now stone free. I will have her return in 6 months for a KUB. - 11/30/2017 Gross hematuria - 2019 Flank Pain (Worsening, Chronic), Right, Culture urine. No ABX required at this time unless culture proven UTi. Ketorolac 30 mg IM today. No acute stone noted along right ureter. Stable bilateral renal stones. Will refill Oxycodone at this time and she understands if pain persists or worsens will need CT urogram. - 06/17/2017 Mixed incontinence, Urge and stress incontinence - 2014 Ureteral calculus, Proximal Ureteral Stone On The Right - 2014      PMH Notes: She has a history of medullary sponge kidney and kidney stones and has undergone lithotripsy twice in the past for stones on the right-hand side. She has also required ureteroscopy for residual fragments  after her first lithotripsy. On a CT scan done in 1/10 she had no right renal calculi and a 2.4 x 1.2 cm stone in the upper portion of her right kidney without evidence of obstruction. Hounsfield units were ~800-900. A KUB in 9/12 revealed the stone had increased in size to 3 x 3 cm.  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  Right UPJ stone treated with ESL in 11/13  24 hour urine: She was found to have no significant abnormality.  Stone analysis: calcium oxalate and calcium phosphate.    Mixed urinary incontinence: She has mild incontinence with coughing and sneezing but occasionally will also have significant urgency and what sounds like urge incontinence with associated nocturia 2-3 times     NON-GU PMH: Pyuria/other UA findings, She had pyuria and bacteriuria today and her urine was nitrite positive so her urine will be cultured and she will  be placed on antibiotics. - 03/21/2019, (Stable), At the time of cystoscopy I obtained a urine specimen which will be sent for culture again today., - 12/13/2018, She did have some pyuria and bacteriuria today. I will culture her urine., - 11/30/2017 Bacteriuria (Stable), She appears to have bacteriuria but has no symptoms other than malodorous urine. We discussed the fact that this is not an indication for antibiotic therapy and I would not recommend anyone placing her on antibiotics based on her urinalysis alone if she is asymptomatic. - 10/04/2018 Encounter for general adult medical examination without abnormal findings, Encounter for preventive health examination - 2017 Anxiety, Anxiety - 2014 Medullary cystic kidney, Congenital medullary sponge kidney - 2014 Atrial Fibrillation    FAMILY HISTORY: Death In The Family Father - Father Death In The Family Mother - Father Diabetes - Mother Family Health Status Number - Father Heart Attack - Brother Heart Disease  - Mother Lung Cancer - Mother, Father   SOCIAL HISTORY: Marital Status: Married Preferred Language: English; Ethnicity: Not Hispanic Or Latino; Race: White Current Smoking Status: Patient has never smoked.   Tobacco Use Assessment Completed: Used Tobacco in last 30 days? Does not use smokeless tobacco. Has never drank.  Does not use drugs. Drinks 2 caffeinated drinks per day.    REVIEW OF SYSTEMS:    GU Review Female:   Patient denies frequent urination, hard to postpone urination, burning /pain with urination, get up at night to urinate, leakage of urine, stream starts and stops, trouble starting your stream, have to strain to urinate, and being pregnant.  Gastrointestinal (Upper):   Patient denies nausea, vomiting, and indigestion/ heartburn.  Gastrointestinal (Lower):   Patient denies diarrhea and constipation.  Constitutional:   Patient denies fever, night sweats, weight loss, and fatigue.  Skin:   Patient denies skin rash/ lesion and itching.  Eyes:   Patient denies double vision and blurred vision.  Ears/ Nose/ Throat:   Patient denies sore throat and sinus problems.  Hematologic/Lymphatic:   Patient denies swollen glands and easy bruising.  Cardiovascular:   Patient denies leg swelling and chest pains.  Respiratory:   Patient denies cough and shortness of breath.  Endocrine:   Patient denies excessive thirst.  Musculoskeletal:   Patient denies back pain and joint pain.  Neurological:   Patient denies headaches and dizziness.  Psychologic:   Patient denies depression and anxiety.   VITAL SIGNS:    Weight 220 lb / 99.79 kg  Height 62 in / 157.48 cm  BP 155/77 mmHg  Pulse 81 /min  BMI 40.2 kg/m   GU PHYSICAL EXAMINATION:    External Genitalia: No hirsutism, no rash, no scarring, no cyst, no erythematous lesion, no papular lesion, no blanched lesion, no warty lesion. No edema.  Urethral Meatus: Normal size. Normal position. No discharge.  Urethra: No tenderness, no mass,  no scarring. No hypermobility. No leakage.  Vagina: No atrophy, no stenosis. No rectocele. No cystocele. No enterocele.   MULTI-SYSTEM PHYSICAL EXAMINATION:    Constitutional: Well-nourished. No physical deformities. Normally developed. Good grooming.  Neck: Neck symmetrical, not swollen. Normal tracheal position.  Respiratory: No labored breathing, no use of accessory muscles.   Cardiovascular: Normal temperature, normal extremity pulses, no swelling, no varicosities.  Lymphatic: No enlargement of neck, axillae, groin.  Skin: No paleness, no jaundice, no cyanosis. No lesion, no ulcer, no rash.  Neurologic / Psychiatric: Oriented to time, oriented to place, oriented to person. No depression, no anxiety, no agitation.  Gastrointestinal: Obese  abdomen. No mass, no tenderness, no rigidity.   Eyes: Normal conjunctivae. Normal eyelids.  Ears, Nose, Mouth, and Throat: Left ear no scars, no lesions, no masses. Right ear no scars, no lesions, no masses. Nose no scars, no lesions, no masses. Normal hearing. Normal lips.  Musculoskeletal: Normal gait and station of head and neck.   PAST DATA REVIEWED:  Source Of History:  Patient  Records Review:   Previous Patient Records, POC Tool  X-Ray Review: KUB: Reviewed Films. Previous KUB images were reviewed and compared with today's study.   ASSESSMENT/PLAN:     ICD-10 Details  1 GU:   Gross hematuria - R31.0 Stable - We discussed the fact that her gross hematuria is secondary to the presence of the stones and her stent and the fact that she is on Eliquis.  2   Renal calculus - N20.0 Left, Stable - She still has a significant stone burden although it appears quite fragmented but I am going to try and fragment these pieces further in order to allow as much stone to pass spontaneously. We discussed repeat ureteroscopy and laser lithotripsy. She will be scheduled for this and stop taking her Eliquis 3 days prior to the surgery.  3 NON-GU:   Bacteriuria -  R82.71 Worsening - She is having a lot of frequency and urgency which I think is due to the presence of a stent but her urine culture proved positive for E coli and it was sensitive to Keflex.  She was placed on this.

## 2019-06-16 NOTE — Progress Notes (Signed)
Anesthesia Chart Review   Case: [id number] Date/Time: 06/19/19 0830   Procedure: URETEROSCOPY WITH HOLMIUM LASER LITHOTRIPSY (Left )   Anesthesia type: General   Pre-op diagnosis: LEFT RENAL CALCULI   Location: [facility] 04 / [facility]   Surgeon: Kathie Rhodes, MD      DISCUSSION:73 y.o. never smoker with h/o Atrial Fibrillation, chronic diastolic heart failure, pre-diabetes, left renal calculi scheduled for above procedure 06/19/2019 with Dr. Kathie Rhodes.   Cleared by cardiology 06/02/2019.  Per Almyra Deforest, PA-C, "[name] was last seen on 08/23/2018 by Dr. Tamala Julian.  Since that day, [name] has done well without chest pain or shortness of breath. Therefore, based on ACC/AHA guidelines, the patient would be at acceptable risk for the planned procedure without further cardiovascular testing.  I will route this recommendation to the requesting party via Epic fax function and remove from pre-op pool. Please call with questions. See below for recommendation by our clinical pharmacist regarding how long to hold eliquis. We recommend to restart eliquis as early as possible after the procedure at the discretion of surgeon based on bleeding risk."  Advised to hold Eliquis 2 days prior to procedure.   Anticipate pt can proceed with planned procedure barring acute status change.   VS: BP (!) 170/74   Pulse 64   Temp 37.2 C (Oral)   Resp 18   Ht 5\' 2"  (1.575 m)   Wt 105.7 kg   SpO2 99%   BMI 42.62 kg/m   PROVIDERS: Jani Gravel, MD is PCP   Daneen Schick, MD is Cardiologist  LABS: Labs reviewed: Acceptable for surgery. (all labs ordered are listed, but only abnormal results are displayed)  Labs Reviewed  CBC - Abnormal; Notable for the following components:      Result Value   Hemoglobin 11.3 (*)    All other components within normal limits  BASIC METABOLIC PANEL - Abnormal; Notable for the following components:   Glucose, Bld 106 (*)    All other components within normal limits   HEMOGLOBIN A1C - Abnormal; Notable for the following components:   Hgb A1c MFr Bld 5.9 (*)    All other components within normal limits     IMAGES:   EKG: 06/15/2019 Rate 63 bpm Sinus rhythm with 1st degree A-V block Possible Anterior infarct , age undetermined versus lead placement Abnormal ECG When compared with ECG of 11-May-2018 Poor R wave progression is now Present PR interval has increased  CV: Echo 10/25/2016 Study Conclusions  - Left ventricle: The cavity size was normal. There was mild   concentric hypertrophy. Systolic function was normal. The   estimated ejection fraction was in the range of 55% to 60%. Wall   motion was normal; there were no regional wall motion   abnormalities. Doppler parameters are consistent with abnormal   left ventricular relaxation (grade 1 diastolic dysfunction). - Mitral valve: Valve area by pressure half-time: 1.76 cm^2. - Left atrium: The atrium was mildly to moderately dilated.  Cardiac Cath 01/16/2015  Normal coronary arteries  Normal left ventricular systolic function  False positive myocardial perfusion study   RECOMMENDATIONS:  No further ischemic evaluation is necessary Past Medical History:  Diagnosis Date  . Acute hypoxemic respiratory failure ([place]) 10/24/2016  . Arthritis    both knees  . Atrial fibrillation, chronic ([place])   . Carpal tunnel syndrome, bilateral    both hands go to sleep at times from elbows down  . Chronic diastolic heart failure ([place])   .  DDD (degenerative disc disease), lumbosacral   . Dyspnea    during chronic bronchitis, resolved at this time  . First degree AV block   . Hematuria 11/15/2017  . History of kidney stones   . History of rib fracture 03/2011  . Lumbar stenosis   . Obesity   . Postlaminectomy syndrome   . Pre-diabetes   . Renal calculi    BILATERAL  . Right ureteral stone   . Rotator cuff tear, left   . Rotator cuff tear, right   . UTI (urinary tract infection)      Past Surgical History:  Procedure Laterality Date  . ANTERIOR CERVICAL DECOMP/DISCECTOMY FUSION  03-18-2011   C3 -- C7  . ANTERIOR LATERAL LUMBAR FUSION 4 LEVELS N/A 05/06/2018   Procedure: Lumbar one- two Lumbar two -three Lumbar  three-four Lumbar four-five Anterolateral decompression/fusion/posterior bilateral laminectomy Lumbar four-five Lumbar five Sacral one/posterior percutaneous fixation/mazor;  Surgeon: Kristeen Miss, MD;  Location: [place];  Service: Neurosurgery;  Laterality: N/A;  . APPENDECTOMY    . APPLICATION OF ROBOTIC ASSISTANCE FOR SPINAL PROCEDURE N/A 05/06/2018   Procedure: APPLICATION OF ROBOTIC ASSISTANCE FOR SPINAL PROCEDURE;  Surgeon: Kristeen Miss, MD;  Location: [place];  Service: Neurosurgery;  Laterality: N/A;  . BILATERAL CARPAL TUNNEL RELEASE     3 screws in right hand, thumb, middle finger, and index finger,   . CARDIAC CATHETERIZATION N/A 01/16/2015   Procedure: Left Heart Cath and Coronary Angiography;  Surgeon: Belva Crome, MD;  Location: [place] [facility];  Service: Cardiovascular;  Laterality: N/A;  . CHOLECYSTECTOMY OPEN  1970'S  . COLONOSCOPY  2018  . CYSTOSCOPY WITH RETROGRADE PYELOGRAM, URETEROSCOPY AND STENT PLACEMENT N/A 02/13/2013   Procedure: CYSTOSCOPY WITH RETROGRADE PYELOGRAM, URETEROSCOPY AND LITHOTRIPSY ;  Surgeon: Claybon Jabs, MD;  Location: [facility];  Service: Urology;  Laterality: N/A;  . CYSTOSCOPY WITH URETEROSCOPY AND STENT PLACEMENT Left 04/17/2019   Procedure: CYSTOSCOPY WITH LEFT  RETROGRADE PYELOGRAM, LEFT URETEROSCOPY, HOLMIUM LASER AND LEFT STENT PLACEMENT;  Surgeon: Kathie Rhodes, MD;  Location: [facility];  Service: Urology;  Laterality: Left;  . CYSTOSCOPY/URETEROSCOPY/HOLMIUM LASER/STENT PLACEMENT Right 11/22/2017   Procedure: CYSTOSCOPY/URETEROSCOPY/RETROGRADE PYELOGRAM/HOLMIUM LASER/STENT PLACEMENT;  Surgeon: Kathie Rhodes, MD;  Location: [facility];  Service: Urology;  Laterality: Right;  .  EXTRACORPOREAL SHOCK WAVE LITHOTRIPSY Right 05-16-2012  . FINGER ARTHROSCOPY WITH CARPOMETACARPEL ([facility]) ARTHROPLASTY    . HOLMIUM LASER APPLICATION Right [id number]   Procedure: HOLMIUM LASER APPLICATION;  Surgeon: Claybon Jabs, MD;  Location: [facility];  Service: Urology;  Laterality: Right;  . IR URETERAL STENT LEFT NEW ACCESS W/O SEP NEPHROSTOMY CATH  02/13/2019  . KNEE ARTHROSCOPY Bilateral   . LUMBAR LAMINECTOMY  X2  1960'S  . LUMBAR PERCUTANEOUS PEDICLE SCREW 4 LEVEL N/A 05/06/2018   Procedure: LUMBAR PERCUTANEOUS PEDICLE SCREW LUMBAR ONE TO LUMBAR FIVE WITH MAZOR ;POSTERIOR LATERAL LAMINECTOMY LUMBAR FOUR-FIVE,FIVE-SACRAL ONE;  Surgeon: Kristeen Miss, MD;  Location: [place];  Service: Neurosurgery;  Laterality: N/A;  . NASAL SEPTUM SURGERY  1970'S  . NEPHROLITHOTOMY Left 02/20/2019   Procedure: NEPHROLITHOTOMY PERCUTANEOUS;  Surgeon: Kathie Rhodes, MD;  Location[facility];  Service: Urology;  Laterality: Left;  . SHOULDER OPEN ROTATOR CUFF REPAIR Bilateral LEFT 01-11-2001/   RIGHT 07-21-2002  . TOTAL KNEE ARTHROPLASTY  04/13/2012   Procedure: TOTAL KNEE ARTHROPLASTY;  Surgeon: Gearlean Alf, MD;  Location: [facility];  Service: Orthopedics;  Laterality: Left;  . TOTAL KNEE ARTHROPLASTY  08/08/2012  Procedure: TOTAL KNEE ARTHROPLASTY;  Surgeon: Gearlean Alf, MD;  [facility];  Service: Orthopedics;  Laterality: Right;  . TRANSTHORACIC ECHOCARDIOGRAM  03-29-2011   MODERATE LVH/ EF 60-65%/ MILD MR  . VAGINAL HYSTERECTOMY  1960'S    MEDICATIONS: . cephALEXin (KEFLEX) 500 MG capsule  . acetaminophen (TYLENOL) 650 MG CR tablet  . apixaban (ELIQUIS) 5 MG TABS tablet  . diclofenac sodium (VOLTAREN) 1 % GEL  . diltiazem (CARTIA XT) 120 MG 24 hr capsule  . montelukast (SINGULAIR) 10 MG tablet  . Oxycodone HCl 10 MG TABS  . Oxycodone HCl 10 MG TABS  . Polyethyl Glycol-Propyl Glycol (SYSTANE) 0.4-0.3 % SOLN   No current facility-administered medications for this encounter.      Maia Plan [facility] Pre-Surgical Testing [phone] 06/16/19  10:25 AM

## 2019-06-16 NOTE — Discharge Instructions (Signed)

## 2019-06-16 NOTE — Anesthesia Preprocedure Evaluation (Addendum)
Anesthesia Evaluation  Patient identified by MRN, date of birth, ID band Patient awake    Reviewed: Allergy & Precautions, NPO status , Patient's Chart, lab work & pertinent test results  Airway Mallampati: II  TM Distance: >3 FB Neck ROM: Limited    Dental  (+) Teeth Intact, Dental Advisory Given   Pulmonary    breath sounds clear to auscultation       Cardiovascular hypertension, +CHF  + dysrhythmias Atrial Fibrillation  Rhythm:Regular Rate:Normal     Neuro/Psych Anxiety  Neuromuscular disease    GI/Hepatic negative GI ROS, Neg liver ROS,   Endo/Other  negative endocrine ROS  Renal/GU      Musculoskeletal  (+) Arthritis ,   Abdominal (+) + obese,   Peds  Hematology   Anesthesia Other Findings   Reproductive/Obstetrics                                                              Anesthesia Evaluation  Patient identified by MRN, date of birth, ID band Patient awake    Reviewed: Allergy & Precautions, NPO status , Patient's Chart, lab work & pertinent test results  Airway Mallampati: II  TM Distance: >3 FB Neck ROM: Full    Dental  (+) Teeth Intact, Dental Advisory Given   Pulmonary neg pulmonary ROS,    breath sounds clear to auscultation       Cardiovascular hypertension, + dysrhythmias Atrial Fibrillation  Rhythm:Regular Rate:Normal     Neuro/Psych Anxiety  Neuromuscular disease    GI/Hepatic negative GI ROS, Neg liver ROS,   Endo/Other  negative endocrine ROS  Renal/GU negative Renal ROS     Musculoskeletal  (+) Arthritis , Osteoarthritis,    Abdominal (+) + obese,   Peds  Hematology negative hematology ROS (+)   Anesthesia Other Findings   Reproductive/Obstetrics                            Lab Results  Component Value Date   WBC 7.6 06/15/2019   HGB 11.3 (L) 06/15/2019   HCT 36.6 06/15/2019   MCV 89.9 06/15/2019   PLT 282 06/15/2019   Lab Results  Component Value Date   CREATININE 0.74 06/15/2019   BUN 18 06/15/2019   NA 140 06/15/2019   K 4.2 06/15/2019   CL 105 06/15/2019   CO2 23 06/15/2019   Lab Results  Component Value Date   INR 1.2 02/20/2019   INR 0.9 02/13/2019   INR 1.26 04/29/2018   EKG: normal sinus rhythm, 1st degree AV block.  Anesthesia Physical Anesthesia Plan  ASA: III  Anesthesia Plan: General   Post-op Pain Management:    Induction: Intravenous  PONV Risk Score and Plan: 4 or greater and Ondansetron, Dexamethasone and Midazolam  Airway Management Planned: Oral ETT  Additional Equipment: Arterial line  Intra-op Plan:   Post-operative Plan: Extubation in OR  Informed Consent: I have reviewed the patients History and Physical, chart, labs and discussed the procedure including the risks, benefits and alternatives for the proposed anesthesia with the patient or authorized representative who has indicated his/her understanding and acceptance.   Dental advisory given  Plan Discussed with: CRNA  Anesthesia Plan Comments:        Anesthesia Quick  Evaluation  Echo (2018):  - Left ventricle: The cavity size was normal. There was mild   concentric hypertrophy. Systolic function was normal. The   estimated ejection fraction was in the range of 55% to 60%. Wall   motion was normal; there were no regional wall motion   abnormalities. Doppler parameters are consistent with abnormal   left ventricular relaxation (grade 1 diastolic dysfunction). - Mitral valve: Valve area by pressure half-time: 1.76 cm^2. - Left atrium: The atrium was mildly to moderately dilated.  Anesthesia Physical Anesthesia Plan  ASA: III  Anesthesia Plan: General   Post-op Pain Management:    Induction: Intravenous  PONV Risk Score and Plan: 4 or greater and Ondansetron and Treatment may vary due to age or medical condition  Airway Management Planned: LMA  Additional  Equipment: None  Intra-op Plan:   Post-operative Plan: Extubation in OR  Informed Consent: I have reviewed the patients History and Physical, chart, labs and discussed the procedure including the risks, benefits and alternatives for the proposed anesthesia with the patient or authorized representative who has indicated his/her understanding and acceptance.     Dental advisory given  Plan Discussed with: CRNA  Anesthesia Plan Comments: (See PAT note 06/15/2019, Konrad Felix, PA-C)      Anesthesia Quick Evaluation

## 2019-06-18 LAB — NOVEL CORONAVIRUS, NAA (HOSP ORDER, SEND-OUT TO REF LAB; TAT 18-24 HRS): SARS-CoV-2, NAA: NOT DETECTED

## 2019-06-19 ENCOUNTER — Ambulatory Visit (HOSPITAL_COMMUNITY): Payer: PPO | Admitting: Anesthesiology

## 2019-06-19 ENCOUNTER — Other Ambulatory Visit: Payer: Self-pay

## 2019-06-19 ENCOUNTER — Ambulatory Visit (HOSPITAL_COMMUNITY)
Admission: RE | Admit: 2019-06-19 | Discharge: 2019-06-19 | Disposition: A | Discharge status: Home / Self Care | Payer: PPO | Attending: Urology | Admitting: Urology

## 2019-06-19 ENCOUNTER — Encounter (HOSPITAL_COMMUNITY)
Admission: RE | Disposition: A | Discharge status: Home / Self Care | Payer: Self-pay | Source: Home / Self Care | Attending: Urology

## 2019-06-19 ENCOUNTER — Ambulatory Visit (HOSPITAL_COMMUNITY): Payer: PPO | Admitting: Physician Assistant

## 2019-06-19 ENCOUNTER — Ambulatory Visit (HOSPITAL_COMMUNITY): Payer: PPO

## 2019-06-19 ENCOUNTER — Encounter (HOSPITAL_COMMUNITY): Payer: Self-pay

## 2019-06-19 DIAGNOSIS — Z01818 Encounter for other preprocedural examination: Secondary | ICD-10-CM | POA: Diagnosis not present

## 2019-06-19 DIAGNOSIS — Z7901 Long term (current) use of anticoagulants: Secondary | ICD-10-CM | POA: Diagnosis not present

## 2019-06-19 DIAGNOSIS — I110 Hypertensive heart disease with heart failure: Secondary | ICD-10-CM | POA: Diagnosis not present

## 2019-06-19 DIAGNOSIS — I509 Heart failure, unspecified: Secondary | ICD-10-CM | POA: Diagnosis not present

## 2019-06-19 DIAGNOSIS — I480 Paroxysmal atrial fibrillation: Secondary | ICD-10-CM | POA: Diagnosis not present

## 2019-06-19 DIAGNOSIS — N202 Calculus of kidney with calculus of ureter: Secondary | ICD-10-CM | POA: Diagnosis not present

## 2019-06-19 DIAGNOSIS — I4891 Unspecified atrial fibrillation: Secondary | ICD-10-CM | POA: Diagnosis not present

## 2019-06-19 DIAGNOSIS — Z79899 Other long term (current) drug therapy: Secondary | ICD-10-CM | POA: Insufficient documentation

## 2019-06-19 DIAGNOSIS — N200 Calculus of kidney: Secondary | ICD-10-CM

## 2019-06-19 DIAGNOSIS — N201 Calculus of ureter: Secondary | ICD-10-CM | POA: Diagnosis not present

## 2019-06-19 DIAGNOSIS — I5032 Chronic diastolic (congestive) heart failure: Secondary | ICD-10-CM | POA: Diagnosis not present

## 2019-06-19 HISTORY — PX: URETEROSCOPY WITH HOLMIUM LASER LITHOTRIPSY: SHX6645

## 2019-06-19 SURGERY — URETEROSCOPY, WITH LITHOTRIPSY USING HOLMIUM LASER
Anesthesia: General | Site: Urethra | Laterality: Left

## 2019-06-19 MED ORDER — FENTANYL CITRATE (PF) 100 MCG/2ML IJ SOLN
INTRAMUSCULAR | Status: DC | PRN
  Administered 2019-06-19: 50 ug via INTRAVENOUS

## 2019-06-19 MED ORDER — ACETAMINOPHEN 160 MG/5ML PO SOLN: 325.0000 mg | Freq: Once | ORAL | Status: DC | PRN

## 2019-06-19 MED ORDER — LIDOCAINE 2% (20 MG/ML) 5 ML SYRINGE
INTRAMUSCULAR | Status: AC
  Filled 2019-06-19: qty 5

## 2019-06-19 MED ORDER — FENTANYL CITRATE (PF) 100 MCG/2ML IJ SOLN
INTRAMUSCULAR | Status: AC
  Filled 2019-06-19: qty 2

## 2019-06-19 MED ORDER — LACTATED RINGERS IV SOLN: INTRAVENOUS | Status: DC

## 2019-06-19 MED ORDER — SODIUM CHLORIDE 0.9 % IR SOLN
Status: DC | PRN
  Administered 2019-06-19: 6000 mL via INTRAVESICAL

## 2019-06-19 MED ORDER — OXYCODONE HCL 5 MG/5ML PO SOLN: 5.0000 mg | Freq: Once | ORAL | Status: DC | PRN

## 2019-06-19 MED ORDER — PROMETHAZINE HCL 25 MG/ML IJ SOLN: 6.2500 mg | INTRAMUSCULAR | Status: DC | PRN

## 2019-06-19 MED ORDER — 0.9 % SODIUM CHLORIDE (POUR BTL) OPTIME
TOPICAL | Status: DC | PRN
  Administered 2019-06-19: 1000 mL

## 2019-06-19 MED ORDER — LIDOCAINE HCL (CARDIAC) PF 100 MG/5ML IV SOSY
PREFILLED_SYRINGE | INTRAVENOUS | Status: DC | PRN
  Administered 2019-06-19: 40 mg via INTRAVENOUS

## 2019-06-19 MED ORDER — FENTANYL CITRATE (PF) 100 MCG/2ML IJ SOLN
25.0000 ug | INTRAMUSCULAR | Status: DC | PRN
  Administered 2019-06-19 (×2): 50 ug via INTRAVENOUS

## 2019-06-19 MED ORDER — ACETAMINOPHEN 10 MG/ML IV SOLN: 1000.0000 mg | Freq: Once | INTRAVENOUS | Status: DC | PRN

## 2019-06-19 MED ORDER — OXYCODONE HCL 5 MG PO TABS: 5.0000 mg | ORAL_TABLET | Freq: Once | ORAL | Status: DC | PRN

## 2019-06-19 MED ORDER — ONDANSETRON HCL 4 MG/2ML IJ SOLN
INTRAMUSCULAR | Status: AC
  Filled 2019-06-19: qty 2

## 2019-06-19 MED ORDER — CIPROFLOXACIN IN D5W 400 MG/200ML IV SOLN
400.0000 mg | Freq: Once | INTRAVENOUS | Status: AC
  Administered 2019-06-19: 400 mg via INTRAVENOUS
  Filled 2019-06-19: qty 200

## 2019-06-19 MED ORDER — LACTATED RINGERS IV SOLN
INTRAVENOUS | Status: DC
  Administered 2019-06-19: 08:00:00 via INTRAVENOUS

## 2019-06-19 MED ORDER — PHENAZOPYRIDINE HCL 200 MG PO TABS: 200.0000 mg | ORAL_TABLET | Freq: Three times a day (TID) | ORAL | 0 refills | Status: DC | PRN

## 2019-06-19 MED ORDER — ACETAMINOPHEN 325 MG PO TABS: 325.0000 mg | ORAL_TABLET | Freq: Once | ORAL | Status: DC | PRN

## 2019-06-19 MED ORDER — PROPOFOL 10 MG/ML IV BOLUS
INTRAVENOUS | Status: AC
  Filled 2019-06-19: qty 20

## 2019-06-19 MED ORDER — HYDROCODONE-ACETAMINOPHEN 10-325 MG PO TABS: 1.0000 | ORAL_TABLET | ORAL | 0 refills | Status: DC | PRN

## 2019-06-19 MED ORDER — ONDANSETRON HCL 4 MG/2ML IJ SOLN
INTRAMUSCULAR | Status: DC | PRN
  Administered 2019-06-19: 4 mg via INTRAVENOUS

## 2019-06-19 MED ORDER — PROPOFOL 10 MG/ML IV BOLUS
INTRAVENOUS | Status: DC | PRN
  Administered 2019-06-19: 150 mg via INTRAVENOUS
  Administered 2019-06-19: 50 mg via INTRAVENOUS

## 2019-06-19 SURGICAL SUPPLY — 24 items
BAG URO CATCHER STRL LF (MISCELLANEOUS) ×2 IMPLANT
BASKET LASER NITINOL 1.9FR (BASKET) ×4 IMPLANT
BASKET ZERO TIP NITINOL 2.4FR (BASKET) ×2 IMPLANT
CATH INTERMIT  6FR 70CM (CATHETERS) ×2 IMPLANT
CATH URET DUAL LUMEN 6-10FR 50 (CATHETERS) ×2 IMPLANT
CLOTH BEACON ORANGE TIMEOUT ST (SAFETY) ×2 IMPLANT
EXTRACTOR STONE 1.7FRX115CM (UROLOGICAL SUPPLIES) IMPLANT
FIBER LASER FLEXIVA 365 (UROLOGICAL SUPPLIES) IMPLANT
FIBER LASER TRAC TIP (UROLOGICAL SUPPLIES) ×2 IMPLANT
GLOVE BIOGEL M 8.0 STRL (GLOVE) ×2 IMPLANT
GOWN STRL REUS W/TWL XL LVL3 (GOWN DISPOSABLE) ×2 IMPLANT
GUIDEWIRE ANG ZIPWIRE 038X150 (WIRE) IMPLANT
GUIDEWIRE STR DUAL SENSOR (WIRE) ×2 IMPLANT
IV NS 1000ML (IV SOLUTION) ×1
IV NS 1000ML BAXH (IV SOLUTION) ×1 IMPLANT
KIT TURNOVER KIT A (KITS) IMPLANT
MANIFOLD NEPTUNE II (INSTRUMENTS) ×2 IMPLANT
PACK CYSTO (CUSTOM PROCEDURE TRAY) ×2 IMPLANT
PENCIL SMOKE EVACUATOR (MISCELLANEOUS) IMPLANT
SHEATH URETERAL 12FRX28CM (UROLOGICAL SUPPLIES) IMPLANT
SHEATH URETERAL 12FRX35CM (MISCELLANEOUS) ×2 IMPLANT
STENT URET 6FRX24 CONTOUR (STENTS) ×2 IMPLANT
TUBING CONNECTING 10 (TUBING) ×2 IMPLANT
TUBING UROLOGY SET (TUBING) ×2 IMPLANT

## 2019-06-19 NOTE — Op Note (Signed)
PATIENT:  [name]  PRE-OPERATIVE DIAGNOSIS: 1.  left renal calculi 2.  Left ureteral calculi   POST-OPERATIVE DIAGNOSIS: Same  PROCEDURE:  1.  Cystoscopy with left ureteral stent removal. 2.  Left ureteroscopy and extraction of ureteral stones. 3.  Left ureteroscopy and removal of the renal calculi. 4.  Laser lithotripsy of left renal calculi. 5.  Left double-J stent placement  SURGEON: Claybon Jabs, MD  INDICATION: Mrs. Newlon is a 73 year old female who had a large stone burden in her left kidney.  An initial attempt at percutaneous nephrolithotomy was unsuccessful due to the location of the access.  I therefore discussed this with her and she elected to undergo ureteroscopic management.  I therefore treated her with laser lithotripsy and felt I had her stones completely fragmented.  Over time she passed multiple fragments and had stones seen along the course of her ureter but continued to have persistent left renal calculi so we discussed repeat ureteroscopy and attempt to remove the remaining calculi.  ANESTHESIA:  General  EBL:  Minimal  DRAINS: 6 French, 24 cm double-J stent (no string)  SPECIMEN: Stone given to patient  DESCRIPTION OF PROCEDURE: The patient was taken to the major OR and placed on the table. General anesthesia was administered and then the patient was moved to the dorsal lithotomy position. The genitalia was sterilely prepped and draped. An official timeout was performed.  Initially the 29 French cystoscope with 30 lens was passed under direct vision into the bladder. The bladder was then fully inspected. It was noted be free of any tumors, stones or inflammatory lesions. Ureteral orifices were of normal configuration and position.  The stent previously placed was identified  the exiting left ureteral orifice.  This was grasped with alligator forceps and a 0.038 inch sensor guidewire was then passed through the stent into the area of the renal pelvis  under fluoroscopy.  I then passed a double-lumen ureteral catheter over the guidewire and a second safety guidewire was passed.  This was left in place.  I then proceeded with rigid ureteroscopy.  A 6 French rigid ureteroscope was then passed under direct vision into the left ureter and multiple stones were identified over seen on a preop KUB.  These were grasped with a 0 tip nitinol basket and extracted.  I then passed a ureteral access sheath over the working guidewire and removed the inner portion of the access sheath as well as the guidewire.  A 6 French flexible, digital ureteroscope was then passed through the access sheath and into the area of the renal pelvis.  I advanced the scope into the upper pole calyx and identified multiple stone fragments which were then grasped with a 0 tip nitinol basket and extracted without difficulty.  Some of the stones were too large to clean through the access sheath but I was able to pull them up against the access sheath and removed the access sheath and stone without any resistance each time passing the dual-lumen ureteral catheter over the guidewire and placing a second guidewire to use in order to pass the access sheath again into the area of the renal pelvis and proceed with further stone extraction.  I was able to clear out all visible stone from the most superior portion of the upper pole calyceal system.  There appeared to be some stone in the lateral calyx and I was able to access this and identified the stone but was unable to engage it within the basket.  I used the 200 m holmium laser fiber to fragment the stone and remove further fragments.  There were some remaining fragments and that calyx but they were at the location that flexion of the scope was inadequate to visualize the stone for grasping or with laser.    I therefore removed the ureteroscope and I then backloaded the cystoscope over the safety guidewire and passed the stent over the guidewire  into the area of the renal pelvis. As the guidewire was removed good curl was noted in the renal pelvis. The bladder was drained and the cystoscope was then removed. The patient tolerated the procedure well no intraoperative complications.  PLAN OF CARE: Discharge to home after PACU  PATIENT DISPOSITION:  PACU - hemodynamically stable.

## 2019-06-19 NOTE — Transfer of Care (Signed)
Immediate Anesthesia Transfer of Care Note  Patient: [name]  Procedure(s) Performed: URETEROSCOPY WITH HOLMIUM LASER LITHOTRIPSY/INSERTION OF DOUBLE J STENT (Left Urethra)  Patient Location: PACU  Anesthesia Type:General  Level of Consciousness: drowsy, patient cooperative and responds to stimulation  Airway & Oxygen Therapy: Patient Spontanous Breathing and Patient connected to face mask oxygen  Post-op Assessment: Report given to RN and Post -op Vital signs reviewed and stable  Post vital signs: Reviewed and stable  Last Vitals:  Vitals Value Taken Time  BP 152/65 06/19/19 1155  Temp    Pulse 65 06/19/19 1156  Resp    SpO2 100 % 06/19/19 1156  Vitals shown include unvalidated device data.  Last Pain:  Vitals:   06/19/19 0726  TempSrc:   PainSc: 0-No pain         Complications: No apparent anesthesia complications

## 2019-06-19 NOTE — Anesthesia Procedure Notes (Signed)
Procedure Name: LMA Insertion Date/Time: 06/19/2019 9:16 AM Performed by: Glory Buff, CRNA Pre-anesthesia Checklist: Patient identified, Emergency Drugs available, Suction available and Patient being monitored Patient Re-evaluated:Patient Re-evaluated prior to induction Oxygen Delivery Method: Circle system utilized Preoxygenation: Pre-oxygenation with 100% oxygen Induction Type: IV induction Ventilation: Mask ventilation without difficulty LMA: LMA inserted LMA Size: 4.0 Number of attempts: 1 Placement Confirmation: CO2 detector Tube secured with: Tape Dental Injury: Teeth and Oropharynx as per pre-operative assessment

## 2019-06-20 ENCOUNTER — Encounter (HOSPITAL_COMMUNITY): Payer: Self-pay | Admitting: Urology

## 2019-06-20 DIAGNOSIS — Z79891 Long term (current) use of opiate analgesic: Secondary | ICD-10-CM | POA: Diagnosis not present

## 2019-06-20 DIAGNOSIS — M961 Postlaminectomy syndrome, not elsewhere classified: Secondary | ICD-10-CM | POA: Diagnosis not present

## 2019-06-20 NOTE — Anesthesia Postprocedure Evaluation (Signed)
Anesthesia Post Note  Patient: [name]  Procedure(s) Performed: URETEROSCOPY WITH HOLMIUM LASER LITHOTRIPSY/INSERTION OF DOUBLE J STENT (Left Urethra)     Patient location during evaluation: PACU Anesthesia Type: General Level of consciousness: awake and alert Pain management: pain level controlled Vital Signs Assessment: post-procedure vital signs reviewed and stable Respiratory status: spontaneous breathing, nonlabored ventilation, respiratory function stable and patient connected to nasal cannula oxygen Cardiovascular status: blood pressure returned to baseline and stable Postop Assessment: no apparent nausea or vomiting Anesthetic complications: no    Last Vitals:  Vitals:   06/19/19 1245 06/19/19 1300  BP: 133/67 (!) 151/70  Pulse: (!) 59 (!) 59  Resp: 15 13  Temp:    SpO2: 100% 100%    Last Pain:  Vitals:   06/19/19 1300  TempSrc:   PainSc: [name]

## 2019-06-30 DIAGNOSIS — E118 Type 2 diabetes mellitus with unspecified complications: Secondary | ICD-10-CM | POA: Diagnosis not present

## 2019-06-30 DIAGNOSIS — Z0000 Encounter for general adult medical examination without abnormal findings: Secondary | ICD-10-CM | POA: Diagnosis not present

## 2019-06-30 DIAGNOSIS — Z780 Asymptomatic menopausal state: Secondary | ICD-10-CM | POA: Diagnosis not present

## 2019-06-30 DIAGNOSIS — E1142 Type 2 diabetes mellitus with diabetic polyneuropathy: Secondary | ICD-10-CM | POA: Diagnosis not present

## 2019-07-03 DIAGNOSIS — R8279 Other abnormal findings on microbiological examination of urine: Secondary | ICD-10-CM | POA: Diagnosis not present

## 2019-07-03 DIAGNOSIS — N200 Calculus of kidney: Secondary | ICD-10-CM | POA: Diagnosis not present

## 2019-08-24 ENCOUNTER — Telehealth: Payer: Self-pay | Admitting: Interventional Cardiology

## 2019-08-24 NOTE — Telephone Encounter (Signed)
Patient is calling wanting to clarify that Dr. Tamala Julian has faxed over forms to [employer] for patients Eliquis. Please advise.

## 2019-08-25 NOTE — Telephone Encounter (Signed)
**Note De-Identified [name] Obfuscation** The pt is requesting that we fax Dr Darliss Ridgel part of a [employer] pt asst application for her Eliquis to [employer].  I have completed the MD part of a [employer] pt asst application, scanned and emailed it to Dr Darliss Ridgel nurse along with a cover letter so she can obtain Dr Darliss Ridgel signature, date form and to fax to [employer] pt asst foundation at number written on the cover letter.

## 2019-08-28 NOTE — Telephone Encounter (Signed)
Paperwork signed and faxed to [employer].

## 2019-09-04 ENCOUNTER — Other Ambulatory Visit: Payer: Self-pay

## 2019-09-04 MED ORDER — DILTIAZEM HCL ER COATED BEADS 120 MG PO CP24: 120.0000 mg | ORAL_CAPSULE | Freq: Every day | ORAL | 0 refills | Status: DC

## 2019-09-12 NOTE — Telephone Encounter (Signed)
**Note De-Identified Kayah Hecker Obfuscation** Letter received from [employer] Pt Asst stating that they denied the pt asst with her Eliquis at this time. Reason: Eliquis is covered by the pts Ins.plan.  The letter states that they have notified the pt of this denial as well.

## 2019-09-28 NOTE — Progress Notes (Signed)
Cardiology Office Note:    Date:  09/29/2019   ID:  [name], DOB [birth year], MRN [id number]  PCP:  Jani Gravel, MD  Cardiologist:  [name], MD   Referring MD: Jani Gravel, MD   Chief Complaint  Patient presents with  . Atrial Fibrillation    History of Present Illness:    [name] is a 74 y.o. female with a hx of paroxysmal atrial fibrillation, first-degree AV block, diastolic heart failure, and hyperlipidemia.  Recent trauma to the left axilla.  She was getting out of bed, her hand slipped, and the axilla was crushed against a bed railing.  2 days later has severe pain.  No significant bruising.  Pain is now slowly improving.  No episodes of atrial fibrillation that she is aware of.  No bleeding complications.  She denies transient neurological complaints.  Past Medical History:  Diagnosis Date  . Acute hypoxemic respiratory failure ([place]) 10/24/2016  . Arthritis    both knees  . Atrial fibrillation, chronic ([place])   . Carpal tunnel syndrome, bilateral    both hands go to sleep at times from elbows down  . Chronic diastolic heart failure ([place])   . DDD (degenerative disc disease), lumbosacral   . Dyspnea    during chronic bronchitis, resolved at this time  . First degree AV block   . Hematuria 11/15/2017  . History of kidney stones   . History of rib fracture 03/2011  . Lumbar stenosis   . Obesity   . Postlaminectomy syndrome   . Pre-diabetes   . Renal calculi    BILATERAL  . Right ureteral stone   . Rotator cuff tear, left   . Rotator cuff tear, right   . UTI (urinary tract infection)     Past Surgical History:  Procedure Laterality Date  . ANTERIOR CERVICAL DECOMP/DISCECTOMY FUSION  03-18-2011   C3 -- C7  . ANTERIOR LATERAL LUMBAR FUSION 4 LEVELS N/A 05/06/2018   Procedure: Lumbar one- two Lumbar two -three Lumbar  three-four Lumbar four-five Anterolateral decompression/fusion/posterior bilateral laminectomy Lumbar four-five Lumbar five  Sacral one/posterior percutaneous fixation/mazor;  Surgeon: Kristeen Miss, MD;  Location: [facility];  Service: Neurosurgery;  Laterality: N/A;  . APPENDECTOMY    . APPLICATION OF ROBOTIC ASSISTANCE FOR SPINAL PROCEDURE N/A 05/06/2018   Procedure: APPLICATION OF ROBOTIC ASSISTANCE FOR SPINAL PROCEDURE;  Surgeon: Kristeen Miss, MD;  Location: [facility];  Service: Neurosurgery;  Laterality: N/A;  . BILATERAL CARPAL TUNNEL RELEASE     3 screws in right hand, thumb, middle finger, and index finger,   . CARDIAC CATHETERIZATION N/A 01/16/2015   Procedure: Left Heart Cath and Coronary Angiography;  Surgeon: Belva Crome, MD;  Location: [facility];  Service: Cardiovascular;  Laterality: N/A;  . CHOLECYSTECTOMY OPEN  1970'S  . COLONOSCOPY  2018  . CYSTOSCOPY WITH RETROGRADE PYELOGRAM, URETEROSCOPY AND STENT PLACEMENT N/A 02/13/2013   Procedure: CYSTOSCOPY WITH RETROGRADE PYELOGRAM, URETEROSCOPY AND LITHOTRIPSY ;  Surgeon: Claybon Jabs, MD;  Location[facility];  Service: Urology;  Laterality: N/A;  . CYSTOSCOPY WITH URETEROSCOPY AND STENT PLACEMENT Left 04/17/2019   Procedure: CYSTOSCOPY WITH LEFT  RETROGRADE PYELOGRAM, LEFT URETEROSCOPY, HOLMIUM LASER AND LEFT STENT PLACEMENT;  Surgeon: Kathie Rhodes, MD;  Location: [facility];  Service: Urology;  Laterality: Left;  . CYSTOSCOPY/URETEROSCOPY/HOLMIUM LASER/STENT PLACEMENT Right 11/22/2017   Procedure: CYSTOSCOPY/URETEROSCOPY/RETROGRADE PYELOGRAM/HOLMIUM LASER/STENT PLACEMENT;  Surgeon: Kathie Rhodes, MD;  Location: [facility];  Service: Urology;  Laterality: Right;  . EXTRACORPOREAL SHOCK WAVE LITHOTRIPSY Right 05-16-2012  . FINGER ARTHROSCOPY WITH CARPOMETACARPEL ([facility]) ARTHROPLASTY    . HOLMIUM LASER APPLICATION Right [id number]   Procedure: HOLMIUM LASER APPLICATION;  Surgeon: Claybon Jabs, MD;  Location[facility];  Service: Urology;  Laterality: Right;  . IR URETERAL STENT LEFT NEW ACCESS W/O SEP NEPHROSTOMY  CATH  02/13/2019  . KNEE ARTHROSCOPY Bilateral   . LUMBAR LAMINECTOMY  X2  1960'S  . LUMBAR PERCUTANEOUS PEDICLE SCREW 4 LEVEL N/A 05/06/2018   Procedure: LUMBAR PERCUTANEOUS PEDICLE SCREW LUMBAR ONE TO LUMBAR FIVE WITH MAZOR ;POSTERIOR LATERAL LAMINECTOMY LUMBAR FOUR-FIVE,FIVE-SACRAL ONE;  Surgeon: Kristeen Miss, MD;  Location: [place];  Service: Neurosurgery;  Laterality: N/A;  . NASAL SEPTUM SURGERY  1970'S  . NEPHROLITHOTOMY Left 02/20/2019   Procedure: NEPHROLITHOTOMY PERCUTANEOUS;  Surgeon: Kathie Rhodes, MD;  Location: [facility];  Service: Urology;  Laterality: Left;  . SHOULDER OPEN ROTATOR CUFF REPAIR Bilateral LEFT 01-11-2001/   RIGHT 07-21-2002  . TOTAL KNEE ARTHROPLASTY  04/13/2012   Procedure: TOTAL KNEE ARTHROPLASTY;  Surgeon: Gearlean Alf, MD;  Location: [facility];  Service: Orthopedics;  Laterality: Left;  . TOTAL KNEE ARTHROPLASTY  08/08/2012   Procedure: TOTAL KNEE ARTHROPLASTY;  Surgeon: Gearlean Alf, MD;  Location: [facility];  Service: Orthopedics;  Laterality: Right;  . TRANSTHORACIC ECHOCARDIOGRAM  03-29-2011   MODERATE LVH/ EF 60-65%/ MILD MR  . URETEROSCOPY WITH HOLMIUM LASER LITHOTRIPSY Left 06/19/2019   Procedure: URETEROSCOPY WITH HOLMIUM LASER LITHOTRIPSY/INSERTION OF DOUBLE J STENT;  Surgeon: Kathie Rhodes, MD;  Location: [facility];  Service: Urology;  Laterality: Left;  Marland Kitchen VAGINAL HYSTERECTOMY  1960'S    Current Medications: Current Meds  Medication Sig  . acetaminophen (TYLENOL) 650 MG CR tablet Take 1,300 mg by mouth every 8 (eight) hours as needed for pain.  Marland Kitchen apixaban (ELIQUIS) 5 MG TABS tablet Take 1 tablet (5 mg total) by mouth 2 (two) times daily.  . diclofenac sodium (VOLTAREN) 1 % GEL Apply 2 g topically 3 (three) times daily as needed (back pain).   Marland Kitchen diltiazem (CARTIA XT) 120 MG 24 hr capsule Take 1 capsule (120 mg total) by mouth daily.  . montelukast (SINGULAIR) 10 MG tablet Take 1 tablet (10 mg total) by mouth at bedtime.  . Oxycodone HCl 10 MG TABS Take 1  tablet (10 mg total) by mouth every 4 (four) hours as needed. (Patient taking differently: Take 5 mg by mouth every 8 (eight) hours as needed (pain). )  . Polyethyl Glycol-Propyl Glycol (SYSTANE) 0.4-0.3 % SOLN Place 1 drop into both eyes 3 (three) times daily as needed (dry/irritated eyes.).     Allergies:   Contrast media [iodinated diagnostic agents], Doxycycline, Ancef [cefazolin], Augmentin [amoxicillin-pot clavulanate], Sulfa antibiotics, and Tape   Social History   Socioeconomic History  . Marital status: Married    Spouse name: Not on file  . Number of children: Not on file  . Years of education: Not on file  . Highest education level: Not on file  Occupational History  . Not on file  Tobacco Use  . Smoking status: Never Smoker  . Smokeless tobacco: Never Used  Substance and Sexual Activity  . Alcohol use: No  . Drug use: No  . Sexual activity: Not Currently    Birth control/protection: Surgical  Other Topics Concern  . Not on file  Social History Narrative  . Not on file   Social Determinants of Health   Financial Resource Strain:   .  Difficulty of Paying Living Expenses:   Food Insecurity:   . Worried About Charity fundraiser in the Last Year:   . Arboriculturist in the Last Year:   Transportation Needs:   . Film/video editor (Medical):   Marland Kitchen Lack of Transportation (Non-Medical):   Physical Activity:   . Days of Exercise per Week:   . Minutes of Exercise per Session:   Stress:   . Feeling of Stress :   Social Connections:   . Frequency of Communication with Friends and Family:   . Frequency of Social Gatherings with Friends and Family:   . Attends Religious Services:   . Active Member of Clubs or Organizations:   . Attends Archivist Meetings:   Marland Kitchen Marital Status:      Family History: The patient's family history includes Diabetes in her brother, brother, and sister; Diabetes type II in her mother; Heart disease in her brother and brother;  Hypertension in her brother, brother, and sister; Lung cancer in her father.  ROS:   Please see the history of present illness.    Left axillary and upper rib cage discomfort.  All other systems reviewed and are negative.  EKGs/Labs/Other Studies Reviewed:    The following studies were reviewed today: No new data  EKG:  EKG no new data  Recent Labs: 02/20/2019: ALT 34 06/15/2019: BUN 18; Creatinine, Ser 0.74; Hemoglobin 11.3; Platelets 282; Potassium 4.2; Sodium 140  Recent Lipid Panel No results found for: CHOL, TRIG, HDL, CHOLHDL, VLDL, LDLCALC, LDLDIRECT  Physical Exam:    VS:  BP 130/70   Pulse 77   Ht 5\' 2"  (1.575 m)   Wt 250 lb (113.4 kg)   SpO2 97%   BMI 45.73 kg/m     Wt Readings from Last 3 Encounters:  09/29/19 250 lb (113.4 kg)  06/15/19 233 lb (105.7 kg)  04/17/19 228 lb 2 oz (103.5 kg)     GEN: Morbid obesity. No acute distress HEENT: Normal NECK: No JVD. LYMPHATICS: No lymphadenopathy CARDIAC:  RRR without murmur, gallop, or edema. VASCULAR:  Normal Pulses. No bruits. RESPIRATORY:  Clear to auscultation without rales, wheezing or rhonchi  ABDOMEN: Soft, non-tender, non-distended, No pulsatile mass, MUSCULOSKELETAL: No deformity  SKIN: Warm and dry NEUROLOGIC:  Alert and oriented x 3 PSYCHIATRIC:  Normal affect   ASSESSMENT:    1. Paroxysmal atrial fibrillation (HCC)   2. Chronic diastolic heart failure ([place])   3. Bilateral lower extremity edema   4. Essential hypertension   5. Chronic anticoagulation   6. Mixed hyperlipidemia   7. Educated about COVID-19 virus infection    PLAN:    In order of problems listed above:  1. Currently stable.  No clinical evidence of A. fib on today's exam.  Protected by anticoagulation. 2. Lower extremity edema is noted but neck veins are flat and lungs are clear.  No change in therapy is needed. 3. Persistent, and possibly related to pulmonary hypertension.  Suspicion of obstructive sleep apnea. 4. Blood  pressure control is adequate.  Target 130/80 mmHg. 5. Eliquis 5 mg twice daily.  She is having financial difficulties.  We gave samples today.  It is not okay to take Eliquis once a day. 6. LDL target less than 70. 7. She has received the COVID-19 vaccine.  Social distancing is being practiced.   Medication Adjustments/Labs and Tests Ordered: Current medicines are reviewed at length with the patient today.  Concerns regarding medicines are outlined above.  No orders of the defined types were placed in this encounter.  No orders of the defined types were placed in this encounter.   Patient Instructions  Medication Instructions:  Your physician recommends that you continue on your current medications as directed. Please refer to the Current Medication list given to you today.  *If you need a refill on your cardiac medications before your next appointment, please call your pharmacy*   Lab Work: None If you have labs (blood work) drawn today and your tests are completely normal, you will receive your results only by: Marland Kitchen MyChart Message (if you have MyChart) OR . A paper copy in the mail If you have any lab test that is abnormal or we need to change your treatment, we will call you to review the results.   Testing/Procedures: None   Follow-Up: At [facility], you and your health needs are our priority.  As part of our continuing mission to provide you with exceptional heart care, we have created designated Provider Care Teams.  These Care Teams include your primary Cardiologist (physician) and Advanced Practice Providers (APPs -  Physician Assistants and Nurse Practitioners) who all work together to provide you with the care you need, when you need it.  We recommend signing up for the patient portal called "MyChart".  Sign up information is provided on this After Visit Summary.  MyChart is used to connect with patients for Virtual Visits (Telemedicine).  Patients are able to view  lab/test results, encounter notes, upcoming appointments, etc.  Non-urgent messages can be sent to your provider as well.   To learn more about what you can do with MyChart, go to [email or web address].    Your next appointment:   12 month(s)  The format for your next appointment:   In Person  Provider:   You may see [name], MD or one of the following Advanced Practice Providers on your designated Care Team:    Truitt Merle, NP  Cecilie Kicks, NP  Kathyrn Drown, NP    Other Instructions      Signed, [name], MD  09/29/2019 9:52 AM    [place]

## 2019-09-29 ENCOUNTER — Encounter: Payer: Self-pay | Admitting: Interventional Cardiology

## 2019-09-29 ENCOUNTER — Other Ambulatory Visit: Payer: Self-pay

## 2019-09-29 ENCOUNTER — Ambulatory Visit: Payer: PPO | Admitting: Interventional Cardiology

## 2019-09-29 VITALS — BP 130/70 | HR 77 | Ht 62.0 in | Wt 250.0 lb

## 2019-09-29 DIAGNOSIS — R600 Localized edema: Secondary | ICD-10-CM | POA: Diagnosis not present

## 2019-09-29 DIAGNOSIS — E782 Mixed hyperlipidemia: Secondary | ICD-10-CM

## 2019-09-29 DIAGNOSIS — I480 Paroxysmal atrial fibrillation: Secondary | ICD-10-CM

## 2019-09-29 DIAGNOSIS — I5032 Chronic diastolic (congestive) heart failure: Secondary | ICD-10-CM | POA: Diagnosis not present

## 2019-09-29 DIAGNOSIS — Z7189 Other specified counseling: Secondary | ICD-10-CM

## 2019-09-29 DIAGNOSIS — I10 Essential (primary) hypertension: Secondary | ICD-10-CM | POA: Diagnosis not present

## 2019-09-29 DIAGNOSIS — Z7901 Long term (current) use of anticoagulants: Secondary | ICD-10-CM | POA: Diagnosis not present

## 2019-09-29 NOTE — Patient Instructions (Signed)

## 2019-10-09 ENCOUNTER — Other Ambulatory Visit: Payer: Self-pay

## 2019-10-09 DIAGNOSIS — I480 Paroxysmal atrial fibrillation: Secondary | ICD-10-CM

## 2019-10-09 MED ORDER — APIXABAN 5 MG PO TABS: 5.0000 mg | ORAL_TABLET | Freq: Two times a day (BID) | ORAL | 5 refills | Status: DC

## 2019-10-09 NOTE — Telephone Encounter (Signed)
Age 74, weight 113kg, SCr 0.74 on 06/15/19, last OV March 2021, afib indication

## 2019-10-10 NOTE — Telephone Encounter (Signed)
**Note De-Identified Dhanya Bogle Obfuscation** Letter received from [employer] Pt Asst program stating that they have approved the pt for asst with her Eliquis. Approval is good until [id number] Application Case#: [id number]

## 2019-10-25 DIAGNOSIS — N200 Calculus of kidney: Secondary | ICD-10-CM | POA: Diagnosis not present

## 2019-10-25 DIAGNOSIS — R8279 Other abnormal findings on microbiological examination of urine: Secondary | ICD-10-CM | POA: Diagnosis not present

## 2019-11-30 ENCOUNTER — Other Ambulatory Visit: Payer: Self-pay

## 2019-11-30 MED ORDER — DILTIAZEM HCL ER COATED BEADS 120 MG PO CP24: 120.0000 mg | ORAL_CAPSULE | Freq: Every day | ORAL | 3 refills | Status: DC

## 2019-12-12 ENCOUNTER — Telehealth: Payer: Self-pay | Admitting: Interventional Cardiology

## 2020-01-19 DIAGNOSIS — N200 Calculus of kidney: Secondary | ICD-10-CM | POA: Diagnosis not present

## 2020-01-19 DIAGNOSIS — R8271 Bacteriuria: Secondary | ICD-10-CM | POA: Diagnosis not present

## 2020-01-19 DIAGNOSIS — N3000 Acute cystitis without hematuria: Secondary | ICD-10-CM | POA: Diagnosis not present

## 2020-02-07 ENCOUNTER — Other Ambulatory Visit: Payer: Self-pay | Admitting: Internal Medicine

## 2020-02-07 DIAGNOSIS — Z1231 Encounter for screening mammogram for malignant neoplasm of breast: Secondary | ICD-10-CM

## 2020-02-16 DIAGNOSIS — M961 Postlaminectomy syndrome, not elsewhere classified: Secondary | ICD-10-CM | POA: Diagnosis not present

## 2020-02-16 DIAGNOSIS — N200 Calculus of kidney: Secondary | ICD-10-CM | POA: Diagnosis not present

## 2020-02-16 DIAGNOSIS — Z79899 Other long term (current) drug therapy: Secondary | ICD-10-CM | POA: Diagnosis not present

## 2020-02-16 DIAGNOSIS — N3000 Acute cystitis without hematuria: Secondary | ICD-10-CM | POA: Diagnosis not present

## 2020-02-16 DIAGNOSIS — Z5181 Encounter for therapeutic drug level monitoring: Secondary | ICD-10-CM | POA: Diagnosis not present

## 2020-02-16 DIAGNOSIS — M47816 Spondylosis without myelopathy or radiculopathy, lumbar region: Secondary | ICD-10-CM | POA: Diagnosis not present

## 2020-02-16 DIAGNOSIS — Z79891 Long term (current) use of opiate analgesic: Secondary | ICD-10-CM | POA: Diagnosis not present

## 2020-02-22 DIAGNOSIS — E118 Type 2 diabetes mellitus with unspecified complications: Secondary | ICD-10-CM | POA: Diagnosis not present

## 2020-02-23 ENCOUNTER — Ambulatory Visit: Payer: PPO

## 2020-03-15 ENCOUNTER — Ambulatory Visit
Admission: RE | Admit: 2020-03-15 | Discharge: 2020-03-15 | Disposition: A | Discharge status: Home / Self Care | Payer: PPO | Source: Ambulatory Visit | Attending: Internal Medicine | Admitting: Internal Medicine

## 2020-03-15 ENCOUNTER — Other Ambulatory Visit: Payer: Self-pay

## 2020-03-15 DIAGNOSIS — Z1231 Encounter for screening mammogram for malignant neoplasm of breast: Secondary | ICD-10-CM

## 2020-03-26 LAB — HM MAMMOGRAPHY

## 2020-04-19 DIAGNOSIS — H26492 Other secondary cataract, left eye: Secondary | ICD-10-CM | POA: Diagnosis not present

## 2020-06-04 DIAGNOSIS — N3000 Acute cystitis without hematuria: Secondary | ICD-10-CM | POA: Diagnosis not present

## 2020-06-04 DIAGNOSIS — R3915 Urgency of urination: Secondary | ICD-10-CM | POA: Diagnosis not present

## 2020-08-12 ENCOUNTER — Telehealth: Payer: Self-pay | Admitting: Interventional Cardiology

## 2020-08-12 NOTE — Telephone Encounter (Signed)
**Note De-Identified Verdell Kincannon Obfuscation** I advised the pt that she can fax her completed [employer]MSPA[employer] application for Eliquis as well as her documents required per [employer] to ATTN: Jeani Hawking at fax number [phone].  She is aware that we handle Dr Darliss Ridgel part of the application and will fax all to [facility].  She thanked me for my assistance.

## 2020-08-12 NOTE — Telephone Encounter (Signed)
Patient is calling to see if she needs to bring in [employer] form in order for her to get assistance with her Eliquis or can she send it as a fax. Please advise.

## 2020-08-15 ENCOUNTER — Telehealth: Payer: Self-pay

## 2020-08-15 NOTE — Telephone Encounter (Signed)
**Note De-Identified Isaiahs Chancy Obfuscation** The pt left the MD page of her [employer]MSPAF application for Eliquis at the office with request that we complete the page, have Dr Tamala Julian sign and date and to fax to [facility] once completed.  I completed the MD page and emailed it to Dr Darliss Ridgel nurse so she can obtain his signature, date it, and to fax to [facility] at number written on cover letter included.

## 2020-08-16 DIAGNOSIS — M5416 Radiculopathy, lumbar region: Secondary | ICD-10-CM | POA: Diagnosis not present

## 2020-08-19 NOTE — Telephone Encounter (Signed)
Paperwork completed and faxed. Confirmation received.

## 2020-09-05 DIAGNOSIS — M5416 Radiculopathy, lumbar region: Secondary | ICD-10-CM | POA: Diagnosis not present

## 2020-09-06 ENCOUNTER — Ambulatory Visit: Payer: PPO | Admitting: Family Medicine

## 2020-09-24 ENCOUNTER — Telehealth: Payer: Self-pay | Admitting: Interventional Cardiology

## 2020-09-24 DIAGNOSIS — N3000 Acute cystitis without hematuria: Secondary | ICD-10-CM | POA: Diagnosis not present

## 2020-09-24 DIAGNOSIS — R310 Gross hematuria: Secondary | ICD-10-CM | POA: Diagnosis not present

## 2020-09-24 NOTE — Telephone Encounter (Signed)
New Message:     Pt just left her Urologist. She is passing blood in her urine and it it have gotten worse. The Urologist wants her to stop her Eliquis. Pt says she is scared to stop until she talks to Dr Tamala Julian about it.

## 2020-09-24 NOTE — Telephone Encounter (Signed)
Pt seen by Urology today and they suggested that she hold her Eliquis due to blood in her urine.  Pt mentioned that she has had blood in her urine for years but it has worsened recently to where the blood is bright red and heavy.  Pt has a hx of renal calculi.  Mentions that she has bacteria in her urine that Urology is also working to clear up.  Pt has not had a recent CBC anywhere.  Pt wanted to know if Dr. Tamala Julian felt like she would be ok to come off of Eliquis for a short period of time to see if the blood clears up?  If so, how long?  Advised I will send to Dr. Tamala Julian for review.

## 2020-09-25 NOTE — Telephone Encounter (Signed)
It will be relatively safe.  She seems to be in normal sinus rhythm most of the time.  She can hold the medication for days to weeks.  If she feels her heart racing/feels she is back in atrial fibrillation, we should resume the medication.  My recommendation will be to hold for 3 to 7 days to allow the bleeding to stop and then consider reinstitution.

## 2020-09-25 NOTE — Telephone Encounter (Signed)
Spoke with pt and made her aware of recommendations.  Pt verbalized understanding and was appreciative for call.  

## 2020-10-08 NOTE — Telephone Encounter (Signed)
Bleeding on Eliquis is related to underlying predisposition to bleed. If they can fix the problem, options are stop anticoagulation and accept stroke risk on ASA, consider Watchman, or continue Eliquis despite bleeding(assuming not heavy).

## 2020-10-11 ENCOUNTER — Other Ambulatory Visit: Payer: Self-pay | Admitting: Interventional Cardiology

## 2020-10-11 DIAGNOSIS — Z0000 Encounter for general adult medical examination without abnormal findings: Secondary | ICD-10-CM | POA: Diagnosis not present

## 2020-10-11 DIAGNOSIS — I10 Essential (primary) hypertension: Secondary | ICD-10-CM | POA: Diagnosis not present

## 2020-10-11 DIAGNOSIS — E785 Hyperlipidemia, unspecified: Secondary | ICD-10-CM | POA: Diagnosis not present

## 2020-10-11 DIAGNOSIS — E118 Type 2 diabetes mellitus with unspecified complications: Secondary | ICD-10-CM | POA: Diagnosis not present

## 2020-10-11 DIAGNOSIS — I480 Paroxysmal atrial fibrillation: Secondary | ICD-10-CM

## 2020-10-11 NOTE — Telephone Encounter (Signed)
Eliquis 5mg  refill request received. Patient is 75 years old, weight-113.4kg, Crea-0.85 on 02/22/2020 via [facility] from [employer] [facility], [place], and last seen by Dr. Tamala Julian on 09/29/2019 and has a pending appt on 12/06/2020. Dose is appropriate based on dosing criteria. Will send in refill to requested pharmacy.

## 2020-10-15 DIAGNOSIS — D1801 Hemangioma of skin and subcutaneous tissue: Secondary | ICD-10-CM | POA: Diagnosis not present

## 2020-10-15 DIAGNOSIS — D485 Neoplasm of uncertain behavior of skin: Secondary | ICD-10-CM | POA: Diagnosis not present

## 2020-10-15 DIAGNOSIS — L821 Other seborrheic keratosis: Secondary | ICD-10-CM | POA: Diagnosis not present

## 2020-10-15 DIAGNOSIS — L820 Inflamed seborrheic keratosis: Secondary | ICD-10-CM | POA: Diagnosis not present

## 2020-10-15 DIAGNOSIS — L2084 Intrinsic (allergic) eczema: Secondary | ICD-10-CM | POA: Diagnosis not present

## 2020-10-15 DIAGNOSIS — D225 Melanocytic nevi of trunk: Secondary | ICD-10-CM | POA: Diagnosis not present

## 2020-10-18 DIAGNOSIS — Z0000 Encounter for general adult medical examination without abnormal findings: Secondary | ICD-10-CM | POA: Diagnosis not present

## 2020-10-18 DIAGNOSIS — I480 Paroxysmal atrial fibrillation: Secondary | ICD-10-CM | POA: Diagnosis not present

## 2020-10-18 DIAGNOSIS — E1142 Type 2 diabetes mellitus with diabetic polyneuropathy: Secondary | ICD-10-CM | POA: Diagnosis not present

## 2020-10-18 DIAGNOSIS — M5136 Other intervertebral disc degeneration, lumbar region: Secondary | ICD-10-CM | POA: Diagnosis not present

## 2020-10-18 DIAGNOSIS — N200 Calculus of kidney: Secondary | ICD-10-CM | POA: Diagnosis not present

## 2020-10-18 DIAGNOSIS — J3089 Other allergic rhinitis: Secondary | ICD-10-CM | POA: Diagnosis not present

## 2020-10-18 DIAGNOSIS — M1990 Unspecified osteoarthritis, unspecified site: Secondary | ICD-10-CM | POA: Diagnosis not present

## 2020-10-18 DIAGNOSIS — Z6841 Body Mass Index (BMI) 40.0 and over, adult: Secondary | ICD-10-CM | POA: Diagnosis not present

## 2020-11-01 DIAGNOSIS — N3000 Acute cystitis without hematuria: Secondary | ICD-10-CM | POA: Diagnosis not present

## 2020-11-01 DIAGNOSIS — R310 Gross hematuria: Secondary | ICD-10-CM | POA: Diagnosis not present

## 2020-11-04 ENCOUNTER — Other Ambulatory Visit: Payer: Self-pay | Admitting: Interventional Cardiology

## 2020-11-06 NOTE — Telephone Encounter (Signed)
**Note De-Identified [name] Obfuscation** Letter recieved [name] fax from [facility] stating that they have approved the pt for asst with her Eliquis. Approval is valid until 11/05/2021. Application Case#: [id number]  The letter states that they have also notified the pt of this approval as well.

## 2020-11-15 DIAGNOSIS — E1142 Type 2 diabetes mellitus with diabetic polyneuropathy: Secondary | ICD-10-CM | POA: Diagnosis not present

## 2020-11-15 DIAGNOSIS — Z6841 Body Mass Index (BMI) 40.0 and over, adult: Secondary | ICD-10-CM | POA: Diagnosis not present

## 2020-11-15 DIAGNOSIS — I480 Paroxysmal atrial fibrillation: Secondary | ICD-10-CM | POA: Diagnosis not present

## 2020-11-15 DIAGNOSIS — I509 Heart failure, unspecified: Secondary | ICD-10-CM | POA: Diagnosis not present

## 2020-12-06 ENCOUNTER — Ambulatory Visit: Payer: PPO | Admitting: Interventional Cardiology

## 2021-02-08 ENCOUNTER — Other Ambulatory Visit: Payer: Self-pay | Admitting: Interventional Cardiology

## 2021-02-10 ENCOUNTER — Other Ambulatory Visit: Payer: Self-pay

## 2021-02-10 MED ORDER — DILTIAZEM HCL ER COATED BEADS 120 MG PO CP24: 120.0000 mg | ORAL_CAPSULE | Freq: Every day | ORAL | 0 refills | Status: DC

## 2021-03-14 ENCOUNTER — Other Ambulatory Visit: Payer: Self-pay

## 2021-03-14 ENCOUNTER — Ambulatory Visit: Payer: PPO | Admitting: Interventional Cardiology

## 2021-03-14 ENCOUNTER — Encounter: Payer: Self-pay | Admitting: Interventional Cardiology

## 2021-03-14 ENCOUNTER — Ambulatory Visit (INDEPENDENT_AMBULATORY_CARE_PROVIDER_SITE_OTHER): Payer: PPO

## 2021-03-14 VITALS — BP 122/78 | HR 75 | Ht 62.0 in | Wt 260.5 lb

## 2021-03-14 DIAGNOSIS — E782 Mixed hyperlipidemia: Secondary | ICD-10-CM

## 2021-03-14 DIAGNOSIS — I5032 Chronic diastolic (congestive) heart failure: Secondary | ICD-10-CM

## 2021-03-14 DIAGNOSIS — Z7901 Long term (current) use of anticoagulants: Secondary | ICD-10-CM | POA: Diagnosis not present

## 2021-03-14 DIAGNOSIS — I480 Paroxysmal atrial fibrillation: Secondary | ICD-10-CM

## 2021-03-14 DIAGNOSIS — I10 Essential (primary) hypertension: Secondary | ICD-10-CM | POA: Diagnosis not present

## 2021-03-14 DIAGNOSIS — R600 Localized edema: Secondary | ICD-10-CM

## 2021-03-14 NOTE — Progress Notes (Signed)
Cardiology Office Note:    Date:  03/14/2021   ID:  [name], DOB 15-Jan-1946, MRN [id number]  PCP:  Jani Gravel, MD  Cardiologist:  [name], MD   Referring MD: Jani Gravel, MD   Chief Complaint  Patient presents with   Atrial Fibrillation     History of Present Illness:    [name] is a 75 y.o. female with a hx of paroxysmal atrial fibrillation, first-degree AV block, diastolic heart failure, and hyperlipidemia.      She feels well.  She does not think she is having atrial fibs.  7 years ago a monitor demonstrated greater than 1% burden of atrial fibrillation.  Anticoagulation therapy was started.  Atrial fibrillation at that time was for the most part asymptomatic.  Recently, she had significant bleeding on anticoagulation therapy and ultimately decided to discontinue it.  On aspirin, she has not had bleeding.  She has occasional palpitations but nothing lasting for any significant period of time.  Denies orthopnea, PND, lower extremity swelling.  Past Medical History:  Diagnosis Date   Acute hypoxemic respiratory failure ([place]) 10/24/2016   Arthritis    both knees   Atrial fibrillation, chronic (HCC)    Carpal tunnel syndrome, bilateral    both hands go to sleep at times from elbows down   Chronic diastolic heart failure (HCC)    DDD (degenerative disc disease), lumbosacral    Dyspnea    during chronic bronchitis, resolved at this time   First degree AV block    Hematuria 11/15/2017   History of kidney stones    History of rib fracture 03/2011   Lumbar stenosis    Obesity    Postlaminectomy syndrome    Pre-diabetes    Renal calculi    BILATERAL   Right ureteral stone    Rotator cuff tear, left    Rotator cuff tear, right    UTI (urinary tract infection)     Past Surgical History:  Procedure Laterality Date   ANTERIOR CERVICAL DECOMP/DISCECTOMY FUSION  03-18-2011   C3 -- C7   ANTERIOR LATERAL LUMBAR FUSION 4 LEVELS N/A 05/06/2018    Procedure: Lumbar one- two Lumbar two -three Lumbar  three-four Lumbar four-five Anterolateral decompression/fusion/posterior bilateral laminectomy Lumbar four-five Lumbar five Sacral one/posterior percutaneous fixation/mazor;  Surgeon: Kristeen Miss, MD;  Location: [place];  Service: Neurosurgery;  Laterality: N/A;   APPENDECTOMY     APPLICATION OF ROBOTIC ASSISTANCE FOR SPINAL PROCEDURE N/A 05/06/2018   Procedure: APPLICATION OF ROBOTIC ASSISTANCE FOR SPINAL PROCEDURE;  Surgeon: Kristeen Miss, MD;  Location: [facility];  Service: Neurosurgery;  Laterality: N/A;   BILATERAL CARPAL TUNNEL RELEASE     3 screws in right hand, thumb, middle finger, and index finger,    CARDIAC CATHETERIZATION N/A 01/16/2015   Procedure: Left Heart Cath and Coronary Angiography;  Surgeon: Belva Crome, MD;  Location: [facility];  Service: Cardiovascular;  Laterality: N/A;   CHOLECYSTECTOMY OPEN  1970'S   COLONOSCOPY  2018   CYSTOSCOPY WITH RETROGRADE PYELOGRAM, URETEROSCOPY AND STENT PLACEMENT N/A 02/13/2013   Procedure: CYSTOSCOPY WITH RETROGRADE PYELOGRAM, URETEROSCOPY AND LITHOTRIPSY ;  Surgeon: Claybon Jabs, MD;  Location: [facility];  Service: Urology;  Laterality: N/A;   CYSTOSCOPY WITH URETEROSCOPY AND STENT PLACEMENT Left 04/17/2019   Procedure: CYSTOSCOPY WITH LEFT  RETROGRADE PYELOGRAM, LEFT URETEROSCOPY, HOLMIUM LASER AND LEFT STENT PLACEMENT;  Surgeon: Kathie Rhodes, MD;  Location: [facility];  Service: Urology;  Laterality:  Left;   CYSTOSCOPY/URETEROSCOPY/HOLMIUM LASER/STENT PLACEMENT Right 11/22/2017   Procedure: CYSTOSCOPY/URETEROSCOPY/RETROGRADE PYELOGRAM/HOLMIUM LASER/STENT PLACEMENT;  Surgeon: Kathie Rhodes, MD;  Location: [facility];  Service: Urology;  Laterality: Right;   EXTRACORPOREAL SHOCK WAVE LITHOTRIPSY Right 05-16-2012   FINGER ARTHROSCOPY WITH CARPOMETACARPEL [facility]) ARTHROPLASTY     HOLMIUM LASER APPLICATION Right [id number]   Procedure: HOLMIUM LASER APPLICATION;   Surgeon: Claybon Jabs, MD;  Location: [facility];  Service: Urology;  Laterality: Right;   IR URETERAL STENT LEFT NEW ACCESS W/O SEP NEPHROSTOMY CATH  02/13/2019   KNEE ARTHROSCOPY Bilateral    LUMBAR LAMINECTOMY  X2  1960'S   LUMBAR PERCUTANEOUS PEDICLE SCREW 4 LEVEL N/A 05/06/2018   Procedure: LUMBAR PERCUTANEOUS PEDICLE SCREW LUMBAR ONE TO LUMBAR FIVE WITH MAZOR ;POSTERIOR LATERAL LAMINECTOMY LUMBAR FOUR-FIVE,FIVE-SACRAL ONE;  Surgeon: Kristeen Miss, MD;  Location: [place];  Service: Neurosurgery;  Laterality: N/A;   NASAL SEPTUM SURGERY  1970'S   NEPHROLITHOTOMY Left 02/20/2019   Procedure: NEPHROLITHOTOMY PERCUTANEOUS;  Surgeon: Kathie Rhodes, MD;  Location: [facility];  Service: Urology;  Laterality: Left;   SHOULDER OPEN ROTATOR CUFF REPAIR Bilateral LEFT 01-11-2001/   RIGHT 07-21-2002   TOTAL KNEE ARTHROPLASTY  04/13/2012   Procedure: TOTAL KNEE ARTHROPLASTY;  Surgeon: Gearlean Alf, MD;  Location: [facility];  Service: Orthopedics;  Laterality: Left;   TOTAL KNEE ARTHROPLASTY  08/08/2012   Procedure: TOTAL KNEE ARTHROPLASTY;  Surgeon: Gearlean Alf, MD;  Location[facility];  Service: Orthopedics;  Laterality: Right;   TRANSTHORACIC ECHOCARDIOGRAM  03-29-2011   MODERATE LVH/ EF 60-65%/ MILD MR   URETEROSCOPY WITH HOLMIUM LASER LITHOTRIPSY Left 06/19/2019   Procedure: URETEROSCOPY WITH HOLMIUM LASER LITHOTRIPSY/INSERTION OF DOUBLE J STENT;  Surgeon: Kathie Rhodes, MD;  Location[facility];  Service: Urology;  Laterality: Left;   VAGINAL HYSTERECTOMY  1960'S    Current Medications: Current Meds  Medication Sig   acetaminophen (TYLENOL) 650 MG CR tablet Take 1,300 mg by mouth every 8 (eight) hours as needed for pain.   aspirin EC 81 MG tablet Take 81 mg by mouth daily. Swallow whole.   diclofenac sodium (VOLTAREN) 1 % GEL Apply 2 g topically 3 (three) times daily as needed (back pain).    montelukast (SINGULAIR) 10 MG tablet Take 1 tablet (10 mg total) by mouth at bedtime.    [DISCONTINUED] diltiazem (CARDIZEM CD) 120 MG 24 hr capsule Take 1 capsule (120 mg total) by mouth daily.     Allergies:   Contrast media [iodinated diagnostic agents], Doxycycline, Ancef [cefazolin], Augmentin [amoxicillin-pot clavulanate], Sulfa antibiotics, and Tape   Social History   Socioeconomic History   Marital status: Married    Spouse name: Not on file   Number of children: Not on file   Years of education: Not on file   Highest education level: Not on file  Occupational History   Not on file  Tobacco Use   Smoking status: Never   Smokeless tobacco: Never  Vaping Use   Vaping Use: Never used  Substance and Sexual Activity   Alcohol use: No   Drug use: No   Sexual activity: Not Currently    Birth control/protection: Surgical  Other Topics Concern   Not on file  Social History Narrative   Not on file   Social Determinants of Health   Financial Resource Strain: Not on file  Food Insecurity: Not on file  Transportation Needs: Not on file  Physical Activity: Not on file  Stress: Not on file  Social Connections: Not on file     Family History: The patient's family history includes Diabetes in her brother, brother, and sister; Diabetes type II in her mother; Heart disease in her brother and brother; Hypertension in her brother, brother, and sister; Lung cancer in her father.  ROS:   Please see the history of present illness.    No other complaints.  All other systems reviewed and are negative.  EKGs/Labs/Other Studies Reviewed:    The following studies were reviewed today: No new imaging  EKG:  EKG normal sinus rhythm with first-degree AV block at 270 ms.  Precordial T wave inversion V1 and V2.  Inferior Q waves.  There is no change when compared to 2020 EKG.  Recent Labs: No results found for requested labs within last 8760 hours.  Recent Lipid Panel No results found for: CHOL, TRIG, HDL, CHOLHDL, VLDL, LDLCALC, LDLDIRECT  Physical Exam:    VS:  BP  122/78   Pulse 75   Ht '5\' 2"'$  (1.575 m)   Wt 260 lb 8 oz (118.2 kg)   SpO2 96%   BMI 47.65 kg/m     Wt Readings from Last 3 Encounters:  03/14/21 260 lb 8 oz (118.2 kg)  09/29/19 250 lb (113.4 kg)  06/15/19 233 lb (105.7 kg)     GEN: Obese. No acute distress HEENT: Normal NECK: No JVD. LYMPHATICS: No lymphadenopathy CARDIAC: No murmur. RRR no gallop, or edema. VASCULAR:  Normal Pulses. No bruits. RESPIRATORY:  Clear to auscultation without rales, wheezing or rhonchi  ABDOMEN: Soft, non-tender, non-distended, No pulsatile mass, MUSCULOSKELETAL: No deformity  SKIN: Warm and dry NEUROLOGIC:  Alert and oriented x 3 PSYCHIATRIC:  Normal affect   ASSESSMENT:    1. Paroxysmal atrial fibrillation (HCC)   2. Chronic diastolic heart failure ([place])   3. Essential hypertension   4. Chronic anticoagulation   5. Bilateral lower extremity edema   6. Mixed hyperlipidemia    PLAN:    In order of problems listed above:  At least 2-week monitor to exclude asymptomatic high burden atrial fibrillation.  Because of bleeding, we will continue off of Eliquis therapy.  Continue aspirin. No evidence of volume overload. Blood pressure control is excellent. Off Eliquis, hematuria and vaginal bleeding has resolved. Not present today. Continue statin therapy.   If having significant atrial fibrillation on monitoring, we will have to readdress anticoagulation.  May need to consider left atrial occlusion device (Watchman device) this   Medication Adjustments/Labs and Tests Ordered: Current medicines are reviewed at length with the patient today.  Concerns regarding medicines are outlined above.  Orders Placed This Encounter  Procedures   LONG TERM MONITOR (3-14 DAYS)   EKG 12-Lead   No orders of the defined types were placed in this encounter.   Patient Instructions  Medication Instructions:  Your physician recommends that you continue on your current medications as directed. Please  refer to the Current Medication list given to you today.  *If you need a refill on your cardiac medications before your next appointment, please call your pharmacy*   Lab Work: None If you have labs (blood work) drawn today and your tests are completely normal, you will receive your results only by: [place] (if you have MyChart) OR A paper copy in the mail If you have any lab test that is abnormal or we need to change your treatment, we will call you to review the results.   Testing/Procedures: Your physician recommends that you wear a  monitor for 2 weeks.   Follow-Up: At [facility], you and your health needs are our priority.  As part of our continuing mission to provide you with exceptional heart care, we have created designated Provider Care Teams.  These Care Teams include your primary Cardiologist (physician) and Advanced Practice Providers (APPs -  Physician Assistants and Nurse Practitioners) who all work together to provide you with the care you need, when you need it.  We recommend signing up for the patient portal called "MyChart".  Sign up information is provided on this After Visit Summary.  MyChart is used to connect with patients for Virtual Visits (Telemedicine).  Patients are able to view lab/test results, encounter notes, upcoming appointments, etc.  Non-urgent messages can be sent to your provider as well.   To learn more about what you can do with MyChart, go to [email or web address].    Your next appointment:   1 year(s)  The format for your next appointment:   In Person  Provider:   You may see [name], MD or one of the following Advanced Practice Providers on your designated Care Team:   Cecilie Kicks, NP   Other Instructions  [place] Monitor Instructions  Your physician has requested you wear a ZIO patch monitor for 14 days.  This is a single patch monitor. [employer] supplies one patch monitor per enrollment.  Additional stickers are not available. Please do not apply patch if you will be having a Nuclear Stress Test,  Echocardiogram, Cardiac CT, MRI, or Chest Xray during the period you would be wearing the  monitor. The patch cannot be worn during these tests. You cannot remove and re-apply the  ZIO XT patch monitor.  Your ZIO patch monitor will be mailed 3 day USPS to your address on file. It may take 3-5 days  to receive your monitor after you have been enrolled.  Once you have received your monitor, please review the enclosed instructions. Your monitor  has already been registered assigning a specific monitor serial # to you.  Billing and Patient Assistance Program Information  We have supplied [employer] with any of your insurance information on file for billing purposes. [employer] offers a sliding scale Patient Assistance Program for patients that do not have  insurance, or whose insurance does not completely cover the cost of the ZIO monitor.  You must apply for the Patient Assistance Program to qualify for this discounted rate.  To apply, please call [employer] at [phone], select option 4, select option 2, ask to apply for  Patient Assistance Program. Theodore Demark will ask your household income, and how many people  are in your household. They will quote your out-of-pocket cost based on that information.  [employer] will also be able to set up a 36-month interest-free payment plan if needed.  Applying the monitor   Shave hair from upper left chest.  Hold abrader disc by orange tab. Rub abrader in 40 strokes over the upper left chest as  indicated in your monitor instructions.  Clean area with 4 enclosed alcohol pads. Let dry.  Apply patch as indicated in monitor instructions. Patch will be placed under collarbone on left  side of chest with arrow pointing upward.  Rub patch adhesive wings for 2 minutes. Remove white label marked "1". Remove the white  label marked "2". Rub patch adhesive wings  for 2 additional minutes.  While looking in a mirror, press and release button in center of patch. A small green  light will  flash 3-4 times. This will be your only indicator that the monitor has been turned on.  Do not shower for the first 24 hours. You may shower after the first 24 hours.  Press the button if you feel a symptom. You will hear a small click. Record Date, Time and  Symptom in the Patient Logbook.  When you are ready to remove the patch, follow instructions on the last 2 pages of Patient  Logbook. Stick patch monitor onto the last page of Patient Logbook.  Place Patient Logbook in the blue and white box. Use locking tab on box and tape box closed  securely. The blue and white box has prepaid postage on it. Please place it in the mailbox as  soon as possible. Your physician should have your test results approximately 7 days after the  monitor has been mailed back to [facility].  Call [place] at [phone] if you have questions regarding  your ZIO XT patch monitor. Call them immediately if you see an orange light blinking on your  monitor.  If your monitor falls off in less than 4 days, contact our Monitor department at [phone].  If your monitor becomes loose or falls off after 4 days call [employer] at [phone] for  suggestions on securing your monitor     Signed, [name], MD  03/14/2021 3:55 PM    [place]

## 2021-03-14 NOTE — Progress Notes (Unsigned)
Enrolled patient for a 14 day Zio XT  monitor to be mailed to patients home  °

## 2021-03-14 NOTE — Patient Instructions (Signed)
Medication Instructions:  Your physician recommends that you continue on your current medications as directed. Please refer to the Current Medication list given to you today.  *If you need a refill on your cardiac medications before your next appointment, please call your pharmacy*   Lab Work: None If you have labs (blood work) drawn today and your tests are completely normal, you will receive your results only by: [place] (if you have MyChart) OR A paper copy in the mail If you have any lab test that is abnormal or we need to change your treatment, we will call you to review the results.   Testing/Procedures: Your physician recommends that you wear a monitor for 2 weeks.   Follow-Up: At [facility], you and your health needs are our priority.  As part of our continuing mission to provide you with exceptional heart care, we have created designated Provider Care Teams.  These Care Teams include your primary Cardiologist (physician) and Advanced Practice Providers (APPs -  Physician Assistants and Nurse Practitioners) who all work together to provide you with the care you need, when you need it.  We recommend signing up for the patient portal called "MyChart".  Sign up information is provided on this After Visit Summary.  MyChart is used to connect with patients for Virtual Visits (Telemedicine).  Patients are able to view lab/test results, encounter notes, upcoming appointments, etc.  Non-urgent messages can be sent to your provider as well.   To learn more about what you can do with MyChart, go to [email or web address].    Your next appointment:   1 year(s)  The format for your next appointment:   In Person  Provider:   You may see [name], MD or one of the following Advanced Practice Providers on your designated Care Team:   Cecilie Kicks, NP   Other Instructions  [place] Monitor Instructions  Your physician has requested you wear a ZIO patch  monitor for 14 days.  This is a single patch monitor. [employer] supplies one patch monitor per enrollment. Additional stickers are not available. Please do not apply patch if you will be having a Nuclear Stress Test,  Echocardiogram, Cardiac CT, MRI, or Chest Xray during the period you would be wearing the  monitor. The patch cannot be worn during these tests. You cannot remove and re-apply the  ZIO XT patch monitor.  Your ZIO patch monitor will be mailed 3 day USPS to your address on file. It may take 3-5 days  to receive your monitor after you have been enrolled.  Once you have received your monitor, please review the enclosed instructions. Your monitor  has already been registered assigning a specific monitor serial # to you.  Billing and Patient Assistance Program Information  We have supplied [employer] with any of your insurance information on file for billing purposes. [employer] offers a sliding scale Patient Assistance Program for patients that do not have  insurance, or whose insurance does not completely cover the cost of the ZIO monitor.  You must apply for the Patient Assistance Program to qualify for this discounted rate.  To apply, please call [employer] at [phone], select option 4, select option 2, ask to apply for  Patient Assistance Program. Theodore Demark will ask your household income, and how many people  are in your household. They will quote your out-of-pocket cost based on that information.  [employer] will also be able to set up a 36-month interest-free payment plan if  needed.  Applying the monitor   Shave hair from upper left chest.  Hold abrader disc by orange tab. Rub abrader in 40 strokes over the upper left chest as  indicated in your monitor instructions.  Clean area with 4 enclosed alcohol pads. Let dry.  Apply patch as indicated in monitor instructions. Patch will be placed under collarbone on left  side of chest with arrow pointing upward.  Rub patch adhesive wings  for 2 minutes. Remove white label marked "1". Remove the white  label marked "2". Rub patch adhesive wings for 2 additional minutes.  While looking in a mirror, press and release button in center of patch. A small green light will  flash 3-4 times. This will be your only indicator that the monitor has been turned on.  Do not shower for the first 24 hours. You may shower after the first 24 hours.  Press the button if you feel a symptom. You will hear a small click. Record Date, Time and  Symptom in the Patient Logbook.  When you are ready to remove the patch, follow instructions on the last 2 pages of Patient  Logbook. Stick patch monitor onto the last page of Patient Logbook.  Place Patient Logbook in the blue and white box. Use locking tab on box and tape box closed  securely. The blue and white box has prepaid postage on it. Please place it in the mailbox as  soon as possible. Your physician should have your test results approximately 7 days after the  monitor has been mailed back to [facility].  Call [place] at [phone] if you have questions regarding  your ZIO XT patch monitor. Call them immediately if you see an orange light blinking on your  monitor.  If your monitor falls off in less than 4 days, contact our Monitor department at [phone].  If your monitor becomes loose or falls off after 4 days call [employer] at [phone] for  suggestions on securing your monitor

## 2021-03-20 ENCOUNTER — Telehealth: Payer: Self-pay | Admitting: Interventional Cardiology

## 2021-03-20 NOTE — Telephone Encounter (Signed)
Called to say that they had to send out a new zio patch to the patient. Please advise

## 2021-03-24 DIAGNOSIS — I480 Paroxysmal atrial fibrillation: Secondary | ICD-10-CM | POA: Diagnosis not present

## 2021-04-10 DIAGNOSIS — I480 Paroxysmal atrial fibrillation: Secondary | ICD-10-CM | POA: Diagnosis not present

## 2021-04-11 DIAGNOSIS — E1142 Type 2 diabetes mellitus with diabetic polyneuropathy: Secondary | ICD-10-CM | POA: Diagnosis not present

## 2021-04-11 DIAGNOSIS — M1990 Unspecified osteoarthritis, unspecified site: Secondary | ICD-10-CM | POA: Diagnosis not present

## 2021-04-11 DIAGNOSIS — I509 Heart failure, unspecified: Secondary | ICD-10-CM | POA: Diagnosis not present

## 2021-04-11 DIAGNOSIS — M5136 Other intervertebral disc degeneration, lumbar region: Secondary | ICD-10-CM | POA: Diagnosis not present

## 2021-04-14 DIAGNOSIS — I480 Paroxysmal atrial fibrillation: Secondary | ICD-10-CM | POA: Diagnosis not present

## 2021-04-14 DIAGNOSIS — I509 Heart failure, unspecified: Secondary | ICD-10-CM | POA: Diagnosis not present

## 2021-04-14 DIAGNOSIS — E1142 Type 2 diabetes mellitus with diabetic polyneuropathy: Secondary | ICD-10-CM | POA: Diagnosis not present

## 2021-04-25 DIAGNOSIS — I480 Paroxysmal atrial fibrillation: Secondary | ICD-10-CM | POA: Diagnosis not present

## 2021-04-25 DIAGNOSIS — J3089 Other allergic rhinitis: Secondary | ICD-10-CM | POA: Diagnosis not present

## 2021-04-25 DIAGNOSIS — E785 Hyperlipidemia, unspecified: Secondary | ICD-10-CM | POA: Diagnosis not present

## 2021-04-25 DIAGNOSIS — E1159 Type 2 diabetes mellitus with other circulatory complications: Secondary | ICD-10-CM | POA: Diagnosis not present

## 2021-04-25 DIAGNOSIS — R300 Dysuria: Secondary | ICD-10-CM | POA: Diagnosis not present

## 2021-04-25 DIAGNOSIS — M1990 Unspecified osteoarthritis, unspecified site: Secondary | ICD-10-CM | POA: Diagnosis not present

## 2021-04-25 DIAGNOSIS — R7989 Other specified abnormal findings of blood chemistry: Secondary | ICD-10-CM | POA: Diagnosis not present

## 2021-04-25 DIAGNOSIS — Z23 Encounter for immunization: Secondary | ICD-10-CM | POA: Diagnosis not present

## 2021-05-15 ENCOUNTER — Other Ambulatory Visit: Payer: Self-pay | Admitting: Interventional Cardiology

## 2021-05-16 ENCOUNTER — Telehealth: Payer: Self-pay | Admitting: Interventional Cardiology

## 2021-05-16 NOTE — Telephone Encounter (Signed)
*  STAT* If patient is at the pharmacy, call can be transferred to refill team.   1. Which medications need to be refilled? (please list name of each medication and dose if known) diltiazem (CARDIZEM CD) 120 MG 24 hr capsule  2. Which pharmacy/location (including street and city if local pharmacy) is medication to be sent to? [id number] - [place], [place] - [address] AT [address]  3. Do they need a 30 day or 90 day supply? [place]

## 2021-05-30 DIAGNOSIS — H04123 Dry eye syndrome of bilateral lacrimal glands: Secondary | ICD-10-CM | POA: Diagnosis not present

## 2021-05-30 DIAGNOSIS — N390 Urinary tract infection, site not specified: Secondary | ICD-10-CM | POA: Diagnosis not present

## 2021-06-13 ENCOUNTER — Ambulatory Visit: Payer: PPO | Admitting: Family Medicine

## 2021-06-20 ENCOUNTER — Ambulatory Visit: Payer: PPO | Admitting: Family Medicine

## 2021-06-25 ENCOUNTER — Other Ambulatory Visit: Payer: Self-pay | Admitting: Family Medicine

## 2021-06-25 ENCOUNTER — Other Ambulatory Visit: Payer: Self-pay | Admitting: Internal Medicine

## 2021-06-25 DIAGNOSIS — Z1231 Encounter for screening mammogram for malignant neoplasm of breast: Secondary | ICD-10-CM

## 2021-08-27 DIAGNOSIS — Z9889 Other specified postprocedural states: Secondary | ICD-10-CM | POA: Diagnosis not present

## 2021-08-27 DIAGNOSIS — N201 Calculus of ureter: Secondary | ICD-10-CM | POA: Diagnosis not present

## 2021-08-27 DIAGNOSIS — I700 Atherosclerosis of aorta: Secondary | ICD-10-CM | POA: Diagnosis not present

## 2021-08-27 DIAGNOSIS — N132 Hydronephrosis with renal and ureteral calculous obstruction: Secondary | ICD-10-CM | POA: Diagnosis not present

## 2021-08-27 DIAGNOSIS — K5730 Diverticulosis of large intestine without perforation or abscess without bleeding: Secondary | ICD-10-CM | POA: Diagnosis not present

## 2021-09-03 ENCOUNTER — Other Ambulatory Visit: Payer: Self-pay | Admitting: Urology

## 2021-09-03 DIAGNOSIS — N201 Calculus of ureter: Secondary | ICD-10-CM | POA: Diagnosis not present

## 2021-09-03 DIAGNOSIS — N200 Calculus of kidney: Secondary | ICD-10-CM | POA: Diagnosis not present

## 2021-09-11 DIAGNOSIS — N3000 Acute cystitis without hematuria: Secondary | ICD-10-CM | POA: Diagnosis not present

## 2021-09-23 ENCOUNTER — Inpatient Hospital Stay (HOSPITAL_COMMUNITY)
Admission: EM | Admit: 2021-09-23 | Discharge: 2021-09-27 | DRG: 920 | Disposition: A | Discharge status: Home / Self Care | Payer: PPO | Attending: Internal Medicine | Admitting: Internal Medicine

## 2021-09-23 ENCOUNTER — Emergency Department (HOSPITAL_COMMUNITY): Payer: PPO

## 2021-09-23 ENCOUNTER — Encounter (HOSPITAL_COMMUNITY): Payer: Self-pay

## 2021-09-23 ENCOUNTER — Other Ambulatory Visit: Payer: Self-pay

## 2021-09-23 DIAGNOSIS — Z91041 Radiographic dye allergy status: Secondary | ICD-10-CM | POA: Diagnosis not present

## 2021-09-23 DIAGNOSIS — N99840 Postprocedural hematoma of a genitourinary system organ or structure following a genitourinary system procedure: Principal | ICD-10-CM | POA: Diagnosis present

## 2021-09-23 DIAGNOSIS — E119 Type 2 diabetes mellitus without complications: Secondary | ICD-10-CM | POA: Diagnosis not present

## 2021-09-23 DIAGNOSIS — Z833 Family history of diabetes mellitus: Secondary | ICD-10-CM | POA: Diagnosis not present

## 2021-09-23 DIAGNOSIS — I480 Paroxysmal atrial fibrillation: Secondary | ICD-10-CM | POA: Diagnosis not present

## 2021-09-23 DIAGNOSIS — Z880 Allergy status to penicillin: Secondary | ICD-10-CM | POA: Diagnosis not present

## 2021-09-23 DIAGNOSIS — Z881 Allergy status to other antibiotic agents status: Secondary | ICD-10-CM | POA: Diagnosis not present

## 2021-09-23 DIAGNOSIS — E785 Hyperlipidemia, unspecified: Secondary | ICD-10-CM | POA: Diagnosis present

## 2021-09-23 DIAGNOSIS — Z91048 Other nonmedicinal substance allergy status: Secondary | ICD-10-CM

## 2021-09-23 DIAGNOSIS — E669 Obesity, unspecified: Secondary | ICD-10-CM | POA: Diagnosis not present

## 2021-09-23 DIAGNOSIS — N200 Calculus of kidney: Secondary | ICD-10-CM | POA: Diagnosis not present

## 2021-09-23 DIAGNOSIS — N390 Urinary tract infection, site not specified: Secondary | ICD-10-CM

## 2021-09-23 DIAGNOSIS — I482 Chronic atrial fibrillation, unspecified: Secondary | ICD-10-CM | POA: Diagnosis present

## 2021-09-23 DIAGNOSIS — Z20822 Contact with and (suspected) exposure to covid-19: Secondary | ICD-10-CM | POA: Diagnosis not present

## 2021-09-23 DIAGNOSIS — Z96653 Presence of artificial knee joint, bilateral: Secondary | ICD-10-CM | POA: Diagnosis not present

## 2021-09-23 DIAGNOSIS — D62 Acute posthemorrhagic anemia: Secondary | ICD-10-CM

## 2021-09-23 DIAGNOSIS — Z8249 Family history of ischemic heart disease and other diseases of the circulatory system: Secondary | ICD-10-CM | POA: Diagnosis not present

## 2021-09-23 DIAGNOSIS — I5032 Chronic diastolic (congestive) heart failure: Secondary | ICD-10-CM | POA: Diagnosis not present

## 2021-09-23 DIAGNOSIS — N201 Calculus of ureter: Secondary | ICD-10-CM | POA: Diagnosis not present

## 2021-09-23 DIAGNOSIS — Z79899 Other long term (current) drug therapy: Secondary | ICD-10-CM

## 2021-09-23 DIAGNOSIS — I110 Hypertensive heart disease with heart failure: Secondary | ICD-10-CM | POA: Diagnosis not present

## 2021-09-23 DIAGNOSIS — S37019A Minor contusion of unspecified kidney, initial encounter: Secondary | ICD-10-CM

## 2021-09-23 DIAGNOSIS — N12 Tubulo-interstitial nephritis, not specified as acute or chronic: Secondary | ICD-10-CM | POA: Diagnosis not present

## 2021-09-23 DIAGNOSIS — R7303 Prediabetes: Secondary | ICD-10-CM

## 2021-09-23 DIAGNOSIS — Z6841 Body Mass Index (BMI) 40.0 and over, adult: Secondary | ICD-10-CM | POA: Diagnosis not present

## 2021-09-23 DIAGNOSIS — Y838 Other surgical procedures as the cause of abnormal reaction of the patient, or of later complication, without mention of misadventure at the time of the procedure: Secondary | ICD-10-CM | POA: Diagnosis present

## 2021-09-23 DIAGNOSIS — R8271 Bacteriuria: Secondary | ICD-10-CM | POA: Diagnosis not present

## 2021-09-23 DIAGNOSIS — Z981 Arthrodesis status: Secondary | ICD-10-CM | POA: Diagnosis not present

## 2021-09-23 DIAGNOSIS — E1169 Type 2 diabetes mellitus with other specified complication: Secondary | ICD-10-CM | POA: Diagnosis present

## 2021-09-23 DIAGNOSIS — I10 Essential (primary) hypertension: Secondary | ICD-10-CM | POA: Diagnosis not present

## 2021-09-23 DIAGNOSIS — K5730 Diverticulosis of large intestine without perforation or abscess without bleeding: Secondary | ICD-10-CM | POA: Diagnosis not present

## 2021-09-23 DIAGNOSIS — Z888 Allergy status to other drugs, medicaments and biological substances status: Secondary | ICD-10-CM | POA: Diagnosis not present

## 2021-09-23 DIAGNOSIS — S37011A Minor contusion of right kidney, initial encounter: Secondary | ICD-10-CM

## 2021-09-23 DIAGNOSIS — R5383 Other fatigue: Secondary | ICD-10-CM | POA: Diagnosis not present

## 2021-09-23 DIAGNOSIS — N202 Calculus of kidney with calculus of ureter: Secondary | ICD-10-CM | POA: Diagnosis not present

## 2021-09-23 DIAGNOSIS — Z882 Allergy status to sulfonamides status: Secondary | ICD-10-CM

## 2021-09-23 DIAGNOSIS — R9431 Abnormal electrocardiogram [ECG] [EKG]: Secondary | ICD-10-CM | POA: Diagnosis not present

## 2021-09-23 DIAGNOSIS — B9620 Unspecified Escherichia coli [E. coli] as the cause of diseases classified elsewhere: Secondary | ICD-10-CM | POA: Diagnosis present

## 2021-09-23 DIAGNOSIS — S301XXA Contusion of abdominal wall, initial encounter: Secondary | ICD-10-CM | POA: Diagnosis not present

## 2021-09-23 DIAGNOSIS — E7800 Pure hypercholesterolemia, unspecified: Secondary | ICD-10-CM | POA: Diagnosis not present

## 2021-09-23 DIAGNOSIS — Z7982 Long term (current) use of aspirin: Secondary | ICD-10-CM

## 2021-09-23 LAB — COMPREHENSIVE METABOLIC PANEL
ALT: 10 U/L (ref 0–44)
AST: 13 U/L — ABNORMAL LOW (ref 15–41)
Albumin: 3.1 g/dL — ABNORMAL LOW (ref 3.5–5.0)
Alkaline Phosphatase: 89 U/L (ref 38–126)
Anion gap: 10 (ref 5–15)
BUN: 12 mg/dL (ref 8–23)
CO2: 25 mmol/L (ref 22–32)
Calcium: 8.5 mg/dL — ABNORMAL LOW (ref 8.9–10.3)
Chloride: 98 mmol/L (ref 98–111)
Creatinine, Ser: 1.01 mg/dL — ABNORMAL HIGH (ref 0.44–1.00)
GFR, Estimated: 58 mL/min — ABNORMAL LOW (ref >60)
Glucose, Bld: 214 mg/dL — ABNORMAL HIGH (ref 70–99)
Potassium: 3.5 mmol/L (ref 3.5–5.1)
Sodium: 133 mmol/L — ABNORMAL LOW (ref 135–145)
Total Bilirubin: 0.8 mg/dL (ref 0.3–1.2)
Total Protein: 7.9 g/dL (ref 6.5–8.1)

## 2021-09-23 LAB — URINALYSIS, ROUTINE W REFLEX MICROSCOPIC
Bilirubin Urine: NEGATIVE
Glucose, UA: NEGATIVE mg/dL
Ketones, ur: NEGATIVE mg/dL
Nitrite: POSITIVE — AB
Protein, ur: 100 mg/dL — AB
RBC / HPF: >50 RBC/hpf — ABNORMAL HIGH (ref 0–5)
Specific Gravity, Urine: 1.01 (ref 1.005–1.030)
WBC, UA: >50 WBC/hpf — ABNORMAL HIGH (ref 0–5)
pH: 6 (ref 5.0–8.0)

## 2021-09-23 LAB — CBC WITH DIFFERENTIAL/PLATELET
Abs Immature Granulocytes: 0.21 10*3/uL — ABNORMAL HIGH (ref 0.00–0.07)
Basophils Absolute: 0.1 10*3/uL (ref 0.0–0.1)
Basophils Relative: 1 %
Eosinophils Absolute: 0.1 10*3/uL (ref 0.0–0.5)
Eosinophils Relative: 1 %
HCT: 28.8 % — ABNORMAL LOW (ref 36.0–46.0)
Hemoglobin: 9.1 g/dL — ABNORMAL LOW (ref 12.0–15.0)
Immature Granulocytes: 2 %
Lymphocytes Relative: 12 %
Lymphs Abs: 1.4 10*3/uL (ref 0.7–4.0)
MCH: 27.7 pg (ref 26.0–34.0)
MCHC: 31.6 g/dL (ref 30.0–36.0)
MCV: 87.5 fL (ref 80.0–100.0)
Monocytes Absolute: 1.1 10*3/uL — ABNORMAL HIGH (ref 0.1–1.0)
Monocytes Relative: 9 %
Neutro Abs: 9.4 10*3/uL — ABNORMAL HIGH (ref 1.7–7.7)
Neutrophils Relative %: 75 %
Platelets: 506 10*3/uL — ABNORMAL HIGH (ref 150–400)
RBC: 3.29 MIL/uL — ABNORMAL LOW (ref 3.87–5.11)
RDW: 13.3 % (ref 11.5–15.5)
WBC: 12.4 10*3/uL — ABNORMAL HIGH (ref 4.0–10.5)
nRBC: 0 % (ref 0.0–0.2)

## 2021-09-23 LAB — MAGNESIUM: Magnesium: 2 mg/dL (ref 1.7–2.4)

## 2021-09-23 LAB — RESP PANEL BY RT-PCR (FLU A&B, COVID) ARPGX2
Influenza A by PCR: NEGATIVE
Influenza B by PCR: NEGATIVE
SARS Coronavirus 2 by RT PCR: NEGATIVE

## 2021-09-23 LAB — LIPASE, BLOOD: Lipase: 34 U/L (ref 11–51)

## 2021-09-23 MED ORDER — DILTIAZEM HCL ER COATED BEADS 120 MG PO CP24
120.0000 mg | ORAL_CAPSULE | Freq: Every day | ORAL | Status: DC
  Administered 2021-09-23 – 2021-09-27 (×5): 120 mg via ORAL
  Filled 2021-09-23 (×5): qty 1

## 2021-09-23 MED ORDER — ONDANSETRON HCL 4 MG/2ML IJ SOLN
4.0000 mg | Freq: Four times a day (QID) | INTRAMUSCULAR | Status: DC | PRN
Start: 2021-09-23 — End: 2021-09-27
  Administered 2021-09-24: 4 mg via INTRAVENOUS
  Filled 2021-09-23 (×2): qty 2

## 2021-09-23 MED ORDER — MONTELUKAST SODIUM 10 MG PO TABS
10.0000 mg | ORAL_TABLET | Freq: Every day | ORAL | Status: DC
  Administered 2021-09-23 – 2021-09-26 (×4): 10 mg via ORAL
  Filled 2021-09-23 (×4): qty 1

## 2021-09-23 MED ORDER — ACETAMINOPHEN 650 MG RE SUPP: 650.0000 mg | Freq: Four times a day (QID) | RECTAL | Status: DC | PRN

## 2021-09-23 MED ORDER — ASPIRIN EC 81 MG PO TBEC
81.0000 mg | DELAYED_RELEASE_TABLET | Freq: Every day | ORAL | Status: DC
  Administered 2021-09-23 – 2021-09-27 (×5): 81 mg via ORAL
  Filled 2021-09-23 (×5): qty 1

## 2021-09-23 MED ORDER — METHYLPREDNISOLONE SODIUM SUCC 40 MG IJ SOLR: 40.0000 mg | Freq: Once | INTRAMUSCULAR | Status: DC

## 2021-09-23 MED ORDER — ONDANSETRON HCL 4 MG/2ML IJ SOLN
4.0000 mg | Freq: Once | INTRAMUSCULAR | Status: AC
  Administered 2021-09-23: 4 mg via INTRAVENOUS
  Filled 2021-09-23: qty 2

## 2021-09-23 MED ORDER — SODIUM CHLORIDE 0.9 % IV SOLN
2.0000 g | Freq: Once | INTRAVENOUS | Status: AC
  Administered 2021-09-23: 2 g via INTRAVENOUS
  Filled 2021-09-23: qty 20

## 2021-09-23 MED ORDER — INSULIN ASPART 100 UNIT/ML IJ SOLN
0.0000 [IU] | Freq: Three times a day (TID) | INTRAMUSCULAR | Status: DC
  Administered 2021-09-24: 2 [IU] via SUBCUTANEOUS
  Administered 2021-09-24: 3 [IU] via SUBCUTANEOUS
  Administered 2021-09-25 – 2021-09-26 (×4): 2 [IU] via SUBCUTANEOUS
  Administered 2021-09-27: 3 [IU] via SUBCUTANEOUS

## 2021-09-23 MED ORDER — LIP MEDEX EX OINT
TOPICAL_OINTMENT | CUTANEOUS | Status: DC | PRN
  Filled 2021-09-23: qty 7

## 2021-09-23 MED ORDER — ONDANSETRON HCL 4 MG PO TABS: 4.0000 mg | ORAL_TABLET | Freq: Four times a day (QID) | ORAL | Status: DC | PRN

## 2021-09-23 MED ORDER — HYDROMORPHONE HCL 1 MG/ML IJ SOLN: 1.0000 mg | INTRAMUSCULAR | Status: DC | PRN

## 2021-09-23 MED ORDER — SODIUM CHLORIDE 0.9 % IV SOLN
2.0000 g | INTRAVENOUS | Status: DC
  Administered 2021-09-24 – 2021-09-25 (×2): 2 g via INTRAVENOUS
  Filled 2021-09-23 (×2): qty 20

## 2021-09-23 MED ORDER — POTASSIUM CHLORIDE IN NACL 20-0.9 MEQ/L-% IV SOLN
INTRAVENOUS | Status: DC
  Filled 2021-09-23 (×6): qty 1000

## 2021-09-23 MED ORDER — SODIUM CHLORIDE 0.9 % IV BOLUS
1000.0000 mL | Freq: Once | INTRAVENOUS | Status: AC
  Administered 2021-09-23: 1000 mL via INTRAVENOUS

## 2021-09-23 MED ORDER — MORPHINE SULFATE (PF) 2 MG/ML IV SOLN: 2.0000 mg | Freq: Once | INTRAVENOUS | Status: DC

## 2021-09-23 MED ORDER — ACETAMINOPHEN 325 MG PO TABS: 650.0000 mg | ORAL_TABLET | Freq: Four times a day (QID) | ORAL | Status: DC | PRN

## 2021-09-23 NOTE — ED Triage Notes (Addendum)
Pt reports kidney surgery with stent placement in February. Pt reports issues after surgery and started on Cipro for infection and reports she thinks she was allergic to it.  ? ?Pt c/o weakness and nausea. Reports emesis multiple times last night.  ?Pt c/o blood in the urine and temp of 100.0 last night.  ? ?Pt sent over by PCP for further work up.  ? ?A/Ox4 ?Pt 1 assist due to weakness.  ? ? ?

## 2021-09-23 NOTE — H&P (Signed)
?History and Physical  ? ? ?Patient: [name]:[id number] DOB: [birth year] ?DOA: 09/23/2021 ?DOS: the patient was seen and examined on 09/23/2021 ?PCP: [name], DO  ?Patient coming from: Home ? ?Chief Complaint: No chief complaint on file. ? ?HPI: [name] is a 76 y.o. female with medical history significant of acute hypoxemic respiratory failure, osteoarthritis of both knees, chronic atrial fibrillation, bilateral carpal tunnel syndrome, chronic diastolic heart failure, lumbosacral DDD, history of lumbar stenosis, history of postlaminectomy syndrome, first-degree AV block, history of nephrolithiasis, history of unspecified rib fracture, obesity, prediabetes, bilateral rotator cuff tears, history of UTI who underwent right uteroscopic stone manipulation for UPJ stone on 09/03/2021 who is coming to the emergency department due to right flank pain, generalized weakness, night sweats, chills, decreased appetite, multiple episodes of nausea/emesis and hematuria.  No sore throat, dyspnea, chest pain, palpitations, PND, orthopnea, polyuria, polydipsia, polyphagia or blurred vision. ? ?ED course: Initial vital signs were temperature 98.3 ?F, pulse 96, respiration 18, BP 166/89 mmHg O2 sat 99% on room air.  The patient received Zofran 4 mg IVP, ceftriaxone 2 g IVPB and 1000 mL of NS bolus. ? ?Lab work: Her urinalysis showed large hemoglobinuria, proteinuria 100 mg deciliter, positive nitrites and large leukocyte esterase.  Microscopic examination showed more than 50 RBCs, more than 50 WBC with many bacteria and presence of mucus and WBC clumps.  CBC is her white count 12.4, hemoglobin 9.1 g/dL platelets 506.  Magnesium and lipase were normal.  CMP with a sodium 133 mmol/L, the rest of the electrolytes are normal after calcium is corrected.  Glucose 214 and creatinine 1.01 mg/dL.  Albumin was 3.1 g/dL and AST 13 units/L.  The rest of the hepatic functions were normal. ? ?Imaging: CT renal study shows  interval development of large right renal hematoma measuring 10.3 x 9 x 8.6 cm which may be related to interval lithotripsy.  There was a large calculus seen at the right ureteropelvic junction which is not seen on this study.  No evidence of an intestinal obstruction, cystitis or pneumoperitoneum.  There is no hydronephrosis.  There was diverticulosis of the colon.  Please see images and full radiology report for further details. ? ?Review of Systems: As mentioned in the history of present illness. All other systems reviewed and are negative. ?Past Medical History:  ?Diagnosis Date  ? Acute hypoxemic respiratory failure ([place]) 10/24/2016  ? Arthritis   ? both knees  ? Atrial fibrillation, chronic ([place])   ? Carpal tunnel syndrome, bilateral   ? both hands go to sleep at times from elbows down  ? Chronic diastolic heart failure ([place])   ? DDD (degenerative disc disease), lumbosacral   ? Dyspnea   ? during chronic bronchitis, resolved at this time  ? First degree AV block   ? Hematuria 11/15/2017  ? History of kidney stones   ? History of rib fracture 03/2011  ? Lumbar stenosis   ? Obesity   ? Postlaminectomy syndrome   ? Pre-diabetes   ? Renal calculi   ? BILATERAL  ? Right ureteral stone   ? Rotator cuff tear, left   ? Rotator cuff tear, right   ? UTI (urinary tract infection)   ? ?Past Surgical History:  ?Procedure Laterality Date  ? ANTERIOR CERVICAL DECOMP/DISCECTOMY FUSION  03-18-2011  ? C3 -- C7  ? ANTERIOR LATERAL LUMBAR FUSION 4 LEVELS N/A 05/06/2018  ? Procedure: Lumbar one- two Lumbar two -three Lumbar  three-four Lumbar four-five Anterolateral  decompression/fusion/posterior bilateral laminectomy Lumbar four-five Lumbar five Sacral one/posterior percutaneous fixation/mazor;  Surgeon: Kristeen Miss, MD;  Location: [facility];  Service: Neurosurgery;  Laterality: N/A;  ? APPENDECTOMY    ? APPLICATION OF ROBOTIC ASSISTANCE FOR SPINAL PROCEDURE N/A 05/06/2018  ? Procedure: APPLICATION OF ROBOTIC ASSISTANCE FOR SPINAL  PROCEDURE;  Surgeon: Kristeen Miss, MD;  Location: [facility];  Service: Neurosurgery;  Laterality: N/A;  ? BILATERAL CARPAL TUNNEL RELEASE    ? 3 screws in right hand, thumb, middle finger, and index finger,   ? CARDIAC CATHETERIZATION N/A 01/16/2015  ? Procedure: Left Heart Cath and Coronary Angiography;  Surgeon: Belva Crome, MD;  Location: [facility];  Service: Cardiovascular;  Laterality: N/A;  ? CHOLECYSTECTOMY OPEN  1970'S  ? COLONOSCOPY  2018  ? CYSTOSCOPY WITH RETROGRADE PYELOGRAM, URETEROSCOPY AND STENT PLACEMENT N/A 02/13/2013  ? Procedure: CYSTOSCOPY WITH RETROGRADE PYELOGRAM, URETEROSCOPY AND LITHOTRIPSY ;  Surgeon: Claybon Jabs, MD;  Location: [facility];  Service: Urology;  Laterality: N/A;  ? CYSTOSCOPY WITH URETEROSCOPY AND STENT PLACEMENT Left 04/17/2019  ? Procedure: CYSTOSCOPY WITH LEFT  RETROGRADE PYELOGRAM, LEFT URETEROSCOPY, HOLMIUM LASER AND LEFT STENT PLACEMENT;  Surgeon: Kathie Rhodes, MD;  Location: [facility];  Service: Urology;  Laterality: Left;  ? CYSTOSCOPY/URETEROSCOPY/HOLMIUM LASER/STENT PLACEMENT Right 11/22/2017  ? Procedure: CYSTOSCOPY/URETEROSCOPY/RETROGRADE PYELOGRAM/HOLMIUM LASER/STENT PLACEMENT;  Surgeon: Kathie Rhodes, MD;  Location: [facility];  Service: Urology;  Laterality: Right;  ? EXTRACORPOREAL SHOCK WAVE LITHOTRIPSY Right 05-16-2012  ? FINGER ARTHROSCOPY WITH CARPOMETACARPEL ([facility]) ARTHROPLASTY    ? HOLMIUM LASER APPLICATION Right 12/18/1273  ? Procedure: HOLMIUM LASER APPLICATION;  Surgeon: Claybon Jabs, MD;  Location[facility];  Service: Urology;  Laterality: Right;  ? IR URETERAL STENT LEFT NEW ACCESS W/O SEP NEPHROSTOMY CATH  02/13/2019  ? KNEE ARTHROSCOPY Bilateral   ? LUMBAR LAMINECTOMY  X2  1960'S  ? LUMBAR PERCUTANEOUS PEDICLE SCREW 4 LEVEL N/A 05/06/2018  ? Procedure: LUMBAR PERCUTANEOUS PEDICLE SCREW LUMBAR ONE TO LUMBAR FIVE WITH MAZOR ;POSTERIOR LATERAL LAMINECTOMY LUMBAR FOUR-FIVE,FIVE-SACRAL ONE;  Surgeon:  Kristeen Miss, MD;  Location: [facility];  Service: Neurosurgery;  Laterality: N/A;  ? NASAL SEPTUM SURGERY  1970'S  ? NEPHROLITHOTOMY Left 02/20/2019  ? Procedure: NEPHROLITHOTOMY PERCUTANEOUS;  Surgeon: Kathie Rhodes, MD;  Location: [facility];  Service: Urology;  Laterality: Left;  ? SHOULDER OPEN ROTATOR CUFF REPAIR Bilateral LEFT 01-11-2001/   RIGHT 07-21-2002  ? TOTAL KNEE ARTHROPLASTY  04/13/2012  ? Procedure: TOTAL KNEE ARTHROPLASTY;  Surgeon: Gearlean Alf, MD;  Location: [facility];  Service: Orthopedics;  Laterality: Left;  ? TOTAL KNEE ARTHROPLASTY  08/08/2012  ? Procedure: TOTAL KNEE ARTHROPLASTY;  Surgeon: Gearlean Alf, MD;  Location: [facility];  Service: Orthopedics;  Laterality: Right;  ? TRANSTHORACIC ECHOCARDIOGRAM  03-29-2011  ? MODERATE LVH/ EF 60-65%/ MILD MR  ? URETEROSCOPY WITH HOLMIUM LASER LITHOTRIPSY Left 06/19/2019  ? Procedure: URETEROSCOPY WITH HOLMIUM LASER LITHOTRIPSY/INSERTION OF DOUBLE J STENT;  Surgeon: Kathie Rhodes, MD;  Location: [facility];  Service: Urology;  Laterality: Left;  ? VAGINAL HYSTERECTOMY  1960'S  ? ?Social History:  reports that she has never smoked. She has never used smokeless tobacco. She reports that she does not drink alcohol and does not use drugs. ? ?Allergies  ?Allergen Reactions  ? Contrast Media [Iodinated Contrast Media] Hives and Rash  ?  Omnipaque. ? ?Rash, itching  ? Cipro [Ciprofloxacin Hcl] Nausea And Vomiting  ?  Pt reports she thinks she is  allergic and reports its caused her nausea, anxious, and weakness. Pt unsure.  ? Doxycycline Nausea And Vomiting and Other (See Comments)  ? Gabapentin Other (See Comments)  ?  Too drowsy  ? Metformin Hcl Diarrhea and Other (See Comments)  ? Pregabalin Other (See Comments)  ?  Too drowsy  ? Semaglutide(0.25 Or 0.'5mg'$ -Dos) Other (See Comments)  ?  Stomach upset  ? Ancef [Cefazolin] Rash  ?  Rash to ancef vs omniopaue contrast  ? Augmentin [Amoxicillin-Pot Clavulanate] Itching, Nausea And Vomiting and Rash  ?  Did it involve  swelling of the face/tongue/throat, SOB, or low BP? Unknown ?Did it involve sudden or severe rash/hives, skin peeling, or any reaction on the inside of your mouth or nose? Yes ?Did you need to seek medical attention

## 2021-09-23 NOTE — ED Provider Notes (Signed)
?[facility] DEPT ?Provider Note ? ? ?CSN: [id number] ?Arrival date & time: 09/23/21  5366 ? ?  ? ?History ? ?No chief complaint on file. ? ? ?[name] is a 76 y.o. female. ? ?76 yo F with a chief complaint of fatigue.  The patient tells me that she had a procedure done on February 20 and since then she has felt very fatigued.  Limited oral intake some nausea but no vomiting low-grade fevers.  She was thought to have a urinary tract infection and was started on ciprofloxacin.  She feels like she has had a lot of side effects from that medication.  She has trouble taking medicine by mouth and so is having trouble taking her home nausea medicine.  She denies any chest pain or pressure.  Denies any abdominal pain.  Has had some hematuria.  She had a kidney stone that was greater than 1 cm that she had a laser performed to break it up.  She denies any significant flank pain. ? ?She had called her doctor to try and set up an appointment and they encouraged her to come to the emergency department for evaluation. ? ? ? ?  ? ?Home Medications ?Prior to Admission medications   ?Medication Sig Start Date End Date Taking? Authorizing Provider  ?acetaminophen (TYLENOL) 650 MG CR tablet Take 1,300 mg by mouth every 8 (eight) hours as needed for pain.    [provider]  ?aspirin EC 81 MG tablet Take 81 mg by mouth daily. Swallow whole.    [provider]  ?diclofenac sodium (VOLTAREN) 1 % GEL Apply 2 g topically 3 (three) times daily as needed (back pain).  10/14/16   [provider]  ?diltiazem (CARDIZEM CD) 120 MG 24 hr capsule TAKE 1 CAPSULE(120 MG) BY MOUTH DAILY 05/16/21   Belva Crome, MD  ?montelukast (SINGULAIR) 10 MG tablet Take 1 tablet (10 mg total) by mouth at bedtime. 05/20/18   Angiulli, Lavon Paganini, PA-C  ?Polyethyl Glycol-Propyl Glycol (SYSTANE) 0.4-0.3 % SOLN Place 1 drop into both eyes 3 (three) times daily as needed (dry/irritated eyes.). ?Patient not  taking: Reported on 03/14/2021    [provider]  ?   ? ?Allergies    ?Contrast media [iodinated contrast media], Cipro [ciprofloxacin hcl], Doxycycline, Ancef [cefazolin], Augmentin [amoxicillin-pot clavulanate], Sulfa antibiotics, and Tape   ? ?Review of Systems   ?Review of Systems ? ?Physical Exam ?Updated Vital Signs ?BP 129/71   Pulse 85   Temp 98.3 ?F (36.8 ?C) (Oral)   Resp 19   SpO2 99%  ?Physical Exam ?Vitals and nursing note reviewed.  ?Constitutional:   ?   General: She is not in acute distress. ?   Appearance: She is well-developed. She is not diaphoretic.  ?HENT:  ?   Head: Normocephalic and atraumatic.  ?Eyes:  ?   Pupils: Pupils are equal, round, and reactive to light.  ?Cardiovascular:  ?   Rate and Rhythm: Normal rate and regular rhythm.  ?   Heart sounds: No murmur heard. ?  No friction rub. No gallop.  ?Pulmonary:  ?   Effort: Pulmonary effort is normal.  ?   Breath sounds: No wheezing or rales.  ?Abdominal:  ?   General: There is no distension.  ?   Palpations: Abdomen is soft.  ?   Tenderness: There is abdominal tenderness.  ?   Comments: Mild diffuse abdominal discomfort.  Worse to the epigastrium.  ?Musculoskeletal:     ?  General: No tenderness.  ?   Cervical back: Normal range of motion and neck supple.  ?Skin: ?   General: Skin is warm and dry.  ?Neurological:  ?   Mental Status: She is alert and oriented to person, place, and time.  ?Psychiatric:     ?   Behavior: Behavior normal.  ? ? ?ED Results / Procedures / Treatments   ?Labs ?(all labs ordered are listed, but only abnormal results are displayed) ?Labs Reviewed  ?CBC WITH DIFFERENTIAL/PLATELET - Abnormal; Notable for the following components:  ?    Result Value  ? WBC 12.4 (*)   ? RBC 3.29 (*)   ? Hemoglobin 9.1 (*)   ? HCT 28.8 (*)   ? Platelets 506 (*)   ? Neutro Abs 9.4 (*)   ? Monocytes Absolute 1.1 (*)   ? Abs Immature Granulocytes 0.21 (*)   ? All other components within normal limits  ?COMPREHENSIVE METABOLIC  PANEL - Abnormal; Notable for the following components:  ? Sodium 133 (*)   ? Glucose, Bld 214 (*)   ? Creatinine, Ser 1.01 (*)   ? Calcium 8.5 (*)   ? Albumin 3.1 (*)   ? AST 13 (*)   ? GFR, Estimated 58 (*)   ? All other components within normal limits  ?URINALYSIS, ROUTINE W REFLEX MICROSCOPIC - Abnormal; Notable for the following components:  ? Color, Urine AMBER (*)   ? APPearance TURBID (*)   ? Hgb urine dipstick LARGE (*)   ? Protein, ur 100 (*)   ? Nitrite POSITIVE (*)   ? Leukocytes,Ua LARGE (*)   ? RBC / HPF >50 (*)   ? WBC, UA >50 (*)   ? Bacteria, UA MANY (*)   ? All other components within normal limits  ?RESP PANEL BY RT-PCR (FLU A&B, COVID) [id number]  ?URINE CULTURE  ?LIPASE, BLOOD  ?MAGNESIUM  ? ? ?EKG ?EKG Interpretation ? ?Date/Time:  Tuesday September 23 2021 09:06:02 EDT ?Ventricular Rate:  83 ?PR Interval:  241 ?QRS Duration: 112 ?QT Interval:  377 ?QTC Calculation: 443 ?R Axis:   71 ?Text Interpretation: Sinus rhythm Prolonged PR interval Borderline intraventricular conduction delay Borderline T abnormalities, anterior leads No significant change since last tracing Confirmed by Deno Etienne [phone]) on 09/23/2021 9:09:23 AM ? ?Radiology ?DG Chest Port 1 View ? ?Result Date: 09/23/2021 ?CLINICAL DATA:  Fatigue. EXAM: PORTABLE CHEST 1 VIEW COMPARISON:  05/06/2018 FINDINGS: 0924 hours. Low volume film. The cardio pericardial silhouette is enlarged. Retrocardiac collapse/consolidation noted with possible small left pleural effusion. Right lung clear. Bones are diffusely demineralized. Cervical fusion hardware evident. Telemetry leads overlie the chest. IMPRESSION: Retrocardiac collapse/consolidation with possible small left pleural effusion. Electronically Signed   By: Misty Stanley M.D.   On: 09/23/2021 09:32  ? ?CT Renal Stone Study ? ?Result Date: 09/23/2021 ?CLINICAL DATA:  Flank pain EXAM: CT ABDOMEN AND PELVIS WITHOUT CONTRAST TECHNIQUE: Multidetector CT imaging of the abdomen and pelvis was performed  following the standard protocol without IV contrast. RADIATION DOSE REDUCTION: This exam was performed according to the departmental dose-optimization program which includes automated exposure control, adjustment of the mA and/or kV according to patient size and/or use of iterative reconstruction technique. COMPARISON:  08/27/2021 FINDINGS: Lower chest: Subtle increased interstitial markings in the lower lung fields have not changed. There is small Bochdalek's hernia containing fat on the right side Hepatobiliary: No focal abnormality is seen in the liver. Gallbladder is not seen. Pancreas: No focal abnormality is seen.  Spleen: Unremarkable. Adrenals/Urinary Tract: Adrenals are unremarkable. There is no hydronephrosis. There are multiple calcific densities in the left kidney largest measuring 11 mm. There is linear soft tissue density adjacent to the posterolateral margin of left kidney possibly residual change from previous intervention such as percutaneous nephrostomy. There is interval appearance of large hematoma in the posterior aspect of right kidney measuring proximally 10.3 x 9 x 8.6 cm. Previously seen 11 mm calculus at the right ureteropelvic junction is not visualized in the current study. There is 7 mm calculus in the lower pole of right kidney. There is right perinephric stranding. Ureters not dilated. Urinary bladder is unremarkable. Stomach/Bowel: Stomach is unremarkable. Small bowel loops are not dilated. Appendix is not seen. There is incomplete distention of right colon. Multiple diverticula are seen in the sigmoid colon. There is no evidence of focal acute diverticulitis. Vascular/Lymphatic: Scattered arterial calcifications are seen. Reproductive: Uterus is not seen. Other: There is no ascites or pneumoperitoneum. Umbilical hernia containing fat is seen. Possible small bilateral inguinal hernias containing fat are noted. Musculoskeletal: There is surgical fusion from L1-S1 levels. IMPRESSION:  There is interval development of large right renal hematoma measuring 10.3 x 9 x 8.6 cm. This may be related to interval lithotripsy. Large calculus seen at the right ureteropelvic junction in the previous study is n

## 2021-09-23 NOTE — Consult Note (Signed)
Reason for Consult: 1 - Right Subcapsular Renal Hematoma; 2 - Urolithiasis - ; 3 - Bacteruria ? ?Referring Physician: Tennis Must MD ? ?[name] is an 76 y.o. female.  ? ?HPI:  ? ?1 - Right Subcapsular Renal Hematoma - s/p right ureteroscopic stone manipulation for UPJ stone 09/03/21. New  subcapsular hematoma noted on ER CT 09/2021 on eval flank pain, hematuria, malaise. Hgb 9.1, baseline 11.   ? ?2 - Urolithiasis - long h/o recurrent stones managed aggressively with Dr. Louis Meckel.  ? 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 [place] 2021 e. Coli sens rocephin, keflex. [place] 3/14 this admission pending.  ? ?Today "[name]" is seen in consultation for above. Rt renal hematoma and malaise. No abscesses or hydronephrosis. Her main complaint is weakness, malaise, GI upset.  ? ?Past Medical History:  ?Diagnosis Date  ? Acute hypoxemic respiratory failure ([place]) 10/24/2016  ? Arthritis   ? both knees  ? Atrial fibrillation, chronic ([place])   ? Carpal tunnel syndrome, bilateral   ? both hands go to sleep at times from elbows down  ? Chronic diastolic heart failure ([place])   ? DDD (degenerative disc disease), lumbosacral   ? Dyspnea   ? during chronic bronchitis, resolved at this time  ? First degree AV block   ? Hematuria 11/15/2017  ? History of kidney stones   ? History of rib fracture 03/2011  ? Lumbar stenosis   ? Obesity   ? Postlaminectomy syndrome   ? Pre-diabetes   ? Renal calculi   ? BILATERAL  ? Right ureteral stone   ? Rotator cuff tear, left   ? Rotator cuff tear, right   ? UTI (urinary tract infection)   ? ? ?Past Surgical History:  ?Procedure Laterality Date  ? ANTERIOR CERVICAL DECOMP/DISCECTOMY FUSION  03-18-2011  ? C3 -- C7  ? ANTERIOR LATERAL LUMBAR FUSION 4 LEVELS N/A 05/06/2018  ? Procedure: Lumbar one- two Lumbar two -three Lumbar  three-four Lumbar four-five Anterolateral decompression/fusion/posterior bilateral laminectomy Lumbar four-five Lumbar five Sacral one/posterior  percutaneous fixation/mazor;  Surgeon: Kristeen Miss, MD;  Location: [facility];  Service: Neurosurgery;  Laterality: N/A;  ? APPENDECTOMY    ? APPLICATION OF ROBOTIC ASSISTANCE FOR SPINAL PROCEDURE N/A 05/06/2018  ? Procedure: APPLICATION OF ROBOTIC ASSISTANCE FOR SPINAL PROCEDURE;  Surgeon: Kristeen Miss, MD;  Location: [facility] [facility];  Service: Neurosurgery;  Laterality: N/A;  ? BILATERAL CARPAL TUNNEL RELEASE    ? 3 screws in right hand, thumb, middle finger, and index finger,   ? CARDIAC CATHETERIZATION N/A 01/16/2015  ? Procedure: Left Heart Cath and Coronary Angiography;  Surgeon: Belva Crome, MD;  Location: [facility];  Service: Cardiovascular;  Laterality: N/A;  ? CHOLECYSTECTOMY OPEN  1970'S  ? COLONOSCOPY  2018  ? CYSTOSCOPY WITH RETROGRADE PYELOGRAM, URETEROSCOPY AND STENT PLACEMENT N/A 02/13/2013  ? Procedure: CYSTOSCOPY WITH RETROGRADE PYELOGRAM, URETEROSCOPY AND LITHOTRIPSY ;  Surgeon: Claybon Jabs, MD;  Location: [facility];  Service: Urology;  Laterality: N/A;  ? CYSTOSCOPY WITH URETEROSCOPY AND STENT PLACEMENT Left 04/17/2019  ? Procedure: CYSTOSCOPY WITH LEFT  RETROGRADE PYELOGRAM, LEFT URETEROSCOPY, HOLMIUM LASER AND LEFT STENT PLACEMENT;  Surgeon: Kathie Rhodes, MD;  Location: [facility];  Service: Urology;  Laterality: Left;  ? CYSTOSCOPY/URETEROSCOPY/HOLMIUM LASER/STENT PLACEMENT Right 11/22/2017  ? Procedure: CYSTOSCOPY/URETEROSCOPY/RETROGRADE PYELOGRAM/HOLMIUM LASER/STENT PLACEMENT;  Surgeon: Kathie Rhodes, MD;  Location: [facility];  Service: Urology;  Laterality: Right;  ? EXTRACORPOREAL SHOCK WAVE LITHOTRIPSY Right  05-16-2012  ? FINGER ARTHROSCOPY WITH CARPOMETACARPEL ([facility]) ARTHROPLASTY    ? HOLMIUM LASER APPLICATION Right 01/18/2955  ? Procedure: HOLMIUM LASER APPLICATION;  Surgeon: Claybon Jabs, MD;  Location: [facility];  Service: Urology;  Laterality: Right;  ? IR URETERAL STENT LEFT NEW ACCESS W/O SEP NEPHROSTOMY CATH  02/13/2019  ? KNEE ARTHROSCOPY  Bilateral   ? LUMBAR LAMINECTOMY  X2  1960'S  ? LUMBAR PERCUTANEOUS PEDICLE SCREW 4 LEVEL N/A 05/06/2018  ? Procedure: LUMBAR PERCUTANEOUS PEDICLE SCREW LUMBAR ONE TO LUMBAR FIVE WITH MAZOR ;POSTERIOR LATERAL LAMINECTOMY LUMBAR FOUR-FIVE,FIVE-SACRAL ONE;  Surgeon: Kristeen Miss, MD;  Location: [place];  Service: Neurosurgery;  Laterality: N/A;  ? NASAL SEPTUM SURGERY  1970'S  ? NEPHROLITHOTOMY Left 02/20/2019  ? Procedure: NEPHROLITHOTOMY PERCUTANEOUS;  Surgeon: Kathie Rhodes, MD;  Location: [facility];  Service: Urology;  Laterality: Left;  ? SHOULDER OPEN ROTATOR CUFF REPAIR Bilateral LEFT 01-11-2001/   RIGHT 07-21-2002  ? TOTAL KNEE ARTHROPLASTY  04/13/2012  ? Procedure: TOTAL KNEE ARTHROPLASTY;  Surgeon: Gearlean Alf, MD;  Location: [facility];  Service: Orthopedics;  Laterality: Left;  ? TOTAL KNEE ARTHROPLASTY  08/08/2012  ? Procedure: TOTAL KNEE ARTHROPLASTY;  Surgeon: Gearlean Alf, MD;  Location: [facility];  Service: Orthopedics;  Laterality: Right;  ? TRANSTHORACIC ECHOCARDIOGRAM  03-29-2011  ? MODERATE LVH/ EF 60-65%/ MILD MR  ? URETEROSCOPY WITH HOLMIUM LASER LITHOTRIPSY Left 06/19/2019  ? Procedure: URETEROSCOPY WITH HOLMIUM LASER LITHOTRIPSY/INSERTION OF DOUBLE J STENT;  Surgeon: Kathie Rhodes, MD;  Location: [facility];  Service: Urology;  Laterality: Left;  ? VAGINAL HYSTERECTOMY  1960'S  ? ? ?Family History  ?Problem Relation Age of Onset  ? Diabetes type II Mother   ? Diabetes Sister   ? Hypertension Sister   ? Lung cancer Father   ? Hypertension Brother   ? Diabetes Brother   ? Heart disease Brother   ? Diabetes Brother   ? Hypertension Brother   ? Heart disease Brother   ? ? ?Social History:  reports that she has never smoked. She has never used smokeless tobacco. She reports that she does not drink alcohol and does not use drugs. ? ?Allergies:  ?Allergies  ?Allergen Reactions  ? Contrast Media [Iodinated Contrast Media] Hives and Rash  ?  Omnipaque. ? ?Rash, itching  ? Cipro [Ciprofloxacin Hcl] Nausea And  Vomiting  ?  Pt reports she thinks she is allergic and reports its caused her nausea, anxious, and weakness. Pt unsure.  ? Doxycycline Nausea And Vomiting and Other (See Comments)  ? Gabapentin Other (See Comments)  ?  Too drowsy  ? Metformin Hcl Diarrhea and Other (See Comments)  ? Pregabalin Other (See Comments)  ?  Too drowsy  ? Semaglutide(0.25 Or 0.'5mg'$ -Dos) Other (See Comments)  ?  Stomach upset  ? Ancef [Cefazolin] Rash  ?  Rash to ancef vs omniopaue contrast  ? Augmentin [Amoxicillin-Pot Clavulanate] Itching, Nausea And Vomiting and Rash  ?  Did it involve swelling of the face/tongue/throat, SOB, or low BP? Unknown ?Did it involve sudden or severe rash/hives, skin peeling, or any reaction on the inside of your mouth or nose? Yes ?Did you need to seek medical attention at a hospital or doctor's office? No ?When did it last happen? 2020      ?If all above answers are ?NO?, may proceed with cephalosporin use. ?  ? Sulfa Antibiotics Itching, Nausea And Vomiting, Rash and Other (See Comments)  ? Tape Swelling  ?  Adhesive  Tape  ? ? ?Medications: I have reviewed the patient's current medications. ? ?Results for orders placed or performed during the hospital encounter of 09/23/21 (from the past 48 hour(s))  ?CBC with Differential     Status: Abnormal  ? Collection Time: 09/23/21  9:09 AM  ?Result Value Ref Range  ? WBC 12.4 (H) 4.0 - 10.5 K/uL  ? RBC 3.29 (L) 3.87 - 5.11 MIL/uL  ? Hemoglobin 9.1 (L) 12.0 - 15.0 g/dL  ? HCT 28.8 (L) 36.0 - 46.0 %  ? MCV 87.5 80.0 - 100.0 fL  ? MCH 27.7 26.0 - 34.0 pg  ? MCHC 31.6 30.0 - 36.0 g/dL  ? RDW 13.3 11.5 - 15.5 %  ? Platelets 506 (H) 150 - 400 K/uL  ? nRBC 0.0 0.0 - 0.2 %  ? Neutrophils Relative % 75 %  ? Neutro Abs 9.4 (H) 1.7 - 7.7 K/uL  ? Lymphocytes Relative 12 %  ? Lymphs Abs 1.4 0.7 - 4.0 K/uL  ? Monocytes Relative 9 %  ? Monocytes Absolute 1.1 (H) 0.1 - 1.0 K/uL  ? Eosinophils Relative 1 %  ? Eosinophils Absolute 0.1 0.0 - 0.5 K/uL  ? Basophils Relative 1 %  ?  Basophils Absolute 0.1 0.0 - 0.1 K/uL  ? Immature Granulocytes 2 %  ? Abs Immature Granulocytes 0.21 (H) 0.00 - 0.07 K/uL  ?  Comment: Performed at [facility], [place] [address] [place], [place]

## 2021-09-24 DIAGNOSIS — D62 Acute posthemorrhagic anemia: Secondary | ICD-10-CM | POA: Diagnosis not present

## 2021-09-24 LAB — GLUCOSE, CAPILLARY
Glucose-Capillary: 173 mg/dL — ABNORMAL HIGH (ref 70–99)
Glucose-Capillary: 99 mg/dL (ref 70–99)
Glucose-Capillary: 127 mg/dL — ABNORMAL HIGH (ref 70–99)
Glucose-Capillary: 142 mg/dL — ABNORMAL HIGH (ref 70–99)

## 2021-09-24 LAB — COMPREHENSIVE METABOLIC PANEL
ALT: 9 U/L (ref 0–44)
AST: 11 U/L — ABNORMAL LOW (ref 15–41)
Albumin: 2.7 g/dL — ABNORMAL LOW (ref 3.5–5.0)
Alkaline Phosphatase: 77 U/L (ref 38–126)
Anion gap: 7 (ref 5–15)
BUN: 11 mg/dL (ref 8–23)
CO2: 24 mmol/L (ref 22–32)
Calcium: 8.1 mg/dL — ABNORMAL LOW (ref 8.9–10.3)
Chloride: 105 mmol/L (ref 98–111)
Creatinine, Ser: 0.81 mg/dL (ref 0.44–1.00)
GFR, Estimated: >60 mL/min (ref >60)
Glucose, Bld: 174 mg/dL — ABNORMAL HIGH (ref 70–99)
Potassium: 3.9 mmol/L (ref 3.5–5.1)
Sodium: 136 mmol/L (ref 135–145)
Total Bilirubin: 0.5 mg/dL (ref 0.3–1.2)
Total Protein: 7 g/dL (ref 6.5–8.1)

## 2021-09-24 LAB — CBC
HCT: 25.1 % — ABNORMAL LOW (ref 36.0–46.0)
Hemoglobin: 7.9 g/dL — ABNORMAL LOW (ref 12.0–15.0)
MCH: 27.8 pg (ref 26.0–34.0)
MCHC: 31.5 g/dL (ref 30.0–36.0)
MCV: 88.4 fL (ref 80.0–100.0)
Platelets: 431 10*3/uL — ABNORMAL HIGH (ref 150–400)
RBC: 2.84 MIL/uL — ABNORMAL LOW (ref 3.87–5.11)
RDW: 13.3 % (ref 11.5–15.5)
WBC: 10.9 10*3/uL — ABNORMAL HIGH (ref 4.0–10.5)
nRBC: 0 % (ref 0.0–0.2)

## 2021-09-24 LAB — HEMOGLOBIN AND HEMATOCRIT, BLOOD
HCT: 27.5 % — ABNORMAL LOW (ref 36.0–46.0)
Hemoglobin: 8.6 g/dL — ABNORMAL LOW (ref 12.0–15.0)

## 2021-09-24 MED ORDER — MUSCLE RUB 10-15 % EX CREA
TOPICAL_CREAM | CUTANEOUS | Status: DC | PRN
  Administered 2021-09-24: 1 via TOPICAL
  Filled 2021-09-24: qty 85

## 2021-09-24 NOTE — Progress Notes (Addendum)
The ?PROGRESS NOTE ? ? ? ?[name]  WUJ:[id number] DOB: Oct 23, 1945 DOA: 09/23/2021 ?PCP: [name], DO  ? ? ? ?Brief Narrative:  ?76 y.o. WF PMHx acute hypoxemic respiratory failure, osteoarthritis of both knees, chronic atrial fibrillation, bilateral carpal tunnel syndrome, chronic diastolic heart failure, lumbosacral DDD, history of lumbar stenosis, history of postlaminectomy syndrome, first-degree AV block, history of nephrolithiasis, history of unspecified rib fracture, obesity, prediabetes, bilateral rotator cuff tears, history of UTI who underwent right uteroscopic stone manipulation for UPJ stone on 09/03/2021  ? ?Is coming to the emergency department due to right flank pain, generalized weakness, night sweats, chills, decreased appetite, multiple episodes of nausea/emesis and hematuria.  No sore throat, dyspnea, chest pain, palpitations, PND, orthopnea, polyuria, polydipsia, polyphagia or blurred vision. ? ? ?Subjective: ?A/O x4, negative abdominal pain, negative CVA pain.  Patient has been getting up to urinate and bathroom with minimal difficulty.  Does not want PureWick currently. ? ? ?Assessment & Plan: ?Covid vaccination; ?  ?Principal Problem: ?  Renal hematoma, right ?Active Problems: ?  Ureteral calculus, right ?  Chronic diastolic heart failure ([place]) ?  Obesity ?  Essential hypertension ?  Paroxysmal atrial fibrillation (HCC) ?  Prediabetes ?  Acute blood loss anemia ?  Acute UTI (urinary tract infection) ?  Complicated UTI (urinary tract infection) ? ? ?RIGHT Renal Hematoma ?-Secondary Nephrolithiasis/lithotripsy ?-3/15 Per husband and patient DID NOT receive lithotripsy but received laser treatment to break up stone with stent placement.  Unable to verify due to [facility] EMR being on viewable. ?-2/22 s/p right ureteroscopic stone manipulation for UPJ stone  ? ?Urolithiasis  ?- long h/o recurrent stones managed aggressively with Dr. Louis Meckel.  ? ?Acute UTI complicated  ?-9/56 normal  saline with 20 mEq of KCl 75 mL/h. ?-Complete 10-day course antibiotics: Currently Ceftriaxone 2 g IVPB daily. ?-Urology consulted . ? ?Acute blood loss anemia vs Hemodilution ?-3/14 patient's hemoglobin on admission 9.1 ?-3/15 serial H/H q 4hrs. ?Lab Results  ?Component Value Date  ? HGB 8.0 (L) 09/25/2021  ? HGB 8.6 (L) 09/24/2021  ? HGB 7.9 (L) 09/24/2021  ? HGB 9.1 (L) 09/23/2021  ? HGB 11.3 (L) 06/15/2019  ?  ? ?Chronic diastolic CHF ?-Strict in and out ?- Daily weight ?-3/15 decrease normal saline + KCl 20 mEq/L to 33m/hr ?- Diltiazem 120 mg daily. ?  ?Essential hypertension ?-See CHF ? ?  ?Paroxysmal atrial fibrillation (HCC) ?-No longer on anticoagulation. ?-See CHF ? ?Prediabetes ?- 3/15 hemoglobin A1c pending ?- 3/15 lipid panel pending ?  ?  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 ? ? ? ?DVT prophylaxis: SCD ?Code Status: Full ?Family Communication: 2/15 husband at bedside for discussion of plan of care all questions answered ?Status is: Inpatient ? ? ? ?Dispo: The patient is from: Home ?             Anticipated d/c is to: SNF? ?             Anticipated d/c date is: > 3 days ?             Patient currently medically unstable ? ? ? ? ? ?Consultants:  ?Urology Dr. TAlexis Frock? ? ? ?Procedures/Significant Events:  ?CT renal study shows interval development of large right renal hematoma measuring 10.3 x 9 x 8.6 cm which may be related to interval lithotripsy.  There was a large calculus seen at the right ureteropelvic junction which is not seen on this  study.  No evidence of an intestinal obstruction, cystitis or pneumoperitoneum.  There is no hydronephrosis.  There was diverticulosis of the colon.  Please see images and full radiology report for further details. ? ?I have personally reviewed and interpreted all radiology studies and my findings are as above. ? ?VENTILATOR SETTINGS: ? ? ? ?Cultures ?3/14 urine pending ? ? ? ?Antimicrobials: ?Anti-infectives (From admission, onward)   ? ? Start     Ordered Stop  ? 09/24/21 1230  cefTRIAXone (ROCEPHIN) 2 g in sodium chloride 0.9 % 100 mL IVPB       ? 09/23/21 1316    ? 09/23/21 1230  cefTRIAXone (ROCEPHIN) 2 g in sodium chloride 0.9 % 100 mL IVPB       ? 09/23/21 1224 09/23/21 1300  ? ?  ?  ? ? ?Devices ?  ? ?LINES / TUBES:  ? ? ? ? ?Continuous Infusions: ? 0.9 % NaCl with KCl 20 mEq / L 75 mL/hr at 09/24/21 2117  ? cefTRIAXone (ROCEPHIN)  IV Stopped (09/24/21 1900)  ? ? ? ?Objective: ?Vitals:  ? 09/24/21 0605 09/24/21 1317 09/24/21 2024 09/25/21 0609  ?BP: (!) 141/70 (!) 148/64 (!) 155/74 136/68  ?Pulse: 79 78 96 78  ?Resp: '20 19 20 20  '$ ?Temp: 98.1 ?F (36.7 ?C) 98.2 ?F (36.8 ?C) 98.1 ?F (36.7 ?C) 98.2 ?F (36.8 ?C)  ?TempSrc: Oral Oral Oral Oral  ?SpO2: 100% 100% 99% 98%  ?Weight:      ?Height:      ? ? ?Intake/Output Summary (Last 24 hours) at 09/25/2021 0807 ?Last data filed at 09/25/2021 0418 ?Gross per 24 hour  ?Intake 1850.35 ml  ?Output 2500 ml  ?Net -649.65 ml  ? ?Filed Weights  ? 09/23/21 1417  ?Weight: 108.8 kg  ? ? ?Examination: ? ?General: A/O x4, No acute respiratory distress ?Eyes: negative scleral hemorrhage, negative anisocoria, negative icterus ?ENT: Negative Runny nose, negative gingival bleeding, ?Neck:  Negative scars, masses, torticollis, lymphadenopathy, JVD ?Lungs: Clear to auscultation bilaterally without wheezes or crackles ?Cardiovascular: Regular rate and rhythm without murmur gallop or rub normal S1 and S2 ?Abdomen: OBESE, negative abdominal pain, nondistended, positive soft, bowel sounds, no rebound, no ascites, no appreciable mass ?Extremities: No significant cyanosis, clubbing, or edema bilateral lower extremities ?Skin: Negative rashes, lesions, ulcers ?Psychiatric:  Negative depression, negative anxiety, negative fatigue, negative mania  ?Central nervous system:  Cranial nerves II through XII intact, tongue/uvula midline, all extremities muscle strength 5/5, sensation intact throughout, negative dysarthria, negative  expressive aphasia, negative receptive aphasia. ? ?.  ? ? ? ?Data Reviewed: Care during the described time interval was provided by me .  I have reviewed this patient's available data, including medical history, events of note, physical examination, and all test results as part of my evaluation. ? ?CBC: ?Recent Labs  ?Lab 09/23/21 ?0909 09/24/21 ?0346 09/24/21 ?1949 09/25/21 ?9622  ?WBC 12.4* 10.9*  --  9.2  ?NEUTROABS 9.4*  --   --  7.1  ?HGB 9.1* 7.9* 8.6* 8.0*  ?HCT 28.8* 25.1* 27.5* 26.0*  ?MCV 87.5 88.4  --  90.0  ?PLT 506* 431*  --  426*  ? ?Basic Metabolic Panel: ?Recent Labs  ?Lab 09/23/21 ?0909 09/24/21 ?0346 09/25/21 ?2979  ?NA 133* 136 135  ?K 3.5 3.9 4.3  ?CL 98 105 104  ?CO2 '25 24 24  '$ ?GLUCOSE 214* 174* 185*  ?BUN '12 11 9  '$ ?CREATININE 1.01* 0.81 0.95  ?CALCIUM 8.5* 8.1* 8.0*  ?MG 2.0  --  1.8  ?PHOS  --   --  3.1  ? ?GFR: ?Estimated Creatinine Clearance: 58.5 mL/min (by C-G formula based on SCr of 0.95 mg/dL). ?Liver Function Tests: ?Recent Labs  ?Lab 09/23/21 ?0909 09/24/21 ?0346 09/25/21 ?6503  ?AST 13* 11* 9*  ?ALT '10 9 7  '$ ?ALKPHOS 89 77 73  ?BILITOT 0.8 0.5 0.3  ?PROT 7.9 7.0 7.2  ?ALBUMIN 3.1* 2.7* 2.7*  ? ?Recent Labs  ?Lab 09/23/21 ?0909  ?LIPASE 34  ? ?No results for input(s): AMMONIA in the last 168 hours. ?Coagulation Profile: ?No results for input(s): INR, PROTIME in the last 168 hours. ?Cardiac Enzymes: ?No results for input(s): CKTOTAL, CKMB, CKMBINDEX, TROPONINI in the last 168 hours. ?BNP (last 3 results) ?No results for input(s): PROBNP in the last 8760 hours. ?HbA1C: ?Recent Labs  ?  09/24/21 ?0346  ?HGBA1C 7.2*  ? ?CBG: ?Recent Labs  ?Lab 09/24/21 ?0730 09/24/21 ?1151 09/24/21 ?1641 09/24/21 ?2027 09/25/21 ?0732  ?GLUCAP 173* 99 127* 142* 149*  ? ?Lipid Profile: ?Recent Labs  ?  09/25/21 ?5465  ?CHOL 142  ?HDL 53  ?[place] 75  ?TRIG 70  ?CHOLHDL 2.7  ? ?Thyroid Function Tests: ?No results for input(s): TSH, T4TOTAL, FREET4, T3FREE, THYROIDAB in the last 72 hours. ?Anemia Panel: ?No  results for input(s): VITAMINB12, FOLATE, FERRITIN, TIBC, IRON, RETICCTPCT in the last 72 hours. ?Sepsis Labs: ?No results for input(s): PROCALCITON, LATICACIDVEN in the last 168 hours. ? ?Recent Results (f

## 2021-09-24 NOTE — TOC Initial Note (Signed)
Transition of Care (TOC) - Initial/Assessment Note  ? ? ?Patient Details  ?Name: [name] ?MRN: [id number] ?Date of Birth: 1946-07-02 ? ?Transition of Care (TOC) CM/SW Contact:    ?Tawanna Cooler, RN ?Phone Number: ?09/24/2021, 10:05 AM ? ?Clinical Narrative:                 ? ? ?Transition of Care (TOC) Screening Note ? ? ?Patient Details  ?Name: [name] ?Date of Birth: 10/20/1945 ? ? ?Transition of Care (TOC) CM/SW Contact:    ?Tawanna Cooler, RN ?Phone Number: ?09/24/2021, 10:06 AM ? ? ?Transition of Care Department [facility]) has reviewed patient and no TOC needs have been identified at this time. We will continue to monitor patient advancement through interdisciplinary progression rounds. If new patient transition needs arise, please place a TOC consult. ?  ? ?Expected Discharge Plan: Home/Self Care ?  ? ?Expected Discharge Plan and Services ?Expected Discharge Plan: Home/Self Care ?  ? ?Prior Living Arrangements/Services ?  ?Lives with:: Spouse ?Patient language and need for interpreter reviewed:: Yes ?       ?Need for Family Participation in Patient Care: Yes (Comment) ?Care giver support system in place?: Yes (comment) ?  ?Criminal Activity/Legal Involvement Pertinent to Current Situation/Hospitalization: No - Comment as needed ? ?Activities of Daily Living ?Home Assistive Devices/Equipment: Other (Comment) ?ADL Screening (condition at time of admission) ?Patient's cognitive ability adequate to safely complete daily activities?: Yes ?Is the patient deaf or have difficulty hearing?: No ?Does the patient have difficulty seeing, even when wearing glasses/contacts?: No ?Does the patient have difficulty concentrating, remembering, or making decisions?: No ?Patient able to express need for assistance with ADLs?: Yes ?Does the patient have difficulty dressing or bathing?: No ?Independently performs ADLs?: Yes (appropriate for developmental age) ?Does the patient have difficulty walking or climbing  stairs?: No ?Weakness of Legs: None ?Weakness of Arms/Hands: None ? ?  ?  ?Alcohol / Substance Use: Not Applicable ?Psych Involvement: No (comment) ? ?Admission diagnosis:  Pyelonephritis [N12] ?Renal hematoma, right [S37.011A] ?Perinephric hematoma [S37.019A] ?Patient Active Problem List  ? Diagnosis Date Noted  ? Renal hematoma, right 09/23/2021  ? Acute UTI (urinary tract infection) 09/23/2021  ? Allergic reaction 02/13/2019  ? Lumbar radiculopathy 05/12/2018  ? 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 Neurologic gait disorder   ? Chronic atrial fibrillation (HCC)   ? Chronic diastolic congestive heart failure ([place])   ? Prediabetes   ? Post-operative pain   ? History of fusion of cervical spine   ? Acute blood loss anemia   ? Leukocytosis   ? Degenerative scoliosis 05/06/2018  ? Scoliosis deformity of spine 05/06/2018  ? Respiratory failure ([place]) 10/24/2016  ? Anxiety 09/26/2016  ? Paroxysmal atrial fibrillation ([place]) 12/20/2014  ? Chronic anticoagulation 12/20/2014  ? Essential hypertension 07/20/2014  ? Obesity 05/04/2014  ? Chronic diastolic heart failure ([place]) 05/03/2014  ? Ureteral calculus, right 05/16/2012  ? Hydronephrosis of right kidney 05/16/2012  ? Renal calculus, left 05/16/2012  ? OA (osteoarthritis) of knee 04/13/2012  ? ?PCP:  [name], DO ?Pharmacy:   ?[facility] [place], [address] ?[place] ?[place] [id number] ?Phone: [phone] Fax: [phone] ? ? ? ?Readmission Risk Interventions ?No flowsheet data found. ? ? ?

## 2021-09-25 DIAGNOSIS — I5032 Chronic diastolic (congestive) heart failure: Secondary | ICD-10-CM | POA: Diagnosis not present

## 2021-09-25 DIAGNOSIS — S37019A Minor contusion of unspecified kidney, initial encounter: Secondary | ICD-10-CM

## 2021-09-25 DIAGNOSIS — I10 Essential (primary) hypertension: Secondary | ICD-10-CM

## 2021-09-25 DIAGNOSIS — N390 Urinary tract infection, site not specified: Secondary | ICD-10-CM | POA: Diagnosis present

## 2021-09-25 DIAGNOSIS — D62 Acute posthemorrhagic anemia: Secondary | ICD-10-CM | POA: Diagnosis not present

## 2021-09-25 DIAGNOSIS — N12 Tubulo-interstitial nephritis, not specified as acute or chronic: Secondary | ICD-10-CM | POA: Diagnosis not present

## 2021-09-25 LAB — LIPID PANEL
Cholesterol: 142 mg/dL (ref 0–200)
HDL: 53 mg/dL (ref >40)
LDL Cholesterol: 75 mg/dL (ref 0–99)
Total CHOL/HDL Ratio: 2.7 RATIO
Triglycerides: 70 mg/dL (ref <150)
VLDL: 14 mg/dL (ref 0–40)

## 2021-09-25 LAB — URINE CULTURE: Culture: >=100000 — AB

## 2021-09-25 LAB — CBC WITH DIFFERENTIAL/PLATELET
Abs Immature Granulocytes: 0.14 10*3/uL — ABNORMAL HIGH (ref 0.00–0.07)
Basophils Absolute: 0.1 10*3/uL (ref 0.0–0.1)
Basophils Relative: 1 %
Eosinophils Absolute: 0.2 10*3/uL (ref 0.0–0.5)
Eosinophils Relative: 2 %
HCT: 26 % — ABNORMAL LOW (ref 36.0–46.0)
Hemoglobin: 8 g/dL — ABNORMAL LOW (ref 12.0–15.0)
Immature Granulocytes: 2 %
Lymphocytes Relative: 10 %
Lymphs Abs: 1 10*3/uL (ref 0.7–4.0)
MCH: 27.7 pg (ref 26.0–34.0)
MCHC: 30.8 g/dL (ref 30.0–36.0)
MCV: 90 fL (ref 80.0–100.0)
Monocytes Absolute: 0.8 10*3/uL (ref 0.1–1.0)
Monocytes Relative: 9 %
Neutro Abs: 7.1 10*3/uL (ref 1.7–7.7)
Neutrophils Relative %: 76 %
Platelets: 426 10*3/uL — ABNORMAL HIGH (ref 150–400)
RBC: 2.89 MIL/uL — ABNORMAL LOW (ref 3.87–5.11)
RDW: 13.2 % (ref 11.5–15.5)
WBC: 9.2 10*3/uL (ref 4.0–10.5)
nRBC: 0 % (ref 0.0–0.2)

## 2021-09-25 LAB — COMPREHENSIVE METABOLIC PANEL
ALT: 7 U/L (ref 0–44)
AST: 9 U/L — ABNORMAL LOW (ref 15–41)
Albumin: 2.7 g/dL — ABNORMAL LOW (ref 3.5–5.0)
Alkaline Phosphatase: 73 U/L (ref 38–126)
Anion gap: 7 (ref 5–15)
BUN: 9 mg/dL (ref 8–23)
CO2: 24 mmol/L (ref 22–32)
Calcium: 8 mg/dL — ABNORMAL LOW (ref 8.9–10.3)
Chloride: 104 mmol/L (ref 98–111)
Creatinine, Ser: 0.95 mg/dL (ref 0.44–1.00)
GFR, Estimated: >60 mL/min (ref >60)
Glucose, Bld: 185 mg/dL — ABNORMAL HIGH (ref 70–99)
Potassium: 4.3 mmol/L (ref 3.5–5.1)
Sodium: 135 mmol/L (ref 135–145)
Total Bilirubin: 0.3 mg/dL (ref 0.3–1.2)
Total Protein: 7.2 g/dL (ref 6.5–8.1)

## 2021-09-25 LAB — HEMOGLOBIN AND HEMATOCRIT, BLOOD
HCT: 28.7 % — ABNORMAL LOW (ref 36.0–46.0)
HCT: 28 % — ABNORMAL LOW (ref 36.0–46.0)
HCT: 25.5 % — ABNORMAL LOW (ref 36.0–46.0)
HCT: 24.5 % — ABNORMAL LOW (ref 36.0–46.0)
Hemoglobin: 8.9 g/dL — ABNORMAL LOW (ref 12.0–15.0)
Hemoglobin: 8.6 g/dL — ABNORMAL LOW (ref 12.0–15.0)
Hemoglobin: 7.8 g/dL — ABNORMAL LOW (ref 12.0–15.0)
Hemoglobin: 7.6 g/dL — ABNORMAL LOW (ref 12.0–15.0)

## 2021-09-25 LAB — GLUCOSE, CAPILLARY
Glucose-Capillary: 149 mg/dL — ABNORMAL HIGH (ref 70–99)
Glucose-Capillary: 108 mg/dL — ABNORMAL HIGH (ref 70–99)
Glucose-Capillary: 132 mg/dL — ABNORMAL HIGH (ref 70–99)
Glucose-Capillary: 145 mg/dL — ABNORMAL HIGH (ref 70–99)

## 2021-09-25 LAB — HEMOGLOBIN A1C
Hgb A1c MFr Bld: 7.2 % — ABNORMAL HIGH (ref 4.8–5.6)
Mean Plasma Glucose: 160 mg/dL

## 2021-09-25 LAB — MAGNESIUM: Magnesium: 1.8 mg/dL (ref 1.7–2.4)

## 2021-09-25 LAB — PHOSPHORUS: Phosphorus: 3.1 mg/dL (ref 2.5–4.6)

## 2021-09-25 MED ORDER — CEFDINIR 125 MG/5ML PO SUSR: 300.0000 mg | Freq: Two times a day (BID) | ORAL | Status: DC

## 2021-09-25 MED ORDER — CEPHALEXIN 250 MG/5ML PO SUSR
500.0000 mg | Freq: Three times a day (TID) | ORAL | Status: DC
  Administered 2021-09-26 – 2021-09-27 (×4): 500 mg via ORAL
  Filled 2021-09-25 (×5): qty 10

## 2021-09-25 MED ORDER — TRAMADOL HCL 50 MG PO TABS
25.0000 mg | ORAL_TABLET | Freq: Once | ORAL | Status: AC
  Administered 2021-09-25: 25 mg via ORAL
  Filled 2021-09-25: qty 1

## 2021-09-25 NOTE — Progress Notes (Signed)
The ?PROGRESS NOTE ? ? ? ?[name]  SNK:[id number] DOB: 10/21/1945 DOA: 09/23/2021 ?PCP: [name], DO  ? ? ? ?Brief Narrative:  ?76 y.o. WF PMHx acute hypoxemic respiratory failure, osteoarthritis of both knees, chronic atrial fibrillation, bilateral carpal tunnel syndrome, chronic diastolic heart failure, lumbosacral DDD, history of lumbar stenosis, history of postlaminectomy syndrome, first-degree AV block, history of nephrolithiasis, history of unspecified rib fracture, obesity, prediabetes, bilateral rotator cuff tears, history of UTI who underwent right uteroscopic stone manipulation for UPJ stone on 09/03/2021  ? ?Is coming to the emergency department due to right flank pain, generalized weakness, night sweats, chills, decreased appetite, multiple episodes of nausea/emesis and hematuria.  No sore throat, dyspnea, chest pain, palpitations, PND, orthopnea, polyuria, polydipsia, polyphagia or blurred vision. ? ? ?Subjective: ?3/16 afebrile overnight A/O x4, patient still having some mild right-sided CVA tenderness. ? ? ?Assessment & Plan: ?Covid vaccination; ?  ?Principal Problem: ?  Renal hematoma, right ?Active Problems: ?  Ureteral calculus, right ?  Chronic diastolic heart failure ([place]) ?  Obesity ?  Essential hypertension ?  Paroxysmal atrial fibrillation (HCC) ?  Prediabetes ?  Acute blood loss anemia ?  Acute UTI (urinary tract infection) ?  Complicated UTI (urinary tract infection) ? ? ?RIGHT Renal Hematoma ?-Secondary Nephrolithiasis/lithotripsy ?-3/15 Per husband and patient DID NOT receive lithotripsy but received laser treatment to break up stone with stent placement.  Unable to verify due to [facility] EMR being on viewable. ?-2/22 s/p right ureteroscopic stone manipulation for UPJ stone  ? ?Urolithiasis  ?- long h/o recurrent stones managed aggressively with Dr. Louis Meckel.  ? ?Acute UTI complicated positive E Coli ?-3/15 normal saline with 20 mEq of KCl 75 mL/h. ?-Complete 10-day course  antibiotics: Currently Ceftriaxone 2 g IVPB daily. ?-Urology consulted . ?-3/16 discussed case with pharmacy we will start patient on Keflex 500 mg TID first dose 3/17 ? ?Acute blood loss anemia vs Hemodilution ?-3/14 patient's hemoglobin on admission 9.1 ?-3/15 serial H/H q 4hrs. ?Lab Results  ?Component Value Date  ? HGB 8.0 (L) 09/25/2021  ? HGB 8.6 (L) 09/24/2021  ? HGB 7.9 (L) 09/24/2021  ? HGB 9.1 (L) 09/23/2021  ? HGB 11.3 (L) 06/15/2019  ?-3/16 stable most likely hemodilution ? ?Chronic diastolic CHF ?-Strict in and out +1.3 L ?- Daily weight ?Filed Weights  ? 09/23/21 1417  ?Weight: 108.8 kg  ?- Diltiazem 120 mg daily. ?-3/16  decrease normal saline + KCl 20 mEq/L to [facility]  ? ?  ?Essential hypertension ?-See CHF ? ?  ?Paroxysmal atrial fibrillation (HCC) ?-No longer on anticoagulation. ?-See CHF ? ?Prediabetes ?- 3/15 hemoglobin A1c pending ?- 3/15 lipid panel pending ?  ?  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 ?Goals of care ?- 3/16 PT/OT consult; patient weak/debilitated, secondary to complex UTI evaluate for CIR vs SNF ? ? ? ? ?DVT prophylaxis: SCD ?Code Status: Full ?Family Communication: 2/15 husband at bedside for discussion of plan of care all questions answered ?Status is: Inpatient ? ? ? ?Dispo: The patient is from: Home ?             Anticipated d/c is to: SNF? ?             Anticipated d/c date is: > 3 days ?             Patient currently medically unstable ? ? ? ? ? ?Consultants:  ?Urology Dr. Alexis Frock ? ? ? ?Procedures/Significant Events:  ?  CT renal study shows interval development of large right renal hematoma measuring 10.3 x 9 x 8.6 cm which may be related to interval lithotripsy.  There was a large calculus seen at the right ureteropelvic junction which is not seen on this study.  No evidence of an intestinal obstruction, cystitis or pneumoperitoneum.  There is no hydronephrosis.  There was diverticulosis of the colon.  Please see images and full radiology report  for further details. ? ?I have personally reviewed and interpreted all radiology studies and my findings are as above. ? ?VENTILATOR SETTINGS: ? ? ? ?Cultures ?3/14 urine positive E Coli  ? ? ? ?Antimicrobials: ?Anti-infectives (From admission, onward)  ? ? Start     Ordered Stop  ? 09/24/21 1230  cefTRIAXone (ROCEPHIN) 2 g in sodium chloride 0.9 % 100 mL IVPB       ? 09/23/21 1316    ? 09/23/21 1230  cefTRIAXone (ROCEPHIN) 2 g in sodium chloride 0.9 % 100 mL IVPB       ? 09/23/21 1224 09/23/21 1300  ? ?  ?  ? ? ?Devices ?  ? ?LINES / TUBES:  ? ? ? ? ?Continuous Infusions: ? 0.9 % NaCl with KCl 20 mEq / L 75 mL/hr at 09/24/21 2117  ? cefTRIAXone (ROCEPHIN)  IV Stopped (09/24/21 1900)  ? ? ? ?Objective: ?Vitals:  ? 09/24/21 0605 09/24/21 1317 09/24/21 2024 09/25/21 0609  ?BP: (!) 141/70 (!) 148/64 (!) 155/74 136/68  ?Pulse: 79 78 96 78  ?Resp: '20 19 20 20  '$ ?Temp: 98.1 ?F (36.7 ?C) 98.2 ?F (36.8 ?C) 98.1 ?F (36.7 ?C) 98.2 ?F (36.8 ?C)  ?TempSrc: Oral Oral Oral Oral  ?SpO2: 100% 100% 99% 98%  ?Weight:      ?Height:      ? ? ?Intake/Output Summary (Last 24 hours) at 09/25/2021 0827 ?Last data filed at 09/25/2021 0418 ?Gross per 24 hour  ?Intake 1850.35 ml  ?Output 2500 ml  ?Net -649.65 ml  ? ? ?Filed Weights  ? 09/23/21 1417  ?Weight: 108.8 kg  ? ? ?Examination: ? ?General: A/O x4, No acute respiratory distress ?Eyes: negative scleral hemorrhage, negative anisocoria, negative icterus ?ENT: Negative Runny nose, negative gingival bleeding, ?Neck:  Negative scars, masses, torticollis, lymphadenopathy, JVD ?Lungs: Clear to auscultation bilaterally without wheezes or crackles ?Cardiovascular: Regular rate and rhythm without murmur gallop or rub normal S1 and S2 ?Abdomen: OBESE, negative abdominal pain, nondistended, positive soft, bowel sounds, no rebound, no ascites, no appreciable mass ?Extremities: No significant cyanosis, clubbing, or edema bilateral lower extremities ?Skin: Negative rashes, lesions, ulcers ?Psychiatric:   Negative depression, negative anxiety, negative fatigue, negative mania  ?Central nervous system:  Cranial nerves II through XII intact, tongue/uvula midline, all extremities muscle strength 5/5, sensation intact throughout, negative dysarthria, negative expressive aphasia, negative receptive aphasia. ? ?.  ? ? ? ?Data Reviewed: Care during the described time interval was provided by me .  I have reviewed this patient's available data, including medical history, events of note, physical examination, and all test results as part of my evaluation. ? ?CBC: ?Recent Labs  ?Lab 09/23/21 ?0909 09/24/21 ?0346 09/24/21 ?1949 09/25/21 ?2536  ?WBC 12.4* 10.9*  --  9.2  ?NEUTROABS 9.4*  --   --  7.1  ?HGB 9.1* 7.9* 8.6* 8.0*  ?HCT 28.8* 25.1* 27.5* 26.0*  ?MCV 87.5 88.4  --  90.0  ?PLT 506* 431*  --  426*  ? ? ?Basic Metabolic Panel: ?Recent Labs  ?Lab 09/23/21 ?6440 09/24/21 ?3474  09/25/21 ?8786  ?NA 133* 136 135  ?K 3.5 3.9 4.3  ?CL 98 105 104  ?CO2 '25 24 24  '$ ?GLUCOSE 214* 174* 185*  ?BUN '12 11 9  '$ ?CREATININE 1.01* 0.81 0.95  ?CALCIUM 8.5* 8.1* 8.0*  ?MG 2.0  --  1.8  ?PHOS  --   --  3.1  ? ? ?GFR: ?Estimated Creatinine Clearance: 58.5 mL/min (by C-G formula based on SCr of 0.95 mg/dL). ?Liver Function Tests: ?Recent Labs  ?Lab 09/23/21 ?0909 09/24/21 ?0346 09/25/21 ?7672  ?AST 13* 11* 9*  ?ALT '10 9 7  '$ ?ALKPHOS 89 77 73  ?BILITOT 0.8 0.5 0.3  ?PROT 7.9 7.0 7.2  ?ALBUMIN 3.1* 2.7* 2.7*  ? ? ?Recent Labs  ?Lab 09/23/21 ?0909  ?LIPASE 34  ? ? ?No results for input(s): AMMONIA in the last 168 hours. ?Coagulation Profile: ?No results for input(s): INR, PROTIME in the last 168 hours. ?Cardiac Enzymes: ?No results for input(s): CKTOTAL, CKMB, CKMBINDEX, TROPONINI in the last 168 hours. ?BNP (last 3 results) ?No results for input(s): PROBNP in the last 8760 hours. ?HbA1C: ?Recent Labs  ?  09/24/21 ?0346  ?HGBA1C 7.2*  ? ? ?CBG: ?Recent Labs  ?Lab 09/24/21 ?0730 09/24/21 ?1151 09/24/21 ?1641 09/24/21 ?2027 09/25/21 ?0732  ?GLUCAP 173*  99 127* 142* 149*  ? ? ?Lipid Profile: ?Recent Labs  ?  09/25/21 ?0947  ?CHOL 142  ?HDL 53  ?Graves 75  ?TRIG 70  ?CHOLHDL 2.7  ? ? ?Thyroid Function Tests: ?No results for input(s): TSH, T4TOTAL, FR

## 2021-09-26 DIAGNOSIS — E1169 Type 2 diabetes mellitus with other specified complication: Secondary | ICD-10-CM

## 2021-09-26 DIAGNOSIS — E785 Hyperlipidemia, unspecified: Secondary | ICD-10-CM | POA: Diagnosis present

## 2021-09-26 DIAGNOSIS — E7800 Pure hypercholesterolemia, unspecified: Secondary | ICD-10-CM

## 2021-09-26 DIAGNOSIS — E669 Obesity, unspecified: Secondary | ICD-10-CM

## 2021-09-26 DIAGNOSIS — E119 Type 2 diabetes mellitus without complications: Secondary | ICD-10-CM | POA: Diagnosis present

## 2021-09-26 LAB — MAGNESIUM: Magnesium: 1.9 mg/dL (ref 1.7–2.4)

## 2021-09-26 LAB — CBC WITH DIFFERENTIAL/PLATELET
Abs Immature Granulocytes: 0.16 10*3/uL — ABNORMAL HIGH (ref 0.00–0.07)
Basophils Absolute: 0.1 10*3/uL (ref 0.0–0.1)
Basophils Relative: 1 %
Eosinophils Absolute: 0.3 10*3/uL (ref 0.0–0.5)
Eosinophils Relative: 3 %
HCT: 28 % — ABNORMAL LOW (ref 36.0–46.0)
Hemoglobin: 8.5 g/dL — ABNORMAL LOW (ref 12.0–15.0)
Immature Granulocytes: 2 %
Lymphocytes Relative: 22 %
Lymphs Abs: 2.4 10*3/uL (ref 0.7–4.0)
MCH: 27.2 pg (ref 26.0–34.0)
MCHC: 30.4 g/dL (ref 30.0–36.0)
MCV: 89.7 fL (ref 80.0–100.0)
Monocytes Absolute: 0.9 10*3/uL (ref 0.1–1.0)
Monocytes Relative: 8 %
Neutro Abs: 7.2 10*3/uL (ref 1.7–7.7)
Neutrophils Relative %: 64 %
Platelets: 481 10*3/uL — ABNORMAL HIGH (ref 150–400)
RBC: 3.12 MIL/uL — ABNORMAL LOW (ref 3.87–5.11)
RDW: 13.4 % (ref 11.5–15.5)
WBC: 11 10*3/uL — ABNORMAL HIGH (ref 4.0–10.5)
nRBC: 0 % (ref 0.0–0.2)

## 2021-09-26 LAB — COMPREHENSIVE METABOLIC PANEL
ALT: 9 U/L (ref 0–44)
AST: 14 U/L — ABNORMAL LOW (ref 15–41)
Albumin: 2.9 g/dL — ABNORMAL LOW (ref 3.5–5.0)
Alkaline Phosphatase: 75 U/L (ref 38–126)
Anion gap: 9 (ref 5–15)
BUN: 12 mg/dL (ref 8–23)
CO2: 24 mmol/L (ref 22–32)
Calcium: 8.2 mg/dL — ABNORMAL LOW (ref 8.9–10.3)
Chloride: 103 mmol/L (ref 98–111)
Creatinine, Ser: 0.91 mg/dL (ref 0.44–1.00)
GFR, Estimated: >60 mL/min (ref >60)
Glucose, Bld: 183 mg/dL — ABNORMAL HIGH (ref 70–99)
Potassium: 3.9 mmol/L (ref 3.5–5.1)
Sodium: 136 mmol/L (ref 135–145)
Total Bilirubin: 0.3 mg/dL (ref 0.3–1.2)
Total Protein: 7.7 g/dL (ref 6.5–8.1)

## 2021-09-26 LAB — HEMOGLOBIN AND HEMATOCRIT, BLOOD
HCT: 28.1 % — ABNORMAL LOW (ref 36.0–46.0)
Hemoglobin: 9 g/dL — ABNORMAL LOW (ref 12.0–15.0)

## 2021-09-26 LAB — GLUCOSE, CAPILLARY
Glucose-Capillary: 133 mg/dL — ABNORMAL HIGH (ref 70–99)
Glucose-Capillary: 124 mg/dL — ABNORMAL HIGH (ref 70–99)
Glucose-Capillary: 109 mg/dL — ABNORMAL HIGH (ref 70–99)
Glucose-Capillary: 194 mg/dL — ABNORMAL HIGH (ref 70–99)

## 2021-09-26 LAB — PHOSPHORUS: Phosphorus: 4.2 mg/dL (ref 2.5–4.6)

## 2021-09-26 LAB — HEMOGLOBIN A1C
Hgb A1c MFr Bld: 7.2 % — ABNORMAL HIGH (ref 4.8–5.6)
Mean Plasma Glucose: 160 mg/dL

## 2021-09-26 MED ORDER — METFORMIN HCL 500 MG PO TABS: 500.0000 mg | ORAL_TABLET | Freq: Two times a day (BID) | ORAL | Status: DC

## 2021-09-26 MED ORDER — ATORVASTATIN CALCIUM 40 MG PO TABS
40.0000 mg | ORAL_TABLET | Freq: Every day | ORAL | Status: DC
Start: 2021-09-26 — End: 2021-09-27
  Filled 2021-09-26 (×2): qty 1

## 2021-09-26 MED ORDER — LISINOPRIL 5 MG PO TABS
2.5000 mg | ORAL_TABLET | Freq: Every day | ORAL | Status: DC
  Filled 2021-09-26 (×2): qty 1

## 2021-09-26 NOTE — Progress Notes (Signed)
The ?PROGRESS NOTE ? ? ? ?[name]  KDX:[id number] DOB: February 12, 1946 DOA: 09/23/2021 ?PCP: [name], DO  ? ? ? ?Brief Narrative:  ?76 y.o. WF PMHx acute hypoxemic respiratory failure, osteoarthritis of both knees, chronic atrial fibrillation, bilateral carpal tunnel syndrome, chronic diastolic heart failure, lumbosacral DDD, history of lumbar stenosis, history of postlaminectomy syndrome, first-degree AV block, history of nephrolithiasis, history of unspecified rib fracture, obesity, prediabetes, bilateral rotator cuff tears, history of UTI who underwent right uteroscopic stone manipulation for UPJ stone on 09/03/2021  ? ?Is coming to the emergency department due to right flank pain, generalized weakness, night sweats, chills, decreased appetite, multiple episodes of nausea/emesis and hematuria.  No sore throat, dyspnea, chest pain, palpitations, PND, orthopnea, polyuria, polydipsia, polyphagia or blurred vision. ? ? ?Subjective: ?3/17 afebrile overnight A/O x4.  CVA tenderness resolving ? ? ?Assessment & Plan: ?Covid vaccination; ?  ?Principal Problem: ?  Renal hematoma, right ?Active Problems: ?  Ureteral calculus, right ?  Chronic diastolic heart failure ([place]) ?  Obesity ?  Essential hypertension ?  Paroxysmal atrial fibrillation (HCC) ?  Prediabetes ?  Acute blood loss anemia ?  Acute UTI (urinary tract infection) ?  Complicated UTI (urinary tract infection) ?  Diabetes mellitus type 2 in obese [facility]) ?  HLD (hyperlipidemia) ? ? ?RIGHT Renal Hematoma ?-Secondary Nephrolithiasis/lithotripsy ?-3/15 Per husband and patient DID NOT receive lithotripsy but received laser treatment to break up stone with stent placement.  Unable to verify due to [facility] EMR being on viewable. ?-2/22 s/p right ureteroscopic stone manipulation for UPJ stone  ? ?Urolithiasis  ?- long h/o recurrent stones managed aggressively with Dr. Louis Meckel.  ? ?Acute UTI complicated positive E Coli ?-3/15 normal saline with 20 mEq of KCl  75 mL/h. ?-Complete 10-day course antibiotics: Currently Ceftriaxone 2 g IVPB daily. ?-Urology consulted . ?-3/16 discussed case with pharmacy we will start patient on Keflex 500 mg TID first dose 3/17 ? ?Acute blood loss anemia vs Hemodilution ?-3/14 patient's hemoglobin on admission 9.1 ?-3/15 serial H/H q 4hrs. ?Lab Results  ?Component Value Date  ? HGB 9.0 (L) 09/26/2021  ? HGB 8.5 (L) 09/26/2021  ? HGB 7.6 (L) 09/25/2021  ? HGB 7.8 (L) 09/25/2021  ? HGB 8.6 (L) 09/25/2021  ?-3/16 stable most likely hemodilution ? ?Chronic diastolic CHF ?-Strict in and out +1.3 L ?- Daily weight ?Filed Weights  ? 09/23/21 1417 09/26/21 0500  ?Weight: 108.8 kg 110 kg  ?- Diltiazem 120 mg daily. ?-3/17 lisinopril 2.5 mg daily ?-3/16  decrease normal saline + KCl 20 mEq/L to [facility]  ? ?  ?Essential hypertension ?-See CHF ? ?  ?Paroxysmal atrial fibrillation (HCC) ?-No longer on anticoagulation. ?-See CHF ? 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  Patient to follow-up with PCP newly diagnosed diabetic. ? ?HLD ?- 3/16 LDL = 75: Goal LDL <70 ?-3/17 Lipitor 40 mg daily ?  ?  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 ?Goals of care ?- 3/16 PT/OT consult; patient weak/debilitated, secondary to complex UTI evaluate for CIR vs SNF ? ? ? ? ?DVT prophylaxis: SCD ?Code Status: Full ?Family Communication: 2/17 husband at bedside for discussion of plan of care all questions answered ?Status is: Inpatient ? ? ? ?Dispo: The patient is from: Home ?             Anticipated d/c is to: SNF? ?  Anticipated d/c date is: > 3 days ?             Patient currently medically unstable ? ? ? ? ? ?Consultants:  ?Urology Dr. Alexis Frock ? ? ? ?Procedures/Significant Events:  ?CT renal study shows interval development of large right renal hematoma measuring 10.3 x 9 x 8.6 cm which may be related to interval lithotripsy.  There was a large calculus seen at the right ureteropelvic junction which is  not seen on this study.  No evidence of an intestinal obstruction, cystitis or pneumoperitoneum.  There is no hydronephrosis.  There was diverticulosis of the colon.  Please see images and full radiology report for further details. ? ?I have personally reviewed and interpreted all radiology studies and my findings are as above. ? ?VENTILATOR SETTINGS: ? ? ? ?Cultures ?3/14 urine positive E Coli  ? ? ? ?Antimicrobials: ?Anti-infectives (From admission, onward)  ? ? Start     Ordered Stop  ? 09/26/21 1000  cefdinir (OMNICEF) 125 MG/5ML suspension 300 mg  Status:  Discontinued       ? 09/25/21 1349 09/25/21 1449  ? 09/26/21 0600  cephALEXin (KEFLEX) 250 MG/5ML suspension 500 mg       ? 09/25/21 1449 10/03/21 0559  ? 09/24/21 1230  cefTRIAXone (ROCEPHIN) 2 g in sodium chloride 0.9 % 100 mL IVPB  Status:  Discontinued       ? 09/23/21 1316 09/25/21 1349  ? 09/23/21 1230  cefTRIAXone (ROCEPHIN) 2 g in sodium chloride 0.9 % 100 mL IVPB       ? 09/23/21 1224 09/23/21 1300  ? ?  ?  ?  ? ? ?Devices ?  ? ?LINES / TUBES:  ? ? ? ? ?Continuous Infusions: ? ? ? ? ?Objective: ?Vitals:  ? 09/25/21 2133 09/26/21 0500 09/26/21 0550 09/26/21 0958  ?BP: (!) 153/73  (!) 151/74 (!) 158/70  ?Pulse: 88  80   ?Resp: 18  16   ?Temp: 98.1 ?F (36.7 ?C)  97.8 ?F (36.6 ?C)   ?TempSrc: Oral  Oral   ?SpO2: 98%  97%   ?Weight:  110 kg    ?Height:      ? ? ?Intake/Output Summary (Last 24 hours) at 09/26/2021 1837 ?Last data filed at 09/26/2021 1300 ?Gross per 24 hour  ?Intake 580.07 ml  ?Output 1200 ml  ?Net -619.93 ml  ? ?Filed Weights  ? 09/23/21 1417 09/26/21 0500  ?Weight: 108.8 kg 110 kg  ? ? ?Examination: ? ?General: A/O x4, No acute respiratory distress ?Eyes: negative scleral hemorrhage, negative anisocoria, negative icterus ?ENT: Negative Runny nose, negative gingival bleeding, ?Neck:  Negative scars, masses, torticollis, lymphadenopathy, JVD ?Lungs: Clear to auscultation bilaterally without wheezes or crackles ?Cardiovascular: Regular rate  and rhythm without murmur gallop or rub normal S1 and S2 ?Abdomen: OBESE, negative abdominal pain, nondistended, positive soft, bowel sounds, no rebound, no ascites, no appreciable mass ?Extremities: No significant cyanosis, clubbing, or edema bilateral lower extremities ?Skin: Negative rashes, lesions, ulcers ?Psychiatric:  Negative depression, negative anxiety, negative fatigue, negative mania  ?Central nervous system:  Cranial nerves II through XII intact, tongue/uvula midline, all extremities muscle strength 5/5, sensation intact throughout, negative dysarthria, negative expressive aphasia, negative receptive aphasia. ? ?.  ? ? ? ?Data Reviewed: Care during the described time interval was provided by me .  I have reviewed this patient's available data, including medical history, events of note, physical examination, and all test results as part of my evaluation. ? ?CBC: ?Recent  Labs  ?Lab 09/23/21 ?0909 09/24/21 ?0346 09/24/21 ?1949 09/25/21 ?5170 09/25/21 ?0174 09/25/21 ?1346 09/25/21 ?1726 09/25/21 ?2116 09/26/21 ?0127 09/26/21 ?9449  ?WBC 12.4* 10.9*  --  9.2  --   --   --   --  11.0*  --   ?NEUTROABS 9.4*  --   --  7.1  --   --   --   --  7.2  --   ?HGB 9.1* 7.9*   < > 8.0*   < > 8.6* 7.8* 7.6* 8.5* 9.0*  ?HCT 28.8* 25.1*   < > 26.0*   < > 28.0* 25.5* 24.5* 28.0* 28.1*  ?MCV 87.5 88.4  --  90.0  --   --   --   --  89.7  --   ?PLT 506* 431*  --  426*  --   --   --   --  481*  --   ? < > = values in this interval not displayed.  ? ?Basic Metabolic Panel: ?Recent Labs  ?Lab 09/23/21 ?0909 09/24/21 ?0346 09/25/21 ?6759 09/26/21 ?0127  ?NA 133* 136 135 136  ?K 3.5 3.9 4.3 3.9  ?CL 98 105 104 103  ?CO2 '25 24 24 24  '$ ?GLUCOSE 214* 174* 185* 183*  ?BUN '12 11 9 12  '$ ?CREATININE 1.01* 0.81 0.95 0.91  ?CALCIUM 8.5* 8.1* 8.0* 8.2*  ?MG 2.0  --  1.8 1.9  ?PHOS  --   --  3.1 4.2  ? ?GFR: ?Estimated Creatinine Clearance: 61.5 mL/min (by C-G formula based on SCr of 0.91 mg/dL). ?Liver Function Tests: ?Recent Labs  ?Lab  09/23/21 ?0909 09/24/21 ?0346 09/25/21 ?1638 09/26/21 ?0127  ?AST 13* 11* 9* 14*  ?ALT '10 9 7 9  '$ ?ALKPHOS 89 77 73 75  ?BILITOT 0.8 0.5 0.3 0.3  ?PROT 7.9 7.0 7.2 7.7  ?ALBUMIN 3.1* 2.7* 2.7* 2.9*  ? ?Recent

## 2021-09-26 NOTE — Evaluation (Signed)
Occupational Therapy Evaluation ?Patient Details ?Name: [name] ?MRN: [id number] ?DOB: November 02, 1945 ?Today's Date: 09/26/2021 ? ? ?History of Present Illness Patient is a 76 year old female coming to the emergency department due to right flank pain, generalized weakness, night sweats, chills, decreased appetite, multiple episodes of nausea/emesis and hematuria. PMH: acute hypoxemic respiratory failure, osteoarthritis of both knees, chronic atrial fibrillation, bilateral carpal tunnel syndrome, chronic diastolic heart failure, lumbosacral DDD, history of lumbar stenosis, history of postlaminectomy syndrome, first-degree AV block, history of nephrolithiasis, history of unspecified rib fracture, obesity, prediabetes, bilateral rotator cuff tears, history of UTI who underwent right uteroscopic stone manipulation for UPJ stone on 09/03/2021  ? ?Clinical Impression ?  ?Patient lives at home with spouse and reports she is primarily independent with self care tasks. Since spinal fusion she reports difficulty with perianal care after bowel movement, and she uses a toileting aid for hygiene. Also has spouse assist with socks, otherwise can dress herself. Patient demonstrates ability to perform functional transfers and self care tasks at her baseline. Encourage her to continue ambulation in the room/up to chair during hospitalization. No further acute OT needs, will sign off.   ?   ? ?Recommendations for follow up therapy are one component of a multi-disciplinary discharge planning process, led by the attending physician.  Recommendations may be updated based on patient status, additional functional criteria and insurance authorization.  ? ?Follow Up Recommendations ? No OT follow up  ?  ?Assistance Recommended at Discharge PRN  ?   ?Functional Status Assessment ? Patient has not had a recent decline in their functional status  ?Equipment Recommendations ? None recommended by OT  ?  ?   ?Precautions / Restrictions  Restrictions ?Weight Bearing Restrictions: No  ? ?  ? ?Mobility Bed Mobility ?Overal bed mobility: Modified Independent ?  ?  ?  ?  ?  ?  ?  ?  ? ?Transfers ?Overall transfer level: Modified independent ?  ?  ?  ?  ?  ?  ?  ?  ?General transfer comment: increased time ?  ? ?  ?Balance Overall balance assessment: Modified Independent ?  ?  ?  ?  ?  ?  ?  ?  ?  ?  ?  ?  ?  ?  ?  ?  ?  ?  ?   ? ?ADL either performed or assessed with clinical judgement  ? ?ADL Overall ADL's : At baseline ?  ?  ?  ?  ?  ?  ?  ?  ?  ?  ?  ?  ?  ?  ?  ?  ?  ?  ?  ?General ADL Comments: Patient was able to perform functional transfers and bathing task needing assist only for washing her buttock. She uses adaptive equipment at home for this including perianal care due to hx of spinal fusion. Otherwise patient did not need assist for self care/transfers.  ? ? ? ? ?Pertinent Vitals/Pain Pain Assessment ?Pain Assessment: Faces ?Faces Pain Scale: Hurts little more ?Pain Location: abdomen ?Pain Descriptors / Indicators: Burning ?Pain Intervention(s): Monitored during session  ? ? ? ?Hand Dominance Right ?  ?Extremity/Trunk Assessment Upper Extremity Assessment ?Upper Extremity Assessment: Overall WFL for tasks assessed ?  ?Lower Extremity Assessment ?Lower Extremity Assessment: Defer to PT evaluation ?  ?Cervical / Trunk Assessment ?Cervical / Trunk Assessment: Normal ?  ?Communication Communication ?Communication: No difficulties ?  ?Cognition Arousal/Alertness: Awake/alert ?Behavior During Therapy: [facility] for  tasks assessed/performed ?Overall Cognitive Status: Within Functional Limits for tasks assessed ?  ?  ?  ?  ?  ?  ?  ?  ?  ?  ?  ?  ?  ?  ?  ?  ?  ?  ?  ?   ?   ?   ? ? ?Home Living Family/patient expects to be discharged to:: Private residence ?Living Arrangements: Spouse/significant other ?Available Help at Discharge: Family;Available 24 hours/day ?Type of Home: House ?  ?  ?  ?  ?  ?  ?  ?  ?  ?  ?  ?Home Equipment: Adaptive  equipment ?Adaptive Equipment:  (toileting aid) ?  ?  ? ?  ?Prior Functioning/Environment Prior Level of Function : Needs assist ?  ?  ?  ?  ?  ?  ?Mobility Comments: Ambulates with cane sometimes ?ADLs Comments: Patient reports she is mostly independent except since her back surgery she needs assist to don socks and has a toileting aid for perianal care after BM. ?  ? ?  ?  ?OT Problem List: Pain ?  ?   ?   ?OT Goals(Current goals can be found in the care plan section) Acute Rehab OT Goals ?Patient Stated Goal: Home with spouse ?OT Goal Formulation: All assessment and education complete, DC therapy  ? ?AM-PAC OT "6 Clicks" Daily Activity     ?Outcome Measure Help from another person eating meals?: None ?Help from another person taking care of personal grooming?: None ?Help from another person toileting, which includes using toliet, bedpan, or urinal?: A Little ?Help from another person bathing (including washing, rinsing, drying)?: A Little ?Help from another person to put on and taking off regular upper body clothing?: None ?Help from another person to put on and taking off regular lower body clothing?: A Little ?6 Click Score: 21 ?  ?End of Session Nurse Communication: Mobility status ? ?Activity Tolerance: Patient tolerated treatment well ?Patient left: in chair;with call bell/phone within reach ? ?OT Visit Diagnosis: Pain ?Pain - part of body:  (abdomen/bladder)  ?              ?Time: 9371-6967 ?OT Time Calculation (min): 17 min ?Charges:  OT General Charges ?$OT Visit: 1 Visit ?OT Evaluation ?$OT Eval Low Complexity: 1 Low ? ?Delbert Phenix OT ?OT pager: [phone] ? ?Rosemary Holms ?09/26/2021, 2:10 PM ?

## 2021-09-26 NOTE — Care Management Important Message (Signed)
Important Message ? ?Patient Details IM Letter given to the Patient. ?Name: [name] ?MRN: [id number] ?Date of Birth: [birth year] ? ? ?Medicare Important Message Given:  Yes ? ? ? ? ?Kerin Salen ?09/26/2021, 12:59 PM ?

## 2021-09-26 NOTE — Progress Notes (Signed)
PT Cancellation Note ? ?Patient Details ?Name: [name] ?MRN: [id number] ?DOB: 1945/11/09 ? ? ?Cancelled Treatment:    Reason Eval/Treat Not Completed: PT screened, no needs identified, will sign off. Pt is at modI level per OT, no needs for mobility. If there is a change in functional status, please re-consult. Thank you. ? ?Coolidge Breeze, PT, DPT ?[facility] Department ?Office: [phone] ?Pager: [phone] ?Coolidge Breeze ?09/26/2021, 4:02 PM ?

## 2021-09-26 NOTE — Progress Notes (Signed)
Urology Inpatient Progress Report ? ?Pyelonephritis [N12] ?Renal hematoma, right [S37.011A] ?Perinephric hematoma [S37.019A] ?  ?E. coli UTI ? ?Intv/Subj: ?No acute events overnight. ?Patient is without complaint. ?The patient was transition to liquid Keflex today, and appears to be tolerating that well.  She is feeling better, her hemoglobin is trending up. ? ?Principal Problem: ?  Renal hematoma, right ?Active Problems: ?  Ureteral calculus, right ?  Chronic diastolic heart failure ([place]) ?  Obesity ?  Essential hypertension ?  Paroxysmal atrial fibrillation (HCC) ?  Prediabetes ?  Acute blood loss anemia ?  Acute UTI (urinary tract infection) ?  Complicated UTI (urinary tract infection) ? ?Current Facility-Administered Medications  ?Medication Dose Route Frequency Provider Last Rate Last Admin  ? acetaminophen (TYLENOL) tablet 650 mg  650 mg Oral Q6H PRN Reubin Milan, MD      ? Or  ? acetaminophen (TYLENOL) suppository 650 mg  650 mg Rectal Q6H PRN Reubin Milan, MD      ? aspirin EC tablet 81 mg  81 mg Oral Daily Reubin Milan, MD   81 mg at 09/26/21 7741  ? cephALEXin (KEFLEX) 250 MG/5ML suspension 500 mg  500 mg Oral Q8H Allie Bossier, MD   500 mg at 09/26/21 0547  ? diltiazem (CARDIZEM CD) 24 hr capsule 120 mg  120 mg Oral Daily Reubin Milan, MD   120 mg at 09/26/21 2878  ? HYDROmorphone (DILAUDID) injection 1 mg  1 mg Intravenous Q4H PRN Reubin Milan, MD      ? insulin aspart (novoLOG) injection 0-15 Units  0-15 Units Subcutaneous TID WC Reubin Milan, MD   2 Units at 09/26/21 1221  ? lip balm (CARMEX) ointment   Topical PRN Reubin Milan, MD      ? montelukast Laurine Blazer) tablet 10 mg  10 mg Oral QHS Reubin Milan, MD   10 mg at 09/25/21 2100  ? Muscle Rub CREA   Topical PRN Reubin Milan, MD   1 application. at 09/24/21 0214  ? ondansetron (ZOFRAN) tablet 4 mg  4 mg Oral Q6H PRN Reubin Milan, MD      ? Or  ? ondansetron [facility]) injection 4  mg  4 mg Intravenous Q6H PRN Reubin Milan, MD   4 mg at 09/24/21 0057  ? ? ? ?Objective: ?Vital: ?Vitals:  ? 09/25/21 2133 09/26/21 0500 09/26/21 0550 09/26/21 0958  ?BP: (!) 153/73  (!) 151/74 (!) 158/70  ?Pulse: 88  80   ?Resp: 18  16   ?Temp: 98.1 ?F (36.7 ?C)  97.8 ?F (36.6 ?C)   ?TempSrc: Oral  Oral   ?SpO2: 98%  97%   ?Weight:  110 kg    ?Height:      ? ?I/Os: ?I/O last 3 completed shifts: ?In: 1202.1 [P.O.:1090; I.V.:112.1] ?Out: 3100 [Urine:3100] ? ?Physical Exam:  ?General: Patient is in no apparent distress ?Lungs: Normal respiratory effort, chest expands symmetrically. ?GI: The abdomen is soft and nontender without mass. ?Urine is straw-colored or slightly darker, but appears to be clearing. ?Ext: lower extremities symmetric ? ?Lab Results: ?Recent Labs  ?  09/24/21 ?6767 09/24/21 ?1949 09/25/21 ?2094 09/25/21 ?7096 09/25/21 ?2116 09/26/21 ?0127 09/26/21 ?2836  ?WBC 10.9*  --  9.2  --   --  11.0*  --   ?HGB 7.9*   < > 8.0*   < > 7.6* 8.5* 9.0*  ?HCT 25.1*   < > 26.0*   < > 24.5*  28.0* 28.1*  ? < > = values in this interval not displayed.  ? ?Recent Labs  ?  09/24/21 ?0346 09/25/21 ?7342 09/26/21 ?0127  ?NA 136 135 136  ?K 3.9 4.3 3.9  ?CL 105 104 103  ?CO2 '24 24 24  '$ ?GLUCOSE 174* 185* 183*  ?BUN '11 9 12  '$ ?CREATININE 0.81 0.95 0.91  ?CALCIUM 8.1* 8.0* 8.2*  ? ?No results for input(s): LABPT, INR in the last 72 hours. ?No results for input(s): LABURIN in the last 72 hours. ?Results for orders placed or performed during the hospital encounter of 09/23/21  ?Resp Panel by RT-PCR (Flu A&B, Covid) Nasopharyngeal Swab     Status: None  ? Collection Time: 09/23/21  9:09 AM  ? Specimen: Nasopharyngeal Swab; Nasopharyngeal(NP) swabs in vial transport medium  ?Result Value Ref Range Status  ? SARS Coronavirus 2 by RT PCR NEGATIVE NEGATIVE Final  ?  Comment: (NOTE) ?SARS-CoV-2 target nucleic acids are NOT DETECTED. ? ?The SARS-CoV-2 RNA is generally detectable in upper respiratory ?specimens during the acute  phase of infection. The lowest ?concentration of SARS-CoV-2 viral copies this assay can detect is ?138 copies/mL. A negative result does not preclude SARS-Cov-2 ?infection and should not be used as the sole basis for treatment or ?other patient management decisions. A negative result may occur with  ?improper specimen collection/handling, submission of specimen other ?than nasopharyngeal swab, presence of viral mutation(s) within the ?areas targeted by this assay, and inadequate number of viral ?copies(<138 copies/mL). A negative result must be combined with ?clinical observations, patient history, and epidemiological ?information. The expected result is Negative. ? ?Fact Sheet for Patients:  ?EntrepreneurPulse.com.au ? ?Fact Sheet for Healthcare Providers:  ?IncredibleEmployment.be ? ?This test is no t yet approved or cleared by the [place] FDA and  ?has been authorized for detection and/or diagnosis of SARS-CoV-2 by ?FDA under an Emergency Use Authorization (EUA). This EUA will remain  ?in effect (meaning this test can be used) for the duration of the ?COVID-19 declaration under Section 564(b)(1) of the Act, 21 ?U.S.C.section 360bbb-3(b)(1), unless the authorization is terminated  ?or revoked sooner.  ? ? ?  ? Influenza A by PCR NEGATIVE NEGATIVE Final  ? Influenza B by PCR NEGATIVE NEGATIVE Final  ?  Comment: (NOTE) ?The Xpert Xpress SARS-CoV-2/FLU/RSV plus assay is intended as an aid ?in the diagnosis of influenza from Nasopharyngeal swab specimens and ?should not be used as a sole basis for treatment. Nasal washings and ?aspirates are unacceptable for Xpert Xpress SARS-CoV-2/FLU/RSV ?testing. ? ?Fact Sheet for Patients: ?EntrepreneurPulse.com.au ? ?Fact Sheet for Healthcare Providers: ?IncredibleEmployment.be ? ?This test is not yet approved or cleared by the [place] FDA and ?has been authorized for detection and/or diagnosis of  SARS-CoV-2 by ?FDA under an Emergency Use Authorization (EUA). This EUA will remain ?in effect (meaning this test can be used) for the duration of the ?COVID-19 declaration under Section 564(b)(1) of the Act, 21 U.S.C. ?section 360bbb-3(b)(1), unless the authorization is terminated or ?revoked. ? ?Performed at [facility], [place] [address] ?[place], [place] [address] ?  ?Urine Culture     Status: Abnormal  ? Collection Time: 09/23/21 11:18 AM  ? Specimen: Urine, Clean Catch  ?Result Value Ref Range Status  ? Specimen Description   Final  ?  URINE, CLEAN CATCH ?Performed at [facility], [place] [address], [place], [place] [address] ?  ? Special Requests   Final  ?  NONE ?Performed at [facility],  [place] [address] [place], [place] [address] ?  ? Culture >=100,000 COLONIES/mL ESCHERICHIA COLI (A)  Final  ? Report Status 09/25/2021 FINAL  Final  ? Organism ID, Bacteria ESCHERICHIA COLI (A)  Final  ?    Susceptibility  ? Escherichia coli - MIC*  ?  AMPICILLIN >=32 RESISTANT Resistant   ?  CEFAZOLIN <=4 SENSITIVE Sensitive   ?  CEFEPIME <=0.12 SENSITIVE Sensitive   ?  CEFTRIAXONE <=0.25 SENSITIVE Sensitive   ?  CIPROFLOXACIN >=4 RESISTANT Resistant   ?  GENTAMICIN <=1 SENSITIVE Sensitive   ?  IMIPENEM <=0.25 SENSITIVE Sensitive   ?  NITROFURANTOIN <=16 SENSITIVE Sensitive   ?  TRIMETH/SULFA <=20 SENSITIVE Sensitive   ?  AMPICILLIN/SULBACTAM 16 INTERMEDIATE Intermediate   ?  PIP/TAZO <=4 SENSITIVE Sensitive   ?  * >=100,000 COLONIES/mL ESCHERICHIA COLI  ? ? ?Studies/Results: ?I independently reviewed the patient's CT scan that was done upon admission.  There is a perinephric hematoma around the right kidney, but fortunately no stones remain. ? ?Assessment: ?Patient has right perinephric hematoma and E. coli pyelonephritis.  Hemoglobin stable now and she has been afebrile.  She has been transitioned to Keflex which is liquid.  She seems to be tolerating that  well. ? ?Plan: ?The patient has made progress and appears to be close to discharge.  Scheduled for 10 days of antibiotics.  Hopefully this is an appropriate duration to clear the pyelonephritis and prevent the hemat

## 2021-09-27 LAB — HEMOGLOBIN AND HEMATOCRIT, BLOOD
HCT: 26.3 % — ABNORMAL LOW (ref 36.0–46.0)
Hemoglobin: 8.1 g/dL — ABNORMAL LOW (ref 12.0–15.0)

## 2021-09-27 LAB — CBC WITH DIFFERENTIAL/PLATELET
Abs Immature Granulocytes: 0.16 10*3/uL — ABNORMAL HIGH (ref 0.00–0.07)
Basophils Absolute: 0.1 10*3/uL (ref 0.0–0.1)
Basophils Relative: 1 %
Eosinophils Absolute: 0.3 10*3/uL (ref 0.0–0.5)
Eosinophils Relative: 3 %
HCT: 25.5 % — ABNORMAL LOW (ref 36.0–46.0)
Hemoglobin: 8.2 g/dL — ABNORMAL LOW (ref 12.0–15.0)
Immature Granulocytes: 2 %
Lymphocytes Relative: 16 %
Lymphs Abs: 1.5 10*3/uL (ref 0.7–4.0)
MCH: 27.3 pg (ref 26.0–34.0)
MCHC: 32.2 g/dL (ref 30.0–36.0)
MCV: 85 fL (ref 80.0–100.0)
Monocytes Absolute: 0.7 10*3/uL (ref 0.1–1.0)
Monocytes Relative: 7 %
Neutro Abs: 6.7 10*3/uL (ref 1.7–7.7)
Neutrophils Relative %: 71 %
Platelets: 484 10*3/uL — ABNORMAL HIGH (ref 150–400)
RBC: 3 MIL/uL — ABNORMAL LOW (ref 3.87–5.11)
RDW: 13.2 % (ref 11.5–15.5)
WBC: 9.3 10*3/uL (ref 4.0–10.5)
nRBC: 0 % (ref 0.0–0.2)

## 2021-09-27 LAB — COMPREHENSIVE METABOLIC PANEL
ALT: 9 U/L (ref 0–44)
AST: 12 U/L — ABNORMAL LOW (ref 15–41)
Albumin: 2.6 g/dL — ABNORMAL LOW (ref 3.5–5.0)
Alkaline Phosphatase: 67 U/L (ref 38–126)
Anion gap: 9 (ref 5–15)
BUN: 13 mg/dL (ref 8–23)
CO2: 23 mmol/L (ref 22–32)
Calcium: 8.5 mg/dL — ABNORMAL LOW (ref 8.9–10.3)
Chloride: 103 mmol/L (ref 98–111)
Creatinine, Ser: 0.79 mg/dL (ref 0.44–1.00)
GFR, Estimated: >60 mL/min (ref >60)
Glucose, Bld: 140 mg/dL — ABNORMAL HIGH (ref 70–99)
Potassium: 4 mmol/L (ref 3.5–5.1)
Sodium: 135 mmol/L (ref 135–145)
Total Bilirubin: 0.3 mg/dL (ref 0.3–1.2)
Total Protein: 7 g/dL (ref 6.5–8.1)

## 2021-09-27 LAB — GLUCOSE, CAPILLARY
Glucose-Capillary: 198 mg/dL — ABNORMAL HIGH (ref 70–99)
Glucose-Capillary: 281 mg/dL — ABNORMAL HIGH (ref 70–99)

## 2021-09-27 LAB — PHOSPHORUS: Phosphorus: 3.8 mg/dL (ref 2.5–4.6)

## 2021-09-27 LAB — MAGNESIUM: Magnesium: 1.9 mg/dL (ref 1.7–2.4)

## 2021-09-27 MED ORDER — CEPHALEXIN 250 MG/5ML PO SUSR: 500.0000 mg | Freq: Three times a day (TID) | ORAL | 0 refills | Status: DC

## 2021-09-27 MED ORDER — METFORMIN HCL 500 MG PO TABS: 500.0000 mg | ORAL_TABLET | Freq: Two times a day (BID) | ORAL | 0 refills | Status: DC

## 2021-09-27 MED ORDER — DILTIAZEM HCL ER COATED BEADS 120 MG PO CP24: 120.0000 mg | ORAL_CAPSULE | Freq: Every day | ORAL | 0 refills | Status: DC

## 2021-09-27 MED ORDER — LISINOPRIL 2.5 MG PO TABS: 2.5000 mg | ORAL_TABLET | Freq: Every day | ORAL | 0 refills | Status: DC

## 2021-09-27 MED ORDER — ATORVASTATIN CALCIUM 40 MG PO TABS: 40.0000 mg | ORAL_TABLET | Freq: Every day | ORAL | 0 refills | Status: DC

## 2021-09-27 NOTE — Discharge Summary (Signed)
Physician Discharge Summary  ?[name] QQP:[id number] DOB: 01-Aug-1945 DOA: 09/23/2021 ? ?PCP: [name], DO ? ?Admit date: 09/23/2021 ?Discharge date: 09/27/2021 ? ?Time spent: 35 minutes ? ?Recommendations for Outpatient Follow-up:  ? ?RIGHT Renal Hematoma ?-Secondary Nephrolithiasis/laser surgery ?-3/15 Per husband and patient DID NOT receive lithotripsy but received laser treatment to break up stone with stent placement.  Unable to verify due to [facility] EMR being on viewable. ?-2/22 s/p right ureteroscopic stone manipulation for UPJ stone  ?  ?Urolithiasis  ?- long h/o recurrent stones managed aggressively with Dr. Louis Meckel.  ?  ?Acute UTI complicated positive E Coli ?-3/15 normal saline with 20 mEq of KCl 75 mL/h. ?-Complete 10-day course antibiotics: Currently Ceftriaxone 2 g IVPB daily. ?-Urology consulted . ?-3/16 discussed case with pharmacy we will start patient on Keflex 500 mg TID first dose 3/17 ?-Patient already has appointment with Dr. Louis Meckel [facility] follow-up nephrolithiasis/laser surgery/complicated UTI ?  ?Acute blood loss anemia vs Hemodilution ?-3/14 patient's hemoglobin on admission 9.1 ?-3/15 serial H/H q 4hrs. ?Lab Results  ?Component Value Date  ? HGB 8.1 (L) 09/27/2021  ? HGB 8.2 (L) 09/27/2021  ? HGB 9.0 (L) 09/26/2021  ? HGB 8.5 (L) 09/26/2021  ? HGB 7.6 (L) 09/25/2021  ?-Stable most likely hemodilution ?- Patient to follow-up with PCP in 1 to 2 weeks to monitor H/H, BP, diabetes. ? ?Chronic diastolic CHF ?-Strict in and out +1.6 L ?- Daily weight ?Filed Weights  ? 09/23/21 1417 09/26/21 0500 09/27/21 0319  ?Weight: 108.8 kg 110 kg 111.6 kg  ?- Diltiazem 120 mg daily. ?-3/17 lisinopril 2.5 mg daily ?  ?Essential hypertension ?-See CHF ?  ?  ?Paroxysmal atrial fibrillation (HCC) ?-No longer on anticoagulation. ?-See CHF ?  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  Patient to follow-up with PCP newly diagnosed diabetic. ?  ?HLD ?-  3/16 LDL = 75: Goal LDL <70 ?-3/17 Lipitor 40 mg daily ?  ?  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 ? ? ?Discharge Diagnoses:  ?Principal Problem: ?  Renal hematoma, right ?Active Problems: ?  Ureteral calculus, right ?  Chronic diastolic heart failure ([place]) ?  Obesity ?  Essential hypertension ?  Paroxysmal atrial fibrillation (HCC) ?  Prediabetes ?  Acute blood loss anemia ?  Acute UTI (urinary tract infection) ?  Complicated UTI (urinary tract infection) ?  Diabetes mellitus type 2 in obese [facility]) ?  HLD (hyperlipidemia) ? ? ?Discharge Condition: Stable ? ?Diet recommendation: Carb modified ? ?Filed Weights  ? 09/23/21 1417 09/26/21 0500 09/27/21 0319  ?Weight: 108.8 kg 110 kg 111.6 kg  ? ? ?History of present illness:  ?76 y.o. WF PMHx acute hypoxemic respiratory failure, osteoarthritis of both knees, chronic atrial fibrillation, bilateral carpal tunnel syndrome, chronic diastolic heart failure, lumbosacral DDD, history of lumbar stenosis, history of postlaminectomy syndrome, first-degree AV block, history of nephrolithiasis, history of unspecified rib fracture, obesity, prediabetes, bilateral rotator cuff tears, history of UTI who underwent right uteroscopic stone manipulation for UPJ stone on 09/03/2021  ?  ?Is coming to the emergency department due to right flank pain, generalized weakness, night sweats, chills, decreased appetite, multiple episodes of nausea/emesis and hematuria.  No sore throat, dyspnea, chest pain, palpitations, PND, orthopnea, polyuria, polydipsia, polyphagia or blurred vision. ?  ? ?Hospital Course:  ?See above ? ?Procedures: ?CT renal study shows interval development of large right renal hematoma measuring 10.3 x 9 x  8.6 cm which may be related to interval lithotripsy.  There was a large calculus seen at the right ureteropelvic junction which is not seen on this study.  No evidence of an intestinal obstruction, cystitis or pneumoperitoneum.  There is no  hydronephrosis.  There was diverticulosis of the colon.  Please see images and full radiology report for further details. (i.e. Studies not automatically included, echos, thoracentesis, etc; not x-rays) ? ?Consultations: ?Urology Dr. Alexis Frock ? ? ?Cultures  ?3/14 urine positive E Coli  ? ? ?Antibiotics ?Anti-infectives (From admission, onward)  ? ? Start     Ordered Stop  ? 09/26/21 1000  cefdinir (OMNICEF) 125 MG/5ML suspension 300 mg  Status:  Discontinued       ? 09/25/21 1349 09/25/21 1449  ? 09/26/21 0600  cephALEXin (KEFLEX) 250 MG/5ML suspension 500 mg       ? 09/25/21 1449 10/03/21 0559  ? 09/24/21 1230  cefTRIAXone (ROCEPHIN) 2 g in sodium chloride 0.9 % 100 mL IVPB  Status:  Discontinued       ? 09/23/21 1316 09/25/21 1349  ? 09/23/21 1230  cefTRIAXone (ROCEPHIN) 2 g in sodium chloride 0.9 % 100 mL IVPB       ? 09/23/21 1224 09/23/21 1300  ? ?  ?  ? ? ?Discharge Exam: ?Vitals:  ? 09/26/21 2376 09/26/21 2117 09/27/21 0316 09/27/21 0319  ?BP: (!) 158/70 (!) 156/77 (!) 150/70   ?Pulse:  79 75   ?Resp:  20 (!) 22   ?Temp:  98.2 ?F (36.8 ?C) 98.5 ?F (36.9 ?C)   ?TempSrc:  Oral Oral   ?SpO2:  99% 99%   ?Weight:    111.6 kg  ?Height:      ? ? ?General: A/O x4, No acute respiratory distress ?Eyes: negative scleral hemorrhage, negative anisocoria, negative icterus ?ENT: Negative Runny nose, negative gingival bleeding, ?Neck:  Negative scars, masses, torticollis, lymphadenopathy, JVD ?Lungs: Clear to auscultation bilaterally without wheezes or crackles ? ?Discharge Instructions ? ? ?Allergies as of 09/27/2021   ? ?   Reactions  ? Contrast Media [iodinated Contrast Media] Hives, Rash  ? Omnipaque. ?Rash, itching  ? Cipro [ciprofloxacin Hcl] Nausea And Vomiting  ? Pt reports she thinks she is allergic and reports its caused her nausea, anxious, and weakness. Pt unsure.  ? Doxycycline Nausea And Vomiting, Other (See Comments)  ? Gabapentin Other (See Comments)  ? Too drowsy  ? Metformin Hcl Diarrhea, Other (See  Comments)  ? Pregabalin Other (See Comments)  ? Too drowsy  ? Semaglutide(0.25 Or 0.'5mg'$ -dos) Other (See Comments)  ? Stomach upset  ? Ancef [cefazolin] Rash  ? Rash to ancef vs omniopaue contrast  ? Augmentin [amoxicillin-pot Clavulanate] Itching, Nausea And Vomiting, Rash  ? Did it involve swelling of the face/tongue/throat, SOB, or low BP? Unknown ?Did it involve sudden or severe rash/hives, skin peeling, or any reaction on the inside of your mouth or nose? Yes ?Did you need to seek medical attention at a hospital or doctor's office? No ?When did it last happen? 2020      ?If all above answers are ?NO?, may proceed with cephalosporin use.  ? Sulfa Antibiotics Itching, Nausea And Vomiting, Rash, Other (See Comments)  ? Tape Swelling  ? Adhesive Tape  ? ?  ? ?  ?Medication List  ?  ? ?TAKE these medications   ? ?acetaminophen 650 MG CR tablet ?Commonly known as: TYLENOL ?Take 1,300 mg by mouth every 8 (eight) hours as needed for pain. ?  ?  aspirin EC 81 MG tablet ?Take 81 mg by mouth daily. Swallow whole. ?  ?atorvastatin 40 MG tablet ?Commonly known as: LIPITOR ?Take 1 tablet (40 mg total) by mouth daily. ?Start taking on: September 28, 2021 ?  ?cephALEXin 250 MG/5ML suspension ?Commonly known as: KEFLEX ?Take 10 mLs (500 mg total) by mouth every 8 (eight) hours. ?  ?diclofenac sodium 1 % Gel ?Commonly known as: VOLTAREN ?Apply 2 g topically 3 (three) times daily as needed (back pain). ?  ?diltiazem 120 MG 24 hr capsule ?Commonly known as: CARDIZEM CD ?Take 1 capsule (120 mg total) by mouth daily. ?Start taking on: September 28, 2021 ?What changed: See the new instructions. ?  ?lisinopril 2.5 MG tablet ?Commonly known as: ZESTRIL ?Take 1 tablet (2.5 mg total) by mouth daily. ?Start taking on: September 28, 2021 ?  ?metFORMIN 500 MG tablet ?Commonly known as: GLUCOPHAGE ?Take 1 tablet (500 mg total) by mouth 2 (two) times daily with a meal. ?  ?montelukast 10 MG tablet ?Commonly known as: SINGULAIR ?Take 1 tablet (10 mg total)  by mouth at bedtime. ?  ?VITAMIN B-12 PO ?Take 1 tablet by mouth daily. ?  ? ?  ? ?Allergies  ?Allergen Reactions  ? Contrast Media [Iodinated Contrast Media] Hives and Rash  ?  Omnipaque. ? ?Rash, itching  ? Cipro [Cip

## 2021-10-15 DIAGNOSIS — R1084 Generalized abdominal pain: Secondary | ICD-10-CM | POA: Diagnosis not present

## 2021-10-15 DIAGNOSIS — N3000 Acute cystitis without hematuria: Secondary | ICD-10-CM | POA: Diagnosis not present

## 2021-10-15 DIAGNOSIS — R8271 Bacteriuria: Secondary | ICD-10-CM | POA: Diagnosis not present

## 2021-10-15 DIAGNOSIS — N201 Calculus of ureter: Secondary | ICD-10-CM | POA: Diagnosis not present

## 2021-10-15 DIAGNOSIS — S37011D Minor contusion of right kidney, subsequent encounter: Secondary | ICD-10-CM | POA: Diagnosis not present

## 2021-10-24 DIAGNOSIS — I480 Paroxysmal atrial fibrillation: Secondary | ICD-10-CM | POA: Diagnosis not present

## 2021-10-24 DIAGNOSIS — R7989 Other specified abnormal findings of blood chemistry: Secondary | ICD-10-CM | POA: Diagnosis not present

## 2021-10-24 DIAGNOSIS — E1159 Type 2 diabetes mellitus with other circulatory complications: Secondary | ICD-10-CM | POA: Diagnosis not present

## 2021-10-24 DIAGNOSIS — E785 Hyperlipidemia, unspecified: Secondary | ICD-10-CM | POA: Diagnosis not present

## 2021-10-24 DIAGNOSIS — R300 Dysuria: Secondary | ICD-10-CM | POA: Diagnosis not present

## 2021-10-31 DIAGNOSIS — E1159 Type 2 diabetes mellitus with other circulatory complications: Secondary | ICD-10-CM | POA: Diagnosis not present

## 2021-10-31 DIAGNOSIS — I480 Paroxysmal atrial fibrillation: Secondary | ICD-10-CM | POA: Diagnosis not present

## 2021-10-31 DIAGNOSIS — Z09 Encounter for follow-up examination after completed treatment for conditions other than malignant neoplasm: Secondary | ICD-10-CM | POA: Diagnosis not present

## 2021-10-31 DIAGNOSIS — Z0000 Encounter for general adult medical examination without abnormal findings: Secondary | ICD-10-CM | POA: Diagnosis not present

## 2021-10-31 DIAGNOSIS — J3089 Other allergic rhinitis: Secondary | ICD-10-CM | POA: Diagnosis not present

## 2021-10-31 DIAGNOSIS — M5136 Other intervertebral disc degeneration, lumbar region: Secondary | ICD-10-CM | POA: Diagnosis not present

## 2021-10-31 DIAGNOSIS — Q615 Medullary cystic kidney: Secondary | ICD-10-CM | POA: Diagnosis not present

## 2021-10-31 DIAGNOSIS — N200 Calculus of kidney: Secondary | ICD-10-CM | POA: Diagnosis not present

## 2021-11-11 ENCOUNTER — Ambulatory Visit: Payer: PPO | Admitting: Family Medicine

## 2021-12-22 DIAGNOSIS — M545 Low back pain, unspecified: Secondary | ICD-10-CM | POA: Diagnosis not present

## 2021-12-22 DIAGNOSIS — R1084 Generalized abdominal pain: Secondary | ICD-10-CM | POA: Diagnosis not present

## 2021-12-22 DIAGNOSIS — N3021 Other chronic cystitis with hematuria: Secondary | ICD-10-CM | POA: Diagnosis not present

## 2022-01-14 ENCOUNTER — Ambulatory Visit: Payer: PPO

## 2022-02-09 DIAGNOSIS — N3021 Other chronic cystitis with hematuria: Secondary | ICD-10-CM | POA: Diagnosis not present

## 2022-02-09 DIAGNOSIS — N200 Calculus of kidney: Secondary | ICD-10-CM | POA: Diagnosis not present

## 2022-02-10 ENCOUNTER — Ambulatory Visit (INDEPENDENT_AMBULATORY_CARE_PROVIDER_SITE_OTHER): Payer: PPO

## 2022-02-10 DIAGNOSIS — Z0000 Encounter for general adult medical examination without abnormal findings: Secondary | ICD-10-CM

## 2022-02-10 NOTE — Progress Notes (Signed)
   Subjective:  Patient declines states Dr.Kremer not her PCP   L.Wilson,LPN

## 2022-03-17 ENCOUNTER — Other Ambulatory Visit: Payer: Self-pay | Admitting: Interventional Cardiology

## 2022-03-19 ENCOUNTER — Other Ambulatory Visit: Payer: Self-pay

## 2022-03-19 ENCOUNTER — Telehealth: Payer: Self-pay

## 2022-03-19 DIAGNOSIS — Q615 Medullary cystic kidney: Secondary | ICD-10-CM | POA: Insufficient documentation

## 2022-03-19 DIAGNOSIS — M5136 Other intervertebral disc degeneration, lumbar region: Secondary | ICD-10-CM | POA: Insufficient documentation

## 2022-03-19 DIAGNOSIS — J3089 Other allergic rhinitis: Secondary | ICD-10-CM | POA: Insufficient documentation

## 2022-03-19 NOTE — Telephone Encounter (Signed)
The patient called to see why her diltiazem refill was denied.  The patient was notified that her refill approval  was sent this morning for quantity of 90 this morning to [facility] on [address] in [place].

## 2022-03-27 ENCOUNTER — Telehealth: Payer: Self-pay | Admitting: Family Medicine

## 2022-03-27 NOTE — Telephone Encounter (Signed)
There is an appointment from 11/11/2021 that shows as completed

## 2022-03-27 NOTE — Telephone Encounter (Signed)
Do not see in patients chart where she's seen Dr. Ethelene Hal either.

## 2022-03-27 NOTE — Telephone Encounter (Signed)
Caller Name: [name] Call back phone #: [phone]  Reason for Call: Pt walked in to make new patient appointment with Vance Peper. She completed a NP appt in May of this year with Dr. Ethelene Hal and after telling her of this she insisted that she never did. Would Dr. Ethelene Hal be okay with transferring care?

## 2022-03-29 NOTE — Progress Notes (Unsigned)
Cardiology Office Note:    Date:  03/30/2022   ID:  [name], DOB 1945-12-27, MRN [id number]  PCP:  Libby Maw, MD  Cardiologist:  [name], MD   Referring MD: Libby Maw,*   Chief Complaint  Patient presents with   Congestive Heart Failure   Atrial Fibrillation    History of Present Illness:    [name] is a 76 y.o. female with a hx of  paroxysmal atrial fibrillation, first-degree AV block, diastolic heart failure, and hyperlipidemia.  She is having some difficulty with shortness of breath on exertion.  She has not had any significant prolonged episodes of atrial fibrillation.  She has done well on diltiazem CD1 120 mg/day.  CV continuous 14-day monitor done a year ago.  She has a history of recurrent urinary tract infections.  She has never been tried on SGLT2 because of this.  Past Medical History:  Diagnosis Date   Acute hypoxemic respiratory failure ([place]) 10/24/2016   Arthritis    both knees   Atrial fibrillation, chronic (HCC)    Carpal tunnel syndrome, bilateral    both hands go to sleep at times from elbows down   Chronic diastolic heart failure (HCC)    DDD (degenerative disc disease), lumbosacral    Dyspnea    during chronic bronchitis, resolved at this time   First degree AV block    Hematuria 11/15/2017   History of kidney stones    History of rib fracture 03/2011   Lumbar stenosis    Obesity    Postlaminectomy syndrome    Pre-diabetes    Renal calculi    BILATERAL   Right ureteral stone    Rotator cuff tear, left    Rotator cuff tear, right    UTI (urinary tract infection)     Past Surgical History:  Procedure Laterality Date   ANTERIOR CERVICAL DECOMP/DISCECTOMY FUSION  03-18-2011   C3 -- C7   ANTERIOR LATERAL LUMBAR FUSION 4 LEVELS N/A 05/06/2018   Procedure: Lumbar one- two Lumbar two -three Lumbar  three-four Lumbar four-five Anterolateral decompression/fusion/posterior bilateral laminectomy  Lumbar four-five Lumbar five Sacral one/posterior percutaneous fixation/mazor;  Surgeon: Kristeen Miss, MD;  Location: [facility];  Service: Neurosurgery;  Laterality: N/A;   APPENDECTOMY     APPLICATION OF ROBOTIC ASSISTANCE FOR SPINAL PROCEDURE N/A 05/06/2018   Procedure: APPLICATION OF ROBOTIC ASSISTANCE FOR SPINAL PROCEDURE;  Surgeon: Kristeen Miss, MD;  Location: [place];  Service: Neurosurgery;  Laterality: N/A;   BILATERAL CARPAL TUNNEL RELEASE     3 screws in right hand, thumb, middle finger, and index finger,    CARDIAC CATHETERIZATION N/A 01/16/2015   Procedure: Left Heart Cath and Coronary Angiography;  Surgeon: Belva Crome, MD;  Location: [place] [facility];  Service: Cardiovascular;  Laterality: N/A;   CHOLECYSTECTOMY OPEN  1970'S   COLONOSCOPY  2018   CYSTOSCOPY WITH RETROGRADE PYELOGRAM, URETEROSCOPY AND STENT PLACEMENT N/A 02/13/2013   Procedure: CYSTOSCOPY WITH RETROGRADE PYELOGRAM, URETEROSCOPY AND LITHOTRIPSY ;  Surgeon: Claybon Jabs, MD;  Location: [facility];  Service: Urology;  Laterality: N/A;   CYSTOSCOPY WITH URETEROSCOPY AND STENT PLACEMENT Left 04/17/2019   Procedure: CYSTOSCOPY WITH LEFT  RETROGRADE PYELOGRAM, LEFT URETEROSCOPY, HOLMIUM LASER AND LEFT STENT PLACEMENT;  Surgeon: Kathie Rhodes, MD;  [facility];  Service: Urology;  Laterality: Left;   CYSTOSCOPY/URETEROSCOPY/HOLMIUM LASER/STENT PLACEMENT Right 11/22/2017   Procedure: CYSTOSCOPY/URETEROSCOPY/RETROGRADE PYELOGRAM/HOLMIUM LASER/STENT PLACEMENT;  Surgeon: Kathie Rhodes, MD;  Location: [place];  Service: Urology;  Laterality: Right;   EXTRACORPOREAL SHOCK WAVE LITHOTRIPSY Right 05-16-2012   FINGER ARTHROSCOPY WITH CARPOMETACARPEL [facility]) ARTHROPLASTY     HOLMIUM LASER APPLICATION Right 11/18/5636   Procedure: HOLMIUM LASER APPLICATION;  Surgeon: Claybon Jabs, MD;  Location: [facility];  Service: Urology;  Laterality: Right;   IR URETERAL STENT LEFT NEW ACCESS W/O  SEP NEPHROSTOMY CATH  02/13/2019   KNEE ARTHROSCOPY Bilateral    LUMBAR LAMINECTOMY  X2  1960'S   LUMBAR PERCUTANEOUS PEDICLE SCREW 4 LEVEL N/A 05/06/2018   Procedure: LUMBAR PERCUTANEOUS PEDICLE SCREW LUMBAR ONE TO LUMBAR FIVE WITH MAZOR ;POSTERIOR LATERAL LAMINECTOMY LUMBAR FOUR-FIVE,FIVE-SACRAL ONE;  Surgeon: Kristeen Miss, MD;  Location: [place];  Service: Neurosurgery;  Laterality: N/A;   NASAL SEPTUM SURGERY  1970'S   NEPHROLITHOTOMY Left 02/20/2019   Procedure: NEPHROLITHOTOMY PERCUTANEOUS;  Surgeon: Kathie Rhodes, MD;  [facility];  Service: Urology;  Laterality: Left;   SHOULDER OPEN ROTATOR CUFF REPAIR Bilateral LEFT 01-11-2001/   RIGHT 07-21-2002   TOTAL KNEE ARTHROPLASTY  04/13/2012   Procedure: TOTAL KNEE ARTHROPLASTY;  Surgeon: Gearlean Alf, MD;  [facility];  Service: Orthopedics;  Laterality: Left;   TOTAL KNEE ARTHROPLASTY  08/08/2012   Procedure: TOTAL KNEE ARTHROPLASTY;  Surgeon: Gearlean Alf, MD;  [facility];  Service: Orthopedics;  Laterality: Right;   TRANSTHORACIC ECHOCARDIOGRAM  03-29-2011   MODERATE LVH/ EF 60-65%/ MILD MR   URETEROSCOPY WITH HOLMIUM LASER LITHOTRIPSY Left 06/19/2019   Procedure: URETEROSCOPY WITH HOLMIUM LASER LITHOTRIPSY/INSERTION OF DOUBLE J STENT;  Surgeon: Kathie Rhodes, MD;  [facility];  Service: Urology;  Laterality: Left;   VAGINAL HYSTERECTOMY  1960'S    Current Medications: Current Meds  Medication Sig   aspirin EC 81 MG tablet Take 81 mg by mouth daily. Swallow whole.   Cyanocobalamin (VITAMIN B-12 PO) Take 1 tablet by mouth daily.   diclofenac sodium (VOLTAREN) 1 % GEL Apply 2 g topically 3 (three) times daily as needed (back pain).    diltiazem (CARDIZEM CD) 120 MG 24 hr capsule TAKE 1 CAPSULE(120 MG) BY MOUTH DAILY   montelukast (SINGULAIR) 10 MG tablet Take 1 tablet (10 mg total) by mouth at bedtime.     Allergies:   Contrast media [iodinated contrast media], Cipro [ciprofloxacin hcl], Doxycycline, Gabapentin,  Metformin hcl, Pregabalin, Semaglutide(0.25 or 0.'5mg'$ -dos), Ancef [cefazolin], Augmentin [amoxicillin-pot clavulanate], Sulfa antibiotics, and Tape   Social History   Socioeconomic History   Marital status: Married    Spouse name: Not on file   Number of children: Not on file   Years of education: Not on file   Highest education level: Not on file  Occupational History   Not on file  Tobacco Use   Smoking status: Never   Smokeless tobacco: Never  Vaping Use   Vaping Use: Never used  Substance and Sexual Activity   Alcohol use: No   Drug use: No   Sexual activity: Yes    Partners: Male    Birth control/protection: Surgical, None  Other Topics Concern   Not on file  Social History Narrative   Not on file   Social Determinants of Health   Financial Resource Strain: Not on file  Food Insecurity: Not on file  Transportation Needs: Not on file  Physical Activity: Not on file  Stress: Not on file  Social Connections: Not on file     Family History: The patient's family history includes Diabetes in her brother,  brother, and sister; Diabetes type II in her mother; Heart disease in her brother and brother; Hypertension in her brother, brother, and sister; Lung cancer in her father.  ROS:   Please see the history of present illness.    Sedentary.  Limited by dyspnea.  Recurrent UTI all other systems reviewed and are negative.  EKGs/Labs/Other Studies Reviewed:    The following studies were reviewed today: 2D Doppler echocardiogram done 6073: Normal systolic function with EF greater than 55%. Grade 1 diastolic dysfunction.  CONTINUOUS 14 Day Monitor 03/14/2021 Study Highlights    Normal sinus rhythm Frequent salvos of SVT with fastest 14 beats at 171 bpm SVT longest at [place] 114 bpm lasting 1 minute 45 seconds. R-R variability prevents exclusion of AF No symptoms reported.   High burden of SVT salvos (brief), self terminating and unable to exclude AF on a few. No  sustained AF noted. EKG:  EKG sinus rhythm with first-degree AV block.  This is EKG performed October 25, 2021.  Recent Labs: 09/27/2021: ALT 9; BUN 13; Creatinine, Ser 0.79; Hemoglobin 8.1; Magnesium 1.9; Platelets 484; Potassium 4.0; Sodium 135  Recent Lipid Panel    Component Value Date/Time   CHOL 142 09/25/2021 0549   TRIG 70 09/25/2021 0549   HDL 53 09/25/2021 0549   CHOLHDL 2.7 09/25/2021 0549   VLDL 14 09/25/2021 0549   LDLCALC 75 09/25/2021 0549    Physical Exam:    VS:  BP 122/70   Pulse 83   Ht '5\' 2"'$  (1.575 m)   Wt 253 lb 3.2 oz (114.9 kg)   SpO2 97%   BMI 46.31 kg/m     Wt Readings from Last 3 Encounters:  03/30/22 253 lb 3.2 oz (114.9 kg)  09/27/21 246 lb 0.5 oz (111.6 kg)  03/14/21 260 lb 8 oz (118.2 kg)     GEN: Obese. No acute distress HEENT: Normal NECK: No JVD. LYMPHATICS: No lymphadenopathy CARDIAC: No murmur. RRR no gallop, or edema. VASCULAR:  Normal Pulses. No bruits. RESPIRATORY:  Clear to auscultation without rales, wheezing or rhonchi  ABDOMEN: Soft, non-tender, non-distended, No pulsatile mass, MUSCULOSKELETAL: No deformity  SKIN: Warm and dry NEUROLOGIC:  Alert and oriented x 3 PSYCHIATRIC:  Normal affect   ASSESSMENT:    1. Paroxysmal atrial fibrillation (HCC)   2. Chronic diastolic heart failure ([place])   3. Essential hypertension   4. Chronic anticoagulation   5. Bilateral lower extremity edema    PLAN:    In order of problems listed above:  By auscultation, she is in normal sinus rhythm. Diastolic heart failure without evidence of volume overload.  Grade 1 diastolic dysfunction 6 years ago on echo.  The study has not been repeated.  We discussed SGLT2 therapy but she has chronic recurrent urinary tract infections and therefore this would not be a good medication for her.  She is actually being treated for UTI, currently. Blood pressures controlled continue diltiazem. Continue aspirin therapy.  If recurrent A-fib will need lifelong  Eliquis therapy. Resolved.   Medication Adjustments/Labs and Tests Ordered: Current medicines are reviewed at length with the patient today.  Concerns regarding medicines are outlined above.  No orders of the defined types were placed in this encounter.  No orders of the defined types were placed in this encounter.   Patient Instructions  Medication Instructions:  Your physician recommends that you continue on your current medications as directed. Please refer to the Current Medication list given to you today.  *If you  need a refill on your cardiac medications before your next appointment, please call your pharmacy*  Lab Work: NONE  Testing/Procedures: NONE  Follow-Up: At [facility], you and your health needs are our priority.  As part of our continuing mission to provide you with exceptional heart care, we have created designated Provider Care Teams.  These Care Teams include your primary Cardiologist (physician) and Advanced Practice Providers (APPs -  Physician Assistants and Nurse Practitioners) who all work together to provide you with the care you need, when you need it.  Your next appointment:   9-12 months  The format for your next appointment:   In Person  Provider:   [name], MD   Important Information About Sugar         Signed, [name], MD  03/30/2022 3:16 PM    [place]

## 2022-03-30 ENCOUNTER — Ambulatory Visit: Payer: PPO | Attending: Interventional Cardiology | Admitting: Interventional Cardiology

## 2022-03-30 ENCOUNTER — Encounter: Payer: Self-pay | Admitting: Interventional Cardiology

## 2022-03-30 VITALS — BP 122/70 | HR 83 | Ht 62.0 in | Wt 253.2 lb

## 2022-03-30 DIAGNOSIS — Z7901 Long term (current) use of anticoagulants: Secondary | ICD-10-CM | POA: Diagnosis not present

## 2022-03-30 DIAGNOSIS — I5032 Chronic diastolic (congestive) heart failure: Secondary | ICD-10-CM | POA: Diagnosis not present

## 2022-03-30 DIAGNOSIS — I480 Paroxysmal atrial fibrillation: Secondary | ICD-10-CM

## 2022-03-30 DIAGNOSIS — R600 Localized edema: Secondary | ICD-10-CM | POA: Diagnosis not present

## 2022-03-30 DIAGNOSIS — S37011A Minor contusion of right kidney, initial encounter: Secondary | ICD-10-CM | POA: Diagnosis not present

## 2022-03-30 DIAGNOSIS — S301XXA Contusion of abdominal wall, initial encounter: Secondary | ICD-10-CM | POA: Diagnosis not present

## 2022-03-30 DIAGNOSIS — N3021 Other chronic cystitis with hematuria: Secondary | ICD-10-CM | POA: Diagnosis not present

## 2022-03-30 DIAGNOSIS — I10 Essential (primary) hypertension: Secondary | ICD-10-CM | POA: Diagnosis not present

## 2022-03-30 DIAGNOSIS — R1084 Generalized abdominal pain: Secondary | ICD-10-CM | POA: Diagnosis not present

## 2022-03-30 DIAGNOSIS — K5730 Diverticulosis of large intestine without perforation or abscess without bleeding: Secondary | ICD-10-CM | POA: Diagnosis not present

## 2022-03-30 DIAGNOSIS — N200 Calculus of kidney: Secondary | ICD-10-CM | POA: Diagnosis not present

## 2022-03-30 NOTE — Patient Instructions (Signed)
Medication Instructions:  Your physician recommends that you continue on your current medications as directed. Please refer to the Current Medication list given to you today.  *If you need a refill on your cardiac medications before your next appointment, please call your pharmacy*  Lab Work: NONE  Testing/Procedures: NONE  Follow-Up: At [facility], you and your health needs are our priority.  As part of our continuing mission to provide you with exceptional heart care, we have created designated Provider Care Teams.  These Care Teams include your primary Cardiologist (physician) and Advanced Practice Providers (APPs -  Physician Assistants and Nurse Practitioners) who all work together to provide you with the care you need, when you need it.  Your next appointment:   9-12 months  The format for your next appointment:   In Person  Provider:   [name], MD   Important Information About Sugar

## 2022-03-31 DIAGNOSIS — N3021 Other chronic cystitis with hematuria: Secondary | ICD-10-CM | POA: Diagnosis not present

## 2022-04-03 ENCOUNTER — Telehealth: Payer: Self-pay | Admitting: Interventional Cardiology

## 2022-04-03 NOTE — Telephone Encounter (Signed)
Discussed with DOD, Dr. Quentin Ore. Pt advised ok to take the antibiotic. Advised to call office if she experiences any issues with fast heart rates after starting the medication. Patient verbalized understanding and agreeable to plan.

## 2022-04-03 NOTE — Telephone Encounter (Signed)
Pt c/o medication issue:  1. Name of Medication:  Trimethoprim 100 MG  2. How are you currently taking this medication (dosage and times per day)?   3. Are you having a reaction (difficulty breathing--STAT)?   4. What is your medication issue?  Paitent states her Urologist prescribed Trimethoprim 100 MG, once daily. She read that it can speed up HR and would like to know if Dr. Tamala Julian is agreeable. Please advise.

## 2022-04-08 ENCOUNTER — Other Ambulatory Visit: Payer: Self-pay | Admitting: Urology

## 2022-04-08 DIAGNOSIS — S37011D Minor contusion of right kidney, subsequent encounter: Secondary | ICD-10-CM

## 2022-04-10 DIAGNOSIS — S61206A Unspecified open wound of right little finger without damage to nail, initial encounter: Secondary | ICD-10-CM | POA: Diagnosis not present

## 2022-04-10 DIAGNOSIS — Z23 Encounter for immunization: Secondary | ICD-10-CM | POA: Diagnosis not present

## 2022-04-10 DIAGNOSIS — L03032 Cellulitis of left toe: Secondary | ICD-10-CM | POA: Diagnosis not present

## 2022-04-11 DIAGNOSIS — S61206D Unspecified open wound of right little finger without damage to nail, subsequent encounter: Secondary | ICD-10-CM | POA: Diagnosis not present

## 2022-04-14 DIAGNOSIS — S37011D Minor contusion of right kidney, subsequent encounter: Secondary | ICD-10-CM | POA: Diagnosis not present

## 2022-04-17 DIAGNOSIS — Z87448 Personal history of other diseases of urinary system: Secondary | ICD-10-CM | POA: Diagnosis not present

## 2022-04-17 DIAGNOSIS — R188 Other ascites: Secondary | ICD-10-CM | POA: Diagnosis not present

## 2022-04-17 DIAGNOSIS — N200 Calculus of kidney: Secondary | ICD-10-CM | POA: Diagnosis not present

## 2022-04-17 DIAGNOSIS — K469 Unspecified abdominal hernia without obstruction or gangrene: Secondary | ICD-10-CM | POA: Diagnosis not present

## 2022-04-17 DIAGNOSIS — S37011D Minor contusion of right kidney, subsequent encounter: Secondary | ICD-10-CM | POA: Diagnosis not present

## 2022-04-30 ENCOUNTER — Telehealth: Payer: Self-pay | Admitting: Interventional Cardiology

## 2022-04-30 NOTE — Telephone Encounter (Signed)
Gave advisement from Dr. Tamala Julian. Patient verbalized understanding. States she happens to have her yearly follow up with her PCP tomorrow. 05/01/22

## 2022-04-30 NOTE — Telephone Encounter (Signed)
The patient has been having indigestion, she is feeling on the left side around her shoulder blade and up into her neck. Stating "its not a sharp pain just feels like a deep indigestion that would go away if I could burp." She states she has used antiacids and muscular rub, "nothing has helped and it feels kind of tight like I have a crick in my neck." OTC pain medication has not helped. When she lifts her left arm and turns her neck it makes the pain worse and she can feel it pulling under her breast when she does that. Current VS 154/85 91. When asked about swelling she states she has, "no more than usual".   10/16 patient weighed 253 lbs, today 257 lbs, her legs feel tight but there is no indention. She is getting short of breath when she walks from the house to the car, which is normal for her but seems to be a little worse. She feels better when she sits up than when she lays down. She had to get up in the middle of the night a few times recently and sit in the recliner for a few hours then went back to bed.  Gave the patient ER precautions.  Verbalized understanding and agreement.   Will forward to MD for advisement.

## 2022-04-30 NOTE — Telephone Encounter (Signed)
Belva Crome, MD     04/30/22  1:41 PM She needs to be seen by someone. If continuous since 04/27/2022, probably PCP. If coming and going and/or getting worse, go to ER.

## 2022-04-30 NOTE — Telephone Encounter (Signed)
  Pt would like to speak with Dr. Thompson Caul nurse. She said, she was having indigestion since Friday and been taking antiacids. She would like to discuss that with the nurse

## 2022-05-01 ENCOUNTER — Encounter: Payer: Self-pay | Admitting: Nurse Practitioner

## 2022-05-01 ENCOUNTER — Ambulatory Visit (INDEPENDENT_AMBULATORY_CARE_PROVIDER_SITE_OTHER): Payer: PPO | Admitting: Nurse Practitioner

## 2022-05-01 ENCOUNTER — Other Ambulatory Visit: Payer: Self-pay | Admitting: Nurse Practitioner

## 2022-05-01 VITALS — BP 140/90 | HR 77 | Temp 96.6°F | Ht 62.0 in | Wt 260.8 lb

## 2022-05-01 DIAGNOSIS — I480 Paroxysmal atrial fibrillation: Secondary | ICD-10-CM | POA: Diagnosis not present

## 2022-05-01 DIAGNOSIS — D649 Anemia, unspecified: Secondary | ICD-10-CM

## 2022-05-01 DIAGNOSIS — E7800 Pure hypercholesterolemia, unspecified: Secondary | ICD-10-CM | POA: Diagnosis not present

## 2022-05-01 DIAGNOSIS — M810 Age-related osteoporosis without current pathological fracture: Secondary | ICD-10-CM

## 2022-05-01 DIAGNOSIS — E1169 Type 2 diabetes mellitus with other specified complication: Secondary | ICD-10-CM

## 2022-05-01 DIAGNOSIS — E669 Obesity, unspecified: Secondary | ICD-10-CM | POA: Diagnosis not present

## 2022-05-01 DIAGNOSIS — I10 Essential (primary) hypertension: Secondary | ICD-10-CM | POA: Diagnosis not present

## 2022-05-01 DIAGNOSIS — R0789 Other chest pain: Secondary | ICD-10-CM

## 2022-05-01 DIAGNOSIS — I5032 Chronic diastolic (congestive) heart failure: Secondary | ICD-10-CM

## 2022-05-01 DIAGNOSIS — S37011D Minor contusion of right kidney, subsequent encounter: Secondary | ICD-10-CM | POA: Diagnosis not present

## 2022-05-01 DIAGNOSIS — Z23 Encounter for immunization: Secondary | ICD-10-CM

## 2022-05-01 MED ORDER — DILTIAZEM HCL ER COATED BEADS 120 MG PO CP24: ORAL_CAPSULE | ORAL | 0 refills | Status: DC

## 2022-05-01 MED ORDER — FUROSEMIDE 20 MG PO TABS: 20.0000 mg | ORAL_TABLET | Freq: Every day | ORAL | 1 refills | Status: DC | PRN

## 2022-05-01 MED ORDER — MONTELUKAST SODIUM 10 MG PO TABS: 10.0000 mg | ORAL_TABLET | Freq: Every day | ORAL | 1 refills | Status: DC

## 2022-05-01 MED ORDER — PANTOPRAZOLE SODIUM 40 MG PO TBEC: 40.0000 mg | DELAYED_RELEASE_TABLET | Freq: Every day | ORAL | 0 refills | Status: DC

## 2022-05-01 NOTE — Patient Instructions (Signed)
It was great to see you!  Start glucerrna 1 can daily or try to increase your protein with greek yogurt, eggs, chicken, fish   Start protonix 1 tablet daily and the fluid pill (lasix) 1 tablet daily for 3 days then as needed for fluid, weight gain 2 pounds overnight or 5 pounds in a week.   We are checking your labs today and will let you know the results via mychart/phone.   Let's follow-up in 3 months, sooner if you have concerns.  If a referral was placed today, you will be contacted for an appointment. Please note that routine referrals can sometimes take up to 3-4 weeks to process. Please call our office if you haven't heard anything after this time frame.  Take care,  Vance Peper, NP

## 2022-05-01 NOTE — Progress Notes (Unsigned)
New Patient Visit  BP (!) 150/80 (BP Location: Left Arm, Patient Position: Sitting, Cuff Size: Large)   Pulse 77   Temp (!) 96.6 F (35.9 C) (Temporal)   Ht '5\' 2"'$  (1.575 m)   Wt 260 lb 12.8 oz (118.3 kg)   SpO2 96%   BMI 47.70 kg/m    Subjective:    Patient ID: [name], female    DOB: 01-06-1946, 76 y.o.   MRN: [id number]  CC: Chief Complaint  Patient presents with   Establish Care    Np. Est care. Pt c/o dull sharp pain in LT side of chest, pt states she feels like its a deep indigestion pain that hurts when she breaths out deeply x1 day. Pt has a hx of SOB when walking short distances. Cardiologist recommended EKG w/ pcp.     HPI: [name] is a 76 y.o. female presents for new patient visit to establish care.  Introduced to Designer, jewellery role and practice setting.  All questions answered.  Discussed provider/patient relationship and expectations.  Alkaseltzer, antacid. Chest pain started Sunday, left side to her neck and left shoulder. SHe ate chili on Saturday. It has been constant. SHe feels like if she could just burp, she would feel better. She has gained 4 pounds in the last 3 days. She has also noticed orthopnea for the past few days as well. She rates the pain a 5/10 and describes it as a pressure.   Past Medical History:  Diagnosis Date   Acute hypoxemic respiratory failure ([place]) 10/24/2016   Arthritis    both knees   Atrial fibrillation, chronic (HCC)    Carpal tunnel syndrome, bilateral    both hands go to sleep at times from elbows down   Chronic diastolic heart failure (HCC)    DDD (degenerative disc disease), lumbosacral    Dyspnea    during chronic bronchitis, resolved at this time   First degree AV block    Hematuria 11/15/2017   History of kidney stones    History of rib fracture 03/2011   Lumbar stenosis    Obesity    Postlaminectomy syndrome    Pre-diabetes    Renal calculi    BILATERAL   Right ureteral stone    Rotator cuff  tear, left    Rotator cuff tear, right    UTI (urinary tract infection)     Past Surgical History:  Procedure Laterality Date   ANTERIOR CERVICAL DECOMP/DISCECTOMY FUSION  03-18-2011   C3 -- C7   ANTERIOR LATERAL LUMBAR FUSION 4 LEVELS N/A 05/06/2018   Procedure: Lumbar one- two Lumbar two -three Lumbar  three-four Lumbar four-five Anterolateral decompression/fusion/posterior bilateral laminectomy Lumbar four-five Lumbar five Sacral one/posterior percutaneous fixation/mazor;  Surgeon: Kristeen Miss, MD;  Location: [place];  Service: Neurosurgery;  Laterality: N/A;   APPENDECTOMY     APPLICATION OF ROBOTIC ASSISTANCE FOR SPINAL PROCEDURE N/A 05/06/2018   Procedure: APPLICATION OF ROBOTIC ASSISTANCE FOR SPINAL PROCEDURE;  Surgeon: Kristeen Miss, MD;  Location: [place];  Service: Neurosurgery;  Laterality: N/A;   BILATERAL CARPAL TUNNEL RELEASE     3 screws in right hand, thumb, middle finger, and index finger,    CARDIAC CATHETERIZATION N/A 01/16/2015   Procedure: Left Heart Cath and Coronary Angiography;  Surgeon: Belva Crome, MD;  Location: [place] [facility];  Service: Cardiovascular;  Laterality: N/A;   CHOLECYSTECTOMY OPEN  1970'S   COLONOSCOPY  2018   CYSTOSCOPY WITH RETROGRADE PYELOGRAM, URETEROSCOPY AND STENT  PLACEMENT N/A 02/13/2013   Procedure: CYSTOSCOPY WITH RETROGRADE PYELOGRAM, URETEROSCOPY AND LITHOTRIPSY ;  Surgeon: Claybon Jabs, MD;  [facility];  Service: Urology;  Laterality: N/A;   CYSTOSCOPY WITH URETEROSCOPY AND STENT PLACEMENT Left 04/17/2019   Procedure: CYSTOSCOPY WITH LEFT  RETROGRADE PYELOGRAM, LEFT URETEROSCOPY, HOLMIUM LASER AND LEFT STENT PLACEMENT;  Surgeon: Kathie Rhodes, MD;  [facility];  Service: Urology;  Laterality: Left;   CYSTOSCOPY/URETEROSCOPY/HOLMIUM LASER/STENT PLACEMENT Right 11/22/2017   Procedure: CYSTOSCOPY/URETEROSCOPY/RETROGRADE PYELOGRAM/HOLMIUM LASER/STENT PLACEMENT;  Surgeon: Kathie Rhodes, MD;  Location: [facility];  Service: Urology;  Laterality: Right;   EXTRACORPOREAL SHOCK WAVE LITHOTRIPSY Right 05-16-2012   FINGER ARTHROSCOPY WITH CARPOMETACARPEL [facility]) ARTHROPLASTY     HOLMIUM LASER APPLICATION Right 10/19/7024   Procedure: HOLMIUM LASER APPLICATION;  Surgeon: Claybon Jabs, MD;  Location: [facility];  Service: Urology;  Laterality: Right;   IR URETERAL STENT LEFT NEW ACCESS W/O SEP NEPHROSTOMY CATH  02/13/2019   KNEE ARTHROSCOPY Bilateral    LUMBAR LAMINECTOMY  X2  1960'S   LUMBAR PERCUTANEOUS PEDICLE SCREW 4 LEVEL N/A 05/06/2018   Procedure: LUMBAR PERCUTANEOUS PEDICLE SCREW LUMBAR ONE TO LUMBAR FIVE WITH MAZOR ;POSTERIOR LATERAL LAMINECTOMY LUMBAR FOUR-FIVE,FIVE-SACRAL ONE;  Surgeon: Kristeen Miss, MD;  Location: [place];  Service: Neurosurgery;  Laterality: N/A;   NASAL SEPTUM SURGERY  1970'S   NEPHROLITHOTOMY Left 02/20/2019   Procedure: NEPHROLITHOTOMY PERCUTANEOUS;  Surgeon: Kathie Rhodes, MD;  [facility];  Service: Urology;  Laterality: Left;   SHOULDER OPEN ROTATOR CUFF REPAIR Bilateral LEFT 01-11-2001/   RIGHT 07-21-2002   TOTAL KNEE ARTHROPLASTY  04/13/2012   Procedure: TOTAL KNEE ARTHROPLASTY;  Surgeon: Gearlean Alf, MD;  [facility];  Service: Orthopedics;  Laterality: Left;   TOTAL KNEE ARTHROPLASTY  08/08/2012   Procedure: TOTAL KNEE ARTHROPLASTY;  Surgeon: Gearlean Alf, MD;  [facility];  Service: Orthopedics;  Laterality: Right;   TRANSTHORACIC ECHOCARDIOGRAM  03-29-2011   MODERATE LVH/ EF 60-65%/ MILD MR   URETEROSCOPY WITH HOLMIUM LASER LITHOTRIPSY Left 06/19/2019   Procedure: URETEROSCOPY WITH HOLMIUM LASER LITHOTRIPSY/INSERTION OF DOUBLE J STENT;  Surgeon: Kathie Rhodes, MD;  [facility];  Service: Urology;  Laterality: Left;   VAGINAL HYSTERECTOMY  64'S    Family History  Problem Relation Age of Onset   Lung cancer Mother    Diabetes type II Mother    Lung cancer Father    Diabetes Sister    Hypertension Sister     Hypertension Brother    Diabetes Brother    Heart disease Brother    Diabetes Brother    Hypertension Brother    Heart disease Brother      Social History   Tobacco Use   Smoking status: Never   Smokeless tobacco: Never  Vaping Use   Vaping Use: Never used  Substance Use Topics   Alcohol use: No   Drug use: No    Current Outpatient Medications on File Prior to Visit  Medication Sig Dispense Refill   aspirin EC 81 MG tablet Take 81 mg by mouth daily. Swallow whole.     diclofenac sodium (VOLTAREN) 1 % GEL Apply 2 g topically 3 (three) times daily as needed (back pain).      diltiazem (CARDIZEM CD) 120 MG 24 hr capsule TAKE 1 CAPSULE(120 MG) BY MOUTH DAILY 90 capsule 0   montelukast (SINGULAIR) 10 MG tablet Take 1 tablet (10 mg total) by mouth at bedtime. 30 tablet 1  No current facility-administered medications on file prior to visit.     Review of Systems  Constitutional:  Positive for fatigue. Negative for fever.  HENT: Negative.    Eyes: Negative.   Respiratory:  Positive for shortness of breath (intermittent). Negative for cough.   Cardiovascular:  Positive for chest pain.  Gastrointestinal:  Positive for constipation. Negative for abdominal pain, diarrhea and nausea.  Genitourinary:  Positive for frequency (at night). Negative for dysuria.  Musculoskeletal:  Positive for back pain.  Skin: Negative.   Neurological: Negative.   Psychiatric/Behavioral: Negative.          Objective:    BP (!) 150/80 (BP Location: Left Arm, Patient Position: Sitting, Cuff Size: Large)   Pulse 77   Temp (!) 96.6 F (35.9 C) (Temporal)   Ht '5\' 2"'$  (1.575 m)   Wt 260 lb 12.8 oz (118.3 kg)   SpO2 96%   BMI 47.70 kg/m   Wt Readings from Last 3 Encounters:  05/01/22 260 lb 12.8 oz (118.3 kg)  03/30/22 253 lb 3.2 oz (114.9 kg)  09/27/21 246 lb 0.5 oz (111.6 kg)    BP Readings from Last 3 Encounters:  05/01/22 (!) 150/80  03/30/22 122/70  09/27/21 (!) 150/70    Physical  Exam     Assessment & Plan:   Problem List Items Addressed This Visit   None Visit Diagnoses     Need for influenza vaccination    -  Primary   Relevant Orders   Flu Vaccine QUAD High Dose(Fluad)        Follow up plan: No follow-ups on file.

## 2022-05-01 NOTE — Progress Notes (Deleted)
42

## 2022-05-02 DIAGNOSIS — R0789 Other chest pain: Secondary | ICD-10-CM | POA: Insufficient documentation

## 2022-05-02 DIAGNOSIS — D649 Anemia, unspecified: Secondary | ICD-10-CM | POA: Insufficient documentation

## 2022-05-02 DIAGNOSIS — M810 Age-related osteoporosis without current pathological fracture: Secondary | ICD-10-CM | POA: Insufficient documentation

## 2022-05-02 LAB — CBC WITH DIFFERENTIAL/PLATELET
Absolute Monocytes: 454 cells/uL (ref 200–950)
Basophils Absolute: 80 cells/uL (ref 0–200)
Basophils Relative: 0.9 %
Eosinophils Absolute: 285 cells/uL (ref 15–500)
Eosinophils Relative: 3.2 %
HCT: 35.6 % (ref 35.0–45.0)
Hemoglobin: 12.1 g/dL (ref 11.7–15.5)
Lymphs Abs: 1851 cells/uL (ref 850–3900)
MCH: 29.6 pg (ref 27.0–33.0)
MCHC: 34 g/dL (ref 32.0–36.0)
MCV: 87 fL (ref 80.0–100.0)
MPV: 9.9 fL (ref 7.5–12.5)
Monocytes Relative: 5.1 %
Neutro Abs: 6230 cells/uL (ref 1500–7800)
Neutrophils Relative %: 70 %
Platelets: 363 10*3/uL (ref 140–400)
RBC: 4.09 10*6/uL (ref 3.80–5.10)
RDW: 12.6 % (ref 11.0–15.0)
Total Lymphocyte: 20.8 %
WBC: 8.9 10*3/uL (ref 3.8–10.8)

## 2022-05-02 LAB — COMPREHENSIVE METABOLIC PANEL
AG Ratio: 1.4 (calc) (ref 1.0–2.5)
ALT: 12 U/L (ref 6–29)
AST: 11 U/L (ref 10–35)
Albumin: 4.1 g/dL (ref 3.6–5.1)
Alkaline phosphatase (APISO): 100 U/L (ref 37–153)
BUN: 25 mg/dL (ref 7–25)
CO2: 27 mmol/L (ref 20–32)
Calcium: 9 mg/dL (ref 8.6–10.4)
Chloride: 103 mmol/L (ref 98–110)
Creat: 0.93 mg/dL (ref 0.60–1.00)
Globulin: 3 g/dL (calc) (ref 1.9–3.7)
Glucose, Bld: 169 mg/dL — ABNORMAL HIGH (ref 65–99)
Potassium: 4.1 mmol/L (ref 3.5–5.3)
Sodium: 140 mmol/L (ref 135–146)
Total Bilirubin: 0.6 mg/dL (ref 0.2–1.2)
Total Protein: 7.1 g/dL (ref 6.1–8.1)

## 2022-05-02 LAB — IRON,TIBC AND FERRITIN PANEL
%SAT: 17 % (calc) (ref 16–45)
Ferritin: 81 ng/mL (ref 16–288)
Iron: 58 ug/dL (ref 45–160)
TIBC: 345 mcg/dL (calc) (ref 250–450)

## 2022-05-02 LAB — LIPID PANEL
Cholesterol: 226 mg/dL — ABNORMAL HIGH (ref <200)
HDL: 85 mg/dL (ref >=50)
LDL Cholesterol (Calc): 113 mg/dL (calc) — ABNORMAL HIGH
Non-HDL Cholesterol (Calc): 141 mg/dL (calc) — ABNORMAL HIGH (ref <130)
Total CHOL/HDL Ratio: 2.7 (calc) (ref <5.0)
Triglycerides: 167 mg/dL — ABNORMAL HIGH (ref <150)

## 2022-05-02 LAB — VITAMIN B12: Vitamin B-12: 434 pg/mL (ref 200–1100)

## 2022-05-02 LAB — BRAIN NATRIURETIC PEPTIDE: Brain Natriuretic Peptide: 63 pg/mL (ref <100)

## 2022-05-02 LAB — HEMOGLOBIN A1C
Hgb A1c MFr Bld: 6.6 % of total Hgb — ABNORMAL HIGH (ref <5.7)
Mean Plasma Glucose: 143 mg/dL
eAG (mmol/L): 7.9 mmol/L

## 2022-05-02 LAB — VITAMIN D 25 HYDROXY (VIT D DEFICIENCY, FRACTURES): Vit D, 25-Hydroxy: 11 ng/mL — ABNORMAL LOW (ref 30–100)

## 2022-05-02 NOTE — Assessment & Plan Note (Signed)
She is currently in sinus rhythm today, heart rate regular.  Continue diltiazem 120 mg daily.  Continue collaboration recommendations from cardiology.

## 2022-05-02 NOTE — Assessment & Plan Note (Signed)
BMI 47.7. Discussed nutrition, exercise.

## 2022-05-02 NOTE — Assessment & Plan Note (Signed)
She is currently controlling her sugars with diet.  We will check A1c, CMP, CBC, urine microalbumin today.  States that she goes to an eye doctor once a year and sees Dr. Manuella Ghazi.  We will request eye records exams from them.  She denies numbness and tingling in her feet.  Follow-up in 3 months.

## 2022-05-02 NOTE — Progress Notes (Signed)
EKG interpreted by me on 05/01/22 showed normal sinus rhythm with first degree heart block with a heart rate of 77. No ST or T wave changes.

## 2022-05-02 NOTE — Assessment & Plan Note (Addendum)
Dexa scan in 2019 showed T score -2.5. She declines medication at this time. Check vitamin D today

## 2022-05-02 NOTE — Assessment & Plan Note (Signed)
She is not currently taking any statins today.  We will check lipid panel and adjust regimen based on results.

## 2022-05-02 NOTE — Assessment & Plan Note (Signed)
She also experiencing chest pressure that radiates to her neck and left shoulder for the past 3 days.  She called her cardiologist who recommended that she get a EKG done in her appointment today.  EKG shows normal sinus rhythm with first-degree heart block, similar to prior EKGs.  Encouraged her to reach out and schedule an appointment as soon as possible with her cardiologist.  Check CMP, CBC today.  We will also start her on Protonix 40 mg daily.  Discussed ER precautions.

## 2022-05-02 NOTE — Assessment & Plan Note (Signed)
Blood pressure slightly elevated today at 140/90.  We will have her continue her diltiazem 120 mg daily.  Encouraged her to start checking her blood pressure at home.

## 2022-05-02 NOTE — Assessment & Plan Note (Signed)
She has noticed a weight gain of 4 pounds over the last few days, shortness of breath with laying down, and chest pressure.  EKG showed normal sinus rhythm with first-degree heart block, no ST or T wave changes, similar to prior EKGs.  She is currently following with cardiology.  We will have her start furosemide 20 mg daily as needed for swelling, weight gain of 5 pounds in a week or 2 pounds overnight.  Encouraged her to schedule an appointment with her cardiologist with her ongoing orthopnea and chest pressure.

## 2022-05-02 NOTE — Assessment & Plan Note (Signed)
She has a history of frequent kidney stones and with her last surgery for kidney stone removal, they noted a hematoma on her right kidney.  She is currently following with [facility] and states that she had a CT scan done a couple of months ago.  We will request recent notes, and imaging from [facility].

## 2022-05-02 NOTE — Assessment & Plan Note (Signed)
She has a history of anemia. Will recheck CBC, iron panel, vitamin B12 today.

## 2022-05-04 ENCOUNTER — Encounter: Payer: Self-pay | Admitting: Nurse Practitioner

## 2022-05-06 ENCOUNTER — Other Ambulatory Visit: Payer: PPO

## 2022-05-11 DIAGNOSIS — N3000 Acute cystitis without hematuria: Secondary | ICD-10-CM | POA: Diagnosis not present

## 2022-05-29 DIAGNOSIS — N3000 Acute cystitis without hematuria: Secondary | ICD-10-CM | POA: Diagnosis not present

## 2022-06-01 ENCOUNTER — Encounter: Payer: Self-pay | Admitting: Nurse Practitioner

## 2022-06-08 ENCOUNTER — Other Ambulatory Visit: Payer: Self-pay | Admitting: Nurse Practitioner

## 2022-06-08 DIAGNOSIS — N3021 Other chronic cystitis with hematuria: Secondary | ICD-10-CM | POA: Diagnosis not present

## 2022-06-08 DIAGNOSIS — N3000 Acute cystitis without hematuria: Secondary | ICD-10-CM | POA: Diagnosis not present

## 2022-06-08 NOTE — Telephone Encounter (Signed)
Caller Name: pt Call back phone #: [phone]    MEDICATION(S):  pantoprazole   Days of Med Remaining:   Has the patient contacted their pharmacy (YES/NO)? yes What did pharmacy advise? It has to be put in  Preferred Pharmacy:  [facility], [place] [facility] [place] Phone: [phone]      ~~~Please advise patient/caregiver to allow 2-3 business days to process RX refills. Pt would like a 90 day supply because it is cheaper for her

## 2022-06-08 NOTE — Telephone Encounter (Signed)
Chart supports Rx Last OV: 04/2022 Next OV: 07/2022

## 2022-06-17 ENCOUNTER — Other Ambulatory Visit: Payer: Self-pay

## 2022-06-17 ENCOUNTER — Encounter: Payer: Self-pay | Admitting: Internal Medicine

## 2022-06-17 ENCOUNTER — Ambulatory Visit: Payer: PPO | Admitting: Internal Medicine

## 2022-06-17 ENCOUNTER — Ambulatory Visit (INDEPENDENT_AMBULATORY_CARE_PROVIDER_SITE_OTHER): Payer: PPO | Admitting: Internal Medicine

## 2022-06-17 VITALS — BP 141/78 | HR 80 | Temp 97.6°F | Ht 62.0 in | Wt 265.0 lb

## 2022-06-17 DIAGNOSIS — T148XXA Other injury of unspecified body region, initial encounter: Secondary | ICD-10-CM | POA: Diagnosis not present

## 2022-06-17 NOTE — Progress Notes (Addendum)
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[place])    Neurologic gait disorder    History of fusion of cervical spine    Acute blood loss anemia    Leukocytosis    Degenerative scoliosis 05/06/2018   Scoliosis deformity of spine 05/06/2018   Anxiety 09/26/2016   Paroxysmal atrial fibrillation ([place]) 12/20/2014   Essential hypertension 07/20/2014   Morbid obesity ([place]) 05/04/2014   Chronic diastolic heart failure ([place]) 05/03/2014   Ureteral calculus, right 05/16/2012   Hydronephrosis of right kidney 05/16/2012   Renal calculus, left 05/16/2012   OA (osteoarthritis) of knee 04/13/2012    Patient's Medications  New Prescriptions   No medications on file  Previous Medications   ASPIRIN EC 81 MG TABLET    Take 81 mg by mouth daily. Swallow whole.   DICLOFENAC SODIUM (VOLTAREN) 1 % GEL    Apply 2 g topically 3 (three) times daily as needed (back pain).    DILTIAZEM (CARDIZEM CD) 120 MG 24 HR CAPSULE    TAKE 1 CAPSULE(120 MG) BY MOUTH DAILY   FUROSEMIDE (LASIX) 20 MG TABLET    Take 1 tablet (20 mg total) by mouth daily as needed for fluid or edema.   MONTELUKAST (SINGULAIR) 10 MG TABLET    Take 1 tablet (10 mg total) by mouth at bedtime.   PANTOPRAZOLE (PROTONIX) 40 MG TABLET    TAKE 1 TABLET(40 MG) BY MOUTH DAILY  Modified Medications   No medications on file  Discontinued Medications   No medications on file    Subjective: 76 year old female with past medical history of arthritis, A-fib, carpal tunnel, CHF,  renal stones, obesity referred from [facility] urology for recurrent UTI, Dr. Louis Meckel.  She was last seen outpatient office on 11/27.  Noted that patient developed obstructive kidney stones last March.  She had a subsequent ureteroscopy and developed spontaneous hemorrhage and perinephric hematoma.  She has history of recurrent UTIs since that time with concern for infection of the hematoma.  She has been on and off antibiotics for the last 9 months.  Appears that the symptoms are pretty persistent.  Last couple of urine cultures including 11/29 and 11/19 have grown mixed urogenital flora.  Suppressive regimens have included Keflex but that was stopped due to concern for hair loss issues.  At some point she was on Macrobid for chronic suppression.  Now she is on Bactrim for chronic suppression.  She has a medical diagnosis of mixed urinary incontinence.  Mild incontinence with coughing and sneezing but occasional with also have significant urgency. CT abdomen pelvis with contrast on 10/6 showed persistent fluid collection about the posterior inferior pole of the right kidney measuring 2.7 X2.2 centimeter, residual of prior hemorrhage which extends majority of masslike appearance seen by prior  noncontrasted CT. Non obstructive b/l nephrolithiasis. No hydronephrosis Urine cultures as listed below: 11/29 mixed growth, multiple sp.  11/19 mixed growth, multiple sp. 9/21 E. coli resistant to Cipro Macrobid Levaquin 8/2 Ecoli 6/14 mixed growth, multiple sp. 09/13/2021 mixed urogenital flora  10/22/23/2021 E. coli resistant to ampicillin, intermediate to Unasyn, resistant to Cipro, resistant to Levaquin 24/2022 mixed urogenital flora 09/26/2020 mixed urogenital flora Today 06/17/22: She reports that her symptoms of mild dysuria, frequency, itching have not changed for the past few months.  She does report increased right flank pain.  She denies fevers and chills.  She reports that she has been off and on multiple  antibiotics for suppression.  She does not recall the name of the last suppressive antibiotic she was believes it was a medical Macrobid.  Notes that she stopped taking it 1 week prior to last urology appointment.  Review of Systems: Review of Systems  All other systems reviewed and are negative.   Past Medical History:  Diagnosis Date   Acute hypoxemic respiratory failure ([place]) 10/24/2016   Arthritis    both knees   Atrial fibrillation, chronic (HCC)    Carpal tunnel syndrome, bilateral    both hands go to sleep at times from elbows down   Chronic diastolic heart failure (HCC)    DDD (degenerative disc disease), lumbosacral    Diabetes mellitus without complication (HCC)    Dyspnea    during chronic bronchitis, resolved at this time   First degree AV block    Hematuria 11/15/2017   History of kidney stones    History of rib fracture 03/2011   Lumbar stenosis    Obesity    Osteoporosis    Postlaminectomy syndrome    Pre-diabetes    Renal calculi    BILATERAL   Right ureteral stone    Rotator cuff tear, left    Rotator cuff tear, right    UTI (urinary tract infection)     Social History   Tobacco Use   Smoking status: Never   Smokeless tobacco: Never  Vaping Use   Vaping Use: Never used  Substance Use Topics   Alcohol use: No   Drug use: No    Family History  Problem Relation Age of Onset   Lung cancer Mother    Diabetes type II Mother    Lung cancer Father    Diabetes Sister    Hypertension Sister    Hypertension Brother    Diabetes Brother    Heart disease Brother    Diabetes Brother    Hypertension Brother    Heart disease Brother     Allergies  Allergen Reactions   Contrast Media [Iodinated Contrast Media] Hives and Rash    Omnipaque.  Rash, itching   Cipro [Ciprofloxacin Hcl] Nausea And Vomiting    Pt reports she thinks she is allergic and reports its caused her nausea, anxious, and weakness. Pt unsure.   Doxycycline Nausea And Vomiting and  Other (See Comments)   Gabapentin Other (See Comments)    Too drowsy   Metformin Hcl Diarrhea and Other (See Comments)   Pregabalin Other (See Comments)    Too drowsy   Semaglutide(0.25 Or 0.'5mg'$ -Dos) Other (See Comments)    Stomach upset   Ancef [Cefazolin] Rash    Rash to ancef vs omniopaue contrast   Augmentin [Amoxicillin-Pot Clavulanate] Itching, Nausea And Vomiting and Rash    Did it involve swelling of the face/tongue/throat, SOB, or low BP? Unknown Did it involve sudden or severe rash/hives, skin peeling, or any reaction on the inside of your mouth or nose? Yes Did you need to seek medical attention at a hospital or doctor's office? No When did it last happen? 2020      If all  above answers are "NO", may proceed with cephalosporin use.    Sulfa Antibiotics Itching, Nausea And Vomiting, Rash and Other (See Comments)   Tape Swelling    Adhesive Tape    Health Maintenance  Topic Date Due   Hepatitis C Screening  Never done   Zoster Vaccines- Shingrix (1 of 2) Never done   OPHTHALMOLOGY EXAM  05/30/2022   Diabetic kidney evaluation - Urine ACR  10/25/2022   HEMOGLOBIN A1C  10/31/2022   Medicare Annual Wellness (AWV)  11/01/2022   Diabetic kidney evaluation - GFR measurement  05/02/2023   FOOT EXAM  05/02/2023   DTaP/Tdap/Td (2 - Tdap) 04/18/2032   Pneumonia Vaccine 8+ Years old  Completed   INFLUENZA VACCINE  Completed   DEXA SCAN  Completed   HPV VACCINES  Aged Out   COVID-19 Vaccine  Discontinued    Objective:  There were no vitals filed for this visit. There is no height or weight on file to calculate BMI.  Physical Exam Constitutional:      Appearance: Normal appearance.  HENT:     Head: Normocephalic and atraumatic.     Right Ear: Tympanic membrane normal.     Left Ear: Tympanic membrane normal.     Nose: Nose normal.     Mouth/Throat:     Mouth: Mucous membranes are moist.  Eyes:     Extraocular Movements: Extraocular movements intact.      Conjunctiva/sclera: Conjunctivae normal.     Pupils: Pupils are equal, round, and reactive to light.  Cardiovascular:     Rate and Rhythm: Normal rate and regular rhythm.     Heart sounds: No murmur heard.    No friction rub. No gallop.  Pulmonary:     Effort: Pulmonary effort is normal.     Breath sounds: Normal breath sounds.  Abdominal:     General: Abdomen is flat.     Palpations: Abdomen is soft.  Musculoskeletal:        General: Normal range of motion.  Skin:    General: Skin is warm and dry.  Neurological:     General: No focal deficit present.     Mental Status: She is alert and oriented to person, place, and time.  Psychiatric:        Mood and Affect: Mood normal.     Lab Results Lab Results  Component Value Date   WBC 8.9 05/01/2022   HGB 12.1 05/01/2022   HCT 35.6 05/01/2022   MCV 87.0 05/01/2022   PLT 363 05/01/2022    Lab Results  Component Value Date   CREATININE 0.93 05/01/2022   BUN 25 05/01/2022   NA 140 05/01/2022   K 4.1 05/01/2022   CL 103 05/01/2022   CO2 27 05/01/2022    Lab Results  Component Value Date   ALT 12 05/01/2022   AST 11 05/01/2022   ALKPHOS 67 09/27/2021   BILITOT 0.6 05/01/2022    Lab Results  Component Value Date   CHOL 226 (H) 05/01/2022   HDL 85 05/01/2022   LDLCALC 113 (H) 05/01/2022   TRIG 167 (H) 05/01/2022   CHOLHDL 2.7 05/01/2022   No results found for: "LABRPR", "RPRTITER" No results found for: "HIV1RNAQUANT", "HIV1RNAVL", "CD4TABS"   A/P #Flank pain R>L #Hx of recurrent UTI in the setting of kidney stone and right renal hematoma #Hx of Mixed incontinence - Patient reports that her symptoms of urinary urgency/frequency, pruritus, mild dysuria started after her hospitalization in March for pyelonephritis/right renal  hematoma secondary to multiple factors/laser surgery.  At that hospitalization her urine cultures grew E. coli she was treated with ceftriaxone and discharged on Keflex to complete 10 days of  antibiotics.  -Pt has been off of suppressive antibiotics since 11/20(pt noted she stopped abx one week prior to last Urology appointment. Her symptoms of frequency/pruritus/mild dysuria have not changes. I suspect her symptoms are due to incontinence due to the following: her Urine Cx 11/19 and 1/29 grew mixed growth/multiple species which is c/w colonization in the setting of incontinence, Her symptoms of urgency/frequency and pruritus are unchanged while on abx and off antibiotics. -Of note per my record review pt has either had urine Cx+ mixed growth/muliple species or Ecoli. -I do think she needs a CT to look for possible underlying foci of infections as CT in October showed 2.7x2.2 cm fluid collection and pt has worsening right flank pain x 2 weeks. She denies fevers and chills. Plan: -CT AP -Hold off on suppressive antibiotics as her lower urinary symptoms of urgency/frequency +/1 dysuria are likely related to incontinence given findings discussed above -Counseled pt if she develops fevers to go to ED. If symptoms worsen then will plan to Rx bactrim while awaiting CT. Pt reports she does not recall bactrim allergy and is willing to take antibiotic(Ecoli which has grown in the past is S bactrim) -Follow-up with ID in one month  I have personally spent 75 minutes involved in face-to-face and non-face-to-face activities for this patient on the day of the visit. Professional time spent includes the following activities: Preparing to see the patient (review of tests), Obtaining and/or reviewing separately obtained history (admission/discharge record), Performing a medically appropriate examination and/or evaluation , Ordering medications/tests/procedures, referring and communicating with other health care professionals, Documenting clinical information in the EMR, Independently interpreting results (not separately reported), Communicating results to the patient/family/caregiver, Counseling and educating  the patient/family/caregiver and Care coordination (not separately reported).      Laurice Record, MD [facility] 06/17/2022, 1:29 PM

## 2022-06-17 NOTE — Addendum Note (Signed)
Addended byLaurice Record on: 06/17/2022 02:51 PM   Modules accepted: Level of Service

## 2022-06-24 ENCOUNTER — Other Ambulatory Visit (HOSPITAL_COMMUNITY): Payer: PPO

## 2022-06-29 ENCOUNTER — Ambulatory Visit (HOSPITAL_COMMUNITY)
Admission: RE | Admit: 2022-06-29 | Discharge: 2022-06-29 | Disposition: A | Discharge status: Home / Self Care | Payer: PPO | Source: Ambulatory Visit | Attending: Internal Medicine | Admitting: Internal Medicine

## 2022-06-29 DIAGNOSIS — I700 Atherosclerosis of aorta: Secondary | ICD-10-CM | POA: Insufficient documentation

## 2022-06-29 DIAGNOSIS — X58XXXA Exposure to other specified factors, initial encounter: Secondary | ICD-10-CM | POA: Insufficient documentation

## 2022-06-29 DIAGNOSIS — K5730 Diverticulosis of large intestine without perforation or abscess without bleeding: Secondary | ICD-10-CM | POA: Insufficient documentation

## 2022-06-29 DIAGNOSIS — T148XXA Other injury of unspecified body region, initial encounter: Secondary | ICD-10-CM | POA: Diagnosis not present

## 2022-06-29 DIAGNOSIS — N2889 Other specified disorders of kidney and ureter: Secondary | ICD-10-CM | POA: Diagnosis not present

## 2022-06-29 DIAGNOSIS — N200 Calculus of kidney: Secondary | ICD-10-CM | POA: Insufficient documentation

## 2022-06-29 DIAGNOSIS — N390 Urinary tract infection, site not specified: Secondary | ICD-10-CM | POA: Insufficient documentation

## 2022-07-01 ENCOUNTER — Other Ambulatory Visit: Payer: Self-pay | Admitting: Internal Medicine

## 2022-07-01 DIAGNOSIS — N2889 Other specified disorders of kidney and ureter: Secondary | ICD-10-CM

## 2022-07-01 NOTE — Progress Notes (Signed)
Called pt and made her aware of CT results as well as MR ordered. She may need sedation for MRI.

## 2022-07-09 DIAGNOSIS — S61219A Laceration without foreign body of unspecified finger without damage to nail, initial encounter: Secondary | ICD-10-CM | POA: Diagnosis not present

## 2022-07-09 DIAGNOSIS — M13849 Other specified arthritis, unspecified hand: Secondary | ICD-10-CM | POA: Diagnosis not present

## 2022-07-09 DIAGNOSIS — M79641 Pain in right hand: Secondary | ICD-10-CM | POA: Diagnosis not present

## 2022-07-09 DIAGNOSIS — R52 Pain, unspecified: Secondary | ICD-10-CM | POA: Diagnosis not present

## 2022-07-09 DIAGNOSIS — M13841 Other specified arthritis, right hand: Secondary | ICD-10-CM | POA: Diagnosis not present

## 2022-07-15 ENCOUNTER — Telehealth: Payer: Self-pay | Admitting: Cardiology

## 2022-07-15 NOTE — Telephone Encounter (Signed)
Dr. Martinique approved to become this patient's new provider from Dr. Tamala Julian at [facility] (retiring). 07-15-2022 Noreene Larsson)

## 2022-07-21 ENCOUNTER — Other Ambulatory Visit: Payer: Self-pay | Admitting: Internal Medicine

## 2022-07-21 MED ORDER — DIAZEPAM 2 MG PO TABS: 2.0000 mg | ORAL_TABLET | Freq: Once | ORAL | 0 refills | Status: AC

## 2022-07-21 NOTE — Progress Notes (Signed)
Valium x 1 for MRI. Omnipaque allergy. MRI uses gadolinium, no gadolinium allergy noted.

## 2022-07-27 ENCOUNTER — Ambulatory Visit (HOSPITAL_COMMUNITY)
Admission: RE | Admit: 2022-07-27 | Discharge: 2022-07-27 | Disposition: A | Discharge status: Home / Self Care | Payer: PPO | Source: Ambulatory Visit | Attending: Internal Medicine | Admitting: Internal Medicine

## 2022-07-27 DIAGNOSIS — Q600 Renal agenesis, unilateral: Secondary | ICD-10-CM | POA: Diagnosis not present

## 2022-07-27 DIAGNOSIS — K469 Unspecified abdominal hernia without obstruction or gangrene: Secondary | ICD-10-CM | POA: Diagnosis not present

## 2022-07-27 DIAGNOSIS — N2889 Other specified disorders of kidney and ureter: Secondary | ICD-10-CM | POA: Insufficient documentation

## 2022-07-27 DIAGNOSIS — K760 Fatty (change of) liver, not elsewhere classified: Secondary | ICD-10-CM | POA: Diagnosis not present

## 2022-07-27 MED ORDER — GADOBUTROL 1 MMOL/ML IV SOLN
10.0000 mL | Freq: Once | INTRAVENOUS | Status: AC | PRN
  Administered 2022-07-27: 10 mL via INTRAVENOUS

## 2022-07-30 ENCOUNTER — Telehealth: Payer: Self-pay

## 2022-07-30 NOTE — Telephone Encounter (Signed)
Patient called wanting to discuss MRI results. Will route to provider.   Beryle Flock, RN

## 2022-08-03 ENCOUNTER — Encounter: Payer: Self-pay | Admitting: Nurse Practitioner

## 2022-08-03 ENCOUNTER — Ambulatory Visit (INDEPENDENT_AMBULATORY_CARE_PROVIDER_SITE_OTHER): Payer: PPO | Admitting: Nurse Practitioner

## 2022-08-03 VITALS — BP 126/80 | HR 74 | Temp 96.5°F | Ht 62.0 in | Wt 264.8 lb

## 2022-08-03 DIAGNOSIS — I480 Paroxysmal atrial fibrillation: Secondary | ICD-10-CM

## 2022-08-03 DIAGNOSIS — I10 Essential (primary) hypertension: Secondary | ICD-10-CM

## 2022-08-03 DIAGNOSIS — S37011D Minor contusion of right kidney, subsequent encounter: Secondary | ICD-10-CM | POA: Diagnosis not present

## 2022-08-03 DIAGNOSIS — N898 Other specified noninflammatory disorders of vagina: Secondary | ICD-10-CM

## 2022-08-03 DIAGNOSIS — R82998 Other abnormal findings in urine: Secondary | ICD-10-CM

## 2022-08-03 DIAGNOSIS — E1169 Type 2 diabetes mellitus with other specified complication: Secondary | ICD-10-CM | POA: Diagnosis not present

## 2022-08-03 DIAGNOSIS — M5416 Radiculopathy, lumbar region: Secondary | ICD-10-CM | POA: Diagnosis not present

## 2022-08-03 DIAGNOSIS — E559 Vitamin D deficiency, unspecified: Secondary | ICD-10-CM

## 2022-08-03 DIAGNOSIS — I5032 Chronic diastolic (congestive) heart failure: Secondary | ICD-10-CM

## 2022-08-03 DIAGNOSIS — E669 Obesity, unspecified: Secondary | ICD-10-CM

## 2022-08-03 LAB — POCT URINALYSIS DIPSTICK
Glucose, UA: NEGATIVE
Ketones, UA: NEGATIVE
Protein, UA: POSITIVE — AB
Spec Grav, UA: 1.02 (ref 1.010–1.025)
Urobilinogen, UA: NEGATIVE E.U./dL — AB
pH, UA: 5.5 (ref 5.0–8.0)

## 2022-08-03 LAB — MICROALBUMIN / CREATININE URINE RATIO
Creatinine,U: 256.6 mg/dL
Microalb Creat Ratio: 10.2 mg/g (ref 0.0–30.0)
Microalb, Ur: 26.1 mg/dL — ABNORMAL HIGH (ref 0.0–1.9)

## 2022-08-03 MED ORDER — GABAPENTIN 100 MG PO CAPS: 100.0000 mg | ORAL_CAPSULE | Freq: Three times a day (TID) | ORAL | 3 refills | Status: DC | PRN

## 2022-08-03 MED ORDER — VITAMIN D (ERGOCALCIFEROL) 1.25 MG (50000 UNIT) PO CAPS: 50000.0000 [IU] | ORAL_CAPSULE | ORAL | 0 refills | Status: DC

## 2022-08-03 NOTE — Assessment & Plan Note (Signed)
Chronic, not controlled.  She is having a lot of pain radiating down to her left leg.  Will have her restart gabapentin 100 mg 3 times a day as needed.  She states that she was able to tolerate this lower dose in the past.  Follow-up in 3 months.

## 2022-08-03 NOTE — Assessment & Plan Note (Signed)
BMI 48.4.  Discussed nutrition and exercise.  Recommend weight loss especially since MRI showed diffuse hepatic steatosis.

## 2022-08-03 NOTE — Patient Instructions (Signed)
It was great to see you!  You can use monistat OTC cream on your vaginal area if you are having itching.   Start gabapentin 1 capsule 3 times a day as needed.   Start vitamin D once a week for 12 weeks, then take an over the counter supplement 2,000 units daily.   Try to limit red meat, fried/fatty foods, and cheese.   Let's follow-up in 3 months, sooner if you have concerns.  If a referral was placed today, you will be contacted for an appointment. Please note that routine referrals can sometimes take up to 3-4 weeks to process. Please call our office if you haven't heard anything after this time frame.  Take care,  Vance Peper, NP

## 2022-08-03 NOTE — Assessment & Plan Note (Signed)
Chronic, stable.  BP today 126/80.  Continue diltiazem 120 mg daily.

## 2022-08-03 NOTE — Assessment & Plan Note (Signed)
Will have her start a vitamin D supplement 50,000 units weekly for 12 weeks, then start a vitamin D supplement 2000 units daily over-the-counter

## 2022-08-03 NOTE — Assessment & Plan Note (Signed)
Chronic, stable.  Last A1c was 6.6% and is well-controlled with diet.  She has an eye appointment in March.  Will check urine microalbumin today.  Follow-up in 3 months.

## 2022-08-03 NOTE — Assessment & Plan Note (Signed)
Chronic, stable.  Heart rate regular on exam.  Continue diltiazem 120 mg daily.  Continue collaboration recommendations from cardiology.

## 2022-08-03 NOTE — Assessment & Plan Note (Signed)
Chronic, stable.  She is euvolemic on exam today.  She can continue Lasix 20 mg daily as needed.

## 2022-08-03 NOTE — Progress Notes (Signed)
Established Patient Office Visit  Subjective   Patient ID: [name], female    DOB: [birth year]  Age: 77 y.o. MRN: [id number]  Chief Complaint  Patient presents with   Follow-up    3 month follow up  , pt had mri done  of kidney  wants to know the results , she hasn't anything regarding the results , no refills     HPI  CATE ORAVEC is here to follow-up on her hematoma, chronic back pain, and diabetes.   She is still following with urology and also recently saw infectious disease.  She had another CT followed by an MRI of her abdomen.  MRI shows it to be less likely cancerous, may be normal variant of her kidney along with a hematoma.  Recommend a follow-up CT in 3 months.  She states that she is still having ongoing back pain, cloudy urine, and vaginal itching.  The urinary symptoms seem to come and go.  She is also having ongoing pain that radiates down her left leg.  She states that this started after back surgery and is still ongoing.  She has been using Voltaren but it has not been helping.  She states that after her surgery she was given gabapentin which a 300 mg dose made her cry, however she was able to tolerate that 100 mg dose.  She is thinking that she may need to restart this.  She does not remember being told that she has diabetes.  She denies chest pain, shortness of breath, numbness and tingling in her feet and hands.    ROS See pertinent positives and negatives per HPI.    Objective:     BP 126/80   Pulse 74   Temp (!) 96.5 F (35.8 C)   Ht '5\' 2"'$  (1.575 m)   Wt 264 lb 12.8 oz (120.1 kg)   SpO2 97%   BMI 48.43 kg/m    Physical Exam Vitals and nursing note reviewed.  Constitutional:      General: She is not in acute distress.    Appearance: Normal appearance.  HENT:     Head: Normocephalic.  Eyes:     Conjunctiva/sclera: Conjunctivae normal.  Cardiovascular:     Rate and Rhythm: Normal rate and regular rhythm.     Pulses: Normal pulses.      Heart sounds: Normal heart sounds.  Pulmonary:     Effort: Pulmonary effort is normal.     Breath sounds: Normal breath sounds.  Abdominal:     Palpations: Abdomen is soft.     Tenderness: There is no abdominal tenderness.  Musculoskeletal:        General: No swelling or tenderness.     Cervical back: Normal range of motion.  Skin:    General: Skin is warm.  Neurological:     General: No focal deficit present.     Mental Status: She is alert and oriented to person, place, and time.  Psychiatric:        Mood and Affect: Mood normal.        Behavior: Behavior normal.        Thought Content: Thought content normal.        Judgment: Judgment normal.     Results for orders placed or performed in visit on 08/03/22  POCT urinalysis dipstick  Result Value Ref Range   Color, UA yellow    Clarity, UA clear    Glucose, UA Negative Negative   Bilirubin, UA  1+    Ketones, UA neg    Spec Grav, UA 1.020 1.010 - 1.025   Blood, UA 3+    pH, UA 5.5 5.0 - 8.0   Protein, UA Positive (A) Negative   Urobilinogen, UA negative (A) 0.2 or 1.0 E.U./dL   Nitrite, UA postivie    Leukocytes, UA Large (3+) (A) Negative   Appearance     Odor        The 10-year ASCVD risk score (Arnett DK, et al., 2019) is: 44.1%    Assessment & Plan:   Problem List Items Addressed This Visit       Cardiovascular and Mediastinum   Chronic diastolic heart failure (HCC)    Chronic, stable.  She is euvolemic on exam today.  She can continue Lasix 20 mg daily as needed.      Essential hypertension    Chronic, stable.  BP today 126/80.  Continue diltiazem 120 mg daily.      Paroxysmal atrial fibrillation (HCC)    Chronic, stable.  Heart rate regular on exam.  Continue diltiazem 120 mg daily.  Continue collaboration recommendations from cardiology.        Endocrine   Diabetes mellitus type 2 in obese (HCC)    Chronic, stable.  Last A1c was 6.6% and is well-controlled with diet.  She has an eye  appointment in March.  Will check urine microalbumin today.  Follow-up in 3 months.      Relevant Orders   Microalbumin / creatinine urine ratio     Nervous and Auditory   Lumbar radiculopathy - Primary    Chronic, not controlled.  She is having a lot of pain radiating down to her left leg.  Will have her restart gabapentin 100 mg 3 times a day as needed.  She states that she was able to tolerate this lower dose in the past.  Follow-up in 3 months.      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  Recommend follow-up CT scan in 3 months.  She is currently following with urology and infectious disease.  Continue collaboration recommendations from the specialist.  She is considering possibly getting a second opinion from a different urologist.  Encouraged her to reach out if she needs a referral for this.        Other   Morbid obesity (HCC)    BMI 48.4.  Discussed nutrition and exercise.  Recommend weight loss especially since MRI showed diffuse hepatic steatosis.      Vitamin D deficiency    Will have her start a vitamin D supplement 50,000 units weekly for 12 weeks, then start a vitamin D supplement 2000 units daily over-the-counter      Other Visit Diagnoses     Vaginal itching       She can try monistat cream OTC as this may be related to some incontinence. Will check UA today as well.   Relevant Orders   POCT urinalysis dipstick (Completed)   Urine Culture   Urine white blood cells increased       Will add on urine culture and treat based on results.   Relevant Orders   Urine Culture       Return in about 3 months (around 11/02/2022) for Diabetes, back pain, kidneys.    Charyl Dancer, NP

## 2022-08-03 NOTE — Assessment & Plan Note (Signed)
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  Recommend follow-up CT scan in 3 months.  She is currently following with urology and infectious disease.  Continue collaboration recommendations from the specialist.  She is considering possibly getting a second opinion from a different urologist.  Encouraged her to reach out if she needs a referral for this.

## 2022-08-04 ENCOUNTER — Telehealth: Payer: Self-pay | Admitting: Internal Medicine

## 2022-08-04 NOTE — Telephone Encounter (Signed)
Called to discuss MRI results, Triage could you fax the MR results to nephrology -[facility] Dr. Louis Meckel I gave pt an appt with myself on 2/5 to discuss possible heme-onc referral

## 2022-08-04 NOTE — Telephone Encounter (Signed)
Results faxed to Dr. Louis Meckel at [phone].   Beryle Flock, RN

## 2022-08-05 ENCOUNTER — Telehealth: Payer: Self-pay | Admitting: Nurse Practitioner

## 2022-08-05 LAB — URINE CULTURE
MICRO NUMBER:: 14455108
SPECIMEN QUALITY:: ADEQUATE

## 2022-08-05 MED ORDER — CEPHALEXIN 500 MG PO CAPS: 500.0000 mg | ORAL_CAPSULE | Freq: Two times a day (BID) | ORAL | 0 refills | Status: DC

## 2022-08-05 MED ORDER — SULFAMETHOXAZOLE-TRIMETHOPRIM 800-160 MG PO TABS: 1.0000 | ORAL_TABLET | Freq: Two times a day (BID) | ORAL | 0 refills | Status: DC

## 2022-08-05 NOTE — Telephone Encounter (Signed)
Pt called back and declined to schedule AWV

## 2022-08-05 NOTE — Addendum Note (Signed)
Addended by: Vance Peper A on: 08/05/2022 11:57 AM   Modules accepted: Orders

## 2022-08-05 NOTE — Telephone Encounter (Signed)
Left message for patient to call back and schedule Medicare Annual Wellness Visit (AWV) either virtually or in office. Left  my Herbie Drape number [phone]   DUE 07/13/2009 AWVI PER PALMETTO please schedule with Nurse Health Adviser   45 min for awv-i  in office appointments 30 min for awv-s & awv-i phone/virtual appointments

## 2022-08-06 ENCOUNTER — Encounter: Payer: Self-pay | Admitting: Nurse Practitioner

## 2022-08-06 DIAGNOSIS — N3021 Other chronic cystitis with hematuria: Secondary | ICD-10-CM | POA: Diagnosis not present

## 2022-08-06 DIAGNOSIS — N200 Calculus of kidney: Secondary | ICD-10-CM | POA: Diagnosis not present

## 2022-08-06 DIAGNOSIS — N3000 Acute cystitis without hematuria: Secondary | ICD-10-CM | POA: Diagnosis not present

## 2022-08-17 ENCOUNTER — Encounter: Payer: Self-pay | Admitting: Internal Medicine

## 2022-08-17 ENCOUNTER — Ambulatory Visit: Payer: PPO | Admitting: Internal Medicine

## 2022-08-17 ENCOUNTER — Other Ambulatory Visit: Payer: Self-pay

## 2022-08-17 ENCOUNTER — Telehealth: Payer: Self-pay

## 2022-08-17 VITALS — BP 149/79 | HR 68 | Temp 97.8°F | Wt 264.0 lb

## 2022-08-17 DIAGNOSIS — N390 Urinary tract infection, site not specified: Secondary | ICD-10-CM

## 2022-08-17 NOTE — Progress Notes (Addendum)
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[place])    Neurologic gait disorder    History of fusion of cervical spine    Acute blood loss anemia    Leukocytosis    Degenerative scoliosis 05/06/2018   Scoliosis deformity of spine 05/06/2018   Anxiety 09/26/2016   Paroxysmal atrial fibrillation ([place]) 12/20/2014   Essential hypertension 07/20/2014   Morbid obesity ([place]) 05/04/2014   Chronic diastolic heart failure ([place]) 05/03/2014   Ureteral calculus, right 05/16/2012   Hydronephrosis of right kidney 05/16/2012   Renal calculus, left 05/16/2012   OA (osteoarthritis) of knee 04/13/2012    Patient's Medications  New Prescriptions   No medications on file  Previous Medications   ASPIRIN EC 81 MG TABLET    Take 81 mg by mouth daily. Swallow whole.   DICLOFENAC SODIUM (VOLTAREN) 1 % GEL    Apply 2 g topically 3 (three) times daily as needed (back pain).    DILTIAZEM (CARDIZEM CD) 120 MG 24 HR CAPSULE    TAKE 1 CAPSULE(120 MG) BY MOUTH DAILY   FUROSEMIDE (LASIX) 20 MG TABLET    Take 1 tablet (20 mg total) by mouth daily as needed for fluid or edema.   GABAPENTIN (NEURONTIN) 100 MG CAPSULE    Take 1 capsule (100 mg total) by mouth 3 (three) times daily as needed (leg pain, tingling).   MONTELUKAST (SINGULAIR) 10 MG TABLET    Take 1 tablet (10 mg total) by mouth at bedtime.   PANTOPRAZOLE (PROTONIX) 40 MG TABLET    TAKE 1 TABLET(40 MG) BY MOUTH DAILY   SULFAMETHOXAZOLE-TRIMETHOPRIM  (BACTRIM DS) 800-160 MG TABLET    Take 1 tablet by mouth 2 (two) times daily.   VITAMIN D, ERGOCALCIFEROL, (DRISDOL) 1.25 MG (50000 UNIT) CAPS CAPSULE    Take 1 capsule (50,000 Units total) by mouth every 7 (seven) days.  Modified Medications   No medications on file  Discontinued Medications   No medications on file    Subjective: 77 year old  female with past medical history of arthritis, A-fib, carpal tunnel, CHF, renal stones, obesity referred from [facility] urology for recurrent UTI, Dr. Louis Meckel.  She was last seen outpatient office on 11/27.  Noted that patient developed obstructive kidney stones last March.  She had a subsequent ureteroscopy and developed spontaneous hemorrhage and perinephric hematoma.  She has history of recurrent UTIs since that time with concern for infection of the hematoma.  She has been on and off antibiotics for the last 9 months.  Appears that the symptoms are pretty persistent.  Last couple of urine cultures including 11/29 and 11/19 have grown mixed urogenital flora.  Suppressive regimens have included Keflex but that was stopped due to concern for hair loss issues.  At some point she was on Macrobid for chronic suppression.  Now she is on Bactrim for chronic suppression.  She has a medical diagnosis of mixed urinary incontinence.  Mild incontinence with coughing and sneezing but occasional with also have  significant urgency. CT abdomen pelvis with contrast on 10/6 showed persistent fluid collection about the posterior inferior pole of the right kidney measuring 2.7 X2.2 centimeter, residual of prior hemorrhage which extends majority of masslike appearance seen by prior  noncontrasted CT. Non obstructive b/l nephrolithiasis. No hydronephrosis Urine cultures as listed below: 11/29 mixed growth, multiple sp.  11/19 mixed growth, multiple sp. 9/21 E. coli resistant to Cipro Macrobid Levaquin 8/2 Ecoli 6/14 mixed growth, multiple sp. 09/13/2021 mixed urogenital  flora 10/22/23/2021 E. coli resistant to ampicillin, intermediate to Unasyn, resistant to Cipro, resistant to Levaquin 24/2022 mixed urogenital flora 09/26/2020 mixed urogenital flora 06/17/22: She reports that her symptoms of mild dysuria, frequency, itching have not changed for the past few months.  She does report increased right flank pain.  She denies fevers and chills.  She reports that she has been off and on multiple antibiotics for suppression.  She does not recall the name of the last suppressive antibiotic she was believes it was a medical Macrobid.  Notes that she stopped taking it 1 week prior to last urology appointment.  Today 08/17/22: Doing well. No new complaint, no dysuria.   Review of Systems: Review of Systems  All other systems reviewed and are negative.   Past Medical History:  Diagnosis Date   Acute hypoxemic respiratory failure ([place]) 10/24/2016   Arthritis    both knees   Atrial fibrillation, chronic (HCC)    Carpal tunnel syndrome, bilateral    both hands go to sleep at times from elbows down   Chronic diastolic heart failure (HCC)    DDD (degenerative disc disease), lumbosacral    Diabetes mellitus without complication (HCC)    Dyspnea    during chronic bronchitis, resolved at this time   First degree AV block    Hematuria 11/15/2017   History of kidney stones    History of rib fracture 03/2011   Lumbar stenosis    Obesity    Osteoporosis    Postlaminectomy syndrome    Pre-diabetes    Renal calculi    BILATERAL   Right ureteral stone    Rotator cuff tear, left    Rotator cuff tear, right    UTI (urinary tract infection)     Social History   Tobacco Use   Smoking status: Never   Smokeless tobacco: Never  Vaping Use   Vaping Use: Never used  Substance Use Topics   Alcohol use: No   Drug use: No    Family History  Problem Relation Age of Onset   Lung cancer Mother    Diabetes type II Mother    Lung cancer Father    Diabetes Sister     Hypertension Sister    Hypertension Brother    Diabetes Brother    Heart disease Brother    Diabetes Brother    Hypertension Brother    Heart disease Brother     Allergies  Allergen Reactions   Contrast Media [Iodinated Contrast Media] Hives and Rash    Omnipaque.  Rash, itching   Cipro [Ciprofloxacin Hcl] Nausea And Vomiting    Pt reports she thinks she is allergic and reports its caused her nausea, anxious, and weakness. Pt unsure.   Doxycycline Nausea And Vomiting and Other (See Comments)   Gabapentin Other (See Comments)    Too drowsy   Metformin Hcl Diarrhea and Other (See Comments)   Pregabalin Other (See Comments)    Too drowsy   Semaglutide(0.25 Or 0.'5mg'$ -Dos) Other (See Comments)  Stomach upset   Ancef [Cefazolin] Rash    Rash to ancef vs omniopaue contrast   Augmentin [Amoxicillin-Pot Clavulanate] Itching, Nausea And Vomiting and Rash    Did it involve swelling of the face/tongue/throat, SOB, or low BP? Unknown Did it involve sudden or severe rash/hives, skin peeling, or any reaction on the inside of your mouth or nose? Yes Did you need to seek medical attention at a hospital or doctor's office? No When did it last happen? 2020      If all above answers are "NO", may proceed with cephalosporin use.    Sulfa Antibiotics Itching, Nausea And Vomiting, Rash and Other (See Comments)   Tape Swelling    Adhesive Tape    Health Maintenance  Topic Date Due   Hepatitis C Screening  Never done   Zoster Vaccines- Shingrix (1 of 2) Never done   OPHTHALMOLOGY EXAM  05/30/2022   HEMOGLOBIN A1C  10/31/2022   Medicare Annual Wellness (AWV)  11/01/2022   Diabetic kidney evaluation - eGFR measurement  05/02/2023   FOOT EXAM  05/02/2023   Diabetic kidney evaluation - Urine ACR  08/04/2023   DTaP/Tdap/Td (2 - Tdap) 04/18/2032   Pneumonia Vaccine 15+ Years old  Completed   INFLUENZA VACCINE  Completed   DEXA SCAN  Completed   HPV VACCINES  Aged Out   COVID-19 Vaccine   Discontinued    Objective:  There were no vitals filed for this visit. There is no height or weight on file to calculate BMI.  Physical Exam Constitutional:      Appearance: Normal appearance.  HENT:     Head: Normocephalic and atraumatic.     Right Ear: Tympanic membrane normal.     Left Ear: Tympanic membrane normal.     Nose: Nose normal.     Mouth/Throat:     Mouth: Mucous membranes are moist.  Eyes:     Extraocular Movements: Extraocular movements intact.     Conjunctiva/sclera: Conjunctivae normal.     Pupils: Pupils are equal, round, and reactive to light.  Cardiovascular:     Rate and Rhythm: Normal rate and regular rhythm.     Heart sounds: No murmur heard.    No friction rub. No gallop.  Pulmonary:     Effort: Pulmonary effort is normal.     Breath sounds: Normal breath sounds.  Abdominal:     General: Abdomen is flat.     Palpations: Abdomen is soft.  Musculoskeletal:        General: Normal range of motion.  Skin:    General: Skin is warm and dry.  Neurological:     General: No focal deficit present.     Mental Status: She is alert and oriented to person, place, and time.  Psychiatric:        Mood and Affect: Mood normal.     Lab Results Lab Results  Component Value Date   WBC 8.9 05/01/2022   HGB 12.1 05/01/2022   HCT 35.6 05/01/2022   MCV 87.0 05/01/2022   PLT 363 05/01/2022    Lab Results  Component Value Date   CREATININE 0.93 05/01/2022   BUN 25 05/01/2022   NA 140 05/01/2022   K 4.1 05/01/2022   CL 103 05/01/2022   CO2 27 05/01/2022    Lab Results  Component Value Date   ALT 12 05/01/2022   AST 11 05/01/2022   ALKPHOS 67 09/27/2021   BILITOT 0.6 05/01/2022    Lab Results  Component  Value Date   CHOL 226 (H) 05/01/2022   HDL 85 05/01/2022   LDLCALC 113 (H) 05/01/2022   TRIG 167 (H) 05/01/2022   CHOLHDL 2.7 05/01/2022   No results found for: "LABRPR", "RPRTITER" No results found for: "HIV1RNAQUANT", "HIV1RNAVL", "CD4TABS"    A/P #Flank pain R>L #Hx of recurrent UTI in the setting of kidney stone and right renal hematoma #Hx of Mixed incontinence - Patient reports that her symptoms of urinary urgency/frequency, pruritus, mild dysuria started after her hospitalization in March for pyelonephritis/right renal hematoma secondary to multiple factors/laser surgery.  At that hospitalization her urine cultures grew E. coli she was treated with ceftriaxone and discharged on Keflex to complete 10 days of antibiotics.  -Pt has been off of suppressive antibiotics since 11/20(pt noted she stopped abx one week prior to last Urology appointment. Her symptoms of frequency/pruritus/mild dysuria have not changes. I suspect her symptoms are due to incontinence due to the following: her Urine Cx 11/19 and 1/29 grew mixed growth/multiple species which is c/w colonization in the setting of incontinence, Her symptoms of urgency/frequency and pruritus are unchanged while on abx and off antibiotics. -Held off on suppressive antibiotics as her lower urinary symptoms of urgency/frequency +/1 dysuria are likely related to incontinence given findings discussed above -On 10/18 CT abdomen pelvis without contrast showed 4.4X 3.3 cm right renal mass in the setting of prior hemorrhage, recommended MRI.  Underlying malignancy in the setting of prior hematoma sonics noted. - MRI abdomen with and without contrast showed 3.7X 3.4 cm ill-defined irregular masslike nodularity in the right interpolar kidney consistent with normal underlying renal architecture no residual perinephric hematoma.  With no definite suspicious renal mass in the area.  Recommended multiphase CT of the abdomen with and without contrast in 3 months.  Mild diffuse hepatic steatosis. - Pt was seen by PCP on 1/22 , she stated she had sysuri , Urine CX+ Ecoli Tx bactirm x 7 days Plan: - Repeat multiphase CT in  3 months - Discussed with pt that she is colonized with [facility], and unless she has  symtoms of UTI including dysuria, abdominal pain, fever and chills that she does not need to treat UTI. As a noted please avoid collecting urine Cx unless pt has symptoms,  - Record relaease [facility]: per 08/06/22 noted, noted MRI findings and noted that not worried that it is a renal mass/malignancy. Plan to follow serial imagingg. - Follow-up in 3 months for imaging.    Laurice Record, MD [facility] 08/17/2022, 8:48 AM  I have personally spent 85 minutes involved in face-to-face and non-face-to-face activities for this patient on the day of the visit. Professional time spent includes the following activities: Preparing to see the patient (review of tests), Obtaining and/or reviewing separately obtained history (admission/discharge record), Performing a medically appropriate examination and/or evaluation , Ordering medications/tests/procedures, referring and communicating with other health care professionals, Documenting clinical information in the EMR, Independently interpreting results (not separately reported), Communicating results to the patient/family/caregiver, Counseling and educating the patient/family/caregiver and Care coordination (not separately reported).

## 2022-08-17 NOTE — Telephone Encounter (Signed)
Per Dr. Candiss Norse faxed signed medical release for patient last note at [facility].  Dr. Louis Meckel Phone:[phone] QNV:[phone] Leatrice Jewels, RMA

## 2022-08-26 ENCOUNTER — Telehealth: Payer: Self-pay | Admitting: Cardiology

## 2022-08-26 NOTE — Telephone Encounter (Signed)
Spoke with patient who reports she was over at Dr. Vanetta Shawl office with her husband and was very short of breath. She walked over to our office while in the building d/t this. She r/s her May appointment to Friday w/Emily NP -- offered appt today at 10am but she is already back home. He has h/o AFib. She reports that she will wake up at night short of breath, sit up in a chair and drink coffee and it improves. Advised to monitor caffeine intake with AFib. Last echo in 2018.  She has PRN lasix - she took one last night. Advised she take lasix every day until her appt on Friday. She reports a steady weight gain. Advised to bring a list of home weights to her appointment.   Dr. Amedeo Plenty told her the following: 1+ pitting edema in left leg Shortness of breath with exertion   Advised will send to [facility] NP as FYI and if any suggestions, will call back.

## 2022-08-26 NOTE — Telephone Encounter (Signed)
Pt states that she was in office this morning and was told to call Triage nurse due to results. Please advise

## 2022-08-28 ENCOUNTER — Ambulatory Visit: Payer: PPO | Attending: Nurse Practitioner | Admitting: Nurse Practitioner

## 2022-08-28 ENCOUNTER — Encounter: Payer: Self-pay | Admitting: Nurse Practitioner

## 2022-08-28 VITALS — BP 130/70 | HR 82 | Ht 62.0 in | Wt 267.6 lb

## 2022-08-28 DIAGNOSIS — R0609 Other forms of dyspnea: Secondary | ICD-10-CM | POA: Diagnosis not present

## 2022-08-28 DIAGNOSIS — I5032 Chronic diastolic (congestive) heart failure: Secondary | ICD-10-CM

## 2022-08-28 DIAGNOSIS — I480 Paroxysmal atrial fibrillation: Secondary | ICD-10-CM

## 2022-08-28 DIAGNOSIS — E1169 Type 2 diabetes mellitus with other specified complication: Secondary | ICD-10-CM

## 2022-08-28 DIAGNOSIS — E782 Mixed hyperlipidemia: Secondary | ICD-10-CM | POA: Diagnosis not present

## 2022-08-28 DIAGNOSIS — R600 Localized edema: Secondary | ICD-10-CM

## 2022-08-28 DIAGNOSIS — E669 Obesity, unspecified: Secondary | ICD-10-CM | POA: Diagnosis not present

## 2022-08-28 MED ORDER — POTASSIUM CHLORIDE CRYS ER 10 MEQ PO TBCR: 10.0000 meq | EXTENDED_RELEASE_TABLET | Freq: Every day | ORAL | 3 refills | Status: DC

## 2022-08-28 MED ORDER — FUROSEMIDE 40 MG PO TABS: 40.0000 mg | ORAL_TABLET | Freq: Every day | ORAL | 3 refills | Status: DC

## 2022-08-28 NOTE — Patient Instructions (Addendum)
Medication Instructions:  Lasix 60 mg daily for 3 days and then take 40 mg daily as directed Potassium Chloride 10 mEq daily  *If you need a refill on your cardiac medications before your next appointment, please call your pharmacy*   Lab Work: Your physician recommends that you complete lab work today CBC, BNP, BMET   If you have labs (blood work) drawn today and your tests are completely normal, you will receive your results only by: [place] (if you have MyChart) OR A paper copy in the mail If you have any lab test that is abnormal or we need to change your treatment, we will call you to review the results.   Testing/Procedures: Your physician has requested that you have an echocardiogram. Echocardiography is a painless test that uses sound waves to create images of your heart. It provides your doctor with information about the size and shape of your heart and how well your heart's chambers and valves are working. This procedure takes approximately one hour. There are no restrictions for this procedure. Please do NOT wear cologne, perfume, aftershave, or lotions (deodorant is allowed). Please arrive 15 minutes prior to your appointment time.    Follow-Up: At [facility], you and your health needs are our priority.  As part of our continuing mission to provide you with exceptional heart care, we have created designated Provider Care Teams.  These Care Teams include your primary Cardiologist (physician) and Advanced Practice Providers (APPs -  Physician Assistants and Nurse Practitioners) who all work together to provide you with the care you need, when you need it.  We recommend signing up for the patient portal called "MyChart".  Sign up information is provided on this After Visit Summary.  MyChart is used to connect with patients for Virtual Visits (Telemedicine).  Patients are able to view lab/test results, encounter notes, upcoming appointments, etc.  Non-urgent  messages can be sent to your provider as well.   To learn more about what you can do with MyChart, go to [email or web address].    Your next appointment:   2 week(s)  Provider:   Diona Browner, NP        Other Instructions

## 2022-08-28 NOTE — Progress Notes (Unsigned)
Office Visit    Patient Name: [name] Date of Encounter: 08/28/2022  Primary Care Provider:  Charyl Dancer, NP Primary Cardiologist:  [name], MD (Inactive)  Chief Complaint    77 year old female with a history of paroxysmal atrial fibrillation, first-degree AV block, chronic diastolic heart failure, hyperlipidemia, DDD, and type 2 diabetes who presents for follow-up related to atrial fibrillation.  Past Medical History    Past Medical History:  Diagnosis Date   Acute hypoxemic respiratory failure ([place]) 10/24/2016   Arthritis    both knees   Atrial fibrillation, chronic (HCC)    Carpal tunnel syndrome, bilateral    both hands go to sleep at times from elbows down   Chronic diastolic heart failure (HCC)    DDD (degenerative disc disease), lumbosacral    Diabetes mellitus without complication (HCC)    Dyspnea    during chronic bronchitis, resolved at this time   First degree AV block    Hematuria 11/15/2017   History of kidney stones    History of rib fracture 03/2011   Lumbar stenosis    Obesity    Osteoporosis    Postlaminectomy syndrome    Pre-diabetes    Renal calculi    BILATERAL   Right ureteral stone    Rotator cuff tear, left    Rotator cuff tear, right    UTI (urinary tract infection)    Past Surgical History:  Procedure Laterality Date   ANTERIOR CERVICAL DECOMP/DISCECTOMY FUSION  03-18-2011   C3 -- C7   ANTERIOR LATERAL LUMBAR FUSION 4 LEVELS N/A 05/06/2018   Procedure: Lumbar one- two Lumbar two -three Lumbar  three-four Lumbar four-five Anterolateral decompression/fusion/posterior bilateral laminectomy Lumbar four-five Lumbar five Sacral one/posterior percutaneous fixation/mazor;  Surgeon: Kristeen Miss, MD;  Location: [facility];  Service: Neurosurgery;  Laterality: N/A;   APPENDECTOMY     APPLICATION OF ROBOTIC ASSISTANCE FOR SPINAL PROCEDURE N/A 05/06/2018   Procedure: APPLICATION OF ROBOTIC ASSISTANCE FOR SPINAL PROCEDURE;   Surgeon: Kristeen Miss, MD;  Location: [facility];  Service: Neurosurgery;  Laterality: N/A;   BILATERAL CARPAL TUNNEL RELEASE     3 screws in right hand, thumb, middle finger, and index finger,    CARDIAC CATHETERIZATION N/A 01/16/2015   Procedure: Left Heart Cath and Coronary Angiography;  Surgeon: Belva Crome, MD;  Location: [facility];  Service: Cardiovascular;  Laterality: N/A;   CHOLECYSTECTOMY OPEN  1970'S   COLONOSCOPY  2018   CYSTOSCOPY WITH RETROGRADE PYELOGRAM, URETEROSCOPY AND STENT PLACEMENT N/A 02/13/2013   Procedure: CYSTOSCOPY WITH RETROGRADE PYELOGRAM, URETEROSCOPY AND LITHOTRIPSY ;  Surgeon: Claybon Jabs, MD;  Location: [facility];  Service: Urology;  Laterality: N/A;   CYSTOSCOPY WITH URETEROSCOPY AND STENT PLACEMENT Left 04/17/2019   Procedure: CYSTOSCOPY WITH LEFT  RETROGRADE PYELOGRAM, LEFT URETEROSCOPY, HOLMIUM LASER AND LEFT STENT PLACEMENT;  Surgeon: Kathie Rhodes, MD;  Location: [facility];  Service: Urology;  Laterality: Left;   CYSTOSCOPY/URETEROSCOPY/HOLMIUM LASER/STENT PLACEMENT Right 11/22/2017   Procedure: CYSTOSCOPY/URETEROSCOPY/RETROGRADE PYELOGRAM/HOLMIUM LASER/STENT PLACEMENT;  Surgeon: Kathie Rhodes, MD;  Location: [facility];  Service: Urology;  Laterality: Right;   EXTRACORPOREAL SHOCK WAVE LITHOTRIPSY Right 05-16-2012   FINGER ARTHROSCOPY WITH CARPOMETACARPEL [facility]) ARTHROPLASTY     HOLMIUM LASER APPLICATION Right [id number]   Procedure: HOLMIUM LASER APPLICATION;  Surgeon: Claybon Jabs, MD;  Location: [facility];  Service: Urology;  Laterality: Right;   IR URETERAL STENT LEFT NEW ACCESS W/O SEP NEPHROSTOMY CATH  02/13/2019   KNEE ARTHROSCOPY Bilateral    LUMBAR LAMINECTOMY  X2  1960'S   LUMBAR PERCUTANEOUS PEDICLE SCREW 4 LEVEL N/A 05/06/2018   Procedure: LUMBAR PERCUTANEOUS PEDICLE SCREW LUMBAR ONE TO LUMBAR FIVE WITH MAZOR ;POSTERIOR LATERAL LAMINECTOMY LUMBAR FOUR-FIVE,FIVE-SACRAL ONE;  Surgeon: Kristeen Miss,  MD;  Location: [place];  Service: Neurosurgery;  Laterality: N/A;   NASAL SEPTUM SURGERY  1970'S   NEPHROLITHOTOMY Left 02/20/2019   Procedure: NEPHROLITHOTOMY PERCUTANEOUS;  Surgeon: Kathie Rhodes, MD;  Location: [facility];  Service: Urology;  Laterality: Left;   SHOULDER OPEN ROTATOR CUFF REPAIR Bilateral LEFT 01-11-2001/   RIGHT 07-21-2002   TOTAL KNEE ARTHROPLASTY  04/13/2012   Procedure: TOTAL KNEE ARTHROPLASTY;  Surgeon: Gearlean Alf, MD;  Location: [facility];  Service: Orthopedics;  Laterality: Left;   TOTAL KNEE ARTHROPLASTY  08/08/2012   Procedure: TOTAL KNEE ARTHROPLASTY;  Surgeon: Gearlean Alf, MD;  Location: [facility];  Service: Orthopedics;  Laterality: Right;   TRANSTHORACIC ECHOCARDIOGRAM  03-29-2011   MODERATE LVH/ EF 60-65%/ [facility]ILD MR   URETEROSCOPY WITH HOLMIUM LASER LITHOTRIPSY Left 06/19/2019   Procedure: URETEROSCOPY WITH HOLMIUM LASER LITHOTRIPSY/INSERTION OF DOUBLE J STENT;  Surgeon: Kathie Rhodes, MD;  Location: [facility];  Service: Urology;  Laterality: Left;   VAGINAL HYSTERECTOMY  1960'S    Allergies  Allergies  Allergen Reactions   Contrast Media [Iodinated Contrast Media] Hives and Rash    Omnipaque.  Rash, itching   Cipro [Ciprofloxacin Hcl] Nausea And Vomiting    Pt reports she thinks she is allergic and reports its caused her nausea, anxious, and weakness. Pt unsure.   Doxycycline Nausea And Vomiting and Other (See Comments)   Gabapentin Other (See Comments)    Too drowsy   Metformin Hcl Diarrhea and Other (See Comments)   Pregabalin Other (See Comments)    Too drowsy   Semaglutide(0.25 Or 0.50m-Dos) Other (See Comments)    Stomach upset   Ancef [Cefazolin] Rash    Rash to ancef vs omniopaue contrast   Augmentin [Amoxicillin-Pot Clavulanate] Itching, Nausea And Vomiting and Rash    Did it involve swelling of the face/tongue/throat, SOB, or low BP? Unknown Did it involve sudden or severe rash/hives, skin peeling, or any reaction on the inside of your mouth or  nose? Yes Did you need to seek medical attention at a hospital or doctor's office? No When did it last happen? 2020      If all above answers are "NO", may proceed with cephalosporin use.    Sulfa Antibiotics Itching, Nausea And Vomiting, Rash and Other (See Comments)   Tape Swelling    Adhesive Tape     Labs/Other Studies Reviewed    The following studies were reviewed today: Cardiac monitor 03/2021: Normal sinus rhythm Frequent salvos of SVT with fastest 14 beats at 171 bpm SVT [place] b[place] lasting 1 minute 45 seconds. R-R variability prevents exclusion of AF No symptoms reported.   High burden of SVT salvos (brief), self terminating and unable to exclude AF on a few. No sustained AF noted.  Echo 10/2016: Study Conclusions   - Left ventricle: The cavity size was normal. There was mild    concentric hypertrophy. Systolic function was normal. The    estimated ejection fraction was in the range of 55% to 60%. Wall    motion was normal; there were no regional wall motion    abnormalities. Doppler parameters are consistent with abnormal    left ventricular relaxation (grade 1  diastolic dysfunction).  - Mitral valve: Valve area by pressure half-time: 1.76 cm^2.  - Left atrium: The atrium was mildly to moderately dilated.   LHC 01/2015: Normal coronary arteries Normal left ventricular systolic function False positive myocardial perfusion study     RECOMMENDATIONS: No further ischemic evaluation is necessary Recent Labs: 09/27/2021: Magnesium 1.9 05/01/2022: ALT 12; Brain Natriuretic Peptide 63; BUN 25; Creat 0.93; Hemoglobin 12.1; Platelets 363; Potassium 4.1; Sodium 140  Recent Lipid Panel    Component Value Date/Time   CHOL 226 (H) 05/01/2022 1447   TRIG 167 (H) 05/01/2022 1447   HDL 85 05/01/2022 1447   CHOLHDL 2.7 05/01/2022 1447   VLDL 14 09/25/2021 0549   LDLCALC 113 (H) 05/01/2022 1447    History of Present Illness    77 year old female with the  above past medical history including paroxysmal atrial fibrillation, first-degree AV block, chronic diastolic heart failure, hyperlipidemia, DDD, and type 2 diabetes.  Cardiac catheterization in 2016 in the setting of false positive myocardial perfusion study showed normal coronary arteries.  Echocardiogram in 10/2016 showed EF 55 to 60%, mild concentric LVH, no RWMA, G1 DD.  Cardiac monitor in 03/2021 revealed high burden of SVT, self-terminating, possible atrial fibrillation, no sustained A-fib.  She has been stable on diltiazem, not on anticoagulation.  She was last seen in the office on 03/30/2022 and was stable overall from a cardiac standpoint.  It was noted that should she have recurrent atrial fibrillation she would require chronic anticoagulation therapy.  She contacted our office on 08/26/2022 and noted increased dyspnea on exertion, orthopnea, lower extremity edema.  She was advised to take Lasix 20 mg daily and follow-up in clinic.  She presents today for follow-up accompanied by her husband.  Of note she is a previous patient of Dr. Tamala Julian, and will be following with Dr. Martinique from now on. Since her last visit she notes an approximately a 1 month history of increased dyspnea on exertion, bilateral lower extremity edema (left greater than right), dry cough, orthopnea, and occasional chest tightness.  She notes little improvement in her symptoms with increased Lasix dosing.  Home Medications    Current Outpatient Medications  Medication Sig Dispense Refill   aspirin EC 81 MG tablet Take 81 mg by mouth daily. Swallow whole.     diclofenac sodium (VOLTAREN) 1 % GEL Apply 2 g topically 3 (three) times daily as needed (back pain).      diltiazem (CARDIZEM CD) 120 MG 24 hr capsule TAKE 1 CAPSULE(120 MG) BY MOUTH DAILY 90 capsule 0   furosemide (LASIX) 20 MG tablet Take 1 tablet (20 mg total) by mouth daily as needed for fluid or edema. 30 tablet 1   gabapentin (NEURONTIN) 100 MG capsule Take 1  capsule (100 mg total) by mouth 3 (three) times daily as needed (leg pain, tingling). 90 capsule 3   montelukast (SINGULAIR) 10 MG tablet Take 1 tablet (10 mg total) by mouth at bedtime. 90 tablet 1   Vitamin D, Ergocalciferol, (DRISDOL) 1.25 MG (50000 UNIT) CAPS capsule Take 1 capsule (50,000 Units total) by mouth every 7 (seven) days. 12 capsule 0   No current facility-administered medications for this visit.     Review of Systems    She denies palpitations, pnd, n, v, dizziness, syncope, or early satiety. All other systems reviewed and are otherwise negative except as noted above.   Physical Exam    VS:  BP 130/70   Pulse 82   Ht 5' 2"$  (1.575  m)   Wt 267 lb 9.6 oz (121.4 kg)   SpO2 98%   BMI 48.94 kg/m   GEN: Well nourished, well developed, in no acute distress. HEENT: normal. Neck: Supple, no JVD, carotid bruits, or masses. Cardiac: RRR, no murmurs, rubs, or gallops. No clubbing, cyanosis, nonpitting bilateral lower extremity edema.  Radials/DP/PT 2+ and equal bilaterally.  Respiratory:  Respirations regular and unlabored, clear to auscultation bilaterally. GI: Obese, soft, nontender, nondistended, BS + x 4. MS: no deformity or atrophy. Skin: warm and dry, no rash. Neuro:  Strength and sensation are intact. Psych: Normal affect.  Accessory Clinical Findings    ECG personally reviewed by me today -sinus rhythm, 82 bpm, sinus arrhythmia, first-degree AV block- no acute changes.   Lab Results  Component Value Date   WBC 8.9 05/01/2022   HGB 12.1 05/01/2022   HCT 35.6 05/01/2022   MCV 87.0 05/01/2022   PLT 363 05/01/2022   Lab Results  Component Value Date   CREATININE 0.93 05/01/2022   BUN 25 05/01/2022   NA 140 05/01/2022   K 4.1 05/01/2022   CL 103 05/01/2022   CO2 27 05/01/2022   Lab Results  Component Value Date   ALT 12 05/01/2022   AST 11 05/01/2022   ALKPHOS 67 09/27/2021   BILITOT 0.6 05/01/2022   Lab Results  Component Value Date   CHOL 226 (H)  05/01/2022   HDL 85 05/01/2022   LDLCALC 113 (H) 05/01/2022   TRIG 167 (H) 05/01/2022   CHOLHDL 2.7 05/01/2022    Lab Results  Component Value Date   HGBA1C 6.6 (H) 05/01/2022    Assessment & Plan    1. Chronic diastolic heart failure/dyspnea on exertion: She notes an approximately 1 month history of increased dyspnea on exertion, bilateral lower extremity edema, dry cough, orthopnea, and occasional chest tightness.  She had little improvement in her symptoms with transition from as needed Lasix to Lasix 20 mg daily.  Will check BNP, BMET today.  Will repeat echocardiogram.  Will increase Lasix to 60 mg daily x 3 days followed by 40 mg daily.  Will add potassium 10 mill equivalents daily.  No SGLT2 inhibitor in the setting of frequent UTI. Of note, she does have a history of a false positive stress test, cath in 2016 showed no CAD.  She has a significant allergy to IV contrast.  If symptoms do not improve with diuresis, consider ischemic evaluation pending echo results.  Discussed ongoing monitoring with daily weights, sodium and fluid recommendations.  Plan for close follow-up.  2. Paroxysmal atrial fibrillation: Maintaining sinus rhythm.  Denies any recent palpitations.  She is not on anticoagulation given no recurrence of atrial fibrillation, though it was noted should she have recurrent atrial fibrillation she would likely require anticoagulation therapy at that time.  Continue diltiazem.   3. Hyperlipidemia: LDL was 113 in 04/2022.  She is not on statin therapy at this time.  Encouraged ongoing lifestyle modifications with diet and exercise.  4. Type 2 diabetes/obesity: A1c was 6.6 in 04/2022.  Diet controlled.  Has had difficulty losing weight.  She was previously started on Ozempic per PCP what was afraid to take this medication.  We had a long conversation about the indications for GLP-1 agonists.  I advised her to revisit this with her PCP.  5. Disposition: Follow-up in 2 weeks with  [facility].     Lenna Sciara, NP 08/28/2022, 2:51 PM

## 2022-08-29 LAB — BASIC METABOLIC PANEL
BUN/Creatinine Ratio: 20 (ref 12–28)
BUN: 22 mg/dL (ref 8–27)
CO2: 22 mmol/L (ref 20–29)
Calcium: 9.4 mg/dL (ref 8.7–10.3)
Chloride: 102 mmol/L (ref 96–106)
Creatinine, Ser: 1.11 mg/dL — ABNORMAL HIGH (ref 0.57–1.00)
Glucose: 218 mg/dL — ABNORMAL HIGH (ref 70–99)
Potassium: 4.2 mmol/L (ref 3.5–5.2)
Sodium: 142 mmol/L (ref 134–144)
eGFR: 51 mL/min/{1.73_m2} — ABNORMAL LOW (ref >59)

## 2022-08-29 LAB — BRAIN NATRIURETIC PEPTIDE: BNP: 43.5 pg/mL (ref 0.0–100.0)

## 2022-08-30 ENCOUNTER — Encounter: Payer: Self-pay | Admitting: Nurse Practitioner

## 2022-08-31 NOTE — Telephone Encounter (Signed)
Documented on work sheet

## 2022-09-02 ENCOUNTER — Telehealth: Payer: Self-pay

## 2022-09-02 NOTE — Telephone Encounter (Signed)
Left a detailed message with lab results. Pt was advised to continue current medication and follow up as planned.

## 2022-09-09 ENCOUNTER — Ambulatory Visit (HOSPITAL_COMMUNITY): Payer: PPO | Attending: Cardiology

## 2022-09-09 ENCOUNTER — Ambulatory Visit (HOSPITAL_BASED_OUTPATIENT_CLINIC_OR_DEPARTMENT_OTHER): Payer: PPO

## 2022-09-09 DIAGNOSIS — I5032 Chronic diastolic (congestive) heart failure: Secondary | ICD-10-CM | POA: Diagnosis not present

## 2022-09-09 DIAGNOSIS — R0609 Other forms of dyspnea: Secondary | ICD-10-CM

## 2022-09-09 LAB — ECHOCARDIOGRAM COMPLETE
Area-P 1/2: 4.08 cm2
S' Lateral: 2.9 cm

## 2022-09-11 ENCOUNTER — Telehealth: Payer: Self-pay

## 2022-09-11 NOTE — Telephone Encounter (Signed)
Spoke to pt. Pt was notified of her results. Pt will continue her current medication. Pt did cancel her apt on 09/15/2022. Pt will keep follow up with Dr. Martinique 11/2022.

## 2022-09-15 ENCOUNTER — Ambulatory Visit: Payer: PPO | Admitting: Nurse Practitioner

## 2022-09-25 ENCOUNTER — Other Ambulatory Visit: Payer: Self-pay | Admitting: Nurse Practitioner

## 2022-09-30 DIAGNOSIS — H04123 Dry eye syndrome of bilateral lacrimal glands: Secondary | ICD-10-CM | POA: Diagnosis not present

## 2022-10-12 LAB — HM DIABETES EYE EXAM

## 2022-10-19 ENCOUNTER — Other Ambulatory Visit: Payer: Self-pay | Admitting: Nurse Practitioner

## 2022-10-19 DIAGNOSIS — Z1231 Encounter for screening mammogram for malignant neoplasm of breast: Secondary | ICD-10-CM

## 2022-11-02 ENCOUNTER — Encounter: Payer: Self-pay | Admitting: Nurse Practitioner

## 2022-11-02 ENCOUNTER — Ambulatory Visit
Admission: RE | Admit: 2022-11-02 | Discharge: 2022-11-02 | Disposition: A | Discharge status: Home / Self Care | Payer: PPO | Source: Ambulatory Visit | Attending: Nurse Practitioner | Admitting: Nurse Practitioner

## 2022-11-02 ENCOUNTER — Ambulatory Visit (INDEPENDENT_AMBULATORY_CARE_PROVIDER_SITE_OTHER): Payer: PPO | Admitting: Nurse Practitioner

## 2022-11-02 VITALS — BP 126/78 | HR 73 | Temp 97.6°F | Ht 62.0 in | Wt 258.4 lb

## 2022-11-02 DIAGNOSIS — E119 Type 2 diabetes mellitus without complications: Secondary | ICD-10-CM | POA: Diagnosis not present

## 2022-11-02 DIAGNOSIS — M5416 Radiculopathy, lumbar region: Secondary | ICD-10-CM | POA: Diagnosis not present

## 2022-11-02 DIAGNOSIS — Z1231 Encounter for screening mammogram for malignant neoplasm of breast: Secondary | ICD-10-CM | POA: Diagnosis not present

## 2022-11-02 LAB — POCT GLYCOSYLATED HEMOGLOBIN (HGB A1C)
HbA1c POC (<> result, manual entry): 6.4 % (ref 4.0–5.6)
HbA1c, POC (controlled diabetic range): 6.4 % (ref 0.0–7.0)
HbA1c, POC (prediabetic range): 6.4 % (ref 5.7–6.4)
Hemoglobin A1C: 6.4 % — AB (ref 4.0–5.6)

## 2022-11-02 MED ORDER — LIDOCAINE 4 % EX PTCH: 1.0000 | MEDICATED_PATCH | CUTANEOUS | 0 refills | Status: DC

## 2022-11-02 NOTE — Assessment & Plan Note (Signed)
Chronic, not controlled.  She is still having a lot of pain that radiates down to her left leg.  Will have her continue gabapentin and also start a lidocaine patch daily.  Also printed her stretches to do daily and encouraged her to start slowly increasing her walking.  Follow-up in 3 months.

## 2022-11-02 NOTE — Patient Instructions (Signed)
It was great to see you!  Start the back exercises daily.   Start colace daily as needed for constipation. Make sure you are drinking plenty of water.   Stat a lidocaine patch daily on your back. You put it on in the morning and take it off at night.  Let's follow-up in 3 months, sooner if you have concerns.  If a referral was placed today, you will be contacted for an appointment. Please note that routine referrals can sometimes take up to 3-4 weeks to process. Please call our office if you haven't heard anything after this time frame.  Take care,  Rodman Pickle, NP

## 2022-11-02 NOTE — Progress Notes (Signed)
Established Patient Office Visit  Subjective   Patient ID: [name], female    DOB: 06/17/1946  Age: 77 y.o. MRN: [id number]  Chief Complaint  Patient presents with   Diabetes    Follow up and back pain    HPI  [name] is here to follow-up on diabetes and chronic back pain.   She has started the Ozempic and is tolerating it well.  She states that she is having some mild constipation.  She states that she does not drink a lot of water.  She has lost 9 pounds since her last visit.  She denies chest pain and shortness of breath.  She is still having ongoing chronic back pain.  She states that it is on the right side and will sometimes radiate down her leg.  She has been taking the gabapentin, however she is still having the pain.  She states that she does not walk a lot since that makes the pain worse.  She can stand for about 5 minutes at a time before she needs to sit down again.  She does not stretch.  She denies incontinence.    ROS See pertinent positives and negatives per HPI.    Objective:     BP 126/78 (BP Location: Left Arm)   Pulse 73   Temp 97.6 F (36.4 C) (Oral)   Ht  (1.575 m)   Wt 258 lb 6.4 oz (117.2 kg)   SpO2 96%   BMI 47.26 kg/m  BP Readings from Last 3 Encounters:  11/02/22 126/78  08/28/22 130/70  08/17/22 (!) 149/79   Wt Readings from Last 3 Encounters:  11/02/22 258 lb 6.4 oz (117.2 kg)  08/28/22 267 lb 9.6 oz (121.4 kg)  08/17/22 264 lb (119.7 kg)      Physical Exam Vitals and nursing note reviewed.  Constitutional:      General: She is not in acute distress.    Appearance: Normal appearance.  HENT:     Head: Normocephalic.  Eyes:     Conjunctiva/sclera: Conjunctivae normal.  Cardiovascular:     Rate and Rhythm: Normal rate and regular rhythm.     Pulses: Normal pulses.     Heart sounds: Normal heart sounds.  Pulmonary:     Effort: Pulmonary effort is normal.     Breath sounds: Normal breath sounds.   Musculoskeletal:     Cervical back: Normal range of motion.  Skin:    General: Skin is warm.  Neurological:     General: No focal deficit present.     Mental Status: She is alert and oriented to person, place, and time.  Psychiatric:        Mood and Affect: Mood normal.        Behavior: Behavior normal.        Thought Content: Thought content normal.        Judgment: Judgment normal.      Results for orders placed or performed in visit on 11/02/22  POCT glycosylated hemoglobin (Hb A1C)  Result Value Ref Range   Hemoglobin A1C 6.4 (A) 4.0 - 5.6 %   HbA1c POC (<> result, manual entry) 6.4 4.0 - 5.6 %   HbA1c, POC (prediabetic range) 6.4 5.7 - 6.4 %   HbA1c, POC (controlled diabetic range) 6.4 0.0 - 7.0 %  HM DIABETES EYE EXAM  Result Value Ref Range   HM Diabetic Eye Exam No Retinopathy No Retinopathy      The 10-year  ASCVD risk score (Arnett DK, et al., 2019) is: 48.4%    Assessment & Plan:   Problem List Items Addressed This Visit       Endocrine   Type 2 diabetes mellitus without complications - Primary    Chronic, stable.  Her A1c today is 6.4%.  She has lost 9 pounds since starting the Ozempic, congratulated her on this!  Continue Ozempic 0.5 mg injection weekly.  Some constipation, will have her start Colace 100 mg daily as needed and also increase the amount of fluid she is drinking.  She went to the eye doctor 3 weeks ago and said her eye exam was normal.  Follow-up in 3 months.      Relevant Medications   OZEMPIC, 0.25 OR 0.5 MG/DOSE, 2 MG/3ML SOPN   Other Relevant Orders   POCT glycosylated hemoglobin (Hb A1C) (Completed)     Nervous and Auditory   Lumbar radiculopathy    Chronic, not controlled.  She is still having a lot of pain that radiates down to her left leg.  Will have her continue gabapentin and also start a lidocaine patch daily.  Also printed her stretches to do daily and encouraged her to start slowly increasing her walking.  Follow-up in 3  months.        Other   Morbid obesity    She has lost 9 pounds since her last visit, congratulated her on this!  Continue Ozempic 0.5 mg injection weekly.      Relevant Medications   OZEMPIC, 0.25 OR 0.5 MG/DOSE, 2 MG/3ML SOPN    Return in about 3 months (around 02/01/2023) for Diabetes.    Gerre Scull, NP

## 2022-11-02 NOTE — Assessment & Plan Note (Signed)
She has lost 9 pounds since her last visit, congratulated her on this!  Continue Ozempic 0.5 mg injection weekly.

## 2022-11-02 NOTE — Assessment & Plan Note (Addendum)
Chronic, stable.  Her A1c today is 6.4%.  She has lost 9 pounds since starting the Ozempic, congratulated her on this!  Continue Ozempic 0.5 mg injection weekly.  Some constipation, will have her start Colace 100 mg daily as needed and also increase the amount of fluid she is drinking.  She went to the eye doctor 3 weeks ago and said her eye exam was normal.  Follow-up in 3 months.

## 2022-11-06 ENCOUNTER — Other Ambulatory Visit: Payer: Self-pay | Admitting: Nurse Practitioner

## 2022-11-09 ENCOUNTER — Other Ambulatory Visit (HOSPITAL_COMMUNITY): Payer: PPO

## 2022-11-19 DIAGNOSIS — L821 Other seborrheic keratosis: Secondary | ICD-10-CM | POA: Diagnosis not present

## 2022-11-19 DIAGNOSIS — L814 Other melanin hyperpigmentation: Secondary | ICD-10-CM | POA: Diagnosis not present

## 2022-11-19 DIAGNOSIS — L820 Inflamed seborrheic keratosis: Secondary | ICD-10-CM | POA: Diagnosis not present

## 2022-11-19 DIAGNOSIS — R58 Hemorrhage, not elsewhere classified: Secondary | ICD-10-CM | POA: Diagnosis not present

## 2022-11-19 DIAGNOSIS — D225 Melanocytic nevi of trunk: Secondary | ICD-10-CM | POA: Diagnosis not present

## 2022-11-19 DIAGNOSIS — L304 Erythema intertrigo: Secondary | ICD-10-CM | POA: Diagnosis not present

## 2022-12-08 ENCOUNTER — Ambulatory Visit: Payer: PPO | Admitting: Internal Medicine

## 2022-12-11 NOTE — Progress Notes (Unsigned)
Cardiology Office Note:    Date:  12/14/2022   ID:  [name], DOB [birth year], MRN [id number]  PCP:  Gerre Scull, NP   [facility] Providers Cardiologist:  Carren Blakley [place], MD     Referring MD: Gerre Scull, NP   Chief Complaint  Patient presents with   svt    History of Present Illness:    [name] is a 77 y.o. female with a hx of paroxysmal atrial fibrillation, first-degree AV block, chronic diastolic heart failure, hyperlipidemia, DDD, and type 2 diabetes who presents for follow-up related to atrial fibrillation. She was formerly a patient of Dr Verdis Prime. Cardiac catheterization in 2016 in the setting of false positive myocardial perfusion study showed normal coronary arteries.  Echocardiogram in 10/2016 showed EF 55 to 60%, mild concentric LVH, no RWMA, G1 DD.  Cardiac monitor in 03/2021 revealed high burden of SVT, self-terminating, possible atrial fibrillation, no sustained A-fib.  She has been stable on diltiazem, not on anticoagulation.  She was last seen in the office on 03/30/2022 and was stable overall from a cardiac standpoint.  It was noted that should she have recurrent atrial fibrillation she would require chronic anticoagulation therapy.   She was seen in Feb 2024 with increased dyspnea and leg swelling. She was placed on lasix 20 mg daily. BNP was normal. Echo was normal. She was maintaining NSR.   On follow up she is doing well. Only took lasix for a few days and reports swelling went away. No dyspnea or chest pain. Not aware of SVT. No bleeding.    Past Medical History:  Diagnosis Date   Acute hypoxemic respiratory failure (HCC) 10/24/2016   Arthritis    both knees   Atrial fibrillation, chronic (HCC)    Carpal tunnel syndrome, bilateral    both hands go to sleep at times from elbows down   Chronic diastolic heart failure (HCC)    DDD (degenerative disc disease), lumbosacral    Diabetes mellitus without complication (HCC)     Dyspnea    during chronic bronchitis, resolved at this time   First degree AV block    Hematuria 11/15/2017   History of kidney stones    History of rib fracture 03/2011   Lumbar stenosis    Obesity    Osteoporosis    Postlaminectomy syndrome    Pre-diabetes    Renal calculi    BILATERAL   Right ureteral stone    Rotator cuff tear, left    Rotator cuff tear, right    UTI (urinary tract infection)     Past Surgical History:  Procedure Laterality Date   ANTERIOR CERVICAL DECOMP/DISCECTOMY FUSION  03-18-2011   C3 -- C7   ANTERIOR LATERAL LUMBAR FUSION 4 LEVELS N/A 05/06/2018   Procedure: Lumbar one- two Lumbar two -three Lumbar  three-four Lumbar four-five Anterolateral decompression/fusion/posterior bilateral laminectomy Lumbar four-five Lumbar five Sacral one/posterior percutaneous fixation/mazor;  Surgeon: Barnett Abu, MD;  Location: [facility];  Service: Neurosurgery;  Laterality: N/A;   APPENDECTOMY     APPLICATION OF ROBOTIC ASSISTANCE FOR SPINAL PROCEDURE N/A 05/06/2018   Procedure: APPLICATION OF ROBOTIC ASSISTANCE FOR SPINAL PROCEDURE;  Surgeon: Barnett Abu, MD;  Location: [facility];  Service: Neurosurgery;  Laterality: N/A;   BILATERAL CARPAL TUNNEL RELEASE     3 screws in right hand, thumb, middle finger, and index finger,    CARDIAC CATHETERIZATION N/A 01/16/2015   Procedure: Left Heart Cath and Coronary Angiography;  Surgeon: Barry Dienes  Katrinka Blazing, MD;  Location: [facility];  Service: Cardiovascular;  Laterality: N/A;   CHOLECYSTECTOMY OPEN  1970'S   COLONOSCOPY  2018   CYSTOSCOPY WITH RETROGRADE PYELOGRAM, URETEROSCOPY AND STENT PLACEMENT N/A 02/13/2013   Procedure: CYSTOSCOPY WITH RETROGRADE PYELOGRAM, URETEROSCOPY AND LITHOTRIPSY ;  Surgeon: Garnett Farm, MD;  Location: [facility];  Service: Urology;  Laterality: N/A;   CYSTOSCOPY WITH URETEROSCOPY AND STENT PLACEMENT Left 04/17/2019   Procedure: CYSTOSCOPY WITH LEFT  RETROGRADE PYELOGRAM, LEFT URETEROSCOPY,  HOLMIUM LASER AND LEFT STENT PLACEMENT;  Surgeon: Ihor Gully, MD;  Location: [facility];  Service: Urology;  Laterality: Left;   CYSTOSCOPY/URETEROSCOPY/HOLMIUM LASER/STENT PLACEMENT Right 11/22/2017   Procedure: CYSTOSCOPY/URETEROSCOPY/RETROGRADE PYELOGRAM/HOLMIUM LASER/STENT PLACEMENT;  Surgeon: Ihor Gully, MD;  Location: [facility];  Service: Urology;  Laterality: Right;   EXTRACORPOREAL SHOCK WAVE LITHOTRIPSY Right 05-16-2012   FINGER ARTHROSCOPY WITH CARPOMETACARPEL [facility]) ARTHROPLASTY     HOLMIUM LASER APPLICATION Right 02/13/2013   Procedure: HOLMIUM LASER APPLICATION;  Surgeon: Garnett Farm, MD;  Location: [facility];  Service: Urology;  Laterality: Right;   IR URETERAL STENT LEFT NEW ACCESS W/O SEP NEPHROSTOMY CATH  02/13/2019   KNEE ARTHROSCOPY Bilateral    LUMBAR LAMINECTOMY  X2  1960'S   LUMBAR PERCUTANEOUS PEDICLE SCREW 4 LEVEL N/A 05/06/2018   Procedure: LUMBAR PERCUTANEOUS PEDICLE SCREW LUMBAR ONE TO LUMBAR FIVE WITH MAZOR ;POSTERIOR LATERAL LAMINECTOMY LUMBAR FOUR-FIVE,FIVE-SACRAL ONE;  Surgeon: Barnett Abu, MD;  Location: [facility];  Service: Neurosurgery;  Laterality: N/A;   NASAL SEPTUM SURGERY  1970'S   NEPHROLITHOTOMY Left 02/20/2019   Procedure: NEPHROLITHOTOMY PERCUTANEOUS;  Surgeon: Ihor Gully, MD;  Location: [facility];  Service: Urology;  Laterality: Left;   SHOULDER OPEN ROTATOR CUFF REPAIR Bilateral LEFT 01-11-2001/   RIGHT 07-21-2002   TOTAL KNEE ARTHROPLASTY  04/13/2012   Procedure: TOTAL KNEE ARTHROPLASTY;  Surgeon: Loanne Drilling, MD;  Location: [facility];  Service: Orthopedics;  Laterality: Left;   TOTAL KNEE ARTHROPLASTY  08/08/2012   Procedure: TOTAL KNEE ARTHROPLASTY;  Surgeon: Loanne Drilling, MD;  Location: [facility];  Service: Orthopedics;  Laterality: Right;   TRANSTHORACIC ECHOCARDIOGRAM  03-29-2011   MODERATE LVH/ EF 60-65%/ MILD MR   URETEROSCOPY WITH HOLMIUM LASER LITHOTRIPSY Left 06/19/2019   Procedure: URETEROSCOPY WITH HOLMIUM  LASER LITHOTRIPSY/INSERTION OF DOUBLE J STENT;  Surgeon: Ihor Gully, MD;  Location: [facility];  Service: Urology;  Laterality: Left;   VAGINAL HYSTERECTOMY  1960'S    Current Medications: Current Meds  Medication Sig   aspirin EC 81 MG tablet Take 81 mg by mouth daily. Swallow whole.   diclofenac sodium (VOLTAREN) 1 % GEL Apply 2 g topically 3 (three) times daily as needed (back pain).    diltiazem (CARDIZEM CD) 120 MG 24 hr capsule TAKE 1 CAPSULE(120 MG) BY MOUTH DAILY   gabapentin (NEURONTIN) 100 MG capsule Take 1 capsule (100 mg total) by mouth 3 (three) times daily as needed (leg pain, tingling).   lidocaine 4 % Place 1 patch onto the skin daily.   montelukast (SINGULAIR) 10 MG tablet Take 1 tablet (10 mg total) by mouth at bedtime.   OZEMPIC, 0.25 OR 0.5 MG/DOSE, 2 MG/3ML SOPN Inject 0.5 mg into the skin once a week.     Allergies:   Contrast media [iodinated contrast media], Cipro [ciprofloxacin hcl], Doxycycline, Gabapentin, Metformin hcl, Pregabalin, Semaglutide(0.25 or 0.5mg -dos), Ancef [cefazolin], Augmentin [amoxicillin-pot clavulanate], Sulfa antibiotics, and Tape   Social History   Socioeconomic History  Marital status: Married    Spouse name: Not on file   Number of children: Not on file   Years of education: Not on file   Highest education level: Not on file  Occupational History   Not on file  Tobacco Use   Smoking status: Never   Smokeless tobacco: Never  Vaping Use   Vaping Use: Never used  Substance and Sexual Activity   Alcohol use: No   Drug use: No   Sexual activity: Yes    Partners: Male    Birth control/protection: Surgical  Other Topics Concern   Not on file  Social History Narrative   Not on file   Social Determinants of Health   Financial Resource Strain: Not on file  Food Insecurity: Not on file  Transportation Needs: Not on file  Physical Activity: Not on file  Stress: Not on file  Social Connections: Not on file     Family  History: The patient's family history includes Diabetes in her brother, brother, and sister; Diabetes type II in her mother; Heart disease in her brother and brother; Hypertension in her brother, brother, and sister; Lung cancer in her father and mother.  ROS:   Please see the history of present illness.     All other systems reviewed and are negative.  EKGs/Labs/Other Studies Reviewed:    The following studies were reviewed today: Cardiac monitor 03/2021: Normal sinus rhythm Frequent salvos of SVT with fastest 14 beats at 171 bpm SVT longest at [facility] HR 114 bpm lasting 1 minute 45 seconds. R-R variability prevents exclusion of AF No symptoms reported.   High burden of SVT salvos (brief), self terminating and unable to exclude AF on a few. No sustained AF noted.   Echo 10/2016: Study Conclusions   - Left ventricle: The cavity size was normal. There was mild    concentric hypertrophy. Systolic function was normal. The    estimated ejection fraction was in the range of 55% to 60%. Wall    motion was normal; there were no regional wall motion    abnormalities. Doppler parameters are consistent with abnormal    left ventricular relaxation (grade 1 diastolic dysfunction).  - Mitral valve: Valve area by pressure half-time: 1.76 cm^2.  - Left atrium: The atrium was mildly to moderately dilated.   LHC 01/2015: Normal coronary arteries Normal left ventricular systolic function False positive myocardial perfusion study     RECOMMENDATIONS: No further ischemic evaluation is necessary   Echo 09/09/22: IMPRESSIONS     1. Left ventricular ejection fraction, by estimation, is 60 to 65%. The  left ventricle has normal function. The left ventricle has no regional  wall motion abnormalities. Left ventricular diastolic parameters were  normal.   2. Right ventricular systolic function is normal. The right ventricular  size is normal.   3. The mitral valve is normal in structure. Mild mitral  valve  regurgitation. No evidence of mitral stenosis.   4. The aortic valve is normal in structure. Aortic valve regurgitation is  not visualized. No aortic stenosis is present.   5. The inferior vena cava is normal in size with greater than 50%  respiratory variability, suggesting right atrial pressure of 3 mmHg.    EKG:  EKG is not ordered today.  The ekg ordered today demonstrates N/A  Recent Labs: 05/01/2022: ALT 12; Hemoglobin 12.1; Platelets 363 08/28/2022: BNP 43.5; BUN 22; Creatinine, Ser 1.11; Potassium 4.2; Sodium 142  Recent Lipid Panel    Component Value  Date/Time   CHOL 226 (H) 05/01/2022 1447   TRIG 167 (H) 05/01/2022 1447   HDL 85 05/01/2022 1447   CHOLHDL 2.7 05/01/2022 1447   VLDL 14 09/25/2021 0549   LDLCALC 113 (H) 05/01/2022 1447     Risk Assessment/Calculations:                Physical Exam:    VS:  BP 124/74 (BP Location: Right Arm, Patient Position: Sitting)   Pulse 91   Ht 5\' 2"  (1.575 m)   Wt 255 lb (115.7 kg)   SpO2 96%   BMI 46.64 kg/m     Wt Readings from Last 3 Encounters:  12/14/22 255 lb (115.7 kg)  11/02/22 258 lb 6.4 oz (117.2 kg)  08/28/22 267 lb 9.6 oz (121.4 kg)     GEN: Well nourished, obese in no acute distress HEENT: Normal NECK: No JVD; No carotid bruits LYMPHATICS: No lymphadenopathy CARDIAC: RRR, no murmurs, rubs, gallops RESPIRATORY:  Clear to auscultation without rales, wheezing or rhonchi  ABDOMEN: Soft, non-tender, non-distended MUSCULOSKELETAL:  No edema; No deformity  SKIN: Warm and dry NEUROLOGIC:  Alert and oriented x 3 PSYCHIATRIC:  Normal affect   ASSESSMENT:    1. Chronic diastolic heart failure (HCC)   2. Bilateral lower extremity edema   3. Essential hypertension   4. SVT (supraventricular tachycardia)    PLAN:    In order of problems listed above:  Leg swelling is doing well. Normal Echo and BNP. Can use lasix PrN HTN controlled.  SVT well controlled on Diltiazem. No documented Afib             Medication Adjustments/Labs and Tests Ordered: Current medicines are reviewed at length with the patient today.  Concerns regarding medicines are outlined above.  No orders of the defined types were placed in this encounter.  No orders of the defined types were placed in this encounter.   There are no Patient Instructions on file for this visit.   Signed, Casey Maxfield [place], MD  12/14/2022 10:15 AM    [facility]

## 2022-12-14 ENCOUNTER — Ambulatory Visit: Payer: PPO | Attending: Cardiology | Admitting: Cardiology

## 2022-12-14 ENCOUNTER — Encounter: Payer: Self-pay | Admitting: Cardiology

## 2022-12-14 VITALS — BP 124/74 | HR 91 | Ht 62.0 in | Wt 255.0 lb

## 2022-12-14 DIAGNOSIS — I5032 Chronic diastolic (congestive) heart failure: Secondary | ICD-10-CM

## 2022-12-14 DIAGNOSIS — I471 Supraventricular tachycardia, unspecified: Secondary | ICD-10-CM | POA: Diagnosis not present

## 2022-12-14 DIAGNOSIS — R600 Localized edema: Secondary | ICD-10-CM | POA: Diagnosis not present

## 2022-12-14 DIAGNOSIS — I10 Essential (primary) hypertension: Secondary | ICD-10-CM

## 2022-12-14 MED ORDER — DILTIAZEM HCL ER COATED BEADS 120 MG PO CP24: 120.0000 mg | ORAL_CAPSULE | Freq: Every day | ORAL | 3 refills | Status: DC

## 2022-12-14 NOTE — Patient Instructions (Signed)
Medication Instructions:  Continue same medications *If you need a refill on your cardiac medications before your next appointment, please call your pharmacy*   Lab Work: None  ordered  Testing/Procedures: None ordered   Follow-Up: At [facility], you and your health needs are our priority.  As part of our continuing mission to provide you with exceptional heart care, we have created designated Provider Care Teams.  These Care Teams include your primary Cardiologist (physician) and Advanced Practice Providers (APPs -  Physician Assistants and Nurse Practitioners) who all work together to provide you with the care you need, when you need it.  We recommend signing up for the patient portal called "MyChart".  Sign up information is provided on this After Visit Summary.  MyChart is used to connect with patients for Virtual Visits (Telemedicine).  Patients are able to view lab/test results, encounter notes, upcoming appointments, etc.  Non-urgent messages can be sent to your provider as well.   To learn more about what you can do with MyChart, go to [email or web address].    Your next appointment:  1 year    Call in March to schedule June appointment     Provider:  Dr.Jordan   

## 2022-12-25 ENCOUNTER — Encounter: Payer: Self-pay | Admitting: Nurse Practitioner

## 2022-12-25 ENCOUNTER — Ambulatory Visit (INDEPENDENT_AMBULATORY_CARE_PROVIDER_SITE_OTHER): Payer: PPO | Admitting: Nurse Practitioner

## 2022-12-25 VITALS — BP 138/90 | HR 80 | Temp 98.0°F | Ht 62.0 in | Wt 255.0 lb

## 2022-12-25 DIAGNOSIS — J029 Acute pharyngitis, unspecified: Secondary | ICD-10-CM | POA: Diagnosis not present

## 2022-12-25 LAB — POCT RAPID STREP A (OFFICE): Rapid Strep A Screen: NEGATIVE

## 2022-12-25 MED ORDER — AZITHROMYCIN 250 MG PO TABS: ORAL_TABLET | ORAL | 0 refills | Status: AC

## 2022-12-25 NOTE — Patient Instructions (Signed)
It was great to see you!  Start zpak 2 tablets today, then 1 tablet daily.   Keep doing the throat spray.  You can gargle with warm salt water.   Ibuprofen/tylenol can help as well  Let's follow-up if your symptoms worsen or don't improve.   Take care,  Rodman Pickle, NP

## 2022-12-25 NOTE — Progress Notes (Signed)
Acute Office Visit  Subjective:     Patient ID: SHAW ALICE, female    DOB: 07/13/1946, 77 y.o.   MRN: [id number]  Chief Complaint  Patient presents with   Sore Throat    Since Saturday and coughing that started last night, blood in mucus when she blows her nose   HPI:  Patient is in today for sore throat for the last 6 days.  UPPER RESPIRATORY TRACT INFECTION  Fever: no Cough: yes - slight Shortness of breath: no Wheezing: no Chest pain: no Chest tightness: no Chest congestion: no Nasal congestion: no Runny nose: no Post nasal drip: yes - better Sneezing: no Sore throat: yes Swollen glands: yes Sinus pressure: yes Headache:  slight Face pain: no Toothache: no Ear pain: no bilateral Ear pressure: yes bilateral Eyes red/itching:yes Eye drainage/crusting: no  Vomiting: no Rash: no Fatigue: yes Sick contacts: no Strep contacts: no  Context: better Recurrent sinusitis: no Relief with OTC cold/cough medications: yes  Treatments attempted: vicks, chloraseptic, cough syrup with codeine, tylenol   ROS See pertinent positives and negatives per HPI.     Objective:    BP (!) 138/90 (BP Location: Left Arm)   Pulse 80   Temp 98 F (36.7 C)   Ht 5\' 2"  (1.575 m)   Wt 255 lb (115.7 kg)   SpO2 97%   BMI 46.64 kg/m  BP Readings from Last 3 Encounters:  12/25/22 (!) 138/90  12/14/22 124/74  11/02/22 126/78   Wt Readings from Last 3 Encounters:  12/25/22 255 lb (115.7 kg)  12/14/22 255 lb (115.7 kg)  11/02/22 258 lb 6.4 oz (117.2 kg)      Physical Exam Vitals and nursing note reviewed.  Constitutional:      General: She is not in acute distress.    Appearance: Normal appearance.  HENT:     Head: Normocephalic.     Right Ear: Tympanic membrane, ear canal and external ear normal.     Left Ear: Tympanic membrane, ear canal and external ear normal.     Mouth/Throat:     Pharynx: Posterior oropharyngeal erythema present. No oropharyngeal exudate.   Eyes:     Conjunctiva/sclera: Conjunctivae normal.  Cardiovascular:     Rate and Rhythm: Normal rate and regular rhythm.     Pulses: Normal pulses.     Heart sounds: Normal heart sounds.  Pulmonary:     Effort: Pulmonary effort is normal.     Breath sounds: Normal breath sounds.  Musculoskeletal:     Cervical back: Normal range of motion.  Skin:    General: Skin is warm.  Neurological:     General: No focal deficit present.     Mental Status: She is alert and oriented to person, place, and time.  Psychiatric:        Mood and Affect: Mood normal.        Behavior: Behavior normal.        Thought Content: Thought content normal.        Judgment: Judgment normal.     Results for orders placed or performed in visit on 12/25/22  POCT rapid strep A  Result Value Ref Range   Rapid Strep A Screen Negative Negative        Assessment & Plan:   Problem List Items Addressed This Visit   None Visit Diagnoses     Sore throat    -  Primary   Strep negative, however concern for strep. Will treat with  zpak. Continue tylenol/ibuprofen prn. Gargle with warm salt water. F/U if any concerns.   Relevant Orders   POCT rapid strep A (Completed)       Meds ordered this encounter  Medications   azithromycin (ZITHROMAX) 250 MG tablet    Sig: Take 2 tablets on day 1, then 1 tablet daily on days 2 through 5    Dispense:  6 tablet    Refill:  0    Return if symptoms worsen or fail to improve.  Gerre Scull, NP

## 2022-12-29 DIAGNOSIS — C4442 Squamous cell carcinoma of skin of scalp and neck: Secondary | ICD-10-CM | POA: Diagnosis not present

## 2022-12-29 DIAGNOSIS — D492 Neoplasm of unspecified behavior of bone, soft tissue, and skin: Secondary | ICD-10-CM | POA: Diagnosis not present

## 2023-01-08 ENCOUNTER — Other Ambulatory Visit: Payer: Self-pay | Admitting: Nurse Practitioner

## 2023-01-08 NOTE — Telephone Encounter (Signed)
Requesting: MONTELUKAST 10MG  TABLETS  Last Visit: 12/25/2022 Next Visit: 02/01/2023 Last Refill: 05/01/2022  Please Advise

## 2023-02-01 ENCOUNTER — Encounter: Payer: Self-pay | Admitting: Nurse Practitioner

## 2023-02-01 ENCOUNTER — Ambulatory Visit (INDEPENDENT_AMBULATORY_CARE_PROVIDER_SITE_OTHER): Payer: PPO | Admitting: Nurse Practitioner

## 2023-02-01 VITALS — BP 130/84 | HR 78 | Temp 97.1°F | Ht 62.0 in | Wt 251.0 lb

## 2023-02-01 DIAGNOSIS — I471 Supraventricular tachycardia, unspecified: Secondary | ICD-10-CM | POA: Diagnosis not present

## 2023-02-01 DIAGNOSIS — J3089 Other allergic rhinitis: Secondary | ICD-10-CM | POA: Diagnosis not present

## 2023-02-01 DIAGNOSIS — E119 Type 2 diabetes mellitus without complications: Secondary | ICD-10-CM | POA: Diagnosis not present

## 2023-02-01 DIAGNOSIS — Z7985 Long-term (current) use of injectable non-insulin antidiabetic drugs: Secondary | ICD-10-CM

## 2023-02-01 LAB — LIPID PANEL
Cholesterol: 192 mg/dL (ref 0–200)
HDL: 72.3 mg/dL (ref >39.00)
LDL Cholesterol: 98 mg/dL (ref 0–99)
NonHDL: 119.73
Total CHOL/HDL Ratio: 3
Triglycerides: 110 mg/dL (ref 0.0–149.0)
VLDL: 22 mg/dL (ref 0.0–40.0)

## 2023-02-01 LAB — COMPREHENSIVE METABOLIC PANEL
ALT: 11 U/L (ref 0–35)
AST: 12 U/L (ref 0–37)
Albumin: 4.1 g/dL (ref 3.5–5.2)
Alkaline Phosphatase: 100 U/L (ref 39–117)
BUN: 18 mg/dL (ref 6–23)
CO2: 27 mEq/L (ref 19–32)
Calcium: 9.4 mg/dL (ref 8.4–10.5)
Chloride: 103 mEq/L (ref 96–112)
Creatinine, Ser: 0.93 mg/dL (ref 0.40–1.20)
GFR: 58.94 mL/min — ABNORMAL LOW (ref >60.00)
Glucose, Bld: 105 mg/dL — ABNORMAL HIGH (ref 70–99)
Potassium: 4.6 mEq/L (ref 3.5–5.1)
Sodium: 140 mEq/L (ref 135–145)
Total Bilirubin: 0.7 mg/dL (ref 0.2–1.2)
Total Protein: 7 g/dL (ref 6.0–8.3)

## 2023-02-01 LAB — CBC WITH DIFFERENTIAL/PLATELET
Basophils Absolute: 0.1 10*3/uL (ref 0.0–0.1)
Basophils Relative: 1.2 % (ref 0.0–3.0)
Eosinophils Absolute: 0.3 10*3/uL (ref 0.0–0.7)
Eosinophils Relative: 3 % (ref 0.0–5.0)
HCT: 38.7 % (ref 36.0–46.0)
Hemoglobin: 12.6 g/dL (ref 12.0–15.0)
Lymphocytes Relative: 22.2 % (ref 12.0–46.0)
Lymphs Abs: 2.2 10*3/uL (ref 0.7–4.0)
MCHC: 32.4 g/dL (ref 30.0–36.0)
MCV: 88.4 fl (ref 78.0–100.0)
Monocytes Absolute: 0.6 10*3/uL (ref 0.1–1.0)
Monocytes Relative: 5.6 % (ref 3.0–12.0)
Neutro Abs: 6.8 10*3/uL (ref 1.4–7.7)
Neutrophils Relative %: 68 % (ref 43.0–77.0)
Platelets: 388 10*3/uL (ref 150.0–400.0)
RBC: 4.38 Mil/uL (ref 3.87–5.11)
RDW: 13.7 % (ref 11.5–15.5)
WBC: 9.9 10*3/uL (ref 4.0–10.5)

## 2023-02-01 LAB — HEMOGLOBIN A1C: Hgb A1c MFr Bld: 6.4 % (ref 4.6–6.5)

## 2023-02-01 NOTE — Assessment & Plan Note (Signed)
She saw a new cardiologist since her other one retired and was told that she never had A-fib, she has runs of SVT. Will have her continue diltiazem 120mg  daily. Continue collaboration and recommendations from cardiology.

## 2023-02-01 NOTE — Assessment & Plan Note (Signed)
She has lost another 4 pounds since her last visit, congratulated her on this!  Continue Ozempic 0.25 mg injection weekly.

## 2023-02-01 NOTE — Patient Instructions (Addendum)
It was great to see you!  We are checking your labs today and will let you know the results via mychart/phone.   Keep up the great work  We will check your cholesterol and see how it is doing.   Stop your singulair and see how your allergies or congestion do.   Start claritin or zyrtec daily to help with your ears  Let's follow-up in 6 months, sooner if you have concerns.  If a referral was placed today, you will be contacted for an appointment. Please note that routine referrals can sometimes take up to 3-4 weeks to process. Please call our office if you haven't heard anything after this time frame.  Take care,  Rodman Pickle, NP

## 2023-02-01 NOTE — Assessment & Plan Note (Addendum)
Chronic, stable.  She has lost another 4 pounds since starting the Ozempic, congratulated her on this!  Continue Ozempic 0.25 mg injection weekly.  Discussed drinking plenty of water. Up to date on eye exam, pneumonia vaccine, and foot exam.  Discussed starting a statin, however she would like to hold off until we get her lipid panel back. Check CMP, CBC, A1c, and lipid panel today. Follow-up in 3 months.

## 2023-02-01 NOTE — Assessment & Plan Note (Signed)
She states her symptoms have been controlled. Will stop her montelukast and she can take zyrtec or claritin 10mg  daily as needed.

## 2023-02-01 NOTE — Progress Notes (Signed)
Established Patient Office Visit  Subjective   Patient ID: [name], female    DOB: August 30, 1945  Age: 77 y.o. MRN: [id number]  Chief Complaint  Patient presents with   Diabetes    Follow up, no concerns    HPI  [name] is here to follow-up on diabetes and hyperlipidemia.   DIABETES  Hypoglycemic episodes:no Polydipsia/polyuria: no Visual disturbance: no Chest pain: no Paresthesias: no Glucose Monitoring: no Taking Insulin?: no Blood Pressure Monitoring: not checking Retinal Examination: Up to Date Foot Exam: Up to Date Diabetic Education: Completed Pneumovax: Up to Date Influenza: Up to Date Aspirin: yes  ROS See pertinent positives and negatives per HPI.    Objective:     BP 130/84 (BP Location: Left Wrist)   Pulse 78   Temp (!) 97.1 F (36.2 C)   Ht 5\' 2"  (1.575 m)   Wt 251 lb (113.9 kg)   SpO2 97%   BMI 45.91 kg/m  BP Readings from Last 3 Encounters:  02/01/23 130/84  12/25/22 (!) 138/90  12/14/22 124/74   Wt Readings from Last 3 Encounters:  02/01/23 251 lb (113.9 kg)  12/25/22 255 lb (115.7 kg)  12/14/22 255 lb (115.7 kg)      Physical Exam Vitals and nursing note reviewed.  Constitutional:      General: She is not in acute distress.    Appearance: Normal appearance.  HENT:     Head: Normocephalic.     Right Ear: Tympanic membrane, ear canal and external ear normal.     Left Ear: Tympanic membrane, ear canal and external ear normal.  Eyes:     Conjunctiva/sclera: Conjunctivae normal.  Cardiovascular:     Rate and Rhythm: Normal rate and regular rhythm.     Pulses: Normal pulses.     Heart sounds: Normal heart sounds.  Pulmonary:     Effort: Pulmonary effort is normal.     Breath sounds: Normal breath sounds.  Musculoskeletal:     Cervical back: Normal range of motion.  Skin:    General: Skin is warm.  Neurological:     General: No focal deficit present.     Mental Status: She is alert and oriented to person,  place, and time.  Psychiatric:        Mood and Affect: Mood normal.        Behavior: Behavior normal.        Thought Content: Thought content normal.        Judgment: Judgment normal.    The 10-year ASCVD risk score (Arnett DK, et al., 2019) is: 50.5%    Assessment & Plan:   Problem List Items Addressed This Visit       Cardiovascular and Mediastinum   Paroxysmal SVT (supraventricular tachycardia)    She saw a new cardiologist since her other one retired and was told that she never had A-fib, she has runs of SVT. Will have her continue diltiazem 120mg  daily. Continue collaboration and recommendations from cardiology.         Respiratory   Other allergic rhinitis    She states her symptoms have been controlled. Will stop her montelukast and she can take zyrtec or claritin 10mg  daily as needed.         Endocrine   Type 2 diabetes mellitus without complications (HCC) - Primary    Chronic, stable.  She has lost another 4 pounds since starting the Ozempic, congratulated her on this!  Continue Ozempic 0.25 mg injection  weekly.  Discussed drinking plenty of water. Up to date on eye exam, pneumonia vaccine, and foot exam.  Discussed starting a statin, however she would like to hold off until we get her lipid panel back. Check CMP, CBC, A1c, and lipid panel today. Follow-up in 3 months.      Relevant Orders   CBC with Differential/Platelet   Comprehensive metabolic panel   Lipid panel   Hemoglobin A1c     Other   Morbid obesity (HCC)    She has lost another 4 pounds since her last visit, congratulated her on this!  Continue Ozempic 0.25 mg injection weekly.       Return in about 3 months (around 05/04/2023) for CPE.    Gerre Scull, NP

## 2023-02-08 DIAGNOSIS — N200 Calculus of kidney: Secondary | ICD-10-CM | POA: Diagnosis not present

## 2023-02-08 DIAGNOSIS — R8279 Other abnormal findings on microbiological examination of urine: Secondary | ICD-10-CM | POA: Diagnosis not present

## 2023-02-09 DIAGNOSIS — N200 Calculus of kidney: Secondary | ICD-10-CM | POA: Diagnosis not present

## 2023-04-19 ENCOUNTER — Telehealth: Payer: Self-pay

## 2023-04-19 NOTE — Patient Instructions (Signed)
Visit Information  Thank you for taking time to visit with me today. Please don't hesitate to contact me if I can be of assistance to you.   Following are the goals we discussed today:   Goals Addressed             This Visit's Progress    COMPLETED: Care Coordination [employer]-No follow up required       Care Coordination Interventions: Advised patient to Annual Wellness exam. Discussed [facility] services and support. Assessed SDOH. Advised to discuss with primary care physician if services needed in the future.         If you are experiencing a Mental Health or Behavioral Health Crisis or need someone to talk to, please call the Suicide and Crisis Lifeline: 988   Patient verbalizes understanding of instructions and care plan provided today and agrees to view in MyChart. Active MyChart status and patient understanding of how to access instructions and care plan via MyChart confirmed with patient.     The patient has been provided with contact information for the care management team and has been advised to call with any health related questions or concerns.   Bary Leriche, RN, MSN [facility] Management Community Coordinator Direct Dial: [phone]  Fax: [phone] Website: [email or web address]

## 2023-04-19 NOTE — Patient Outreach (Signed)
Care Coordination   Initial Visit Note   04/19/2023 Name: NELL MILBOURNE MRN: [id number] DOB: 1945/12/28  CECILLIA BIEBER is a 77 y.o. year old female who sees McElwee, Lauren A, NP for primary care. I spoke with  [name] by phone today.  What matters to the patients health and wellness today?  none    Goals Addressed             This Visit's Progress    COMPLETED: Care Coordination Actvities-No follow up required       Care Coordination Interventions: Advised patient to Annual Wellness exam. Discussed [facility] services and support. Assessed SDOH. Advised to discuss with primary care physician if services needed in the future.        SDOH assessments and interventions completed:  Yes  SDOH Interventions Today    Flowsheet Row Most Recent Value  SDOH Interventions   Food Insecurity Interventions Intervention Not Indicated  Transportation Interventions Intervention Not Indicated  Utilities Interventions Intervention Not Indicated        Care Coordination Interventions:  Yes, provided   Follow up plan: No further intervention required.   Encounter Outcome:  Patient Visit Completed   Bary Leriche, RN, MSN [facility]  [facility] Management Community Coordinator Direct Dial: [phone]  Fax: [phone] Website: [email or web address]

## 2023-05-04 ENCOUNTER — Encounter: Payer: PPO | Admitting: Nurse Practitioner

## 2023-05-12 ENCOUNTER — Ambulatory Visit (INDEPENDENT_AMBULATORY_CARE_PROVIDER_SITE_OTHER): Payer: PPO | Admitting: Nurse Practitioner

## 2023-05-12 ENCOUNTER — Encounter: Payer: Self-pay | Admitting: Nurse Practitioner

## 2023-05-12 ENCOUNTER — Telehealth: Payer: Self-pay | Admitting: Nurse Practitioner

## 2023-05-12 ENCOUNTER — Encounter: Payer: PPO | Admitting: Nurse Practitioner

## 2023-05-12 VITALS — BP 136/90 | HR 95 | Temp 98.1°F | Ht 62.0 in | Wt 252.0 lb

## 2023-05-12 DIAGNOSIS — Z23 Encounter for immunization: Secondary | ICD-10-CM | POA: Diagnosis not present

## 2023-05-12 DIAGNOSIS — E7800 Pure hypercholesterolemia, unspecified: Secondary | ICD-10-CM | POA: Diagnosis not present

## 2023-05-12 DIAGNOSIS — F419 Anxiety disorder, unspecified: Secondary | ICD-10-CM | POA: Diagnosis not present

## 2023-05-12 DIAGNOSIS — E119 Type 2 diabetes mellitus without complications: Secondary | ICD-10-CM

## 2023-05-12 DIAGNOSIS — Z7985 Long-term (current) use of injectable non-insulin antidiabetic drugs: Secondary | ICD-10-CM

## 2023-05-12 DIAGNOSIS — E559 Vitamin D deficiency, unspecified: Secondary | ICD-10-CM | POA: Diagnosis not present

## 2023-05-12 DIAGNOSIS — I10 Essential (primary) hypertension: Secondary | ICD-10-CM

## 2023-05-12 DIAGNOSIS — I471 Supraventricular tachycardia, unspecified: Secondary | ICD-10-CM

## 2023-05-12 DIAGNOSIS — Z0000 Encounter for general adult medical examination without abnormal findings: Secondary | ICD-10-CM | POA: Diagnosis not present

## 2023-05-12 DIAGNOSIS — I5032 Chronic diastolic (congestive) heart failure: Secondary | ICD-10-CM

## 2023-05-12 DIAGNOSIS — M5416 Radiculopathy, lumbar region: Secondary | ICD-10-CM | POA: Diagnosis not present

## 2023-05-12 DIAGNOSIS — L304 Erythema intertrigo: Secondary | ICD-10-CM | POA: Diagnosis not present

## 2023-05-12 LAB — LIPID PANEL
Cholesterol: 200 mg/dL (ref 0–200)
HDL: 82.4 mg/dL (ref >39.00)
LDL Cholesterol: 97 mg/dL (ref 0–99)
NonHDL: 117.46
Total CHOL/HDL Ratio: 2
Triglycerides: 103 mg/dL (ref 0.0–149.0)
VLDL: 20.6 mg/dL (ref 0.0–40.0)

## 2023-05-12 LAB — COMPREHENSIVE METABOLIC PANEL
ALT: 15 U/L (ref 0–35)
AST: 13 U/L (ref 0–37)
Albumin: 4.1 g/dL (ref 3.5–5.2)
Alkaline Phosphatase: 100 U/L (ref 39–117)
BUN: 22 mg/dL (ref 6–23)
CO2: 29 meq/L (ref 19–32)
Calcium: 9.1 mg/dL (ref 8.4–10.5)
Chloride: 103 meq/L (ref 96–112)
Creatinine, Ser: 0.99 mg/dL (ref 0.40–1.20)
GFR: 54.57 mL/min — ABNORMAL LOW (ref >60.00)
Glucose, Bld: 137 mg/dL — ABNORMAL HIGH (ref 70–99)
Potassium: 4.3 meq/L (ref 3.5–5.1)
Sodium: 141 meq/L (ref 135–145)
Total Bilirubin: 0.8 mg/dL (ref 0.2–1.2)
Total Protein: 7.3 g/dL (ref 6.0–8.3)

## 2023-05-12 LAB — HEMOGLOBIN A1C: Hgb A1c MFr Bld: 6.8 % — ABNORMAL HIGH (ref 4.6–6.5)

## 2023-05-12 MED ORDER — KETOCONAZOLE 2 % EX CREA: 1.0000 | TOPICAL_CREAM | Freq: Every day | CUTANEOUS | 1 refills | Status: AC

## 2023-05-12 MED ORDER — TIRZEPATIDE 2.5 MG/0.5ML ~~LOC~~ SOAJ: 2.5000 mg | SUBCUTANEOUS | 0 refills | Status: DC

## 2023-05-12 MED ORDER — TIRZEPATIDE 2.5 MG/0.5ML ~~LOC~~ SOAJ: 2.5000 mg | SUBCUTANEOUS | 1 refills | Status: DC

## 2023-05-12 MED ORDER — GABAPENTIN 100 MG PO CAPS: 100.0000 mg | ORAL_CAPSULE | Freq: Three times a day (TID) | ORAL | 3 refills | Status: AC | PRN

## 2023-05-12 NOTE — Telephone Encounter (Signed)
Can we get a prior auth for tirzepatide [facility]) 2.5 MG/0.5ML Pen ?

## 2023-05-12 NOTE — Assessment & Plan Note (Signed)
Noted on her bilateral breasts.  Will have her start ketoconazole cream daily as needed.

## 2023-05-12 NOTE — Patient Instructions (Addendum)
It was great to see you!  Stop the ozempic and start mounjaro injection once a week.   Start ketoconazole cream daily under your breast and inner thighs as needed  Let's follow-up in 3 months, sooner if you have concerns.  If a referral was placed today, you will be contacted for an appointment. Please note that routine referrals can sometimes take up to 3-4 weeks to process. Please call our office if you haven't heard anything after this time frame.  Take care,  Rodman Pickle, NP

## 2023-05-12 NOTE — Assessment & Plan Note (Signed)
She declines statin today.  Check lipid panel treat based on results.

## 2023-05-12 NOTE — Assessment & Plan Note (Signed)
Chronic, stable. Follow-up with any concerns.

## 2023-05-12 NOTE — Assessment & Plan Note (Signed)
Chronic, not controlled.  She is still having some pain that radiates down her left leg.  Continue gabapentin 100 mg 3 times daily as needed.  Will also place her referral to physical therapy.  Follow-up in 3 months.

## 2023-05-12 NOTE — Assessment & Plan Note (Signed)
Weight has been stable, however she stopped taking the Ozempic due to side effects.  Will restart Mounjaro 2.5 mg injection weekly.  Continue focus on nutrition and increasing exercise.

## 2023-05-12 NOTE — Telephone Encounter (Signed)
I called and spoke with patient and notified her that I will send request to our prior auth department.

## 2023-05-12 NOTE — Progress Notes (Signed)
BP (!) 136/90   Pulse 95   Temp 98.1 F (36.7 C)   Ht 5\' 2"  (1.575 m)   Wt 252 lb (114.3 kg)   SpO2 97%   BMI 46.09 kg/m    Subjective:    Patient ID: [name], female    DOB: [birth year], 77 y.o.   MRN: [id number]  CC: Chief Complaint  Patient presents with  . Annual Exam    With fasting lab work    HPI: [name] is a 77 y.o. female presenting on 05/12/2023 for comprehensive medical examination. Current medical complaints include: Back pain, itching under breasts.  She has noticed chronic, itching under breasts, and vaginal area after urinating. She received fluticasone cream from her dermatologist, however it did not help.  She does note red areas at times.  She states that she also tried essential oils but they farmed and did not help.  She also notes that she is having ongoing back pain that will sometimes radiate down to her legs.  This is mostly on the left side, although sometimes will happen to the right.  She notes that she had back surgery and has been hesitant to do any exercise or stretching because she is afraid that she will break the metal in her back.  She states that she does have gabapentin at home, however has not been taking it.  She also notes that she has limited range of motion due to pain and stiffness.  She states that she feels like everything is just tight.  She currently lives with: husband Menopausal Symptoms: no  Depression and Anxiety Screen done today and results listed below:     05/12/2023    8:36 AM 11/02/2022   11:20 AM 05/01/2022    2:00 PM 05/30/2018    8:46 AM  Depression screen PHQ 2/9  Decreased Interest 0 0 0 3  Down, Depressed, Hopeless 0 0 0 0  PHQ - 2 Score 0 0 0 3  Altered sleeping 0 0  3  Tired, decreased energy 0 3  3  Change in appetite 0 0  3  Feeling bad or failure about yourself  0 0  0  Trouble concentrating 0 0  3  Moving slowly or fidgety/restless 0 0  1  Suicidal thoughts 0 0  0  PHQ-9 Score 0 3  16   Difficult doing work/chores    Very difficult      05/12/2023    8:36 AM 11/02/2022   11:20 AM 05/02/2022   12:41 PM  GAD 7 : Generalized Anxiety Score  Nervous, Anxious, on Edge 0 0 0  Control/stop worrying 0 0 0  Worry too much - different things 0 0 0  Trouble relaxing 0 0 0  Restless 0 0 0  Easily annoyed or irritable 0 0 0  Afraid - awful might happen 0 0 0  Total GAD 7 Score 0 0 0  Anxiety Difficulty   Not difficult at all    The patient does not have a history of falls. I did not complete a risk assessment for falls. A plan of care for falls was not documented.   Past Medical History:  Past Medical History:  Diagnosis Date  . Acute hypoxemic respiratory failure (HCC) 10/24/2016  . Arthritis    both knees  . Carpal tunnel syndrome, bilateral    both hands go to sleep at times from elbows down  . Chronic diastolic heart failure (HCC)   .  DDD (degenerative disc disease), lumbosacral   . Diabetes mellitus without complication (HCC)   . Dyspnea    during chronic bronchitis, resolved at this time  . First degree AV block   . Hematuria 11/15/2017  . History of kidney stones   . History of rib fracture 03/2011  . Lumbar stenosis   . Obesity   . Osteoporosis   . Postlaminectomy syndrome   . Pre-diabetes   . Renal calculi    BILATERAL  . Right ureteral stone   . Rotator cuff tear, left   . Rotator cuff tear, right   . UTI (urinary tract infection)     Surgical History:  Past Surgical History:  Procedure Laterality Date  . ANTERIOR CERVICAL DECOMP/DISCECTOMY FUSION  03-18-2011   C3 -- C7  . ANTERIOR LATERAL LUMBAR FUSION 4 LEVELS N/A 05/06/2018   Procedure: Lumbar one- two Lumbar two -three Lumbar  three-four Lumbar four-five Anterolateral decompression/fusion/posterior bilateral laminectomy Lumbar four-five Lumbar five Sacral one/posterior percutaneous fixation/mazor;  Surgeon: Barnett Abu, MD;  Location: [facility] OR;  Service: Neurosurgery;  Laterality: N/A;  .  APPENDECTOMY    . APPLICATION OF ROBOTIC ASSISTANCE FOR SPINAL PROCEDURE N/A 05/06/2018   Procedure: APPLICATION OF ROBOTIC ASSISTANCE FOR SPINAL PROCEDURE;  Surgeon: Barnett Abu, MD;  Location: [facility] OR;  Service: Neurosurgery;  Laterality: N/A;  . BILATERAL CARPAL TUNNEL RELEASE     3 screws in right hand, thumb, middle finger, and index finger,   . CARDIAC CATHETERIZATION N/A 01/16/2015   Procedure: Left Heart Cath and Coronary Angiography;  Surgeon: Lyn Records, MD;  Location: [facility] INVASIVE CV LAB;  Service: Cardiovascular;  Laterality: N/A;  . CHOLECYSTECTOMY OPEN  1970'S  . COLONOSCOPY  2018  . CYSTOSCOPY WITH RETROGRADE PYELOGRAM, URETEROSCOPY AND STENT PLACEMENT N/A 02/13/2013   Procedure: CYSTOSCOPY WITH RETROGRADE PYELOGRAM, URETEROSCOPY AND LITHOTRIPSY ;  Surgeon: Garnett Farm, MD;  Location: [facility];  Service: Urology;  Laterality: N/A;  . CYSTOSCOPY WITH URETEROSCOPY AND STENT PLACEMENT Left 04/17/2019   Procedure: CYSTOSCOPY WITH LEFT  RETROGRADE PYELOGRAM, LEFT URETEROSCOPY, HOLMIUM LASER AND LEFT STENT PLACEMENT;  Surgeon: Ihor Gully, MD;  Location: [facility];  Service: Urology;  Laterality: Left;  . CYSTOSCOPY/URETEROSCOPY/HOLMIUM LASER/STENT PLACEMENT Right 11/22/2017   Procedure: CYSTOSCOPY/URETEROSCOPY/RETROGRADE PYELOGRAM/HOLMIUM LASER/STENT PLACEMENT;  Surgeon: Ihor Gully, MD;  Location: [facility];  Service: Urology;  Laterality: Right;  . EXTRACORPOREAL SHOCK WAVE LITHOTRIPSY Right 05-16-2012  . FINGER ARTHROSCOPY WITH CARPOMETACARPEL ([facility]) ARTHROPLASTY    . HOLMIUM LASER APPLICATION Right 02/13/2013   Procedure: HOLMIUM LASER APPLICATION;  Surgeon: Garnett Farm, MD;  Location: [facility];  Service: Urology;  Laterality: Right;  . IR URETERAL STENT LEFT NEW ACCESS W/O SEP NEPHROSTOMY CATH  02/13/2019  . KNEE ARTHROSCOPY Bilateral   . LUMBAR LAMINECTOMY  X2  1960'S  . LUMBAR PERCUTANEOUS PEDICLE SCREW 4 LEVEL N/A 05/06/2018    Procedure: LUMBAR PERCUTANEOUS PEDICLE SCREW LUMBAR ONE TO LUMBAR FIVE WITH MAZOR ;POSTERIOR LATERAL LAMINECTOMY LUMBAR FOUR-FIVE,FIVE-SACRAL ONE;  Surgeon: Barnett Abu, MD;  Location: [facility];  Service: Neurosurgery;  Laterality: N/A;  . NASAL SEPTUM SURGERY  1970'S  . NEPHROLITHOTOMY Left 02/20/2019   Procedure: NEPHROLITHOTOMY PERCUTANEOUS;  Surgeon: Ihor Gully, MD;  Location: [facility];  Service: Urology;  Laterality: Left;  . SHOULDER OPEN ROTATOR CUFF REPAIR Bilateral LEFT 01-11-2001/   RIGHT 07-21-2002  . TOTAL KNEE ARTHROPLASTY  04/13/2012   Procedure: TOTAL KNEE ARTHROPLASTY;  Surgeon: Loanne Drilling, MD;  Location: [facility];  Service: Orthopedics;  Laterality: Left;  . TOTAL KNEE ARTHROPLASTY  08/08/2012   Procedure: TOTAL KNEE ARTHROPLASTY;  Surgeon: Loanne Drilling, MD;  Location: [facility];  Service: Orthopedics;  Laterality: Right;  . TRANSTHORACIC ECHOCARDIOGRAM  03-29-2011   MODERATE LVH/ EF 60-65%/ MILD MR  . URETEROSCOPY WITH HOLMIUM LASER LITHOTRIPSY Left 06/19/2019   Procedure: URETEROSCOPY WITH HOLMIUM LASER LITHOTRIPSY/INSERTION OF DOUBLE J STENT;  Surgeon: Ihor Gully, MD;  Location: [facility];  Service: Urology;  Laterality: Left;  Marland Kitchen VAGINAL HYSTERECTOMY  1960'S    Medications:  Current Outpatient Medications on File Prior to Visit  Medication Sig  . aspirin EC 81 MG tablet Take 81 mg by mouth daily. Swallow whole.  . diclofenac sodium (VOLTAREN) 1 % GEL Apply 2 g topically 3 (three) times daily as needed (back pain).   Marland Kitchen diltiazem (CARDIZEM CD) 120 MG 24 hr capsule Take 1 capsule (120 mg total) by mouth daily.  Marland Kitchen lidocaine 4 % Place 1 patch onto the skin daily.   No current facility-administered medications on file prior to visit.    Allergies:  Allergies  Allergen Reactions  . Contrast Media [Iodinated Contrast Media] Hives and Rash    Omnipaque.  Rash, itching  . Cipro [Ciprofloxacin Hcl] Nausea And Vomiting    Pt reports she thinks she is allergic and reports  its caused her nausea, anxious, and weakness. Pt unsure.  . Doxycycline Nausea And Vomiting and Other (See Comments)  . Gabapentin Other (See Comments)    Too drowsy  . Metformin Hcl Diarrhea and Other (See Comments)  . Pregabalin Other (See Comments)    Too drowsy  . Semaglutide(0.25 Or 0.5mg -Dos) Other (See Comments)    Stomach upset  . Ancef [Cefazolin] Rash    Rash to ancef vs omniopaue contrast  . Augmentin [Amoxicillin-Pot Clavulanate] Itching, Nausea And Vomiting and Rash    Did it involve swelling of the face/tongue/throat, SOB, or low BP? Unknown Did it involve sudden or severe rash/hives, skin peeling, or any reaction on the inside of your mouth or nose? Yes Did you need to seek medical attention at a hospital or doctor's office? No When did it last happen? 2020      If all above answers are "NO", may proceed with cephalosporin use.   . Sulfa Antibiotics Itching, Nausea And Vomiting, Rash and Other (See Comments)  . Tape Swelling    Adhesive Tape    Social History:  Social History   Socioeconomic History  . Marital status: Married    Spouse name: Not on file  . Number of children: Not on file  . Years of education: Not on file  . Highest education level: Not on file  Occupational History  . Not on file  Tobacco Use  . Smoking status: Never  . Smokeless tobacco: Never  Vaping Use  . Vaping status: Never Used  Substance and Sexual Activity  . Alcohol use: No  . Drug use: No  . Sexual activity: Yes    Partners: Male    Birth control/protection: Surgical  Other Topics Concern  . Not on file  Social History Narrative  . Not on file   Social Determinants of Health   Financial Resource Strain: Not on file  Food Insecurity: No Food Insecurity (04/19/2023)   Hunger Vital Sign   . Worried About Programme researcher, broadcasting/film/video in the Last Year: Never true   . Ran Out of Food in the Last Year: Never true  Transportation Needs: Unknown (  04/19/2023)   PRAPARE - Transportation    . Lack of Transportation (Medical): No   . Lack of Transportation (Non-Medical): Not on file  Physical Activity: Not on file  Stress: Not on file  Social Connections: Not on file  Intimate Partner Violence: Not At Risk (02/10/2022)   Humiliation, Afraid, Rape, and Kick questionnaire   . Fear of Current or Ex-Partner: No   . Emotionally Abused: No   . Physically Abused: No   . Sexually Abused: No   Social History   Tobacco Use  Smoking Status Never  Smokeless Tobacco Never   Social History   Substance and Sexual Activity  Alcohol Use No    Family History:  Family History  Problem Relation Age of Onset  . Lung cancer Mother   . Diabetes type II Mother   . Lung cancer Father   . Diabetes Sister   . Hypertension Sister   . Hypertension Brother   . Diabetes Brother   . Heart disease Brother   . Diabetes Brother   . Hypertension Brother   . Heart disease Brother     Past medical history, surgical history, medications, allergies, family history and social history reviewed with patient today and changes made to appropriate areas of the chart.   Review of Systems  Constitutional:  Positive for malaise/fatigue.  HENT: Negative.    Eyes: Negative.   Respiratory: Negative.    Cardiovascular: Negative.   Gastrointestinal:  Positive for constipation and heartburn. Negative for abdominal pain.  Genitourinary:  Positive for frequency and urgency. Negative for dysuria.  Musculoskeletal:  Positive for back pain.  Skin:  Positive for itching (under breasts).  Neurological: Negative.   Psychiatric/Behavioral: Negative.     All other ROS negative except what is listed above and in the HPI.      Objective:    BP (!) 136/90   Pulse 95   Temp 98.1 F (36.7 C)   Ht 5\' 2"  (1.575 m)   Wt 252 lb (114.3 kg)   SpO2 97%   BMI 46.09 kg/m   Wt Readings from Last 3 Encounters:  05/12/23 252 lb (114.3 kg)  02/01/23 251 lb (113.9 kg)  12/25/22 255 lb (115.7 kg)    Physical  Exam Vitals and nursing note reviewed.  Constitutional:      General: She is not in acute distress.    Appearance: Normal appearance. She is obese.  HENT:     Head: Normocephalic and atraumatic.     Right Ear: Tympanic membrane, ear canal and external ear normal.     Left Ear: Tympanic membrane, ear canal and external ear normal.  Eyes:     Conjunctiva/sclera: Conjunctivae normal.  Cardiovascular:     Rate and Rhythm: Normal rate and regular rhythm.     Pulses: Normal pulses.     Heart sounds: Normal heart sounds.  Pulmonary:     Effort: Pulmonary effort is normal.     Breath sounds: Normal breath sounds.  Abdominal:     Palpations: Abdomen is soft.     Tenderness: There is no abdominal tenderness.  Musculoskeletal:        General: No tenderness.     Cervical back: Normal range of motion and neck supple.     Right lower leg: No edema.     Left lower leg: No edema.     Comments: Limited lumbar ROM  Lymphadenopathy:     Cervical: No cervical adenopathy.  Skin:    General: Skin  is warm and dry.  Neurological:     General: No focal deficit present.     Mental Status: She is alert and oriented to person, place, and time.     Cranial Nerves: No cranial nerve deficit.     Coordination: Coordination normal.     Gait: Gait normal.  Psychiatric:        Mood and Affect: Mood normal.        Behavior: Behavior normal.        Thought Content: Thought content normal.        Judgment: Judgment normal.    Results for orders placed or performed in visit on 02/01/23  CBC with Differential/Platelet  Result Value Ref Range   WBC 9.9 4.0 - 10.5 K/uL   RBC 4.38 3.87 - 5.11 Mil/uL   Hemoglobin 12.6 12.0 - 15.0 g/dL   HCT 16.1 09.6 - 04.5 %   MCV 88.4 78.0 - 100.0 fl   MCHC 32.4 30.0 - 36.0 g/dL   RDW 40.9 81.1 - 91.4 %   Platelets 388.0 150.0 - 400.0 K/uL   Neutrophils Relative % 68.0 43.0 - 77.0 %   Lymphocytes Relative 22.2 12.0 - 46.0 %   Monocytes Relative 5.6 3.0 - 12.0 %    Eosinophils Relative 3.0 0.0 - 5.0 %   Basophils Relative 1.2 0.0 - 3.0 %   Neutro Abs 6.8 1.4 - 7.7 K/uL   Lymphs Abs 2.2 0.7 - 4.0 K/uL   Monocytes Absolute 0.6 0.1 - 1.0 K/uL   Eosinophils Absolute 0.3 0.0 - 0.7 K/uL   Basophils Absolute 0.1 0.0 - 0.1 K/uL  Comprehensive metabolic panel  Result Value Ref Range   Sodium 140 135 - 145 mEq/L   Potassium 4.6 3.5 - 5.1 mEq/L   Chloride 103 96 - 112 mEq/L   CO2 27 19 - 32 mEq/L   Glucose, Bld 105 (H) 70 - 99 mg/dL   BUN 18 6 - 23 mg/dL   Creatinine, Ser 7.82 0.40 - 1.20 mg/dL   Total Bilirubin 0.7 0.2 - 1.2 mg/dL   Alkaline Phosphatase 100 39 - 117 U/L   AST 12 0 - 37 U/L   ALT 11 0 - 35 U/L   Total Protein 7.0 6.0 - 8.3 g/dL   Albumin 4.1 3.5 - 5.2 g/dL   GFR 95.62 (L) >13.08 mL/min   Calcium 9.4 8.4 - 10.5 mg/dL  Lipid panel  Result Value Ref Range   Cholesterol 192 0 - 200 mg/dL   Triglycerides 657.8 0.0 - 149.0 mg/dL   HDL 46.96 >29.52 mg/dL   VLDL 84.1 0.0 - 32.4 mg/dL   LDL Cholesterol 98 0 - 99 mg/dL   Total CHOL/HDL Ratio 3    NonHDL 119.73   Hemoglobin A1c  Result Value Ref Range   Hgb A1c MFr Bld 6.4 4.6 - 6.5 %      Assessment & Plan:   Problem List Items Addressed This Visit       Cardiovascular and Mediastinum   Chronic diastolic heart failure (HCC)    Chronic, stable.  She is euvolemic on exam today.  She can continue Lasix 20 mg daily as needed.      Essential hypertension    Chronic, stable.  BP today 126/80.  Continue diltiazem 120 mg daily.      Paroxysmal SVT (supraventricular tachycardia) (HCC)    Chronic, stable. Will have her continue diltiazem 120mg  daily. Continue collaboration and recommendations from cardiology.  Endocrine   Type 2 diabetes mellitus without complications (HCC)    Chronic, stable.  She was unable to tolerate the Ozempic with headaches, nausea, reflux.  Will have her start Mounjaro 2.5 mg injection weekly to see if she tolerates this better.  Encouraged her to  drink plenty of fluids.  Recommend that she also take MiraLAX as needed for constipation.  She declines statin today.  Check CMP, A1c, lipid panel today.  Follow-up in 3 months.      Relevant Medications   tirzepatide [facility]) 2.5 MG/0.5ML Pen   Other Relevant Orders   Comprehensive metabolic panel   Hemoglobin A1c   Lipid panel     Nervous and Auditory   Lumbar radiculopathy    Chronic, not controlled.  She is still having some pain that radiates down her left leg.  Continue gabapentin 100 mg 3 times daily as needed.  Will also place her referral to physical therapy.  Follow-up in 3 months.      Relevant Medications   gabapentin (NEURONTIN) 100 MG capsule   Other Relevant Orders   Ambulatory referral to Physical Therapy     Musculoskeletal and Integument   Intertrigo    Noted on her bilateral breasts.  Will have her start ketoconazole cream daily as needed.        Other   Morbid obesity (HCC)    Weight has been stable, however she stopped taking the Ozempic due to side effects.  Will restart Mounjaro 2.5 mg injection weekly.  Continue focus on nutrition and increasing exercise.      Relevant Medications   tirzepatide (MOUNJARO) 2.5 MG/0.5ML Pen   Anxiety    Chronic, stable.  Follow-up with any concerns.      HLD (hyperlipidemia)    She declines statin today.  Check lipid panel treat based on results.      Vitamin D deficiency    Continue a daily over-the-counter vitamin D supplement.      Routine general medical examination at a health care facility - Primary    Health maintenance reviewed and updated. Discussed nutrition, exercise. Check CMP today. Follow-up 1 year.        Other Visit Diagnoses     Immunization due       Flu vaccine given today   Relevant Orders   Flu Vaccine Trivalent High Dose (Fluad) (Completed)        Follow up plan: Return in about 3 months (around 08/12/2023) for Diabetes.   LABORATORY TESTING:  - Pap smear: not  applicable  IMMUNIZATIONS:   - Tdap: Tetanus vaccination status reviewed: last tetanus booster within 10 years. - Influenza: Administered today - Pneumovax: Up to date - Prevnar: Up to date - HPV: Not applicable - Shingrix vaccine:  Recommend she get at her pharmacy  SCREENING: -Mammogram: Up to date  - Colonoscopy: Not applicable  - Bone Density: Up to date   PATIENT COUNSELING:   Advised to take 1 mg of folate supplement per day if capable of pregnancy.   Sexuality: Discussed sexually transmitted diseases, partner selection, use of condoms, avoidance of unintended pregnancy  and contraceptive alternatives.   Advised to avoid cigarette smoking.  I discussed with the patient that most people either abstain from alcohol or drink within safe limits (<=14/week and <=4 drinks/occasion for males, <=7/weeks and <= 3 drinks/occasion for females) and that the risk for alcohol disorders and other health effects rises proportionally with the number of drinks per week and how often a  drinker exceeds daily limits.  Discussed cessation/primary prevention of drug use and availability of treatment for abuse.   Diet: Encouraged to adjust caloric intake to maintain  or achieve ideal body weight, to reduce intake of dietary saturated fat and total fat, to limit sodium intake by avoiding high sodium foods and not adding table salt, and to maintain adequate dietary potassium and calcium preferably from fresh fruits, vegetables, and low-fat dairy products.    stressed the importance of regular exercise  Injury prevention: Discussed safety belts, safety helmets, smoke detector, smoking near bedding or upholstery.   Dental health: Discussed importance of regular tooth brushing, flossing, and dental visits.    NEXT PREVENTATIVE PHYSICAL DUE IN 1 YEAR. Return in about 3 months (around 08/12/2023) for Diabetes.  Annaliesa Blann A Jerita Wimbush

## 2023-05-12 NOTE — Assessment & Plan Note (Signed)
Health maintenance reviewed and updated. Discussed nutrition, exercise. Check CMP today. Follow-up 1 year.

## 2023-05-12 NOTE — Assessment & Plan Note (Signed)
Chronic, stable.  BP today 126/80.  Continue diltiazem 120 mg daily.

## 2023-05-12 NOTE — Assessment & Plan Note (Signed)
Chronic, stable.  She is euvolemic on exam today.  She can continue Lasix 20 mg daily as needed.

## 2023-05-12 NOTE — Assessment & Plan Note (Signed)
Chronic, stable. Will have her continue diltiazem 120mg  daily. Continue collaboration and recommendations from cardiology.

## 2023-05-12 NOTE — Telephone Encounter (Signed)
Pt stated that the pharmacy told her that they need your approval before they fill.  tirzepatide [facility]) 2.5 MG/0.5ML Pen [[id number]]   [id number], [place] - [address] AT [facility] [address], [place] [address] Phone: [phone]  Fax: [phone]

## 2023-05-12 NOTE — Assessment & Plan Note (Signed)
Chronic, stable.  She was unable to tolerate the Ozempic with headaches, nausea, reflux.  Will have her start Mounjaro 2.5 mg injection weekly to see if she tolerates this better.  Encouraged her to drink plenty of fluids.  Recommend that she also take MiraLAX as needed for constipation.  She declines statin today.  Check CMP, A1c, lipid panel today.  Follow-up in 3 months.

## 2023-05-12 NOTE — Assessment & Plan Note (Signed)
Continue a daily over-the-counter vitamin D supplement.

## 2023-05-13 ENCOUNTER — Telehealth: Payer: Self-pay

## 2023-05-13 ENCOUNTER — Telehealth: Payer: Self-pay | Admitting: Nurse Practitioner

## 2023-05-13 ENCOUNTER — Other Ambulatory Visit (HOSPITAL_COMMUNITY): Payer: Self-pay

## 2023-05-13 NOTE — Telephone Encounter (Signed)
Patient notified of below message and will pick up medicine today.

## 2023-05-13 NOTE — Telephone Encounter (Signed)
Patient notified of message that Rx was approved.

## 2023-05-13 NOTE — Telephone Encounter (Signed)
Pt called and said she change her mind after she talk to you . Pt decided  to bet tirzepatide [facility]) 2.5 MG/0.5ML Pen [[id number]] for the 90 days supply

## 2023-05-13 NOTE — Telephone Encounter (Signed)
Pharmacy Patient Advocate Encounter  Received notification from [facility] [employer]VANT[employer]/RX [employer]VANCE that Prior Authorization for [facility] 0.25mg /0.60ml has been APPROVED from 05/13/23 to 05/12/24. Spoke to pharmacy to process.Copay is $47 for 28 day supply.    PA #/Case ID/Reference #: [id number]

## 2023-05-13 NOTE — Telephone Encounter (Signed)
Pharmacy Patient Advocate Encounter   Received notification from Pt Calls Messages that prior authorization for Mounjaro 0.25mg /0.82ml is required/requested.   Insurance verification completed.   The patient is insured through [facility] ADVANTAGE/RX ADVANCE .   Per test claim: PA required; PA submitted to above mentioned insurance via CoverMyMeds Key/confirmation #/EOC [id number] Status is pending

## 2023-06-15 ENCOUNTER — Ambulatory Visit: Payer: PPO | Admitting: Physical Therapy

## 2023-07-02 ENCOUNTER — Encounter: Payer: Self-pay | Admitting: Nurse Practitioner

## 2023-07-02 MED ORDER — OZEMPIC (0.25 OR 0.5 MG/DOSE) 2 MG/1.5ML ~~LOC~~ SOPN: 0.2500 mg | PEN_INJECTOR | SUBCUTANEOUS | 1 refills | Status: DC

## 2023-07-15 ENCOUNTER — Encounter: Payer: Self-pay | Admitting: Nurse Practitioner

## 2023-08-12 ENCOUNTER — Ambulatory Visit: Payer: PPO | Admitting: Nurse Practitioner

## 2023-09-15 ENCOUNTER — Ambulatory Visit: Payer: PPO | Admitting: Nurse Practitioner

## 2023-10-11 DIAGNOSIS — H04123 Dry eye syndrome of bilateral lacrimal glands: Secondary | ICD-10-CM | POA: Diagnosis not present

## 2023-10-11 LAB — HM DIABETES EYE EXAM

## 2023-10-20 ENCOUNTER — Encounter: Payer: Self-pay | Admitting: Nurse Practitioner

## 2023-10-20 ENCOUNTER — Ambulatory Visit: Admitting: Nurse Practitioner

## 2023-10-20 VITALS — BP 150/78 | HR 77 | Temp 97.2°F | Ht 62.0 in | Wt 250.0 lb

## 2023-10-20 DIAGNOSIS — N3001 Acute cystitis with hematuria: Secondary | ICD-10-CM | POA: Diagnosis not present

## 2023-10-20 DIAGNOSIS — M5416 Radiculopathy, lumbar region: Secondary | ICD-10-CM

## 2023-10-20 DIAGNOSIS — I10 Essential (primary) hypertension: Secondary | ICD-10-CM | POA: Diagnosis not present

## 2023-10-20 DIAGNOSIS — N898 Other specified noninflammatory disorders of vagina: Secondary | ICD-10-CM

## 2023-10-20 DIAGNOSIS — I5032 Chronic diastolic (congestive) heart failure: Secondary | ICD-10-CM

## 2023-10-20 DIAGNOSIS — E7800 Pure hypercholesterolemia, unspecified: Secondary | ICD-10-CM

## 2023-10-20 DIAGNOSIS — E119 Type 2 diabetes mellitus without complications: Secondary | ICD-10-CM

## 2023-10-20 DIAGNOSIS — I471 Supraventricular tachycardia, unspecified: Secondary | ICD-10-CM | POA: Diagnosis not present

## 2023-10-20 LAB — POC URINALSYSI DIPSTICK (AUTOMATED)
Bilirubin, UA: NEGATIVE
Blood, UA: POSITIVE
Glucose, UA: NEGATIVE
Ketones, UA: NEGATIVE
Nitrite, UA: POSITIVE
Protein, UA: POSITIVE — AB
Spec Grav, UA: >=1.03 — AB (ref 1.010–1.025)
Urobilinogen, UA: 0.2 U/dL
pH, UA: 6 (ref 5.0–8.0)

## 2023-10-20 MED ORDER — DILTIAZEM HCL ER COATED BEADS 120 MG PO CP24: 120.0000 mg | ORAL_CAPSULE | Freq: Every day | ORAL | 3 refills | Status: DC

## 2023-10-20 MED ORDER — SULFAMETHOXAZOLE-TRIMETHOPRIM 800-160 MG PO TABS: 1.0000 | ORAL_TABLET | Freq: Two times a day (BID) | ORAL | 0 refills | Status: DC

## 2023-10-20 MED ORDER — LISINOPRIL 5 MG PO TABS: 5.0000 mg | ORAL_TABLET | Freq: Every day | ORAL | 0 refills | Status: DC

## 2023-10-20 MED ORDER — FLUCONAZOLE 150 MG PO TABS: 150.0000 mg | ORAL_TABLET | Freq: Once | ORAL | 0 refills | Status: AC

## 2023-10-20 NOTE — Assessment & Plan Note (Signed)
Chronic, stable. Will have her continue diltiazem 120mg  daily. Continue collaboration and recommendations from cardiology.

## 2023-10-20 NOTE — Patient Instructions (Signed)
 It was great to see you!  Start lisinopril 1 tablet daily   Start bactrim 1 tablet twice a day for 7 days, then take fluconazole 1 tablet   You can start an over the counter cranberry tablet daily to help prevent UTIs  I have placed a referral to the [facility]   Let's follow-up in 3 weeks , sooner if you have concerns.  If a referral was placed today, you will be contacted for an appointment. Please note that routine referrals can sometimes take up to 3-4 weeks to process. Please call our office if you haven't heard anything after this time frame.  Take care,  Rodman Pickle, NP

## 2023-10-20 NOTE — Assessment & Plan Note (Signed)
 Chronic, not controlled. Her blood pressure is 150/78 mmHg. She is currently on diltiazem 120mg  daily. Discussed starting lisinopril for renal protection and blood pressure management due to proteinuria likely from diabetes. Start lisinopril 5mg  daily. Recheck blood pressure and labs in three weeks.

## 2023-10-20 NOTE — Assessment & Plan Note (Signed)
 She experiences chronic pain and numbness in her leg and ankle post-back surgery, especially at night. She can increase gabapentin to 100-200mg  as needed daily.

## 2023-10-20 NOTE — Progress Notes (Signed)
 Established Patient Office Visit  Subjective   Patient ID: [name], female    DOB: 07/06/1946  Age: 78 y.o. MRN: [id number]  Chief Complaint  Patient presents with   Diabetes    Follow up, unable to empty bladder, vaginal discomfort,discuss weight loss options    HPI  Discussed the use of AI scribe software for clinical note transcription with the patient, who gave verbal consent to proceed.  History of Present Illness   [name], with a history of diabetes and hypertension, presents with worsening urinary symptoms over the past few weeks. She reports a sensation of incomplete bladder emptying and urinary frequency. She also experiences itching and burning, which she attributes to her urine. She has tried using wipes to alleviate the discomfort, but they seem to exacerbate the itching. She has self-medicated with Fluconazole, which she found in her drawer, and it has provided some relief. She denies any discharge. She has previously been treated with antibiotics, which she reports were effective.   She reports that she has stopped taking Ozempic due to the cost and has not been on any diabetes medication since November. She does not regularly check her blood sugars but reports that her HbA1c has been satisfactory. She also mentions a sensation of numbness and pain in her leg, which she attributes to a previous back surgery. She has been taking Gabapentin as needed for this.        ROS See pertinent positives and negatives per HPI.    Objective:     BP (!) 150/78 (BP Location: Left Arm, Cuff Size: Large)   Pulse 77   Temp (!) 97.2 F (36.2 C)   Ht 5\' 2"  (1.575 m)   Wt 250 lb (113.4 kg)   SpO2 97%   BMI 45.73 kg/m  BP Readings from Last 3 Encounters:  10/20/23 (!) 150/78  05/12/23 (!) 136/90  02/01/23 130/84   Wt Readings from Last 3 Encounters:  10/20/23 250 lb (113.4 kg)  05/12/23 252 lb (114.3 kg)  02/01/23 251 lb (113.9 kg)      Physical Exam Vitals and  nursing note reviewed.  Constitutional:      General: She is not in acute distress.    Appearance: Normal appearance.  HENT:     Head: Normocephalic.  Eyes:     Conjunctiva/sclera: Conjunctivae normal.  Cardiovascular:     Rate and Rhythm: Normal rate and regular rhythm.     Pulses: Normal pulses.     Heart sounds: Normal heart sounds.  Pulmonary:     Effort: Pulmonary effort is normal.     Breath sounds: Normal breath sounds.  Abdominal:     Palpations: Abdomen is soft.     Tenderness: There is no abdominal tenderness. There is no right CVA tenderness or left CVA tenderness.  Musculoskeletal:     Cervical back: Normal range of motion.  Skin:    General: Skin is warm.  Neurological:     General: No focal deficit present.     Mental Status: She is alert and oriented to person, place, and time.  Psychiatric:        Mood and Affect: Mood normal.        Behavior: Behavior normal.        Thought Content: Thought content normal.        Judgment: Judgment normal.      Results for orders placed or performed in visit on 10/20/23  POCT Urinalysis Dipstick (Automated)  Result Value  Ref Range   Color, UA     Clarity, UA     Glucose, UA Negative Negative   Bilirubin, UA Negative    Ketones, UA Negative    Spec Grav, UA >=1.030 (A) 1.010 - 1.025   Blood, UA Positive    pH, UA 6.0 5.0 - 8.0   Protein, UA Positive (A) Negative   Urobilinogen, UA 0.2 0.2 or 1.0 E.U./dL   Nitrite, UA Positive    Leukocytes, UA Moderate (2+) (A) Negative      The 10-year ASCVD risk score (Arnett DK, et al., 2019) is: 65.7%    Assessment & Plan:   Problem List Items Addressed This Visit       Cardiovascular and Mediastinum   Chronic diastolic heart failure (HCC)   Chronic, stable.  She is euvolemic on exam today.  She can continue Lasix 20 mg daily as needed.      Relevant Medications   diltiazem (CARDIZEM CD) 120 MG 24 hr capsule   lisinopril (ZESTRIL) 5 MG tablet   Essential  hypertension   Chronic, not controlled. Her blood pressure is 150/78 mmHg. She is currently on diltiazem 120mg  daily. Discussed starting lisinopril for renal protection and blood pressure management due to proteinuria likely from diabetes. Start lisinopril 5mg  daily. Recheck blood pressure and labs in three weeks.      Relevant Medications   diltiazem (CARDIZEM CD) 120 MG 24 hr capsule   lisinopril (ZESTRIL) 5 MG tablet   Paroxysmal SVT (supraventricular tachycardia) (HCC)   Chronic, stable. Will have her continue diltiazem 120mg  daily. Continue collaboration and recommendations from cardiology.       Relevant Medications   diltiazem (CARDIZEM CD) 120 MG 24 hr capsule   lisinopril (ZESTRIL) 5 MG tablet     Endocrine   Type 2 diabetes mellitus without complications (HCC)   Chronic, stable. Her diabetes is well-controlled with previous A1c levels. She is not on medication due to cost and side effects and is not monitoring blood glucose at home. Discussed glipizide if future A1c levels are high. Encourage dietary modifications for glycemic control. Check labs next visit in 3 weeks.       Relevant Medications   lisinopril (ZESTRIL) 5 MG tablet     Nervous and Auditory   Lumbar radiculopathy   She experiences chronic pain and numbness in her leg and ankle post-back surgery, especially at night. She can increase gabapentin to 100-200mg  as needed daily.         Other   Morbid obesity (HCC)   BMI 45.7. Weight is stable since stopping ozempic after being unable to afford it. Discussed nutrition, limiting portion sizes and sweets. Will place referral to [facility].       Relevant Orders   Amb Ref to Medical Weight Management   HLD (hyperlipidemia)   She declines statin. Check lipid panel next visit in 3 weeks.       Relevant Medications   diltiazem (CARDIZEM CD) 120 MG 24 hr capsule   lisinopril (ZESTRIL) 5 MG tablet   Other Visit Diagnoses       Acute  cystitis with hematuria    -  Primary   Start bactrim BID x7 days - worked well for her last year. Encourage fluids. can start cranberry tablet daily. Add urine culture.     Vaginal itching       U/A showed 2+ leukocytes. Will treat for UTI and prevent yeast with diflucan 150mg  x1 after antibiotics.  Relevant Orders   POCT Urinalysis Dipstick (Automated) (Completed)   Urine Culture      Return in about 3 weeks (around 11/10/2023) for HTN.    Gerre Scull, NP

## 2023-10-20 NOTE — Assessment & Plan Note (Signed)
Chronic, stable.  She is euvolemic on exam today.  She can continue Lasix 20 mg daily as needed.

## 2023-10-20 NOTE — Assessment & Plan Note (Signed)
 BMI 45.7. Weight is stable since stopping ozempic after being unable to afford it. Discussed nutrition, limiting portion sizes and sweets. Will place referral to [facility].

## 2023-10-20 NOTE — Assessment & Plan Note (Signed)
 Chronic, stable. Her diabetes is well-controlled with previous A1c levels. She is not on medication due to cost and side effects and is not monitoring blood glucose at home. Discussed glipizide if future A1c levels are high. Encourage dietary modifications for glycemic control. Check labs next visit in 3 weeks.

## 2023-10-20 NOTE — Assessment & Plan Note (Signed)
 She declines statin. Check lipid panel next visit in 3 weeks.

## 2023-10-21 ENCOUNTER — Encounter (INDEPENDENT_AMBULATORY_CARE_PROVIDER_SITE_OTHER): Payer: Self-pay

## 2023-10-22 LAB — URINE CULTURE
MICRO NUMBER:: 16308647
SPECIMEN QUALITY:: ADEQUATE

## 2023-10-25 ENCOUNTER — Encounter: Payer: Self-pay | Admitting: Nurse Practitioner

## 2023-11-09 ENCOUNTER — Encounter: Payer: Self-pay | Admitting: Nurse Practitioner

## 2023-11-09 ENCOUNTER — Ambulatory Visit (INDEPENDENT_AMBULATORY_CARE_PROVIDER_SITE_OTHER): Admitting: Nurse Practitioner

## 2023-11-09 VITALS — BP 142/86 | HR 70 | Temp 96.8°F | Ht 62.0 in | Wt 251.0 lb

## 2023-11-09 DIAGNOSIS — R300 Dysuria: Secondary | ICD-10-CM | POA: Diagnosis not present

## 2023-11-09 DIAGNOSIS — R8290 Unspecified abnormal findings in urine: Secondary | ICD-10-CM | POA: Diagnosis not present

## 2023-11-09 DIAGNOSIS — E559 Vitamin D deficiency, unspecified: Secondary | ICD-10-CM | POA: Diagnosis not present

## 2023-11-09 DIAGNOSIS — E119 Type 2 diabetes mellitus without complications: Secondary | ICD-10-CM

## 2023-11-09 DIAGNOSIS — E7800 Pure hypercholesterolemia, unspecified: Secondary | ICD-10-CM | POA: Diagnosis not present

## 2023-11-09 DIAGNOSIS — R252 Cramp and spasm: Secondary | ICD-10-CM | POA: Insufficient documentation

## 2023-11-09 DIAGNOSIS — R051 Acute cough: Secondary | ICD-10-CM

## 2023-11-09 DIAGNOSIS — I10 Essential (primary) hypertension: Secondary | ICD-10-CM | POA: Diagnosis not present

## 2023-11-09 LAB — MICROALBUMIN / CREATININE URINE RATIO
Creatinine,U: 113.3 mg/dL
Microalb Creat Ratio: 72.7 mg/g — ABNORMAL HIGH (ref 0.0–30.0)
Microalb, Ur: 8.2 mg/dL — ABNORMAL HIGH (ref 0.0–1.9)

## 2023-11-09 LAB — COMPREHENSIVE METABOLIC PANEL WITH GFR
ALT: 14 U/L (ref 0–35)
AST: 11 U/L (ref 0–37)
Albumin: 4.1 g/dL (ref 3.5–5.2)
Alkaline Phosphatase: 80 U/L (ref 39–117)
BUN: 23 mg/dL (ref 6–23)
CO2: 31 meq/L (ref 19–32)
Calcium: 9.2 mg/dL (ref 8.4–10.5)
Chloride: 105 meq/L (ref 96–112)
Creatinine, Ser: 0.82 mg/dL (ref 0.40–1.20)
GFR: 68.18 mL/min
Glucose, Bld: 122 mg/dL — ABNORMAL HIGH (ref 70–99)
Potassium: 4.8 meq/L (ref 3.5–5.1)
Sodium: 143 meq/L (ref 135–145)
Total Bilirubin: 0.7 mg/dL (ref 0.2–1.2)
Total Protein: 6.7 g/dL (ref 6.0–8.3)

## 2023-11-09 LAB — POCT URINALYSIS DIPSTICK
Bilirubin, UA: NEGATIVE
Blood, UA: POSITIVE
Glucose, UA: NEGATIVE
Ketones, UA: NEGATIVE
Nitrite, UA: NEGATIVE
Protein, UA: POSITIVE — AB
Spec Grav, UA: 1.025 (ref 1.010–1.025)
Urobilinogen, UA: NEGATIVE U/dL — AB
pH, UA: 6 (ref 5.0–8.0)

## 2023-11-09 LAB — LIPID PANEL
Cholesterol: 204 mg/dL — ABNORMAL HIGH (ref 0–200)
HDL: 84.6 mg/dL (ref >39.00)
LDL Cholesterol: 104 mg/dL — ABNORMAL HIGH (ref 0–99)
NonHDL: 119.26
Total CHOL/HDL Ratio: 2
Triglycerides: 75 mg/dL (ref 0.0–149.0)
VLDL: 15 mg/dL (ref 0.0–40.0)

## 2023-11-09 LAB — CBC WITH DIFFERENTIAL/PLATELET
Basophils Absolute: 0.1 K/uL (ref 0.0–0.1)
Basophils Relative: 0.8 % (ref 0.0–3.0)
Eosinophils Absolute: 0.3 K/uL (ref 0.0–0.7)
Eosinophils Relative: 3.1 % (ref 0.0–5.0)
HCT: 37.2 % (ref 36.0–46.0)
Hemoglobin: 12.3 g/dL (ref 12.0–15.0)
Lymphocytes Relative: 21.2 % (ref 12.0–46.0)
Lymphs Abs: 1.9 K/uL (ref 0.7–4.0)
MCHC: 33 g/dL (ref 30.0–36.0)
MCV: 91.5 fl (ref 78.0–100.0)
Monocytes Absolute: 0.5 K/uL (ref 0.1–1.0)
Monocytes Relative: 5.6 % (ref 3.0–12.0)
Neutro Abs: 6.2 K/uL (ref 1.4–7.7)
Neutrophils Relative %: 69.3 % (ref 43.0–77.0)
Platelets: 349 K/uL (ref 150.0–400.0)
RBC: 4.07 Mil/uL (ref 3.87–5.11)
RDW: 13.6 % (ref 11.5–15.5)
WBC: 8.9 K/uL (ref 4.0–10.5)

## 2023-11-09 LAB — HEMOGLOBIN A1C: Hgb A1c MFr Bld: 6.6 % — ABNORMAL HIGH (ref 4.6–6.5)

## 2023-11-09 LAB — VITAMIN D 25 HYDROXY (VIT D DEFICIENCY, FRACTURES): VITD: 15.52 ng/mL — ABNORMAL LOW (ref 30.00–100.00)

## 2023-11-09 LAB — MAGNESIUM: Magnesium: 2 mg/dL (ref 1.5–2.5)

## 2023-11-09 MED ORDER — SULFAMETHOXAZOLE-TRIMETHOPRIM 800-160 MG PO TABS: 1.0000 | ORAL_TABLET | Freq: Two times a day (BID) | ORAL | 0 refills | Status: DC

## 2023-11-09 NOTE — Patient Instructions (Addendum)
 It was great to see you!  We are checking your labs today and will let you know the results via mychart/phone.   Start lisinopril  1 tablet daily   You can take clariitin every day as needed for allergies  Let's follow-up at your next visit, sooner if you have concerns.  If a referral was placed today, you will be contacted for an appointment. Please note that routine referrals can sometimes take up to 3-4 weeks to process. Please call our office if you haven't heard anything after this time frame.  Take care,  Rheba Cedar, NP

## 2023-11-09 NOTE — Assessment & Plan Note (Signed)
 She experiences cramps in legs, arms, and hands, especially at night, with some improvement from magnesium . There is a possible electrolyte imbalance. She can continue magnesium  supplement. Order lab tests to check potassium and magnesium  levels.

## 2023-11-09 NOTE — Assessment & Plan Note (Signed)
 Continue a daily over-the-counter vitamin D  supplement. Check vitamin D  levels today and adjust regimen based on results.

## 2023-11-09 NOTE — Assessment & Plan Note (Signed)
 Chronic, stable. Her diabetes is well-controlled with previous A1c levels. She is not on medication due to cost and side effects and is not monitoring blood glucose at home. Discussed glipizide if future A1c levels are high. Encourage dietary modifications for glycemic control. Check CMP, CBC, A1c, and urine microalbumin today.

## 2023-11-09 NOTE — Assessment & Plan Note (Signed)
 Chronic, not controlled. She was reassured that lisinopril  does not cause weight gain and encouraged her to start the lisinopril  5mg  daily in addition to diltiazem . Monitor blood pressure regularly and discuss any side effects or concerns with medication. Check CMP, CBC, today.

## 2023-11-09 NOTE — Assessment & Plan Note (Signed)
 She declines statin. Check CMP, CBC, lipid panel today

## 2023-11-09 NOTE — Progress Notes (Signed)
 Established Patient Office Visit  Subjective   Patient ID: [name], female    DOB: Dec 02, 1945  Age: 78 y.o. MRN: [id number]  Chief Complaint  Patient presents with   Hypertension    Follow up, patient is fasting    HPI Discussed the use of AI scribe software for clinical note transcription with the patient, who gave verbal consent to proceed.  History of Present Illness   [name] is a 78 year old female who presents with muscle cramps and constipation.  Muscle cramps occur primarily at night, starting in her legs and extending to her arms and hands. The cramps began shortly after her last visit and initially affected her hands during activities. Certain movements, such as positioning her foot in a specific way while sitting, can trigger the cramps. Magnesium  supplements, taken once daily, have reduced the severity of the cramps. She also read that magnesium  can help with constipation.   She did not end up starting the lisinopril  as she read a side effect can be significant weight gain. She has been working hard to lose weight and didn't want this to happen. She has not been checking her blood pressure at home. She denies chest pain, shortness of breath, and headaches.   Her urine has become clearer since starting the bactrim  for UTI, though she experiences a slight itchy, stinging sensation. She denies fever, abdominal pain.  A recent mild cough is attributed to pollen exposure, with no respiratory discomfort, chest pain, or shortness of breath.        ROS See pertinent positives and negatives per HPI.    Objective:     BP (!) 142/86 (BP Location: Left Arm, Cuff Size: Large)   Pulse 70   Temp (!) 96.8 F (36 C)   Ht 5\' 2"  (1.575 m)   Wt 251 lb (113.9 kg)   SpO2 99%   BMI 45.91 kg/m  BP Readings from Last 3 Encounters:  11/09/23 (!) 142/86  10/20/23 (!) 150/78  05/12/23 (!) 136/90   Wt Readings from Last 3 Encounters:  11/09/23 251 lb (113.9 kg)  10/20/23  250 lb (113.4 kg)  05/12/23 252 lb (114.3 kg)      Physical Exam Vitals and nursing note reviewed.  Constitutional:      General: She is not in acute distress.    Appearance: Normal appearance.  HENT:     Head: Normocephalic.  Eyes:     Conjunctiva/sclera: Conjunctivae normal.  Cardiovascular:     Rate and Rhythm: Normal rate and regular rhythm.     Pulses: Normal pulses.     Heart sounds: Normal heart sounds.  Pulmonary:     Effort: Pulmonary effort is normal.     Breath sounds: Normal breath sounds.  Musculoskeletal:     Cervical back: Normal range of motion.  Skin:    General: Skin is warm.  Neurological:     General: No focal deficit present.     Mental Status: She is alert and oriented to person, place, and time.  Psychiatric:        Mood and Affect: Mood normal.        Behavior: Behavior normal.        Thought Content: Thought content normal.        Judgment: Judgment normal.      Results for orders placed or performed in visit on 11/09/23  POCT urinalysis dipstick  Result Value Ref Range   Color, UA     Clarity,  UA     Glucose, UA Negative Negative   Bilirubin, UA Negative    Ketones, UA Negative    Spec Grav, UA 1.025 1.010 - 1.025   Blood, UA Positive    pH, UA 6.0 5.0 - 8.0   Protein, UA Positive (A) Negative   Urobilinogen, UA negative (A) 0.2 or 1.0 E.U./dL   Nitrite, UA Negative    Leukocytes, UA Large (3+) (A) Negative   Appearance     Odor        The 10-year ASCVD risk score (Arnett DK, et al., 2019) is: 61.6%    Assessment & Plan:   Problem List Items Addressed This Visit       Cardiovascular and Mediastinum   Essential hypertension - Primary   Chronic, not controlled. She was reassured that lisinopril  does not cause weight gain and encouraged her to start the lisinopril  5mg  daily in addition to diltiazem . Monitor blood pressure regularly and discuss any side effects or concerns with medication. Check CMP, CBC, today.          Endocrine   Type 2 diabetes mellitus without complications (HCC)   Chronic, stable. Her diabetes is well-controlled with previous A1c levels. She is not on medication due to cost and side effects and is not monitoring blood glucose at home. Discussed glipizide if future A1c levels are high. Encourage dietary modifications for glycemic control. Check CMP, CBC, A1c, and urine microalbumin today.       Relevant Orders   CBC with Differential/Platelet   Comprehensive metabolic panel with GFR   Hemoglobin A1c   Microalbumin / creatinine urine ratio     Other   HLD (hyperlipidemia)   She declines statin. Check CMP, CBC, lipid panel today      Relevant Orders   CBC with Differential/Platelet   Comprehensive metabolic panel with GFR   Lipid panel   Vitamin D  deficiency   Continue a daily over-the-counter vitamin D  supplement. Check vitamin D  levels today and adjust regimen based on results.       Relevant Orders   VITAMIN D  25 Hydroxy (Vit-D Deficiency, Fractures)   Muscle cramping   She experiences cramps in legs, arms, and hands, especially at night, with some improvement from magnesium . There is a possible electrolyte imbalance. She can continue magnesium  supplement. Order lab tests to check potassium and magnesium  levels.      Relevant Orders   Comprehensive metabolic panel with GFR   Magnesium    Other Visit Diagnoses       Dysuria       She finished bactrim  BID x7 days. U/A shows 3+ leukocytes. Add on culture and start another round of bactrim  BID x7 days.   Relevant Orders   POCT urinalysis dipstick (Completed)   Urine Culture     Acute cough       Most likely related to allergies. Start claritin  10mg  daily. Drink plenty of fluids.        Return if symptoms worsen or fail to improve.    Odette Benjamin, NP

## 2023-11-10 MED ORDER — VITAMIN D (ERGOCALCIFEROL) 1.25 MG (50000 UNIT) PO CAPS: 50000.0000 [IU] | ORAL_CAPSULE | ORAL | 0 refills | Status: DC

## 2023-11-10 NOTE — Addendum Note (Signed)
 Addended by: Pike Scantlebury A on: 11/10/2023 09:57 AM   Modules accepted: Orders

## 2023-11-12 LAB — URINE CULTURE
MICRO NUMBER:: 16389287
SPECIMEN QUALITY:: ADEQUATE

## 2023-11-18 ENCOUNTER — Encounter: Payer: Self-pay | Admitting: Nurse Practitioner

## 2023-11-29 ENCOUNTER — Encounter: Payer: Self-pay | Admitting: Nurse Practitioner

## 2024-02-09 ENCOUNTER — Ambulatory Visit: Payer: Self-pay

## 2024-02-09 NOTE — Telephone Encounter (Signed)
 FYI Only or Action Required?: FYI only for provider.  Patient was last seen in primary care on 11/09/2023 by Nedra Tinnie LABOR, NP.  Called Nurse Triage reporting Back Pain.  Symptoms began several days ago.  Interventions attempted: OTC medications: ibuprofen.  Symptoms are: gradually improving.  Triage Disposition: See Physician Within 24 Hours  Patient/caregiver understands and will follow disposition?: Yes        Copied from CRM #[id number]. Topic: Clinical - Red Word Triage >> Feb 09, 2024  4:24 PM [place] E wrote: [facility] that prompted transfer to Nurse Triage: Fever. History of kidney stones, fever of 100.2 last night, hot flashes, chills, and body weakness. Reason for Disposition  Fever present > 3 days (72 hours)  Answer Assessment - Initial Assessment Questions 1. TEMPERATURE: What is the most recent temperature?  How was it measured?      99 2. ONSET: When did the fever start?      2 night ago 3. CHILLS: Do you have chills? If yes: How bad are they?  (e.g., none, mild, moderate, severe)     mod 4. OTHER SYMPTOMS: Do you have any other symptoms besides the fever?  (e.g., abdomen pain, cough, diarrhea, earache, headache, sore throat, urination pain)     Had back pain 3 days ago 5. CAUSE: If there are no symptoms, ask: What do you think is causing the fever?      Kidney stones due to history 6. CONTACTS: Does anyone else in the family have an infection?     no 7. TREATMENT: What have you done so far to treat this fever? (e.g., OTC fever medicines)     ibuprofen 8. IMMUNOCOMPROMISE: Do you have any of the following: diabetes, HIV positive, splenectomy, cancer chemotherapy, chronic steroid treatment, transplant patient, etc.?     no  Protocols used: Fever-A-AH

## 2024-02-09 NOTE — Telephone Encounter (Signed)
 Noted. Patient scheduled for 02/10/24.

## 2024-02-10 ENCOUNTER — Other Ambulatory Visit (HOSPITAL_COMMUNITY): Payer: Self-pay

## 2024-02-10 ENCOUNTER — Encounter: Payer: Self-pay | Admitting: Nurse Practitioner

## 2024-02-10 ENCOUNTER — Ambulatory Visit: Payer: Self-pay | Admitting: Nurse Practitioner

## 2024-02-10 ENCOUNTER — Ambulatory Visit (INDEPENDENT_AMBULATORY_CARE_PROVIDER_SITE_OTHER): Admitting: Nurse Practitioner

## 2024-02-10 VITALS — BP 135/71 | HR 78 | Temp 98.6°F | Ht 62.0 in | Wt 255.0 lb

## 2024-02-10 DIAGNOSIS — N390 Urinary tract infection, site not specified: Secondary | ICD-10-CM

## 2024-02-10 DIAGNOSIS — R109 Unspecified abdominal pain: Secondary | ICD-10-CM | POA: Diagnosis not present

## 2024-02-10 DIAGNOSIS — R509 Fever, unspecified: Secondary | ICD-10-CM | POA: Diagnosis not present

## 2024-02-10 LAB — RENAL FUNCTION PANEL
Albumin: 3.8 g/dL (ref 3.5–5.2)
BUN: 15 mg/dL (ref 6–23)
CO2: 24 meq/L (ref 19–32)
Calcium: 8.8 mg/dL (ref 8.4–10.5)
Chloride: 98 meq/L (ref 96–112)
Creatinine, Ser: 0.98 mg/dL (ref 0.40–1.20)
GFR: 54.95 mL/min — ABNORMAL LOW (ref >60.00)
Glucose, Bld: 170 mg/dL — ABNORMAL HIGH (ref 70–99)
Phosphorus: 2.3 mg/dL (ref 2.3–4.6)
Potassium: 3.7 meq/L (ref 3.5–5.1)
Sodium: 132 meq/L — ABNORMAL LOW (ref 135–145)

## 2024-02-10 LAB — POCT URINALYSIS DIPSTICK
Bilirubin, UA: NEGATIVE
Blood, UA: 2
Glucose, UA: NEGATIVE
Ketones, UA: NEGATIVE
Nitrite, UA: NEGATIVE
Protein, UA: POSITIVE — AB
Spec Grav, UA: 1.015 (ref 1.010–1.025)
Urobilinogen, UA: 0.2 U/dL
pH, UA: 6 (ref 5.0–8.0)

## 2024-02-10 LAB — CBC WITH DIFFERENTIAL/PLATELET
Basophils Absolute: 0.1 K/uL (ref 0.0–0.1)
Basophils Relative: 0.8 % (ref 0.0–3.0)
Eosinophils Absolute: 0 K/uL (ref 0.0–0.7)
Eosinophils Relative: 0.4 % (ref 0.0–5.0)
HCT: 34.6 % — ABNORMAL LOW (ref 36.0–46.0)
Hemoglobin: 11.6 g/dL — ABNORMAL LOW (ref 12.0–15.0)
Lymphocytes Relative: 11.1 % — ABNORMAL LOW (ref 12.0–46.0)
Lymphs Abs: 1.5 K/uL (ref 0.7–4.0)
MCHC: 33.6 g/dL (ref 30.0–36.0)
MCV: 87.7 fl (ref 78.0–100.0)
Monocytes Absolute: 1.2 K/uL — ABNORMAL HIGH (ref 0.1–1.0)
Monocytes Relative: 9.1 % (ref 3.0–12.0)
Neutro Abs: 10.4 K/uL — ABNORMAL HIGH (ref 1.4–7.7)
Neutrophils Relative %: 78.6 % — ABNORMAL HIGH (ref 43.0–77.0)
Platelets: 341 K/uL (ref 150.0–400.0)
RBC: 3.94 Mil/uL (ref 3.87–5.11)
RDW: 12.7 % (ref 11.5–15.5)
WBC: 13.3 K/uL — ABNORMAL HIGH (ref 4.0–10.5)

## 2024-02-10 LAB — C-REACTIVE PROTEIN: CRP: 20.1 mg/dL — ABNORMAL HIGH (ref 0.5–20.0)

## 2024-02-10 MED ORDER — NITROFURANTOIN MONOHYD MACRO 100 MG PO CAPS
100.0000 mg | ORAL_CAPSULE | Freq: Two times a day (BID) | ORAL | 0 refills | Status: DC
  Filled 2024-02-10: qty 14, 7d supply, fill #0

## 2024-02-10 NOTE — Progress Notes (Signed)
 Acute Office Visit  Subjective:    Patient ID: [name], female    DOB: 1946-07-02, 78 y.o.   MRN: [id number]  Chief Complaint  Patient presents with   Back Pain    Lower back pain x 1 week. Vomit, fever and chills. Highest fevr 102.9   Recurrent UTI Onset of fever and left flank pain 1week ago Hx of recurrent UTI with positive E coli growth, Completed bactrim  course 3months ago. Under the care of [facility]. Last appointment 8months ago Hx of renal mass, needs repeat CT ABDOMEN Has constipation, no diarrhea, No blood in stool. No nausea.  Positive hematuria noted today: sent urine for culture, start macrobid  Check CBC, BMP and CRP May need inpatient eval and treatment if elevated WBC and/oral decline in renal function Provided ED precautions Advised to maintain adequate oral hydration Advised to schedule f/up appointment with urology    Outpatient Medications Prior to Visit  Medication Sig   diclofenac  sodium (VOLTAREN ) 1 % GEL Apply 2 g topically 3 (three) times daily as needed (back pain).    diltiazem  (CARDIZEM  CD) 120 MG 24 hr capsule Take 1 capsule (120 mg total) by mouth daily.   aspirin  EC 81 MG tablet Take 81 mg by mouth daily. Swallow whole. (Patient not taking: Reported on 02/10/2024)   gabapentin  (NEURONTIN ) 100 MG capsule Take 1 capsule (100 mg total) by mouth 3 (three) times daily as needed (leg pain, tingling). (Patient not taking: Reported on 02/10/2024)   ketoconazole  (NIZORAL ) 2 % cream Apply 1 Application topically daily. (Patient not taking: Reported on 02/10/2024)   lidocaine  4 % Place 1 patch onto the skin daily. (Patient not taking: Reported on 02/10/2024)   lisinopril  (ZESTRIL ) 5 MG tablet Take 1 tablet (5 mg total) by mouth daily. (Patient not taking: Reported on 02/10/2024)   sulfamethoxazole -trimethoprim  (BACTRIM  DS) 800-160 MG tablet Take 1 tablet by mouth 2 (two) times daily. (Patient not taking: Reported on 02/10/2024)   Vitamin D ,  Ergocalciferol , (DRISDOL ) 1.25 MG (50000 UNIT) CAPS capsule Take 1 capsule (50,000 Units total) by mouth every 7 (seven) days. (Patient not taking: Reported on 02/10/2024)   No facility-administered medications prior to visit.    Reviewed past medical and social history.  Review of Systems  Musculoskeletal:  Positive for back pain.   Per HPI     Objective:    Physical Exam Vitals and nursing note reviewed.  Constitutional:      General: She is not in acute distress. Cardiovascular:     Rate and Rhythm: Normal rate.     Pulses: Normal pulses.  Pulmonary:     Effort: Pulmonary effort is normal.  Abdominal:     General: There is no distension.     Palpations: Abdomen is soft.     Tenderness: There is no abdominal tenderness. There is no right CVA tenderness, left CVA tenderness or guarding.  Neurological:     Mental Status: She is alert and oriented to person, place, and time.    BP 135/71 (Cuff Size: Normal)   Pulse 78   Temp 98.6 F (37 C) (Temporal)   Ht 5' 2 (1.575 m)   Wt 255 lb (115.7 kg)   SpO2 96%   BMI 46.64 kg/m    Results for orders placed or performed in visit on 02/10/24  POCT urinalysis dipstick  Result Value Ref Range   Color, UA     Clarity, UA     Glucose, UA Negative Negative  Bilirubin, UA neg    Ketones, UA neg    Spec Grav, UA 1.015 1.010 - 1.025   Blood, UA 2    pH, UA 6.0 5.0 - 8.0   Protein, UA Positive (A) Negative   Urobilinogen, UA 0.2 0.2 or 1.0 E.U./dL   Nitrite, UA neg    Leukocytes, UA Large (3+) (A) Negative   Appearance     Odor        Assessment & Plan:   Problem List Items Addressed This Visit     Recurrent UTI - Primary   Onset of fever and left flank pain 1week ago Hx of recurrent UTI with positive E coli growth, Completed bactrim  course 3months ago. Under the care of [facility]. Last appointment 8months ago Hx of renal mass, needs repeat CT ABDOMEN Has constipation, no diarrhea, No blood in stool. No  nausea.  Positive hematuria noted today: sent urine for culture, start macrobid  Check CBC, BMP and CRP May need inpatient eval and treatment if elevated WBC and/oral decline in renal function Provided ED precautions Advised to maintain adequate oral hydration Advised to schedule f/up appointment with urology      Relevant Medications   nitrofurantoin , macrocrystal-monohydrate, (MACROBID ) 100 MG capsule   Other Relevant Orders   Urine Culture   POCT urinalysis dipstick (Completed)   CBC with Differential/Platelet   Renal Function Panel   C-reactive protein   Other Visit Diagnoses       Flank pain       Relevant Medications   nitrofurantoin , macrocrystal-monohydrate, (MACROBID ) 100 MG capsule   Other Relevant Orders   Urine Culture   POCT urinalysis dipstick (Completed)      Meds ordered this encounter  Medications   nitrofurantoin , macrocrystal-monohydrate, (MACROBID ) 100 MG capsule    Sig: Take 1 capsule (100 mg total) by mouth 2 (two) times daily for 7 days.    Dispense:  14 capsule    Refill:  0    Supervising Provider:   BERNETA ELSIE SAYRE [[id number]]   Return if symptoms worsen or fail to improve.  Roselie Mood, NP

## 2024-02-10 NOTE — Patient Instructions (Addendum)
 Maintain adequate oral hydration Schedule appointment with urology Go to lab Go to ED if no symptoms worsen

## 2024-02-10 NOTE — Assessment & Plan Note (Addendum)
 Onset of fever and left flank pain 1week ago Hx of recurrent UTI with positive E coli growth, Completed bactrim  course 3months ago. Under the care of [facility]. Last appointment 8months ago Hx of renal mass, needs repeat CT ABDOMEN Has constipation, no diarrhea, No blood in stool. No nausea.  Positive hematuria noted today: sent urine for culture, start macrobid  Check CBC, BMP and CRP May need inpatient eval and treatment if elevated WBC and/oral decline in renal function Provided ED precautions Advised to maintain adequate oral hydration Advised to schedule f/up appointment with urology

## 2024-02-11 ENCOUNTER — Telehealth: Payer: Self-pay | Admitting: Nurse Practitioner

## 2024-02-11 ENCOUNTER — Other Ambulatory Visit (HOSPITAL_COMMUNITY): Payer: Self-pay

## 2024-02-11 NOTE — Telephone Encounter (Signed)
 Copied from CRM [id number]. Topic: Clinical - Prescription Issue >> Feb 11, 2024  1:49 PM Gennette ORN wrote: Reason for CRM: Patient is calling about Vitamin D , Ergocalciferol , (DRISDOL ) 1.25 MG (50000 UNIT) CAPS capsule she has no more refills. Patient stated the dr told her to take 12 weeks. She wants a call back. Informed patient a callback will be made. [phone]

## 2024-02-12 LAB — URINE CULTURE
MICRO NUMBER:: 16770644
SPECIMEN QUALITY:: ADEQUATE

## 2024-02-14 ENCOUNTER — Other Ambulatory Visit (HOSPITAL_COMMUNITY): Payer: Self-pay

## 2024-02-14 NOTE — Telephone Encounter (Signed)
 I called and spoke with patient and notified her of below message and she will start taking vitamin D  daily.

## 2024-02-14 NOTE — Telephone Encounter (Signed)
 I called to verify and spoke with pharmacy tech at [facility] and Rx was picked up of Vitamin D  of qty 12 tablets.  Do you want her to start an over the counter vitamin d ?

## 2024-02-18 ENCOUNTER — Ambulatory Visit: Payer: Self-pay | Admitting: Nurse Practitioner

## 2024-02-18 ENCOUNTER — Other Ambulatory Visit (HOSPITAL_COMMUNITY): Payer: Self-pay

## 2024-02-18 ENCOUNTER — Encounter: Payer: Self-pay | Admitting: Nurse Practitioner

## 2024-02-18 ENCOUNTER — Ambulatory Visit (INDEPENDENT_AMBULATORY_CARE_PROVIDER_SITE_OTHER): Admitting: Nurse Practitioner

## 2024-02-18 VITALS — BP 126/74 | HR 93 | Temp 97.5°F | Ht 62.0 in | Wt 252.2 lb

## 2024-02-18 DIAGNOSIS — R5383 Other fatigue: Secondary | ICD-10-CM | POA: Diagnosis not present

## 2024-02-18 DIAGNOSIS — D649 Anemia, unspecified: Secondary | ICD-10-CM

## 2024-02-18 DIAGNOSIS — E559 Vitamin D deficiency, unspecified: Secondary | ICD-10-CM

## 2024-02-18 DIAGNOSIS — R0602 Shortness of breath: Secondary | ICD-10-CM

## 2024-02-18 DIAGNOSIS — R8290 Unspecified abnormal findings in urine: Secondary | ICD-10-CM

## 2024-02-18 LAB — BASIC METABOLIC PANEL WITH GFR
BUN: 20 mg/dL (ref 6–23)
CO2: 28 meq/L (ref 19–32)
Calcium: 9.1 mg/dL (ref 8.4–10.5)
Chloride: 100 meq/L (ref 96–112)
Creatinine, Ser: 0.87 mg/dL (ref 0.40–1.20)
GFR: 63.38 mL/min (ref >60.00)
Glucose, Bld: 101 mg/dL — ABNORMAL HIGH (ref 70–99)
Potassium: 4.4 meq/L (ref 3.5–5.1)
Sodium: 141 meq/L (ref 135–145)

## 2024-02-18 LAB — CBC WITH DIFFERENTIAL/PLATELET
Basophils Absolute: 0.1 K/uL (ref 0.0–0.1)
Basophils Relative: 1 % (ref 0.0–3.0)
Eosinophils Absolute: 0.2 K/uL (ref 0.0–0.7)
Eosinophils Relative: 2.4 % (ref 0.0–5.0)
HCT: 38.9 % (ref 36.0–46.0)
Hemoglobin: 13 g/dL (ref 12.0–15.0)
Lymphocytes Relative: 22 % (ref 12.0–46.0)
Lymphs Abs: 2.3 K/uL (ref 0.7–4.0)
MCHC: 33.5 g/dL (ref 30.0–36.0)
MCV: 89.1 fl (ref 78.0–100.0)
Monocytes Absolute: 0.5 K/uL (ref 0.1–1.0)
Monocytes Relative: 4.7 % (ref 3.0–12.0)
Neutro Abs: 7.3 K/uL (ref 1.4–7.7)
Neutrophils Relative %: 69.9 % (ref 43.0–77.0)
Platelets: 531 K/uL — ABNORMAL HIGH (ref 150.0–400.0)
RBC: 4.37 Mil/uL (ref 3.87–5.11)
RDW: 12.7 % (ref 11.5–15.5)
WBC: 10.5 K/uL (ref 4.0–10.5)

## 2024-02-18 LAB — TSH: TSH: 1.46 u[IU]/mL (ref 0.35–5.50)

## 2024-02-18 LAB — FERRITIN: Ferritin: 145.5 ng/mL (ref 10.0–291.0)

## 2024-02-18 LAB — VITAMIN D 25 HYDROXY (VIT D DEFICIENCY, FRACTURES): VITD: 28.18 ng/mL — ABNORMAL LOW (ref 30.00–100.00)

## 2024-02-18 LAB — BRAIN NATRIURETIC PEPTIDE: Pro B Natriuretic peptide (BNP): 67 pg/mL (ref 0.0–100.0)

## 2024-02-18 LAB — VITAMIN B12: Vitamin B-12: 410 pg/mL (ref 211–911)

## 2024-02-18 LAB — IRON: Iron: 75 ug/dL (ref 42–145)

## 2024-02-18 MED ORDER — FLUCONAZOLE 150 MG PO TABS
150.0000 mg | ORAL_TABLET | Freq: Once | ORAL | 0 refills | Status: AC
  Filled 2024-02-18 – 2024-03-03 (×3): qty 1, 1d supply, fill #0

## 2024-02-18 NOTE — Progress Notes (Signed)
 EKG interpreted by me on 02/18/24 showed normal sinus rhythm with first degree heart block, heart rate 70. Similar to prior EKG.

## 2024-02-18 NOTE — Assessment & Plan Note (Signed)
 Check vitamin d  levels and treat based on results.

## 2024-02-18 NOTE — Addendum Note (Signed)
 Addended by: Amyrah Pinkhasov A on: 02/18/2024 12:51 PM   Modules accepted: Level of Service

## 2024-02-18 NOTE — Assessment & Plan Note (Signed)
 Check CBC, iron , ferritin, b12 today and treat based on results.

## 2024-02-18 NOTE — Assessment & Plan Note (Addendum)
 Shortness of breath has been ongoing since UTI, especially with exertion. Discussed fluid balance for kidney and heart health. EKG shows normal sinus rhythm with first degree heart block, heart rate 70. Similar to prior EKG. Start furosemide  20 mg once daily for three days. Advise daily banana intake for potassium. Check BNP today. Follow-up in 2 weeks.

## 2024-02-18 NOTE — Progress Notes (Signed)
 Established Patient Office Visit  Subjective   Patient ID: [name], female    DOB: [birth year]  Age: 78 y.o. MRN: [id number]  Chief Complaint  Patient presents with   Fatigue    Feels very weak, SOB when walking with some dizziness    HPI Discussed the use of AI scribe software for clinical note transcription with the patient, who gave verbal consent to proceed.  History of Present Illness   [name] is a 78 year old female who presents with persistent weakness following a recent urinary tract infection.  She experienced a sudden onset of chills and fever up to 102.76F, lasting three days, with significant weakness and decreased appetite. She completed antibiotics for a urinary tract infection today, with no current urinary symptoms. Her urine is now clearer.  She continues to feel weak and experiences shortness of breath. Shortness of breath occurs when walking short distances, worsening over the past two weeks. Leg swelling has decreased recently.  She has been drinking more fluids, including zero sugar vitamin water, and has been mostly sedentary, walking about 400 steps per day over the past 12 days.        ROS See pertinent positives and negatives per HPI.    Objective:     BP 126/74 (BP Location: Left Arm, Patient Position: Sitting, Cuff Size: Large)   Pulse 93   Temp (!) 97.5 F (36.4 C)   Ht 5' 2 (1.575 m)   Wt 252 lb 3.2 oz (114.4 kg)   SpO2 97%   BMI 46.13 kg/m  BP Readings from Last 3 Encounters:  02/18/24 126/74  02/10/24 135/71  11/09/23 (!) 142/86   Wt Readings from Last 3 Encounters:  02/18/24 252 lb 3.2 oz (114.4 kg)  02/10/24 255 lb (115.7 kg)  11/09/23 251 lb (113.9 kg)      Physical Exam Vitals and nursing note reviewed.  Constitutional:      General: She is not in acute distress.    Appearance: Normal appearance.  HENT:     Head: Normocephalic.  Eyes:     Conjunctiva/sclera: Conjunctivae normal.   Cardiovascular:     Rate and Rhythm: Normal rate and regular rhythm.     Pulses: Normal pulses.     Heart sounds: Normal heart sounds.  Pulmonary:     Effort: Pulmonary effort is normal.     Breath sounds: Normal breath sounds.     Comments: Short of breath walking from lobby to exam room, needed to stop and rest part way through Musculoskeletal:     Cervical back: Normal range of motion.     Right lower leg: Edema (trace) present.     Left lower leg: Edema (trace) present.     Comments: Strength 4/5 bilaterally in upper and lower extremities  Skin:    General: Skin is warm.  Neurological:     General: No focal deficit present.     Mental Status: She is alert and oriented to person, place, and time.  Psychiatric:        Mood and Affect: Mood normal.        Behavior: Behavior normal.        Thought Content: Thought content normal.        Judgment: Judgment normal.    The 10-year ASCVD risk score (Arnett DK, et al., 2019) is: 53%    Assessment & Plan:   Problem List Items Addressed This Visit       Other  Shortness of breath   Shortness of breath has been ongoing since UTI, especially with exertion. Discussed fluid balance for kidney and heart health. EKG shows normal sinus rhythm with first degree heart block, heart rate 70. Similar to prior EKG. Start furosemide  20 mg once daily for three days. Advise daily banana intake for potassium. Check BNP today. Follow-up in 2 weeks.       Relevant Orders   CBC with Differential/Platelet   Basic metabolic panel with GFR   TSH   B Nat Peptide   EKG 12-Lead (Completed)   Anemia   Check CBC, iron , ferritin, b12 today and treat based on results.       Relevant Orders   CBC with Differential/Platelet   Vitamin B12   Iron    Ferritin   Vitamin D  deficiency   Check vitamin d  levels and treat based on results.       Relevant Orders   VITAMIN D  25 Hydroxy (Vit-D Deficiency, Fractures)   Fatigue - Primary   Ongoing fatigue  for 2 weeks since recent UTI. Discussed this can be expected as it sounds like she may have had pylenophritis as well. Continue rest and increase activity slowly. Check BMP, CBC, TSH, vitamin D  today      Relevant Orders   CBC with Differential/Platelet   Basic metabolic panel with GFR   TSH   Other Visit Diagnoses       Cloudy urine       Recently treated for UTI. Will check urine culture to ensure resolution with cloudy urine   Relevant Orders   Urine Culture      Return in about 2 weeks (around 03/03/2024) for weakness, fatigue.    Tinnie DELENA Harada, NP

## 2024-02-18 NOTE — Assessment & Plan Note (Signed)
 Ongoing fatigue for 2 weeks since recent UTI. Discussed this can be expected as it sounds like she may have had pylenophritis as well. Continue rest and increase activity slowly. Check BMP, CBC, TSH, vitamin D  today

## 2024-02-18 NOTE — Patient Instructions (Signed)
 It was great to see you!  Take diflucan  once   Start a fluid pill once a day for 3 days  Eat a banana with it   We are checking your labs today and will let you know the results via mychart/phone.   Let's follow-up in 2 weeks, sooner if you have concerns.  If a referral was placed today, you will be contacted for an appointment. Please note that routine referrals can sometimes take up to 3-4 weeks to process. Please call our office if you haven't heard anything after this time frame.  Take care,  Tinnie Harada, NP

## 2024-02-19 LAB — URINE CULTURE
MICRO NUMBER:: 16806421
SPECIMEN QUALITY:: ADEQUATE

## 2024-02-21 MED ORDER — VITAMIN D (ERGOCALCIFEROL) 1.25 MG (50000 UNIT) PO CAPS: 50000.0000 [IU] | ORAL_CAPSULE | ORAL | 0 refills | Status: DC

## 2024-02-25 DIAGNOSIS — M25572 Pain in left ankle and joints of left foot: Secondary | ICD-10-CM | POA: Diagnosis not present

## 2024-02-25 DIAGNOSIS — M79671 Pain in right foot: Secondary | ICD-10-CM | POA: Diagnosis not present

## 2024-02-25 DIAGNOSIS — M79672 Pain in left foot: Secondary | ICD-10-CM | POA: Diagnosis not present

## 2024-02-29 ENCOUNTER — Other Ambulatory Visit (HOSPITAL_COMMUNITY): Payer: Self-pay

## 2024-03-03 ENCOUNTER — Other Ambulatory Visit (HOSPITAL_COMMUNITY): Payer: Self-pay

## 2024-03-03 ENCOUNTER — Ambulatory Visit (INDEPENDENT_AMBULATORY_CARE_PROVIDER_SITE_OTHER): Admitting: Nurse Practitioner

## 2024-03-03 ENCOUNTER — Encounter: Payer: Self-pay | Admitting: Nurse Practitioner

## 2024-03-03 VITALS — BP 132/84 | HR 92 | Temp 97.2°F | Ht 62.0 in | Wt 254.8 lb

## 2024-03-03 DIAGNOSIS — I5032 Chronic diastolic (congestive) heart failure: Secondary | ICD-10-CM | POA: Diagnosis not present

## 2024-03-03 DIAGNOSIS — N898 Other specified noninflammatory disorders of vagina: Secondary | ICD-10-CM | POA: Diagnosis not present

## 2024-03-03 DIAGNOSIS — N200 Calculus of kidney: Secondary | ICD-10-CM | POA: Diagnosis not present

## 2024-03-03 MED ORDER — POTASSIUM CHLORIDE CRYS ER 10 MEQ PO TBCR
10.0000 meq | EXTENDED_RELEASE_TABLET | Freq: Every day | ORAL | 0 refills | Status: DC
Start: 2024-03-03 — End: 2024-03-09
  Filled 2024-03-03: qty 3, 3d supply, fill #0

## 2024-03-03 MED ORDER — TERCONAZOLE 0.4 % VA CREA
1.0000 | TOPICAL_CREAM | Freq: Every day | VAGINAL | 0 refills | Status: AC
  Filled 2024-03-03: qty 45, 7d supply, fill #0

## 2024-03-03 MED ORDER — FUROSEMIDE 20 MG PO TABS
20.0000 mg | ORAL_TABLET | Freq: Every day | ORAL | 0 refills | Status: DC
  Filled 2024-03-03: qty 3, 3d supply, fill #0

## 2024-03-03 NOTE — Patient Instructions (Addendum)
 It was great to see you!  Keep drinking plenty of water  I have sent in some cream for you to use in your vaginal area in addition to the tablet   Start lasix  1 tablet daily for 3 days   Take 1 tablet potassium with it for 3 days  Let's follow-up with any concerns   Take care,  Tinnie Harada, NP

## 2024-03-03 NOTE — Assessment & Plan Note (Signed)
 Chronic, improving. Her shortness of breath and leg swelling has improved, but still does have slightly. Will have her take lasix  20mg  daily x3 days and potassium 10meq daily x3 days. Continue weighing daily. Follow-up if shortness of breath worsens. She states that she feels close to her baseline.

## 2024-03-03 NOTE — Progress Notes (Signed)
 Established Patient Office Visit  Subjective   Patient ID: [name], female    DOB: [birth year]  Age: 78 y.o. MRN: [id number]  Chief Complaint  Patient presents with   Fatigue    Follow up, concerns with vaginal itching, Rx Refills    HPI  [name] is here to follow-up on fatigue, weakness, and shortness of breath.  She states that over the past 2 weeks, her energy has improved and she is feeling better. She was able to get her hair done yesterday. She notes that her urine has cleared up.   She is still having some shortness of breath with walking. The lasix  has helped some of the swelling in her legs. She denies chest pain and dizziness.   She is having left lower back pain that gets worse with laying down. She has been trying to reach out to her urologist at [facility] since she has the history of kidney stones, but cannot get an appointment. She is wondering if there are any other locations who she can see.     ROS See pertinent positives and negatives per HPI.    Objective:     BP 132/84 (BP Location: Left Arm, Patient Position: Sitting, Cuff Size: Large)   Pulse 92   Temp (!) 97.2 F (36.2 C)   Ht 5' 2 (1.575 m)   Wt 254 lb 12.8 oz (115.6 kg)   SpO2 98%   BMI 46.60 kg/m    Physical Exam Vitals and nursing note reviewed.  Constitutional:      General: She is not in acute distress.    Appearance: Normal appearance.  HENT:     Head: Normocephalic.  Eyes:     Conjunctiva/sclera: Conjunctivae normal.  Cardiovascular:     Rate and Rhythm: Normal rate and regular rhythm.     Pulses: Normal pulses.     Heart sounds: Normal heart sounds.  Pulmonary:     Effort: Pulmonary effort is normal.     Breath sounds: Normal breath sounds.  Musculoskeletal:     Cervical back: Normal range of motion.     Right lower leg: Edema (1+ pitting) present.     Left lower leg: Edema (1+ pitting) present.  Skin:    General: Skin is warm.  Neurological:      General: No focal deficit present.     Mental Status: She is alert and oriented to person, place, and time.  Psychiatric:        Mood and Affect: Mood normal.        Behavior: Behavior normal.        Thought Content: Thought content normal.        Judgment: Judgment normal.    The 10-year ASCVD risk score (Arnett DK, et al., 2019) is: 56.3%    Assessment & Plan:   Problem List Items Addressed This Visit       Cardiovascular and Mediastinum   Chronic diastolic heart failure (HCC)   Chronic, improving. Her shortness of breath and leg swelling has improved, but still does have slightly. Will have her take lasix  20mg  daily x3 days and potassium 10meq daily x3 days. Continue weighing daily. Follow-up if shortness of breath worsens. She states that she feels close to her baseline.       Relevant Medications   furosemide  (LASIX ) 20 MG tablet     Genitourinary   Renal calculus, left - Primary (Chronic)   History of kidney stones and is having  a hard time getting into her urologist at [facility]. Will place referral to [facility].       Relevant Orders   Ambulatory referral to Urology   Other Visit Diagnoses       Vaginal itching       She did not receive the diflucan  yet, but did call her pharmacy. Will also have her start terconazole  daily as needed.       Return if symptoms worsen or fail to improve.    Emily DELENA Harada, NP

## 2024-03-03 NOTE — Assessment & Plan Note (Signed)
 History of kidney stones and is having a hard time getting into her urologist at [facility]. Will place referral to [facility].

## 2024-03-07 ENCOUNTER — Telehealth: Payer: Self-pay

## 2024-03-07 NOTE — Progress Notes (Signed)
   03/07/2024  Patient ID: [name], female   DOB: Dec 07, 1945, 78 y.o.   MRN: [id number]  This patient is appearing on a report for being at risk of failing the adherence measure for hypertension (ACEi/ARB) medications this calendar year.   Medication: lisinopril  5mg  Last fill date: 4/9 for 90 day supply  MyChart message sent to patient.  [name], PharmD, DPLA

## 2024-03-09 ENCOUNTER — Ambulatory Visit (INDEPENDENT_AMBULATORY_CARE_PROVIDER_SITE_OTHER): Admitting: Urology

## 2024-03-09 ENCOUNTER — Encounter: Payer: Self-pay | Admitting: Urology

## 2024-03-09 VITALS — BP 134/84 | HR 105 | Ht 62.0 in | Wt 250.0 lb

## 2024-03-09 DIAGNOSIS — R8290 Unspecified abnormal findings in urine: Secondary | ICD-10-CM

## 2024-03-09 DIAGNOSIS — N200 Calculus of kidney: Secondary | ICD-10-CM

## 2024-03-09 DIAGNOSIS — Z87440 Personal history of urinary (tract) infections: Secondary | ICD-10-CM | POA: Diagnosis not present

## 2024-03-09 DIAGNOSIS — R109 Unspecified abdominal pain: Secondary | ICD-10-CM | POA: Diagnosis not present

## 2024-03-09 LAB — URINALYSIS, ROUTINE W REFLEX MICROSCOPIC
Bilirubin, UA: NEGATIVE
Glucose, UA: NEGATIVE
Ketones, UA: NEGATIVE
Nitrite, UA: POSITIVE — AB
Specific Gravity, UA: 1.02 (ref 1.005–1.030)
Urobilinogen, Ur: 1 mg/dL (ref 0.2–1.0)
pH, UA: 6 (ref 5.0–7.5)

## 2024-03-09 LAB — MICROSCOPIC EXAMINATION
RBC, Urine: >30 /HPF — AB (ref 0–2)
WBC, UA: >30 /HPF — AB (ref 0–5)

## 2024-03-09 NOTE — Progress Notes (Signed)
 Assessment: 1. Left flank pain   2. Nephrolithiasis   3. History of UTI   4. Abnormal urine findings     Plan: I personally reviewed the patient's chart including provider notes, lab and imaging results. I personally reviewed her records from [facility]. Recommend further evaluation of her left-sided flank/back pain and history of nephrolithiasis with CT renal stone study. Will contact her with results of CT scan. Urine culture sent today.  Will contact with results and recommendations for any treatment. Return to office in 4-6 weeks.  Chief Complaint:  Chief Complaint  Patient presents with   Nephrolithiasis    History of Present Illness:  ENEDELIA MARTORELLI is a 78 y.o. female who is seen in consultation from [facility], NP for evaluation of nephrolithiasis and recurrent UTIs. She has a history of nephrolithiasis.  She has a history of medullary sponge kidney with multiple stones.  She has been treated with lithotripsy as well as ureteroscopy.  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  Prior evaluation with 24-hour urine showed no significant abnormalities.  Stone analysis showed calcium  oxalate and calcium  phosphate.  She underwent ureteroscopy for a large UPJ stone on the right in March 2023.  She subsequently developed a large right-sided hematoma.  She has had recurrent issues with UTIs since that time.  She has been treated for multiple UTIs.  She has also been on suppressive antibiotics with both Macrodantin  and trimethoprim . She underwent evaluation with a MRI in January 2020 for which showed almost complete resolution of the right perinephric hematoma.  She was previously followed at [facility] and was last seen in July 2024. KUB from 7/24 showed no obvious calcifications in the area of the right renal shadow; multiple calcifications overlying the left renal shadow; no obvious ureteral calcifications. Renal ultrasound  showed no hydronephrosis, focal area of cortical thickening in the right kidney consistent with prior hematoma. No recent imaging studies.  Urine culture results: 05-Dec-2023 >100 K E. Coli 5/25 >100 K E. Coli 8/25 >100 K E. coli  She presents with a 2-week history of some left-sided flank and back pain.  Her pain is intermittent with increased symptoms at night.  She does get relief by taking ibuprofen.  She has relief of her symptoms with changing position.  No nausea or vomiting.  No gross hematuria.  She also noted some right sided flank pain yesterday.  She is not having any UTI symptoms at the present time.  She was last treated for a UTI in early August 2025.  Past Medical History:  Past Medical History:  Diagnosis Date   Acute hypoxemic respiratory failure (HCC) 10/24/2016   Arthritis    both knees   Carpal tunnel syndrome, bilateral    both hands go to sleep at times from elbows down   Chronic diastolic heart failure (HCC)    DDD (degenerative disc disease), lumbosacral    Diabetes mellitus without complication (HCC)    Dyspnea    during chronic bronchitis, resolved at this time   First degree AV block    Hematuria 11/15/2017   History of kidney stones    History of rib fracture 03/2011   Lumbar stenosis    Obesity    Osteoporosis    Postlaminectomy syndrome    Pre-diabetes    Renal calculi    BILATERAL   Right ureteral stone    Rotator cuff tear, left    Rotator  cuff tear, right    UTI (urinary tract infection)     Past Surgical History:  Past Surgical History:  Procedure Laterality Date   ANTERIOR CERVICAL DECOMP/DISCECTOMY FUSION  03-18-2011   C3 -- C7   ANTERIOR LATERAL LUMBAR FUSION 4 LEVELS N/A 05/06/2018   Procedure: Lumbar one- two Lumbar two -three Lumbar  three-four Lumbar four-five Anterolateral decompression/fusion/posterior bilateral laminectomy Lumbar four-five Lumbar five Sacral one/posterior percutaneous fixation/mazor;  Surgeon: Colon Shove, MD;   Location: [facility];  Service: Neurosurgery;  Laterality: N/A;   APPENDECTOMY     APPLICATION OF ROBOTIC ASSISTANCE FOR SPINAL PROCEDURE N/A 05/06/2018   Procedure: APPLICATION OF ROBOTIC ASSISTANCE FOR SPINAL PROCEDURE;  Surgeon: Colon Shove, MD;  Location: [facility];  Service: Neurosurgery;  Laterality: N/A;   BILATERAL CARPAL TUNNEL RELEASE     3 screws in right hand, thumb, middle finger, and index finger,    CARDIAC CATHETERIZATION N/A 01/16/2015   Procedure: Left Heart Cath and Coronary Angiography;  Surgeon: Shove LELON Sharps, MD;  Location: [facility] INVASIVE CV LAB;  Service: Cardiovascular;  Laterality: N/A;   CHOLECYSTECTOMY OPEN  1970'S   COLONOSCOPY  2018   CYSTOSCOPY WITH RETROGRADE PYELOGRAM, URETEROSCOPY AND STENT PLACEMENT N/A 02/13/2013   Procedure: CYSTOSCOPY WITH RETROGRADE PYELOGRAM, URETEROSCOPY AND LITHOTRIPSY ;  Surgeon: Mark C Ottelin, MD;  Location: [facility];  Service: Urology;  Laterality: N/A;   CYSTOSCOPY WITH URETEROSCOPY AND STENT PLACEMENT Left 04/17/2019   Procedure: CYSTOSCOPY WITH LEFT  RETROGRADE PYELOGRAM, LEFT URETEROSCOPY, HOLMIUM LASER AND LEFT STENT PLACEMENT;  Surgeon: Ottelin, Mark, MD;  Location: [facility];  Service: Urology;  Laterality: Left;   CYSTOSCOPY/URETEROSCOPY/HOLMIUM LASER/STENT PLACEMENT Right 11/22/2017   Procedure: CYSTOSCOPY/URETEROSCOPY/RETROGRADE PYELOGRAM/HOLMIUM LASER/STENT PLACEMENT;  Surgeon: Ottelin, Mark, MD;  Location: [facility];  Service: Urology;  Laterality: Right;   EXTRACORPOREAL SHOCK WAVE LITHOTRIPSY Right 05-16-2012   FINGER ARTHROSCOPY WITH CARPOMETACARPEL [facility]) ARTHROPLASTY     HOLMIUM LASER APPLICATION Right 02/13/2013   Procedure: HOLMIUM LASER APPLICATION;  Surgeon: Oneil JAYSON Rafter, MD;  Location: [facility];  Service: Urology;  Laterality: Right;   IR URETERAL STENT LEFT NEW ACCESS W/O SEP NEPHROSTOMY CATH  02/13/2019   KNEE ARTHROSCOPY Bilateral    LUMBAR LAMINECTOMY  X2  1960'S   LUMBAR  PERCUTANEOUS PEDICLE SCREW 4 LEVEL N/A 05/06/2018   Procedure: LUMBAR PERCUTANEOUS PEDICLE SCREW LUMBAR ONE TO LUMBAR FIVE WITH MAZOR ;POSTERIOR LATERAL LAMINECTOMY LUMBAR FOUR-FIVE,FIVE-SACRAL ONE;  Surgeon: Colon Shove, MD;  Location: [facility];  Service: Neurosurgery;  Laterality: N/A;   NASAL SEPTUM SURGERY  1970'S   NEPHROLITHOTOMY Left 02/20/2019   Procedure: NEPHROLITHOTOMY PERCUTANEOUS;  Surgeon: Ottelin, Mark, MD;  Location: [facility];  Service: Urology;  Laterality: Left;   SHOULDER OPEN ROTATOR CUFF REPAIR Bilateral LEFT 01-11-2001/   RIGHT 07-21-2002   TOTAL KNEE ARTHROPLASTY  04/13/2012   Procedure: TOTAL KNEE ARTHROPLASTY;  Surgeon: Dempsey LULLA Moan, MD;  Location: [facility];  Service: Orthopedics;  Laterality: Left;   TOTAL KNEE ARTHROPLASTY  08/08/2012   Procedure: TOTAL KNEE ARTHROPLASTY;  Surgeon: Dempsey LULLA Moan, MD;  Location: [facility];  Service: Orthopedics;  Laterality: Right;   TRANSTHORACIC ECHOCARDIOGRAM  03-29-2011   MODERATE LVH/ EF 60-65%/ MILD MR   URETEROSCOPY WITH HOLMIUM LASER LITHOTRIPSY Left 06/19/2019   Procedure: URETEROSCOPY WITH HOLMIUM LASER LITHOTRIPSY/INSERTION OF DOUBLE J STENT;  Surgeon: Ottelin, Mark, MD;  Location: [facility];  Service: Urology;  Laterality: Left;   VAGINAL HYSTERECTOMY  1960'S    Allergies:  Allergies  Allergen Reactions   Contrast Media [Iodinated Contrast Media] Hives and Rash    Omnipaque .  Rash, itching   Cipro  [Ciprofloxacin  Hcl] Nausea And Vomiting    Pt reports she thinks she is allergic and reports its caused her nausea, anxious, and weakness. Pt unsure.   Doxycycline Nausea And Vomiting and Other (See Comments)   Gabapentin  Other (See Comments)    Too drowsy   Metformin  Hcl Diarrhea and Other (See Comments)   Pregabalin Other (See Comments)    Too drowsy   Semaglutide (0.25 Or 0.5mg -Dos) Other (See Comments)    Stomach upset   Ancef  [Cefazolin ] Rash    Rash to ancef  vs omniopaue contrast   Augmentin  [Amoxicillin -Pot Clavulanate]  Itching, Nausea And Vomiting and Rash    Did it involve swelling of the face/tongue/throat, SOB, or low BP? Unknown Did it involve sudden or severe rash/hives, skin peeling, or any reaction on the inside of your mouth or nose? Yes Did you need to seek medical attention at a hospital or doctor's office? No When did it last happen? 2020      If all above answers are "NO", may proceed with cephalosporin use.    Sulfa  Antibiotics Itching, Nausea And Vomiting, Rash and Other (See Comments)   Tape Swelling    Adhesive Tape    Family History:  Family History  Problem Relation Age of Onset   Lung cancer Mother    Diabetes type II Mother    Lung cancer Father    Diabetes Sister    Hypertension Sister    Hypertension Brother    Diabetes Brother    Heart disease Brother    Diabetes Brother    Hypertension Brother    Heart disease Brother     Social History:  Social History   Tobacco Use   Smoking status: Never   Smokeless tobacco: Never  Vaping Use   Vaping status: Never Used  Substance Use Topics   Alcohol use: No   Drug use: No    Review of symptoms:  Constitutional:  Negative for unexplained weight loss, night sweats, fever, chills ENT:  Negative for nose bleeds, sinus pain, painful swallowing CV:  Negative for chest pain, shortness of breath, exercise intolerance, palpitations, loss of consciousness Resp:  Negative for cough, wheezing, shortness of breath GI:  Negative for nausea, vomiting, diarrhea, bloody stools GU:  Positives noted in HPI; otherwise negative for gross hematuria, dysuria, urinary incontinence Neuro:  Negative for seizures, poor balance, limb weakness, slurred speech Psych:  Negative for lack of energy, depression, anxiety Endocrine:  Negative for polydipsia, polyuria, symptoms of hypoglycemia (dizziness, hunger, sweating) Hematologic:  Negative for anemia, purpura, petechia, prolonged or excessive bleeding, use of anticoagulants  Allergic:  Negative for  difficulty breathing or choking as a result of exposure to anything; no shellfish allergy; no allergic response (rash/itch) to materials, foods  Physical exam: BP 134/84   Pulse (!) 105   Ht 5' 2 (1.575 m)   Wt 250 lb (113.4 kg)   BMI 45.73 kg/m  GENERAL APPEARANCE:  Well appearing, well developed, well nourished, NAD HEENT: Atraumatic, Normocephalic, oropharynx clear. NECK: Supple without lymphadenopathy or thyromegaly. LUNGS: Clear to auscultation bilaterally. HEART: Regular Rate and Rhythm without murmurs, gallops, or rubs. ABDOMEN: Soft, non-tender, No Masses. EXTREMITIES: Moves all extremities well.  Without clubbing, cyanosis, or edema. NEUROLOGIC:  Alert and oriented x 3, normal gait, CN II-XII grossly intact.  MENTAL STATUS:  Appropriate. BACK:  Non-tender to palpation.  No CVAT SKIN:  Warm, dry and intact.    Results: U/A: >30 WBCs, >30 RBCs, moderate bacteria, nitrite positive

## 2024-03-10 ENCOUNTER — Ambulatory Visit (HOSPITAL_BASED_OUTPATIENT_CLINIC_OR_DEPARTMENT_OTHER)
Admission: RE | Admit: 2024-03-10 | Discharge: 2024-03-10 | Disposition: A | Discharge status: Home / Self Care | Source: Ambulatory Visit | Attending: Urology | Admitting: Urology

## 2024-03-10 DIAGNOSIS — N200 Calculus of kidney: Secondary | ICD-10-CM | POA: Diagnosis not present

## 2024-03-10 DIAGNOSIS — Z90710 Acquired absence of both cervix and uterus: Secondary | ICD-10-CM | POA: Diagnosis not present

## 2024-03-10 DIAGNOSIS — R109 Unspecified abdominal pain: Secondary | ICD-10-CM | POA: Diagnosis not present

## 2024-03-14 LAB — URINE CULTURE

## 2024-03-15 ENCOUNTER — Other Ambulatory Visit (HOSPITAL_COMMUNITY): Payer: Self-pay

## 2024-03-15 ENCOUNTER — Ambulatory Visit: Payer: Self-pay | Admitting: Urology

## 2024-03-15 DIAGNOSIS — R8290 Unspecified abnormal findings in urine: Secondary | ICD-10-CM

## 2024-03-15 MED ORDER — NITROFURANTOIN MONOHYD MACRO 100 MG PO CAPS
ORAL_CAPSULE | ORAL | 0 refills | Status: AC
  Filled 2024-03-15: qty 60, 40d supply, fill #0

## 2024-03-16 ENCOUNTER — Encounter: Payer: Self-pay | Admitting: Urology

## 2024-03-16 ENCOUNTER — Other Ambulatory Visit: Payer: Self-pay

## 2024-03-20 ENCOUNTER — Ambulatory Visit: Admitting: Urology

## 2024-03-21 NOTE — Progress Notes (Addendum)
   03/21/2024  Patient ID: [name], female   DOB: 23-Feb-1946, 78 y.o.   MRN: [id number]  Lisinopril  has been discontinued by Dr. Myrna at patient's 8/28 visit (patient preference listed as reason).   No details in Dr. Zorita note regarding why patient is no longer taking.  BP at urology visit was 134/84 on 8/28. Contacted patient to clarify why medication was stopped, and she states the day it was prescribed she was in a hurry and stressed, causing her BP to be elevated.  She states she does not check BP at home, but it has been normal at recent provider visits.  She does continue to take diltiazem  120mg  capsule daily.  Channing DELENA Mealing, PharmD, DPLA

## 2024-04-05 ENCOUNTER — Ambulatory Visit (INDEPENDENT_AMBULATORY_CARE_PROVIDER_SITE_OTHER): Admitting: Urology

## 2024-04-05 ENCOUNTER — Encounter: Payer: Self-pay | Admitting: Urology

## 2024-04-05 VITALS — BP 152/84 | HR 88

## 2024-04-05 DIAGNOSIS — Z87440 Personal history of urinary (tract) infections: Secondary | ICD-10-CM | POA: Diagnosis not present

## 2024-04-05 DIAGNOSIS — R109 Unspecified abdominal pain: Secondary | ICD-10-CM

## 2024-04-05 DIAGNOSIS — N200 Calculus of kidney: Secondary | ICD-10-CM

## 2024-04-05 DIAGNOSIS — R8290 Unspecified abnormal findings in urine: Secondary | ICD-10-CM

## 2024-04-05 LAB — URINALYSIS, ROUTINE W REFLEX MICROSCOPIC
Bilirubin, UA: NEGATIVE
Glucose, UA: NEGATIVE
Nitrite, UA: POSITIVE — AB
Specific Gravity, UA: 1.02 (ref 1.005–1.030)
Urobilinogen, Ur: 1 mg/dL (ref 0.2–1.0)
pH, UA: 5.5 (ref 5.0–7.5)

## 2024-04-05 LAB — MICROSCOPIC EXAMINATION: WBC, UA: >30 /HPF — AB (ref 0–5)

## 2024-04-05 NOTE — Progress Notes (Signed)
 Assessment: 1. History of UTI   2. Nephrolithiasis   3. Abnormal urine findings   4. Left flank pain    Plan: I personally reviewed the CT study from 03/12/2024 with results as noted below. I discussed these results with the patient and her husband in detail today.  There is no evidence to suggest that the left-sided renal calculi are causing any obstruction and therefore would not be a source of pain.  Her symptoms are much more consistent with a musculoskeletal source for pain.  I recommended that she consult with her PCP or a back specialist for her symptoms.  Methods to reduce risk of UTIs discussed with the patient including increase fluid intake, timed and double voiding, daily cranberry supplement, daily probiotic, vaginal hormone replacement, and daily antibiotic prophylaxis. Recommend continuing daily Macrobid . Recommend beginning vaginal hormone cream 2-3 times per week.  She reports that she has medication at home.  Information provided [facility] urine culture sent today - will call with result and to arrange follow-up  Chief Complaint:  Chief Complaint  Patient presents with   History of UTI    History of Present Illness:  [name] is a 78 y.o. female who is seen for further evaluation of nephrolithiasis and recurrent UTIs.  Nephrolithiasis: She has a history of nephrolithiasis.  She has a history of medullary sponge kidney with multiple stones.  She has been treated with lithotripsy as well as ureteroscopy.  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  Prior evaluation with 24-hour urine showed no significant abnormalities.  Stone analysis showed calcium  oxalate and calcium  phosphate.  She underwent ureteroscopy for a large UPJ stone on the right in March 2023.  She subsequently developed a large right-sided hematoma.  She has had recurrent issues with UTIs since that time.  She has been treated for multiple UTIs.  She has also been  on suppressive antibiotics with both Macrodantin  and trimethoprim . She underwent evaluation with a MRI in January 2024 which showed almost complete resolution of the right perinephric hematoma.  She was previously followed at [facility] and was last seen in July 2024. KUB from 7/24 showed no obvious calcifications in the area of the right renal shadow; multiple calcifications overlying the left renal shadow; no obvious ureteral calcifications. Renal ultrasound showed no hydronephrosis, focal area of cortical thickening in the right kidney consistent with prior hematoma. CT abdomen pelvis without contrast from 03/12/2024 showed no right sided nephrolithiasis, right sided cortical thinning, multiple left-sided parenchymal stones predominantly localized to the mid pole, no hydronephrosis, no ureteral calculi.  Recurrent UTI's: Urine culture results: 4/25 >100 K E. Coli 5/25 >100 K E. Coli 8/25 >100 K E. Coli 8/25 >100 K E. coli  She was seen in August 2025 with a 2-week history of some left-sided flank and back pain.  Her pain was intermittent with increased symptoms at night.  She noted relief by taking ibuprofen and changing position.  No nausea or vomiting.  No gross hematuria.  She also noted some right sided flank pain.  She was not having any UTI symptoms at the time of her visit.  She was last treated for a UTI in early August 2025.  She was treated with Macrobid  x 10 days and started on daily Macrobid  for her recurrent UTIs.  She returns today for follow-up.  She continues to have left-sided flank and back pain.  This is worse at night and is positional.  She has decreased pain if she changes position.  She also notes improvement with a heating pad.  She continues on daily Macrobid .  She is not having any significant frequency, urgency, dysuria, or gross hematuria.  She does have vaginal irritation with wiping and intermittently after voiding.  She has been using antifungal cream vaginally  without improvement.  Portions of the above documentation were copied from a prior visit for review purposes only.   Past Medical History:  Past Medical History:  Diagnosis Date   Acute hypoxemic respiratory failure (HCC) 10/24/2016   Arthritis    both knees   Carpal tunnel syndrome, bilateral    both hands go to sleep at times from elbows down   Chronic diastolic heart failure (HCC)    DDD (degenerative disc disease), lumbosacral    Diabetes mellitus without complication (HCC)    Dyspnea    during chronic bronchitis, resolved at this time   First degree AV block    Hematuria 11/15/2017   History of kidney stones    History of rib fracture 03/2011   Lumbar stenosis    Obesity    Osteoporosis    Postlaminectomy syndrome    Pre-diabetes    Renal calculi    BILATERAL   Right ureteral stone    Rotator cuff tear, left    Rotator cuff tear, right    UTI (urinary tract infection)     Past Surgical History:  Past Surgical History:  Procedure Laterality Date   ANTERIOR CERVICAL DECOMP/DISCECTOMY FUSION  03-18-2011   C3 -- C7   ANTERIOR LATERAL LUMBAR FUSION 4 LEVELS N/A 05/06/2018   Procedure: Lumbar one- two Lumbar two -three Lumbar  three-four Lumbar four-five Anterolateral decompression/fusion/posterior bilateral laminectomy Lumbar four-five Lumbar five Sacral one/posterior percutaneous fixation/mazor;  Surgeon: Colon Shove, MD;  Location: [facility];  Service: Neurosurgery;  Laterality: N/A;   APPENDECTOMY     APPLICATION OF ROBOTIC ASSISTANCE FOR SPINAL PROCEDURE N/A 05/06/2018   Procedure: APPLICATION OF ROBOTIC ASSISTANCE FOR SPINAL PROCEDURE;  Surgeon: Colon Shove, MD;  Location: [facility] OR;  Service: Neurosurgery;  Laterality: N/A;   BILATERAL CARPAL TUNNEL RELEASE     3 screws in right hand, thumb, middle finger, and index finger,    CARDIAC CATHETERIZATION N/A 01/16/2015   Procedure: Left Heart Cath and Coronary Angiography;  Surgeon: Shove LELON Sharps, MD;  Location: [facility]  INVASIVE CV LAB;  Service: Cardiovascular;  Laterality: N/A;   CHOLECYSTECTOMY OPEN  1970'S   COLONOSCOPY  2018   CYSTOSCOPY WITH RETROGRADE PYELOGRAM, URETEROSCOPY AND STENT PLACEMENT N/A 02/13/2013   Procedure: CYSTOSCOPY WITH RETROGRADE PYELOGRAM, URETEROSCOPY AND LITHOTRIPSY ;  Surgeon: Mark C Ottelin, MD;  Location: [facility];  Service: Urology;  Laterality: N/A;   CYSTOSCOPY WITH URETEROSCOPY AND STENT PLACEMENT Left 04/17/2019   Procedure: CYSTOSCOPY WITH LEFT  RETROGRADE PYELOGRAM, LEFT URETEROSCOPY, HOLMIUM LASER AND LEFT STENT PLACEMENT;  Surgeon: Ottelin, Mark, MD;  Location: [facility];  Service: Urology;  Laterality: Left;   CYSTOSCOPY/URETEROSCOPY/HOLMIUM LASER/STENT PLACEMENT Right 11/22/2017   Procedure: CYSTOSCOPY/URETEROSCOPY/RETROGRADE PYELOGRAM/HOLMIUM LASER/STENT PLACEMENT;  Surgeon: Ottelin, Mark, MD;  Location: [facility];  Service: Urology;  Laterality: Right;   EXTRACORPOREAL SHOCK WAVE LITHOTRIPSY Right 05-16-2012   FINGER ARTHROSCOPY WITH CARPOMETACARPEL [facility]) ARTHROPLASTY     HOLMIUM LASER APPLICATION Right 02/13/2013   Procedure: HOLMIUM LASER APPLICATION;  Surgeon: Oneil JAYSON Rafter, MD;  Location: [facility];  Service: Urology;  Laterality: Right;   IR URETERAL STENT LEFT NEW ACCESS W/O SEP  NEPHROSTOMY CATH  02/13/2019   KNEE ARTHROSCOPY Bilateral    LUMBAR LAMINECTOMY  X2  1960'S   LUMBAR PERCUTANEOUS PEDICLE SCREW 4 LEVEL N/A 05/06/2018   Procedure: LUMBAR PERCUTANEOUS PEDICLE SCREW LUMBAR ONE TO LUMBAR FIVE WITH MAZOR ;POSTERIOR LATERAL LAMINECTOMY LUMBAR FOUR-FIVE,FIVE-SACRAL ONE;  Surgeon: Colon Shove, MD;  Location: [facility];  Service: Neurosurgery;  Laterality: N/A;   NASAL SEPTUM SURGERY  1970'S   NEPHROLITHOTOMY Left 02/20/2019   Procedure: NEPHROLITHOTOMY PERCUTANEOUS;  Surgeon: Ottelin, Mark, MD;  Location: [facility];  Service: Urology;  Laterality: Left;   SHOULDER OPEN ROTATOR CUFF REPAIR Bilateral LEFT 01-11-2001/   RIGHT  07-21-2002   TOTAL KNEE ARTHROPLASTY  04/13/2012   Procedure: TOTAL KNEE ARTHROPLASTY;  Surgeon: Dempsey LULLA Moan, MD;  Location: [facility];  Service: Orthopedics;  Laterality: Left;   TOTAL KNEE ARTHROPLASTY  08/08/2012   Procedure: TOTAL KNEE ARTHROPLASTY;  Surgeon: Dempsey LULLA Moan, MD;  Location: [facility];  Service: Orthopedics;  Laterality: Right;   TRANSTHORACIC ECHOCARDIOGRAM  03-29-2011   MODERATE LVH/ EF 60-65%/ MILD MR   URETEROSCOPY WITH HOLMIUM LASER LITHOTRIPSY Left 06/19/2019   Procedure: URETEROSCOPY WITH HOLMIUM LASER LITHOTRIPSY/INSERTION OF DOUBLE J STENT;  Surgeon: Ottelin, Mark, MD;  Location: [facility];  Service: Urology;  Laterality: Left;   VAGINAL HYSTERECTOMY  1960'S    Allergies:  Allergies  Allergen Reactions   Contrast Media [Iodinated Contrast Media] Hives and Rash    Omnipaque .  Rash, itching   Cipro  [Ciprofloxacin  Hcl] Nausea And Vomiting    Pt reports she thinks she is allergic and reports its caused her nausea, anxious, and weakness. Pt unsure.   Doxycycline Nausea And Vomiting and Other (See Comments)   Gabapentin  Other (See Comments)    Too drowsy   Metformin  Hcl Diarrhea and Other (See Comments)   Pregabalin Other (See Comments)    Too drowsy   Semaglutide (0.25 Or 0.5mg -Dos) Other (See Comments)    Stomach upset   Ancef  [Cefazolin ] Rash    Rash to ancef  vs omniopaue contrast   Augmentin  [Amoxicillin -Pot Clavulanate] Itching, Nausea And Vomiting and Rash    Did it involve swelling of the face/tongue/throat, SOB, or low BP? Unknown Did it involve sudden or severe rash/hives, skin peeling, or any reaction on the inside of your mouth or nose? Yes Did you need to seek medical attention at a hospital or doctor's office? No When did it last happen? 2020      If all above answers are "NO", may proceed with cephalosporin use.    Sulfa  Antibiotics Itching, Nausea And Vomiting, Rash and Other (See Comments)   Tape Swelling    Adhesive Tape    Family History:   Family History  Problem Relation Age of Onset   Lung cancer Mother    Diabetes type II Mother    Lung cancer Father    Diabetes Sister    Hypertension Sister    Hypertension Brother    Diabetes Brother    Heart disease Brother    Diabetes Brother    Hypertension Brother    Heart disease Brother     Social History:  Social History   Tobacco Use   Smoking status: Never   Smokeless tobacco: Never  Vaping Use   Vaping status: Never Used  Substance Use Topics   Alcohol use: No   Drug use: No    ROS: Constitutional:  Negative for fever, chills, weight loss CV: Negative for chest pain, previous MI, hypertension Respiratory:  Negative for shortness of  breath, wheezing, sleep apnea, frequent cough GI:  Negative for nausea, vomiting, bloody stool, GERD  Physical exam: BP (!) 152/84   Pulse 88  GENERAL APPEARANCE:  Well appearing, well developed, well nourished, NAD HEENT:  Atraumatic, normocephalic, oropharynx clear NECK:  Supple without lymphadenopathy or thyromegaly ABDOMEN:  Soft, non-tender, no masses EXTREMITIES:  Moves all extremities well, without clubbing, cyanosis, or edema NEUROLOGIC:  Alert and oriented x 3, normal gait, CN II-XII grossly intact MENTAL STATUS:  appropriate BACK:  Non-tender to palpation, No CVAT SKIN:  Warm, dry, and intact  Results: U/A: >30 WBCs, 11-30 RBCs, many bacteria, nitrite positive

## 2024-04-10 ENCOUNTER — Other Ambulatory Visit: Payer: Self-pay

## 2024-04-10 ENCOUNTER — Encounter: Payer: Self-pay | Admitting: Urology

## 2024-04-10 ENCOUNTER — Other Ambulatory Visit: Payer: Self-pay | Admitting: Urology

## 2024-04-10 DIAGNOSIS — R8290 Unspecified abnormal findings in urine: Secondary | ICD-10-CM

## 2024-04-10 DIAGNOSIS — N390 Urinary tract infection, site not specified: Secondary | ICD-10-CM

## 2024-04-10 DIAGNOSIS — Z87440 Personal history of urinary (tract) infections: Secondary | ICD-10-CM

## 2024-04-10 MED ORDER — FOSFOMYCIN TROMETHAMINE 3 G PO PACK
3.0000 g | PACK | Freq: Once | ORAL | 0 refills | Status: AC
  Filled 2024-04-10 – 2024-04-12 (×2): qty 3, 1d supply, fill #0

## 2024-04-10 NOTE — Telephone Encounter (Signed)
 Notified pt as advised. Made 2 week f/u for lab, urinalysis order placed, patient expressed understanding.

## 2024-04-10 NOTE — Telephone Encounter (Signed)
-----   Message from [name] sent at 04/10/2024  1:15 PM EDT ----- Please notify patient to take fosfomycin 3 gm x 1 dose for UTI treatment.  Rx sent to pharmacy. She can then resume the daily macrobid . I would like for her to repeat a U/A in 2 weeks.

## 2024-04-12 ENCOUNTER — Other Ambulatory Visit: Payer: Self-pay

## 2024-04-12 ENCOUNTER — Telehealth: Payer: Self-pay | Admitting: Urology

## 2024-04-12 ENCOUNTER — Other Ambulatory Visit (HOSPITAL_COMMUNITY): Payer: Self-pay

## 2024-04-12 NOTE — Telephone Encounter (Signed)
 Patient and their insurance called about the medication fosfomycin tromethamine  3 gram oral tablets. Pt insurance company is needing clinical information to approve this medication please call [phone] option number 2.

## 2024-04-14 ENCOUNTER — Other Ambulatory Visit: Payer: Self-pay

## 2024-04-14 ENCOUNTER — Other Ambulatory Visit (HOSPITAL_COMMUNITY): Payer: Self-pay

## 2024-04-14 MED ORDER — DILTIAZEM HCL ER COATED BEADS 120 MG PO CP24
120.0000 mg | ORAL_CAPSULE | Freq: Every day | ORAL | 3 refills | Status: DC
  Filled 2024-04-14: qty 90, 90d supply, fill #0

## 2024-04-14 NOTE — Telephone Encounter (Signed)
 Spoke with insurance company in reference to [name] for medication. Insurance stated they will fax over their decision.

## 2024-04-18 ENCOUNTER — Other Ambulatory Visit (HOSPITAL_COMMUNITY): Payer: Self-pay

## 2024-04-20 ENCOUNTER — Encounter: Payer: Self-pay | Admitting: Nurse Practitioner

## 2024-04-21 ENCOUNTER — Other Ambulatory Visit: Payer: Self-pay

## 2024-04-21 ENCOUNTER — Other Ambulatory Visit (HOSPITAL_COMMUNITY): Payer: Self-pay

## 2024-04-21 MED ORDER — OZEMPIC (0.25 OR 0.5 MG/DOSE) 2 MG/3ML ~~LOC~~ SOPN
0.2500 mg | PEN_INJECTOR | SUBCUTANEOUS | 0 refills | Status: DC
  Filled 2024-04-21: qty 3, 42d supply, fill #0

## 2024-04-22 ENCOUNTER — Other Ambulatory Visit (HOSPITAL_COMMUNITY): Payer: Self-pay

## 2024-04-24 ENCOUNTER — Other Ambulatory Visit: Payer: Self-pay | Admitting: Urology

## 2024-04-24 ENCOUNTER — Other Ambulatory Visit (HOSPITAL_COMMUNITY): Payer: Self-pay

## 2024-04-24 ENCOUNTER — Other Ambulatory Visit

## 2024-04-24 ENCOUNTER — Other Ambulatory Visit: Payer: Self-pay

## 2024-04-24 ENCOUNTER — Ambulatory Visit: Payer: Self-pay | Admitting: Urology

## 2024-04-24 ENCOUNTER — Telehealth: Payer: Self-pay

## 2024-04-24 DIAGNOSIS — R8290 Unspecified abnormal findings in urine: Secondary | ICD-10-CM | POA: Diagnosis not present

## 2024-04-24 DIAGNOSIS — N390 Urinary tract infection, site not specified: Secondary | ICD-10-CM | POA: Diagnosis not present

## 2024-04-24 LAB — URINALYSIS, ROUTINE W REFLEX MICROSCOPIC
Bilirubin, UA: NEGATIVE
Glucose, UA: NEGATIVE
Nitrite, UA: POSITIVE — AB
Specific Gravity, UA: 1.02 (ref 1.005–1.030)
Urobilinogen, Ur: 1 mg/dL (ref 0.2–1.0)
pH, UA: 5.5 (ref 5.0–7.5)

## 2024-04-24 LAB — MICROSCOPIC EXAMINATION: WBC, UA: >30 /HPF — AB (ref 0–5)

## 2024-04-24 NOTE — Telephone Encounter (Signed)
 Copied from CRM [id number]. Topic: Clinical - Medication Prior Auth >> Apr 24, 2024  4:10 PM Dedra B wrote: Reason for CRM: Sonu with [employer] called regarding PA for Ozempic . She needs the pt's diagnosis name, chart notes, and A1c results. Information can be faxed to [phone]. Sonu can be reached at [phone] option 2.

## 2024-04-24 NOTE — Telephone Encounter (Signed)
 Pharmacy Patient Advocate Encounter   Received notification from CoverMyMeds that prior authorization for Ozempic  (0.25 or 0.5 MG/DOSE) 2MG /3ML pen-injectors  is required/requested.   Insurance verification completed.   The patient is insured through [facility] [employer].   Per test claim: PA required; PA submitted to above mentioned insurance via Latent Key/confirmation #/EOC [id number] Status is pending

## 2024-04-26 ENCOUNTER — Ambulatory Visit: Payer: Self-pay | Admitting: Urology

## 2024-04-26 DIAGNOSIS — N390 Urinary tract infection, site not specified: Secondary | ICD-10-CM

## 2024-04-26 LAB — URINE CULTURE

## 2024-04-26 MED ORDER — METHENAMINE HIPPURATE 1 G PO TABS
1.0000 g | ORAL_TABLET | Freq: Two times a day (BID) | ORAL | 3 refills | Status: DC
  Filled 2024-04-26: qty 60, 30d supply, fill #0

## 2024-04-26 MED ORDER — CEFDINIR 300 MG PO CAPS
300.0000 mg | ORAL_CAPSULE | Freq: Two times a day (BID) | ORAL | 0 refills | Status: DC
  Filled 2024-04-26: qty 14, 7d supply, fill #0

## 2024-04-26 NOTE — Telephone Encounter (Signed)
 Requested information faxed to [employer] at [phone].

## 2024-04-27 ENCOUNTER — Other Ambulatory Visit: Payer: Self-pay

## 2024-04-27 ENCOUNTER — Other Ambulatory Visit (HOSPITAL_COMMUNITY): Payer: Self-pay

## 2024-04-27 DIAGNOSIS — N390 Urinary tract infection, site not specified: Secondary | ICD-10-CM

## 2024-04-27 MED ORDER — CEFDINIR 300 MG PO CAPS: 300.0000 mg | ORAL_CAPSULE | Freq: Two times a day (BID) | ORAL | 0 refills | Status: AC

## 2024-04-27 MED ORDER — METHENAMINE HIPPURATE 1 G PO TABS
1.0000 g | ORAL_TABLET | Freq: Two times a day (BID) | ORAL | 3 refills | Status: AC
Start: 2024-04-27 — End: ?

## 2024-05-15 ENCOUNTER — Ambulatory Visit: Admitting: Nurse Practitioner

## 2024-05-15 ENCOUNTER — Encounter: Payer: Self-pay | Admitting: Nurse Practitioner

## 2024-05-15 VITALS — BP 134/78 | HR 84 | Temp 97.6°F | Ht 62.0 in | Wt 251.2 lb

## 2024-05-15 DIAGNOSIS — I10 Essential (primary) hypertension: Secondary | ICD-10-CM | POA: Diagnosis not present

## 2024-05-15 DIAGNOSIS — Z0000 Encounter for general adult medical examination without abnormal findings: Secondary | ICD-10-CM | POA: Diagnosis not present

## 2024-05-15 DIAGNOSIS — E559 Vitamin D deficiency, unspecified: Secondary | ICD-10-CM

## 2024-05-15 DIAGNOSIS — I5032 Chronic diastolic (congestive) heart failure: Secondary | ICD-10-CM

## 2024-05-15 DIAGNOSIS — M5416 Radiculopathy, lumbar region: Secondary | ICD-10-CM

## 2024-05-15 DIAGNOSIS — K5900 Constipation, unspecified: Secondary | ICD-10-CM

## 2024-05-15 DIAGNOSIS — I471 Supraventricular tachycardia, unspecified: Secondary | ICD-10-CM | POA: Diagnosis not present

## 2024-05-15 DIAGNOSIS — Z23 Encounter for immunization: Secondary | ICD-10-CM | POA: Diagnosis not present

## 2024-05-15 DIAGNOSIS — Z1159 Encounter for screening for other viral diseases: Secondary | ICD-10-CM

## 2024-05-15 DIAGNOSIS — M810 Age-related osteoporosis without current pathological fracture: Secondary | ICD-10-CM | POA: Diagnosis not present

## 2024-05-15 DIAGNOSIS — E7800 Pure hypercholesterolemia, unspecified: Secondary | ICD-10-CM | POA: Diagnosis not present

## 2024-05-15 DIAGNOSIS — Z7985 Long-term (current) use of injectable non-insulin antidiabetic drugs: Secondary | ICD-10-CM | POA: Diagnosis not present

## 2024-05-15 DIAGNOSIS — E119 Type 2 diabetes mellitus without complications: Secondary | ICD-10-CM | POA: Diagnosis not present

## 2024-05-15 DIAGNOSIS — N390 Urinary tract infection, site not specified: Secondary | ICD-10-CM

## 2024-05-15 LAB — VITAMIN D 25 HYDROXY (VIT D DEFICIENCY, FRACTURES): VITD: 28.37 ng/mL — ABNORMAL LOW (ref 30.00–100.00)

## 2024-05-15 LAB — LIPID PANEL
Cholesterol: 193 mg/dL (ref 0–200)
HDL: 78.6 mg/dL (ref >39.00)
LDL Cholesterol: 93 mg/dL (ref 0–99)
NonHDL: 114.39
Total CHOL/HDL Ratio: 2
Triglycerides: 107 mg/dL (ref 0.0–149.0)
VLDL: 21.4 mg/dL (ref 0.0–40.0)

## 2024-05-15 LAB — COMPREHENSIVE METABOLIC PANEL WITH GFR
ALT: 14 U/L (ref 0–35)
AST: 16 U/L (ref 0–37)
Albumin: 4.2 g/dL (ref 3.5–5.2)
Alkaline Phosphatase: 83 U/L (ref 39–117)
BUN: 20 mg/dL (ref 6–23)
CO2: 25 meq/L (ref 19–32)
Calcium: 9.3 mg/dL (ref 8.4–10.5)
Chloride: 103 meq/L (ref 96–112)
Creatinine, Ser: 0.86 mg/dL (ref 0.40–1.20)
GFR: 64.16 mL/min (ref >60.00)
Glucose, Bld: 115 mg/dL — ABNORMAL HIGH (ref 70–99)
Potassium: 4 meq/L (ref 3.5–5.1)
Sodium: 138 meq/L (ref 135–145)
Total Bilirubin: 0.8 mg/dL (ref 0.2–1.2)
Total Protein: 7.7 g/dL (ref 6.0–8.3)

## 2024-05-15 LAB — POCT GLYCOSYLATED HEMOGLOBIN (HGB A1C)
HbA1c POC (<> result, manual entry): 6.4 % (ref 4.0–5.6)
HbA1c, POC (controlled diabetic range): 6.4 % (ref 0.0–7.0)
HbA1c, POC (prediabetic range): 6.4 % (ref 5.7–6.4)
Hemoglobin A1C: 6.4 % — AB (ref 4.0–5.6)

## 2024-05-15 MED ORDER — OZEMPIC (0.25 OR 0.5 MG/DOSE) 2 MG/3ML ~~LOC~~ SOPN: 0.5000 mg | PEN_INJECTOR | SUBCUTANEOUS | 0 refills | Status: DC

## 2024-05-15 MED ORDER — DILTIAZEM HCL ER COATED BEADS 120 MG PO CP24: 120.0000 mg | ORAL_CAPSULE | Freq: Every day | ORAL | 3 refills | Status: AC

## 2024-05-15 NOTE — Progress Notes (Signed)
 BP 134/78 (BP Location: Left Arm, Cuff Size: Large)   Pulse 84   Temp 97.6 F (36.4 C) (Temporal)   Ht 5' 2 (1.575 m)   Wt 251 lb 3.2 oz (113.9 kg)   SpO2 98%   BMI 45.95 kg/m    Subjective:    Patient ID: Ricka GORMAN Hock, female    DOB: 1946-02-08, 78 y.o.   MRN: [id number]  CC: Chief Complaint  Patient presents with   Annual Exam    Pt is here today to be seen for her Annual Exam. Fasting. Would like her flu shot    HPI: AJANAE VIRAG is a 78 y.o. female presenting on 05/15/2024 for comprehensive medical examination. Current medical complaints include:none  She currently lives with: husband Menopausal Symptoms: no  Depression and Anxiety Screen done today and results listed below:     05/15/2024    8:33 AM 10/20/2023    1:38 PM 05/12/2023    8:36 AM 11/02/2022   11:20 AM 05/01/2022    2:00 PM  Depression screen PHQ 2/9  Decreased Interest 0 0 0 0 0  Down, Depressed, Hopeless 0 0 0 0 0  PHQ - 2 Score 0 0 0 0 0  Altered sleeping 0  0 0   Tired, decreased energy 3  0 3   Change in appetite 0  0 0   Feeling bad or failure about yourself  0  0 0   Trouble concentrating 0  0 0   Moving slowly or fidgety/restless 0  0 0   Suicidal thoughts 0  0 0   PHQ-9 Score 3  0 3   Difficult doing work/chores Not difficult at all          05/15/2024    8:34 AM 05/12/2023    8:36 AM 11/02/2022   11:20 AM 05/02/2022   12:41 PM  GAD 7 : Generalized Anxiety Score  Nervous, Anxious, on Edge 0 0 0 0  Control/stop worrying 0 0 0 0  Worry too much - different things 0 0 0 0  Trouble relaxing 0 0 0 0  Restless 0 0 0 0  Easily annoyed or irritable 0 0 0 0  Afraid - awful might happen 0 0 0 0  Total GAD 7 Score 0 0 0 0  Anxiety Difficulty Not difficult at all   Not difficult at all    The patient has a history of falls. I did complete a risk assessment for falls. A plan of care for falls was documented.   Past Medical History:  Past Medical History:  Diagnosis Date   Acute  hypoxemic respiratory failure (HCC) 10/24/2016   Arthritis    both knees   Carpal tunnel syndrome, bilateral    both hands go to sleep at times from elbows down   Chronic diastolic heart failure (HCC)    DDD (degenerative disc disease), lumbosacral    Diabetes mellitus without complication (HCC)    Dyspnea    during chronic bronchitis, resolved at this time   First degree AV block    Hematuria 11/15/2017   History of kidney stones    History of rib fracture 03/2011   Lumbar stenosis    Obesity    Osteoporosis    Postlaminectomy syndrome    Pre-diabetes    Renal calculi    BILATERAL   Right ureteral stone    Rotator cuff tear, left    Rotator cuff tear, right    UTI (  urinary tract infection)     Surgical History:  Past Surgical History:  Procedure Laterality Date   ANTERIOR CERVICAL DECOMP/DISCECTOMY FUSION  03-18-2011   C3 -- C7   ANTERIOR LATERAL LUMBAR FUSION 4 LEVELS N/A 05/06/2018   Procedure: Lumbar one- two Lumbar two -three Lumbar  three-four Lumbar four-five Anterolateral decompression/fusion/posterior bilateral laminectomy Lumbar four-five Lumbar five Sacral one/posterior percutaneous fixation/mazor;  Surgeon: Colon Shove, MD;  Location: [facility];  Service: Neurosurgery;  Laterality: N/A;   APPENDECTOMY     APPLICATION OF ROBOTIC ASSISTANCE FOR SPINAL PROCEDURE N/A 05/06/2018   Procedure: APPLICATION OF ROBOTIC ASSISTANCE FOR SPINAL PROCEDURE;  Surgeon: Colon Shove, MD;  Location: [facility];  Service: Neurosurgery;  Laterality: N/A;   BILATERAL CARPAL TUNNEL RELEASE     3 screws in right hand, thumb, middle finger, and index finger,    CARDIAC CATHETERIZATION N/A 01/16/2015   Procedure: Left Heart Cath and Coronary Angiography;  Surgeon: Shove LELON Sharps, MD;  Location: [facility] INVASIVE CV LAB;  Service: Cardiovascular;  Laterality: N/A;   CHOLECYSTECTOMY OPEN  1970'S   COLONOSCOPY  2018   CYSTOSCOPY WITH RETROGRADE PYELOGRAM, URETEROSCOPY AND STENT PLACEMENT N/A 02/13/2013    Procedure: CYSTOSCOPY WITH RETROGRADE PYELOGRAM, URETEROSCOPY AND LITHOTRIPSY ;  Surgeon: Mark C Ottelin, MD;  Location: [facility];  Service: Urology;  Laterality: N/A;   CYSTOSCOPY WITH URETEROSCOPY AND STENT PLACEMENT Left 04/17/2019   Procedure: CYSTOSCOPY WITH LEFT  RETROGRADE PYELOGRAM, LEFT URETEROSCOPY, HOLMIUM LASER AND LEFT STENT PLACEMENT;  Surgeon: Ottelin, Mark, MD;  Location: [facility];  Service: Urology;  Laterality: Left;   CYSTOSCOPY/URETEROSCOPY/HOLMIUM LASER/STENT PLACEMENT Right 11/22/2017   Procedure: CYSTOSCOPY/URETEROSCOPY/RETROGRADE PYELOGRAM/HOLMIUM LASER/STENT PLACEMENT;  Surgeon: Ottelin, Mark, MD;  Location: [facility];  Service: Urology;  Laterality: Right;   EXTRACORPOREAL SHOCK WAVE LITHOTRIPSY Right 05-16-2012   FINGER ARTHROSCOPY WITH CARPOMETACARPEL [facility]) ARTHROPLASTY     HOLMIUM LASER APPLICATION Right 02/13/2013   Procedure: HOLMIUM LASER APPLICATION;  Surgeon: Oneil JAYSON Rafter, MD;  [facility];  Service: Urology;  Laterality: Right;   IR URETERAL STENT LEFT NEW ACCESS W/O SEP NEPHROSTOMY CATH  02/13/2019   KNEE ARTHROSCOPY Bilateral    LUMBAR LAMINECTOMY  X2  1960'S   LUMBAR PERCUTANEOUS PEDICLE SCREW 4 LEVEL N/A 05/06/2018   Procedure: LUMBAR PERCUTANEOUS PEDICLE SCREW LUMBAR ONE TO LUMBAR FIVE WITH MAZOR ;POSTERIOR LATERAL LAMINECTOMY LUMBAR FOUR-FIVE,FIVE-SACRAL ONE;  Surgeon: Colon Shove, MD;  Location: [facility];  Service: Neurosurgery;  Laterality: N/A;   NASAL SEPTUM SURGERY  1970'S   NEPHROLITHOTOMY Left 02/20/2019   Procedure: NEPHROLITHOTOMY PERCUTANEOUS;  Surgeon: Ottelin, Mark, MD;  Location: [facility];  Service: Urology;  Laterality: Left;   SHOULDER OPEN ROTATOR CUFF REPAIR Bilateral LEFT 01-11-2001/   RIGHT 07-21-2002   TOTAL KNEE ARTHROPLASTY  04/13/2012   Procedure: TOTAL KNEE ARTHROPLASTY;  Surgeon: Dempsey LULLA Moan, MD;  Location: [facility];  Service: Orthopedics;  Laterality: Left;   TOTAL KNEE  ARTHROPLASTY  08/08/2012   Procedure: TOTAL KNEE ARTHROPLASTY;  Surgeon: Dempsey LULLA Moan, MD;  Location: [facility];  Service: Orthopedics;  Laterality: Right;   TRANSTHORACIC ECHOCARDIOGRAM  03-29-2011   MODERATE LVH/ EF 60-65%/ MILD MR   URETEROSCOPY WITH HOLMIUM LASER LITHOTRIPSY Left 06/19/2019   Procedure: URETEROSCOPY WITH HOLMIUM LASER LITHOTRIPSY/INSERTION OF DOUBLE J STENT;  Surgeon: Ottelin, Mark, MD;  Location: [facility];  Service: Urology;  Laterality: Left;   VAGINAL HYSTERECTOMY  1960'S    Medications:  Current Outpatient Medications on File Prior to Visit  Medication Sig   aspirin  EC 81 MG tablet Take 81 mg by mouth daily. Swallow whole.   diclofenac  sodium (VOLTAREN ) 1 % GEL Apply 2 g topically 3 (three) times daily as needed (back pain).    gabapentin  (NEURONTIN ) 100 MG capsule Take 1 capsule (100 mg total) by mouth 3 (three) times daily as needed (leg pain, tingling).   ketoconazole  (NIZORAL ) 2 % cream Apply 1 Application topically daily.   methenamine (HIPREX) 1 g tablet Take 1 tablet (1 g total) by mouth 2 (two) times daily with a meal. Begin after completing Cefdinir    terconazole  (TERAZOL 7 ) 0.4 % vaginal cream Place 1 applicator vaginally at bedtime.   No current facility-administered medications on file prior to visit.    Allergies:  Allergies  Allergen Reactions   Contrast Media [Iodinated Contrast Media] Hives and Rash    Omnipaque .  Rash, itching   Cipro  [Ciprofloxacin  Hcl] Nausea And Vomiting    Pt reports she thinks she is allergic and reports its caused her nausea, anxious, and weakness. Pt unsure.   Doxycycline Nausea And Vomiting and Other (See Comments)   Gabapentin  Other (See Comments)    Too drowsy   Metformin  Hcl Diarrhea and Other (See Comments)   Pregabalin Other (See Comments)    Too drowsy   Semaglutide (0.25 Or 0.5mg -Dos) Other (See Comments)    Stomach upset   Ancef  [Cefazolin ] Rash    Rash to ancef  vs omniopaue contrast   Augmentin   [Amoxicillin -Pot Clavulanate] Itching, Nausea And Vomiting and Rash    Did it involve swelling of the face/tongue/throat, SOB, or low BP? Unknown Did it involve sudden or severe rash/hives, skin peeling, or any reaction on the inside of your mouth or nose? Yes Did you need to seek medical attention at a hospital or doctor's office? No When did it last happen? 2020      If all above answers are "NO", may proceed with cephalosporin use.    Sulfa  Antibiotics Itching, Nausea And Vomiting, Rash and Other (See Comments)   Tape Swelling    Adhesive Tape    Social History:  Social History   Socioeconomic History   Marital status: Married    Spouse name: Not on file   Number of children: Not on file   Years of education: Not on file   Highest education level: Associate degree: occupational, scientist, product/process development, or vocational program  Occupational History   Not on file  Tobacco Use   Smoking status: Never   Smokeless tobacco: Never  Vaping Use   Vaping status: Never Used  Substance and Sexual Activity   Alcohol use: No   Drug use: No   Sexual activity: Yes    Partners: Male    Birth control/protection: Surgical  Other Topics Concern   Not on file  Social History Narrative   Not on file   Social Drivers of Health   Financial Resource Strain: Low Risk  (02/28/2024)   Overall Financial Resource Strain (CARDIA)    Difficulty of Paying Living Expenses: Not very hard  Food Insecurity: No Food Insecurity (02/28/2024)   Hunger Vital Sign    Worried About Running Out of Food in the Last Year: Never true    Ran Out of Food in the Last Year: Never true  Transportation Needs: No Transportation Needs (02/28/2024)   PRAPARE - Administrator, Civil Service (Medical): No    Lack of Transportation (Non-Medical): No  Physical Activity: Inactive (02/28/2024)   Exercise Vital Sign  Days of Exercise per Week: 0 days    Minutes of Exercise per Session: Not on file  Stress: No Stress Concern  Present (02/28/2024)   [employer] of Occupational Health - Occupational Stress Questionnaire    Feeling of Stress: Not at all  Social Connections: Socially Integrated (02/28/2024)   Social Connection and Isolation Panel    Frequency of Communication with Friends and Family: More than three times a week    Frequency of Social Gatherings with Friends and Family: Once a week    Attends Religious Services: More than 4 times per year    Active Member of [employer] or Organizations: Yes    Attends Engineer, Structural: More than 4 times per year    Marital Status: Married  Catering Manager Violence: Not At Risk (02/10/2022)   Humiliation, Afraid, Rape, and Kick questionnaire    Fear of Current or Ex-Partner: No    Emotionally Abused: No    Physically Abused: No    Sexually Abused: No   Social History   Tobacco Use  Smoking Status Never  Smokeless Tobacco Never   Social History   Substance and Sexual Activity  Alcohol Use No    Family History:  Family History  Problem Relation Age of Onset   Lung cancer Mother    Diabetes type II Mother    Lung cancer Father    Diabetes Sister    Hypertension Sister    Hypertension Brother    Diabetes Brother    Heart disease Brother    Diabetes Brother    Hypertension Brother    Heart disease Brother     Past medical history, surgical history, medications, allergies, family history and social history reviewed with patient today and changes made to appropriate areas of the chart.   Review of Systems  Constitutional:  Positive for malaise/fatigue. Negative for fever.  HENT: Negative.    Eyes: Negative.   Respiratory:  Positive for shortness of breath (with exertion).   Cardiovascular: Negative.   Gastrointestinal:  Positive for constipation. Negative for abdominal pain, diarrhea, nausea and vomiting.  Genitourinary: Negative.   Musculoskeletal: Negative.   Skin: Negative.   Neurological: Negative.   Psychiatric/Behavioral:  Negative.     All other ROS negative except what is listed above and in the HPI.      Objective:    BP 134/78 (BP Location: Left Arm, Cuff Size: Large)   Pulse 84   Temp 97.6 F (36.4 C) (Temporal)   Ht 5' 2 (1.575 m)   Wt 251 lb 3.2 oz (113.9 kg)   SpO2 98%   BMI 45.95 kg/m   Wt Readings from Last 3 Encounters:  05/15/24 251 lb 3.2 oz (113.9 kg)  03/09/24 250 lb (113.4 kg)  03/03/24 254 lb 12.8 oz (115.6 kg)    Physical Exam Vitals and nursing note reviewed.  Constitutional:      General: She is not in acute distress.    Appearance: Normal appearance.  HENT:     Head: Normocephalic and atraumatic.     Right Ear: Tympanic membrane, ear canal and external ear normal.     Left Ear: Tympanic membrane, ear canal and external ear normal.  Eyes:     Conjunctiva/sclera: Conjunctivae normal.  Cardiovascular:     Rate and Rhythm: Normal rate and regular rhythm.     Pulses: Normal pulses.     Heart sounds: Normal heart sounds.  Pulmonary:     Effort: Pulmonary effort is normal.  Breath sounds: Normal breath sounds.  Abdominal:     Palpations: Abdomen is soft.     Tenderness: There is no abdominal tenderness.  Musculoskeletal:        General: Normal range of motion.     Cervical back: Normal range of motion and neck supple. No tenderness.     Right lower leg: Edema (trace) present.     Left lower leg: Edema (trace) present.     Comments: Generalized weakness   Lymphadenopathy:     Cervical: No cervical adenopathy.  Skin:    General: Skin is warm and dry.  Neurological:     General: No focal deficit present.     Mental Status: She is alert and oriented to person, place, and time.     Cranial Nerves: No cranial nerve deficit.     Coordination: Coordination normal.     Gait: Gait normal.  Psychiatric:        Mood and Affect: Mood normal.        Behavior: Behavior normal.        Thought Content: Thought content normal.        Judgment: Judgment normal.      Results for orders placed or performed in visit on 04/24/24  Urine culture   Collection Time: 04/24/24  1:00 PM   Specimen: Urine   UR  Result Value Ref Range   Urine Culture, Routine Final report (A)    Organism ID, Bacteria Escherichia coli (A)    Antimicrobial Susceptibility Comment       Assessment & Plan:   Problem List Items Addressed This Visit       Cardiovascular and Mediastinum   Chronic diastolic heart failure (HCC)   Chronic, stable. She still does have some shortness of breath with exertion. She has appointment with cardiology next month, encouraged her to keep this appointment.       Relevant Medications   diltiazem  (CARDIZEM  CD) 120 MG 24 hr capsule   Other Relevant Orders   Comprehensive metabolic panel with GFR   Essential hypertension   Chronic, stable. She declined taking lisinopril . Continue cardizem  120mg  daily. Check CMP, CBC, today.       Relevant Medications   diltiazem  (CARDIZEM  CD) 120 MG 24 hr capsule   Other Relevant Orders   Comprehensive metabolic panel with GFR   Paroxysmal SVT (supraventricular tachycardia)   Chronic, stable. Will have her continue diltiazem  120mg  daily. Continue collaboration and recommendations from cardiology.       Relevant Medications   diltiazem  (CARDIZEM  CD) 120 MG 24 hr capsule     Endocrine   Type 2 diabetes mellitus without complications (HCC)   Chronic, stable. Continue ozempic  0.25mg  injection weekly for 1 more week, then increase to 0.5mg  weekly. Check CMP, CBC, A1c today. Requesting eye exam. Follow-up in 3 months.       Relevant Medications   Semaglutide ,0.25 or 0.5MG /DOS, (OZEMPIC , 0.25 OR 0.5 MG/DOSE,) 2 MG/3ML SOPN   Other Relevant Orders   Comprehensive metabolic panel with GFR   POCT glycosylated hemoglobin (Hb A1C)     Nervous and Auditory   Lumbar radiculopathy   She experiences chronic pain and numbness in her leg and ankle post-back surgery, especially at night. She can increase  gabapentin  to 100-200mg  as needed daily.         Musculoskeletal and Integument   Osteoporosis without current pathological fracture   Dexa scan in 2019 showed T score -2.5. She declines medication at this time. Check  vitamin D  today         Genitourinary   Recurrent UTI   She is currently following with urology and is taking methenamine 1g BID. Continue collaboration and recommendations from urology.         Other   Morbid obesity (HCC)   BMI 45.9. She has restarted ozempic  for diabetes and weight management.  Continue focus on healthy eating and walking as able.       Relevant Medications   Semaglutide ,0.25 or 0.5MG /DOS, (OZEMPIC , 0.25 OR 0.5 MG/DOSE,) 2 MG/3ML SOPN   HLD (hyperlipidemia)   She declines statin. Check CMP, CBC, lipid panel today      Relevant Medications   diltiazem  (CARDIZEM  CD) 120 MG 24 hr capsule   Other Relevant Orders   Comprehensive metabolic panel with GFR   Lipid panel   Vitamin D  deficiency   Check vitamin d  levels and treat based on results. She is not currently taking any supplements, but just finished weekly prescription.       Relevant Orders   VITAMIN D  25 Hydroxy (Vit-D Deficiency, Fractures)   Routine general medical examination at a health care facility - Primary   Health maintenance reviewed and updated. Discussed nutrition, exercise.  Follow-up 1 year.        Other Visit Diagnoses       Constipation, unspecified constipation type       Can take fiber supplement or colace daily and increase water     Encounter for hepatitis C screening test for low risk patient       Screen hepatitis C today   Relevant Orders   Hepatitis C antibody     Immunization due       Flu vaccine given today   Relevant Orders   Flu vaccine HIGH DOSE PF(Fluzone Trivalent) (Completed)        Follow up plan: Return in about 3 months (around 08/15/2024) for follow-up.   LABORATORY TESTING:  - Pap smear: not applicable  IMMUNIZATIONS:   - Tdap:  Tetanus vaccination status reviewed: last tetanus booster within 10 years. - Influenza: Administered today - Pneumovax: Up to date - Prevnar: Up to date - HPV: Not applicable - Shingrix vaccine: Declined  SCREENING: -Mammogram: Declined  - Colonoscopy: Not applicable  - Bone Density: Declined   PATIENT COUNSELING:   Advised to take 1 mg of folate supplement per day if capable of pregnancy.   Sexuality: Discussed sexually transmitted diseases, partner selection, use of condoms, avoidance of unintended pregnancy  and contraceptive alternatives.   Advised to avoid cigarette smoking.  I discussed with the patient that most people either abstain from alcohol or drink within safe limits (<=14/week and <=4 drinks/occasion for males, <=7/weeks and <= 3 drinks/occasion for females) and that the risk for alcohol disorders and other health effects rises proportionally with the number of drinks per week and how often a drinker exceeds daily limits.  Discussed cessation/primary prevention of drug use and availability of treatment for abuse.   Diet: Encouraged to adjust caloric intake to maintain  or achieve ideal body weight, to reduce intake of dietary saturated fat and total fat, to limit sodium intake by avoiding high sodium foods and not adding table salt, and to maintain adequate dietary potassium and calcium  preferably from fresh fruits, vegetables, and low-fat dairy products.    stressed the importance of regular exercise  Injury prevention: Discussed safety belts, safety helmets, smoke detector, smoking near bedding or upholstery.   Dental health: Discussed importance  of regular tooth brushing, flossing, and dental visits.    NEXT PREVENTATIVE PHYSICAL DUE IN 1 YEAR. Return in about 3 months (around 08/15/2024) for follow-up.  Lorine Iannaccone A Madden Piazza

## 2024-05-15 NOTE — Assessment & Plan Note (Signed)
 BMI 45.9. She has restarted ozempic  for diabetes and weight management.  Continue focus on healthy eating and walking as able.

## 2024-05-15 NOTE — Assessment & Plan Note (Signed)
 Chronic, stable. She declined taking lisinopril . Continue cardizem  120mg  daily. Check CMP, CBC, today.

## 2024-05-15 NOTE — Assessment & Plan Note (Signed)
Chronic, stable. Will have her continue diltiazem 120mg  daily. Continue collaboration and recommendations from cardiology.

## 2024-05-15 NOTE — Assessment & Plan Note (Signed)
 She declines statin. Check CMP, CBC, lipid panel today

## 2024-05-15 NOTE — Assessment & Plan Note (Signed)
 Health maintenance reviewed and updated. Discussed nutrition, exercise. Follow-up 1 year.

## 2024-05-15 NOTE — Assessment & Plan Note (Signed)
 Check vitamin d  levels and treat based on results. She is not currently taking any supplements, but just finished weekly prescription.

## 2024-05-15 NOTE — Patient Instructions (Signed)
 It was great to see you!  We are checking your labs today and will let you know the results via mychart/phone.   You can try colace and increase water to help with constipation   Let's follow-up in 3 months, sooner if you have concerns.  If a referral was placed today, you will be contacted for an appointment. Please note that routine referrals can sometimes take up to 3-4 weeks to process. Please call our office if you haven't heard anything after this time frame.  Take care,  Tinnie Harada, NP

## 2024-05-15 NOTE — Assessment & Plan Note (Signed)
 She is currently following with urology and is taking methenamine 1g BID. Continue collaboration and recommendations from urology.

## 2024-05-15 NOTE — Assessment & Plan Note (Signed)
Dexa scan in 2019 showed T score -2.5. She declines medication at this time. Check vitamin D today

## 2024-05-15 NOTE — Assessment & Plan Note (Signed)
 She experiences chronic pain and numbness in her leg and ankle post-back surgery, especially at night. She can increase gabapentin to 100-200mg  as needed daily.

## 2024-05-15 NOTE — Assessment & Plan Note (Signed)
 Chronic, stable. She still does have some shortness of breath with exertion. She has appointment with cardiology next month, encouraged her to keep this appointment.

## 2024-05-15 NOTE — Assessment & Plan Note (Signed)
 Chronic, stable. Continue ozempic  0.25mg  injection weekly for 1 more week, then increase to 0.5mg  weekly. Check CMP, CBC, A1c today. Requesting eye exam. Follow-up in 3 months.

## 2024-05-16 ENCOUNTER — Ambulatory Visit: Payer: Self-pay | Admitting: Nurse Practitioner

## 2024-05-16 LAB — HEPATITIS C ANTIBODY: Hepatitis C Ab: NONREACTIVE

## 2024-05-17 ENCOUNTER — Encounter: Payer: Self-pay | Admitting: Nurse Practitioner

## 2024-05-30 ENCOUNTER — Telehealth: Payer: Self-pay

## 2024-05-30 NOTE — Progress Notes (Signed)
   05/30/2024  Patient ID: [name], female   DOB: April 29, 1946, 78 y.o.   MRN: [id number]  This patient is appearing on a report for being at risk of failing the adherence measure for diabetes medications this calendar year.   Medication: Ozempic  0.25/0.5mg  Last fill date: 05/23/24 for 84 day supply  Insurance report was not up to date. No action needed at this time.   Channing DELENA Mealing, PharmD, DPLA

## 2024-05-31 ENCOUNTER — Ambulatory Visit: Admitting: Urology

## 2024-05-31 ENCOUNTER — Encounter: Payer: Self-pay | Admitting: Urology

## 2024-05-31 VITALS — BP 137/84 | HR 76

## 2024-05-31 DIAGNOSIS — N390 Urinary tract infection, site not specified: Secondary | ICD-10-CM | POA: Diagnosis not present

## 2024-05-31 DIAGNOSIS — R8290 Unspecified abnormal findings in urine: Secondary | ICD-10-CM

## 2024-05-31 DIAGNOSIS — N200 Calculus of kidney: Secondary | ICD-10-CM | POA: Diagnosis not present

## 2024-05-31 DIAGNOSIS — Z87440 Personal history of urinary (tract) infections: Secondary | ICD-10-CM

## 2024-05-31 LAB — URINALYSIS, ROUTINE W REFLEX MICROSCOPIC
Bilirubin, UA: NEGATIVE
Glucose, UA: NEGATIVE
Ketones, UA: NEGATIVE
Nitrite, UA: POSITIVE — AB
Specific Gravity, UA: 1.02 (ref 1.005–1.030)
Urobilinogen, Ur: 1 mg/dL (ref 0.2–1.0)
pH, UA: 6 (ref 5.0–7.5)

## 2024-05-31 LAB — MICROSCOPIC EXAMINATION: WBC, UA: >30 /HPF — AB (ref 0–5)

## 2024-05-31 MED ORDER — FLUCONAZOLE 100 MG PO TABS: 100.0000 mg | ORAL_TABLET | Freq: Every day | ORAL | 0 refills | Status: AC

## 2024-05-31 NOTE — Progress Notes (Signed)
 Assessment: 1. Recurrent UTI   2. Nephrolithiasis   3. Abnormal urine findings     Plan: Continue methods to reduce risk of UTIs including increase fluid intake, timed and double voiding, daily cranberry supplement, daily probiotic, vaginal hormone replacement, and daily antibiotic prophylaxis. Continue methenamine  BID Continue vaginal hormone cream 2-3 times per week.   Urine culture sent today. Will contact her with results. Will hold off on treatment since she is not symptomatic. Rx for fluconazole  100 mg daily x 3 days sent to pharmacy Return to office in 2 months  Chief Complaint:  Chief Complaint  Patient presents with   Recurrent UTI    History of Present Illness:  [name] is a 78 y.o. female who is seen for further evaluation of nephrolithiasis and recurrent UTIs.  Nephrolithiasis: She has a history of nephrolithiasis.  She has a history of medullary sponge kidney with multiple stones.  She has been treated with lithotripsy as well as ureteroscopy.  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  Prior evaluation with 24-hour urine showed no significant abnormalities.  Stone analysis showed calcium  oxalate and calcium  phosphate.  She underwent ureteroscopy for a large UPJ stone on the right in March 2023.  She subsequently developed a large right-sided hematoma.  She has had recurrent issues with UTIs since that time.  She has been treated for multiple UTIs.  She has also been on suppressive antibiotics with both Macrodantin  and trimethoprim . She underwent evaluation with a MRI in January 2024 which showed almost complete resolution of the right perinephric hematoma.  She was previously followed at [facility] and was last seen in July 2024. KUB from 7/24 showed no obvious calcifications in the area of the right renal shadow; multiple calcifications overlying the left renal shadow; no obvious ureteral calcifications. Renal  ultrasound showed no hydronephrosis, focal area of cortical thickening in the right kidney consistent with prior hematoma. CT abdomen pelvis without contrast from 03/12/2024 showed no right sided nephrolithiasis, right sided cortical thinning, multiple left-sided parenchymal stones predominantly localized to the mid pole, no hydronephrosis, no ureteral calculi.  Recurrent UTI's: Urine culture results: 4/25 >100 K E. Coli 5/25 >100 K E. Coli 8/25 >100 K E. Coli 8/25 >100 K E. coli  She was seen in August 2025 with a 2-week history of some left-sided flank and back pain.  Her pain was intermittent with increased symptoms at night.  She noted relief by taking ibuprofen and changing position.  No nausea or vomiting.  No gross hematuria.  She also noted some right sided flank pain.  She was not having any UTI symptoms at the time of her visit.  She was last treated for a UTI in early August 2025.  She was treated with Macrobid  x 10 days and started on daily Macrobid  for her recurrent UTIs.  At her visit in September 2025, she continued to have left-sided flank and back pain.  Her pain was worse at night and positional.  She had decreased pain with position changes.  She also noted improvement with a heating pad.  She continued on daily Macrobid .  She was not having any significant frequency, urgency, dysuria, or gross hematuria.  She reported vaginal irritation with wiping and intermittently after voiding.  She had been using antifungal cream vaginally without improvement. Resolve MDX urine culture from 04/05/2024 grew Enterococcus and E. coli.  She was treated with fosfomycin. Urine culture from 04/24/2024 grew 50-100 K  E. coli.  Treated with cefdinir  x 7 days.  She was started on methenamine  BID.  She returns today for follow-up.  She continues on methenamine  twice daily.  She reports cloudy urine but no dysuria.  She is not having any flank pain.  No fevers or chills.  No gross hematuria.  She does  report some vaginal itching as well as itching under her breast and arms.  Portions of the above documentation were copied from a prior visit for review purposes only.   Past Medical History:  Past Medical History:  Diagnosis Date   Acute hypoxemic respiratory failure (HCC) 10/24/2016   Arthritis    both knees   Carpal tunnel syndrome, bilateral    both hands go to sleep at times from elbows down   Chronic diastolic heart failure (HCC)    DDD (degenerative disc disease), lumbosacral    Diabetes mellitus without complication (HCC)    Dyspnea    during chronic bronchitis, resolved at this time   First degree AV block    Hematuria 11/15/2017   History of kidney stones    History of rib fracture 03/2011   Lumbar stenosis    Obesity    Osteoporosis    Postlaminectomy syndrome    Pre-diabetes    Renal calculi    BILATERAL   Right ureteral stone    Rotator cuff tear, left    Rotator cuff tear, right    UTI (urinary tract infection)     Past Surgical History:  Past Surgical History:  Procedure Laterality Date   ANTERIOR CERVICAL DECOMP/DISCECTOMY FUSION  03-18-2011   C3 -- C7   ANTERIOR LATERAL LUMBAR FUSION 4 LEVELS N/A 05/06/2018   Procedure: Lumbar one- two Lumbar two -three Lumbar  three-four Lumbar four-five Anterolateral decompression/fusion/posterior bilateral laminectomy Lumbar four-five Lumbar five Sacral one/posterior percutaneous fixation/mazor;  Surgeon: Colon Shove, MD;  Location: [facility];  Service: Neurosurgery;  Laterality: N/A;   APPENDECTOMY     APPLICATION OF ROBOTIC ASSISTANCE FOR SPINAL PROCEDURE N/A 05/06/2018   Procedure: APPLICATION OF ROBOTIC ASSISTANCE FOR SPINAL PROCEDURE;  Surgeon: Colon Shove, MD;  Location: [facility];  Service: Neurosurgery;  Laterality: N/A;   BILATERAL CARPAL TUNNEL RELEASE     3 screws in right hand, thumb, middle finger, and index finger,    CARDIAC CATHETERIZATION N/A 01/16/2015   Procedure: Left Heart Cath and Coronary  Angiography;  Surgeon: Shove LELON Sharps, MD;  Location: [facility] INVASIVE CV LAB;  Service: Cardiovascular;  Laterality: N/A;   CHOLECYSTECTOMY OPEN  1970'S   COLONOSCOPY  2018   CYSTOSCOPY WITH RETROGRADE PYELOGRAM, URETEROSCOPY AND STENT PLACEMENT N/A 02/13/2013   Procedure: CYSTOSCOPY WITH RETROGRADE PYELOGRAM, URETEROSCOPY AND LITHOTRIPSY ;  Surgeon: Mark C Ottelin, MD;  Location: [facility];  Service: Urology;  Laterality: N/A;   CYSTOSCOPY WITH URETEROSCOPY AND STENT PLACEMENT Left 04/17/2019   Procedure: CYSTOSCOPY WITH LEFT  RETROGRADE PYELOGRAM, LEFT URETEROSCOPY, HOLMIUM LASER AND LEFT STENT PLACEMENT;  Surgeon: Ottelin, Mark, MD;  Location: [facility];  Service: Urology;  Laterality: Left;   CYSTOSCOPY/URETEROSCOPY/HOLMIUM LASER/STENT PLACEMENT Right 11/22/2017   Procedure: CYSTOSCOPY/URETEROSCOPY/RETROGRADE PYELOGRAM/HOLMIUM LASER/STENT PLACEMENT;  Surgeon: Ottelin, Mark, MD;  Location: [facility];  Service: Urology;  Laterality: Right;   EXTRACORPOREAL SHOCK WAVE LITHOTRIPSY Right 05-16-2012   FINGER ARTHROSCOPY WITH CARPOMETACARPEL [facility]) ARTHROPLASTY     HOLMIUM LASER APPLICATION Right 02/13/2013   Procedure: HOLMIUM LASER APPLICATION;  Surgeon: Oneil JAYSON Rafter, MD;  Location: [facility];  Service: Urology;  Laterality: Right;   IR URETERAL STENT LEFT NEW ACCESS W/O SEP NEPHROSTOMY CATH  02/13/2019   KNEE ARTHROSCOPY Bilateral    LUMBAR LAMINECTOMY  X2  1960'S   LUMBAR PERCUTANEOUS PEDICLE SCREW 4 LEVEL N/A 05/06/2018   Procedure: LUMBAR PERCUTANEOUS PEDICLE SCREW LUMBAR ONE TO LUMBAR FIVE WITH MAZOR ;POSTERIOR LATERAL LAMINECTOMY LUMBAR FOUR-FIVE,FIVE-SACRAL ONE;  Surgeon: Colon Shove, MD;  Location: [facility];  Service: Neurosurgery;  Laterality: N/A;   NASAL SEPTUM SURGERY  1970'S   NEPHROLITHOTOMY Left 02/20/2019   Procedure: NEPHROLITHOTOMY PERCUTANEOUS;  Surgeon: Ottelin, Mark, MD;  Location: [facility];  Service: Urology;  Laterality: Left;   SHOULDER OPEN  ROTATOR CUFF REPAIR Bilateral LEFT 01-11-2001/   RIGHT 07-21-2002   TOTAL KNEE ARTHROPLASTY  04/13/2012   Procedure: TOTAL KNEE ARTHROPLASTY;  Surgeon: Dempsey LULLA Moan, MD;  Location: [facility];  Service: Orthopedics;  Laterality: Left;   TOTAL KNEE ARTHROPLASTY  08/08/2012   Procedure: TOTAL KNEE ARTHROPLASTY;  Surgeon: Dempsey LULLA Moan, MD;  Location: [facility];  Service: Orthopedics;  Laterality: Right;   TRANSTHORACIC ECHOCARDIOGRAM  03-29-2011   MODERATE LVH/ EF 60-65%/ MILD MR   URETEROSCOPY WITH HOLMIUM LASER LITHOTRIPSY Left 06/19/2019   Procedure: URETEROSCOPY WITH HOLMIUM LASER LITHOTRIPSY/INSERTION OF DOUBLE J STENT;  Surgeon: Ottelin, Mark, MD;  Location: [facility];  Service: Urology;  Laterality: Left;   VAGINAL HYSTERECTOMY  1960'S    Allergies:  Allergies  Allergen Reactions   Contrast Media [Iodinated Contrast Media] Hives and Rash    Omnipaque .  Rash, itching   Cipro  [Ciprofloxacin  Hcl] Nausea And Vomiting    Pt reports she thinks she is allergic and reports its caused her nausea, anxious, and weakness. Pt unsure.   Doxycycline Nausea And Vomiting and Other (See Comments)   Gabapentin  Other (See Comments)    Too drowsy   Metformin  Hcl Diarrhea and Other (See Comments)   Pregabalin Other (See Comments)    Too drowsy   Semaglutide (0.25 Or 0.5mg -Dos) Other (See Comments)    Stomach upset   Ancef  [Cefazolin ] Rash    Rash to ancef  vs omniopaue contrast   Augmentin  [Amoxicillin -Pot Clavulanate] Itching, Nausea And Vomiting and Rash    Did it involve swelling of the face/tongue/throat, SOB, or low BP? Unknown Did it involve sudden or severe rash/hives, skin peeling, or any reaction on the inside of your mouth or nose? Yes Did you need to seek medical attention at a hospital or doctor's office? No When did it last happen? 2020      If all above answers are "NO", may proceed with cephalosporin use.    Sulfa  Antibiotics Itching, Nausea And Vomiting, Rash and Other (See Comments)    Tape Swelling    Adhesive Tape    Family History:  Family History  Problem Relation Age of Onset   Lung cancer Mother    Diabetes type II Mother    Lung cancer Father    Diabetes Sister    Hypertension Sister    Hypertension Brother    Diabetes Brother    Heart disease Brother    Diabetes Brother    Hypertension Brother    Heart disease Brother     Social History:  Social History   Tobacco Use   Smoking status: Never   Smokeless tobacco: Never  Vaping Use   Vaping status: Never Used  Substance Use Topics   Alcohol use: No   Drug use: No    ROS: Constitutional:  Negative for fever, chills, weight loss CV: Negative  for chest pain, previous MI, hypertension Respiratory:  Negative for shortness of breath, wheezing, sleep apnea, frequent cough GI:  Negative for nausea, vomiting, bloody stool, GERD  Physical exam: BP 137/84   Pulse 76  GENERAL APPEARANCE:  Well appearing, well developed, well nourished, NAD HEENT:  Atraumatic, normocephalic, oropharynx clear NECK:  Supple without lymphadenopathy or thyromegaly ABDOMEN:  Soft, non-tender, no masses EXTREMITIES:  Moves all extremities well, without clubbing, cyanosis, or edema NEUROLOGIC:  Alert and oriented x 3, normal gait, CN II-XII grossly intact MENTAL STATUS:  appropriate BACK:  Non-tender to palpation, No CVAT SKIN:  Warm, dry, and intact  Results: U/A: >30 WBCs, 0-2 RBCs, many bacteria, nitrite positive

## 2024-06-03 LAB — URINE CULTURE

## 2024-06-05 ENCOUNTER — Other Ambulatory Visit: Payer: Self-pay

## 2024-06-05 ENCOUNTER — Ambulatory Visit: Payer: Self-pay | Admitting: Urology

## 2024-06-05 ENCOUNTER — Other Ambulatory Visit (HOSPITAL_COMMUNITY): Payer: Self-pay

## 2024-06-05 DIAGNOSIS — R8290 Unspecified abnormal findings in urine: Secondary | ICD-10-CM

## 2024-06-05 MED ORDER — NITROFURANTOIN MONOHYD MACRO 100 MG PO CAPS
100.0000 mg | ORAL_CAPSULE | Freq: Two times a day (BID) | ORAL | 0 refills | Status: AC
  Filled 2024-06-05: qty 14, 7d supply, fill #0

## 2024-06-06 ENCOUNTER — Other Ambulatory Visit: Payer: Self-pay

## 2024-06-07 NOTE — Progress Notes (Deleted)
 Cardiology Office Note:    Date:  06/07/2024   ID:  [name], DOB 1945-12-20, MRN [id number]  PCP:  Nedra Tinnie LABOR, NP   [facility] Providers Cardiologist:  Elsy Chiang, MD     Referring MD: Nedra Tinnie LABOR, NP   No chief complaint on file.   History of Present Illness:    [name] is a 78 y.o. female with a hx of paroxysmal atrial fibrillation, first-degree AV block, chronic diastolic heart failure, hyperlipidemia, DDD, and type 2 diabetes who presents for follow-up related to atrial fibrillation. She was formerly a patient of Dr Victory Sharps. Cardiac catheterization in 2016 in the setting of false positive myocardial perfusion study showed normal coronary arteries.  Echocardiogram in 10/2016 showed EF 55 to 60%, mild concentric LVH, no RWMA, G1 DD.  Cardiac monitor in 03/2021 revealed high burden of SVT, self-terminating, possible atrial fibrillation, no sustained A-fib.  She has been stable on diltiazem , not on anticoagulation.  She was last seen in the office on 03/30/2022 and was stable overall from a cardiac standpoint.  It was noted that should she have recurrent atrial fibrillation she would require chronic anticoagulation therapy.   She was seen in Feb 2024 with increased dyspnea and leg swelling. She was placed on lasix  20 mg daily. BNP was normal. Echo was normal. She was maintaining NSR.   On follow up she is doing well. Only took lasix  for a few days and reports swelling went away. No dyspnea or chest pain. Not aware of SVT. No bleeding.    Past Medical History:  Diagnosis Date   Acute hypoxemic respiratory failure (HCC) 10/24/2016   Arthritis    both knees   Carpal tunnel syndrome, bilateral    both hands go to sleep at times from elbows down   Chronic diastolic heart failure (HCC)    DDD (degenerative disc disease), lumbosacral    Diabetes mellitus without complication (HCC)    Dyspnea    during chronic bronchitis, resolved at this time    First degree AV block    Hematuria 11/15/2017   History of kidney stones    History of rib fracture 03/2011   Lumbar stenosis    Obesity    Osteoporosis    Postlaminectomy syndrome    Pre-diabetes    Renal calculi    BILATERAL   Right ureteral stone    Rotator cuff tear, left    Rotator cuff tear, right    UTI (urinary tract infection)     Past Surgical History:  Procedure Laterality Date   ANTERIOR CERVICAL DECOMP/DISCECTOMY FUSION  03-18-2011   C3 -- C7   ANTERIOR LATERAL LUMBAR FUSION 4 LEVELS N/A 05/06/2018   Procedure: Lumbar one- two Lumbar two -three Lumbar  three-four Lumbar four-five Anterolateral decompression/fusion/posterior bilateral laminectomy Lumbar four-five Lumbar five Sacral one/posterior percutaneous fixation/mazor;  Surgeon: Colon Victory, MD;  Location: [facility];  Service: Neurosurgery;  Laterality: N/A;   APPENDECTOMY     APPLICATION OF ROBOTIC ASSISTANCE FOR SPINAL PROCEDURE N/A 05/06/2018   Procedure: APPLICATION OF ROBOTIC ASSISTANCE FOR SPINAL PROCEDURE;  Surgeon: Colon Victory, MD;  Location: [facility];  Service: Neurosurgery;  Laterality: N/A;   BILATERAL CARPAL TUNNEL RELEASE     3 screws in right hand, thumb, middle finger, and index finger,    CARDIAC CATHETERIZATION N/A 01/16/2015   Procedure: Left Heart Cath and Coronary Angiography;  Surgeon: Victory LELON Sharps, MD;  Location: [facility] INVASIVE CV LAB;  Service: Cardiovascular;  Laterality: N/A;   CHOLECYSTECTOMY OPEN  1970'S   COLONOSCOPY  2018   CYSTOSCOPY WITH RETROGRADE PYELOGRAM, URETEROSCOPY AND STENT PLACEMENT N/A 02/13/2013   Procedure: CYSTOSCOPY WITH RETROGRADE PYELOGRAM, URETEROSCOPY AND LITHOTRIPSY ;  Surgeon: Mark C Ottelin, MD;  Location: [facility];  Service: Urology;  Laterality: N/A;   CYSTOSCOPY WITH URETEROSCOPY AND STENT PLACEMENT Left 04/17/2019   Procedure: CYSTOSCOPY WITH LEFT  RETROGRADE PYELOGRAM, LEFT URETEROSCOPY, HOLMIUM LASER AND LEFT STENT PLACEMENT;  Surgeon: Ottelin, Mark,  MD;  Location[facility];  Service: Urology;  Laterality: Left;   CYSTOSCOPY/URETEROSCOPY/HOLMIUM LASER/STENT PLACEMENT Right 11/22/2017   Procedure: CYSTOSCOPY/URETEROSCOPY/RETROGRADE PYELOGRAM/HOLMIUM LASER/STENT PLACEMENT;  Surgeon: Ottelin, Mark, MD;  Location: [facility];  Service: Urology;  Laterality: Right;   EXTRACORPOREAL SHOCK WAVE LITHOTRIPSY Right 05-16-2012   FINGER ARTHROSCOPY WITH CARPOMETACARPEL [facility]) ARTHROPLASTY     HOLMIUM LASER APPLICATION Right 02/13/2013   Procedure: HOLMIUM LASER APPLICATION;  Surgeon: Oneil JAYSON Rafter, MD;  Location: [facility];  Service: Urology;  Laterality: Right;   IR URETERAL STENT LEFT NEW ACCESS W/O SEP NEPHROSTOMY CATH  02/13/2019   KNEE ARTHROSCOPY Bilateral    LUMBAR LAMINECTOMY  X2  1960'S   LUMBAR PERCUTANEOUS PEDICLE SCREW 4 LEVEL N/A 05/06/2018   Procedure: LUMBAR PERCUTANEOUS PEDICLE SCREW LUMBAR ONE TO LUMBAR FIVE WITH MAZOR ;POSTERIOR LATERAL LAMINECTOMY LUMBAR FOUR-FIVE,FIVE-SACRAL ONE;  Surgeon: Colon Shove, MD;  Location: [facility];  Service: Neurosurgery;  Laterality: N/A;   NASAL SEPTUM SURGERY  1970'S   NEPHROLITHOTOMY Left 02/20/2019   Procedure: NEPHROLITHOTOMY PERCUTANEOUS;  Surgeon: Ottelin, Mark, MD;  Location: [facility];  Service: Urology;  Laterality: Left;   SHOULDER OPEN ROTATOR CUFF REPAIR Bilateral LEFT 01-11-2001/   RIGHT 07-21-2002   TOTAL KNEE ARTHROPLASTY  04/13/2012   Procedure: TOTAL KNEE ARTHROPLASTY;  Surgeon: Dempsey LULLA Moan, MD;  Location: [facility];  Service: Orthopedics;  Laterality: Left;   TOTAL KNEE ARTHROPLASTY  08/08/2012   Procedure: TOTAL KNEE ARTHROPLASTY;  Surgeon: Dempsey LULLA Moan, MD;  Location: [facility];  Service: Orthopedics;  Laterality: Right;   TRANSTHORACIC ECHOCARDIOGRAM  03-29-2011   MODERATE LVH/ EF 60-65%/ MILD MR   URETEROSCOPY WITH HOLMIUM LASER LITHOTRIPSY Left 06/19/2019   Procedure: URETEROSCOPY WITH HOLMIUM LASER LITHOTRIPSY/INSERTION OF DOUBLE J STENT;  Surgeon: Ottelin,  Mark, MD;  Location: [facility];  Service: Urology;  Laterality: Left;   VAGINAL HYSTERECTOMY  1960'S    Current Medications: No outpatient medications have been marked as taking for the 06/13/24 encounter (Appointment) with Anistyn Graddy M, MD.     Allergies:   Contrast media [iodinated contrast media], Cipro  [ciprofloxacin  hcl], Doxycycline, Gabapentin , Metformin  hcl, Pregabalin, Semaglutide (0.25 or 0.5mg -dos), Ancef  [cefazolin ], Augmentin  [amoxicillin -pot clavulanate], Sulfa  antibiotics, and Tape   Social History   Socioeconomic History   Marital status: Married    Spouse name: Not on file   Number of children: Not on file   Years of education: Not on file   Highest education level: Associate degree: occupational, scientist, product/process development, or vocational program  Occupational History   Not on file  Tobacco Use   Smoking status: Never   Smokeless tobacco: Never  Vaping Use   Vaping status: Never Used  Substance and Sexual Activity   Alcohol use: No   Drug use: No   Sexual activity: Yes    Partners: Male    Birth control/protection: Surgical  Other Topics Concern   Not on file  Social History Narrative   Not on file   Social Drivers of Health  Financial Resource Strain: Low Risk  (02/28/2024)   Overall Financial Resource Strain (CARDIA)    Difficulty of Paying Living Expenses: Not very hard  Food Insecurity: No Food Insecurity (02/28/2024)   Hunger Vital Sign    Worried About Running Out of Food in the Last Year: Never true    Ran Out of Food in the Last Year: Never true  Transportation Needs: No Transportation Needs (02/28/2024)   PRAPARE - Administrator, Civil Service (Medical): No    Lack of Transportation (Non-Medical): No  Physical Activity: Inactive (02/28/2024)   Exercise Vital Sign    Days of Exercise per Week: 0 days    Minutes of Exercise per Session: Not on file  Stress: No Stress Concern Present (02/28/2024)   [employer] of Occupational Health -  Occupational Stress Questionnaire    Feeling of Stress: Not at all  Social Connections: Socially Integrated (02/28/2024)   Social Connection and Isolation Panel    Frequency of Communication with Friends and Family: More than three times a week    Frequency of Social Gatherings with Friends and Family: Once a week    Attends Religious Services: More than 4 times per year    Active Member of [employer] or Organizations: Yes    Attends Engineer, Structural: More than 4 times per year    Marital Status: Married     Family History: The patient's family history includes Diabetes in her brother, brother, and sister; Diabetes type II in her mother; Heart disease in her brother and brother; Hypertension in her brother, brother, and sister; Lung cancer in her father and mother.  ROS:   Please see the history of present illness.     All other systems reviewed and are negative.  EKGs/Labs/Other Studies Reviewed:    The following studies were reviewed today: Cardiac monitor 03/2021: Normal sinus rhythm Frequent salvos of SVT with fastest 14 beats at 171 bpm SVT longest at [facility] HR 114 bpm lasting 1 minute 45 seconds. R-R variability prevents exclusion of AF No symptoms reported.   High burden of SVT salvos (brief), self terminating and unable to exclude AF on a few. No sustained AF noted.   Echo 10/2016: Study Conclusions   - Left ventricle: The cavity size was normal. There was mild    concentric hypertrophy. Systolic function was normal. The    estimated ejection fraction was in the range of 55% to 60%. Wall    motion was normal; there were no regional wall motion    abnormalities. Doppler parameters are consistent with abnormal    left ventricular relaxation (grade 1 diastolic dysfunction).  - Mitral valve: Valve area by pressure half-time: 1.76 cm^2.  - Left atrium: The atrium was mildly to moderately dilated.   LHC 01/2015: Normal coronary arteries Normal left ventricular systolic  function False positive myocardial perfusion study     RECOMMENDATIONS: No further ischemic evaluation is necessary   Echo 09/09/22: IMPRESSIONS     1. Left ventricular ejection fraction, by estimation, is 60 to 65%. The  left ventricle has normal function. The left ventricle has no regional  wall motion abnormalities. Left ventricular diastolic parameters were  normal.   2. Right ventricular systolic function is normal. The right ventricular  size is normal.   3. The mitral valve is normal in structure. Mild mitral valve  regurgitation. No evidence of mitral stenosis.   4. The aortic valve is normal in structure. Aortic valve regurgitation is  not  visualized. No aortic stenosis is present.   5. The inferior vena cava is normal in size with greater than 50%  respiratory variability, suggesting right atrial pressure of 3 mmHg.    EKG:  EKG is not ordered today.  The ekg ordered today demonstrates N/A  Recent Labs: 11/09/2023: Magnesium  2.0 02/18/2024: Hemoglobin 13.0; Platelets 531.0; Pro B Natriuretic peptide (BNP) 67.0; TSH 1.46 05/15/2024: ALT 14; BUN 20; Creatinine, Ser 0.86; Potassium 4.0; Sodium 138  Recent Lipid Panel    Component Value Date/Time   CHOL 193 05/15/2024 0944   TRIG 107.0 05/15/2024 0944   HDL 78.60 05/15/2024 0944   CHOLHDL 2 05/15/2024 0944   VLDL 21.4 05/15/2024 0944   LDLCALC 93 05/15/2024 0944   LDLCALC 113 (H) 05/01/2022 1447     Risk Assessment/Calculations:      No BP recorded.  {Refresh Note OR Click here to enter BP  :1}***         Physical Exam:    VS:  There were no vitals taken for this visit.    Wt Readings from Last 3 Encounters:  05/15/24 251 lb 3.2 oz (113.9 kg)  03/09/24 250 lb (113.4 kg)  03/03/24 254 lb 12.8 oz (115.6 kg)     GEN: Well nourished, obese in no acute distress HEENT: Normal NECK: No JVD; No carotid bruits LYMPHATICS: No lymphadenopathy CARDIAC: RRR, no murmurs, rubs, gallops RESPIRATORY:  Clear to  auscultation without rales, wheezing or rhonchi  ABDOMEN: Soft, non-tender, non-distended MUSCULOSKELETAL:  No edema; No deformity  SKIN: Warm and dry NEUROLOGIC:  Alert and oriented x 3 PSYCHIATRIC:  Normal affect   ASSESSMENT:    No diagnosis found.  PLAN:    In order of problems listed above:  Leg swelling is doing well. Normal Echo and BNP. Can use lasix  PrN HTN controlled.  SVT well controlled on Diltiazem . No documented Afib            Medication Adjustments/Labs and Tests Ordered: Current medicines are reviewed at length with the patient today.  Concerns regarding medicines are outlined above.  No orders of the defined types were placed in this encounter.  No orders of the defined types were placed in this encounter.   There are no Patient Instructions on file for this visit.   Signed, Jerrelle Michelsen, MD  06/07/2024 7:56 AM    [facility]

## 2024-06-13 ENCOUNTER — Ambulatory Visit: Admitting: Cardiology

## 2024-07-31 ENCOUNTER — Encounter: Payer: Self-pay | Admitting: Urology

## 2024-07-31 ENCOUNTER — Ambulatory Visit: Admitting: Urology

## 2024-07-31 VITALS — BP 131/84 | HR 76 | Ht 62.0 in | Wt 239.0 lb

## 2024-07-31 DIAGNOSIS — N200 Calculus of kidney: Secondary | ICD-10-CM

## 2024-07-31 DIAGNOSIS — N390 Urinary tract infection, site not specified: Secondary | ICD-10-CM

## 2024-07-31 DIAGNOSIS — R8271 Bacteriuria: Secondary | ICD-10-CM | POA: Diagnosis not present

## 2024-07-31 DIAGNOSIS — Z87440 Personal history of urinary (tract) infections: Secondary | ICD-10-CM | POA: Diagnosis not present

## 2024-07-31 LAB — URINALYSIS, ROUTINE W REFLEX MICROSCOPIC
Bilirubin, UA: NEGATIVE
Glucose, UA: NEGATIVE
Nitrite, UA: POSITIVE — AB
Specific Gravity, UA: 1.02 (ref 1.005–1.030)
Urobilinogen, Ur: 2 mg/dL — ABNORMAL HIGH (ref 0.2–1.0)
pH, UA: 6 (ref 5.0–7.5)

## 2024-07-31 LAB — MICROSCOPIC EXAMINATION: WBC, UA: >30 /HPF — AB (ref 0–5)

## 2024-07-31 MED ORDER — NITROFURANTOIN MONOHYD MACRO 100 MG PO CAPS: 100.0000 mg | ORAL_CAPSULE | Freq: Two times a day (BID) | ORAL | 0 refills | Status: AC

## 2024-07-31 NOTE — Progress Notes (Signed)
 "  Assessment: 1. Asymptomatic bacteriuria   2. Recurrent UTI   3. Nephrolithiasis     Plan: Continue methods to reduce risk of UTIs including increase fluid intake, timed and double voiding, daily cranberry supplement, daily probiotic, vaginal hormone replacement, and daily antibiotic prophylaxis. Continue methenamine  BID Continue vaginal hormone cream 2-3 times per week.   Will hold off on treatment since she is not symptomatic. Rx for Macrobid  x 7 days provided for prn use. Return to office in 4 months  Chief Complaint:  Chief Complaint  Patient presents with   Recurrent UTI    History of Present Illness:  [name] is a 79 y.o. female who is seen for further evaluation of nephrolithiasis and recurrent UTIs.  Nephrolithiasis: She has a history of nephrolithiasis.  She has a history of medullary sponge kidney with multiple stones.  She has been treated with lithotripsy as well as ureteroscopy.  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  Prior evaluation with 24-hour urine showed no significant abnormalities.  Stone analysis showed calcium  oxalate and calcium  phosphate.  She underwent ureteroscopy for a large UPJ stone on the right in March 2023.  She subsequently developed a large right-sided hematoma.  She has had recurrent issues with UTIs since that time.  She has been treated for multiple UTIs.  She had also been on suppressive antibiotics with both Macrodantin  and trimethoprim . She underwent evaluation with a MRI in January 2024 which showed almost complete resolution of the right perinephric hematoma.  She was previously followed at [facility] and was last seen in July 2024. KUB from 7/24 showed no obvious calcifications in the area of the right renal shadow; multiple calcifications overlying the left renal shadow; no obvious ureteral calcifications. Renal ultrasound showed no hydronephrosis, focal area of cortical thickening in the  right kidney consistent with prior hematoma. CT abdomen pelvis without contrast from 03/12/2024 showed no right sided nephrolithiasis, right sided cortical thinning, multiple left-sided parenchymal stones predominantly localized to the mid pole, no hydronephrosis, no ureteral calculi.  Recurrent UTI's: Urine culture results: 4/25 >100 K E. Coli 5/25 >100 K E. Coli 8/25 >100 K E. Coli 8/25 >100 K E. coli  She was seen in August 2025 with a 2-week history of some left-sided flank and back pain.  Her pain was intermittent with increased symptoms at night.  She noted relief by taking ibuprofen and changing position.  No nausea or vomiting.  No gross hematuria.  She also noted some right sided flank pain.  She was not having any UTI symptoms at the time of her visit.  She was last treated for a UTI in early August 2025.  She was treated with Macrobid  x 10 days and started on daily Macrobid  for her recurrent UTIs.  At her visit in September 2025, she continued to have left-sided flank and back pain.  Her pain was worse at night and positional.  She had decreased pain with position changes.  She also noted improvement with a heating pad.  She continued on daily Macrobid .  She was not having any significant frequency, urgency, dysuria, or gross hematuria.  She reported vaginal irritation with wiping and intermittently after voiding.  She had been using antifungal cream vaginally without improvement. Resolve MDX urine culture from 04/05/2024 grew Enterococcus and E. coli.  She was treated with fosfomycin . Urine culture from 04/24/2024 grew 50-100 K E. coli.  Treated with cefdinir  x 7 days.  She  was started on methenamine  BID.  At her visit in November 2025, she continued on methenamine  twice daily.  She reported cloudy urine but no dysuria.  She was not having any flank pain.  No fevers or chills.  No gross hematuria.  She had some vaginal itching as well as itching under her breast and arms. Urine culture  grew >100 K E. coli.  She was given a prescription for Macrobid  x 7 days if she became symptomatic.  She returns today for follow-up.  She continues on methenamine  twice daily.  She has not had any significant UTI symptoms.  No dysuria or gross hematuria.  No flank pain.  She does report intermittently cloudy urine.  Portions of the above documentation were copied from a prior visit for review purposes only.   Past Medical History:  Past Medical History:  Diagnosis Date   Acute hypoxemic respiratory failure (HCC) 10/24/2016   Arthritis    both knees   Carpal tunnel syndrome, bilateral    both hands go to sleep at times from elbows down   Chronic diastolic heart failure (HCC)    DDD (degenerative disc disease), lumbosacral    Diabetes mellitus without complication (HCC)    Dyspnea    during chronic bronchitis, resolved at this time   First degree AV block    Hematuria 11/15/2017   History of kidney stones    History of rib fracture 03/2011   Lumbar stenosis    Obesity    Osteoporosis    Postlaminectomy syndrome    Pre-diabetes    Renal calculi    BILATERAL   Right ureteral stone    Rotator cuff tear, left    Rotator cuff tear, right    UTI (urinary tract infection)     Past Surgical History:  Past Surgical History:  Procedure Laterality Date   ANTERIOR CERVICAL DECOMP/DISCECTOMY FUSION  03-18-2011   C3 -- C7   ANTERIOR LATERAL LUMBAR FUSION 4 LEVELS N/A 05/06/2018   Procedure: Lumbar one- two Lumbar two -three Lumbar  three-four Lumbar four-five Anterolateral decompression/fusion/posterior bilateral laminectomy Lumbar four-five Lumbar five Sacral one/posterior percutaneous fixation/mazor;  Surgeon: Colon Shove, MD;  Location: [facility];  Service: Neurosurgery;  Laterality: N/A;   APPENDECTOMY     APPLICATION OF ROBOTIC ASSISTANCE FOR SPINAL PROCEDURE N/A 05/06/2018   Procedure: APPLICATION OF ROBOTIC ASSISTANCE FOR SPINAL PROCEDURE;  Surgeon: Colon Shove, MD;  Location:  [facility];  Service: Neurosurgery;  Laterality: N/A;   BILATERAL CARPAL TUNNEL RELEASE     3 screws in right hand, thumb, middle finger, and index finger,    CARDIAC CATHETERIZATION N/A 01/16/2015   Procedure: Left Heart Cath and Coronary Angiography;  Surgeon: Shove LELON Sharps, MD;  Location: [facility] INVASIVE CV LAB;  Service: Cardiovascular;  Laterality: N/A;   CHOLECYSTECTOMY OPEN  1970'S   COLONOSCOPY  2018   CYSTOSCOPY WITH RETROGRADE PYELOGRAM, URETEROSCOPY AND STENT PLACEMENT N/A 02/13/2013   Procedure: CYSTOSCOPY WITH RETROGRADE PYELOGRAM, URETEROSCOPY AND LITHOTRIPSY ;  Surgeon: Mark C Ottelin, MD;  Location: [facility];  Service: Urology;  Laterality: N/A;   CYSTOSCOPY WITH URETEROSCOPY AND STENT PLACEMENT Left 04/17/2019   Procedure: CYSTOSCOPY WITH LEFT  RETROGRADE PYELOGRAM, LEFT URETEROSCOPY, HOLMIUM LASER AND LEFT STENT PLACEMENT;  Surgeon: Ottelin, Mark, MD;  Location: [facility];  Service: Urology;  Laterality: Left;   CYSTOSCOPY/URETEROSCOPY/HOLMIUM LASER/STENT PLACEMENT Right 11/22/2017   Procedure: CYSTOSCOPY/URETEROSCOPY/RETROGRADE PYELOGRAM/HOLMIUM LASER/STENT PLACEMENT;  Surgeon: Ottelin, Mark, MD;  Location: [facility];  Service: Urology;  Laterality: Right;   EXTRACORPOREAL SHOCK WAVE LITHOTRIPSY Right 05-16-2012   FINGER ARTHROSCOPY WITH [facility]ARPEL [facility]) ARTHROPLASTY     HOLMIUM LASER APPLICATION Right 02/13/2013   Procedure: HOLMIUM LASER APPLICATION;  Surgeon: Oneil JAYSON Rafter, MD;  Location: [facility];  Service: Urology;  Laterality: Right;   IR URETERAL STENT LEFT NEW ACCESS W/O SEP NEPHROSTOMY CATH  02/13/2019   KNEE ARTHROSCOPY Bilateral    LUMBAR LAMINECTOMY  X2  1960'S   LUMBAR PERCUTANEOUS PEDICLE SCREW 4 LEVEL N/A 05/06/2018   Procedure: LUMBAR PERCUTANEOUS PEDICLE SCREW LUMBAR ONE TO LUMBAR FIVE WITH MAZOR ;POSTERIOR LATERAL LAMINECTOMY LUMBAR FOUR-FIVE,FIVE-SACRAL ONE;  Surgeon: Colon Shove, MD;  Location: [facility];  Service:  Neurosurgery;  Laterality: N/A;   NASAL SEPTUM SURGERY  1970'S   NEPHROLITHOTOMY Left 02/20/2019   Procedure: NEPHROLITHOTOMY PERCUTANEOUS;  Surgeon: Ottelin, Mark, MD;  Location: [facility];  Service: Urology;  Laterality: Left;   SHOULDER OPEN ROTATOR CUFF REPAIR Bilateral LEFT 01-11-2001/   RIGHT 07-21-2002   TOTAL KNEE ARTHROPLASTY  04/13/2012   Procedure: TOTAL KNEE ARTHROPLASTY;  Surgeon: Dempsey LULLA Moan, MD;  Location: [facility];  Service: Orthopedics;  Laterality: Left;   TOTAL KNEE ARTHROPLASTY  08/08/2012   Procedure: TOTAL KNEE ARTHROPLASTY;  Surgeon: Dempsey LULLA Moan, MD;  Location: [facility];  Service: Orthopedics;  Laterality: Right;   TRANSTHORACIC ECHOCARDIOGRAM  03-29-2011   MODERATE LVH/ EF 60-65%/ MILD MR   URETEROSCOPY WITH HOLMIUM LASER LITHOTRIPSY Left 06/19/2019   Procedure: URETEROSCOPY WITH HOLMIUM LASER LITHOTRIPSY/INSERTION OF DOUBLE J STENT;  Surgeon: Ottelin, Mark, MD;  Location: [facility];  Service: Urology;  Laterality: Left;   VAGINAL HYSTERECTOMY  1960'S    Allergies:  Allergies  Allergen Reactions   Contrast Media [Iodinated Contrast Media] Hives and Rash    Omnipaque .  Rash, itching   Cipro  [Ciprofloxacin  Hcl] Nausea And Vomiting    Pt reports she thinks she is allergic and reports its caused her nausea, anxious, and weakness. Pt unsure.   Doxycycline Nausea And Vomiting and Other (See Comments)   Gabapentin  Other (See Comments)    Too drowsy   Metformin  Hcl Diarrhea and Other (See Comments)   Pregabalin Other (See Comments)    Too drowsy   Semaglutide (0.25 Or 0.5mg -Dos) Other (See Comments)    Stomach upset   Ancef  [Cefazolin ] Rash    Rash to ancef  vs omniopaue contrast   Augmentin  [Amoxicillin -Pot Clavulanate] Itching, Nausea And Vomiting and Rash    Did it involve swelling of the face/tongue/throat, SOB, or low BP? Unknown Did it involve sudden or severe rash/hives, skin peeling, or any reaction on the inside of your mouth or nose? Yes Did you need to seek  medical attention at a hospital or doctor's office? No When did it last happen? 2020      If all above answers are NO, may proceed with cephalosporin use.    Sulfa  Antibiotics Itching, Nausea And Vomiting, Rash and Other (See Comments)   Tape Swelling    Adhesive Tape    Family History:  Family History  Problem Relation Age of Onset   Lung cancer Mother    Diabetes type II Mother    Lung cancer Father    Diabetes Sister    Hypertension Sister    Hypertension Brother    Diabetes Brother    Heart disease Brother    Diabetes Brother    Hypertension Brother    Heart disease Brother     Social History:  Social History  Tobacco Use   Smoking status: Never   Smokeless tobacco: Never  Vaping Use   Vaping status: Never Used  Substance Use Topics   Alcohol use: No   Drug use: No    ROS: Constitutional:  Negative for fever, chills, weight loss CV: Negative for chest pain, previous MI, hypertension Respiratory:  Negative for shortness of breath, wheezing, sleep apnea, frequent cough GI:  Negative for nausea, vomiting, bloody stool, GERD  Physical exam: BP 131/84   Pulse 76   Ht 5' 2 (1.575 m)   Wt 239 lb (108.4 kg)   BMI 43.71 kg/m  GENERAL APPEARANCE:  Well appearing, well developed, well nourished, NAD HEENT:  Atraumatic, normocephalic, oropharynx clear NECK:  Supple without lymphadenopathy or thyromegaly ABDOMEN:  Soft, non-tender, no masses EXTREMITIES:  Moves all extremities well, without clubbing, cyanosis, or edema NEUROLOGIC:  Alert and oriented x 3, normal gait, CN II-XII grossly intact MENTAL STATUS:  appropriate BACK:  Non-tender to palpation, No CVAT SKIN:  Warm, dry, and intact  Results: U/A: >30 WBCs, 3-10 RBCs, many bacteria, nitrite positive "

## 2024-08-18 ENCOUNTER — Other Ambulatory Visit: Payer: Self-pay | Admitting: Nurse Practitioner

## 2024-08-25 ENCOUNTER — Ambulatory Visit: Admitting: Cardiology

## 2024-09-01 ENCOUNTER — Ambulatory Visit: Admitting: Nurse Practitioner

## 2024-12-01 ENCOUNTER — Ambulatory Visit: Admitting: Urology
